# Patient Record
Sex: Female | Born: 1950 | Race: White | Hispanic: No | Smoking: Former smoker
Health system: Southern US, Community
[De-identification: ages and names within clinical notes are randomized; demographics above are authoritative.]

## PROBLEM LIST (undated history)

## (undated) DIAGNOSIS — F329 Major depressive disorder, single episode, unspecified: Secondary | ICD-10-CM

## (undated) DIAGNOSIS — B0221 Postherpetic geniculate ganglionitis: Secondary | ICD-10-CM

## (undated) DIAGNOSIS — I10 Essential (primary) hypertension: Secondary | ICD-10-CM

## (undated) DIAGNOSIS — E119 Type 2 diabetes mellitus without complications: Secondary | ICD-10-CM

## (undated) DIAGNOSIS — G43909 Migraine, unspecified, not intractable, without status migrainosus: Secondary | ICD-10-CM

## (undated) DIAGNOSIS — J449 Chronic obstructive pulmonary disease, unspecified: Secondary | ICD-10-CM

## (undated) HISTORY — DX: Major depressive disorder, single episode, unspecified: F32.9

## (undated) HISTORY — PX: LUMBAR PERITONEAL SHUNT: SHX1984

## (undated) HISTORY — PX: TYMPANOSTOMY TUBE PLACEMENT: SHX32

## (undated) HISTORY — DX: Chronic obstructive pulmonary disease, unspecified: J44.9

---

## 1972-09-11 DIAGNOSIS — F32A Depression, unspecified: Secondary | ICD-10-CM

## 1972-09-11 HISTORY — DX: Depression, unspecified: F32.A

## 2011-05-05 DIAGNOSIS — R918 Other nonspecific abnormal finding of lung field: Secondary | ICD-10-CM | POA: Insufficient documentation

## 2011-05-24 DIAGNOSIS — I10 Essential (primary) hypertension: Secondary | ICD-10-CM | POA: Insufficient documentation

## 2011-05-24 DIAGNOSIS — E119 Type 2 diabetes mellitus without complications: Secondary | ICD-10-CM | POA: Insufficient documentation

## 2011-06-05 DIAGNOSIS — J42 Unspecified chronic bronchitis: Secondary | ICD-10-CM | POA: Insufficient documentation

## 2011-09-27 DIAGNOSIS — G932 Benign intracranial hypertension: Secondary | ICD-10-CM | POA: Insufficient documentation

## 2011-09-27 DIAGNOSIS — E878 Other disorders of electrolyte and fluid balance, not elsewhere classified: Secondary | ICD-10-CM | POA: Insufficient documentation

## 2011-10-12 DIAGNOSIS — J309 Allergic rhinitis, unspecified: Secondary | ICD-10-CM | POA: Insufficient documentation

## 2011-12-12 DIAGNOSIS — B0222 Postherpetic trigeminal neuralgia: Secondary | ICD-10-CM | POA: Insufficient documentation

## 2011-12-22 DIAGNOSIS — R042 Hemoptysis: Secondary | ICD-10-CM | POA: Insufficient documentation

## 2012-03-28 DIAGNOSIS — E669 Obesity, unspecified: Secondary | ICD-10-CM | POA: Insufficient documentation

## 2012-04-08 DIAGNOSIS — H9012 Conductive hearing loss, unilateral, left ear, with unrestricted hearing on the contralateral side: Secondary | ICD-10-CM | POA: Insufficient documentation

## 2012-05-08 DIAGNOSIS — G245 Blepharospasm: Secondary | ICD-10-CM | POA: Insufficient documentation

## 2012-07-31 DIAGNOSIS — G510 Bell's palsy: Secondary | ICD-10-CM | POA: Insufficient documentation

## 2013-04-08 DIAGNOSIS — M6208 Separation of muscle (nontraumatic), other site: Secondary | ICD-10-CM | POA: Insufficient documentation

## 2013-11-17 DIAGNOSIS — L301 Dyshidrosis [pompholyx]: Secondary | ICD-10-CM | POA: Insufficient documentation

## 2013-12-02 DIAGNOSIS — Z800 Family history of malignant neoplasm of digestive organs: Secondary | ICD-10-CM | POA: Insufficient documentation

## 2014-03-13 DIAGNOSIS — M4310 Spondylolisthesis, site unspecified: Secondary | ICD-10-CM | POA: Insufficient documentation

## 2014-03-13 DIAGNOSIS — M8580 Other specified disorders of bone density and structure, unspecified site: Secondary | ICD-10-CM | POA: Insufficient documentation

## 2014-03-24 DIAGNOSIS — H6982 Other specified disorders of Eustachian tube, left ear: Secondary | ICD-10-CM | POA: Insufficient documentation

## 2014-03-24 DIAGNOSIS — H6992 Unspecified Eustachian tube disorder, left ear: Secondary | ICD-10-CM | POA: Insufficient documentation

## 2014-04-14 ENCOUNTER — Ambulatory Visit: Payer: Self-pay | Admitting: Family Medicine

## 2014-05-05 DIAGNOSIS — B0229 Other postherpetic nervous system involvement: Secondary | ICD-10-CM | POA: Insufficient documentation

## 2014-05-05 DIAGNOSIS — R29810 Facial weakness: Secondary | ICD-10-CM | POA: Insufficient documentation

## 2014-05-12 ENCOUNTER — Ambulatory Visit: Payer: Self-pay | Admitting: Family Medicine

## 2014-05-19 DIAGNOSIS — M25511 Pain in right shoulder: Secondary | ICD-10-CM | POA: Insufficient documentation

## 2014-05-19 DIAGNOSIS — M7521 Bicipital tendinitis, right shoulder: Secondary | ICD-10-CM | POA: Insufficient documentation

## 2014-06-11 ENCOUNTER — Ambulatory Visit: Payer: Self-pay | Admitting: Family Medicine

## 2014-07-06 DIAGNOSIS — M1A0710 Idiopathic chronic gout, right ankle and foot, without tophus (tophi): Secondary | ICD-10-CM | POA: Insufficient documentation

## 2014-07-12 ENCOUNTER — Ambulatory Visit: Payer: Self-pay | Admitting: Family Medicine

## 2014-08-11 ENCOUNTER — Ambulatory Visit: Payer: Self-pay | Admitting: Family Medicine

## 2014-10-12 ENCOUNTER — Ambulatory Visit: Payer: Self-pay | Admitting: Family Medicine

## 2014-11-10 ENCOUNTER — Ambulatory Visit
Admit: 2014-11-10 | Disposition: A | Discharge status: Home / Self Care | Payer: Self-pay | Attending: Family Medicine | Admitting: Family Medicine

## 2014-12-02 DIAGNOSIS — E049 Nontoxic goiter, unspecified: Secondary | ICD-10-CM | POA: Insufficient documentation

## 2014-12-02 DIAGNOSIS — Z1231 Encounter for screening mammogram for malignant neoplasm of breast: Secondary | ICD-10-CM | POA: Insufficient documentation

## 2014-12-15 ENCOUNTER — Ambulatory Visit
Admit: 2014-12-15 | Disposition: A | Discharge status: Home / Self Care | Payer: Self-pay | Attending: Family Medicine | Admitting: Family Medicine

## 2015-05-26 DIAGNOSIS — M47816 Spondylosis without myelopathy or radiculopathy, lumbar region: Secondary | ICD-10-CM | POA: Insufficient documentation

## 2015-05-26 DIAGNOSIS — M48061 Spinal stenosis, lumbar region without neurogenic claudication: Secondary | ICD-10-CM | POA: Insufficient documentation

## 2015-05-26 DIAGNOSIS — M9953 Intervertebral disc stenosis of neural canal of lumbar region: Secondary | ICD-10-CM | POA: Insufficient documentation

## 2015-05-26 DIAGNOSIS — M48062 Spinal stenosis, lumbar region with neurogenic claudication: Secondary | ICD-10-CM | POA: Insufficient documentation

## 2015-05-26 DIAGNOSIS — M722 Plantar fascial fibromatosis: Secondary | ICD-10-CM | POA: Insufficient documentation

## 2015-12-27 DIAGNOSIS — R21 Rash and other nonspecific skin eruption: Secondary | ICD-10-CM | POA: Insufficient documentation

## 2016-04-26 DIAGNOSIS — N950 Postmenopausal bleeding: Secondary | ICD-10-CM | POA: Insufficient documentation

## 2016-05-30 DIAGNOSIS — H7290 Unspecified perforation of tympanic membrane, unspecified ear: Secondary | ICD-10-CM | POA: Insufficient documentation

## 2017-05-04 DIAGNOSIS — M1812 Unilateral primary osteoarthritis of first carpometacarpal joint, left hand: Secondary | ICD-10-CM | POA: Insufficient documentation

## 2018-02-26 DIAGNOSIS — Z982 Presence of cerebrospinal fluid drainage device: Secondary | ICD-10-CM | POA: Insufficient documentation

## 2018-04-08 ENCOUNTER — Encounter: Payer: Medicare Other | Attending: Pulmonary Disease

## 2018-04-08 ENCOUNTER — Other Ambulatory Visit: Payer: Self-pay

## 2018-04-08 VITALS — Ht 65.1 in | Wt 210.8 lb

## 2018-04-08 DIAGNOSIS — Z87891 Personal history of nicotine dependence: Secondary | ICD-10-CM | POA: Insufficient documentation

## 2018-04-08 DIAGNOSIS — R0602 Shortness of breath: Secondary | ICD-10-CM | POA: Insufficient documentation

## 2018-04-08 NOTE — Progress Notes (Signed)
Pulmonary Individual Treatment Plan  Patient Details  Name: Susan Callahan MRN: [id number] Date of Birth: 1951-03-09 Referring Provider:     Pulmonary Rehab from 04/08/2018 in [facility] Cardiac and Pulmonary Rehab  Referring Provider  Jamesetta So MD      Initial Encounter Date:    Pulmonary Rehab from 04/08/2018 in [facility]  Date  04/08/18      Visit Diagnosis: Shortness of breath  Patient's Home Medications on Admission: No current outpatient medications on file.  Past Medical History: History reviewed. No pertinent past medical history.  Tobacco Use: Social History   Tobacco Use  Smoking Status Former Smoker  . Packs/day: 3.00  . Years: 27.00  . Pack years: 81.00  . Types: Cigarettes  . Last attempt to quit: 03/15/1991  . Years since quitting: 27.0  Smokeless Tobacco Never Used    Labs: Recent Review Flowsheet Data    There is no flowsheet data to display.       Pulmonary Assessment Scores: Pulmonary Assessment Scores    Row Name 04/08/18 1359         ADL UCSD   ADL Phase  Entry     SOB Score total  37     Rest  1     Walk  1     Stairs  4     Bath  0     Dress  0     Shop  2       CAT Score   CAT Score  19       mMRC Score   mMRC Score  1        Pulmonary Function Assessment: Pulmonary Function Assessment - 04/08/18 1402      Initial Spirometry Results   FVC%  99.5 %    FEV1%  80.5 %    FEV1/FVC Ratio  68    Comments  Test results from [facility] 04/03/18      Post Bronchodilator Spirometry Results   FVC%  99 %    FEV1%  85.5 %    FEV1/FVC Ratio  70    Comments  Test results from [facility] 04/03/18      Breath   Bilateral Breath Sounds  Clear    Shortness of Breath  Yes;Limiting activity       Exercise Target Goals: Date: 04/08/18  Exercise Program Goal: Individual exercise prescription set using results from initial 6 min walk test and THRR while considering  patient's activity barriers  and safety.    Exercise Prescription Goal: Initial exercise prescription builds to 30-45 minutes a day of aerobic activity, 2-3 days per week.  Home exercise guidelines will be given to patient during program as part of exercise prescription that the participant will acknowledge.  Activity Barriers & Risk Stratification: Activity Barriers & Cardiac Risk Stratification - 04/08/18 1432      Activity Barriers & Cardiac Risk Stratification   Activity Barriers  Balance Concerns;Muscular Weakness;Deconditioning;Shortness of Breath;Back Problems       6 Minute Walk: 6 Minute Walk    Row Name 04/08/18 1429         6 Minute Walk   Phase  Initial     Distance  1475 feet     Walk Time  6 minutes     # of Rest Breaks  0     MPH  2.79     METS  3.25     RPE  13  Perceived Dyspnea   3     VO2 Peak  11.39     Symptoms  Yes (comment)     Comments  SOB     Resting HR  68 bpm     Resting BP  144/72     Resting Oxygen Saturation   95 %     Exercise Oxygen Saturation  during 6 min walk  87 %     Max Ex. HR  112 bpm     Max Ex. BP  154/86     2 Minute Post BP  146/80       Interval HR   1 Minute HR  83     2 Minute HR  108     3 Minute HR  106     4 Minute HR  111     5 Minute HR  114     6 Minute HR  112     2 Minute Post HR  81     Interval Heart Rate?  Yes       Interval Oxygen   Interval Oxygen?  Yes     Baseline Oxygen Saturation %  95 %     1 Minute Oxygen Saturation %  91 %     1 Minute Liters of Oxygen  0 L Room Air     2 Minute Oxygen Saturation %  91 %     2 Minute Liters of Oxygen  0 L     3 Minute Oxygen Saturation %  87 %     3 Minute Liters of Oxygen  0 L     4 Minute Oxygen Saturation %  93 %     4 Minute Liters of Oxygen  0 L     5 Minute Oxygen Saturation %  93 %     5 Minute Liters of Oxygen  0 L     6 Minute Oxygen Saturation %  94 %     6 Minute Liters of Oxygen  0 L     2 Minute Post Oxygen Saturation %  96 %     2 Minute Post Liters of Oxygen  0 L        Oxygen Initial Assessment: Oxygen Initial Assessment - 04/08/18 1430      Home Oxygen   Home Oxygen Device  None    Sleep Oxygen Prescription  None    Home Exercise Oxygen Prescription  None    Home at Rest Exercise Oxygen Prescription  None      Initial 6 min Walk   Oxygen Used  None      Program Oxygen Prescription   Program Oxygen Prescription  None      Intervention   Short Term Goals  To learn and understand importance of maintaining oxygen saturations>88%;To learn and demonstrate proper pursed lip breathing techniques or other breathing techniques.;To learn and demonstrate proper use of respiratory medications;To learn and understand importance of monitoring SPO2 with pulse oximeter and demonstrate accurate use of the pulse oximeter. Her inhaler was 300 dollars and cannot afford her inhaler.    Long  Term Goals  Verbalizes importance of monitoring SPO2 with pulse oximeter and return demonstration;Maintenance of O2 saturations>88%;Exhibits proper breathing techniques, such as pursed lip breathing or other method taught during program session;Demonstrates proper use of MDI's;Compliance with respiratory medication       Oxygen Re-Evaluation:   Oxygen Discharge (Final Oxygen Re-Evaluation):   Initial Exercise Prescription: Initial Exercise Prescription -  04/08/18 1400      Date of Initial Exercise RX and Referring Provider   Date  04/08/18    Referring Provider  Jamesetta So MD      Treadmill   MPH  2.5    Grade  1    Minutes  15    METs  3.26      Recumbant Bike   Level  2    RPM  50    Watts  38    Minutes  15    METs  3.2      NuStep   Level  3    SPM  80    Minutes  15    METs  3      Prescription Details   Frequency (times per week)  3    Duration  Progress to 45 minutes of aerobic exercise without signs/symptoms of physical distress      Intensity   THRR 40-80% of Max Heartrate  102-136    Ratings of Perceived Exertion  11-13     Perceived Dyspnea  0-4      Progression   Progression  Continue to progress workloads to maintain intensity without signs/symptoms of physical distress.      Resistance Training   Training Prescription  Yes    Weight  3 lbs    Reps  10-15       Perform Capillary Blood Glucose checks as needed.  Exercise Prescription Changes: Exercise Prescription Changes    Row Name 04/08/18 1400             Response to Exercise   Blood Pressure (Admit)  144/72       Blood Pressure (Exercise)  154/86       Blood Pressure (Exit)  146/80       Heart Rate (Admit)  68 bpm       Heart Rate (Exercise)  114 bpm       Heart Rate (Exit)  81 bpm       Oxygen Saturation (Admit)  95 %       Oxygen Saturation (Exercise)  87 %       Oxygen Saturation (Exit)  96 %       Rating of Perceived Exertion (Exercise)  13       Perceived Dyspnea (Exercise)  3       Symptoms  SOB       Comments  walk test results          Exercise Comments:   Exercise Goals and Review: Exercise Goals    Row Name 04/08/18 1435             Exercise Goals   Increase Physical Activity  Yes       Intervention  Provide advice, education, support and counseling about physical activity/exercise needs.;Develop an individualized exercise prescription for aerobic and resistive training based on initial evaluation findings, risk stratification, comorbidities and participant's personal goals.       Expected Outcomes  Short Term: Attend rehab on a regular basis to increase amount of physical activity.;Long Term: Add in home exercise to make exercise part of routine and to increase amount of physical activity.;Long Term: Exercising regularly at least 3-5 days a week.       Increase Strength and Stamina  Yes       Intervention  Provide advice, education, support and counseling about physical activity/exercise needs.;Develop an individualized exercise prescription for aerobic and resistive training based on initial  evaluation findings,  risk stratification, comorbidities and participant's personal goals.       Expected Outcomes  Short Term: Increase workloads from initial exercise prescription for resistance, speed, and METs.;Short Term: Perform resistance training exercises routinely during rehab and add in resistance training at home;Long Term: Improve cardiorespiratory fitness, muscular endurance and strength as measured by increased METs and functional capacity (6MWT)       Able to understand and use rate of perceived exertion (RPE) scale  Yes       Intervention  Provide education and explanation on how to use RPE scale       Expected Outcomes  Short Term: Able to use RPE daily in rehab to express subjective intensity level;Long Term:  Able to use RPE to guide intensity level when exercising independently       Able to understand and use Dyspnea scale  Yes       Intervention  Provide education and explanation on how to use Dyspnea scale       Expected Outcomes  Short Term: Able to use Dyspnea scale daily in rehab to express subjective sense of shortness of breath during exertion;Long Term: Able to use Dyspnea scale to guide intensity level when exercising independently       Knowledge and understanding of Target Heart Rate Range (THRR)  Yes       Intervention  Provide education and explanation of THRR including how the numbers were predicted and where they are located for reference       Expected Outcomes  Short Term: Able to state/look up THRR;Short Term: Able to use daily as guideline for intensity in rehab;Long Term: Able to use THRR to govern intensity when exercising independently       Able to check pulse independently  Yes       Intervention  Provide education and demonstration on how to check pulse in carotid and radial arteries.;Review the importance of being able to check your own pulse for safety during independent exercise       Expected Outcomes  Short Term: Able to explain why pulse checking is important during  independent exercise;Long Term: Able to check pulse independently and accurately       Understanding of Exercise Prescription  Yes       Intervention  Provide education, explanation, and written materials on patient's individual exercise prescription       Expected Outcomes  Short Term: Able to explain program exercise prescription;Long Term: Able to explain home exercise prescription to exercise independently          Exercise Goals Re-Evaluation :   Discharge Exercise Prescription (Final Exercise Prescription Changes): Exercise Prescription Changes - 04/08/18 1400      Response to Exercise   Blood Pressure (Admit)  144/72    Blood Pressure (Exercise)  154/86    Blood Pressure (Exit)  146/80    Heart Rate (Admit)  68 bpm    Heart Rate (Exercise)  114 bpm    Heart Rate (Exit)  81 bpm    Oxygen Saturation (Admit)  95 %    Oxygen Saturation (Exercise)  87 %    Oxygen Saturation (Exit)  96 %    Rating of Perceived Exertion (Exercise)  13    Perceived Dyspnea (Exercise)  3    Symptoms  SOB    Comments  walk test results       Nutrition:  Target Goals: Understanding of nutrition guidelines, daily intake of sodium <1562m, cholesterol <2042m calories  30% from fat and 7% or less from saturated fats, daily to have 5 or more servings of fruits and vegetables.  Biometrics: Pre Biometrics - 04/08/18 1435      Pre Biometrics   Height  5' 5.1" (1.654 m)    Weight  210 lb 12.8 oz (95.6 kg)    Waist Circumference  43 inches    Hip Circumference  44 inches    Waist to Hip Ratio  0.98 %    BMI (Calculated)  34.95    Single Leg Stand  0.72 seconds        Nutrition Therapy Plan and Nutrition Goals: Nutrition Therapy & Goals - 04/08/18 1424      Personal Nutrition Goals   Nutrition Goal  Lose some weight.    Personal Goal #2  Find a diet that can fit her budget.    Comments  She would like to meet with the dietician. She wants something to fit her budget so she can lose more  weight.      Intervention Plan   Intervention  Prescribe, educate and counsel regarding individualized specific dietary modifications aiming towards targeted core components such as weight, hypertension, lipid management, diabetes, heart failure and other comorbidities.    Expected Outcomes  Short Term Goal: Understand basic principles of dietary content, such as calories, fat, sodium, cholesterol and nutrients.;Long Term Goal: Adherence to prescribed nutrition plan.       Nutrition Assessments: Nutrition Assessments - 04/08/18 1359      MEDFICTS Scores   Pre Score  28       Nutrition Goals Re-Evaluation:   Nutrition Goals Discharge (Final Nutrition Goals Re-Evaluation):   Psychosocial: Target Goals: Acknowledge presence or absence of significant depression and/or stress, maximize coping skills, provide positive support system. Participant is able to verbalize types and ability to use techniques and skills needed for reducing stress and depression.   Initial Review & Psychosocial Screening: Initial Psych Review & Screening - 04/08/18 1422      Initial Review   Current issues with  Current Depression;History of Depression;Current Stress Concerns    Source of Stress Concerns  Financial;Retirement/disability;Unable to perform yard/household activities    Comments  She has not planed to be this disabled when she is retired. She is unable to do yardwork and do the things she used to do.      Family Dynamics   Good Support System?  No    Concerns  No support system    Comments  She states her sister is sort of there but has health issues and things she is dealing with.      Barriers   Psychosocial barriers to participate in program  The patient should benefit from training in stress management and relaxation.      Screening Interventions   Interventions  Encouraged to exercise;Program counselor consult;To provide support and resources with identified psychosocial needs;Provide  feedback about the scores to participant    Expected Outcomes  Short Term goal: Utilizing psychosocial counselor, staff and physician to assist with identification of specific Stressors or current issues interfering with healing process. Setting desired goal for each stressor or current issue identified.;Long Term Goal: Stressors or current issues are controlled or eliminated.;Short Term goal: Identification and review with participant of any Quality of Life or Depression concerns found by scoring the questionnaire.;Long Term goal: The participant improves quality of Life and PHQ9 Scores as seen by post scores and/or verbalization of changes  Quality of Life Scores:  Scores of 19 and below usually indicate a poorer quality of life in these areas.  A difference of  2-3 points is a clinically meaningful difference.  A difference of 2-3 points in the total score of the Quality of Life Index has been associated with significant improvement in overall quality of life, self-image, physical symptoms, and general health in studies assessing change in quality of life.  PHQ-9: Recent Review Flowsheet Data    Depression screen [facility] 2/9 04/08/2018   Decreased Interest 1   Down, Depressed, Hopeless 1   PHQ - 2 Score 2   Altered sleeping 0   Tired, decreased energy 2   Change in appetite 1   Feeling bad or failure about yourself  1   Trouble concentrating 0   Moving slowly or fidgety/restless 0   Suicidal thoughts 0   PHQ-9 Score 6   Difficult doing work/chores Somewhat difficult     Interpretation of Total Score  Total Score Depression Severity:  1-4 = Minimal depression, 5-9 = Mild depression, 10-14 = Moderate depression, 15-19 = Moderately severe depression, 20-27 = Severe depression   Psychosocial Evaluation and Intervention:   Psychosocial Re-Evaluation:   Psychosocial Discharge (Final Psychosocial Re-Evaluation):   Education: Education Goals: Education classes will be provided on a  weekly basis, covering required topics. Participant will state understanding/return demonstration of topics presented.  Learning Barriers/Preferences: Learning Barriers/Preferences - 04/08/18 1428      Learning Barriers/Preferences   Learning Barriers  None    Learning Preferences  None       Education Topics:  Initial Evaluation Education: - Verbal, written and demonstration of respiratory meds, oximetry and breathing techniques. Instruction on use of nebulizers and MDIs and importance of monitoring MDI activations.   Pulmonary Rehab from 04/08/2018 in [facility] Cardiac and Pulmonary Rehab  Date  04/08/18  Educator  [facility]  Instruction Review Code  1- Verbalizes Understanding      General Nutrition Guidelines/Fats and Fiber: -Group instruction provided by verbal, written material, models and posters to present the general guidelines for heart healthy nutrition. Gives an explanation and review of dietary fats and fiber.   Controlling Sodium/Reading Food Labels: -Group verbal and written material supporting the discussion of sodium use in heart healthy nutrition. Review and explanation with models, verbal and written materials for utilization of the food label.   Exercise Physiology & General Exercise Guidelines: - Group verbal and written instruction with models to review the exercise physiology of the cardiovascular system and associated critical values. Provides general exercise guidelines with specific guidelines to those with heart or lung disease.    Aerobic Exercise & Resistance Training: - Gives group verbal and written instruction on the various components of exercise. Focuses on aerobic and resistive training programs and the benefits of this training and how to safely progress through these programs.   Flexibility, Balance, Mind/Body Relaxation: Provides group verbal/written instruction on the benefits of flexibility and balance training, including mind/body exercise modes such  as yoga, pilates and tai chi.  Demonstration and skill practice provided.   Stress and Anxiety: - Provides group verbal and written instruction about the health risks of elevated stress and causes of high stress.  Discuss the correlation between heart/lung disease and anxiety and treatment options. Review healthy ways to manage with stress and anxiety.   Depression: - Provides group verbal and written instruction on the correlation between heart/lung disease and depressed mood, treatment options, and the stigmas associated with seeking  treatment.   Exercise & Equipment Safety: - Individual verbal instruction and demonstration of equipment use and safety with use of the equipment.   Pulmonary Rehab from 04/08/2018 in [facility] Cardiac and Pulmonary Rehab  Date  04/08/18  Educator  [facility]  Instruction Review Code  1- Verbalizes Understanding      Infection Prevention: - Provides verbal and written material to individual with discussion of infection control including proper hand washing and proper equipment cleaning during exercise session.   Pulmonary Rehab from 04/08/2018 in [facility] Cardiac and Pulmonary Rehab  Date  04/08/18  Educator  [facility]  Instruction Review Code  1- Verbalizes Understanding      Falls Prevention: - Provides verbal and written material to individual with discussion of falls prevention and safety.   Pulmonary Rehab from 04/08/2018 in [facility] Cardiac and Pulmonary Rehab  Date  04/08/18  Educator  [facility]  Instruction Review Code  1- Verbalizes Understanding      Diabetes: - Individual verbal and written instruction to review signs/symptoms of diabetes, desired ranges of glucose level fasting, after meals and with exercise. Advice that pre and post exercise glucose checks will be done for 3 sessions at entry of program.   Chronic Lung Diseases: - Group verbal and written instruction to review updates, respiratory medications, advancements in procedures and treatments. Discuss use of  supplemental oxygen including available portable oxygen systems, continuous and intermittent flow rates, concentrators, personal use and safety guidelines. Review proper use of inhaler and spacers. Provide informative websites for self-education.    Energy Conservation: - Provide group verbal and written instruction for methods to conserve energy, plan and organize activities. Instruct on pacing techniques, use of adaptive equipment and posture/positioning to relieve shortness of breath.   Triggers and Exacerbations: - Group verbal and written instruction to review types of environmental triggers and ways to prevent exacerbations. Discuss weather changes, air quality and the benefits of nasal washing. Review warning signs and symptoms to help prevent infections. Discuss techniques for effective airway clearance, coughing, and vibrations.   AED/CPR: - Group verbal and written instruction with the use of models to demonstrate the basic use of the AED with the basic ABC's of resuscitation.   Anatomy and Physiology of the Lungs: - Group verbal and written instruction with the use of models to provide basic lung anatomy and physiology related to function, structure and complications of lung disease.   Anatomy & Physiology of the Heart: - Group verbal and written instruction and models provide basic cardiac anatomy and physiology, with the coronary electrical and arterial systems. Review of Valvular disease and Heart Failure   Cardiac Medications: - Group verbal and written instruction to review commonly prescribed medications for heart disease. Reviews the medication, class of the drug, and side effects.   Know Your Numbers and Risk Factors: -Group verbal and written instruction about important numbers in your health.  Discussion of what are risk factors and how they play a role in the disease process.  Review of Cholesterol, Blood Pressure, Diabetes, and BMI and the role they play in your  overall health.   Sleep Hygiene: -Provides group verbal and written instruction about how sleep can affect your health.  Define sleep hygiene, discuss sleep cycles and impact of sleep habits. Review good sleep hygiene tips.    Other: -Provides group and verbal instruction on various topics (see comments)    Knowledge Questionnaire Score: Knowledge Questionnaire Score - 04/08/18 1359      Knowledge Questionnaire Score  Pre Score  15/18 reviewed with patient        Core Components/Risk Factors/Patient Goals at Admission: Personal Goals and Risk Factors at Admission - 04/08/18 1432      Core Components/Risk Factors/Patient Goals on Admission    Weight Management  Yes;Weight Loss;Obesity    Intervention  Weight Management: Develop a combined nutrition and exercise program designed to reach desired caloric intake, while maintaining appropriate intake of nutrient and fiber, sodium and fats, and appropriate energy expenditure required for the weight goal.;Weight Management: Provide education and appropriate resources to help participant work on and attain dietary goals.;Weight Management/Obesity: Establish reasonable short term and long term weight goals.;Obesity: Provide education and appropriate resources to help participant work on and attain dietary goals.    Admit Weight  210 lb 12.8 oz (95.6 kg)    Goal Weight: Short Term  205 lb (93 kg)    Goal Weight: Long Term  150 lb (68 kg)    Expected Outcomes  Short Term: Continue to assess and modify interventions until short term weight is achieved;Long Term: Adherence to nutrition and physical activity/exercise program aimed toward attainment of established weight goal;Weight Maintenance: Understanding of the daily nutrition guidelines, which includes 25-35% calories from fat, 7% or less cal from saturated fats, less than 216m cholesterol, less than 1.5gm of sodium, & 5 or more servings of fruits and vegetables daily;Weight Loss: Understanding  of general recommendations for a balanced deficit meal plan, which promotes 1-2 lb weight loss per week and includes a negative energy balance of [phone] kcal/d;Understanding recommendations for meals to include 15-35% energy as protein, 25-35% energy from fat, 35-60% energy from carbohydrates, less than 2041mof dietary cholesterol, 20-35 gm of total fiber daily;Understanding of distribution of calorie intake throughout the day with the consumption of 4-5 meals/snacks    Improve shortness of breath with ADL's  Yes    Intervention  Provide education, individualized exercise plan and daily activity instruction to help decrease symptoms of SOB with activities of daily living.    Expected Outcomes  Short Term: Improve cardiorespiratory fitness to achieve a reduction of symptoms when performing ADLs;Long Term: Be able to perform more ADLs without symptoms or delay the onset of symptoms    Diabetes  Yes    Intervention  Provide education about signs/symptoms and action to take for hypo/hyperglycemia.;Provide education about proper nutrition, including hydration, and aerobic/resistive exercise prescription along with prescribed medications to achieve blood glucose in normal ranges: Fasting glucose 65-99 mg/dL    Expected Outcomes  Short Term: Participant verbalizes understanding of the signs/symptoms and immediate care of hyper/hypoglycemia, proper foot care and importance of medication, aerobic/resistive exercise and nutrition plan for blood glucose control.;Long Term: Attainment of HbA1C < 7%.    Lipids  Yes    Intervention  Provide education and support for participant on nutrition & aerobic/resistive exercise along with prescribed medications to achieve LDL <7085mHDL >78m19m  Expected Outcomes  Short Term: Participant states understanding of desired cholesterol values and is compliant with medications prescribed. Participant is following exercise prescription and nutrition guidelines.;Long Term:  Cholesterol controlled with medications as prescribed, with individualized exercise RX and with personalized nutrition plan. Value goals: LDL < 70mg22mL > 40 mg.       Core Components/Risk Factors/Patient Goals Review:    Core Components/Risk Factors/Patient Goals at Discharge (Final Review):    ITP Comments: ITP Comments    Row Name 04/08/18 1339  ITP Comments  Medical Evaluation completed. Chart sent for review and changes to Dr. Emily Filbert Director of [place]. Diagnosis can be found in [facility] encounter 01/31/18          Comments: Initial ITP

## 2018-04-08 NOTE — Patient Instructions (Signed)
Patient Instructions  Patient Details  Name: Susan Callahan MRN: [id number] Date of Birth: 1951-04-10 Referring Provider:  Jamesetta So, MD  Below are your personal goals for exercise, nutrition, and risk factors. Our goal is to help you stay on track towards obtaining and maintaining these goals. We will be discussing your progress on these goals with you throughout the program.  Initial Exercise Prescription: Initial Exercise Prescription - 04/08/18 1400      Date of Initial Exercise RX and Referring Provider   Date  04/08/18    Referring Provider  Jamesetta So MD      Treadmill   MPH  2.5    Grade  1    Minutes  15    METs  3.26      Recumbant Bike   Level  2    RPM  50    Watts  38    Minutes  15    METs  3.2      NuStep   Level  3    SPM  80    Minutes  15    METs  3      Prescription Details   Frequency (times per week)  3    Duration  Progress to 45 minutes of aerobic exercise without signs/symptoms of physical distress      Intensity   THRR 40-80% of Max Heartrate  102-136    Ratings of Perceived Exertion  11-13    Perceived Dyspnea  0-4      Progression   Progression  Continue to progress workloads to maintain intensity without signs/symptoms of physical distress.      Resistance Training   Training Prescription  Yes    Weight  3 lbs    Reps  10-15       Exercise Goals: Frequency: Be able to perform aerobic exercise two to three times per week in program working toward 2-5 days per week of home exercise.  Intensity: Work with a perceived exertion of 11 (fairly light) - 15 (hard) while following your exercise prescription.  We will make changes to your prescription with you as you progress through the program.   Duration: Be able to do 30 to 45 minutes of continuous aerobic exercise in addition to a 5 minute warm-up and a 5 minute cool-down routine.   Nutrition Goals: Your personal nutrition goals will be established when you do your  nutrition analysis with the dietician.  The following are general nutrition guidelines to follow: Cholesterol < 200mg /day Sodium < 1500mg /day Fiber: Women over 50 yrs - 21 grams per day  Personal Goals: Personal Goals and Risk Factors at Admission - 04/08/18 1432      Core Components/Risk Factors/Patient Goals on Admission    Weight Management  Yes;Weight Loss;Obesity    Intervention  Weight Management: Develop a combined nutrition and exercise program designed to reach desired caloric intake, while maintaining appropriate intake of nutrient and fiber, sodium and fats, and appropriate energy expenditure required for the weight goal.;Weight Management: Provide education and appropriate resources to help participant work on and attain dietary goals.;Weight Management/Obesity: Establish reasonable short term and long term weight goals.;Obesity: Provide education and appropriate resources to help participant work on and attain dietary goals.    Admit Weight  210 lb 12.8 oz (95.6 kg)    Goal Weight: Short Term  205 lb (93 kg)    Goal Weight: Long Term  150 lb (68 kg)    Expected Outcomes  Short  Term: Continue to assess and modify interventions until short term weight is achieved;Long Term: Adherence to nutrition and physical activity/exercise program aimed toward attainment of established weight goal;Weight Maintenance: Understanding of the daily nutrition guidelines, which includes 25-35% calories from fat, 7% or less cal from saturated fats, less than 200mg  cholesterol, less than 1.5gm of sodium, & 5 or more servings of fruits and vegetables daily;Weight Loss: Understanding of general recommendations for a balanced deficit meal plan, which promotes 1-2 lb weight loss per week and includes a negative energy balance of [phone] kcal/d;Understanding recommendations for meals to include 15-35% energy as protein, 25-35% energy from fat, 35-60% energy from carbohydrates, less than 200mg  of dietary  cholesterol, 20-35 gm of total fiber daily;Understanding of distribution of calorie intake throughout the day with the consumption of 4-5 meals/snacks    Improve shortness of breath with ADL's  Yes    Intervention  Provide education, individualized exercise plan and daily activity instruction to help decrease symptoms of SOB with activities of daily living.    Expected Outcomes  Short Term: Improve cardiorespiratory fitness to achieve a reduction of symptoms when performing ADLs;Long Term: Be able to perform more ADLs without symptoms or delay the onset of symptoms    Diabetes  Yes    Intervention  Provide education about signs/symptoms and action to take for hypo/hyperglycemia.;Provide education about proper nutrition, including hydration, and aerobic/resistive exercise prescription along with prescribed medications to achieve blood glucose in normal ranges: Fasting glucose 65-99 mg/dL    Expected Outcomes  Short Term: Participant verbalizes understanding of the signs/symptoms and immediate care of hyper/hypoglycemia, proper foot care and importance of medication, aerobic/resistive exercise and nutrition plan for blood glucose control.;Long Term: Attainment of HbA1C < 7%.    Lipids  Yes    Intervention  Provide education and support for participant on nutrition & aerobic/resistive exercise along with prescribed medications to achieve LDL 70mg , HDL >40mg .    Expected Outcomes  Short Term: Participant states understanding of desired cholesterol values and is compliant with medications prescribed. Participant is following exercise prescription and nutrition guidelines.;Long Term: Cholesterol controlled with medications as prescribed, with individualized exercise RX and with personalized nutrition plan. Value goals: LDL < 70mg , HDL > 40 mg.       Tobacco Use Initial Evaluation: Social History   Tobacco Use  Smoking Status Former Smoker  . Packs/day: 3.00  . Years: 27.00  . Pack years: 81.00  .  Types: Cigarettes  . Last attempt to quit: 03/15/1991  . Years since quitting: 27.0  Smokeless Tobacco Never Used    Exercise Goals and Review: Exercise Goals    Row Name 04/08/18 1435             Exercise Goals   Increase Physical Activity  Yes       Intervention  Provide advice, education, support and counseling about physical activity/exercise needs.;Develop an individualized exercise prescription for aerobic and resistive training based on initial evaluation findings, risk stratification, comorbidities and participant's personal goals.       Expected Outcomes  Short Term: Attend rehab on a regular basis to increase amount of physical activity.;Long Term: Add in home exercise to make exercise part of routine and to increase amount of physical activity.;Long Term: Exercising regularly at least 3-5 days a week.       Increase Strength and Stamina  Yes       Intervention  Provide advice, education, support and counseling about physical activity/exercise needs.;Develop an individualized exercise prescription  for aerobic and resistive training based on initial evaluation findings, risk stratification, comorbidities and participant's personal goals.       Expected Outcomes  Short Term: Increase workloads from initial exercise prescription for resistance, speed, and METs.;Short Term: Perform resistance training exercises routinely during rehab and add in resistance training at home;Long Term: Improve cardiorespiratory fitness, muscular endurance and strength as measured by increased METs and functional capacity (6MWT)       Able to understand and use rate of perceived exertion (RPE) scale  Yes       Intervention  Provide education and explanation on how to use RPE scale       Expected Outcomes  Short Term: Able to use RPE daily in rehab to express subjective intensity level;Long Term:  Able to use RPE to guide intensity level when exercising independently       Able to understand and use Dyspnea  scale  Yes       Intervention  Provide education and explanation on how to use Dyspnea scale       Expected Outcomes  Short Term: Able to use Dyspnea scale daily in rehab to express subjective sense of shortness of breath during exertion;Long Term: Able to use Dyspnea scale to guide intensity level when exercising independently       Knowledge and understanding of Target Heart Rate Range (THRR)  Yes       Intervention  Provide education and explanation of THRR including how the numbers were predicted and where they are located for reference       Expected Outcomes  Short Term: Able to state/look up THRR;Short Term: Able to use daily as guideline for intensity in rehab;Long Term: Able to use THRR to govern intensity when exercising independently       Able to check pulse independently  Yes       Intervention  Provide education and demonstration on how to check pulse in carotid and radial arteries.;Review the importance of being able to check your own pulse for safety during independent exercise       Expected Outcomes  Short Term: Able to explain why pulse checking is important during independent exercise;Long Term: Able to check pulse independently and accurately       Understanding of Exercise Prescription  Yes       Intervention  Provide education, explanation, and written materials on patient's individual exercise prescription       Expected Outcomes  Short Term: Able to explain program exercise prescription;Long Term: Able to explain home exercise prescription to exercise independently          Copy of goals given to participant.

## 2018-04-08 NOTE — Progress Notes (Signed)
Daily Session Note  Patient Details  Name: Susan Callahan MRN: [id number] Date of Birth: 12-12-1950 Referring Provider:     Pulmonary Rehab from 04/08/2018 in [facility] Cardiac and Pulmonary Rehab  Referring Provider  Jamesetta So MD      Encounter Date: 04/08/2018  Check In: Session Check In - 04/08/18 1338      Check-In   Supervising physician immediately available to respond to emergencies  [facility] immediately available ER MD    Physician(s)  Dr. Quentin Cornwall and [facility]    Location  [facility]-Cardiac & Pulmonary Rehab    Staff Present  Justin Mend RCP,RRT,BSRT;Jessica Luan [place], [place], RCEP, CCRP, Exercise Physiologist    Medication changes reported      No    Fall or balance concerns reported     No    Warm-up and Cool-down  Not performed (comment) medical evaluation    Resistance Training Performed  No    VAD Patient?  No      Pain Assessment   Currently in Pain?  No/denies        Exercise Prescription Changes - 04/08/18 1400      Response to Exercise   Blood Pressure (Admit)  144/72    Blood Pressure (Exercise)  154/86    Blood Pressure (Exit)  146/80    Heart Rate (Admit)  68 bpm    Heart Rate (Exercise)  114 bpm    Heart Rate (Exit)  81 bpm    Oxygen Saturation (Admit)  95 %    Oxygen Saturation (Exercise)  87 %    Oxygen Saturation (Exit)  96 %    Rating of Perceived Exertion (Exercise)  13    Perceived Dyspnea (Exercise)  3    Symptoms  SOB    Comments  walk test results       Social History   Tobacco Use  Smoking Status Former Smoker  . Packs/day: 3.00  . Years: 27.00  . Pack years: 81.00  . Types: Cigarettes  . Last attempt to quit: 03/15/1991  . Years since quitting: 27.0  Smokeless Tobacco Never Used    Goals Met:  Exercise tolerated well Personal goals reviewed No report of cardiac concerns or symptoms Strength training completed today  Goals Unmet:  Not Applicable  Comments: Service time 1330-1445.   Dr. Emily Filbert is Medical Director  for [place] and [facility].

## 2018-04-15 ENCOUNTER — Encounter: Payer: Medicare Other | Attending: Pulmonary Disease | Admitting: *Deleted

## 2018-04-15 DIAGNOSIS — R0602 Shortness of breath: Secondary | ICD-10-CM | POA: Insufficient documentation

## 2018-04-15 DIAGNOSIS — Z87891 Personal history of nicotine dependence: Secondary | ICD-10-CM | POA: Insufficient documentation

## 2018-04-15 LAB — GLUCOSE, CAPILLARY
Glucose-Capillary: 131 mg/dL — ABNORMAL HIGH (ref 70–99)
Glucose-Capillary: 102 mg/dL — ABNORMAL HIGH (ref 70–99)

## 2018-04-15 NOTE — Progress Notes (Signed)
Daily Session Note  Patient Details  Name: Susan Callahan MRN: [id number] Date of Birth: 1951/01/06 Referring Provider:     Pulmonary Rehab from 04/08/2018 in [facility] Cardiac and Pulmonary Rehab  Referring Provider  Jamesetta So MD      Encounter Date: 04/15/2018  Check In: Session Check In - 04/15/18 1021      Check-In   Supervising physician immediately available to respond to emergencies  See telemetry face sheet for immediately available ER MD    Physician(s)  Dr. Joni Fears and Dr. Corky Downs    Location  [facility]-Cardiac & Pulmonary Rehab    Staff Present  Justin Mend RCP,RRT,BSRT;Amanda Oletta Darter, [place], ACSM CEP, Exercise Physiologist;Lincoln Ginley Amedeo Plenty, BS, ACSM CEP, Exercise Physiologist    Medication changes reported      No    Fall or balance concerns reported     No    Tobacco Cessation  No Change    Warm-up and Cool-down  Performed as group-led instruction    Resistance Training Performed  No    VAD Patient?  No    PAD/SET Patient?  No      Pain Assessment   Currently in Pain?  No/denies    Multiple Pain Sites  No          Social History   Tobacco Use  Smoking Status Former Smoker  . Packs/day: 3.00  . Years: 27.00  . Pack years: 81.00  . Types: Cigarettes  . Last attempt to quit: 03/15/1991  . Years since quitting: 27.1  Smokeless Tobacco Never Used    Goals Met:  Proper associated with RPD/PD & O2 Sat Exercise tolerated well Personal goals reviewed No report of cardiac concerns or symptoms Strength training completed today  Goals Unmet:  Not Applicable  Comments: First full day of exercise!  Patient was oriented to gym and equipment including functions, settings, policies, and procedures.  Patient's individual exercise prescription and treatment plan were reviewed.  All starting workloads were established based on the results of the 6 minute walk test done at initial orientation visit.  The plan for exercise progression was also introduced and progression will be  customized based on patient's performance and goals.    Dr. Emily Filbert is Medical Director for [place] and [facility].

## 2018-04-17 DIAGNOSIS — R0602 Shortness of breath: Secondary | ICD-10-CM

## 2018-04-17 LAB — GLUCOSE, CAPILLARY
GLUCOSE-CAPILLARY: 107 mg/dL — AB (ref 70–99)
Glucose-Capillary: 112 mg/dL — ABNORMAL HIGH (ref 70–99)

## 2018-04-17 NOTE — Progress Notes (Signed)
Daily Session Note  Patient Details  Name: Susan Callahan MRN: [id number] Date of Birth: 12-30-1950 Referring Provider:     Pulmonary Rehab from 04/08/2018 in [facility] Cardiac and Pulmonary Rehab  Referring Provider  Jamesetta So MD      Encounter Date: 04/17/2018  Check In: Session Check In - 04/17/18 1034      Check-In   Supervising physician immediately available to respond to emergencies  [facility] immediately available ER MD    Physician(s)  Lucita Lora and [place]    Location  [facility]-Cardiac & Pulmonary Rehab    Staff Present  Justin Mend Lorre Nick, MA, RCEP, CCRP, Exercise Physiologist;Djuana [place]leton Oletta Darter, [place], ACSM CEP, Exercise Physiologist    Medication changes reported      No    Fall or balance concerns reported     No    Warm-up and Cool-down  Performed as group-led instruction    Resistance Training Performed  Yes    VAD Patient?  No    PAD/SET Patient?  No      Pain Assessment   Currently in Pain?  No/denies    Multiple Pain Sites  No          Social History   Tobacco Use  Smoking Status Former Smoker  . Packs/day: 3.00  . Years: 27.00  . Pack years: 81.00  . Types: Cigarettes  . Last attempt to quit: 03/15/1991  . Years since quitting: 27.1  Smokeless Tobacco Never Used    Goals Met:  Proper associated with RPD/PD & O2 Sat Independence with exercise equipment Exercise tolerated well No report of cardiac concerns or symptoms Strength training completed today  Goals Unmet:  Not Applicable  Comments: Pt able to follow exercise prescription today without complaint.  Will continue to monitor for progression.    Dr. Emily Filbert is Medical Director for [place] and [facility].

## 2018-04-19 ENCOUNTER — Encounter: Payer: Medicare Other | Admitting: *Deleted

## 2018-04-19 DIAGNOSIS — R0602 Shortness of breath: Secondary | ICD-10-CM

## 2018-04-19 LAB — GLUCOSE, CAPILLARY
GLUCOSE-CAPILLARY: 133 mg/dL — AB (ref 70–99)
GLUCOSE-CAPILLARY: 98 mg/dL (ref 70–99)

## 2018-04-19 NOTE — Progress Notes (Signed)
Daily Session Note  Patient Details  Name: Susan Callahan MRN: [id number] Date of Birth: August 24, 1951 Referring Provider:     Pulmonary Rehab from 04/08/2018 in [facility] Cardiac and Pulmonary Rehab  Referring Provider  Jamesetta So MD      Encounter Date: 04/19/2018  Check In: Session Check In - 04/19/18 1009      Check-In   Supervising physician immediately available to respond to emergencies  [facility] immediately available ER MD    Physician(s)  Dr. Alfred Levins and [facility]    Location  [facility]-Cardiac & Pulmonary Rehab    Staff Present  Renita Papa, RN BSN;Joseph [facility], [place], ACSM CEP, Exercise Physiologist    Medication changes reported      No    Fall or balance concerns reported     No    Tobacco Cessation  No Change    Warm-up and Cool-down  Performed as group-led instruction    Resistance Training Performed  Yes    VAD Patient?  No    PAD/SET Patient?  No      Pain Assessment   Currently in Pain?  No/denies          Social History   Tobacco Use  Smoking Status Former Smoker  . Packs/day: 3.00  . Years: 27.00  . Pack years: 81.00  . Types: Cigarettes  . Last attempt to quit: 03/15/1991  . Years since quitting: 27.1  Smokeless Tobacco Never Used    Goals Met:  Proper associated with RPD/PD & O2 Sat Independence with exercise equipment Using PLB without cueing & demonstrates good technique Exercise tolerated well No report of cardiac concerns or symptoms Strength training completed today  Goals Unmet:  Not Applicable  Comments: Pt able to follow exercise prescription today without complaint.  Will continue to monitor for progression.    Dr. Emily Filbert is Medical Director for [place] and [facility].

## 2018-04-22 ENCOUNTER — Encounter: Payer: Medicare Other | Admitting: *Deleted

## 2018-04-22 DIAGNOSIS — R0602 Shortness of breath: Secondary | ICD-10-CM | POA: Diagnosis not present

## 2018-04-22 NOTE — Progress Notes (Signed)
Daily Session Note  Patient Details  Name: Susan Callahan MRN: [id number] Date of Birth: February 13, 1951 Referring Provider:     Pulmonary Rehab from 04/08/2018 in [facility] Cardiac and Pulmonary Rehab  Referring Provider  Jamesetta So MD      Encounter Date: 04/22/2018  Check In: Session Check In - 04/22/18 1017      Check-In   Supervising physician immediately available to respond to emergencies  [facility] immediately available ER MD    Physician(s)  Dr. Quentin Cornwall and Dr. Corky Downs    Staff Present  Joellyn Rued, BS, Dennis Bast, BS, ACSM CEP, Exercise Physiologist;Amanda Oletta Darter, [place], ACSM CEP, Exercise Physiologist    Medication changes reported      No    Fall or balance concerns reported     No    Tobacco Cessation  No Change    Warm-up and Cool-down  Performed as group-led instruction    Resistance Training Performed  Yes    VAD Patient?  No    PAD/SET Patient?  No      Pain Assessment   Currently in Pain?  No/denies    Multiple Pain Sites  No          Social History   Tobacco Use  Smoking Status Former Smoker  . Packs/day: 3.00  . Years: 27.00  . Pack years: 81.00  . Types: Cigarettes  . Last attempt to quit: 03/15/1991  . Years since quitting: 27.1  Smokeless Tobacco Never Used    Goals Met:  Proper associated with RPD/PD & O2 Sat Independence with exercise equipment Exercise tolerated well No report of cardiac concerns or symptoms Strength training completed today  Goals Unmet:  Not Applicable  Comments: Pt able to follow exercise prescription today without complaint.  Will continue to monitor for progression.    Dr. Emily Filbert is Medical Director for [place] and [facility].

## 2018-04-24 ENCOUNTER — Ambulatory Visit
Admission: EM | Admit: 2018-04-24 | Discharge: 2018-04-24 | Disposition: A | Discharge status: Home / Self Care | Payer: Medicare Other | Attending: Family Medicine | Admitting: Family Medicine

## 2018-04-24 ENCOUNTER — Encounter: Payer: Self-pay | Admitting: Emergency Medicine

## 2018-04-24 ENCOUNTER — Other Ambulatory Visit: Payer: Self-pay

## 2018-04-24 DIAGNOSIS — H6121 Impacted cerumen, right ear: Secondary | ICD-10-CM

## 2018-04-24 DIAGNOSIS — H9201 Otalgia, right ear: Secondary | ICD-10-CM

## 2018-04-24 DIAGNOSIS — R0602 Shortness of breath: Secondary | ICD-10-CM

## 2018-04-24 HISTORY — DX: Migraine, unspecified, not intractable, without status migrainosus: G43.909

## 2018-04-24 HISTORY — DX: Essential (primary) hypertension: I10

## 2018-04-24 HISTORY — DX: Postherpetic geniculate ganglionitis: B02.21

## 2018-04-24 HISTORY — DX: Type 2 diabetes mellitus without complications: E11.9

## 2018-04-24 MED ORDER — NEOMYCIN-POLYMYXIN-HC 3.5-10000-1 OT SUSP: 4.0000 [drp] | Freq: Three times a day (TID) | OTIC | 0 refills | Status: AC

## 2018-04-24 NOTE — Progress Notes (Signed)
Daily Session Note  Patient Details  Name: Susan Callahan MRN: [id number] Date of Birth: September 09, 1951 Referring Provider:     Pulmonary Rehab from 04/08/2018 in [facility] Cardiac and Pulmonary Rehab  Referring Provider  Jamesetta So MD      Encounter Date: 04/24/2018  Check In: Session Check In - 04/24/18 1140      Check-In   Supervising physician immediately available to respond to emergencies  [facility] immediately available ER MD    Location  [facility]-Cardiac & Pulmonary Rehab    Staff Present  Carson Myrtle, BS, RRT, Respiratory Therapist;Mary Kellie Shropshire, RN, BSN, [place], MA, RCEP, CCRP, Exercise Physiologist;Amanda Oletta Darter, BA, ACSM CEP, Exercise Physiologist    Medication changes reported      No    Fall or balance concerns reported     No    Warm-up and Cool-down  Performed as group-led instruction    Resistance Training Performed  Yes    VAD Patient?  No    PAD/SET Patient?  No      Pain Assessment   Currently in Pain?  No/denies    Multiple Pain Sites  No          Social History   Tobacco Use  Smoking Status Former Smoker  . Packs/day: 3.00  . Years: 27.00  . Pack years: 81.00  . Types: Cigarettes  . Last attempt to quit: 03/15/1991  . Years since quitting: 27.1  Smokeless Tobacco Never Used    Goals Met:  Proper associated with RPD/PD & O2 Sat Independence with exercise equipment Exercise tolerated well Strength training completed today  Goals Unmet:  Not Applicable  Comments: Reviewed home exercise with pt today.  Pt plans to walk and consider [place] for exercise.  Reviewed THR, pulse, RPE, sign and symptoms, NTG use, and when to call 911 or MD.  Also discussed weather considerations and indoor options.  Pt voiced understanding.    Dr. Emily Filbert is Medical Director for [place] and [facility].

## 2018-04-24 NOTE — Discharge Instructions (Addendum)
Use  medication as prescribed.  Continue to monitor closely.  Please have close follow-up with your ENT, follow-up in 2 days.  Follow up with your primary care physician this week as needed.  Prompt reevaluation for any increased dizziness, hearing loss, ear pain, headache, worsening complaints.

## 2018-04-24 NOTE — ED Triage Notes (Signed)
Patient c/o right ear pain and tinnitus that started 2 days ago. Patient denies fever, denies ear drainage.

## 2018-04-24 NOTE — ED Provider Notes (Signed)
[facility] ____________________________________________  Time seen: Approximately 1:56 PM  I have reviewed the triage vital signs and the nursing notes.   HISTORY  Chief Complaint Otalgia  HPI Susan Callahan is a 67 y.o. female past medical history of Ramsay Hunt syndrome, chronic left tympanic membrane perforation, hypertension, diabetes, chronic left facial weakness presenting for evaluation of right ear pain present for 2 days.  States that the pain is an aching discomfort similar to previous ear infections.  States also in the last 2 days she has had accompanying fluid sensation in her ear and feeling the need to pop her ear, as well as some muffled hearing.  Denies any drainage from ear, trauma or bleeding.  Denies accompanying cough, congestion, sore throat or fevers. No pain radiation.  Patient reports that she has chronic intermittent tinnitus and states this is also continuing without change.  Denies headache, neck pain, any changes in chronic left facial weakness or chronic left hearing deficit.  Patient also reports that she is currently under vestibular physical therapy for chronic disequilibrium and dizziness, denies any changes in her baseline of this.  States only complaint and change today is right ear pain.  No alleviating measures attempted prior to arrival.  Denies aggravating factors.  Reports otherwise feels well. Denies recent sickness. Denies recent antibiotic use.    Konrad Saha, MD: PCP [facility]: ENT   Past Medical History:  Diagnosis Date  . Diabetes mellitus without complication ([place])   . Hypertension   . Migraine   . 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   Past Surgical History:  Procedure Laterality Date  . LUMBAR PERITONEAL SHUNT    . TYMPANOSTOMY TUBE PLACEMENT       No current facility-administered medications for this encounter.   Current Outpatient Medications:  .   acetaZOLAMIDE (DIAMOX) 250 MG tablet, Take by mouth., Disp: , Rfl:  .  albuterol (PROVENTIL HFA;VENTOLIN HFA) 108 (90 Base) MCG/ACT inhaler, Inhale into the lungs., Disp: , Rfl:  .  allopurinol (ZYLOPRIM) 300 MG tablet, Take by mouth., Disp: , Rfl:  .  amitriptyline (ELAVIL) 10 MG tablet, Take by mouth., Disp: , Rfl:  .  aspirin EC 81 MG tablet, Take by mouth., Disp: , Rfl:  .  atorvastatin (LIPITOR) 20 MG tablet, Take by mouth., Disp: , Rfl:  .  b complex vitamins capsule, Take by mouth., Disp: , Rfl:  .  Calcium Carbonate-Vitamin D 600-400 MG-UNIT tablet, Take by mouth., Disp: , Rfl:  .  Coenzyme Q10 100 MG capsule, Take by mouth., Disp: , Rfl:  .  furosemide (LASIX) 40 MG tablet, Take by mouth., Disp: , Rfl:  .  gabapentin (NEURONTIN) 600 MG tablet, Take by mouth., Disp: , Rfl:  .  metFORMIN (GLUCOPHAGE-XR) 500 MG 24 hr tablet, Take by mouth., Disp: , Rfl:  .  Multiple Vitamin (MULTIVITAMIN) capsule, Take by mouth., Disp: , Rfl:  .  naproxen (EC NAPROSYN) 500 MG EC tablet, Take one pill bid for one weeks, then prn inflammation, Disp: , Rfl:  .  Omega-3 Fatty Acids ([employer] FISH OIL) 1200 MG CAPS, Take by mouth., Disp: , Rfl:  .  umeclidinium bromide (INCRUSE ELLIPTA) 62.5 MCG/INH AEPB, Inhale into the lungs., Disp: , Rfl:  .  verapamil (CALAN-SR) 240 MG CR tablet, Take by mouth., Disp: , Rfl:  .  magnesium oxide (MAG-OX) 400 MG tablet, Take by mouth., Disp: , Rfl:  .  neomycin-polymyxin-hydrocortisone (CORTISPORIN) 3.5-10000-1 OTIC suspension, Place 4 drops into the right ear 3 (three) times daily for 7 days., Disp: 10 mL, Rfl: 0 .  spironolactone (ALDACTONE) 100 MG tablet, Take 100 mg by mouth daily., Disp: , Rfl: 2  Allergies Penicillins and Sulfa antibiotics  History reviewed. No pertinent family history.  Social History Social History   Tobacco Use  . Smoking status: Former Smoker    Packs/day: 3.00    Years: 27.00    Pack years: 81.00    Types: Cigarettes    Last attempt to  quit: 03/15/1991    Years since quitting: 27.1  . Smokeless tobacco: Never Used  Substance Use Topics  . Alcohol use: Never    Frequency: Never  . Drug use: Never    Review of Systems Constitutional: No fever Eyes: No visual changes. ENT: No sore throat. AS above.  Cardiovascular: Denies chest pain. Respiratory: Denies shortness of breath. Gastrointestinal: No abdominal pain.  No nausea, no vomiting.  Musculoskeletal: Negative for back pain. Skin: Negative for rash.  ____________________________________________   PHYSICAL EXAM:  VITAL SIGNS: ED Triage Vitals  Enc Vitals Group     BP 04/24/18 1330 106/67     Pulse Rate 04/24/18 1330 68     Resp 04/24/18 1330 18     Temp 04/24/18 1330 98 F (36.7 C)     Temp Source 04/24/18 1330 Oral     SpO2 04/24/18 1330 98 %     Weight 04/24/18 1320 210 lb (95.3 kg)     Height 04/24/18 1320 5\' 5"  (1.651 m)     Head Circumference --      Peak Flow --      Pain Score 04/24/18 1320 6     Pain Loc --      Pain Edu? --      Excl. in [place]? --     Constitutional: Alert and oriented. Well appearing and in no acute distress. ENT      Head: Normocephalic and atraumatic.      Ears: Right: minimal tenderness to auricle movement, total cerumen impaction, post cerumen impaction, canal mildly edematous and irritated with slight excoriation and bleeding, cerumen fully removed, no TM erythema, normal-appearing TM, no perforation noted.                 Left: nontender, normal canal, no erythema, pearly color, TM perforation noted. No mastoid tenderness bilaterally.       Nose: No congestion/rhinnorhea.      Mouth/Throat: Mucous membranes are moist.Oropharynx non-erythematous. Neck: No stridor. Supple without meningismus.  Hematological/Lymphatic/Immunilogical: No cervical lymphadenopathy. Cardiovascular: Normal rate, regular rhythm. Grossly normal heart sounds.  Good peripheral circulation. Respiratory: Normal respiratory effort without tachypnea nor  retractions. Breath sounds are clear and equal bilaterally. No wheezes, rales, rhonchi. Musculoskeletal:  Steady gait in room  Neurologic:  Normal speech and language. Speech is normal. No gait instability. No ataxia noted. No paresthesias. 5/5 strength to bilateral upper and lower extremities. Left facial droop.   Skin:  Skin is warm, dry and intact. No rash noted. Psychiatric: Mood and affect are normal. Speech and behavior are normal. Patient exhibits appropriate insight and judgment   ___________________________________________   LABS (all labs ordered are listed, but only abnormal results are displayed)  Labs Reviewed - No data to display   PROCEDURES Procedures   Right cerumen impaction noted.  Procedure explained and verbal consent obtained.  Cerumen removed with curette and irrigation by RN.  Patient tolerated well.  INITIAL IMPRESSION / ASSESSMENT AND PLAN / ED COURSE  Pertinent labs & imaging results that were available during my care of the patient were reviewed by me and considered in my medical decision making (see chart for details).  Well-appearing patient.  Patient with complaints of right ear pain and decreased hearing.  Cerumen impaction noted to right canal and removed with curette and patient reports immediate improved hearing, cerumen partially still remained and removed with irrigation by RN.  Patient tolerated well and continues to report improved hearing post.  Canal irritation and inflammation noted, however TM intact and no erythema.  Will treat with Cortisporin and supportive care.  Strongly recommend close follow-up and close monitoring of symptoms, follow-up with her ENT in 2 days.  Discussed strict sooner return parameters.  Discussed follow up with Primary care physician this week. Discussed follow up and return parameters including no resolution or any worsening concerns. Patient verbalized understanding and agreed to plan.    ____________________________________________   FINAL CLINICAL IMPRESSION(S) / ED DIAGNOSES  Final diagnoses:  Right ear impacted cerumen  Right ear pain     ED Discharge Orders         Ordered    neomycin-polymyxin-hydrocortisone (CORTISPORIN) 3.5-10000-1 OTIC suspension  3 times daily     04/24/18 1432           Note: This dictation was prepared with Dragon dictation along with smaller phrase technology. Any transcriptional errors that result from this process are unintentional.         Marylene Land, NP 04/24/18 1437

## 2018-04-26 ENCOUNTER — Encounter: Payer: Medicare Other | Admitting: *Deleted

## 2018-04-26 DIAGNOSIS — R0602 Shortness of breath: Secondary | ICD-10-CM

## 2018-04-26 NOTE — Progress Notes (Signed)
Daily Session Note  Patient Details  Name: Susan Callahan MRN: [id number] Date of Birth: [birth year] Referring Provider:     Pulmonary Rehab from 04/08/2018 in [facility] Cardiac and Pulmonary Rehab  Referring Provider  Jamesetta So MD      Encounter Date: 04/26/2018  Check In: Session Check In - 04/26/18 1048      Check-In   Supervising physician immediately available to respond to emergencies  [facility] immediately available ER MD    Physician(s)  Pt reports that she has not been feeling well.     Location  [facility]-Cardiac & Pulmonary Rehab    Staff Present  Renita Papa, RN Vickki Hearing, BA, ACSM CEP, Exercise Physiologist;Laureen Owens [place], [place], RRT, Respiratory Therapist    Medication changes reported      No    Fall or balance concerns reported     No    Warm-up and Cool-down  Performed as group-led instruction    Resistance Training Performed  Yes    VAD Patient?  No    PAD/SET Patient?  No      Pain Assessment   Currently in Pain?  No/denies          Social History   Tobacco Use  Smoking Status Former Smoker  . Packs/day: 3.00  . Years: 27.00  . Pack years: 81.00  . Types: Cigarettes  . Last attempt to quit: 03/15/1991  . Years since quitting: 27.1  Smokeless Tobacco Never Used    Goals Met:  Proper associated with RPD/PD & O2 Sat Independence with exercise equipment Using PLB without cueing & demonstrates good technique Exercise tolerated well No report of cardiac concerns or symptoms Strength training completed today  Goals Unmet:  Not Applicable  Comments: Pt able to follow exercise prescription today without complaint.  Will continue to monitor for progression.    Dr. Emily Filbert is Medical Director for [facility] and [facility].

## 2018-04-29 DIAGNOSIS — R0602 Shortness of breath: Secondary | ICD-10-CM

## 2018-04-29 NOTE — Progress Notes (Signed)
Daily Session Note  Patient Details  Name: Susan Callahan MRN: [id number] Date of Birth: 1951/08/17 Referring Provider:     Pulmonary Rehab from 04/08/2018 in [facility] Pul[facility] Rehab  Referring Provider  Jamesetta So MD      Encounter Date: 04/29/2018  Check In: Session Check In - 04/29/18 1008      Check-In   Supervising physician immediately available to respond to emergencies  [facility] immediately available ER MD    Physician(s)  Dr. Corky Downs and Dr. Kerman Passey    Location  [facility]-Cardiac & Pulmonary Rehab    Staff Present  Earlean Shawl, BS, ACSM CEP, Exercise Physiologist;Joseph [facility], [place], ACSM CEP, Exercise Physiologist    Medication changes reported      No    Fall or balance concerns reported     No    Tobacco Cessation  No Change    Warm-up and Cool-down  Performed as group-led instruction    Resistance Training Performed  Yes    VAD Patient?  No    PAD/SET Patient?  No      Pain Assessment   Currently in Pain?  No/denies    Multiple Pain Sites  No          Social History   Tobacco Use  Smoking Status Former Smoker  . Packs/day: 3.00  . Years: 27.00  . Pack years: 81.00  . Types: Cigarettes  . Last attempt to quit: 03/15/1991  . Years since quitting: 27.1  Smokeless Tobacco Never Used    Goals Met:  Proper associated with RPD/PD & O2 Sat Independence with exercise equipment Exercise tolerated well No report of cardiac concerns or symptoms Strength training completed today  Goals Unmet:  Not Applicable  Comments: Pt able to follow exercise prescription today without complaint.  Will continue to monitor for progression.    Dr. Emily Filbert is Medical Director for [facility] and [facility].

## 2018-05-03 DIAGNOSIS — R0602 Shortness of breath: Secondary | ICD-10-CM

## 2018-05-03 NOTE — Progress Notes (Signed)
Daily Session Note  Patient Details  Name: Susan Callahan MRN: [id number] Date of Birth: [birth year] Referring Provider:     Pulmonary Rehab from 04/08/2018 in [facility] Card[facility] and Pulmonary Rehab  Referring Provider  Jamesetta So MD      Encounter Date: 05/03/2018  Check In:      Social History   Tobacco Use  Smoking Status Former Smoker  . Packs/day: 3.00  . Years: 27.00  . Pack years: 81.00  . Types: Cigarettes  . Last attempt to quit: 03/15/1991  . Years since quitting: 27.1  Smokeless Tobacco Never Used    Goals Met:  Independence with exercise equipment Exercise tolerated well No report of cardiac concerns or symptoms Strength training completed today  Goals Unmet:  Not Applicable  Comments: Pt able to follow exercise prescription today without complaint.  Will continue to monitor for progression.   Dr. Emily Filbert is Medical Director for [facility] and [facility].

## 2018-05-06 ENCOUNTER — Encounter: Payer: Medicare Other | Admitting: *Deleted

## 2018-05-06 DIAGNOSIS — R0602 Shortness of breath: Secondary | ICD-10-CM

## 2018-05-06 NOTE — Progress Notes (Signed)
Daily Session Note  Patient Details  Name: Geneen Dieter MRN: [id number] Date of Birth: 18-Feb-1951 Referring Provider:     Pulmonary Rehab from 04/08/2018 in [facility] Cardiac and Pulmonary Rehab  Referring Provider  Jamesetta So MD      Encounter Date: 05/06/2018  Check In: Session Check In - 05/06/18 1020      Check-In   Supervising physician immediately available to respond to emergencies  [facility] immediately available ER MD    Physician(s)  Dr. Clearnce Hasten and Dr. Archie Balboa    Location  [facility]-Cardiac & Pulmonary Rehab    Staff Present  Earlean Shawl, BS, ACSM CEP, Exercise Physiologist;Amanda Oletta Darter, BA, ACSM CEP, Exercise Physiologist;Joseph Tessie Fass RCP,RRT,BSRT    Medication changes reported      No    Fall or balance concerns reported     No    Tobacco Cessation  No Change    Warm-up and Cool-down  Performed as group-led instruction    Resistance Training Performed  Yes    VAD Patient?  No    PAD/SET Patient?  No      Pain Assessment   Currently in Pain?  No/denies    Multiple Pain Sites  No          Social History   Tobacco Use  Smoking Status Former Smoker  . Packs/day: 3.00  . Years: 27.00  . Pack years: 81.00  . Types: Cigarettes  . Last attempt to quit: 03/15/1991  . Years since quitting: 27.1  Smokeless Tobacco Never Used    Goals Met:  Proper associated with RPD/PD & O2 Sat Independence with exercise equipment Exercise tolerated well No report of cardiac concerns or symptoms Strength training completed today  Goals Unmet:  Not Applicable  Comments: Pt able to follow exercise prescription today without complaint.  Will continue to monitor for progression.    Dr. Emily Filbert is Medical Director for [facility] and [facility].

## 2018-05-06 NOTE — Progress Notes (Signed)
Pulmonary Individual Treatment Plan  Patient Details  Name: Susan Callahan MRN: [id number] Date of Birth: [birth year] Referring Provider:     Pulmonary Rehab from 04/08/2018 in [facility]  Referring Provider  Jamesetta So MD      Initial Encounter Date:    Pulmonary Rehab from 04/08/2018 in [facility]  Date  04/08/18      Visit Diagnosis: Shortness of breath  Patient's Home Medications on Admission:  Current Outpatient Medications:  .  acetaZOLAMIDE (DIAMOX) 250 MG tablet, Take by mouth., Disp: , Rfl:  .  albuterol (PROVENTIL HFA;VENTOLIN HFA) 108 (90 Base) MCG/ACT inhaler, Inhale into the lungs., Disp: , Rfl:  .  allopurinol (ZYLOPRIM) 300 MG tablet, Take by mouth., Disp: , Rfl:  .  amitriptyline (ELAVIL) 10 MG tablet, Take by mouth., Disp: , Rfl:  .  aspirin EC 81 MG tablet, Take by mouth., Disp: , Rfl:  .  atorvastatin (LIPITOR) 20 MG tablet, Take by mouth., Disp: , Rfl:  .  b complex vitamins capsule, Take by mouth., Disp: , Rfl:  .  Calcium Carbonate-Vitamin D 600-400 MG-UNIT tablet, Take by mouth., Disp: , Rfl:  .  Coenzyme Q10 100 MG capsule, Take by mouth., Disp: , Rfl:  .  furosemide (LASIX) 40 MG tablet, Take by mouth., Disp: , Rfl:  .  gabapentin (NEURONTIN) 600 MG tablet, Take by mouth., Disp: , Rfl:  .  magnesium oxide (MAG-OX) 400 MG tablet, Take by mouth., Disp: , Rfl:  .  metFORMIN (GLUCOPHAGE-XR) 500 MG 24 hr tablet, Take by mouth., Disp: , Rfl:  .  Multiple Vitamin (MULTIVITAMIN) capsule, Take by mouth., Disp: , Rfl:  .  naproxen (EC NAPROSYN) 500 MG EC tablet, Take one pill bid for one weeks, then prn inflammation, Disp: , Rfl:  .  Omega-3 Fatty Acids ([employer] FISH OIL) 1200 MG CAPS, Take by mouth., Disp: , Rfl:  .  spironolactone (ALDACTONE) 100 MG tablet, Take 100 mg by mouth daily., Disp: , Rfl: 2 .  umeclidinium bromide (INCRUSE ELLIPTA) 62.5 MCG/INH AEPB, Inhale into the lungs., Disp: , Rfl:  .  verapamil  (CALAN-SR) 240 MG CR tablet, Take by mouth., Disp: , Rfl:   Past Medical History: Past Medical History:  Diagnosis Date  . Diabetes mellitus without complication ([place])   . Hypertension   . Migraine   . 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 Packs/day: 3.00  . Years: 27.00  . Pack years: 81.00  . Types: Cigarettes  . Last attempt to quit: 03/15/1991  . Years since quitting: 27.1  Smokeless Tobacco Never Used    Labs: Recent Review Flowsheet Data    There is no flowsheet data to display.       Pulmonary Assessment Scores: Pulmonary Assessment Scores    Row Name 04/08/18 1359         ADL UCSD   ADL Phase  Entry     SOB Score total  37     Rest  1     Walk  1     Stairs  4     Bath  0     Dress  0     Shop  2       CAT Score   CAT Score  19       mMRC Score   mMRC Score  1  Pulmonary Function Assessment: Pulmonary Function Assessment - 04/08/18 1402      Initial Spirometry Results   FVC%  99.5 %    FEV1%  80.5 %    FEV1/FVC Ratio  68    Comments  Test results from [facility] 04/03/18      Post Bronchodilator Spirometry Results   FVC%  99 %    FEV1%  85.5 %    FEV1/FVC Ratio  70    Comments  Test results from [facility] 04/03/18      Breath   Bilateral Breath Sounds  Clear    Shortness of Breath  Yes;Limiting activity       Exercise Target Goals: Exercise Program Goal: Individual exercise prescription set using results from initial 6 min walk test and THRR while considering  patient's activity barriers and safety.   Exercise Prescription Goal: Initial exercise prescription builds to 30-45 minutes a day of aerobic activity, 2-3 days per week.  Home exercise guidelines will be given to patient during program as part of exercise prescription that the participant will acknowledge.  Activity Barriers & Risk Stratification: Activity Barriers & Cardiac Risk  Stratification - 04/08/18 1432      Activity Barriers & Cardiac Risk Stratification   Activity Barriers  Balance Concerns;Muscular Weakness;Deconditioning;Shortness of Breath;Back Problems       6 Minute Walk: 6 Minute Walk    Row Name 04/08/18 1429         6 Minute Walk   Phase  Initial     Distance  1475 feet     Walk Time  6 minutes     # of Rest Breaks  0     MPH  2.79     METS  3.25     RPE  13     Perceived Dyspnea   3     VO2 Peak  11.39     Symptoms  Yes (comment)     Comments  SOB     Resting HR  68 bpm     Resting BP  144/72     Resting Oxygen Saturation   95 %     Exercise Oxygen Saturation  during 6 min walk  87 %     Max Ex. HR  112 bpm     Max Ex. BP  154/86     2 Minute Post BP  146/80       Interval HR   1 Minute HR  83     2 Minute HR  108     3 Minute HR  106     4 Minute HR  111     5 Minute HR  114     6 Minute HR  112     2 Minute Post HR  81     Interval Heart Rate?  Yes       Interval Oxygen   Interval Oxygen?  Yes     Baseline Oxygen Saturation %  95 %     1 Minute Oxygen Saturation %  91 %     1 Minute Liters of Oxygen  0 L Room Air     2 Minute Oxygen Saturation %  91 %     2 Minute Liters of Oxygen  0 L     3 Minute Oxygen Saturation %  87 %     3 Minute Liters of Oxygen  0 L     4 Minute Oxygen Saturation %  93 %     4 Minute Liters of Oxygen  0 L     5 Minute Oxygen Saturation %  93 %     5 Minute Liters of Oxygen  0 L     6 Minute Oxygen Saturation %  94 %     6 Minute Liters of Oxygen  0 L     2 Minute Post Oxygen Saturation %  96 %     2 Minute Post Liters of Oxygen  0 L       Oxygen Initial Assessment: Oxygen Initial Assessment - 04/08/18 1430      Home Oxygen   Home Oxygen Device  None    Sleep Oxygen Prescription  None    Home Exercise Oxygen Prescription  None    Home at Rest Exercise Oxygen Prescription  None      Initial 6 min Walk   Oxygen Used  None      Program Oxygen Prescription   Program Oxygen  Prescription  None      Intervention   Short Term Goals  To learn and understand importance of maintaining oxygen saturations>88%;To learn and demonstrate proper pursed lip breathing techniques or other breathing techniques.;To learn and demonstrate proper use of respiratory medications;To learn and understand importance of monitoring SPO2 with pulse oximeter and demonstrate accurate use of the pulse oximeter.   Her inhaler was 300 dollars and cannot afford her inhaler.   Long  Term Goals  Verbalizes importance of monitoring SPO2 with pulse oximeter and return demonstration;Maintenance of O2 saturations>88%;Exhibits proper breathing techniques, such as pursed lip breathing or other method taught during program session;Demonstrates proper use of MDI's;Compliance with respiratory medication       Oxygen Re-Evaluation: Oxygen Re-Evaluation    Row Name 04/15/18 1039             Program Oxygen Prescription   Program Oxygen Prescription  None         Home Oxygen   Home Oxygen Device  None       Sleep Oxygen Prescription  None       Home Exercise Oxygen Prescription  None       Home at Rest Exercise Oxygen Prescription  None         Goals/Expected Outcomes   Short Term Goals  To learn and understand importance of maintaining oxygen saturations>88%;To learn and demonstrate proper pursed lip breathing techniques or other breathing techniques.;To learn and demonstrate proper use of respiratory medications;To learn and understand importance of monitoring SPO2 with pulse oximeter and demonstrate accurate use of the pulse oximeter.       Long  Term Goals  Verbalizes importance of monitoring SPO2 with pulse oximeter and return demonstration;Maintenance of O2 saturations>88%;Exhibits proper breathing techniques, such as pursed lip breathing or other method taught during program session;Demonstrates proper use of MDI's;Compliance with respiratory medication       Comments  Reviewed PLB technique with  pt.  Talked about how it work and it's important to maintaining his exercise saturations.         Goals/Expected Outcomes  Short: Become more profiecient at using PLB.   Long: Become independent at using PLB.          Oxygen Discharge (Final Oxygen Re-Evaluation): Oxygen Re-Evaluation - 04/15/18 1039      Program Oxygen Prescription   Program Oxygen Prescription  None      Home Oxygen   Home Oxygen Device  None    Sleep Oxygen  Prescription  None    Home Exercise Oxygen Prescription  None    Home at Rest Exercise Oxygen Prescription  None      Goals/Expected Outcomes   Short Term Goals  To learn and understand importance of maintaining oxygen saturations>88%;To learn and demonstrate proper pursed lip breathing techniques or other breathing techniques.;To learn and demonstrate proper use of respiratory medications;To learn and understand importance of monitoring SPO2 with pulse oximeter and demonstrate accurate use of the pulse oximeter.    Long  Term Goals  Verbalizes importance of monitoring SPO2 with pulse oximeter and return demonstration;Maintenance of O2 saturations>88%;Exhibits proper breathing techniques, such as pursed lip breathing or other method taught during program session;Demonstrates proper use of MDI's;Compliance with respiratory medication    Comments  Reviewed PLB technique with pt.  Talked about how it work and it's important to maintaining his exercise saturations.      Goals/Expected Outcomes  Short: Become more profiecient at using PLB.   Long: Become independent at using PLB.       Initial Exercise Prescription: Initial Exercise Prescription - 04/08/18 1400      Date of Initial Exercise RX and Referring Provider   Date  04/08/18    Referring Provider  Jamesetta So MD      Treadmill   MPH  2.5    Grade  1    Minutes  15    METs  3.26      Recumbant Bike   Level  2    RPM  50    Watts  38    Minutes  15    METs  3.2      NuStep   Level  3    SPM   80    Minutes  15    METs  3      Prescription Details   Frequency (times per week)  3    Duration  Progress to 45 minutes of aerobic exercise without signs/symptoms of physical distress      Intensity   THRR 40-80% of Max Heartrate  102-136    Ratings of Perceived Exertion  11-13    Perceived Dyspnea  0-4      Progression   Progression  Continue to progress workloads to maintain intensity without signs/symptoms of physical distress.      Resistance Training   Training Prescription  Yes    Weight  3 lbs    Reps  10-15       Perform Capillary Blood Glucose checks as needed.  Exercise Prescription Changes: Exercise Prescription Changes    Row Name 04/08/18 1400 04/24/18 1200           Response to Exercise   Blood Pressure (Admit)  144/72  114/68      Blood Pressure (Exercise)  154/86  -      Blood Pressure (Exit)  146/80  120/72      Heart Rate (Admit)  68 bpm  108 bpm      Heart Rate (Exercise)  114 bpm  76 bpm      Heart Rate (Exit)  81 bpm  69 bpm      Oxygen Saturation (Admit)  95 %  92 %      Oxygen Saturation (Exercise)  87 %  95 %      Oxygen Saturation (Exit)  96 %  92 %      Rating of Perceived Exertion (Exercise)  13  13  Perceived Dyspnea (Exercise)  3  2      Symptoms  SOB  -      Comments  walk test results  -      Duration  -  Continue with 45 min of aerobic exercise without signs/symptoms of physical distress.      Intensity  -  THRR unchanged        Progression   Progression  -  Continue to progress workloads to maintain intensity without signs/symptoms of physical distress.      Average METs  -  2.63        Resistance Training   Training Prescription  -  Yes      Weight  -  3 lb      Reps  -  10-15        Interval Training   Interval Training  -  No        Treadmill   MPH  -  2.5      Grade  -  1      Minutes  -  15      METs  -  3.26        NuStep   Level  -  3      SPM  -  80      Minutes  -  15      METs  -  2        Home  Exercise Plan   Plans to continue exercise at  -  [employer]      Frequency  -  Add 1 additional day to program exercise sessions.      Initial Home Exercises Provided  -  04/24/18         Exercise Comments: Exercise Comments    Row Name 04/15/18 1023 04/24/18 1143         Exercise Comments   First full day of exercise!  Patient was oriented to gym and equipment including functions, settings, policies, and procedures.  Patient's individual exercise prescription and treatment plan were reviewed.  All starting workloads were established based on the results of the 6 minute walk test done at initial orientation visit.  The plan for exercise progression was also introduced and progression will be customized based on patient's performance and goals.  Reviewed home exercise with pt today.  Pt plans to walk and consider [place] for exercise.  Reviewed THR, pulse, RPE, sign and symptoms, NTG use, and when to call 911 or MD.  Also discussed weather considerations and indoor options.  Pt voiced understanding.         Exercise Goals and Review: Exercise Goals    Row Name 04/08/18 1435             Exercise Goals   Increase Physical Activity  Yes       Intervention  Provide advice, education, support and counseling about physical activity/exercise needs.;Develop an individualized exercise prescription for aerobic and resistive training based on initial evaluation findings, risk stratification, comorbidities and participant's personal goals.       Expected Outcomes  Short Term: Attend rehab on a regular basis to increase amount of physical activity.;Long Term: Add in home exercise to make exercise part of routine and to increase amount of physical activity.;Long Term: Exercising regularly at least 3-5 days a week.       Increase Strength and Stamina  Yes       Intervention  Provide advice, education,  support and counseling about physical activity/exercise needs.;Develop an individualized  exercise prescription for aerobic and resistive training based on initial evaluation findings, risk stratification, comorbidities and participant's personal goals.       Expected Outcomes  Short Term: Increase workloads from initial exercise prescription for resistance, speed, and METs.;Short Term: Perform resistance training exercises routinely during rehab and add in resistance training at home;Long Term: Improve cardiorespiratory fitness, muscular endurance and strength as measured by increased METs and functional capacity (6MWT)       Able to understand and use rate of perceived exertion (RPE) scale  Yes       Intervention  Provide education and explanation on how to use RPE scale       Expected Outcomes  Short Term: Able to use RPE daily in rehab to express subjective intensity level;Long Term:  Able to use RPE to guide intensity level when exercising independently       Able to understand and use Dyspnea scale  Yes       Intervention  Provide education and explanation on how to use Dyspnea scale       Expected Outcomes  Short Term: Able to use Dyspnea scale daily in rehab to express subjective sense of shortness of breath during exertion;Long Term: Able to use Dyspnea scale to guide intensity level when exercising independently       Knowledge and understanding of Target Heart Rate Range (THRR)  Yes       Intervention  Provide education and explanation of THRR including how the numbers were predicted and where they are located for reference       Expected Outcomes  Short Term: Able to state/look up THRR;Short Term: Able to use daily as guideline for intensity in rehab;Long Term: Able to use THRR to govern intensity when exercising independently       Able to check pulse independently  Yes       Intervention  Provide education and demonstration on how to check pulse in carotid and radial arteries.;Review the importance of being able to check your own pulse for safety during independent exercise        Expected Outcomes  Short Term: Able to explain why pulse checking is important during independent exercise;Long Term: Able to check pulse independently and accurately       Understanding of Exercise Prescription  Yes       Intervention  Provide education, explanation, and written materials on patient's individual exercise prescription       Expected Outcomes  Short Term: Able to explain program exercise prescription;Long Term: Able to explain home exercise prescription to exercise independently          Exercise Goals Re-Evaluation : Exercise Goals Re-Evaluation    Row Name 04/15/18 1023 04/24/18 1143 04/24/18 1248         Exercise Goal Re-Evaluation   Exercise Goals Review  Increase Physical Activity;Increase Strength and Stamina;Able to understand and use rate of perceived exertion (RPE) scale;Knowledge and understanding of Target Heart Rate Range (THRR);Able to understand and use Dyspnea scale;Understanding of Exercise Prescription  Increase Physical Activity;Increase Strength and Stamina;Able to understand and use rate of perceived exertion (RPE) scale;Able to understand and use Dyspnea scale;Knowledge and understanding of Target Heart Rate Range (THRR);Able to check pulse independently  Increase Physical Activity;Increase Strength and Stamina;Able to understand and use rate of perceived exertion (RPE) scale;Able to understand and use Dyspnea scale;Knowledge and understanding of Target Heart Rate Range (THRR)     Comments  Reviewed RPE scale, THR and program prescription with pt today.  Pt voiced understanding and was given a copy of goals to take home.   Reviewed home exercise with pt today.  Pt plans to walk and consider [place] for exercise.  Reviewed THR, pulse, RPE, sign and symptoms, NTG use, and when to call 911 or MD.  Also discussed weather considerations and indoor options.  Pt voiced understanding.  -     Expected Outcomes  Short: Use RPE daily to regulate intensity. Long:  Follow program prescription in THR.  Short - add one day of walking in addition to program sessions and research options like FF Long - maintain exercise on her own  -        Discharge Exercise Prescription (Final Exercise Prescription Changes): Exercise Prescription Changes - 04/24/18 1200      Response to Exercise   Blood Pressure (Admit)  114/68    Blood Pressure (Exit)  120/72    Heart Rate (Admit)  108 bpm    Heart Rate (Exercise)  76 bpm    Heart Rate (Exit)  69 bpm    Oxygen Saturation (Admit)  92 %    Oxygen Saturation (Exercise)  95 %    Oxygen Saturation (Exit)  92 %    Rating of Perceived Exertion (Exercise)  13    Perceived Dyspnea (Exercise)  2    Duration  Continue with 45 min of aerobic exercise without signs/symptoms of physical distress.    Intensity  THRR unchanged      Progression   Progression  Continue to progress workloads to maintain intensity without signs/symptoms of physical distress.    Average METs  2.63      Resistance Training   Training Prescription  Yes    Weight  3 lb    Reps  10-15      Interval Training   Interval Training  No      Treadmill   MPH  2.5    Grade  1    Minutes  15    METs  3.26      NuStep   Level  3    SPM  80    Minutes  15    METs  2      Home Exercise Plan   Plans to continue exercise at  [employer]    Frequency  Add 1 additional day to program exercise sessions.    Initial Home Exercises Provided  04/24/18       Nutrition:  Target Goals: Understanding of nutrition guidelines, daily intake of sodium <1590m, cholesterol <2070m calories 30% from fat and 7% or less from saturated fats, daily to have 5 or more servings of fruits and vegetables.  Biometrics: Pre Biometrics - 04/08/18 1435      Pre Biometrics   Height  5' 5.1" (1.654 m)    Weight  210 lb 12.8 oz (95.6 kg)    Waist Circumference  43 inches    Hip Circumference  44 inches    Waist to Hip Ratio  0.98 %    BMI (Calculated)  34.95    Single  Leg Stand  0.72 seconds        Nutrition Therapy Plan and Nutrition Goals: Nutrition Therapy & Goals - 04/17/18 1414      Nutrition Therapy   Diet  TLC    Drug/Food Interactions  Statins/Certain Fruits   reports to consume grapefruit with cottage cheese occasionally   Protein (specify  units)  10oz    Fiber  25 grams    Whole Grain Foods  2 servings   3 ideal, not regularly consuming whole grain options   Saturated Fats  12 max. grams    Fruits and Vegetables  5 servings/day   8 ideal. eats 2 meals/day   Sodium  2000 grams      Personal Nutrition Goals   Nutrition Goal  When choosing frozen meals, choose options with no more than 642m of sodium per meal    Personal Goal #2  Opt for frozen over canned vegetables when possible    Personal Goal #3  Eat breakfast on a more consistent schedule, particularly on the days when you are planning to exercise    Comments  She is on a tight budget but is seeking to eat healthier. Does not follow a diabetic diet and consumes a diet high in frozen / pre-prepared foods       Intervention Plan   Intervention  Prescribe, educate and counsel regarding individualized specific dietary modifications aiming towards targeted core components such as weight, hypertension, lipid management, diabetes, heart failure and other comorbidities.;Nutrition handout(s) given to patient.   Spice it up salt-free seasoning recipe sheet, Low sodium nutrition therapy   Expected Outcomes  Short Term Goal: Understand basic principles of dietary content, such as calories, fat, sodium, cholesterol and nutrients.;Short Term Goal: A plan has been developed with personal nutrition goals set during dietitian appointment.       Nutrition Assessments: Nutrition Assessments - 04/08/18 1359      MEDFICTS Scores   Pre Score  28       Nutrition Goals Re-Evaluation: Nutrition Goals Re-Evaluation    Row Name 04/17/18 1437             Goals   Nutrition Goal  When choosing  frozen meals, choose options with no more than 6079mof sodium per meal       Comment  She consumes frozen meals regularly and does not always pay attention to sodium content       Expected Outcome  She will choose frozen meals with less than 60027mf sodium, ideally with at least 15g protein and greater than 4g fiber          Personal Goal #2 Re-Evaluation   Personal Goal #2  Opt for frozen over canned vegetables when possible         Personal Goal #3 Re-Evaluation   Personal Goal #3  Eat breakfast on a more consistent schedule, particularly on the days when you are planning to exercise          Nutrition Goals Discharge (Final Nutrition Goals Re-Evaluation): Nutrition Goals Re-Evaluation - 04/17/18 1437      Goals   Nutrition Goal  When choosing frozen meals, choose options with no more than 600m84m sodium per meal    Comment  She consumes frozen meals regularly and does not always pay attention to sodium content    Expected Outcome  She will choose frozen meals with less than 600mg51msodium, ideally with at least 15g protein and greater than 4g fiber       Personal Goal #2 Re-Evaluation   Personal Goal #2  Opt for frozen over canned vegetables when possible      Personal Goal #3 Re-Evaluation   Personal Goal #3  Eat breakfast on a more consistent schedule, particularly on the days when you are planning to exercise  Psychosocial: Target Goals: Acknowledge presence or absence of significant depression and/or stress, maximize coping skills, provide positive support system. Participant is able to verbalize types and ability to use techniques and skills needed for reducing stress and depression.   Initial Review & Psychosocial Screening: Initial Psych Review & Screening - 04/08/18 1422      Initial Review   Current issues with  Current Depression;History of Depression;Current Stress Concerns    Source of Stress Concerns  Financial;Retirement/disability;Unable to perform  yard/household activities    Comments  She has not planed to be this disabled when she is retired. She is unable to do yardwork and do the things she used to do.      Family Dynamics   Good Support System?  No    Concerns  No support system    Comments  She states her sister is sort of there but has health issues and things she is dealing with.      Barriers   Psychosocial barriers to participate in program  The patient should benefit from training in stress management and relaxation.      Screening Interventions   Interventions  Encouraged to exercise;Program counselor consult;To provide support and resources with identified psychosocial needs;Provide feedback about the scores to participant    Expected Outcomes  Short Term goal: Utilizing psychosocial counselor, staff and physician to assist with identification of specific Stressors or current issues interfering with healing process. Setting desired goal for each stressor or current issue identified.;Long Term Goal: Stressors or current issues are controlled or eliminated.;Short Term goal: Identification and review with participant of any Quality of Life or Depression concerns found by scoring the questionnaire.;Long Term goal: The participant improves quality of Life and PHQ9 Scores as seen by post scores and/or verbalization of changes       Quality of Life Scores:  Scores of 19 and below usually indicate a poorer quality of life in these areas.  A difference of  2-3 points is a clinically meaningful difference.  A difference of 2-3 points in the total score of the Quality of Life Index has been associated with significant improvement in overall quality of life, self-image, physical symptoms, and general health in studies assessing change in quality of life.  PHQ-9: Recent Review Flowsheet Data    Depression screen [facility] 2/9 04/08/2018   Decreased Interest 1   Down, Depressed, Hopeless 1   PHQ - 2 Score 2   Altered sleeping 0   Tired,  decreased energy 2   Change in appetite 1   Feeling bad or failure about yourself  1   Trouble concentrating 0   Moving slowly or fidgety/restless 0   Suicidal thoughts 0   PHQ-9 Score 6   Difficult doing work/chores Somewhat difficult     Interpretation of Total Score  Total Score Depression Severity:  1-4 = Minimal depression, 5-9 = Mild depression, 10-14 = Moderate depression, 15-19 = Moderately severe depression, 20-27 = Severe depression   Psychosocial Evaluation and Intervention: Psychosocial Evaluation - 04/15/18 1042      Psychosocial Evaluation & Interventions   Interventions  Encouraged to exercise with the program and follow exercise prescription;Stress management education    Comments  Counselor met with Ms. Hendricks Milo) today for initial psychosocial evaluation.  She is a 67 year old who has COPD.  Nyrah has a limited support system as she lives alone and has a sister in [place].  She reports sleeping well and having an "okay" appetite -  wherein she has begun losing weight recently - which she states is a "good thing."  Etna has a long history of depression and is on medications that help with this.  She reports her mood is stable and she has multiple stressors with her health and finances and a limited support system.  Her goals for this program are to be able to return to more normal activities without SOB; to walk more easily; to increase her energy and stamina and to lose more weight.  Staff will follow with Vaughan Basta.    Expected Outcomes  Short:  Lovelee will meet with the dietician to address her weight loss goals.  She will also exercise more consistently to improve her energy; stamina and mood.   Long:  Melena will develop a routine of positive self-care for her health and mental health.    Continue Psychosocial Services   Follow up required by staff       Psychosocial Re-Evaluation: Psychosocial Re-Evaluation    Isabella Name 04/22/18 1123             Psychosocial  Re-Evaluation   Current issues with  Current Stress Concerns       Comments  Counselor followed up with Vaughan Basta today reporting she is frustrated with the staff in that she cannot remember how to work the machines (with her cognitive issues) and she hates to keep asking them to help.  Kaydon states she is beginning to take notes on how to use the machines in this class for her benefit in the future.  Counselor commended her positive self-care and being proactive re: her needs.         Expected Outcomes  Short:  Shikha will develop independent skills in her self-care by making notes on the machines she uses and how to operate.   Long:  Elveria will continue to exercise consistently to achieve her stated goals of weight loss and increased energy/stamina and improved mood.        Continue Psychosocial Services   Follow up required by staff          Psychosocial Discharge (Final Psychosocial Re-Evaluation): Psychosocial Re-Evaluation - 04/22/18 1123      Psychosocial Re-Evaluation   Current issues with  Current Stress Concerns    Comments  Counselor followed up with Vaughan Basta today reporting she is frustrated with the staff in that she cannot remember how to work the machines (with her cognitive issues) and she hates to keep asking them to help.  Bertie states she is beginning to take notes on how to use the machines in this class for her benefit in the future.  Counselor commended her positive self-care and being proactive re: her needs.      Expected Outcomes  Short:  Raylinn will develop independent skills in her self-care by making notes on the machines she uses and how to operate.   Long:  Leyna will continue to exercise consistently to achieve her stated goals of weight loss and increased energy/stamina and improved mood.     Continue Psychosocial Services   Follow up required by staff       Education: Education Goals: Education classes will be provided on a weekly basis, covering required topics.  Participant will state understanding/return demonstration of topics presented.  Learning Barriers/Preferences: Learning Barriers/Preferences - 04/08/18 1428      Learning Barriers/Preferences   Learning Barriers  None    Learning Preferences  None       Education Topics:  Initial  Evaluation Education: - Verbal, written and demonstration of respiratory meds, oximetry and breathing techniques. Instruction on use of nebulizers and MDIs and importance of monitoring MDI activations.   Pulmonary Rehab from 04/26/2018 in [facility] Cardiac and Pulmonary Rehab  Date  04/08/18  Educator  [facility]  Instruction Review Code  1- Verbalizes Understanding      General Nutrition Guidelines/Fats and Fiber: -Group instruction provided by verbal, written material, models and posters to present the general guidelines for heart healthy nutrition. Gives an explanation and review of dietary fats and fiber.   Controlling Sodium/Reading Food Labels: -Group verbal and written material supporting the discussion of sodium use in heart healthy nutrition. Review and explanation with models, verbal and written materials for utilization of the food label.   Exercise Physiology & General Exercise Guidelines: - Group verbal and written instruction with models to review the exercise physiology of the cardiovascular system and associated critical values. Provides general exercise guidelines with specific guidelines to those with heart or lung disease.    Aerobic Exercise & Resistance Training: - Gives group verbal and written instruction on the various components of exercise. Focuses on aerobic and resistive training programs and the benefits of this training and how to safely progress through these programs.   Flexibility, Balance, Mind/Body Relaxation: Provides group verbal/written instruction on the benefits of flexibility and balance training, including mind/body exercise modes such as yoga, pilates and tai chi.   Demonstration and skill practice provided.   Stress and Anxiety: - Provides group verbal and written instruction about the health risks of elevated stress and causes of high stress.  Discuss the correlation between heart/lung disease and anxiety and treatment options. Review healthy ways to manage with stress and anxiety.   Pulmonary Rehab from 04/17/2018 in [facility] Cardiac and Pulmonary Rehab  Date  04/17/18  Educator  [facility]  Instruction Review Code  1- Verbalizes Understanding      Depression: - Provides group verbal and written instruction on the correlation between heart/lung disease and depressed mood, treatment options, and the stigmas associated with seeking treatment.   Exercise & Equipment Safety: - Individual verbal instruction and demonstration of equipment use and safety with use of the equipment.   Pulmonary Rehab from 04/26/2018 in [facility] Cardiac and Pulmonary Rehab  Date  04/08/18  Educator  [facility]  Instruction Review Code  1- Verbalizes Understanding      Infection Prevention: - Provides verbal and written material to individual with discussion of infection control including proper hand washing and proper equipment cleaning during exercise session.   Pulmonary Rehab from 04/26/2018 in [facility] Cardiac and Pulmonary Rehab  Date  04/08/18  Educator  [facility]  Instruction Review Code  1- Verbalizes Understanding      Falls Prevention: - Provides verbal and written material to individual with discussion of falls prevention and safety.   Pulmonary Rehab from 04/26/2018 in [facility] Cardiac and Pulmonary Rehab  Date  04/08/18  Educator  [facility]  Instruction Review Code  1- Verbalizes Understanding      Diabetes: - Individual verbal and written instruction to review signs/symptoms of diabetes, desired ranges of glucose level fasting, after meals and with exercise. Advice that pre and post exercise glucose checks will be done for 3 sessions at entry of program.   Chronic Lung Diseases: - Group  verbal and written instruction to review updates, respiratory medications, advancements in procedures and treatments. Discuss use of supplemental oxygen including available portable oxygen systems, continuous and intermittent flow rates, concentrators, personal use  and safety guidelines. Review proper use of inhaler and spacers. Provide informative websites for self-education.    Energy Conservation: - Provide group verbal and written instruction for methods to conserve energy, plan and organize activities. Instruct on pacing techniques, use of adaptive equipment and posture/positioning to relieve shortness of breath.   Triggers and Exacerbations: - Group verbal and written instruction to review types of environmental triggers and ways to prevent exacerbations. Discuss weather changes, air quality and the benefits of nasal washing. Review warning signs and symptoms to help prevent infections. Discuss techniques for effective airway clearance, coughing, and vibrations.   AED/CPR: - Group verbal and written instruction with the use of models to demonstrate the basic use of the AED with the basic ABC's of resuscitation.   Pulmonary Rehab from 04/26/2018 in [facility] Cardiac and Pulmonary Rehab  Date  04/26/18  Educator  [facility]  Instruction Review Code  1- Actuary and Physiology of the Lungs: - Group verbal and written instruction with the use of models to provide basic lung anatomy and physiology related to function, structure and complications of lung disease.   Anatomy & Physiology of the Heart: - Group verbal and written instruction and models provide basic cardiac anatomy and physiology, with the coronary electrical and arterial systems. Review of Valvular disease and Heart Failure   Cardiac Medications: - Group verbal and written instruction to review commonly prescribed medications for heart disease. Reviews the medication, class of the drug, and side effects.    Pulmonary Rehab from 04/26/2018 in [facility] Cardiac and Pulmonary Rehab  Date  04/24/18  Educator  [facility]  Instruction Review Code  1- Verbalizes Understanding      Know Your Numbers and Risk Factors: -Group verbal and written instruction about important numbers in your health.  Discussion of what are risk factors and how they play a role in the disease process.  Review of Cholesterol, Blood Pressure, Diabetes, and BMI and the role they play in your overall health.   Sleep Hygiene: -Provides group verbal and written instruction about how sleep can affect your health.  Define sleep hygiene, discuss sleep cycles and impact of sleep habits. Review good sleep hygiene tips.    Other: -Provides group and verbal instruction on various topics (see comments)    Knowledge Questionnaire Score: Knowledge Questionnaire Score - 04/08/18 1359      Knowledge Questionnaire Score   Pre Score  15/18   reviewed with patient       Core Components/Risk Factors/Patient Goals at Admission: Personal Goals and Risk Factors at Admission - 04/08/18 1432      Core Components/Risk Factors/Patient Goals on Admission    Weight Management  Yes;Weight Loss;Obesity    Intervention  Weight Management: Develop a combined nutrition and exercise program designed to reach desired caloric intake, while maintaining appropriate intake of nutrient and fiber, sodium and fats, and appropriate energy expenditure required for the weight goal.;Weight Management: Provide education and appropriate resources to help participant work on and attain dietary goals.;Weight Management/Obesity: Establish reasonable short term and long term weight goals.;Obesity: Provide education and appropriate resources to help participant work on and attain dietary goals.    Admit Weight  210 lb 12.8 oz (95.6 kg)    Goal Weight: Short Term  205 lb (93 kg)    Goal Weight: Long Term  150 lb (68 kg)    Expected Outcomes  Short Term: Continue to assess and modify  interventions until short term weight  is achieved;Long Term: Adherence to nutrition and physical activity/exercise program aimed toward attainment of established weight goal;Weight Maintenance: Understanding of the daily nutrition guidelines, which includes 25-35% calories from fat, 7% or less cal from saturated fats, less than 274m cholesterol, less than 1.5gm of sodium, & 5 or more servings of fruits and vegetables daily;Weight Loss: Understanding of general recommendations for a balanced deficit meal plan, which promotes 1-2 lb weight loss per week and includes a negative energy balance of [phone] kcal/d;Understanding recommendations for meals to include 15-35% energy as protein, 25-35% energy from fat, 35-60% energy from carbohydrates, less than 2010mof dietary cholesterol, 20-35 gm of total fiber daily;Understanding of distribution of calorie intake throughout the day with the consumption of 4-5 meals/snacks    Improve shortness of breath with ADL's  Yes    Intervention  Provide education, individualized exercise plan and daily activity instruction to help decrease symptoms of SOB with activities of daily living.    Expected Outcomes  Short Term: Improve cardiorespiratory fitness to achieve a reduction of symptoms when performing ADLs;Long Term: Be able to perform more ADLs without symptoms or delay the onset of symptoms    Diabetes  Yes    Intervention  Provide education about signs/symptoms and action to take for hypo/hyperglycemia.;Provide education about proper nutrition, including hydration, and aerobic/resistive exercise prescription along with prescribed medications to achieve blood glucose in normal ranges: Fasting glucose 65-99 mg/dL    Expected Outcomes  Short Term: Participant verbalizes understanding of the signs/symptoms and immediate care of hyper/hypoglycemia, proper foot care and importance of medication, aerobic/resistive exercise and nutrition plan for blood glucose control.;Long  Term: Attainment of HbA1C < 7%.    Lipids  Yes    Intervention  Provide education and support for participant on nutrition & aerobic/resistive exercise along with prescribed medications to achieve LDL <7095mHDL >69m64m  Expected Outcomes  Short Term: Participant states understanding of desired cholesterol values and is compliant with medications prescribed. Participant is following exercise prescription and nutrition guidelines.;Long Term: Cholesterol controlled with medications as prescribed, with individualized exercise RX and with personalized nutrition plan. Value goals: LDL < 70mg63mL > 40 mg.       Core Components/Risk Factors/Patient Goals Review:    Core Components/Risk Factors/Patient Goals at Discharge (Final Review):    ITP Comments: ITP Comments    Row Name 04/08/18 1339 05/06/18 0817         ITP Comments  Medical Evaluation completed. Chart sent for review and changes to Dr. Mark Emily Filbertctor of [facility] can be found in [facility] encounter 01/31/18  30 day review completed. ITP sent to Dr. Mark Emily Filbertctor of [facility] with ITP unless changes are made by physician         Comments: 30 day review

## 2018-05-08 DIAGNOSIS — R0602 Shortness of breath: Secondary | ICD-10-CM

## 2018-05-08 NOTE — Progress Notes (Signed)
Daily Session Note  Patient Details  Name: Susan Callahan MRN: [id number] Date of Birth: Mar 04, 1951 Referring Provider:     Pulmonary Rehab from 04/08/2018 in [facility] Cardiac and Pulmonary Rehab  Referring Provider  Jamesetta So MD      Encounter Date: 05/08/2018  Check In: Session Check In - 05/08/18 1027      Check-In   Supervising physician immediately available to respond to emergencies  [facility] immediately available ER MD    Physician(s)  Drs. Domenic Polite    Location  [facility]-Cardiac & Pulmonary Rehab    Staff Present  Alberteen Sam, MA, RCEP, CCRP, Exercise Physiologist;Joseph [employer], [place], ACSM CEP, Exercise Physiologist    Medication changes reported      No    Fall or balance concerns reported     No    Warm-up and Cool-down  Performed as group-led instruction    Resistance Training Performed  Yes    VAD Patient?  No    PAD/SET Patient?  No      Pain Assessment   Currently in Pain?  No/denies          Social History   Tobacco Use  Smoking Status Former Smoker  . Packs/day: 3.00  . Years: 27.00  . Pack years: 81.00  . Types: Cigarettes  . Last attempt to quit: 03/15/1991  . Years since quitting: 27.1  Smokeless Tobacco Never Used    Goals Met:  Proper associated with RPD/PD & O2 Sat Independence with exercise equipment Using PLB without cueing & demonstrates good technique Exercise tolerated well No report of cardiac concerns or symptoms Strength training completed today  Goals Unmet:  Not Applicable  Comments: Pt able to follow exercise prescription today without complaint.  Will continue to monitor for progression.    Dr. Emily Filbert is Medical Director for [place] and [facility].

## 2018-05-10 DIAGNOSIS — R0602 Shortness of breath: Secondary | ICD-10-CM

## 2018-05-10 NOTE — Progress Notes (Signed)
Daily Session Note  Patient Details  Name: Susan Callahan MRN: [id number] Date of Birth: 1951/01/13 Referring Provider:     Pulmonary Rehab from 04/08/2018 in [facility] Cardiac and Pulmonary Rehab  Referring Provider  Jamesetta So MD      Encounter Date: 05/10/2018  Check In: Session Check In - 05/10/18 1015      Check-In   Supervising physician immediately available to respond to emergencies  [facility] immediately available ER MD    Physician(s)  Dr. Alfred Levins and Jimmye Norman    Location  [facility]-Cardiac & Pulmonary Rehab    Staff Present  Justin Mend RCP,RRT,BSRT;Amanda Oletta Darter, [place], ACSM CEP, Exercise Physiologist;Mary Kellie Shropshire, RN, BSN, MA    Medication changes reported      No    Fall or balance concerns reported     No    Warm-up and Cool-down  Performed as group-led instruction    Resistance Training Performed  Yes    VAD Patient?  No      Pain Assessment   Currently in Pain?  No/denies          Social History   Tobacco Use  Smoking Status Former Smoker  . Packs/day: 3.00  . Years: 27.00  . Pack years: 81.00  . Types: Cigarettes  . Last attempt to quit: 03/15/1991  . Years since quitting: 27.1  Smokeless Tobacco Never Used    Goals Met:  Independence with exercise equipment Exercise tolerated well No report of cardiac concerns or symptoms Strength training completed today  Goals Unmet:  Not Applicable  Comments: Pt able to follow exercise prescription today without complaint.  Will continue to monitor for progression.   Dr. Emily Filbert is Medical Director for [place] and [facility].

## 2018-05-15 ENCOUNTER — Encounter: Payer: Medicare Other | Attending: Pulmonary Disease

## 2018-05-15 DIAGNOSIS — Z87891 Personal history of nicotine dependence: Secondary | ICD-10-CM | POA: Insufficient documentation

## 2018-05-15 DIAGNOSIS — R0602 Shortness of breath: Secondary | ICD-10-CM | POA: Insufficient documentation

## 2018-05-15 NOTE — Progress Notes (Signed)
Daily Session Note  Patient Details  Name: Susan Callahan MRN: [id number] Date of Birth: 1951-03-22 Referring Provider:     Pulmonary Rehab from 04/08/2018 in [facility] Cardiac and Pulmonary Rehab  Referring Provider  Jamesetta So MD      Encounter Date: 05/15/2018  Check In: Session Check In - 05/15/18 1134      Check-In   Supervising physician immediately available to respond to emergencies  [facility] immediately available ER MD    Physician(s)  Alfred Levins and Jimmye Norman    Location  [facility]-Cardiac & Pulmonary Rehab    Staff Present  Alberteen Sam, MA, RCEP, CCRP, Exercise Physiologist;Joseph F[place], BA, ACSM CEP, Exercise Physiologist          Social History   Tobacco Use  Smoking Status Former Smoker  . Packs/day: 3.00  . Years: 27.00  . Pack years: 81.00  . Types: Cigarettes  . Last attempt to quit: 03/15/1991  . Years since quitting: 27.1  Smokeless Tobacco Never Used    Goals Met:  Proper associated with RPD/PD & O2 Sat Independence with exercise equipment Exercise tolerated well Strength training completed today  Goals Unmet:  Not Applicable  Comments: Pt able to follow exercise prescription today without complaint.  Will continue to monitor for progression.    Dr. Emily Filbert is Medical Director for [place] and [facility].

## 2018-05-17 ENCOUNTER — Encounter: Payer: Medicare Other | Admitting: *Deleted

## 2018-05-17 DIAGNOSIS — R0602 Shortness of breath: Secondary | ICD-10-CM

## 2018-05-17 NOTE — Progress Notes (Signed)
Daily Session Note  Patient Details  Name: Susan Callahan MRN: [id number] Date of Birth: [birth year] Referring Provider:     Pulmonary Rehab from 04/08/2018 in [facility] Cardiac and Pulmonary Rehab  Referring Provider  Jamesetta So MD      Encounter Date: 05/17/2018  Check In: Session Check In - 05/17/18 1010      Check-In   Supervising physician immediately available to respond to emergencies  [facility] immediately available ER MD    Physician(s)  Dr. Corky Downs and [facility]    Location  [facility]-Cardiac & Pulmonary Rehab    Staff Present  Renita Papa, RN BSN;Jessica Luan Pulling, MA, RCEP, CCRP, Exercise Physiologist;Joseph Tessie Fass RCP,RRT,BSRT    Medication changes reported      No    Fall or balance concerns reported     No    Warm-up and Cool-down  Performed as group-led instruction    Resistance Training Performed  Yes    VAD Patient?  No    PAD/SET Patient?  No      Pain Assessment   Currently in Pain?  No/denies          Social History   Tobacco Use  Smoking Status Former Smoker  . Packs/day: 3.00  . Years: 27.00  . Pack years: 81.00  . Types: Cigarettes  . Last attempt to quit: 03/15/1991  . Years since quitting: 27.1  Smokeless Tobacco Never Used    Goals Met:  Proper associated with RPD/PD & O2 Sat Independence with exercise equipment Using PLB without cueing & demonstrates good technique Exercise tolerated well No report of cardiac concerns or symptoms Strength training completed today  Goals Unmet:  Not Applicable  Comments: Pt able to follow exercise prescription today without complaint.  Will continue to monitor for progression.    Dr. Emily Filbert is Medical Director for [place] and [facility].

## 2018-05-20 ENCOUNTER — Encounter: Payer: Medicare Other | Admitting: *Deleted

## 2018-05-20 DIAGNOSIS — R0602 Shortness of breath: Secondary | ICD-10-CM

## 2018-05-20 NOTE — Progress Notes (Signed)
Daily Session Note  Patient Details  Name: Susan Callahan MRN: [id number] Date of Birth: June 11, 1951 Referring Provider:     Pulmonary Rehab from 04/08/2018 in [facility] Cardiac and Pulmonary Rehab  Referring Provider  Jamesetta So MD      Encounter Date: 05/20/2018  Check In: Session Check In - 05/20/18 1022      Check-In   Supervising physician immediately available to respond to emergencies  [facility] immediately available ER MD    Physician(s)  Dr. Kerman Passey and Dr. Joni Fears    Location  [facility]-Cardiac & Pulmonary Rehab    Staff Present  Earlean Shawl, BS, ACSM CEP, Exercise Physiologist;Amanda Oletta Darter, BA, ACSM CEP, Exercise Physiologist;Joseph Tessie Fass RCP,RRT,BSRT    Medication changes reported      No    Fall or balance concerns reported     No    Tobacco Cessation  No Change    Warm-up and Cool-down  Performed as group-led instruction    Resistance Training Performed  Yes    VAD Patient?  No    PAD/SET Patient?  No      Pain Assessment   Currently in Pain?  No/denies    Multiple Pain Sites  No          Social History   Tobacco Use  Smoking Status Former Smoker  . Packs/day: 3.00  . Years: 27.00  . Pack years: 81.00  . Types: Cigarettes  . Last attempt to quit: 03/15/1991  . Years since quitting: 27.2  Smokeless Tobacco Never Used    Goals Met:  Proper associated with RPD/PD & O2 Sat Independence with exercise equipment Exercise tolerated well No report of cardiac concerns or symptoms Strength training completed today  Goals Unmet:  Not Applicable  Comments: Pt able to follow exercise prescription today without complaint.  Will continue to monitor for progression.    Dr. Emily Filbert is Medical Director for [place] and [facility].

## 2018-05-22 ENCOUNTER — Encounter: Payer: Medicare Other | Admitting: *Deleted

## 2018-05-22 DIAGNOSIS — R0602 Shortness of breath: Secondary | ICD-10-CM

## 2018-05-22 NOTE — Progress Notes (Signed)
Daily Session Note  Patient Details  Name: Ilean Spradlin MRN: [id number] Date of Birth: [birth year] Referring Provider:     Pulmonary Rehab from 04/08/2018 in [facility] Cardiac and Pulmonary Rehab  Referring Provider  Jamesetta So MD      Encounter Date: 05/22/2018  Check In: Session Check In - 05/22/18 1137      Check-In   Supervising physician immediately available to respond to emergencies  [facility] immediately available ER MD    Physician(s)  Drs. Alyse Low    Location  [facility]-Cardiac & Pulmonary Rehab    Staff Present  Alberteen Sam, MA, RCEP, CCRP, Exercise Physiologist;Joseph [place];Heath Lark, RN, BSN, CCRP    Medication changes reported      No    Fall or balance concerns reported     No    Warm-up and Cool-down  Performed as group-led instruction    Resistance Training Performed  Yes    VAD Patient?  No    PAD/SET Patient?  No      Pain Assessment   Currently in Pain?  No/denies          Social History   Tobacco Use  Smoking Status Former Smoker  . Packs/day: 3.00  . Years: 27.00  . Pack years: 81.00  . Types: Cigarettes  . Last attempt to quit: 03/15/1991  . Years since quitting: 27.2  Smokeless Tobacco Never Used    Goals Met:  Proper associated with RPD/PD & O2 Sat Independence with exercise equipment Using PLB without cueing & demonstrates good technique Exercise tolerated well No report of cardiac concerns or symptoms Strength training completed today  Goals Unmet:  Not Applicable  Comments: Pt able to follow exercise prescription today without complaint.  Will continue to monitor for progression.    Dr. Emily Filbert is Medical Director for [place] and [facility].

## 2018-05-24 DIAGNOSIS — R0602 Shortness of breath: Secondary | ICD-10-CM | POA: Diagnosis not present

## 2018-05-24 NOTE — Progress Notes (Signed)
Daily Session Note  Patient Details  Name: Colie Josten MRN: [id number] Date of Birth: 11/28/1950 Referring Provider:     Pulmonary Rehab from 04/08/2018 in [facility] Cardiac and Pulmonary Rehab  Referring Provider  Jamesetta So MD      Encounter Date: 05/24/2018  Check In: Session Check In - 05/24/18 1020      Check-In   Supervising physician immediately available to respond to emergencies  [facility] immediately available ER MD    Physician(s)  Dr. Kerman Passey and [facility]    Location  [facility]-Cardiac & Pulmonary Rehab    Staff Present  Renita Papa, RN BSN;Kirsten Spearing [facility], [place], ACSM CEP, Exercise Physiologist    Medication changes reported      No    Fall or balance concerns reported     No    Tobacco Cessation  No Change    Warm-up and Cool-down  Performed as group-led instruction    Resistance Training Performed  Yes    VAD Patient?  No    PAD/SET Patient?  No      Pain Assessment   Currently in Pain?  No/denies          Social History   Tobacco Use  Smoking Status Former Smoker  . Packs/day: 3.00  . Years: 27.00  . Pack years: 81.00  . Types: Cigarettes  . Last attempt to quit: 03/15/1991  . Years since quitting: 27.2  Smokeless Tobacco Never Used    Goals Met:  Proper associated with RPD/PD & O2 Sat Independence with exercise equipment Using PLB without cueing & demonstrates good technique Exercise tolerated well No report of cardiac concerns or symptoms Strength training completed today  Goals Unmet:  Not Applicable  Comments: Pt able to follow exercise prescription today without complaint.  Will continue to monitor for progression.    Dr. Emily Filbert is Medical Director for [place] and [facility].

## 2018-05-27 ENCOUNTER — Encounter: Payer: Medicare Other | Admitting: *Deleted

## 2018-05-27 DIAGNOSIS — R0602 Shortness of breath: Secondary | ICD-10-CM

## 2018-05-27 NOTE — Progress Notes (Signed)
Daily Session Note  Patient Details  Name: Susan Callahan MRN: [id number] Date of Birth: June 18, 1951 Referring Provider:     Pulmonary Rehab from 04/08/2018 in [facility] Cardiac and Pulmonary Rehab  Referring Provider  Jamesetta So MD      Encounter Date: 05/27/2018  Check In: Session Check In - 05/27/18 1012      Check-In   Supervising physician immediately available to respond to emergencies  [facility] immediately available ER MD    Physician(s)  Dr. Mariea Clonts and Dr. Corky Downs    Location  [facility]-Cardiac & Pulmonary Rehab    Staff Present  Justin Mend Jaci Carrel, BS, ACSM CEP, Exercise Physiologist;Amanda Oletta Darter, [place], ACSM CEP, Exercise Physiologist    Medication changes reported      No    Fall or balance concerns reported     No    Tobacco Cessation  No Change    Warm-up and Cool-down  Performed as group-led instruction    Resistance Training Performed  Yes    VAD Patient?  No    PAD/SET Patient?  No      Pain Assessment   Currently in Pain?  No/denies    Multiple Pain Sites  No          Social History   Tobacco Use  Smoking Status Former Smoker  . Packs/day: 3.00  . Years: 27.00  . Pack years: 81.00  . Types: Cigarettes  . Last attempt to quit: 03/15/1991  . Years since quitting: 27.2  Smokeless Tobacco Never Used    Goals Met:  Proper associated with RPD/PD & O2 Sat Independence with exercise equipment Exercise tolerated well No report of cardiac concerns or symptoms Strength training completed today  Goals Unmet:  Not Applicable  Comments: Pt able to follow exercise prescription today without complaint.  Will continue to monitor for progression.    Dr. Emily Filbert is Medical Director for [place] and [facility].

## 2018-05-29 DIAGNOSIS — R0602 Shortness of breath: Secondary | ICD-10-CM | POA: Diagnosis not present

## 2018-05-29 NOTE — Progress Notes (Signed)
Daily Session Note  Patient Details  Name: Susan Callahan MRN: [id number] Date of Birth: 1951-08-20 Referring Provider:     Pulmonary Rehab from 04/08/2018 in [facility] Cardiac and Pulmonary Rehab  Referring Provider  Jamesetta So MD      Encounter Date: 05/29/2018  Check In: Session Check In - 05/29/18 1025      Check-In   Supervising physician immediately available to respond to emergencies  [facility] immediately available ER MD    Location  [facility]-Cardiac & Pulmonary Rehab    Staff Present  Alberteen Sam, MA, RCEP, CCRP, Exercise Physiologist;Joseph Foy [place], [place], ACSM CEP, Exercise Physiologist    Medication changes reported      No    Fall or balance concerns reported     No    Warm-up and Cool-down  Performed as group-led instruction    Resistance Training Performed  Yes    VAD Patient?  No    PAD/SET Patient?  No      Pain Assessment   Currently in Pain?  No/denies    Multiple Pain Sites  No          Social History   Tobacco Use  Smoking Status Former Smoker  . Packs/day: 3.00  . Years: 27.00  . Pack years: 81.00  . Types: Cigarettes  . Last attempt to quit: 03/15/1991  . Years since quitting: 27.2  Smokeless Tobacco Never Used    Goals Met:  Proper associated with RPD/PD & O2 Sat Independence with exercise equipment Exercise tolerated well Strength training completed today  Goals Unmet:  Not Applicable  Comments: Pt able to follow exercise prescription today without complaint.  Will continue to monitor for progression.    Dr. Emily Filbert is Medical Director for [place] and [facility].

## 2018-05-31 DIAGNOSIS — R0602 Shortness of breath: Secondary | ICD-10-CM | POA: Diagnosis not present

## 2018-05-31 NOTE — Progress Notes (Signed)
Daily Session Note  Patient Details  Name: Susan Callahan MRN: [id number] Date of Birth: 19-Jun-1951 Referring Provider:     Pulmonary Rehab from 04/08/2018 in [facility] Cardiac and Pulmonary Rehab  Referring Provider  Jamesetta So MD      Encounter Date: 05/31/2018  Check In: Session Check In - 05/31/18 1022      Check-In   Supervising physician immediately available to respond to emergencies  [facility] immediately available ER MD    Physician(s)  Dr. Archie Balboa and Quentin Cornwall    Location  [facility]-Cardiac & Pulmonary Rehab    Staff Present  Justin Mend RCP,RRT,BSRT;Amanda Oletta Darter, BA, ACSM CEP, Exercise Physiologist;Meredith Sherryll Burger, RN BSN    Medication changes reported      No    Fall or balance concerns reported     No    Warm-up and Cool-down  Performed as group-led Higher education careers adviser Performed  Yes    VAD Patient?  No      Pain Assessment   Currently in Pain?  No/denies          Social History   Tobacco Use  Smoking Status Former Smoker  . Packs/day: 3.00  . Years: 27.00  . Pack years: 81.00  . Types: Cigarettes  . Last attempt to quit: 03/15/1991  . Years since quitting: 27.2  Smokeless Tobacco Never Used    Goals Met:  Independence with exercise equipment Exercise tolerated well No report of cardiac concerns or symptoms Strength training completed today  Goals Unmet:  Not Applicable  Comments: Pt able to follow exercise prescription today without complaint.  Will continue to monitor for progression.    Dr. Emily Filbert is Medical Director for [place] and [facility].

## 2018-06-03 ENCOUNTER — Encounter: Payer: Medicare Other | Admitting: *Deleted

## 2018-06-03 DIAGNOSIS — R0602 Shortness of breath: Secondary | ICD-10-CM

## 2018-06-03 NOTE — Progress Notes (Signed)
Pulmonary Individual Treatment Plan  Patient Details  Name: Susan Callahan MRN: [id number] Date of Birth: [birth year] Referring Provider:     Pulmonary Rehab from 04/08/2018 in [facility] Cardiac [facility] Pulmonary Rehab  Referring Provider  Jamesetta So MD      Initial Encounter Date:    Pulmonary Rehab from 04/08/2018 in [facility] Cardiac and Pulmonary Rehab  Date  04/08/18      Visit Diagnosis: Shortness of breath  Patient's Home Medications on Admission:  Current Outpatient Medications:  .  acetaZOLAMIDE (DIAMOX) 250 MG tablet, Take by mouth., Disp: , Rfl:  .  albuterol (PROVENTIL HFA;VENTOLIN HFA) 108 (90 Base) MCG/ACT inhaler, Inhale into the lungs., Disp: , Rfl:  .  allopurinol (ZYLOPRIM) 300 MG tablet, Take by mouth., Disp: , Rfl:  .  amitriptyline (ELAVIL) 10 MG tablet, Take by mouth., Disp: , Rfl:  .  aspirin EC 81 MG tablet, Take by mouth., Disp: , Rfl:  .  atorvastatin (LIPITOR) 20 MG tablet, Take by mouth., Disp: , Rfl:  .  b complex vitamins capsule, Take by mouth., Disp: , Rfl:  .  Calcium Carbonate-Vitamin D 600-400 MG-UNIT tablet, Take by mouth., Disp: , Rfl:  .  Coenzyme Q10 100 MG capsule, Take by mouth., Disp: , Rfl:  .  furosemide (LASIX) 40 MG tablet, Take by mouth., Disp: , Rfl:  .  gabapentin (NEURONTIN) 600 MG tablet, Take by mouth., Disp: , Rfl:  .  magnesium oxide (MAG-OX) 400 MG tablet, Take by mouth., Disp: , Rfl:  .  metFORMIN (GLUCOPHAGE-XR) 500 MG 24 hr tablet, Take by mouth., Disp: , Rfl:  .  Multiple Vitamin (MULTIVITAMIN) capsule, Take by mouth., Disp: , Rfl:  .  naproxen (EC NAPROSYN) 500 MG EC tablet, Take one pill bid for one weeks, then prn inflammation, Disp: , Rfl:  .  Omega-3 Fatty Acids ([employer] FISH OIL) 1200 MG CAPS, Take by mouth., Disp: , Rfl:  .  spironolactone (ALDACTONE) 100 MG tablet, Take 100 mg by mouth daily., Disp: , Rfl: 2 .  umeclidinium bromide (INCRUSE ELLIPTA) 62.5 MCG/INH AEPB, Inhale into the lungs., Disp: , Rfl:  .  verapamil  (CALAN-SR) 240 MG CR tablet, Take by mouth., Disp: , Rfl:   Past Medical History: Past Medical History:  Diagnosis Date  . Diabetes mellitus without complication ([place])   . Hypertension   . Migraine   . 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 Packs/day: 3.00  . Years: 27.00  . Pack years: 81.00  . Types: Cigarettes  . Last attempt to quit: 03/15/1991  . Years since quitting: 27.2  Smokeless Tobacco Never Used    Labs: Recent Review Flowsheet Data    There is no flowsheet data to display.       Pulmonary Assessment Scores: Pulmonary Assessment Scores    Row Name 04/08/18 1359         ADL UCSD   ADL Phase  Entry     SOB Score total  37     Rest  1     Walk  1     Stairs  4     Bath  0     Dress  0     Shop  2       CAT Score   CAT Score  19       mMRC Score   mMRC Score  1  Pulmonary Function Assessment: Pulmonary Function Assessment - 04/08/18 1402      Initial Spirometry Results   FVC%  99.5 %    FEV1%  80.5 %    FEV1/FVC Ratio  68    Comments  Test results from [facility] 04/03/18      Post Bronchodilator Spirometry Results   FVC%  99 %    FEV1%  85.5 %    FEV1/FVC Ratio  70    Comments  Test results from [facility] 04/03/18      Breath   Bilateral Breath Sounds  Clear    Shortness of Breath  Yes;Limiting activity       Exercise Target Goals: Exercise Program Goal: Individual exercise prescription set using results from initial 6 min walk test and THRR while considering  patient's activity barriers and safety.   Exercise Prescription Goal: Initial exercise prescription builds to 30-45 minutes a day of aerobic activity, 2-3 days per week.  Home exercise guidelines will be given to patient during program as part of exercise prescription that the participant will acknowledge.  Activity Barriers & Risk Stratification: Activity Barriers & Cardiac Risk  Stratification - 04/08/18 1432      Activity Barriers & Cardiac Risk Stratification   Activity Barriers  Balance Concerns;Muscular Weakness;Deconditioning;Shortness of Breath;Back Problems       6 Minute Walk: 6 Minute Walk    Row Name 04/08/18 1429         6 Minute Walk   Phase  Initial     Distance  1475 feet     Walk Time  6 minutes     # of Rest Breaks  0     MPH  2.79     METS  3.25     RPE  13     Perceived Dyspnea   3     VO2 Peak  11.39     Symptoms  Yes (comment)     Comments  SOB     Resting HR  68 bpm     Resting BP  144/72     Resting Oxygen Saturation   95 %     Exercise Oxygen Saturation  during 6 min walk  87 %     Max Ex. HR  112 bpm     Max Ex. BP  154/86     2 Minute Post BP  146/80       Interval HR   1 Minute HR  83     2 Minute HR  108     3 Minute HR  106     4 Minute HR  111     5 Minute HR  114     6 Minute HR  112     2 Minute Post HR  81     Interval Heart Rate?  Yes       Interval Oxygen   Interval Oxygen?  Yes     Baseline Oxygen Saturation %  95 %     1 Minute Oxygen Saturation %  91 %     1 Minute Liters of Oxygen  0 L Room Air     2 Minute Oxygen Saturation %  91 %     2 Minute Liters of Oxygen  0 L     3 Minute Oxygen Saturation %  87 %     3 Minute Liters of Oxygen  0 L     4 Minute Oxygen Saturation %  93 %     4 Minute Liters of Oxygen  0 L     5 Minute Oxygen Saturation %  93 %     5 Minute Liters of Oxygen  0 L     6 Minute Oxygen Saturation %  94 %     6 Minute Liters of Oxygen  0 L     2 Minute Post Oxygen Saturation %  96 %     2 Minute Post Liters of Oxygen  0 L       Oxygen Initial Assessment: Oxygen Initial Assessment - 04/08/18 1430      Home Oxygen   Home Oxygen Device  None    Sleep Oxygen Prescription  None    Home Exercise Oxygen Prescription  None    Home at Rest Exercise Oxygen Prescription  None      Initial 6 min Walk   Oxygen Used  None      Program Oxygen Prescription   Program Oxygen  Prescription  None      Intervention   Short Term Goals  To learn and understand importance of maintaining oxygen saturations>88%;To learn and demonstrate proper pursed lip breathing techniques or other breathing techniques.;To learn and demonstrate proper use of respiratory medications;To learn and understand importance of monitoring SPO2 with pulse oximeter and demonstrate accurate use of the pulse oximeter.   Her inhaler was 300 dollars and cannot afford her inhaler.   Long  Term Goals  Verbalizes importance of monitoring SPO2 with pulse oximeter and return demonstration;Maintenance of O2 saturations>88%;Exhibits proper breathing techniques, such as pursed lip breathing or other method taught during program session;Demonstrates proper use of MDI's;Compliance with respiratory medication       Oxygen Re-Evaluation: Oxygen Re-Evaluation    Row Name 04/15/18 1039 05/15/18 1459           Program Oxygen Prescription   Program Oxygen Prescription  None  None        Home Oxygen   Home Oxygen Device  None  None      Sleep Oxygen Prescription  None  None      Home Exercise Oxygen Prescription  None  None      Home at Rest Exercise Oxygen Prescription  None  None        Goals/Expected Outcomes   Short Term Goals  To learn and understand importance of maintaining oxygen saturations>88%;To learn and demonstrate proper pursed lip breathing techniques or other breathing techniques.;To learn and demonstrate proper use of respiratory medications;To learn and understand importance of monitoring SPO2 with pulse oximeter and demonstrate accurate use of the pulse oximeter.  To learn and understand importance of maintaining oxygen saturations>88%;To learn and demonstrate proper pursed lip breathing techniques or other breathing techniques.;To learn and demonstrate proper use of respiratory medications;To learn and understand importance of monitoring SPO2 with pulse oximeter and demonstrate accurate use of  the pulse oximeter.      Long  Term Goals  Verbalizes importance of monitoring SPO2 with pulse oximeter and return demonstration;Maintenance of O2 saturations>88%;Exhibits proper breathing techniques, such as pursed lip breathing or other method taught during program session;Demonstrates proper use of MDI's;Compliance with respiratory medication  Verbalizes importance of monitoring SPO2 with pulse oximeter and return demonstration;Maintenance of O2 saturations>88%;Exhibits proper breathing techniques, such as pursed lip breathing or other method taught during program session;Demonstrates proper use of MDI's;Compliance with respiratory medication      Comments  Reviewed PLB technique with pt.  Talked about how it  work and it's important to maintaining his exercise saturations.    Patient states that she does not have a pulse oximeter at home. She also need to work on PLB more when she gets short of breath. She states she forgets to do PLB when she is at home.      Goals/Expected Outcomes  Short: Become more profiecient at using PLB.   Long: Become independent at using PLB.  Short: monitor oxygen at home with exertion when she has a pulse oximeter. Long: maintain oxygen saturations above 88 percent independently.         Oxygen Discharge (Final Oxygen Re-Evaluation): Oxygen Re-Evaluation - 05/15/18 1459      Program Oxygen Prescription   Program Oxygen Prescription  None      Home Oxygen   Home Oxygen Device  None    Sleep Oxygen Prescription  None    Home Exercise Oxygen Prescription  None    Home at Rest Exercise Oxygen Prescription  None      Goals/Expected Outcomes   Short Term Goals  To learn and understand importance of maintaining oxygen saturations>88%;To learn and demonstrate proper pursed lip breathing techniques or other breathing techniques.;To learn and demonstrate proper use of respiratory medications;To learn and understand importance of monitoring SPO2 with pulse oximeter and  demonstrate accurate use of the pulse oximeter.    Long  Term Goals  Verbalizes importance of monitoring SPO2 with pulse oximeter and return demonstration;Maintenance of O2 saturations>88%;Exhibits proper breathing techniques, such as pursed lip breathing or other method taught during program session;Demonstrates proper use of MDI's;Compliance with respiratory medication    Comments  Patient states that she does not have a pulse oximeter at home. She also need to work on PLB more when she gets short of breath. She states she forgets to do PLB when she is at home.    Goals/Expected Outcomes  Short: monitor oxygen at home with exertion when she has a pulse oximeter. Long: maintain oxygen saturations above 88 percent independently.       Initial Exercise Prescription: Initial Exercise Prescription - 04/08/18 1400      Date of Initial Exercise RX and Referring Provider   Date  04/08/18    Referring Provider  Jamesetta So MD      Treadmill   MPH  2.5    Grade  1    Minutes  15    METs  3.26      Recumbant Bike   Level  2    RPM  50    Watts  38    Minutes  15    METs  3.2      NuStep   Level  3    SPM  80    Minutes  15    METs  3      Prescription Details   Frequency (times per week)  3    Duration  Progress to 45 minutes of aerobic exercise without signs/symptoms of physical distress      Intensity   THRR 40-80% of Max Heartrate  102-136    Ratings of Perceived Exertion  11-13    Perceived Dyspnea  0-4      Progression   Progression  Continue to progress workloads to maintain intensity without signs/symptoms of physical distress.      Resistance Training   Training Prescription  Yes    Weight  3 lbs    Reps  10-15       Perform Capillary  Blood Glucose checks as needed.  Exercise Prescription Changes: Exercise Prescription Changes    Row Name 04/08/18 1400 04/24/18 1200 05/08/18 1200 05/23/18 0800       Response to Exercise   Blood Pressure (Admit)  144/72   114/68  128/63  126/72    Blood Pressure (Exercise)  154/86  -  -  -    Blood Pressure (Exit)  146/80  120/72  130/64  124/64    Heart Rate (Admit)  68 bpm  108 bpm  78 bpm  76 bpm    Heart Rate (Exercise)  114 bpm  76 bpm  88 bpm  98 bpm    Heart Rate (Exit)  81 bpm  69 bpm  67 bpm  78 bpm    Oxygen Saturation (Admit)  95 %  92 %  93 %  93 %    Oxygen Saturation (Exercise)  87 %  95 %  94 %  95 %    Oxygen Saturation (Exit)  96 %  92 %  94 %  94 %    Rating of Perceived Exertion (Exercise)  '13  13  13  13    '$ Perceived Dyspnea (Exercise)  '3  2  2  2    '$ Symptoms  SOB  -  -  -    Comments  walk test results  -  -  -    Duration  -  Continue with 45 min of aerobic exercise without signs/symptoms of physical distress.  Continue with 45 min of aerobic exercise without signs/symptoms of physical distress.  Continue with 45 min of aerobic exercise without signs/symptoms of physical distress.    Intensity  -  THRR unchanged  THRR unchanged  THRR unchanged      Progression   Progression  -  Continue to progress workloads to maintain intensity without signs/symptoms of physical distress.  Continue to progress workloads to maintain intensity without signs/symptoms of physical distress.  Continue to progress workloads to maintain intensity without signs/symptoms of physical distress.    Average METs  -  2.63  2.8  2.98      Resistance Training   Training Prescription  -  Yes  Yes  Yes    Weight  -  3 lb  4 lb  4 lb    Reps  -  10-15  10-15  10-15      Interval Training   Interval Training  -  No  No  No      Treadmill   MPH  -  2.5  2.5  2.5    Grade  -  '1  1  1    '$ Minutes  -  '15  15  15    '$ METs  -  3.26  3.26  3.26      Recumbant Bike   Level  -  -  -  2    RPM  -  -  -  60    Watts  -  -  -  23    Minutes  -  -  -  15    METs  -  -  -  2.7      NuStep   Level  -  3  5  -    SPM  -  80  80  -    Minutes  -  15  15  -    METs  -  2  2.2  -  Home Exercise Plan   Plans to  continue exercise at  -  [place]    Frequency  -  Add 1 additional day to program exercise sessions.  Add 1 additional day to program exercise sessions.  Add 1 additional day to program exercise sessions.    Initial Home Exercises Provided  -  04/24/18  04/24/18  04/24/18       Exercise Comments: Exercise Comments    Row Name 04/15/18 1023 04/24/18 1143         Exercise Comments   First full day of exercise!  Patient was oriented to gym and equipment including functions, settings, policies, and procedures.  Patient's individual exercise prescription and treatment plan were reviewed.  All starting workloads were established based on the results of the 6 minute walk test done at initial orientation visit.  The plan for exercise progression was also introduced and progression will be customized based on patient's performance and goals.  Reviewed home exercise with pt today.  Pt plans to walk and consider [place] for exercise.  Reviewed THR, pulse, RPE, sign and symptoms, NTG use, and when to call 911 or MD.  Also discussed weather considerations and indoor options.  Pt voiced understanding.         Exercise Goals and Review: Exercise Goals    Row Name 04/08/18 1435             Exercise Goals   Increase Physical Activity  Yes       Intervention  Provide advice, education, support and counseling about physical activity/exercise needs.;Develop an individualized exercise prescription for aerobic and resistive training based on initial evaluation findings, risk stratification, comorbidities and participant's personal goals.       Expected Outcomes  Short Term: Attend rehab on a regular basis to increase amount of physical activity.;Long Term: Add in home exercise to make exercise part of routine and to increase amount of physical activity.;Long Term: Exercising regularly at least 3-5 days a week.       Increase Strength and Stamina  Yes       Intervention   Provide advice, education, support and counseling about physical activity/exercise needs.;Develop an individualized exercise prescription for aerobic and resistive training based on initial evaluation findings, risk stratification, comorbidities and participant's personal goals.       Expected Outcomes  Short Term: Increase workloads from initial exercise prescription for resistance, speed, and METs.;Short Term: Perform resistance training exercises routinely during rehab and add in resistance training at home;Long Term: Improve cardiorespiratory fitness, muscular endurance and strength as measured by increased METs and functional capacity (6MWT)       Able to understand and use rate of perceived exertion (RPE) scale  Yes       Intervention  Provide education and explanation on how to use RPE scale       Expected Outcomes  Short Term: Able to use RPE daily in rehab to express subjective intensity level;Long Term:  Able to use RPE to guide intensity level when exercising independently       Able to understand and use Dyspnea scale  Yes       Intervention  Provide education and explanation on how to use Dyspnea scale       Expected Outcomes  Short Term: Able to use Dyspnea scale daily in rehab to express subjective sense of shortness of breath during exertion;Long Term: Able to use Dyspnea scale to guide intensity level  when exercising independently       Knowledge and understanding of Target Heart Rate Range (THRR)  Yes       Intervention  Provide education and explanation of THRR including how the numbers were predicted and where they are located for reference       Expected Outcomes  Short Term: Able to state/look up THRR;Short Term: Able to use daily as guideline for intensity in rehab;Long Term: Able to use THRR to govern intensity when exercising independently       Able to check pulse independently  Yes       Intervention  Provide education and demonstration on how to check pulse in carotid and  radial arteries.;Review the importance of being able to check your own pulse for safety during independent exercise       Expected Outcomes  Short Term: Able to explain why pulse checking is important during independent exercise;Long Term: Able to check pulse independently and accurately       Understanding of Exercise Prescription  Yes       Intervention  Provide education, explanation, and written materials on patient's individual exercise prescription       Expected Outcomes  Short Term: Able to explain program exercise prescription;Long Term: Able to explain home exercise prescription to exercise independently          Exercise Goals Re-Evaluation : Exercise Goals Re-Evaluation    Row Name 04/15/18 1023 04/24/18 1143 04/24/18 1248 05/08/18 1220 05/23/18 0812     Exercise Goal Re-Evaluation   Exercise Goals Review  Increase Physical Activity;Increase Strength and Stamina;Able to understand and use rate of perceived exertion (RPE) scale;Knowledge and understanding of Target Heart Rate Range (THRR);Able to understand and use Dyspnea scale;Understanding of Exercise Prescription  Increase Physical Activity;Increase Strength and Stamina;Able to understand and use rate of perceived exertion (RPE) scale;Able to understand and use Dyspnea scale;Knowledge and understanding of Target Heart Rate Range (THRR);Able to check pulse independently  Increase Physical Activity;Increase Strength and Stamina;Able to understand and use rate of perceived exertion (RPE) scale;Able to understand and use Dyspnea scale;Knowledge and understanding of Target Heart Rate Range (THRR)  Increase Physical Activity;Increase Strength and Stamina;Able to understand and use rate of perceived exertion (RPE) scale;Able to understand and use Dyspnea scale  Increase Physical Activity;Increase Strength and Stamina;Able to understand and use rate of perceived exertion (RPE) scale;Able to understand and use Dyspnea scale   Comments  Reviewed  RPE scale, THR and program prescription with pt today.  Pt voiced understanding and was given a copy of goals to take home.   Reviewed home exercise with pt today.  Pt plans to walk and consider [place] for exercise.  Reviewed THR, pulse, RPE, sign and symptoms, NTG use, and when to call 911 or MD.  Also discussed weather considerations and indoor options.  Pt voiced understanding.  -  Susan Callahan has increased level on NS and overall MET level.  She has increased to 4 lb for strength work.  Staff will monitor progress.  Susan Callahan is meeting RPE goals and has increased MET level slightly.  Staff will encourage increasing level on bike.   Expected Outcomes  Short: Use RPE daily to regulate intensity. Long: Follow program prescription in THR.  Short - add one day of walking in addition to program sessions and research options like FF Long - maintain exercise on her own  -  Short - continue to attend consistently Long - improve MET level  Short - attend consistently  Long - improve MET level      Discharge Exercise Prescription (Final Exercise Prescription Changes): Exercise Prescription Changes - 05/23/18 0800      Response to Exercise   Blood Pressure (Admit)  126/72    Blood Pressure (Exit)  124/64    Heart Rate (Admit)  76 bpm    Heart Rate (Exercise)  98 bpm    Heart Rate (Exit)  78 bpm    Oxygen Saturation (Admit)  93 %    Oxygen Saturation (Exercise)  95 %    Oxygen Saturation (Exit)  94 %    Rating of Perceived Exertion (Exercise)  13    Perceived Dyspnea (Exercise)  2    Duration  Continue with 45 min of aerobic exercise without signs/symptoms of physical distress.    Intensity  THRR unchanged      Progression   Progression  Continue to progress workloads to maintain intensity without signs/symptoms of physical distress.    Average METs  2.98      Resistance Training   Training Prescription  Yes    Weight  4 lb    Reps  10-15      Interval Training   Interval Training  No       Treadmill   MPH  2.5    Grade  1    Minutes  15    METs  3.26      Recumbant Bike   Level  2    RPM  60    Watts  23    Minutes  15    METs  2.7      Home Exercise Plan   Plans to continue exercise at  [employer]    Frequency  Add 1 additional day to program exercise sessions.    Initial Home Exercises Provided  04/24/18       Nutrition:  Target Goals: Understanding of nutrition guidelines, daily intake of sodium '1500mg'$ , cholesterol '200mg'$ , calories 30% from fat and 7% or less from saturated fats, daily to have 5 or more servings of fruits and vegetables.  Biometrics: Pre Biometrics - 04/08/18 1435      Pre Biometrics   Height  5' 5.1" (1.654 m)    Weight  210 lb 12.8 oz (95.6 kg)    Waist Circumference  43 inches    Hip Circumference  44 inches    Waist to Hip Ratio  0.98 %    BMI (Calculated)  34.95    Single Leg Stand  0.72 seconds        Nutrition Therapy Plan and Nutrition Goals: Nutrition Therapy & Goals - 04/17/18 1414      Nutrition Therapy   Diet  TLC    Drug/Food Interactions  Statins/Certain Fruits   reports to consume grapefruit with cottage cheese occasionally   Protein (specify units)  10oz    Fiber  25 grams    Whole Grain Foods  2 servings   3 ideal, not regularly consuming whole grain options   Saturated Fats  12 max. grams    Fruits and Vegetables  5 servings/day   8 ideal. eats 2 meals/day   Sodium  2000 grams      Personal Nutrition Goals   Nutrition Goal  When choosing frozen meals, choose options with no more than '600mg'$  of sodium per meal    Personal Goal #2  Opt for frozen over canned vegetables when possible    Personal Goal #3  Eat breakfast on  a more consistent schedule, particularly on the days when you are planning to exercise    Comments  She is on a tight budget but is seeking to eat healthier. Does not follow a diabetic diet and consumes a diet high in frozen / pre-prepared foods       Intervention Plan   Intervention   Prescribe, educate and counsel regarding individualized specific dietary modifications aiming towards targeted core components such as weight, hypertension, lipid management, diabetes, heart failure and other comorbidities.;Nutrition handout(s) given to patient.   Spice it up salt-free seasoning recipe sheet, Low sodium nutrition therapy   Expected Outcomes  Short Term Goal: Understand basic principles of dietary content, such as calories, fat, sodium, cholesterol and nutrients.;Short Term Goal: A plan has been developed with personal nutrition goals set during dietitian appointment.       Nutrition Assessments: Nutrition Assessments - 04/08/18 1359      MEDFICTS Scores   Pre Score  28       Nutrition Goals Re-Evaluation: Nutrition Goals Re-Evaluation    [place] Name 04/17/18 1437 05/15/18 1508           Goals   Current Weight  -  212 lb (96.2 kg)      Nutrition Goal  When choosing frozen meals, choose options with no more than '600mg'$  of sodium per meal  Eat healthier and lose weight      Comment  She consumes frozen meals regularly and does not always pay attention to sodium content  Patient is trying to eat a heart healthy diet. She has lost a few pounds and trying to keep the junk food out of the house      Expected Outcome  She will choose frozen meals with less than '600mg'$  of sodium, ideally with at least 15g protein and greater than 4g fiber   Short: lose more weight. Long: Maintain weight loss by dieting independently        Personal Goal #2 Re-Evaluation   Personal Goal #2  Opt for frozen over canned vegetables when possible  -        Personal Goal #3 Re-Evaluation   Personal Goal #3  Eat breakfast on a more consistent schedule, particularly on the days when you are planning to exercise  -         Nutrition Goals Discharge (Final Nutrition Goals Re-Evaluation): Nutrition Goals Re-Evaluation - 05/15/18 1508      Goals   Current Weight  212 lb (96.2 kg)    Nutrition Goal  Eat  healthier and lose weight    Comment  Patient is trying to eat a heart healthy diet. She has lost a few pounds and trying to keep the junk food out of the house    Expected Outcome  Short: lose more weight. Long: Maintain weight loss by dieting independently       Psychosocial: Target Goals: Acknowledge presence or absence of significant depression and/or stress, maximize coping skills, provide positive support system. Participant is able to verbalize types and ability to use techniques and skills needed for reducing stress and depression.   Initial Review & Psychosocial Screening: Initial Psych Review & Screening - 04/08/18 1422      Initial Review   Current issues with  Current Depression;History of Depression;Current Stress Concerns    Source of Stress Concerns  Financial;Retirement/disability;Unable to perform yard/household activities    Comments  She has not planed to be this disabled when she is retired. She is unable to  do yardwork and do the things she used to do.      Family Dynamics   Good Support System?  No    Concerns  No support system    Comments  She states her sister is sort of there but has health issues and things she is dealing with.      Barriers   Psychosocial barriers to participate in program  The patient should benefit from training in stress management and relaxation.      Screening Interventions   Interventions  Encouraged to exercise;Program counselor consult;To provide support and resources with identified psychosocial needs;Provide feedback about the scores to participant    Expected Outcomes  Short Term goal: Utilizing psychosocial counselor, staff and physician to assist with identification of specific Stressors or current issues interfering with healing process. Setting desired goal for each stressor or current issue identified.;Long Term Goal: Stressors or current issues are controlled or eliminated.;Short Term goal: Identification and review with  participant of any Quality of Life or Depression concerns found by scoring the questionnaire.;Long Term goal: The participant improves quality of Life and PHQ9 Scores as seen by post scores and/or verbalization of changes       Quality of Life Scores:  Scores of 19 and below usually indicate a poorer quality of life in these areas.  A difference of  2-3 points is a clinically meaningful difference.  A difference of 2-3 points in the total score of the Quality of Life Index has been associated with significant improvement in overall quality of life, self-image, physical symptoms, and general health in studies assessing change in quality of life.  PHQ-9: Recent Review Flowsheet Data    Depression screen [facility] 2/9 04/08/2018   Decreased Interest 1   Down, Depressed, Hopeless 1   PHQ - 2 Score 2   Altered sleeping 0   Tired, decreased energy 2   Change in appetite 1   Feeling bad or failure about yourself  1   Trouble concentrating 0   Moving slowly or fidgety/restless 0   Suicidal thoughts 0   PHQ-9 Score 6   Difficult doing work/chores Somewhat difficult     Interpretation of Total Score  Total Score Depression Severity:  1-4 = Minimal depression, 5-9 = Mild depression, 10-14 = Moderate depression, 15-19 = Moderately severe depression, 20-27 = Severe depression   Psychosocial Evaluation and Intervention: Psychosocial Evaluation - 04/15/18 1042      Psychosocial Evaluation & Interventions   Interventions  Encouraged to exercise with the program and follow exercise prescription;Stress management education    Comments  Counselor met with Susan Callahan) today for initial psychosocial evaluation.  She is a 67 year old who has COPD.  Makiah has a limited support system as she lives alone and has a sister in [place].  She reports sleeping well and having an "okay" appetite - wherein she has begun losing weight recently - which she states is a "good thing."  Susan Callahan has a long history of depression  and is on medications that help with this.  She reports her mood is stable and she has multiple stressors with her health and finances and a limited support system.  Her goals for this program are to be able to return to more normal activities without SOB; to walk more easily; to increase her energy and stamina and to lose more weight.  Staff will follow with Susan Callahan.    Expected Outcomes  Short:  Susan Callahan will meet with the dietician  to address her weight loss goals.  She will also exercise more consistently to improve her energy; stamina and mood.   Long:  Susan Callahan will develop a routine of positive self-care for her health and mental health.    Continue Psychosocial Services   Follow up required by staff       Psychosocial Re-Evaluation: Psychosocial Re-Evaluation    [facility] Name 04/22/18 1123 05/15/18 1503           Psychosocial Re-Evaluation   Current issues with  Current Stress Concerns  Current Stress Concerns;Current Depression      Comments  Counselor followed up with Susan Callahan today reporting she is frustrated with the staff in that she cannot remember how to work the machines (with her cognitive issues) and she hates to keep asking them to help.  Susan Callahan states she is beginning to take notes on how to use the machines in this class for her benefit in the future.  Counselor commended her positive self-care and being proactive re: her needs.    Patient states that sometimes she does feel depressed. She had shingles in her ear and it affected her cognitive abilities. She does not have any family here to rely on and she lives alone. It is hard for her to do things becuase she is on a fixed income.      Expected Outcomes  Short:  Susan Callahan will develop independent skills in her self-care by making notes on the machines she uses and how to operate.   Long:  Susan Callahan will continue to exercise consistently to achieve her stated goals of weight loss and increased energy/stamina and improved mood.   Short: Attend [facility]  stress management education to decrease stress. Long: Maintain exercise Post [facility] to keep stress at a minimum.      Interventions  -  Encouraged to attend Pulmonary Rehabilitation for the exercise      Continue Psychosocial Services   Follow up required by staff  Follow up required by counselor         Psychosocial Discharge (Final Psychosocial Re-Evaluation): Psychosocial Re-Evaluation - 05/15/18 1503      Psychosocial Re-Evaluation   Current issues with  Current Stress Concerns;Current Depression    Comments  Patient states that sometimes she does feel depressed. She had shingles in her ear and it affected her cognitive abilities. She does not have any family here to rely on and she lives alone. It is hard for her to do things becuase she is on a fixed income.    Expected Outcomes  Short: Attend [facility] stress management education to decrease stress. Long: Maintain exercise [facility] to keep stress at a minimum.    Interventions  Encouraged to attend Pulmonary Rehabilitation for the exercise    Continue Psychosocial Services   Follow up required by counselor       Education: Education Goals: Education classes will be provided on a weekly basis, covering required topics. Participant will state understanding/return demonstration of topics presented.  Learning Barriers/Preferences: Learning Barriers/Preferences - 04/08/18 1428      Learning Barriers/Preferences   Learning Barriers  None    Learning Preferences  None       Education Topics:  Initial Evaluation Education: - Verbal, written and demonstration of respiratory meds, oximetry and breathing techniques. Instruction on use of nebulizers and MDIs and importance of monitoring MDI activations.   Pulmonary Rehab from 05/29/2018 in [facility] Cardiac and Pulmonary Rehab  Date  04/08/18  Educator  [facility]  Instruction Review  Code  1- Verbalizes Understanding      General Nutrition Guidelines/Fats and Fiber: -Group instruction  provided by verbal, written material, models and posters to present the general guidelines for heart healthy nutrition. Gives an explanation and review of dietary fats and fiber.   Pulmonary Rehab from 05/29/2018 in [facility] Cardiac and Pulmonary Rehab  Date  05/22/18  Educator  [facility]  Instruction Review Code  1- Verbalizes Understanding      Controlling Sodium/Reading Food Labels: -Group verbal and written material supporting the discussion of sodium use in heart healthy nutrition. Review and explanation with models, verbal and written materials for utilization of the food label.   Pulmonary Rehab from 05/29/2018 in [facility] Cardiac and Pulmonary Rehab  Date  05/29/18  Educator  [facility]  Instruction Review Code  1- Verbalizes Understanding      Exercise Physiology & General Exercise Guidelines: - Group verbal and written instruction with models to review the exercise physiology of the cardiovascular system and associated critical values. Provides general exercise guidelines with specific guidelines to those with heart or lung disease.    Aerobic Exercise & Resistance Training: - Gives group verbal and written instruction on the various components of exercise. Focuses on aerobic and resistive training programs and the benefits of this training and how to safely progress through these programs.   Flexibility, Balance, Mind/Body Relaxation: Provides group verbal/written instruction on the benefits of flexibility and balance training, including mind/body exercise modes such as yoga, pilates and tai chi.  Demonstration and skill practice provided.   Stress and Anxiety: - Provides group verbal and written instruction about the health risks of elevated stress and causes of high stress.  Discuss the correlation between heart/lung disease and anxiety and treatment options. Review healthy ways to manage with stress and anxiety.   Pulmonary Rehab from 04/17/2018 in [facility] Cardiac and Pulmonary Rehab  Date  04/17/18   Educator  [facility]  Instruction Review Code  1- Verbalizes Understanding      Depression: - Provides group verbal and written instruction on the correlation between heart/lung disease and depressed mood, treatment options, and the stigmas associated with seeking treatment.   Exercise & Equipment Safety: - Individual verbal instruction and demonstration of equipment use and safety with use of the equipment.   Pulmonary Rehab from 05/29/2018 in [facility] Cardiac and Pulmonary Rehab  Date  04/08/18  Educator  [facility]  Instruction Review Code  1- Verbalizes Understanding      Infection Prevention: - Provides verbal and written material to individual with discussion of infection control including proper hand washing and proper equipment cleaning during exercise session.   Pulmonary Rehab from 05/29/2018 in [facility] Cardiac and Pulmonary Rehab  Date  04/08/18  Educator  [facility]  Instruction Review Code  1- Verbalizes Understanding      Falls Prevention: - Provides verbal and written material to individual with discussion of falls prevention and safety.   Pulmonary Rehab from 05/29/2018 in [facility] Cardiac and Pulmonary Rehab  Date  04/08/18  Educator  [facility]  Instruction Review Code  1- Verbalizes Understanding      Diabetes: - Individual verbal and written instruction to review signs/symptoms of diabetes, desired ranges of glucose level fasting, after meals and with exercise. Advice that pre and post exercise glucose checks will be done for 3 sessions at entry of program.   Chronic Lung Diseases: - Group verbal and written instruction to review updates, respiratory medications, advancements in procedures and treatments. Discuss use of supplemental oxygen including  available portable oxygen systems, continuous and intermittent flow rates, concentrators, personal use and safety guidelines. Review proper use of inhaler and spacers. Provide informative websites for self-education.    Pulmonary Rehab from 05/29/2018 in  [facility] Cardiac and Pulmonary Rehab  Date  05/24/18  Educator  [facility]  Instruction Review Code  1- Verbalizes Understanding      Energy Conservation: - Provide group verbal and written instruction for methods to conserve energy, plan and organize activities. Instruct on pacing techniques, use of adaptive equipment and posture/positioning to relieve shortness of breath.   Triggers and Exacerbations: - Group verbal and written instruction to review types of environmental triggers and ways to prevent exacerbations. Discuss weather changes, air quality and the benefits of nasal washing. Review warning signs and symptoms to help prevent infections. Discuss techniques for effective airway clearance, coughing, and vibrations.   AED/CPR: - Group verbal and written instruction with the use of models to demonstrate the basic use of the AED with the basic ABC's of resuscitation.   Pulmonary Rehab from 05/29/2018 in [facility] Cardiac and Pulmonary Rehab  Date  04/26/18  Educator  [facility]  Instruction Review Code  1- Actuary and Physiology of the Lungs: - Group verbal and written instruction with the use of models to provide basic lung anatomy and physiology related to function, structure and complications of lung disease.   Pulmonary Rehab from 05/29/2018 in [facility] Cardiac and Pulmonary Rehab  Date  05/08/18  Educator  [facility]  Instruction Review Code  1- Verbalizes Understanding      Anatomy & Physiology of the Heart: - Group verbal and written instruction and models provide basic cardiac anatomy and physiology, with the coronary electrical and arterial systems. Review of Valvular disease and Heart Failure   Cardiac Medications: - Group verbal and written instruction to review commonly prescribed medications for heart disease. Reviews the medication, class of the drug, and side effects.   Pulmonary Rehab from 05/29/2018 in [facility] Cardiac and Pulmonary Rehab  Date  04/24/18  Educator   [facility]  Instruction Review Code  1- Verbalizes Understanding      Know Your Numbers and Risk Factors: -Group verbal and written instruction about important numbers in your health.  Discussion of what are risk factors and how they play a role in the disease process.  Review of Cholesterol, Blood Pressure, Diabetes, and BMI and the role they play in your overall health.   Sleep Hygiene: -Provides group verbal and written instruction about how sleep can affect your health.  Define sleep hygiene, discuss sleep cycles and impact of sleep habits. Review good sleep hygiene tips.    Pulmonary Rehab from 05/29/2018 in [facility] Cardiac and Pulmonary Rehab  Date  05/15/18  Educator  [facility]  Instruction Review Code  1- Verbalizes Understanding      Other: -Provides group and verbal instruction on various topics (see comments)    Knowledge Questionnaire Score: Knowledge Questionnaire Score - 04/08/18 1359      Knowledge Questionnaire Score   Pre Score  15/18   reviewed with patient       Core Components/Risk Factors/Patient Goals at Admission: Personal Goals and Risk Factors at Admission - 04/08/18 1432      Core Components/Risk Factors/Patient Goals on Admission    Weight Management  Yes;Weight Loss;Obesity    Intervention  Weight Management: Develop a combined nutrition and exercise program designed to reach desired caloric intake, while maintaining appropriate intake of nutrient and fiber,  sodium and fats, and appropriate energy expenditure required for the weight goal.;Weight Management: Provide education and appropriate resources to help participant work on and attain dietary goals.;Weight Management/Obesity: Establish reasonable short term and long term weight goals.;Obesity: Provide education and appropriate resources to help participant work on and attain dietary goals.    Admit Weight  210 lb 12.8 oz (95.6 kg)    Goal Weight: Short Term  205 lb (93 kg)    Goal Weight: Long Term  150 lb (68 kg)     Expected Outcomes  Short Term: Continue to assess and modify interventions until short term weight is achieved;Long Term: Adherence to nutrition and physical activity/exercise program aimed toward attainment of established weight goal;Weight Maintenance: Understanding of the daily nutrition guidelines, which includes 25-35% calories from fat, 7% or less cal from saturated fats, less than '200mg'$  cholesterol, less than 1.5gm of sodium, & 5 or more servings of fruits and vegetables daily;Weight Loss: Understanding of general recommendations for a balanced deficit meal plan, which promotes 1-2 lb weight loss per week and includes a negative energy balance of [id number] kcal/d;Understanding recommendations for meals to include 15-35% energy as protein, 25-35% energy from fat, 35-60% energy from carbohydrates, less than '200mg'$  of dietary cholesterol, 20-35 gm of total fiber daily;Understanding of distribution of calorie intake throughout the day with the consumption of 4-5 meals/snacks    Improve shortness of breath with ADL's  Yes    Intervention  Provide education, individualized exercise plan and daily activity instruction to help decrease symptoms of SOB with activities of daily living.    Expected Outcomes  Short Term: Improve cardiorespiratory fitness to achieve a reduction of symptoms when performing ADLs;Long Term: Be able to perform more ADLs without symptoms or delay the onset of symptoms    Diabetes  Yes    Intervention  Provide education about signs/symptoms and action to take for hypo/hyperglycemia.;Provide education about proper nutrition, including hydration, and aerobic/resistive exercise prescription along with prescribed medications to achieve blood glucose in normal ranges: Fasting glucose 65-99 mg/dL    Expected Outcomes  Short Term: Participant verbalizes understanding of the signs/symptoms and immediate care of hyper/hypoglycemia, proper foot care and importance of medication, aerobic/resistive  exercise and nutrition plan for blood glucose control.;Long Term: Attainment of HbA1C < 7%.    Lipids  Yes    Intervention  Provide education and support for participant on nutrition & aerobic/resistive exercise along with prescribed medications to achieve LDL '70mg'$ , HDL >'40mg'$ .    Expected Outcomes  Short Term: Participant states understanding of desired cholesterol values and is compliant with medications prescribed. Participant is following exercise prescription and nutrition guidelines.;Long Term: Cholesterol controlled with medications as prescribed, with individualized exercise RX and with personalized nutrition plan. Value goals: LDL < '70mg'$ , HDL > 40 mg.       Core Components/Risk Factors/Patient Goals Review:  Goals and Risk Factor Review    Row Name 05/15/18 1515             Core Components/Risk Factors/Patient Goals Review   Personal Goals Review  Weight Management/Obesity;Improve shortness of breath with ADL's;Diabetes;Lipids       Review  Patient has been doing well in the program. Patient has been monitoring her blood sugar at home. Her shortness of breath is not where she wants it to be yet. She wants to lose weight to help her improve.        Expected Outcomes  Short: Attend [facility] regularly to improve shortness of breath with ADL's.  Long: maintain independence with ADL's           Core Components/Risk Factors/Patient Goals at Discharge (Final Review):  Goals and Risk Factor Review - 05/15/18 1515      Core Components/Risk Factors/Patient Goals Review   Personal Goals Review  Weight Management/Obesity;Improve shortness of breath with ADL's;Diabetes;Lipids    Review  Patient has been doing well in the program. Patient has been monitoring her blood sugar at home. Her shortness of breath is not where she wants it to be yet. She wants to lose weight to help her improve.     Expected Outcomes  Short: Attend [facility] regularly to improve shortness of breath with ADL's. Long:  maintain independence with ADL's        ITP Comments: ITP Comments    Row Name 04/08/18 1339 05/06/18 0817 06/03/18 0823       ITP Comments  Medical Evaluation completed. Chart sent for review and changes to Dr. Emily Filbert Director of [place]. Diagnosis can be found in [facility] encounter 01/31/18  30 day review completed. ITP sent to Dr. Emily Filbert Director of [place]. Continue with ITP unless changes are made by physician  30 day review completed. ITP sent to Dr. Emily Filbert Director of [place]. Continue with ITP unless changes are made by physician        Comments: 30 day review

## 2018-06-03 NOTE — Progress Notes (Signed)
Daily Session Note  Patient Details  Name: Susan Callahan MRN: [id number] Date of Birth: 08-16-1951 Referring Provider:     Pulmonary Rehab from 04/08/2018 in [facility] Cardiac and Pulmonary Rehab  Referring Provider  Jamesetta So MD      Encounter Date: 06/03/2018  Check In: Session Check In - 06/03/18 1034      Check-In   Supervising physician immediately available to respond to emergencies  [facility] immediately available ER MD    Physician(s)  Dr. Jimmye Norman and Dr. Corky Downs    Location  [facility]-Cardiac & Pulmonary Rehab    Staff Present  Nada Maclachlan, BA, ACSM CEP, Exercise Physiologist;Joseph [facility] [place], [place], ACSM CEP, Exercise Physiologist    Medication changes reported      No    Fall or balance concerns reported     No    Tobacco Cessation  No Change    Warm-up and Cool-down  Performed as group-led instruction    Resistance Training Performed  Yes    VAD Patient?  No    PAD/SET Patient?  No      Pain Assessment   Currently in Pain?  No/denies    Multiple Pain Sites  No          Social History   Tobacco Use  Smoking Status Former Smoker  . Packs/day: 3.00  . Years: 27.00  . Pack years: 81.00  . Types: Cigarettes  . Last attempt to quit: 03/15/1991  . Years since quitting: 27.2  Smokeless Tobacco Never Used    Goals Met:  Proper associated with RPD/PD & O2 Sat Independence with exercise equipment Exercise tolerated well No report of cardiac concerns or symptoms Strength training completed today  Goals Unmet:  Not Applicable  Comments: Pt able to follow exercise prescription today without complaint.  Will continue to monitor for progression.    Dr. Emily Filbert is Medical Director for [facility] and [facility]monary [facility].

## 2018-06-05 DIAGNOSIS — R0602 Shortness of breath: Secondary | ICD-10-CM

## 2018-06-05 NOTE — Progress Notes (Signed)
Daily Session Note  Patient Details  Name: Angelisse Riso MRN: [id number] Date of Birth: [birth year] Referring Provider:     Pulmonary Rehab from 04/08/2018 in [facility] Cardiac and Pulmonary Rehab  Referring Provider  Jamesetta So MD      Encounter Date: 06/05/2018  Check In: Session Check In - 06/05/18 1026      Check-In   Supervising physician immediately available to respond to emergencies  [facility] immediately available ER MD    Physician(s)  Jimmye Norman and Darl Householder    Location  [facility]-Cardiac & Pulmonary Rehab    Staff Present  Alberteen Sam, MA, RCEP, CCRP, Exercise Physiologist;Joseph Foy [place], [place], ACSM CEP, Exercise Physiologist    Medication changes reported      No    Fall or balance concerns reported     No    Warm-up and Cool-down  Performed as group-led instruction    Resistance Training Performed  Yes    VAD Patient?  No    PAD/SET Patient?  No      Pain Assessment   Currently in Pain?  No/denies    Multiple Pain Sites  No          Social History   Tobacco Use  Smoking Status Former Smoker  . Packs/day: 3.00  . Years: 27.00  . Pack years: 81.00  . Types: Cigarettes  . Last attempt to quit: 03/15/1991  . Years since quitting: 27.2  Smokeless Tobacco Never Used    Goals Met:  Proper associated with RPD/PD & O2 Sat Independence with exercise equipment Exercise tolerated well Strength training completed today  Goals Unmet:  Not Applicable  Comments: Pt able to follow exercise prescription today without complaint.  Will continue to monitor for progression.    Dr. Emily Filbert is Medical Director for [place] and [facility].

## 2018-06-07 DIAGNOSIS — R0602 Shortness of breath: Secondary | ICD-10-CM | POA: Diagnosis not present

## 2018-06-07 NOTE — Progress Notes (Signed)
Daily Session Note  Patient Details  Name: Susan Callahan MRN: [id number] Date of Birth: [birth year] Referring Provider:     Pulmonary Rehab from 04/08/2018 in [facility] Cardiac and Pulmonary Rehab  Referring Provider  Jamesetta So MD      Encounter Date: 06/07/2018  Check In: Session Check In - 06/07/18 1007      Check-In   Supervising physician immediately available to respond to emergencies  [facility] immediately available ER MD    Physician(s)  Dr. Corky Downs and Clearnce Hasten    Location  [facility]-Cardiac & Pulmonary Rehab    Staff Present  Justin Mend RCP,RRT,BSRT;Amanda Oletta Darter, BA, ACSM CEP, Exercise Physiologist;Meredith Sherryll Burger, RN BSN    Medication changes reported      No    Fall or balance concerns reported     No    Warm-up and Cool-down  Performed as group-led Higher education careers adviser Performed  Yes    VAD Patient?  No    PAD/SET Patient?  No      Pain Assessment   Currently in Pain?  No/denies          Social History   Tobacco Use  Smoking Status Former Smoker  . Packs/day: 3.00  . Years: 27.00  . Pack years: 81.00  . Types: Cigarettes  . Last attempt to quit: 03/15/1991  . Years since quitting: 27.2  Smokeless Tobacco Never Used    Goals Met:  Independence with exercise equipment Exercise tolerated well No report of cardiac concerns or symptoms Strength training completed today  Goals Unmet:  Not Applicable  Comments: Pt able to follow exercise prescription today without complaint.  Will continue to monitor for progression.    Dr. Emily Filbert is Medical Director for [place] and [facility].

## 2018-06-12 ENCOUNTER — Encounter: Payer: Medicare Other | Attending: Pulmonary Disease | Admitting: *Deleted

## 2018-06-12 DIAGNOSIS — Z87891 Personal history of nicotine dependence: Secondary | ICD-10-CM | POA: Insufficient documentation

## 2018-06-12 DIAGNOSIS — R0602 Shortness of breath: Secondary | ICD-10-CM

## 2018-06-12 NOTE — Progress Notes (Signed)
Daily Session Note  Patient Details  Name: Susan Callahan MRN: [id number] Date of Birth: Apr 13, 1951 Referring Provider:     Pulmonary Rehab from 04/08/2018 in [facility] Cardiac and Pulmonary Rehab  Referring Provider  Jamesetta So MD      Encounter Date: 06/12/2018  Check In: Session Check In - 06/12/18 0951      Check-In   Supervising physician immediately available to respond to emergencies  [facility] immediately available ER MD    Physician(s)  Drs. Joni Fears and [place]    Location  [facility]-Cardiac & Pulmonary Rehab    Staff Present  Alberteen Sam, MA, RCEP, CCRP, Exercise Physiologist;Joseph [employer], [place], ACSM CEP, Exercise Physiologist    Medication changes reported      No    Fall or balance concerns reported     No    Warm-up and Cool-down  Performed as group-led instruction    Resistance Training Performed  Yes    VAD Patient?  No    PAD/SET Patient?  No      Pain Assessment   Currently in Pain?  No/denies          Social History   Tobacco Use  Smoking Status Former Smoker  . Packs/day: 3.00  . Years: 27.00  . Pack years: 81.00  . Types: Cigarettes  . Last attempt to quit: 03/15/1991  . Years since quitting: 27.2  Smokeless Tobacco Never Used    Goals Met:  Proper associated with RPD/PD & O2 Sat Independence with exercise equipment Using PLB without cueing & demonstrates good technique Exercise tolerated well No report of cardiac concerns or symptoms Strength training completed today  Goals Unmet:  Not Applicable  Comments: Pt able to follow exercise prescription today without complaint.  Will continue to monitor for progression.    Dr. Emily Filbert is Medical Director for [name].

## 2018-06-17 ENCOUNTER — Encounter: Payer: Medicare Other | Admitting: *Deleted

## 2018-06-17 DIAGNOSIS — R0602 Shortness of breath: Secondary | ICD-10-CM | POA: Diagnosis not present

## 2018-06-17 NOTE — Progress Notes (Signed)
Daily Session Note  Patient Details  Name: Susan Callahan MRN: [id number] Date of Birth: [birth year] Referring Provider:     Pulmonary Rehab from 04/08/2018 in [facility] Cardiac and Pulmonary Rehab  Referring Provider  Jamesetta So MD      Encounter Date: 06/17/2018  Check In: Session Check In - 06/17/18 1016      Check-In   Supervising physician immediately available to respond to emergencies  [facility] immediately available ER MD    Physician(s)  Dr. Corky Downs and Dr. Quentin Cornwall    Location  [facility]-Cardiac & Pulmonary Rehab    Staff Present  Nada Maclachlan, BA, ACSM CEP, Exercise Physiologist;Joseph [facility] [place], [place], ACSM CEP, Exercise Physiologist    Medication changes reported      No    Fall or balance concerns reported     No    Tobacco Cessation  No Change    Warm-up and Cool-down  Performed as group-led instruction    Resistance Training Performed  Yes    VAD Patient?  No    PAD/SET Patient?  No      Pain Assessment   Currently in Pain?  No/denies    Multiple Pain Sites  No          Social History   Tobacco Use  Smoking Status Former Smoker  . Packs/day: 3.00  . Years: 27.00  . Pack years: 81.00  . Types: Cigarettes  . Last attempt to quit: 03/15/1991  . Years since quitting: 27.2  Smokeless Tobacco Never Used    Goals Met:  Proper associated with RPD/PD & O2 Sat Independence with exercise equipment Exercise tolerated well No report of cardiac concerns or symptoms Strength training completed today  Goals Unmet:  Not Applicable  Comments: Pt able to follow exercise prescription today without complaint.  Will continue to monitor for progression.    Dr. Emily Filbert is Medical Director for [place] and [facility].

## 2018-06-21 ENCOUNTER — Encounter: Payer: Medicare Other | Admitting: *Deleted

## 2018-06-21 DIAGNOSIS — R0602 Shortness of breath: Secondary | ICD-10-CM | POA: Diagnosis not present

## 2018-06-21 NOTE — Progress Notes (Signed)
Daily Session Note  Patient Details  Name: Susan Callahan MRN: [id number] Date of Birth: 01-23-1951 Referring Provider:     Pulmonary Rehab from 04/08/2018 in [facility] Cardiac and Pulmonary Rehab  Referring Provider  Jamesetta So MD      Encounter Date: 06/21/2018  Check In: Session Check In - 06/21/18 1024      Check-In   Supervising physician immediately available to respond to emergencies  [facility] immediately available ER MD    Physician(s)  Dr. Artis Flock and Clearnce Hasten    Location  [facility]-Cardiac & Pulmonary Rehab    Staff Present  Renita Papa, RN BSN;Joseph Hood RCP,RRT,BSRT;Amanda Oletta Darter, [place], ACSM CEP, Exercise Physiologist    Medication changes reported      No    Fall or balance concerns reported     No    Warm-up and Cool-down  Performed as group-led instruction    Resistance Training Performed  Yes    VAD Patient?  No    PAD/SET Patient?  No      Pain Assessment   Currently in Pain?  No/denies          Social History   Tobacco Use  Smoking Status Former Smoker  . Packs/day: 3.00  . Years: 27.00  . Pack years: 81.00  . Types: Cigarettes  . Last attempt to quit: 03/15/1991  . Years since quitting: 27.2  Smokeless Tobacco Never Used    Goals Met:  Proper associated with RPD/PD & O2 Sat Independence with exercise equipment Using PLB without cueing & demonstrates good technique Exercise tolerated well No report of cardiac concerns or symptoms Strength training completed today  Goals Unmet:  Not Applicable  Comments: Pt able to follow exercise prescription today without complaint.  Will continue to monitor for progression.    Dr. Emily Filbert is Medical Director for [place] and [facility].

## 2018-06-24 ENCOUNTER — Encounter: Payer: Medicare Other | Admitting: *Deleted

## 2018-06-24 DIAGNOSIS — R0602 Shortness of breath: Secondary | ICD-10-CM

## 2018-06-24 NOTE — Progress Notes (Signed)
Daily Session Note  Patient Details  Name: Ladelle Teodoro MRN: [id number] Date of Birth: 05-Jul-1951 Referring Provider:     Pulmonary Rehab from 04/08/2018 in [facility] Cardiac and Pulmonary Rehab  Referring Provider  Jamesetta So MD      Encounter Date: 06/24/2018  Check In: Session Check In - 06/24/18 1008      Check-In   Supervising physician immediately available to respond to emergencies  [facility] immediately available ER MD    Physician(s)  Dr. Charlynn Court and Dr. Kerman Passey    Location  [facility]-Cardiac & Pulmonary Rehab    Staff Present  Justin Mend Jaci Carrel, BS, ACSM CEP, Exercise Physiologist;Amanda Oletta Darter, [place], ACSM CEP, Exercise Physiologist    Medication changes reported      No    Fall or balance concerns reported     No    Tobacco Cessation  No Change    Warm-up and Cool-down  Performed as group-led instruction    Resistance Training Performed  Yes    VAD Patient?  No    PAD/SET Patient?  No      Pain Assessment   Currently in Pain?  No/denies    Multiple Pain Sites  No          Social History   Tobacco Use  Smoking Status Former Smoker  . Packs/day: 3.00  . Years: 27.00  . Pack years: 81.00  . Types: Cigarettes  . Last attempt to quit: 03/15/1991  . Years since quitting: 27.2  Smokeless Tobacco Never Used    Goals Met:  Proper associated with RPD/PD & O2 Sat Independence with exercise equipment Exercise tolerated well No report of cardiac concerns or symptoms Strength training completed today  Goals Unmet:  Not Applicable  Comments: Pt able to follow exercise prescription today without complaint.  Will continue to monitor for progression.    Dr. Emily Filbert is Medical Director for [place] and [facility].

## 2018-06-26 DIAGNOSIS — R0602 Shortness of breath: Secondary | ICD-10-CM

## 2018-06-26 NOTE — Progress Notes (Signed)
Daily Session Note  Patient Details  Name: Susan Callahan MRN: [id number] Date of Birth: 1951/04/26 Referring Provider:     Pulmonary Rehab from 04/08/2018 in [facility] Cardiac and Pulmonary Rehab  Referring Provider  Jamesetta So MD      Encounter Date: 06/26/2018  Check In: Session Check In - 06/26/18 1026      Check-In   Supervising physician immediately available to respond to emergencies  [facility] immediately available ER MD    Physician(s)  Burlene Arnt and Jimmye Norman    Location  [facility]-Cardiac & Pulmonary Rehab    Staff Present  Alberteen Sam, MA, RCEP, CCRP, Exercise Physiologist;Joseph Foy [place], [place], ACSM CEP, Exercise Physiologist    Medication changes reported      No    Fall or balance concerns reported     No    Warm-up and Cool-down  Performed as group-led instruction    Resistance Training Performed  Yes    VAD Patient?  No    PAD/SET Patient?  No      Pain Assessment   Currently in Pain?  No/denies    Multiple Pain Sites  No          Social History   Tobacco Use  Smoking Status Former Smoker  . Packs/day: 3.00  . Years: 27.00  . Pack years: 81.00  . Types: Cigarettes  . Last attempt to quit: 03/15/1991  . Years since quitting: 27.3  Smokeless Tobacco Never Used    Goals Met:  Proper associated with RPD/PD & O2 Sat Independence with exercise equipment Exercise tolerated well Strength training completed today  Goals Unmet:  Not Applicable  Comments: Pt able to follow exercise prescription today without complaint.  Will continue to monitor for progression.    Dr. Emily Filbert is Medical Director for [place] and [facility].

## 2018-07-01 ENCOUNTER — Encounter: Payer: Medicare Other | Admitting: *Deleted

## 2018-07-01 DIAGNOSIS — R0602 Shortness of breath: Secondary | ICD-10-CM

## 2018-07-01 NOTE — Progress Notes (Signed)
Daily Session Note  Patient Details  Name: Susan Callahan MRN: [id number] Date of Birth: [birth year] Referring Provider:     Pulmonary Rehab from 04/08/2018 in [facility] Cardiac and Pulmonary Rehab  Referring Provider  Jamesetta So MD      Encounter Date: 07/01/2018  Check In: Session Check In - 07/01/18 1016      Check-In   Supervising physician immediately available to respond to emergencies  [facility] immediately available ER MD    Physician(s)  Dr. Joni Fears and Dr. Corky Downs    Location  [facility]-Cardiac & Pulmonary Rehab    Staff Present  Earlean Shawl, BS, ACSM CEP, Exercise Physiologist;Amanda Oletta Darter, BA, ACSM CEP, Exercise Physiologist;Joseph Tessie Fass RCP,RRT,BSRT    Medication changes reported      No    Fall or balance concerns reported     No    Tobacco Cessation  No Change    Warm-up and Cool-down  Performed as group-led instruction    Resistance Training Performed  Yes    VAD Patient?  No    PAD/SET Patient?  No      Pain Assessment   Currently in Pain?  No/denies    Multiple Pain Sites  No          Social History   Tobacco Use  Smoking Status Former Smoker  . Packs/day: 3.00  . Years: 27.00  . Pack years: 81.00  . Types: Cigarettes  . Last attempt to quit: 03/15/1991  . Years since quitting: 27.3  Smokeless Tobacco Never Used    Goals Met:  Proper associated with RPD/PD & O2 Sat Independence with exercise equipment Exercise tolerated well No report of cardiac concerns or symptoms Strength training completed today  Goals Unmet:  Not Applicable  Comments: Pt able to follow exercise prescription today without complaint.  Will continue to monitor for progression.    Dr. Emily Filbert is Medical Director for [place] and [facility].

## 2018-07-01 NOTE — Progress Notes (Signed)
Pulmonary Individual Treatment Plan  Patient Details  Name: Susan Callahan MRN: [id number] Date of Birth: 11/11/1950 Referring Provider:     Pulmonary Rehab from 04/08/2018 in [facility]  Referring Provider  Jamesetta So MD      Initial Encounter Date:    Pulmonary Rehab from 04/08/2018 in [facility] Card[facility] Pulmonary Rehab  Date  04/08/18      Visit Diagnosis: Shortness of breath  Patient's Home Medications on Admission:  Current Outpatient Medications:  .  acetaZOLAMIDE (DIAMOX) 250 MG tablet, Take by mouth., Disp: , Rfl:  .  albuterol (PROVENTIL HFA;VENTOLIN HFA) 108 (90 Base) MCG/ACT inhaler, Inhale into the lungs., Disp: , Rfl:  .  allopurinol (ZYLOPRIM) 300 MG tablet, Take by mouth., Disp: , Rfl:  .  amitriptyline (ELAVIL) 10 MG tablet, Take by mouth., Disp: , Rfl:  .  aspirin EC 81 MG tablet, Take by mouth., Disp: , Rfl:  .  atorvastatin (LIPITOR) 20 MG tablet, Take by mouth., Disp: , Rfl:  .  b complex vitamins capsule, Take by mouth., Disp: , Rfl:  .  Calcium Carbonate-Vitamin D 600-400 MG-UNIT tablet, Take by mouth., Disp: , Rfl:  .  Coenzyme Q10 100 MG capsule, Take by mouth., Disp: , Rfl:  .  furosemide (LASIX) 40 MG tablet, Take by mouth., Disp: , Rfl:  .  gabapentin (NEURONTIN) 600 MG tablet, Take by mouth., Disp: , Rfl:  .  magnesium oxide (MAG-OX) 400 MG tablet, Take by mouth., Disp: , Rfl:  .  metFORMIN (GLUCOPHAGE-XR) 500 MG 24 hr tablet, Take by mouth., Disp: , Rfl:  .  Multiple Vitamin (MULTIVITAMIN) capsule, Take by mouth., Disp: , Rfl:  .  naproxen (EC NAPROSYN) 500 MG EC tablet, Take one pill bid for one weeks, then prn inflammation, Disp: , Rfl:  .  Omega-3 Fatty Acids ([employer] FISH OIL) 1200 MG CAPS, Take by mouth., Disp: , Rfl:  .  spironolactone (ALDACTONE) 100 MG tablet, Take 100 mg by mouth daily., Disp: , Rfl: 2 .  umeclidinium bromide (INCRUSE ELLIPTA) 62.5 MCG/INH AEPB, Inhale into the lungs., Disp: , Rfl:  .  verapamil  (CALAN-SR) 240 MG CR tablet, Take by mouth., Disp: , Rfl:   Past Medical History: Past Medical History:  Diagnosis Date  . Diabetes mellitus without complication ([place])   . Hypertension   . Migraine   . 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 Packs/day: 3.00  . Years: 27.00  . Pack years: 81.00  . Types: Cigarettes  . Last attempt to quit: 03/15/1991  . Years since quitting: 27.3  Smokeless Tobacco Never Used    Labs: Recent Review Flowsheet Data    There is no flowsheet data to display.       Pulmonary Assessment Scores: Pulmonary Assessment Scores    Row Name 04/08/18 1359         ADL UCSD   ADL Phase  Entry     SOB Score total  37     Rest  1     Walk  1     Stairs  4     Bath  0     Dress  0     Shop  2       CAT Score   CAT Score  19       mMRC Score   mMRC Score  1  Pulmonary Function Assessment: Pulmonary Function Assessment - 04/08/18 1402      Initial Spirometry Results   FVC%  99.5 %    FEV1%  80.5 %    FEV1/FVC Ratio  68    Comments  Test results from [facility] 04/03/18      Post Bronchodilator Spirometry Results   FVC%  99 %    FEV1%  85.5 %    FEV1/FVC Ratio  70    Comments  Test results from [facility] 04/03/18      Breath   Bilateral Breath Sounds  Clear    Shortness of Breath  Yes;Limiting activity       Exercise Target Goals: Exercise Program Goal: Individual exercise prescription set using results from initial 6 min walk test and THRR while considering  patient's activity barriers and safety.   Exercise Prescription Goal: Initial exercise prescription builds to 30-45 minutes a day of aerobic activity, 2-3 days per week.  Home exercise guidelines will be given to patient during program as part of exercise prescription that the participant will acknowledge.  Activity Barriers & Risk Stratification: Activity Barriers & Cardiac Risk  Stratification - 04/08/18 1432      Activity Barriers & Cardiac Risk Stratification   Activity Barriers  Balance Concerns;Muscular Weakness;Deconditioning;Shortness of Breath;Back Problems       6 Minute Walk: 6 Minute Walk    Row Name 04/08/18 1429         6 Minute Walk   Phase  Initial     Distance  1475 feet     Walk Time  6 minutes     # of Rest Breaks  0     MPH  2.79     METS  3.25     RPE  13     Perceived Dyspnea   3     VO2 Peak  11.39     Symptoms  Yes (comment)     Comments  SOB     Resting HR  68 bpm     Resting BP  144/72     Resting Oxygen Saturation   95 %     Exercise Oxygen Saturation  during 6 min walk  87 %     Max Ex. HR  112 bpm     Max Ex. BP  154/86     2 Minute Post BP  146/80       Interval HR   1 Minute HR  83     2 Minute HR  108     3 Minute HR  106     4 Minute HR  111     5 Minute HR  114     6 Minute HR  112     2 Minute Post HR  81     Interval Heart Rate?  Yes       Interval Oxygen   Interval Oxygen?  Yes     Baseline Oxygen Saturation %  95 %     1 Minute Oxygen Saturation %  91 %     1 Minute Liters of Oxygen  0 L Room Air     2 Minute Oxygen Saturation %  91 %     2 Minute Liters of Oxygen  0 L     3 Minute Oxygen Saturation %  87 %     3 Minute Liters of Oxygen  0 L     4 Minute Oxygen Saturation %  93 %     4 Minute Liters of Oxygen  0 L     5 Minute Oxygen Saturation %  93 %     5 Minute Liters of Oxygen  0 L     6 Minute Oxygen Saturation %  94 %     6 Minute Liters of Oxygen  0 L     2 Minute Post Oxygen Saturation %  96 %     2 Minute Post Liters of Oxygen  0 L       Oxygen Initial Assessment: Oxygen Initial Assessment - 04/08/18 1430      Home Oxygen   Home Oxygen Device  None    Sleep Oxygen Prescription  None    Home Exercise Oxygen Prescription  None    Home at Rest Exercise Oxygen Prescription  None      Initial 6 min Walk   Oxygen Used  None      Program Oxygen Prescription   Program Oxygen  Prescription  None      Intervention   Short Term Goals  To learn and understand importance of maintaining oxygen saturations>88%;To learn and demonstrate proper pursed lip breathing techniques or other breathing techniques.;To learn and demonstrate proper use of respiratory medications;To learn and understand importance of monitoring SPO2 with pulse oximeter and demonstrate accurate use of the pulse oximeter.   Her inhaler was 300 dollars and cannot afford her inhaler.   Long  Term Goals  Verbalizes importance of monitoring SPO2 with pulse oximeter and return demonstration;Maintenance of O2 saturations>88%;Exhibits proper breathing techniques, such as pursed lip breathing or other method taught during program session;Demonstrates proper use of MDI's;Compliance with respiratory medication       Oxygen Re-Evaluation: Oxygen Re-Evaluation    Row Name 04/15/18 1039 05/15/18 1459 06/05/18 1540         Program Oxygen Prescription   Program Oxygen Prescription  None  None  None       Home Oxygen   Home Oxygen Device  None  None  None     Sleep Oxygen Prescription  None  None  None     Home Exercise Oxygen Prescription  None  None  None     Home at Rest Exercise Oxygen Prescription  None  None  None       Goals/Expected Outcomes   Short Term Goals  To learn and understand importance of maintaining oxygen saturations>88%;To learn and demonstrate proper pursed lip breathing techniques or other breathing techniques.;To learn and demonstrate proper use of respiratory medications;To learn and understand importance of monitoring SPO2 with pulse oximeter and demonstrate accurate use of the pulse oximeter.  To learn and understand importance of maintaining oxygen saturations>88%;To learn and demonstrate proper pursed lip breathing techniques or other breathing techniques.;To learn and demonstrate proper use of respiratory medications;To learn and understand importance of monitoring SPO2 with pulse  oximeter and demonstrate accurate use of the pulse oximeter.  To learn and understand importance of maintaining oxygen saturations>88%;To learn and demonstrate proper pursed lip breathing techniques or other breathing techniques.;To learn and demonstrate proper use of respiratory medications;To learn and understand importance of monitoring SPO2 with pulse oximeter and demonstrate accurate use of the pulse oximeter.     Long  Term Goals  Verbalizes importance of monitoring SPO2 with pulse oximeter and return demonstration;Maintenance of O2 saturations>88%;Exhibits proper breathing techniques, such as pursed lip breathing or other method taught during program session;Demonstrates proper use of MDI's;Compliance with respiratory medication  Verbalizes  importance of monitoring SPO2 with pulse oximeter and return demonstration;Maintenance of O2 saturations>88%;Exhibits proper breathing techniques, such as pursed lip breathing or other method taught during program session;Demonstrates proper use of MDI's;Compliance with respiratory medication  Verbalizes importance of monitoring SPO2 with pulse oximeter and return demonstration;Maintenance of O2 saturations>88%;Exhibits proper breathing techniques, such as pursed lip breathing or other method taught during program session;Demonstrates proper use of MDI's;Compliance with respiratory medication     Comments  Reviewed PLB technique with pt.  Talked about how it work and it's important to maintaining his exercise saturations.    Patient states that she does not have a pulse oximeter at home. She also need to work on PLB more when she gets short of breath. She states she forgets to do PLB when she is at home.  Jamiah has been doing well in the program and her oxygen seems to be in the low to mid 90's. She uses PLB at home when she is carrying something. She does not have a pulse oximeter at home to check her HR and oxygen. Informed her on where to get one and to check when  she is exerting herself at home.     Goals/Expected Outcomes  Short: Become more profiecient at using PLB.   Long: Become independent at using PLB.  Short: monitor oxygen at home with exertion when she has a pulse oximeter. Long: maintain oxygen saturations above 88 percent independently.  Short: monitor oxygen at home with exertion. Long: maintain oxygen saturations above 88 percent independently.        Oxygen Discharge (Final Oxygen Re-Evaluation): Oxygen Re-Evaluation - 06/05/18 1540      Program Oxygen Prescription   Program Oxygen Prescription  None      Home Oxygen   Home Oxygen Device  None    Sleep Oxygen Prescription  None    Home Exercise Oxygen Prescription  None    Home at Rest Exercise Oxygen Prescription  None      Goals/Expected Outcomes   Short Term Goals  To learn and understand importance of maintaining oxygen saturations>88%;To learn and demonstrate proper pursed lip breathing techniques or other breathing techniques.;To learn and demonstrate proper use of respiratory medications;To learn and understand importance of monitoring SPO2 with pulse oximeter and demonstrate accurate use of the pulse oximeter.    Long  Term Goals  Verbalizes importance of monitoring SPO2 with pulse oximeter and return demonstration;Maintenance of O2 saturations>88%;Exhibits proper breathing techniques, such as pursed lip breathing or other method taught during program session;Demonstrates proper use of MDI's;Compliance with respiratory medication    Comments  Leylanie has been doing well in the program and her oxygen seems to be in the low to mid 90's. She uses PLB at home when she is carrying something. She does not have a pulse oximeter at home to check her HR and oxygen. Informed her on where to get one and to check when she is exerting herself at home.    Goals/Expected Outcomes  Short: monitor oxygen at home with exertion. Long: maintain oxygen saturations above 88 percent independently.        Initial Exercise Prescription: Initial Exercise Prescription - 04/08/18 1400      Date of Initial Exercise RX and Referring Provider   Date  04/08/18    Referring Provider  Jamesetta So MD      Treadmill   MPH  2.5    Grade  1    Minutes  15    METs  3.26  Recumbant Bike   Level  2    RPM  50    Watts  38    Minutes  15    METs  3.2      NuStep   Level  3    SPM  80    Minutes  15    METs  3      Prescription Details   Frequency (times per week)  3    Duration  Progress to 45 minutes of aerobic exercise without signs/symptoms of physical distress      Intensity   THRR 40-80% of Max Heartrate  102-136    Ratings of Perceived Exertion  11-13    Perceived Dyspnea  0-4      Progression   Progression  Continue to progress workloads to maintain intensity without signs/symptoms of physical distress.      Resistance Training   Training Prescription  Yes    Weight  3 lbs    Reps  10-15       Perform Capillary Blood Glucose checks as needed.  Exercise Prescription Changes: Exercise Prescription Changes    Row Name 04/08/18 1400 04/24/18 1200 05/08/18 1200 05/23/18 0800 06/05/18 1200     Response to Exercise   Blood Pressure (Admit)  144/72  114/68  128/63  126/72  146/60   Blood Pressure (Exercise)  154/86  -  -  -  -   Blood Pressure (Exit)  146/80  120/72  130/64  124/64  122/70   Heart Rate (Admit)  68 bpm  108 bpm  78 bpm  76 bpm  80 bpm   Heart Rate (Exercise)  114 bpm  76 bpm  88 bpm  98 bpm  103 bpm   Heart Rate (Exit)  81 bpm  69 bpm  67 bpm  78 bpm  76 bpm   Oxygen Saturation (Admit)  95 %  92 %  93 %  93 %  89 %   Oxygen Saturation (Exercise)  87 %  95 %  94 %  95 %  91 %   Oxygen Saturation (Exit)  96 %  92 %  94 %  94 %  90 %   Rating of Perceived Exertion (Exercise)  '13  13  13  13  12   '$ Perceived Dyspnea (Exercise)  '3  2  2  2  1   '$ Symptoms  SOB  -  -  -  -   Comments  walk test results  -  -  -  -   Duration  -  Continue with 45 min of  aerobic exercise without signs/symptoms of physical distress.  Continue with 45 min of aerobic exercise without signs/symptoms of physical distress.  Continue with 45 min of aerobic exercise without signs/symptoms of physical distress.  Continue with 45 min of aerobic exercise without signs/symptoms of physical distress.   Intensity  -  THRR unchanged  THRR unchanged  THRR unchanged  THRR unchanged     Progression   Progression  -  Continue to progress workloads to maintain intensity without signs/symptoms of physical distress.  Continue to progress workloads to maintain intensity without signs/symptoms of physical distress.  Continue to progress workloads to maintain intensity without signs/symptoms of physical distress.  Continue to progress workloads to maintain intensity without signs/symptoms of physical distress.   Average METs  -  2.63  2.8  2.98  3.25     Resistance Training   Training Prescription  -  Yes  Yes  Yes  Yes   Weight  -  3 lb  4 lb  4 lb  4 lb   Reps  -  10-15  10-15  10-15  10-15     Interval Training   Interval Training  -  No  No  No  No     Treadmill   MPH  -  2.5  2.5  2.5  2.5   Grade  -  '1  1  1  1   '$ Minutes  -  '15  15  15  15   '$ METs  -  3.26  3.26  3.26  3.26     Recumbant Bike   Level  -  -  -  2  2   RPM  -  -  -  60  60   Watts  -  -  -  23  29   Minutes  -  -  -  15  15   METs  -  -  -  2.7  3.25     NuStep   Level  -  3  5  -  -   SPM  -  80  80  -  -   Minutes  -  15  15  -  -   METs  -  2  2.2  -  -     Home Exercise Plan   Plans to continue exercise at  -  [place]   Frequency  -  Add 1 additional day to program exercise sessions.  Add 1 additional day to program exercise sessions.  Add 1 additional day to program exercise sessions.  Add 1 additional day to program exercise sessions.   Initial Home Exercises Provided  -  04/24/18  04/24/18  04/24/18  04/24/18   Row Name 06/19/18 1100              Response to Exercise   Blood Pressure (Admit)  148/84       Blood Pressure (Exit)  128/66       Heart Rate (Admit)  80 bpm       Heart Rate (Exercise)  96 bpm       Heart Rate (Exit)  80 bpm       Oxygen Saturation (Admit)  93 %       Oxygen Saturation (Exercise)  92 %       Oxygen Saturation (Exit)  93 %       Rating of Perceived Exertion (Exercise)  12       Perceived Dyspnea (Exercise)  1       Duration  Continue with 45 min of aerobic exercise without signs/symptoms of physical distress.       Intensity  Other (comment) reached RPE goal - HR stays low         Progression   Progression  Continue to progress workloads to maintain intensity without signs/symptoms of physical distress.       Average METs  3.33         Resistance Training   Training Prescription  Yes       Weight  4 lb       Reps  10-15         Interval Training   Interval Training  No         Recumbant Bike   Level  2       RPM  60       Watts  33       Minutes  15       METs  3.25         NuStep   Level  6       SPM  80       Minutes  15       METs  3.5         Home Exercise Plan   Plans to continue exercise at  [employer]       Frequency  Add 1 additional day to program exercise sessions.       Initial Home Exercises Provided  04/24/18          Exercise Comments: Exercise Comments    Row Name 04/15/18 1023 04/24/18 1143         Exercise Comments   First full day of exercise!  Patient was oriented to gym and equipment including functions, settings, policies, and procedures.  Patient's individual exercise prescription and treatment plan were reviewed.  All starting workloads were established based on the results of the 6 minute walk test done at initial orientation visit.  The plan for exercise progression was also introduced and progression will be customized based on patient's performance and goals.  Reviewed home exercise with pt today.  Pt plans to walk and consider [place] for exercise.   Reviewed THR, pulse, RPE, sign and symptoms, NTG use, and when to call 911 or MD.  Also discussed weather considerations and indoor options.  Pt voiced understanding.         Exercise Goals and Review: Exercise Goals    Row Name 04/08/18 1435             Exercise Goals   Increase Physical Activity  Yes       Intervention  Provide advice, education, support and counseling about physical activity/exercise needs.;Develop an individualized exercise prescription for aerobic and resistive training based on initial evaluation findings, risk stratification, comorbidities and participant's personal goals.       Expected Outcomes  Short Term: Attend rehab on a regular basis to increase amount of physical activity.;Long Term: Add in home exercise to make exercise part of routine and to increase amount of physical activity.;Long Term: Exercising regularly at least 3-5 days a week.       Increase Strength and Stamina  Yes       Intervention  Provide advice, education, support and counseling about physical activity/exercise needs.;Develop an individualized exercise prescription for aerobic and resistive training based on initial evaluation findings, risk stratification, comorbidities and participant's personal goals.       Expected Outcomes  Short Term: Increase workloads from initial exercise prescription for resistance, speed, and METs.;Short Term: Perform resistance training exercises routinely during rehab and add in resistance training at home;Long Term: Improve cardiorespiratory fitness, muscular endurance and strength as measured by increased METs and functional capacity (6MWT)       Able to understand and use rate of perceived exertion (RPE) scale  Yes       Intervention  Provide education and explanation on how to use RPE scale       Expected Outcomes  Short Term: Able to use RPE daily in rehab to express subjective intensity level;Long Term:  Able to use RPE to guide intensity level when exercising  independently       Able to understand and use  Dyspnea scale  Yes       Intervention  Provide education and explanation on how to use Dyspnea scale       Expected Outcomes  Short Term: Able to use Dyspnea scale daily in rehab to express subjective sense of shortness of breath during exertion;Long Term: Able to use Dyspnea scale to guide intensity level when exercising independently       Knowledge and understanding of Target Heart Rate Range (THRR)  Yes       Intervention  Provide education and explanation of THRR including how the numbers were predicted and where they are located for reference       Expected Outcomes  Short Term: Able to state/look up THRR;Short Term: Able to use daily as guideline for intensity in rehab;Long Term: Able to use THRR to govern intensity when exercising independently       Able to check pulse independently  Yes       Intervention  Provide education and demonstration on how to check pulse in carotid and radial arteries.;Review the importance of being able to check your own pulse for safety during independent exercise       Expected Outcomes  Short Term: Able to explain why pulse checking is important during independent exercise;Long Term: Able to check pulse independently and accurately       Understanding of Exercise Prescription  Yes       Intervention  Provide education, explanation, and written materials on patient's individual exercise prescription       Expected Outcomes  Short Term: Able to explain program exercise prescription;Long Term: Able to explain home exercise prescription to exercise independently          Exercise Goals Re-Evaluation : Exercise Goals Re-Evaluation    Row Name 04/15/18 1023 04/24/18 1143 04/24/18 1248 05/08/18 1220 05/23/18 0812     Exercise Goal Re-Evaluation   Exercise Goals Review  Increase Physical Activity;Increase Strength and Stamina;Able to understand and use rate of perceived exertion (RPE) scale;Knowledge and understanding  of Target Heart Rate Range (THRR);Able to understand and use Dyspnea scale;Understanding of Exercise Prescription  Increase Physical Activity;Increase Strength and Stamina;Able to understand and use rate of perceived exertion (RPE) scale;Able to understand and use Dyspnea scale;Knowledge and understanding of Target Heart Rate Range (THRR);Able to check pulse independently  Increase Physical Activity;Increase Strength and Stamina;Able to understand and use rate of perceived exertion (RPE) scale;Able to understand and use Dyspnea scale;Knowledge and understanding of Target Heart Rate Range (THRR)  Increase Physical Activity;Increase Strength and Stamina;Able to understand and use rate of perceived exertion (RPE) scale;Able to understand and use Dyspnea scale  Increase Physical Activity;Increase Strength and Stamina;Able to understand and use rate of perceived exertion (RPE) scale;Able to understand and use Dyspnea scale   Comments  Reviewed RPE scale, THR and program prescription with pt today.  Pt voiced understanding and was given a copy of goals to take home.   Reviewed home exercise with pt today.  Pt plans to walk and consider [place] for exercise.  Reviewed THR, pulse, RPE, sign and symptoms, NTG use, and when to call 911 or MD.  Also discussed weather considerations and indoor options.  Pt voiced understanding.  -  Lona has increased level on NS and overall MET level.  She has increased to 4 lb for strength work.  Staff will monitor progress.  Jacquette is meeting RPE goals and has increased MET level slightly.  Staff will encourage increasing level on bike.  Expected Outcomes  Short: Use RPE daily to regulate intensity. Long: Follow program prescription in THR.  Short - add one day of walking in addition to program sessions and research options like FF Long - maintain exercise on her own  -  Short - continue to attend consistently Long - improve MET level  Short - attend consistently Long - improve MET  level   Row Name 06/05/18 1243 06/19/18 1157           Exercise Goal Re-Evaluation   Exercise Goals Review  Increase Physical Activity;Increase Strength and Stamina;Able to understand and use rate of perceived exertion (RPE) scale;Able to understand and use Dyspnea scale  Increase Physical Activity;Increase Strength and Stamina;Able to understand and use rate of perceived exertion (RPE) scale;Able to understand and use Dyspnea scale;Understanding of Exercise Prescription      Comments  Saffron has increased overall MET level.  She plans to continue exercise in [employer].  Emilygrace continues to increase overall MET level.  She tolerates exercise well.      Expected Outcomes  Short - continue to progress workloads as tolerated Long - increase overall MET level  Short - progress levels on bike and TM Long - increase overall METs         Discharge Exercise Prescription (Final Exercise Prescription Changes): Exercise Prescription Changes - 06/19/18 1100      Response to Exercise   Blood Pressure (Admit)  148/84    Blood Pressure (Exit)  128/66    Heart Rate (Admit)  80 bpm    Heart Rate (Exercise)  96 bpm    Heart Rate (Exit)  80 bpm    Oxygen Saturation (Admit)  93 %    Oxygen Saturation (Exercise)  92 %    Oxygen Saturation (Exit)  93 %    Rating of Perceived Exertion (Exercise)  12    Perceived Dyspnea (Exercise)  1    Duration  Continue with 45 min of aerobic exercise without signs/symptoms of physical distress.    Intensity  Other (comment)   reached RPE goal - HR stays low     Progression   Progression  Continue to progress workloads to maintain intensity without signs/symptoms of physical distress.    Average METs  3.33      Resistance Training   Training Prescription  Yes    Weight  4 lb    Reps  10-15      Interval Training   Interval Training  No      Recumbant Bike   Level  2    RPM  60    Watts  33    Minutes  15    METs  3.25      NuStep   Level  6    SPM  80     Minutes  15    METs  3.5      Home Exercise Plan   Plans to continue exercise at  [employer]    Frequency  Add 1 additional day to program exercise sessions.    Initial Home Exercises Provided  04/24/18       Nutrition:  Target Goals: Understanding of nutrition guidelines, daily intake of sodium '1500mg'$ , cholesterol '200mg'$ , calories 30% from fat and 7% or less from saturated fats, daily to have 5 or more servings of fruits and vegetables.  Biometrics: Pre Biometrics - 04/08/18 1435      Pre Biometrics   Height  5' 5.1" (1.654 m)  Weight  210 lb 12.8 oz (95.6 kg)    Waist Circumference  43 inches    Hip Circumference  44 inches    Waist to Hip Ratio  0.98 %    BMI (Calculated)  34.95    Single Leg Stand  0.72 seconds        Nutrition Therapy Plan and Nutrition Goals: Nutrition Therapy & Goals - 04/17/18 1414      Nutrition Therapy   Diet  TLC    Drug/Food Interactions  Statins/Certain Fruits   reports to consume grapefruit with cottage cheese occasionally   Protein (specify units)  10oz    Fiber  25 grams    Whole Grain Foods  2 servings   3 ideal, not regularly consuming whole grain options   Saturated Fats  12 max. grams    Fruits and Vegetables  5 servings/day   8 ideal. eats 2 meals/day   Sodium  2000 grams      Personal Nutrition Goals   Nutrition Goal  When choosing frozen meals, choose options with no more than '600mg'$  of sodium per meal    Personal Goal #2  Opt for frozen over canned vegetables when possible    Personal Goal #3  Eat breakfast on a more consistent schedule, particularly on the days when you are planning to exercise    Comments  She is on a tight budget but is seeking to eat healthier. Does not follow a diabetic diet and consumes a diet high in frozen / pre-prepared foods       Intervention Plan   Intervention  Prescribe, educate and counsel regarding individualized specific dietary modifications aiming towards targeted core components such  as weight, hypertension, lipid management, diabetes, heart failure and other comorbidities.;Nutrition handout(s) given to patient.   Spice it up salt-free seasoning recipe sheet, Low sodium nutrition therapy   Expected Outcomes  Short Term Goal: Understand basic principles of dietary content, such as calories, fat, sodium, cholesterol and nutrients.;Short Term Goal: A plan has been developed with personal nutrition goals set during dietitian appointment.       Nutrition Assessments: Nutrition Assessments - 04/08/18 1359      MEDFICTS Scores   Pre Score  28       Nutrition Goals Re-Evaluation: Nutrition Goals Re-Evaluation    [place] Name 04/17/18 1437 05/15/18 1508 06/12/18 1144         Goals   Current Weight  -  212 lb (96.2 kg)  -     Nutrition Goal  When choosing frozen meals, choose options with no more than '600mg'$  of sodium per meal  Eat healthier and lose weight  When buying frozen meals, look for varieties that have less than '600mg'$  of sodium per meal; opt for frozen over canned vegetables when possible; eat breakfast on a more consistent schedule; eat breakfast on a more consistent schedule     Comment  She consumes frozen meals regularly and does not always pay attention to sodium content  Patient is trying to eat a heart healthy diet. She has lost a few pounds and trying to keep the junk food out of the house  She has not been buying frozen meals as of recently and she is trying to find healthier options overall, but is stuggling to know what to choose without a "list" or "set plan." She is eating breakfast but not on a daily basis, sometimes has more of a "brunch." She would like written ideas for foods to choose  and prepare Gave DASH diet menu options which includes meal and snack ideas     Expected Outcome  She will choose frozen meals with less than '600mg'$  of sodium, ideally with at least 15g protein and greater than 4g fiber   Short: lose more weight. Long: Maintain weight loss by  dieting independently  She will continue to work on eating breakfast on a more regular basis and will continue to reduce her frozen meal intake. She will try frozen vegetables as opposed to canned when able and will use the lists provided to give herself an idea of what foods to choose on a daily basis, ideally opting for a diet that is mostly whole-food based       Personal Goal #2 Re-Evaluation   Personal Goal #2  Opt for frozen over canned vegetables when possible  -  -       Personal Goal #3 Re-Evaluation   Personal Goal #3  Eat breakfast on a more consistent schedule, particularly on the days when you are planning to exercise  -  -        Nutrition Goals Discharge (Final Nutrition Goals Re-Evaluation): Nutrition Goals Re-Evaluation - 06/12/18 1144      Goals   Nutrition Goal  When buying frozen meals, look for varieties that have less than '600mg'$  of sodium per meal; opt for frozen over canned vegetables when possible; eat breakfast on a more consistent schedule; eat breakfast on a more consistent schedule    Comment  She has not been buying frozen meals as of recently and she is trying to find healthier options overall, but is stuggling to know what to choose without a "list" or "set plan." She is eating breakfast but not on a daily basis, sometimes has more of a "brunch." She would like written ideas for foods to choose and prepare   Gave DASH diet menu options which includes meal and snack ideas   Expected Outcome  She will continue to work on eating breakfast on a more regular basis and will continue to reduce her frozen meal intake. She will try frozen vegetables as opposed to canned when able and will use the lists provided to give herself an idea of what foods to choose on a daily basis, ideally opting for a diet that is mostly whole-food based       Psychosocial: Target Goals: Acknowledge presence or absence of significant depression and/or stress, maximize coping skills, provide  positive support system. Participant is able to verbalize types and ability to use techniques and skills needed for reducing stress and depression.   Initial Review & Psychosocial Screening: Initial Psych Review & Screening - 04/08/18 1422      Initial Review   Current issues with  Current Depression;History of Depression;Current Stress Concerns    Source of Stress Concerns  Financial;Retirement/disability;Unable to perform yard/household activities    Comments  She has not planed to be this disabled when she is retired. She is unable to do yardwork and do the things she used to do.      Family Dynamics   Good Support System?  No    Concerns  No support system    Comments  She states her sister is sort of there but has health issues and things she is dealing with.      Barriers   Psychosocial barriers to participate in program  The patient should benefit from training in stress management and relaxation.      Screening  Interventions   Interventions  Encouraged to exercise;Program counselor consult;To provide support and resources with identified psychosocial needs;Provide feedback about the scores to participant    Expected Outcomes  Short Term goal: Utilizing psychosocial counselor, staff and physician to assist with identification of specific Stressors or current issues interfering with healing process. Setting desired goal for each stressor or current issue identified.;Long Term Goal: Stressors or current issues are controlled or eliminated.;Short Term goal: Identification and review with participant of any Quality of Life or Depression concerns found by scoring the questionnaire.;Long Term goal: The participant improves quality of Life and PHQ9 Scores as seen by post scores and/or verbalization of changes       Quality of Life Scores:  Scores of 19 and below usually indicate a poorer quality of life in these areas.  A difference of  2-3 points is a clinically meaningful difference.  A  difference of 2-3 points in the total score of the Quality of Life Index has been associated with significant improvement in overall quality of life, self-image, physical symptoms, and general health in studies assessing change in quality of life.  PHQ-9: Recent Review Flowsheet Data    Depression screen [facility] 2/9 04/08/2018   Decreased Interest 1   Down, Depressed, Hopeless 1   PHQ - 2 Score 2   Altered sleeping 0   Tired, decreased energy 2   Change in appetite 1   Feeling bad or failure about yourself  1   Trouble concentrating 0   Moving slowly or fidgety/restless 0   Suicidal thoughts 0   PHQ-9 Score 6   Difficult doing work/chores Somewhat difficult     Interpretation of Total Score  Total Score Depression Severity:  1-4 = Minimal depression, 5-9 = Mild depression, 10-14 = Moderate depression, 15-19 = Moderately severe depression, 20-27 = Severe depression   Psychosocial Evaluation and Intervention: Psychosocial Evaluation - 04/15/18 1042      Psychosocial Evaluation & Interventions   Interventions  Encouraged to exercise with the program and follow exercise prescription;Stress management education    Comments  Counselor met with Ms. Hendricks Milo) today for initial psychosocial evaluation.  She is a 67 year old who has COPD.  Foster has a limited support system as she lives alone and has a sister in [place].  She reports sleeping well and having an "okay" appetite - wherein she has begun losing weight recently - which she states is a "good thing."  Dollie has a long history of depression and is on medications that help with this.  She reports her mood is stable and she has multiple stressors with her health and finances and a limited support system.  Her goals for this program are to be able to return to more normal activities without SOB; to walk more easily; to increase her energy and stamina and to lose more weight.  Staff will follow with Vaughan Basta.    Expected Outcomes  Short:  Ayanna will  meet with the dietician to address her weight loss goals.  She will also exercise more consistently to improve her energy; stamina and mood.   Long:  Zeriah will develop a routine of positive self-care for her health and mental health.    Continue Psychosocial Services   Follow up required by staff       Psychosocial Re-Evaluation: Psychosocial Re-Evaluation    [place] Name 04/22/18 1123 05/15/18 1503 06/17/18 1117         Psychosocial Re-Evaluation   Current issues with  Current Stress Concerns  Current Stress Concerns;Current Depression  Current Stress Concerns     Comments  Counselor followed up with Vaughan Basta today reporting she is frustrated with the staff in that she cannot remember how to work the machines (with her cognitive issues) and she hates to keep asking them to help.  Mischa states she is beginning to take notes on how to use the machines in this class for her benefit in the future.  Counselor commended her positive self-care and being proactive re: her needs.    Patient states that sometimes she does feel depressed. She had shingles in her ear and it affected her cognitive abilities. She does not have any family here to rely on and she lives alone. It is hard for her to do things becuase she is on a fixed income.  Counselor follow with Ariyon today reporting she is experiencing some progress in her energy level; stamina and flexibility since coming into this program.  She is continuing to experience a great deal of back pain as a result of some of the equipment - per her report - and mentioned the need for an injection.  Emberli states she has no supports here locally to accompany her to get this injection at [facility] to alleviate some of the pain problems in her back.  Counselor mentioned to staff and asked Wilmarie to call 211 - to see about local resources to help her with this need.  She is on fixed income and cannot afford to pay someone to do this for her.  Counselor and staff will follow with Galadriel  on this.  Counselor commended her on her commitment to this program and on her progress made so far.     Expected Outcomes  Short:  Ayodele will develop independent skills in her self-care by making notes on the machines she uses and how to operate.   Long:  Kasidi will continue to exercise consistently to achieve her stated goals of weight loss and increased energy/stamina and improved mood.   Short: Attend [facility] stress management education to decrease stress. Long: Maintain exercise Post [facility] to keep stress at a minimum.  Short:  Anadelia will research transportation options for her medical needs re: her chronic back pain.  Long:  Evian will continue to exercise to maintain progress made of increased stamina and strength.       Interventions  -  Encouraged to attend Pulmonary Rehabilitation for the exercise  -     Continue Psychosocial Services   Follow up required by staff  Follow up required by counselor  -        Psychosocial Discharge (Final Psychosocial Re-Evaluation): Psychosocial Re-Evaluation - 06/17/18 1117      Psychosocial Re-Evaluation   Current issues with  Current Stress Concerns    Comments  Counselor follow with Vaughan Basta today reporting she is experiencing some progress in her energy level; stamina and flexibility since coming into this program.  She is continuing to experience a great deal of back pain as a result of some of the equipment - per her report - and mentioned the need for an injection.  Kynzie states she has no supports here locally to accompany her to get this injection at [facility] to alleviate some of the pain problems in her back.  Counselor mentioned to staff and asked Aloni to call 211 - to see about local resources to help her with this need.  She is on fixed income and cannot afford to pay  someone to do this for her.  Counselor and staff will follow with Maliyah on this.  Counselor commended her on her commitment to this program and on her progress made so far.    Expected  Outcomes  Short:  Micheal will research transportation options for her medical needs re: her chronic back pain.  Long:  Kataleyah will continue to exercise to maintain progress made of increased stamina and strength.         Education: Education Goals: Education classes will be provided on a weekly basis, covering required topics. Participant will state understanding/return demonstration of topics presented.  Learning Barriers/Preferences: Learning Barriers/Preferences - 04/08/18 1428      Learning Barriers/Preferences   Learning Barriers  None    Learning Preferences  None       Education Topics:  Initial Evaluation Education: - Verbal, written and demonstration of respiratory meds, oximetry and breathing techniques. Instruction on use of nebulizers and MDIs and importance of monitoring MDI activations.   Pulmonary Rehab from 06/26/2018 in [facility] Cardiac and Pulmonary Rehab  Date  04/08/18  Educator  [facility]  Instruction Review Code  1- Verbalizes Understanding      General Nutrition Guidelines/Fats and Fiber: -Group instruction provided by verbal, written material, models and posters to present the general guidelines for heart healthy nutrition. Gives an explanation and review of dietary fats and fiber.   Pulmonary Rehab from 06/26/2018 in [facility] Cardiac and Pulmonary Rehab  Date  05/22/18  Educator  [facility]  Instruction Review Code  1- Verbalizes Understanding      Controlling Sodium/Reading Food Labels: -Group verbal and written material supporting the discussion of sodium use in heart healthy nutrition. Review and explanation with models, verbal and written materials for utilization of the food label.   Pulmonary Rehab from 06/26/2018 in [facility] Cardiac and Pulmonary Rehab  Date  05/29/18  Educator  [facility]  Instruction Review Code  1- Verbalizes Understanding      Exercise Physiology & General Exercise Guidelines: - Group verbal and written instruction with models to review the exercise  physiology of the cardiovascular system and associated critical values. Provides general exercise guidelines with specific guidelines to those with heart or lung disease.    Pulmonary Rehab from 06/26/2018 in [facility] Cardiac and Pulmonary Rehab  Date  06/05/18  Educator  [facility]  Instruction Review Code  1- Verbalizes Understanding      Aerobic Exercise & Resistance Training: - Gives group verbal and written instruction on the various components of exercise. Focuses on aerobic and resistive training programs and the benefits of this training and how to safely progress through these programs.   Flexibility, Balance, Mind/Body Relaxation: Provides group verbal/written instruction on the benefits of flexibility and balance training, including mind/body exercise modes such as yoga, pilates and tai chi.  Demonstration and skill practice provided.   Stress and Anxiety: - Provides group verbal and written instruction about the health risks of elevated stress and causes of high stress.  Discuss the correlation between heart/lung disease and anxiety and treatment options. Review healthy ways to manage with stress and anxiety.   Pulmonary Rehab from 06/26/2018 in [facility] Cardiac and Pulmonary Rehab  Date  06/07/18  Educator  [facility]  Instruction Review Code  1- Verbalizes Understanding      Depression: - Provides group verbal and written instruction on the correlation between heart/lung disease and depressed mood, treatment options, and the stigmas associated with seeking treatment.   Pulmonary Rehab from 06/26/2018 in [facility] Cardiac and  Pulmonary Rehab  Date  06/26/18  Educator  [facility]  Instruction Review Code  1- Verbalizes Understanding      Exercise & Equipment Safety: - Individual verbal instruction and demonstration of equipment use and safety with use of the equipment.   Pulmonary Rehab from 06/26/2018 in [facility] Cardiac and Pulmonary Rehab  Date  04/08/18  Educator  [facility]  Instruction Review Code  1-  Verbalizes Understanding      Infection Prevention: - Provides verbal and written material to individual with discussion of infection control including proper hand washing and proper equipment cleaning during exercise session.   Pulmonary Rehab from 06/26/2018 in [facility] Cardiac and Pulmonary Rehab  Date  04/08/18  Educator  [facility]  Instruction Review Code  1- Verbalizes Understanding      Falls Prevention: - Provides verbal and written material to individual with discussion of falls prevention and safety.   Pulmonary Rehab from 06/26/2018 in [facility] Cardiac and Pulmonary Rehab  Date  04/08/18  Educator  [facility]  Instruction Review Code  1- Verbalizes Understanding      Diabetes: - Individual verbal and written instruction to review signs/symptoms of diabetes, desired ranges of glucose level fasting, after meals and with exercise. Advice that pre and post exercise glucose checks will be done for 3 sessions at entry of program.   Chronic Lung Diseases: - Group verbal and written instruction to review updates, respiratory medications, advancements in procedures and treatments. Discuss use of supplemental oxygen including available portable oxygen systems, continuous and intermittent flow rates, concentrators, personal use and safety guidelines. Review proper use of inhaler and spacers. Provide informative websites for self-education.    Pulmonary Rehab from 06/26/2018 in [facility] Cardiac and Pulmonary Rehab  Date  05/24/18  Educator  [facility]  Instruction Review Code  1- Verbalizes Understanding      Energy Conservation: - Provide group verbal and written instruction for methods to conserve energy, plan and organize activities. Instruct on pacing techniques, use of adaptive equipment and posture/positioning to relieve shortness of breath.   Triggers and Exacerbations: - Group verbal and written instruction to review types of environmental triggers and ways to prevent exacerbations. Discuss weather  changes, air quality and the benefits of nasal washing. Review warning signs and symptoms to help prevent infections. Discuss techniques for effective airway clearance, coughing, and vibrations.   Pulmonary Rehab from 06/26/2018 in [facility] Cardiac and Pulmonary Rehab  Date  06/12/18  Educator  [facility]  Instruction Review Code  1- Verbalizes Understanding      AED/CPR: - Group verbal and written instruction with the use of models to demonstrate the basic use of the AED with the basic ABC's of resuscitation.   Pulmonary Rehab from 06/26/2018 in [facility] Cardiac and Pulmonary Rehab  Date  04/26/18  Educator  [facility]  Instruction Review Code  1- Actuary and Physiology of the Lungs: - Group verbal and written instruction with the use of models to provide basic lung anatomy and physiology related to function, structure and complications of lung disease.   Pulmonary Rehab from 06/26/2018 in [facility] Cardiac and Pulmonary Rehab  Date  05/08/18  Educator  [facility]  Instruction Review Code  1- Verbalizes Understanding      Anatomy & Physiology of the Heart: - Group verbal and written instruction and models provide basic cardiac anatomy and physiology, with the coronary electrical and arterial systems. Review of Valvular disease and Heart Failure   Pulmonary Rehab from 06/26/2018 in [facility]  Cardiac and Pulmonary Rehab  Date  06/21/18  Educator  [facility]  Instruction Review Code  1- Verbalizes Understanding      Cardiac Medications: - Group verbal and written instruction to review commonly prescribed medications for heart disease. Reviews the medication, class of the drug, and side effects.   Pulmonary Rehab from 06/26/2018 in [facility] Cardiac and Pulmonary Rehab  Date  04/24/18  Educator  [facility]  Instruction Review Code  1- Verbalizes Understanding      Know Your Numbers and Risk Factors: -Group verbal and written instruction about important numbers in your health.  Discussion of what are risk  factors and how they play a role in the disease process.  Review of Cholesterol, Blood Pressure, Diabetes, and BMI and the role they play in your overall health.   Sleep Hygiene: -Provides group verbal and written instruction about how sleep can affect your health.  Define sleep hygiene, discuss sleep cycles and impact of sleep habits. Review good sleep hygiene tips.    Pulmonary Rehab from 06/26/2018 in [facility] Cardiac and Pulmonary Rehab  Date  05/15/18  Educator  [facility]  Instruction Review Code  1- Verbalizes Understanding      Other: -Provides group and verbal instruction on various topics (see comments)    Knowledge Questionnaire Score: Knowledge Questionnaire Score - 04/08/18 1359      Knowledge Questionnaire Score   Pre Score  15/18   reviewed with patient       Core Components/Risk Factors/Patient Goals at Admission: Personal Goals and Risk Factors at Admission - 04/08/18 1432      Core Components/Risk Factors/Patient Goals on Admission    Weight Management  Yes;Weight Loss;Obesity    Intervention  Weight Management: Develop a combined nutrition and exercise program designed to reach desired caloric intake, while maintaining appropriate intake of nutrient and fiber, sodium and fats, and appropriate energy expenditure required for the weight goal.;Weight Management: Provide education and appropriate resources to help participant work on and attain dietary goals.;Weight Management/Obesity: Establish reasonable short term and long term weight goals.;Obesity: Provide education and appropriate resources to help participant work on and attain dietary goals.    Admit Weight  210 lb 12.8 oz (95.6 kg)    Goal Weight: Short Term  205 lb (93 kg)    Goal Weight: Long Term  150 lb (68 kg)    Expected Outcomes  Short Term: Continue to assess and modify interventions until short term weight is achieved;Long Term: Adherence to nutrition and physical activity/exercise program aimed toward  attainment of established weight goal;Weight Maintenance: Understanding of the daily nutrition guidelines, which includes 25-35% calories from fat, 7% or less cal from saturated fats, less than '200mg'$  cholesterol, less than 1.5gm of sodium, & 5 or more servings of fruits and vegetables daily;Weight Loss: Understanding of general recommendations for a balanced deficit meal plan, which promotes 1-2 lb weight loss per week and includes a negative energy balance of [phone] kcal/d;Understanding recommendations for meals to include 15-35% energy as protein, 25-35% energy from fat, 35-60% energy from carbohydrates, less than '200mg'$  of dietary cholesterol, 20-35 gm of total fiber daily;Understanding of distribution of calorie intake throughout the day with the consumption of 4-5 meals/snacks    Improve shortness of breath with ADL's  Yes    Intervention  Provide education, individualized exercise plan and daily activity instruction to help decrease symptoms of SOB with activities of daily living.    Expected Outcomes  Short Term: Improve cardiorespiratory fitness to achieve a reduction  of symptoms when performing ADLs;Long Term: Be able to perform more ADLs without symptoms or delay the onset of symptoms    Diabetes  Yes    Intervention  Provide education about signs/symptoms and action to take for hypo/hyperglycemia.;Provide education about proper nutrition, including hydration, and aerobic/resistive exercise prescription along with prescribed medications to achieve blood glucose in normal ranges: Fasting glucose 65-99 mg/dL    Expected Outcomes  Short Term: Participant verbalizes understanding of the signs/symptoms and immediate care of hyper/hypoglycemia, proper foot care and importance of medication, aerobic/resistive exercise and nutrition plan for blood glucose control.;Long Term: Attainment of HbA1C < 7%.    Lipids  Yes    Intervention  Provide education and support for participant on nutrition &  aerobic/resistive exercise along with prescribed medications to achieve LDL '70mg'$ , HDL >'40mg'$ .    Expected Outcomes  Short Term: Participant states understanding of desired cholesterol values and is compliant with medications prescribed. Participant is following exercise prescription and nutrition guidelines.;Long Term: Cholesterol controlled with medications as prescribed, with individualized exercise RX and with personalized nutrition plan. Value goals: LDL < '70mg'$ , HDL > 40 mg.       Core Components/Risk Factors/Patient Goals Review:  Goals and Risk Factor Review    Row Name 05/15/18 1515 06/05/18 1548           Core Components/Risk Factors/Patient Goals Review   Personal Goals Review  Weight Management/Obesity;Improve shortness of breath with ADL's;Diabetes;Lipids  Weight Management/Obesity;Improve shortness of breath with ADL's;Diabetes;Lipids      Review  Patient has been doing well in the program. Patient has been monitoring her blood sugar at home. Her shortness of breath is not where she wants it to be yet. She wants to lose weight to help her improve.   Patient states she has been able to do more of the yardwork not that the weather has got cooler. She is doing well in the program and wants to lose more weight. It is hard for her to walk alot due to her back pain. She is going to see a doctor for her pain to try to manage it better.       Expected Outcomes  Short: Attend [facility] regularly to improve shortness of breath with ADL's. Long: maintain independence with ADL's   Short: lose more weight to improve shortness of breath. Long: maintain weight loss to help with shortness of breath.         Core Components/Risk Factors/Patient Goals at Discharge (Final Review):  Goals and Risk Factor Review - 06/05/18 1548      Core Components/Risk Factors/Patient Goals Review   Personal Goals Review  Weight Management/Obesity;Improve shortness of breath with ADL's;Diabetes;Lipids    Review   Patient states she has been able to do more of the yardwork not that the weather has got cooler. She is doing well in the program and wants to lose more weight. It is hard for her to walk alot due to her back pain. She is going to see a doctor for her pain to try to manage it better.     Expected Outcomes  Short: lose more weight to improve shortness of breath. Long: maintain weight loss to help with shortness of breath.       ITP Comments: ITP Comments    Row Name 04/08/18 1339 05/06/18 0817 06/03/18 0823 07/01/18 0828     ITP Comments  Medical Evaluation completed. Chart sent for review and changes to Dr. Emily Filbert Director of [place]. Diagnosis can  be found in [facility] encounter 01/31/18  30 day review completed. ITP sent to Dr. Emily Filbert Director of [place]. Continue with ITP unless changes are made by physician  30 day review completed. ITP sent to Dr. Emily Filbert Director of [place]. Continue with ITP unless changes are made by physician  30 day review completed. ITP sent to Dr. Emily Filbert Director of [place]. Continue with ITP unless changes are made by physician.       Comments: 30 day review

## 2018-07-03 ENCOUNTER — Other Ambulatory Visit: Payer: Self-pay

## 2018-07-03 ENCOUNTER — Ambulatory Visit: Payer: Medicare Other | Attending: Rehabilitation

## 2018-07-03 DIAGNOSIS — M545 Low back pain, unspecified: Secondary | ICD-10-CM

## 2018-07-03 DIAGNOSIS — R0602 Shortness of breath: Secondary | ICD-10-CM

## 2018-07-03 DIAGNOSIS — G8929 Other chronic pain: Secondary | ICD-10-CM | POA: Diagnosis present

## 2018-07-03 DIAGNOSIS — R293 Abnormal posture: Secondary | ICD-10-CM

## 2018-07-03 DIAGNOSIS — M6281 Muscle weakness (generalized): Secondary | ICD-10-CM | POA: Diagnosis present

## 2018-07-03 DIAGNOSIS — R2689 Other abnormalities of gait and mobility: Secondary | ICD-10-CM | POA: Insufficient documentation

## 2018-07-03 NOTE — Progress Notes (Signed)
Daily Session Note  Patient Details  Name: Susan Callahan MRN: [id number] Date of Birth: 03/12/1951 Referring Provider:     Pulmonary Rehab from 04/08/2018 in [facility] Cardiac and Pulmonary Rehab  Referring Provider  Jamesetta So MD      Encounter Date: 07/03/2018  Check In: Session Check In - 07/03/18 1010      Check-In   Supervising physician immediately available to respond to emergencies  [facility] immediately available ER MD    Physician(s)  Joni Fears and Darl Householder    Location  [facility]-Cardiac & Pulmonary Rehab    Staff Present  Alberteen Sam, MA, RCEP, CCRP, Exercise Physiologist;Joseph Foy [place], [place], ACSM CEP, Exercise Physiologist    Medication changes reported      No    Fall or balance concerns reported     No    Warm-up and Cool-down  Performed as group-led instruction    Resistance Training Performed  Yes    VAD Patient?  No    PAD/SET Patient?  No      Pain Assessment   Currently in Pain?  No/denies    Multiple Pain Sites  No          Social History   Tobacco Use  Smoking Status Former Smoker  . Packs/day: 3.00  . Years: 27.00  . Pack years: 81.00  . Types: Cigarettes  . Last attempt to quit: 03/15/1991  . Years since quitting: 27.3  Smokeless Tobacco Never Used    Goals Met:  Proper associated with RPD/PD & O2 Sat Independence with exercise equipment Exercise tolerated well Personal goals reviewed No report of cardiac concerns or symptoms Strength training completed today  Goals Unmet:  Not Applicable  Comments: Pt able to follow exercise prescription today without complaint.  Will continue to monitor for progression.    Dr. Emily Filbert is Medical Director for [place] and [facility].

## 2018-07-03 NOTE — Therapy (Addendum)
[place] MAIN [facility] SERVICES [address] [place], [place], [address] Phone: [phone]   Fax:  [phone]  Physical Therapy Evaluation  Patient Details  Name: Susan Callahan MRN: [id number] Date of Birth: 1950/11/28 Referring Provider (PT): Francesco Sor   Encounter Date: 07/03/2018  PT End of Session - 07/03/18 1644    Visit Number  1    Number of Visits  8    Date for PT Re-Evaluation  07/31/18    Authorization Type  PN 1/10 begins 10/23    PT Start Time  1516    PT Stop Time  1615    PT Time Calculation (min)  59 min    Equipment Utilized During Treatment  Gait belt    Activity Tolerance  Patient tolerated treatment well       Past Medical History:  Diagnosis Date  . Diabetes mellitus without complication ([place])   . Hypertension   . Migraine   . 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 LUMBAR PERITONEAL SHUNT    . TYMPANOSTOMY TUBE PLACEMENT      There were no vitals filed for this visit.       Subjective Assessment - 07/03/18 1641    Subjective  Patient is a pleasant 67 year old female presenting to therapy with low back and leg pain. States the pain got worse near beginning of year. She enjoys going to pulmonary rehab but has difficullty with treadmill walking due to pain and has to take breaks with leaning over for pain reduction    Pertinent History  Pain began in 2007 and has gradually got worse and has progressed to nonstop. Patient states pain last all day long, everyday with no change in symptoms. Medication helps briefly. States she has difficulty with showering and standing. States she has to stand with her knee bent due to pulling sensation in legs. Pain begins immediately when stands and begins to walk. Pain feels better when she bends her knees when standing. Pain became constant and non stop around Dec 2018. Denies bowel or bladder changes, unexplained  weakness, or unexpected weight loss/gain. She currently goes to pulmonary rehab 3x a week, but has increased pain with treadmill walking. Will walk with cane on occasion around house especially at night due to occasional dizziness and vision impairments in medical history. States she also has difficulty with carrying groceries up steps due to low back pain. She would like to be able to walk better and have less pain to be less reliant on medication.     Limitations  Lifting;Standing;Walking;House hold activities    How long can you stand comfortably?  pain begins immediately     How long can you walk comfortably?  pain begins immediately     Patient Stated Goals  walk better, have less pain    Currently in Pain?  Yes    Pain Score  6     Pain Location  Back    Pain Orientation  Right;Left    Pain Descriptors / Indicators  Aching    Pain Type  Chronic pain    Pain Onset  More than a month ago    Pain Frequency  Constant    Aggravating Factors   standing, walking    Pain Relieving Factors  sitting, meds       Vitals O2: 91%  HR: 85bpm  PAIN: Current pain 6/10 Worst pain 8/10  Pain worse with standing and walking- radiates down to calf Pain only in glutes while sitting  POSTURE: Flattened low back  PROM/AROM: Lumbar AROM (degrees measured with inclinometer placed at T12) Flexion 100  extension 18*  R sidebending 18  L sidebending 14*  R rotation 50%  L rotation 50% limited **  **increases in concordant pain  STRENGTH:  Graded on a 0-5 scale Muscle Group Left Right  Hip Flex 4-/5 4-/5  Knee Flex 4-/5 4-/5  Knee Ext  4-/5 4-/5  Ankle DF 4+/5 4+/5  Ankle PF 4+/5 4+/5   SENSATION/palpation: LE sensation grossly intact  Tender to palpation in R glute med and min;  Gluteal atrophy needed in both LE  Passive Accessories/Joint mobility Hypomobile Grade II-III CPA L3-L5; L4 reproduced concordant pain Hypomobile thoracic spine  CPA T7-T12  Anterior tilt in sitting  increased concordant pain Posterior tilt in sitting- no change in pain SPECIAL TESTS: + SLR on L + slump on R  FUNCTIONAL MOBILITY: Can lay in prone, supine, and sideline without increases in pain  GAIT: Antalgic gait pattern with L LE.   OUTCOME MEASURES: TEST Outcome Interpretation  5 times sit<>stand 8 sec >60 yo, >15 sec indicates increased risk for falls  10 meter walk test               1.0  m/s <1.0 m/s indicates increased risk for falls; limited community ambulator   Modified oswestry-62%- Back pain impinges on all aspects of the patient's life. Positive interventions required   This patient presents with 3, personal factors/ comorbidities, and 4  body elements including body structures and functions, activity limitations and or participation restrictions. 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  2   LOB from turning around too fast   Has the patient had a decrease in activity level because of a fear of falling?   No    Is the patient reluctant to leave their home because of a fear of falling?   No      Home Environment   Living Environment  Private residence    Living Arrangements  Alone    Type of [place] to enter    Entrance Stairs-Number of Steps  8    Entrance Stairs-Rails  Left;Right   But not strong secured [place]  One level    [place] - single point      Prior Function   Level of Independence   Independent;Independent with basic ADLs    Vocation  Retired    Science writer    Leisure  likes to go to concerts       Cognition   Overall Cognitive Status  Within [place] for tasks  assessed       Per doctors referral: "no precautions, evaluate and treat, mechanical diagnosis and treatment, lumbar stabilization and core strength with extension bias, biomechanics, neuromobilzation as tolerated, hip and lower limb flexibility, hip mobilization, monitor for weakness and home exercise program."  Painful knees to chest  lower trunk rotations x8minutes- decreased pain to 3/10 at end of session     Objective measurements completed on examination: See above findings.              PT Education - 07/03/18 1644    Education Details  POC, goals    Person(s) Educated  Patient    Methods  Explanation;Verbal cues;Demonstration    Comprehension  Verbalized understanding;Returned demonstration       PT Short Term Goals - 07/03/18 1705      PT SHORT TERM GOAL #1   Title  Patient will be independent with completion of HEP to improve ability to complete functional tasks and functional ADLs.    Baseline  10/23: will give HEP next session    Time  2    Period  Weeks    Status  New    Target Date  07/17/18      PT SHORT TERM GOAL #2   Title  Patient will report pain score less than 4/10 on VAS to demonstrate reduction of pain levels with functional tasks.     Baseline  10/23: 6/10    Time  2    Period  Weeks    Status  New    Target Date  07/17/18        PT Long Term Goals - 07/03/18 1700      PT LONG TERM GOAL #1   Title  Patient will report pain score of <3/10 on VAS for decreased pain levels and ease with functional activities.    Baseline  10/23: 6/10    Time  4    Period  Weeks    Status  New    Target Date  07/31/18      PT LONG TERM GOAL #2   Title  Patient will be able to ambulate on treadmill for 1minutes without increase in  back/leg pain demonstrating improved community ambulation and treadmill walking at pulmonary rehab    Baseline  10/23: 11minutes increases pain    Time  4    Period  Weeks    Status  New    Target Date  07/31/18      PT LONG TERM GOAL #3   Title  Patient will have decreased modified oswestry score <50% for improved ability to complete household tasks with less pain levels.     Baseline  10/23: 62%    Time  4    Period  Weeks    Status  New    Target Date  07/31/18      PT LONG TERM GOAL #4   Title  Patient will score 4+/5 on BLE MMT to demonstrate improved glute strength and ability to carry groceries with increased ease up/down stairs at home.    Baseline  10/23: 4-/5 grossly    Time  4    Period  Weeks    Status  New    Target Date  07/31/18             Plan - 07/03/18 1657    Clinical Impression Statement  Patient presents to physical therapy with constant low back and leg pain potentially caused by lumbar stenosis. Patient  has antalgic gait pattern due to low back and leg pain with significant hypomobility of lumbar spine. Patient has limited curvature of lumbar spine also possibly impacting pelvic alignment gait deviations and posture. Slump and SLR tests reproduced concordant pain indicating possible neural tension in BLE. Patient scored 62% on modified oswestry indicating back pain impacts all aspects of patient's life. Patient will benefit from skilled physical therapy to improve mobility, reduce pain levels, and strength to improve quality of life.     History and Personal Factors relevant to plan of care:  This patient presents with 3, personal factors/ comorbidities, and 4  body elements including body structures and functions, activity limitations and or participation restrictions. Patient's condition is evolving    Clinical Presentation  Evolving    Clinical Presentation due to:  chronicity of symptoms and impact on patient's life    Clinical Decision Making  Moderate     Rehab Potential  Fair    Clinical Impairments Affecting Rehab Potential  (+) motivation (-) chronicity of symptoms    PT Frequency  2x / week    PT Duration  4 weeks    PT Treatment/Interventions  ADLs/Self Care Home Management;Cryotherapy;Moist Heat;Traction;Iontophoresis 4mg /ml Dexamethasone;Ultrasound;Gait training;Stair training;Functional mobility training;Neuromuscular re-education;Balance training;Therapeutic exercise;Therapeutic activities;Patient/family education;Manual techniques;Energy conservation;Aquatic Therapy;Electrical Stimulation;Passive range of motion;Dry needling;Taping    PT Next Visit Plan  give HEP    PT Home Exercise Plan  next session     Recommended Other Services  n/a    Consulted and Agree with Plan of Care  Patient       Patient will benefit from skilled therapeutic intervention in order to improve the following deficits and impairments:  Abnormal gait, Cardiopulmonary status limiting activity, Decreased activity tolerance, Decreased balance, Decreased endurance, Decreased range of motion, Difficulty walking, Decreased strength, Hypomobility, Decreased mobility, Impaired perceived functional ability, Impaired flexibility, Postural dysfunction, Obesity, Improper body mechanics, Pain  Visit Diagnosis: Chronic low back pain, unspecified back pain laterality, unspecified whether sciatica present  Abnormal posture  Muscle weakness (generalized)  Other abnormalities of gait and mobility     Problem List There are no active problems to display for this patient.  Erick Blinks, SPT This entire session was performed under direct supervision and direction of a licensed therapist/therapist assistant . I have personally read, edited and approve of the note as written.  Janna Arch, PT, DPT   07/04/2018, 2:59 PM  [place] MAIN [facility] SERVICES [address] [place], [place], [address] Phone: [phone]   Fax:   [phone]  Name: Susan Callahan MRN: [id number] Date of Birth: 1951-04-20

## 2018-07-05 DIAGNOSIS — R0602 Shortness of breath: Secondary | ICD-10-CM | POA: Diagnosis not present

## 2018-07-05 NOTE — Progress Notes (Signed)
Daily Session Note  Patient Details  Name: Susan Callahan MRN: [id number] Date of Birth: Feb 07, 1951 Referring Provider:     Pulmonary Rehab from 04/08/2018 in [facility] Cardiac and Pulmonary Rehab  Referring Provider  Jamesetta So MD      Encounter Date: 07/05/2018  Check In: Session Check In - 07/05/18 1004      Check-In   Supervising physician immediately available to respond to emergencies  [facility] immediately available ER MD    Physician(s)  Dr. Cherylann Banas and Corky Downs    Location  [facility]-Cardiac & Pulmonary Rehab    Staff Present  Justin Mend RCP,RRT,BSRT;Meredith Sherryll Burger, RN BSN;Leslie Van RN, BSN    Medication changes reported      No    Fall or balance concerns reported     No    Warm-up and Cool-down  Performed as group-led Higher education careers adviser Performed  Yes    VAD Patient?  No    PAD/SET Patient?  No      Pain Assessment   Currently in Pain?  No/denies          Social History   Tobacco Use  Smoking Status Former Smoker  . Packs/day: 3.00  . Years: 27.00  . Pack years: 81.00  . Types: Cigarettes  . Last attempt to quit: 03/15/1991  . Years since quitting: 27.3  Smokeless Tobacco Never Used    Goals Met:  Independence with exercise equipment Exercise tolerated well No report of cardiac concerns or symptoms Strength training completed today  Goals Unmet:  Not Applicable  Comments: Pt able to follow exercise prescription today without complaint.  Will continue to monitor for progression.    Dr. Emily Filbert is Medical Director for [place] and [facility].

## 2018-07-08 ENCOUNTER — Encounter: Payer: Medicare Other | Admitting: *Deleted

## 2018-07-08 DIAGNOSIS — R0602 Shortness of breath: Secondary | ICD-10-CM | POA: Diagnosis not present

## 2018-07-08 NOTE — Progress Notes (Signed)
Daily Session Note  Patient Details  Name: Susan Callahan MRN: [id number] Date of Birth: 1951/01/23 Referring Provider:     Pulmonary Rehab from 04/08/2018 in [facility] Cardiac and Pulmonary Rehab  Referring Provider  Jamesetta So MD      Encounter Date: 07/08/2018  Check In: Session Check In - 07/08/18 1016      Check-In   Supervising physician immediately available to respond to emergencies  [facility] immediately available ER MD    Physician(s)  Dr. Jimmye Norman and Dr. Kerman Passey    Location  [facility]-Cardiac & Pulmonary Rehab    Staff Present  Alberteen Sam, MA, RCEP, CCRP, Exercise Physiologist;Joseph [facility] [place], [place], ACSM CEP, Exercise Physiologist    Medication changes reported      No    Fall or balance concerns reported     No    Tobacco Cessation  No Change    Warm-up and Cool-down  Performed as group-led instruction    Resistance Training Performed  Yes    VAD Patient?  No    PAD/SET Patient?  No      Pain Assessment   Currently in Pain?  No/denies    Multiple Pain Sites  No          Social History   Tobacco Use  Smoking Status Former Smoker  . Packs/day: 3.00  . Years: 27.00  . Pack years: 81.00  . Types: Cigarettes  . Last attempt to quit: 03/15/1991  . Years since quitting: 27.3  Smokeless Tobacco Never Used    Goals Met:  Proper associated with RPD/PD & O2 Sat Independence with exercise equipment Exercise tolerated well No report of cardiac concerns or symptoms Strength training completed today  Goals Unmet:  Not Applicable  Comments: Pt able to follow exercise prescription today without complaint.  Will continue to monitor for progression.    Dr. Emily Filbert is Medical Director for [place] and [facility].

## 2018-07-09 ENCOUNTER — Ambulatory Visit: Payer: Medicare Other

## 2018-07-09 DIAGNOSIS — M545 Low back pain, unspecified: Secondary | ICD-10-CM

## 2018-07-09 DIAGNOSIS — M6281 Muscle weakness (generalized): Secondary | ICD-10-CM

## 2018-07-09 DIAGNOSIS — G8929 Other chronic pain: Secondary | ICD-10-CM

## 2018-07-09 DIAGNOSIS — R2689 Other abnormalities of gait and mobility: Secondary | ICD-10-CM

## 2018-07-09 DIAGNOSIS — R293 Abnormal posture: Secondary | ICD-10-CM

## 2018-07-09 NOTE — Therapy (Addendum)
[place] MAIN [facility] SERVICES [address] [place], [place], [address] Phone: [phone]   Fax:  [phone]  Physical Therapy Treatment  Patient Details  Name: Susan Callahan MRN: [id number] Date of Birth: 06-Mar-1951 Referring Provider (PT): Francesco Sor   Encounter Date: 07/09/2018  PT End of Session - 07/09/18 1424    Visit Number  2    Number of Visits  8    Date for PT Re-Evaluation  07/31/18    Authorization Type  PN 2/10 begins 10/23    PT Start Time  1431    PT Stop Time  1514    PT Time Calculation (min)  43 min    Activity Tolerance  Patient tolerated treatment well       Past Medical History:  Diagnosis Date  . Diabetes mellitus without complication ([place])   . Hypertension   . Migraine   . 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 LUMBAR PERITONEAL SHUNT    . TYMPANOSTOMY TUBE PLACEMENT      There were no vitals filed for this visit.  Subjective Assessment - 07/09/18 1432    Subjective  Patient states her back is in a "better stage" which hasn't occured in months. Patient states on sunday she noticed she was able to walk and just generally feel better. States during pulmonary rehab she was able to stand upright on level surface during treadmill walking and did not have to bend over.     Pertinent History  Pain began in 2007 and has gradually got worse and has progressed to nonstop. Patient states pain last all day long, everyday with no change in symptoms. Medication helps briefly. States she has difficulty with showering and standing. States she has to stand with her knee bent due to pulling sensation in legs. Pain begins immediately when stands and begins to walk. Pain feels better when she bends her knees when standing. Pain became constant and non stop around Dec 2018. Denies bowel or bladder changes, unexplained weakness, or unexpected weight loss/gain. She  currently goes to pulmonary rehab 3x a week, but has increased pain with treadmill walking. Will walk with cane on occasion around house especially at night due to occasional dizziness and vision impairments in medical history. States she also has difficulty with carrying groceries up steps due to low back pain. She would like to be able to walk better and have less pain to be less reliant on medication.     Limitations  Lifting;Standing;Walking;House hold activities    How long can you stand comfortably?  pain begins immediately     How long can you walk comfortably?  pain begins immediately     Patient Stated Goals  walk better, have less pain    Currently in Pain?  Yes    Pain Score  4     Pain Location  Leg    Pain Orientation  Right;Left    Pain Descriptors / Indicators  Aching    Pain Type  Chronic pain    Pain Onset  More than a month ago         Assessed hip flexion, IR, ER without any concordant pain reproduction for each LE.    Manual therapy Grade II-III CPAs UPAS T9-L5 for pain relief and improved mobility  Supine nerve glide to BLE x20 each leg.   Lower trunk rotations x20 for pain relief and mobility  Posterior pelvic tilts x10. Tactile and verbal cue for appropriate technique  TrA activation x10 5 second holds. Verbal and visual cues for proper exercise technique and activation   Adduction ball squeeze in hooklying x10 5 second holds with TrA activation. Verbal cues to decrease abdominal contraction for appropriate technique.   Marches x10 each LE with TrA activation. Verbal cues to complete slowly.  Bridges x10. Verbal cues for small lift to prevent RLE muscle spasm.   Completed hamstring stretch and STM during muscle spasms for relief during bridges and adduction ball squeezes.     Access Code: [email or web address]  URL: [email or web address]  Date: 07/09/2018  Prepared by: Janna Arch   Exercises  Supine Posterior Pelvic Tilt - 10 reps - 2 sets - 5  hold - 1x daily - 7x weekly  Supine Transversus Abdominis Palpation - 10 reps - 2 sets - 5 hold - 1x daily - 7x weekly  Supine Bridge - 10 reps - 2 sets - 3 hold - 1x daily - 7x weekly                PT Education - 07/09/18 1424    Education Details  exercise technique, HEP    Person(s) Educated  Patient    Methods  Explanation;Demonstration;Verbal cues;Handout    Comprehension  Verbalized understanding;Returned demonstration       PT Short Term Goals - 07/03/18 1705      PT SHORT TERM GOAL #1   Title  Patient will be independent with completion of HEP to improve ability to complete functional tasks and functional ADLs.    Baseline  10/23: will give HEP next session    Time  2    Period  Weeks    Status  New    Target Date  07/17/18      PT SHORT TERM GOAL #2   Title  Patient will report pain score less than 4/10 on VAS to demonstrate reduction of pain levels with functional tasks.     Baseline  10/23: 6/10    Time  2    Period  Weeks    Status  New    Target Date  07/17/18        PT Long Term Goals - 07/03/18 1700      PT LONG TERM GOAL #1   Title  Patient will report pain score of <3/10 on VAS for decreased pain levels and ease with functional activities.    Baseline  10/23: 6/10    Time  4    Period  Weeks    Status  New    Target Date  07/31/18      PT LONG TERM GOAL #2   Title  Patient will be able to ambulate on treadmill for 81minutes without increase in back/leg pain demonstrating improved community ambulation and treadmill walking at pulmonary rehab    Baseline  10/23: 64minutes increases pain    Time  4    Period  Weeks    Status  New    Target Date  07/31/18      PT LONG TERM GOAL #3   Title  Patient will have decreased modified oswestry score <50% for improved ability to complete household tasks with less pain levels.     Baseline  10/23: 62%    Time  4    Period  Weeks    Status  New    Target Date  07/31/18      PT LONG TERM GOAL #  4    Title  Patient will score 4+/5 on BLE MMT to demonstrate improved glute strength and ability to carry groceries with increased ease up/down stairs at home.    Baseline  10/23: 4-/5 grossly    Time  4    Period  Weeks    Status  New    Target Date  07/31/18            Plan - 07/09/18 1615    Clinical Impression Statement  Patient arrives to therapy for first follow up appointment with improvement in low back and leg pain. Completed manual therapy at beginning of session for pain relief and improved lumbar and thoracic mobility. Introduced core stabilization and strengthening exercises to improve core activation and improve functional strength with addition of HEP. Patient had decreased pain at end of session (4/10-0/10) with reports of mild soreness and improved gait mechanics. Patient will continue to benefit from skilled physical therapy to reduce pain levels, improve mobility and strength to improve quality of life.       Rehab Potential  Fair    Clinical Impairments Affecting Rehab Potential  (+) motivation (-) chronicity of symptoms    PT Frequency  2x / week    PT Duration  4 weeks    PT Treatment/Interventions  ADLs/Self Care Home Management;Cryotherapy;Moist Heat;Traction;Iontophoresis 4mg /ml Dexamethasone;Ultrasound;Gait training;Stair training;Functional mobility training;Neuromuscular re-education;Balance training;Therapeutic exercise;Therapeutic activities;Patient/family education;Manual techniques;Energy conservation;Aquatic Therapy;Electrical Stimulation;Passive range of motion;Dry needling;Taping    PT Next Visit Plan  nerve glides, strengthening    PT Home Exercise Plan  see sheet    Consulted and Agree with Plan of Care  Patient       Patient will benefit from skilled therapeutic intervention in order to improve the following deficits and impairments:  Abnormal gait, Cardiopulmonary status limiting activity, Decreased activity tolerance, Decreased balance, Decreased  endurance, Decreased range of motion, Difficulty walking, Decreased strength, Hypomobility, Decreased mobility, Impaired perceived functional ability, Impaired flexibility, Postural dysfunction, Obesity, Improper body mechanics, Pain  Visit Diagnosis: Chronic low back pain, unspecified back pain laterality, unspecified whether sciatica present  Abnormal posture  Muscle weakness (generalized)  Other abnormalities of gait and mobility     Problem List There are no active problems to display for this patient.  Erick Blinks, SPT  This entire session was performed under direct supervision and direction of a licensed therapist/therapist assistant . I have personally read, edited and approve of the note as written.  Janna Arch, PT, DPT   07/10/2018, 8:15 AM  [place] MAIN [facility] SERVICES [address] [place], [place], [address] Phone: [phone]   Fax:  [phone]  Name: Susan Callahan MRN: [id number] Date of Birth: June 24, 1951

## 2018-07-09 NOTE — Patient Instructions (Signed)
Access Code: [email or web address]  URL: [email or web address]  Date: 07/09/2018  Prepared by: Janna Arch   Exercises  Supine Posterior Pelvic Tilt - 10 reps - 2 sets - 5 hold - 1x daily - 7x weekly  Supine Transversus Abdominis Palpation - 10 reps - 2 sets - 5 hold - 1x daily - 7x weekly  Supine Bridge - 10 reps - 2 sets - 3 hold - 1x daily - 7x weekly

## 2018-07-10 DIAGNOSIS — R0602 Shortness of breath: Secondary | ICD-10-CM

## 2018-07-10 NOTE — Progress Notes (Signed)
Daily Session Note  Patient Details  Name: Susan Callahan MRN: [id number] Date of Birth: 24-May-1951 Referring Provider:     Pulmonary Rehab from 04/08/2018 in [facility]  Referring Provider  Jamesetta So MD      Encounter Date: 07/10/2018  Check In: Session Check In - 07/10/18 1138      Check-In   Supervising physician immediately available to respond to emergencies  [facility] immediately available ER MD    Physician(s)  Dr. Reita Cliche and Quentin Cornwall    Location  [facility]-Cardiac & Pulmonary Rehab    Staff Present  Alberteen Sam, MA, RCEP, CCRP, Exercise Physiologist;Joseph [facility], [place], ACSM CEP, Exercise Physiologist    Medication changes reported      No    Fall or balance concerns reported     No    Tobacco Cessation  No Change    Warm-up and Cool-down  Performed as group-led instruction    Resistance Training Performed  Yes    VAD Patient?  No    PAD/SET Patient?  No      Pain Assessment   Currently in Pain?  No/denies          Social History   Tobacco Use  Smoking Status Former Smoker  . Packs/day: 3.00  . Years: 27.00  . Pack years: 81.00  . Types: Cigarettes  . Last attempt to quit: 03/15/1991  . Years since quitting: 27.3  Smokeless Tobacco Never Used    Goals Met:  Proper associated with RPD/PD & O2 Sat Independence with exercise equipment Using PLB without cueing & demonstrates good technique Exercise tolerated well No report of cardiac concerns or symptoms Strength training completed today  Goals Unmet:  Not Applicable  Comments: Pt able to follow exercise prescription today without complaint.  Will continue to monitor for progression.    Dr. Emily Filbert is Medical Director for [place] and [facility].

## 2018-07-11 ENCOUNTER — Ambulatory Visit: Payer: Medicare Other

## 2018-07-11 VITALS — BP 154/78 | HR 72

## 2018-07-11 DIAGNOSIS — M545 Low back pain, unspecified: Secondary | ICD-10-CM

## 2018-07-11 DIAGNOSIS — G8929 Other chronic pain: Secondary | ICD-10-CM

## 2018-07-11 DIAGNOSIS — M6281 Muscle weakness (generalized): Secondary | ICD-10-CM

## 2018-07-11 DIAGNOSIS — R293 Abnormal posture: Secondary | ICD-10-CM

## 2018-07-11 NOTE — Therapy (Signed)
[place] MAIN [facility] SERVICES [address] [place], [place], [address] Phone: [phone]   Fax:  [phone]  Physical Therapy Treatment  Patient Details  Name: Susan Callahan MRN: [id number] Date of Birth: 01-Dec-1950 Referring Provider (PT): Francesco Sor   Encounter Date: 07/11/2018  PT End of Session - 07/11/18 0935    Visit Number  3    Number of Visits  8    Date for PT Re-Evaluation  07/31/18    Authorization Type  PN 3/10 begins 10/23    PT Start Time  0932    PT Stop Time  1015    PT Time Calculation (min)  43 min    Activity Tolerance  Patient tolerated treatment well    Behavior During Therapy  [facility] for tasks assessed/performed       Past Medical History:  Diagnosis Date  . Diabetes mellitus without complication ([place])   . Hypertension   . Migraine   . 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 LUMBAR PERITONEAL SHUNT    . TYMPANOSTOMY TUBE PLACEMENT      Vitals:   07/11/18 0933  BP: (!) 154/78  Pulse: 72  SpO2: 95%    Subjective Assessment - 07/11/18 0933    Subjective  Pt reports that her back is "not a happy camper this morning." She is unable to pinpoint any specific cause for increase in back pain. Complaining of 6/10 pain today.     Pertinent History  Pain began in 2007 and has gradually got worse and has progressed to nonstop. Patient states pain last all day long, everyday with no change in symptoms. Medication helps briefly. States she has difficulty with showering and standing. States she has to stand with her knee bent due to pulling sensation in legs. Pain begins immediately when stands and begins to walk. Pain feels better when she bends her knees when standing. Pain became constant and non stop around Dec 2018. Denies bowel or bladder changes, unexplained weakness, or unexpected weight loss/gain. She currently goes to pulmonary rehab 3x a week, but  has increased pain with treadmill walking. Will walk with cane on occasion around house especially at night due to occasional dizziness and vision impairments in medical history. States she also has difficulty with carrying groceries up steps due to low back pain. She would like to be able to walk better and have less pain to be less reliant on medication.     Limitations  Lifting;Standing;Walking;House hold activities    How long can you stand comfortably?  pain begins immediately     How long can you walk comfortably?  pain begins immediately     Patient Stated Goals  walk better, have less pain    Currently in Pain?  Yes    Pain Score  6     Pain Location  Back    Pain Orientation  Right;Left    Pain Descriptors / Indicators  Tightness;Sore    Pain Radiating Towards  Bilateral LEs    Pain Onset  More than a month ago          TREATMENT   Manual Therapy  L Callahan knee to chest stretch x 30s, pt does not feel stretch on RLE; FABER and FADIR stretching 30s hold each bilateral; Hooklying rotation stretch 20s hold x 2 bilateral; Belt assisted inferior hip mobilizations, grade III, 30s/bout x 2 bouts each; Belt assisted  long axis hip distraction with oscillation x 30s bilateral; Hooklying HS stretch with DF/PF for sciatic nerve mobility x 30s bilateral;   Ther-ex Posterior pelvic tilts x10. tactile and verbal cue for appropriate technique TrA activation x10 5 second holds. Verbal and visual cues for proper exercise technique and activation  Adduction ball squeeze in hooklying 3 second holds x 10;  Marches x 10 each LE with TrA activation. Verbal cues to complete slowly.   Pt educated throughout session about proper posture and technique with exercises. Improved exercise technique, movement at target joints, use of target muscles after min to mod verbal, visual, tactile cues.    Pt demonstrates excellent motivation today. Discussed the possibility of a few aquatic sessions and pt  is very interested in this idea. She has a history of Ramsay Hunt syndrome with gentamyacin ablation many years ago but has persistent dizziness. She has been seen at [facility] in the past for this issue. After working on her back pain she expresses an interest in working on some vestibular rehab. Patient will continue to benefit from skilled physical therapy to reduce pain levels, improve mobility and strength to improve quality of life.                         PT Short Term Goals - 07/03/18 1705      PT SHORT TERM GOAL #1   Title  Patient will be independent with completion of HEP to improve ability to complete functional tasks and functional ADLs.    Baseline  10/23: will give HEP next session    Time  2    Period  Weeks    Status  New    Target Date  07/17/18      PT SHORT TERM GOAL #2   Title  Patient will report pain score less than 4/10 on VAS to demonstrate reduction of pain levels with functional tasks.     Baseline  10/23: 6/10    Time  2    Period  Weeks    Status  New    Target Date  07/17/18        PT Long Term Goals - 07/03/18 1700      PT LONG TERM GOAL #1   Title  Patient will report pain score of <3/10 on VAS for decreased pain levels and ease with functional activities.    Baseline  10/23: 6/10    Time  4    Period  Weeks    Status  New    Target Date  07/31/18      PT LONG TERM GOAL #2   Title  Patient will be able to ambulate on treadmill for 41minutes without increase in back/leg pain demonstrating improved community ambulation and treadmill walking at pulmonary rehab    Baseline  10/23: 33minutes increases pain    Time  4    Period  Weeks    Status  New    Target Date  07/31/18      PT LONG TERM GOAL #3   Title  Patient will have decreased modified oswestry score <50% for improved ability to complete household tasks with less pain levels.     Baseline  10/23: 62%    Time  4    Period  Weeks    Status  New    Target Date  07/31/18       PT LONG TERM GOAL #4   Title  Patient will  score 4+/5 on BLE MMT to demonstrate improved glute strength and ability to carry groceries with increased ease up/down stairs at home.    Baseline  10/23: 4-/5 grossly    Time  4    Period  Weeks    Status  New    Target Date  07/31/18            Plan - 07/11/18 0940    Clinical Impression Statement  Pt demonstrates excellent motivation today. Discussed the possibility of a few aquatic sessions and pt is very interested in this idea. She has a history of Ramsay Hunt syndrome with gentamyacin ablation many years ago but has persistent dizziness. She has been seen at [facility] in the past for this issue. After working on her back pain she expresses an interest in working on some vestibular rehab. Patient will continue to benefit from skilled physical therapy to reduce pain levels, improve mobility and strength to improve quality of life.       Rehab Potential  Fair    Clinical Impairments Affecting Rehab Potential  (+) motivation (-) chronicity of symptoms    PT Frequency  2x / week    PT Duration  4 weeks    PT Treatment/Interventions  ADLs/Self Care Home Management;Cryotherapy;Moist Heat;Traction;Iontophoresis 4mg /ml Dexamethasone;Ultrasound;Gait training;Stair training;Functional mobility training;Neuromuscular re-education;Balance training;Therapeutic exercise;Therapeutic activities;Patient/family education;Manual techniques;Energy conservation;Aquatic Therapy;Electrical Stimulation;Passive range of motion;Dry needling;Taping    PT Next Visit Plan  nerve glides, strengthening    PT Home Exercise Plan  see sheet    Consulted and Agree with Plan of Care  Patient       Patient will benefit from skilled therapeutic intervention in order to improve the following deficits and impairments:  Abnormal gait, Cardiopulmonary status limiting activity, Decreased activity tolerance, Decreased balance, Decreased endurance, Decreased range of motion, Difficulty  walking, Decreased strength, Hypomobility, Decreased mobility, Impaired perceived functional ability, Impaired flexibility, Postural dysfunction, Obesity, Improper body mechanics, Pain  Visit Diagnosis: Chronic low back pain, unspecified back pain laterality, unspecified whether sciatica present  Abnormal posture  Muscle weakness (generalized)     Problem List There are no active problems to display for this patient.   Phillips Grout PT, DPT, GCS  Susan Callahan 07/11/2018, 1:21 PM  [place] MAIN [facility] SERVICES [address] [place], [place], [address] Phone: [phone]   Fax:  [phone]  Name: Susan Callahan MRN: [id number] Date of Birth: 08/01/1951

## 2018-07-12 ENCOUNTER — Encounter: Payer: Medicare Other | Attending: Pulmonary Disease

## 2018-07-12 DIAGNOSIS — R0602 Shortness of breath: Secondary | ICD-10-CM | POA: Insufficient documentation

## 2018-07-12 DIAGNOSIS — Z87891 Personal history of nicotine dependence: Secondary | ICD-10-CM | POA: Diagnosis not present

## 2018-07-12 NOTE — Progress Notes (Signed)
Daily Session Note  Patient Details  Name: Susan Callahan MRN: [id number] Date of Birth: Aug 26, 1951 Referring Provider:     Pulmonary Rehab from 04/08/2018 in [facility] and Pul[facility] Rehab  Referring Provider  Jamesetta So MD      Encounter Date: 07/12/2018  Check In: Session Check In - 07/12/18 1004      Check-In   Supervising physician immediately available to respond to emergencies  [facility] immediately available ER MD    Physician(s)  Dr. Cherylann Banas    Location  [facility]-Cardiac & Pulmonary Rehab    Staff Present  Alberteen Sam, MA, RCEP, CCRP, Exercise Physiologist;Chandelle Harkey Alcus Dad, RN BSN    Medication changes reported      No    Fall or balance concerns reported     No    Warm-up and Cool-down  Performed as group-led Higher education careers adviser Performed  Yes    VAD Patient?  No    PAD/SET Patient?  No      Pain Assessment   Currently in Pain?  No/denies          Social History   Tobacco Use  Smoking Status Former Smoker  . Packs/day: 3.00  . Years: 27.00  . Pack years: 81.00  . Types: Cigarettes  . Last attempt to quit: 03/15/1991  . Years since quitting: 27.3  Smokeless Tobacco Never Used    Goals Met:  Independence with exercise equipment Exercise tolerated well No report of cardiac concerns or symptoms Strength training completed today  Goals Unmet:  Not Applicable  Comments: Pt able to follow exercise prescription today without complaint.  Will continue to monitor for progression.    Dr. Emily Filbert is Medical Director for [place] and [facility].

## 2018-07-15 ENCOUNTER — Encounter: Payer: Medicare Other | Admitting: *Deleted

## 2018-07-15 VITALS — Ht 65.1 in | Wt 216.0 lb

## 2018-07-15 DIAGNOSIS — R0602 Shortness of breath: Secondary | ICD-10-CM | POA: Diagnosis not present

## 2018-07-15 NOTE — Progress Notes (Signed)
Daily Session Note  Patient Details  Name: Susan Callahan MRN: [id number] Date of Birth: 12-27-1950 Referring Provider:     Pulmonary Rehab from 04/08/2018 in [facility] Cardiac and Pulmonary Rehab  Referring Provider  Jamesetta So MD      Encounter Date: 07/15/2018  Check In: Session Check In - 07/15/18 1056      Check-In   Supervising physician immediately available to respond to emergencies  [facility] immediately available ER MD    Physician(s)  Dr. Corky Downs and Dr. Joni Fears    Location  [facility]-Cardiac & Pulmonary Rehab    Staff Present  Other;Joseph Tessie Fass Jaci Carrel, BS, ACSM CEP, Exercise Physiologist    Medication changes reported      No    Fall or balance concerns reported     No    Tobacco Cessation  No Change    Warm-up and Cool-down  Performed as group-led instruction    Resistance Training Performed  Yes    VAD Patient?  No    PAD/SET Patient?  No      Pain Assessment   Currently in Pain?  No/denies    Multiple Pain Sites  No          Social History   Tobacco Use  Smoking Status Former Smoker  . Packs/day: 3.00  . Years: 27.00  . Pack years: 81.00  . Types: Cigarettes  . Last attempt to quit: 03/15/1991  . Years since quitting: 27.3  Smokeless Tobacco Never Used    Goals Met:  Proper associated with RPD/PD & O2 Sat Independence with exercise equipment Exercise tolerated well Personal goals reviewed No report of cardiac concerns or symptoms Strength training completed today  Goals Unmet:  Not Applicable  Comments: Pt able to follow exercise prescription today without complaint.  Will continue to monitor for progression. [place] Name 04/08/18 1429 07/15/18 1057       6 Minute Walk   Phase  Initial  Discharge    Distance  1475 feet  1475 feet    Distance % Change  -  0 %    Distance Feet Change  -  0 ft    Walk Time  6 minutes  6 minutes    # of Rest Breaks  0  0    MPH  2.79  2.79    METS  3.25  3.63    RPE  13  13     Perceived Dyspnea   3  3    VO2 Peak  11.39  12.69    Symptoms  Yes (comment)  No    Comments  SOB  -    Resting HR  68 bpm  86 bpm    Resting BP  144/72  138/72    Resting Oxygen Saturation   95 %  96 %    Exercise Oxygen Saturation  during 6 min walk  87 %  87 %    Max Ex. HR  112 bpm  129 bpm    Max Ex. BP  154/86  180/80    2 Minute Post BP  146/80  140/82      Interval HR   1 Minute HR  83  104    2 Minute HR  108  122    3 Minute HR  106  124    4 Minute HR  111  -    5 Minute HR  114  -    6  Minute HR  112  129    2 Minute Post HR  81  101    Interval Heart Rate?  Yes  Yes      Interval Oxygen   Interval Oxygen?  Yes  Yes    Baseline Oxygen Saturation %  95 %  96 %    1 Minute Oxygen Saturation %  91 %  93 %    1 Minute Liters of Oxygen  0 L Room Air  0 L    2 Minute Oxygen Saturation %  91 %  88 %    2 Minute Liters of Oxygen  0 L  0 L    3 Minute Oxygen Saturation %  87 %  88 %    3 Minute Liters of Oxygen  0 L  0 L    4 Minute Oxygen Saturation %  93 %  - no reading (battery died)    4 Minute Liters of Oxygen  0 L  0 L    5 Minute Oxygen Saturation %  93 %  - no reading (battery died)    5 Minute Liters of Oxygen  0 L  0 L    6 Minute Oxygen Saturation %  94 %  87 %    6 Minute Liters of Oxygen  0 L  0 L    2 Minute Post Oxygen Saturation %  96 %  93 %    2 Minute Post Liters of Oxygen  0 L  -         Dr. Emily Filbert is Medical Director for [facility] and [facility].

## 2018-07-16 ENCOUNTER — Ambulatory Visit: Payer: Medicare Other | Attending: Rehabilitation

## 2018-07-16 DIAGNOSIS — G8929 Other chronic pain: Secondary | ICD-10-CM | POA: Diagnosis present

## 2018-07-16 DIAGNOSIS — R293 Abnormal posture: Secondary | ICD-10-CM | POA: Diagnosis present

## 2018-07-16 DIAGNOSIS — M545 Low back pain, unspecified: Secondary | ICD-10-CM

## 2018-07-16 DIAGNOSIS — M6281 Muscle weakness (generalized): Secondary | ICD-10-CM

## 2018-07-16 DIAGNOSIS — R2689 Other abnormalities of gait and mobility: Secondary | ICD-10-CM | POA: Diagnosis present

## 2018-07-16 NOTE — Therapy (Addendum)
[place] MAIN [facility] SERVICES [address] [place], [place], [address] Phone: [phone]   Fax:  [phone]  Physical Therapy Treatment  Patient Details  Name: Susan Callahan MRN: [id number] Date of Birth: 1951/07/07 Referring Provider (PT): Francesco Sor   Encounter Date: 07/16/2018  PT End of Session - 07/16/18 1202    Visit Number  4    Number of Visits  8    Date for PT Re-Evaluation  07/31/18    Authorization Type  PN 4/10 begins 10/23    PT Start Time  1116    PT Stop Time  1200    PT Time Calculation (min)  44 min    Activity Tolerance  Patient tolerated treatment well    Behavior During Therapy  [employer] for tasks assessed/performed       Past Medical History:  Diagnosis Date  . Diabetes mellitus without complication ([place])   . Hypertension   . Migraine   . 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 LUMBAR PERITONEAL SHUNT    . TYMPANOSTOMY TUBE PLACEMENT      There were no vitals filed for this visit.  Subjective Assessment - 07/16/18 1119    Subjective  Patient reports she has increased back pain today. States she had to do a walking test in pulmonary rehab yesterday and she feels like she ran a marathon. Reports HEP compliance.     Pertinent History  Pain began in 2007 and has gradually got worse and has progressed to nonstop. Patient states pain last all day long, everyday with no change in symptoms. Medication helps briefly. States she has difficulty with showering and standing. States she has to stand with her knee bent due to pulling sensation in legs. Pain begins immediately when stands and begins to walk. Pain feels better when she bends her knees when standing. Pain became constant and non stop around Dec 2018. Denies bowel or bladder changes, unexplained weakness, or unexpected weight loss/gain. She currently goes to pulmonary rehab 3x a week, but has increased  pain with treadmill walking. Will walk with cane on occasion around house especially at night due to occasional dizziness and vision impairments in medical history. States she also has difficulty with carrying groceries up steps due to low back pain. She would like to be able to walk better and have less pain to be less reliant on medication.     Limitations  Lifting;Standing;Walking;House hold activities    How long can you stand comfortably?  pain begins immediately     How long can you walk comfortably?  pain begins immediately     Patient Stated Goals  walk better, have less pain    Currently in Pain?  Yes    Pain Score  8     Pain Location  Back    Pain Orientation  Lower    Pain Descriptors / Indicators  Aching    Pain Type  Chronic pain    Pain Onset  More than a month ago    Multiple Pain Sites  Yes    Pain Score  8    Pain Location  Leg    Pain Orientation  Right;Left    Pain Descriptors / Indicators  Tightness;Burning    Pain Type  Chronic pain            Manual Therapy  L single knee to chest stretch x 30s, pt  does not feel stretch on RLE; Hooklying rotation rocking x20 for pain relief and mobility  Hooklying HS stretch with DF/PF for sciatic nerve mobility 2x 30s bilateral; CPA UPA T7-L5 grade II for pain relief and improved mobility. L5 most concordant    Ther-ex Posterior pelvic tilts x10. tactile and verbal cue for appropriate technique TrA activation x10 5 second holds. Verbal and visual cues for proper exercise technique and activation  Adduction ball squeeze in hooklying 3 second holds x 10;  Marches x 10 each LE with TrA activation. Verbal cues to complete slowly. Scapular retractions- x10 3 second holds. Verbal cues to squeeze shoulder blades   Pt educated throughout session about proper posture and technique with exercises. Improved exercise technique, movement at target joints, use of target muscles after min to mod verbal, visual, tactile cues.   Pain  decreased from 8/10 to 0/10 in legs and back while ambulating at end of session.                  PT Education - 07/16/18 1202    Education Details  exercise technique, HEP, nerve glides    Person(s) Educated  Patient    Methods  Explanation;Demonstration;Verbal cues    Comprehension  Verbalized understanding;Returned demonstration       PT Short Term Goals - 07/03/18 1705      PT SHORT TERM GOAL #1   Title  Patient will be independent with completion of HEP to improve ability to complete functional tasks and functional ADLs.    Baseline  10/23: will give HEP next session    Time  2    Period  Weeks    Status  New    Target Date  07/17/18      PT SHORT TERM GOAL #2   Title  Patient will report pain score less than 4/10 on VAS to demonstrate reduction of pain levels with functional tasks.     Baseline  10/23: 6/10    Time  2    Period  Weeks    Status  New    Target Date  07/17/18        PT Long Term Goals - 07/03/18 1700      PT LONG TERM GOAL #1   Title  Patient will report pain score of <3/10 on VAS for decreased pain levels and ease with functional activities.    Baseline  10/23: 6/10    Time  4    Period  Weeks    Status  New    Target Date  07/31/18      PT LONG TERM GOAL #2   Title  Patient will be able to ambulate on treadmill for 58minutes without increase in back/leg pain demonstrating improved community ambulation and treadmill walking at pulmonary rehab    Baseline  10/23: 9minutes increases pain    Time  4    Period  Weeks    Status  New    Target Date  07/31/18      PT LONG TERM GOAL #3   Title  Patient will have decreased modified oswestry score <50% for improved ability to complete household tasks with less pain levels.     Baseline  10/23: 62%    Time  4    Period  Weeks    Status  New    Target Date  07/31/18      PT LONG TERM GOAL #4   Title  Patient will score 4+/5 on BLE  MMT to demonstrate improved glute strength and ability  to carry groceries with increased ease up/down stairs at home.    Baseline  10/23: 4-/5 grossly    Time  4    Period  Weeks    Status  New    Target Date  07/31/18            Plan - 07/16/18 1237    Clinical Impression Statement  Completed manual therapy and nerve glides with improvement in symptoms and centralization following intervention.  Patient had difficulty maintaining TrA contraction due to decreased core strength and activation and required cueing. Patient had a decrease in pain from 8/10-0/10 at end of session while ambulating with improved gait mechanics. Educated patient on nerve glides and added seated nerve glide to HEP. Patient will continue to benefit from skilled physical therapy to reduce pain levels, improve mobility, and strength to improve quality of life.     Rehab Potential  Fair    Clinical Impairments Affecting Rehab Potential  (+) motivation (-) chronicity of symptoms    PT Frequency  2x / week    PT Duration  4 weeks    PT Treatment/Interventions  ADLs/Self Care Home Management;Cryotherapy;Moist Heat;Traction;Iontophoresis 4mg /ml Dexamethasone;Ultrasound;Gait training;Stair training;Functional mobility training;Neuromuscular re-education;Balance training;Therapeutic exercise;Therapeutic activities;Patient/family education;Manual techniques;Energy conservation;Aquatic Therapy;Electrical Stimulation;Passive range of motion;Dry needling;Taping    PT Next Visit Plan  nerve glides, strengthening    PT Home Exercise Plan  see sheet    Consulted and Agree with Plan of Care  Patient       Patient will benefit from skilled therapeutic intervention in order to improve the following deficits and impairments:  Abnormal gait, Cardiopulmonary status limiting activity, Decreased activity tolerance, Decreased balance, Decreased endurance, Decreased range of motion, Difficulty walking, Decreased strength, Hypomobility, Decreased mobility, Impaired perceived functional ability,  Impaired flexibility, Postural dysfunction, Obesity, Improper body mechanics, Pain  Visit Diagnosis: Chronic low back pain, unspecified back pain laterality, unspecified whether sciatica present  Abnormal posture  Muscle weakness (generalized)  Other abnormalities of gait and mobility     Problem List There are no active problems to display for this patient.  Erick Blinks, SPT  This entire session was performed under direct supervision and direction of a licensed therapist/therapist assistant . I have personally read, edited and approve of the note as written.  Janna Arch, PT, DPT   07/16/2018, 12:51 PM  [place] MAIN [facility] SERVICES [address] [place], [place], [address] Phone: [phone]   Fax:  [phone]  Name: Susan Callahan MRN: [id number] Date of Birth: [birth year]

## 2018-07-18 ENCOUNTER — Ambulatory Visit: Payer: Medicare Other

## 2018-07-18 DIAGNOSIS — R2689 Other abnormalities of gait and mobility: Secondary | ICD-10-CM

## 2018-07-18 DIAGNOSIS — R293 Abnormal posture: Secondary | ICD-10-CM

## 2018-07-18 DIAGNOSIS — M6281 Muscle weakness (generalized): Secondary | ICD-10-CM

## 2018-07-18 DIAGNOSIS — G8929 Other chronic pain: Secondary | ICD-10-CM

## 2018-07-18 DIAGNOSIS — M545 Low back pain: Principal | ICD-10-CM

## 2018-07-18 NOTE — Patient Instructions (Signed)
Access Code: [email or web address]  URL: [email or web address]  Date: 07/18/2018  Prepared by: Janna Arch   Exercises  Seated Sciatic Tensioner - 10 reps - 2 sets - 5 hold - 1x daily - 7x weekly

## 2018-07-18 NOTE — Therapy (Addendum)
[place] MAIN [facility] SERVICES [address] [place], [place], [address] Phone: [phone]   Fax:  [phone]  Physical Therapy Treatment  Patient Details  Name: Susan Callahan MRN: [id number] Date of Birth: [birth year] Referring Provider (PT): Francesco Sor   Encounter Date: 07/18/2018  PT End of Session - 07/18/18 1103    Visit Number  5    Number of Visits  8    Date for PT Re-Evaluation  07/31/18    Authorization Type  PN 5/10 begins 10/23    PT Start Time  1029    PT Stop Time  1114    PT Time Calculation (min)  45 min    Activity Tolerance  Patient tolerated treatment well    Behavior During Therapy  [facility] for tasks assessed/performed       Past Medical History:  Diagnosis Date  . Diabetes mellitus without complication ([place])   . Hypertension   . Migraine   . 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 LUMBAR PERITONEAL SHUNT    . TYMPANOSTOMY TUBE PLACEMENT      There were no vitals filed for this visit.  Subjective Assessment - 07/18/18 1031    Subjective  Patient states she felt good when she left here on Tuesday but it wore off throughout the day (decrease in pain for few hours). States she tried to lift 16lb bag yesterday but back really bothered her. States she has been very mindful of movement and to walk and stretching. States she has been doing her HEP.     Pertinent History  Pain began in 2007 and has gradually got worse and has progressed to nonstop. Patient states pain last all day long, everyday with no change in symptoms. Medication helps briefly. States she has difficulty with showering and standing. States she has to stand with her knee bent due to pulling sensation in legs. Pain begins immediately when stands and begins to walk. Pain feels better when she bends her knees when standing. Pain became constant and non stop around Dec 2018. Denies bowel or bladder  changes, unexplained weakness, or unexpected weight loss/gain. She currently goes to pulmonary rehab 3x a week, but has increased pain with treadmill walking. Will walk with cane on occasion around house especially at night due to occasional dizziness and vision impairments in medical history. States she also has difficulty with carrying groceries up steps due to low back pain. She would like to be able to walk better and have less pain to be less reliant on medication.     Limitations  Lifting;Standing;Walking;House hold activities    How long can you stand comfortably?  pain begins immediately     How long can you walk comfortably?  pain begins immediately     Patient Stated Goals  walk better, have less pain    Currently in Pain?  Yes    Pain Score  7     Pain Location  Back    Pain Orientation  Lower    Pain Descriptors / Indicators  Aching;Sore    Pain Type  Chronic pain    Pain Onset  More than a month ago    Multiple Pain Sites  Yes    Pain Score  6    Pain Location  Leg    Pain Orientation  Right;Left    Pain Descriptors / Indicators  Tightness;Burning    Pain Type  Chronic pain            Manual Therapy  L single knee to chest stretch x 30s, pt does not feel stretch on RLE; Hooklying rotation rocking x20 for pain relief and mobility  Hooklying HS stretch with DF/PF for sciatic nerve mobility 2x 30s bilateral; CPA UPA T7-L5 grade II for pain relief and improved mobility. L5 CPA and RUE most concordant     Ther-ex Posterior pelvic tilts 2x10 3 seconds holds. tactile and verbal cue for appropriate technique TrA activation x10 5 second holds in hooklying. Verbal and visual cues for proper exercise technique and activation  Adduction ball squeeze in hooklying 3 second holds x 10;  Marches x 10 each LE with TrA activation. Verbal cues to complete slowly. Seated sciatic nerve glides x10 each LE. Visual and verbal cues for technique   Pt educated throughout session about  proper posture and technique with exercises.   Reassessd pain while sitting and ambulating at end of sessin. Pain decreased from 7/10 to 0/10 in back while ambulating at end of session. 6/10-5/10 decrease in pain in legs with centralization (pain at calfs instead of ankles)      Access Code: [email or web address]  URL: [email or web address]  Date: 07/18/2018  Prepared by: Janna Arch   Exercises  Seated Sciatic Tensioner - 10 reps - 2 sets - 5 hold - 1x daily - 7x weekly                    PT Education - 07/18/18 1025    Education Details  exercise technique, HEP, nerve glides    Person(s) Educated  Patient    Methods  Explanation;Demonstration;Verbal cues;Handout    Comprehension  Verbalized understanding;Returned demonstration       PT Short Term Goals - 07/03/18 1705      PT SHORT TERM GOAL #1   Title  Patient will be independent with completion of HEP to improve ability to complete functional tasks and functional ADLs.    Baseline  10/23: will give HEP next session    Time  2    Period  Weeks    Status  New    Target Date  07/17/18      PT SHORT TERM GOAL #2   Title  Patient will report pain score less than 4/10 on VAS to demonstrate reduction of pain levels with functional tasks.     Baseline  10/23: 6/10    Time  2    Period  Weeks    Status  New    Target Date  07/17/18        PT Long Term Goals - 07/03/18 1700      PT LONG TERM GOAL #1   Title  Patient will report pain score of <3/10 on VAS for decreased pain levels and ease with functional activities.    Baseline  10/23: 6/10    Time  4    Period  Weeks    Status  New    Target Date  07/31/18      PT LONG TERM GOAL #2   Title  Patient will be able to ambulate on treadmill for 22minutes without increase in back/leg pain demonstrating improved community ambulation and treadmill walking at pulmonary rehab    Baseline  10/23: 92minutes increases pain    Time  4    Period  Weeks     Status  New    Target Date  07/31/18  PT LONG TERM GOAL #3   Title  Patient will have decreased modified oswestry score <50% for improved ability to complete household tasks with less pain levels.     Baseline  10/23: 62%    Time  4    Period  Weeks    Status  New    Target Date  07/31/18      PT LONG TERM GOAL #4   Title  Patient will score 4+/5 on BLE MMT to demonstrate improved glute strength and ability to carry groceries with increased ease up/down stairs at home.    Baseline  10/23: 4-/5 grossly    Time  4    Period  Weeks    Status  New    Target Date  07/31/18            Plan - 07/18/18 1214    Clinical Impression Statement  Patient arrives with increased pain levels and antalgic gait pattern. Completed manual therapy with no reproduction of pain following treatment. Continued core activation and strengthening exercises to improve lumbar stabilization during activities.Patient demonstrates improved motor pattern and control of posterior pelvic tilts demonstrating improved core activation. Patient had no pain in back and less reported pain in legs with centralization at end of session. Patient will continue to benefit from skilled physical therapy to improved strength, reduce pain levels with ADLs, and improved quality of life.     Rehab Potential  Fair    Clinical Impairments Affecting Rehab Potential  (+) motivation (-) chronicity of symptoms    PT Frequency  2x / week    PT Duration  4 weeks    PT Treatment/Interventions  ADLs/Self Care Home Management;Cryotherapy;Moist Heat;Traction;Iontophoresis 4mg /ml Dexamethasone;Ultrasound;Gait training;Stair training;Functional mobility training;Neuromuscular re-education;Balance training;Therapeutic exercise;Therapeutic activities;Patient/family education;Manual techniques;Energy conservation;Aquatic Therapy;Electrical Stimulation;Passive range of motion;Dry needling;Taping    PT Next Visit Plan  nerve glides, strengthening     PT Home Exercise Plan  see sheet    Consulted and Agree with Plan of Care  Patient       Patient will benefit from skilled therapeutic intervention in order to improve the following deficits and impairments:  Abnormal gait, Cardiopulmonary status limiting activity, Decreased activity tolerance, Decreased balance, Decreased endurance, Decreased range of motion, Difficulty walking, Decreased strength, Hypomobility, Decreased mobility, Impaired perceived functional ability, Impaired flexibility, Postural dysfunction, Obesity, Improper body mechanics, Pain  Visit Diagnosis: Chronic low back pain, unspecified back pain laterality, unspecified whether sciatica present  Abnormal posture  Muscle weakness (generalized)  Other abnormalities of gait and mobility     Problem List There are no active problems to display for this patient.  Erick Blinks, SPT  This entire session was performed under direct supervision and direction of a licensed therapist/therapist assistant . I have personally read, edited and approve of the note as written.  Janna Arch, PT, DPT   07/18/2018, 12:58 PM  [place] MAIN [facility] SERVICES [address] [place], [place], [address] Phone: [phone]   Fax:  [phone]  Name: Quynn Vilchis MRN: [id number] Date of Birth: 23-Jan-1951

## 2018-07-19 DIAGNOSIS — R0602 Shortness of breath: Secondary | ICD-10-CM

## 2018-07-19 NOTE — Progress Notes (Signed)
Daily Session Note  Patient Details  Name: Susan Callahan MRN: [id number] Date of Birth: [birth year] Referring Provider:     Pulmonary Rehab from 04/08/2018 in [facility] Cardiac and Pulmonary Rehab  Referring Provider  Jamesetta So MD      Encounter Date: 07/19/2018  Check In: Session Check In - 07/19/18 0958      Check-In   Supervising physician immediately available to respond to emergencies  [facility] immediately available ER MD    Physician(s)  Dr. Cinda Quest and Corky Downs    Location  [facility]-Cardiac & Pulmonary Rehab    Staff Present  Renita Papa, RN Vickki Hearing, BA, ACSM CEP, Exercise Physiologist;Tinia Oravec Tessie Fass RCP,RRT,BSRT    Medication changes reported      No    Fall or balance concerns reported     No    Warm-up and Cool-down  Performed as group-led instruction    Resistance Training Performed  Yes    VAD Patient?  No    PAD/SET Patient?  No      Pain Assessment   Currently in Pain?  No/denies          Social History   Tobacco Use  Smoking Status Former Smoker  . Packs/day: 3.00  . Years: 27.00  . Pack years: 81.00  . Types: Cigarettes  . Last attempt to quit: 03/15/1991  . Years since quitting: 27.3  Smokeless Tobacco Never Used    Goals Met:  Independence with exercise equipment Exercise tolerated well No report of cardiac concerns or symptoms Strength training completed today  Goals Unmet:  Not Applicable  Comments:  Arwa graduated today from  rehab with 36 sessions completed.  Details of the patient's exercise prescription and what She needs to do in order to continue the prescription and progress were discussed with patient.  Patient was given a copy of prescription and goals.  Patient verbalized understanding.  Magalie plans to continue to exercise by joining [employer]. Pt able to follow exercise prescription today without complaint.  Will continue to monitor for progression.    Dr. Emily Filbert is Medical Director for [facility] and [facility].

## 2018-07-19 NOTE — Patient Instructions (Signed)
Discharge Patient Instructions  Patient Details  Name: Susan Callahan MRN: [id number] Date of Birth: 1951/01/30 Referring Provider:  Konrad Saha, MD   Number of Visits: 36/36  Reason for Discharge:  Patient reached a stable level of exercise. Patient independent in their exercise. Patient has met program and personal goals.  Smoking History:  Social History   Tobacco Use  Smoking Status Former Smoker  . Packs/day: 3.00  . Years: 27.00  . Pack years: 81.00  . Types: Cigarettes  . Last attempt to quit: 03/15/1991  . Years since quitting: 27.3  Smokeless Tobacco Never Used    Diagnosis:  Shortness of breath  Initial Exercise Prescription: Initial Exercise Prescription - 04/08/18 1400      Date of Initial Exercise RX and Referring Provider   Date  04/08/18    Referring Provider  Jamesetta So MD      Treadmill   MPH  2.5    Grade  1    Minutes  15    METs  3.26      Recumbant Bike   Level  2    RPM  50    Watts  38    Minutes  15    METs  3.2      NuStep   Level  3    SPM  80    Minutes  15    METs  3      Prescription Details   Frequency (times per week)  3    Duration  Progress to 45 minutes of aerobic exercise without signs/symptoms of physical distress      Intensity   THRR 40-80% of Max Heartrate  102-136    Ratings of Perceived Exertion  11-13    Perceived Dyspnea  0-4      Progression   Progression  Continue to progress workloads to maintain intensity without signs/symptoms of physical distress.      Resistance Training   Training Prescription  Yes    Weight  3 lbs    Reps  10-15       Discharge Exercise Prescription (Final Exercise Prescription Changes): Exercise Prescription Changes - 07/17/18 1300      Response to Exercise   Blood Pressure (Admit)  138/72    Blood Pressure (Exit)  120/80    Heart Rate (Admit)  77 bpm    Heart Rate (Exercise)  129 bpm    Heart Rate (Exit)  86 bpm    Oxygen Saturation (Admit)  93 %     Oxygen Saturation (Exercise)  92 %    Oxygen Saturation (Exit)  93 %    Rating of Perceived Exertion (Exercise)  13    Perceived Dyspnea (Exercise)  3    Duration  Continue with 45 min of aerobic exercise without signs/symptoms of physical distress.    Intensity  Other (comment)   reaches RPE      Progression   Progression  Continue to progress workloads to maintain intensity without signs/symptoms of physical distress.    Average METs  3.35      Resistance Training   Training Prescription  Yes    Weight  4 lb    Reps  10-15      Interval Training   Interval Training  No      Recumbant Bike   Level  5    RPM  60    Watts  35    Minutes  15    METs  3.23      NuStep   Level  6    SPM  80    Minutes  15    METs  3.2      Home Exercise Plan   Plans to continue exercise at  [facility]    Frequency  Add 1 additional day to program exercise sessions.    Initial Home Exercises Provided  04/24/18       Functional Capacity: 6 Minute Walk    Row Name 04/08/18 1429 07/15/18 1057       6 Minute Walk   Phase  Initial  Discharge    Distance  1475 feet  1475 feet    Distance % Change  -  0 %    Distance Feet Change  -  0 ft    Walk Time  6 minutes  6 minutes    # of Rest Breaks  0  0    MPH  2.79  2.79    METS  3.25  3.63    RPE  13  13    Perceived Dyspnea   3  3    VO2 Peak  11.39  12.69    Symptoms  Yes (comment)  No    Comments  SOB  -    Resting HR  68 bpm  86 bpm    Resting BP  144/72  138/72    Resting Oxygen Saturation   95 %  96 %    Exercise Oxygen Saturation  during 6 min walk  87 %  87 %    Max Ex. HR  112 bpm  129 bpm    Max Ex. BP  154/86  180/80    2 Minute Post BP  146/80  140/82      Interval HR   1 Minute HR  83  104    2 Minute HR  108  122    3 Minute HR  106  124    4 Minute HR  111  -    5 Minute HR  114  -    6 Minute HR  112  129    2 Minute Post HR  81  101    Interval Heart Rate?  Yes  Yes      Interval Oxygen   Interval  Oxygen?  Yes  Yes    Baseline Oxygen Saturation %  95 %  96 %    1 Minute Oxygen Saturation %  91 %  93 %    1 Minute Liters of Oxygen  0 L Room Air  0 L    2 Minute Oxygen Saturation %  91 %  88 %    2 Minute Liters of Oxygen  0 L  0 L    3 Minute Oxygen Saturation %  87 %  88 %    3 Minute Liters of Oxygen  0 L  0 L    4 Minute Oxygen Saturation %  93 %  - no reading (battery died)    4 Minute Liters of Oxygen  0 L  0 L    5 Minute Oxygen Saturation %  93 %  - no reading (battery died)    5 Minute Liters of Oxygen  0 L  0 L    6 Minute Oxygen Saturation %  94 %  87 %    6 Minute Liters of Oxygen  0 L  0 L    2 Minute  Post Oxygen Saturation %  96 %  93 %    2 Minute Post Liters of Oxygen  0 L  -       Quality of Life:   Personal Goals: Goals established at orientation with interventions provided to work toward goal. Personal Goals and Risk Factors at Admission - 04/08/18 1432      Core Components/Risk Factors/Patient Goals on Admission    Weight Management  Yes;Weight Loss;Obesity    Intervention  Weight Management: Develop a combined nutrition and exercise program designed to reach desired caloric intake, while maintaining appropriate intake of nutrient and fiber, sodium and fats, and appropriate energy expenditure required for the weight goal.;Weight Management: Provide education and appropriate resources to help participant work on and attain dietary goals.;Weight Management/Obesity: Establish reasonable short term and long term weight goals.;Obesity: Provide education and appropriate resources to help participant work on and attain dietary goals.    Admit Weight  210 lb 12.8 oz (95.6 kg)    Goal Weight: Short Term  205 lb (93 kg)    Goal Weight: Long Term  150 lb (68 kg)    Expected Outcomes  Short Term: Continue to assess and modify interventions until short term weight is achieved;Long Term: Adherence to nutrition and physical activity/exercise program aimed toward attainment  of established weight goal;Weight Maintenance: Understanding of the daily nutrition guidelines, which includes 25-35% calories from fat, 7% or less cal from saturated fats, less than 295m cholesterol, less than 1.5gm of sodium, & 5 or more servings of fruits and vegetables daily;Weight Loss: Understanding of general recommendations for a balanced deficit meal plan, which promotes 1-2 lb weight loss per week and includes a negative energy balance of [id number] kcal/d;Understanding recommendations for meals to include 15-35% energy as protein, 25-35% energy from fat, 35-60% energy from carbohydrates, less than 2026mof dietary cholesterol, 20-35 gm of total fiber daily;Understanding of distribution of calorie intake throughout the day with the consumption of 4-5 meals/snacks    Improve shortness of breath with ADL's  Yes    Intervention  Provide education, individualized exercise plan and daily activity instruction to help decrease symptoms of SOB with activities of daily living.    Expected Outcomes  Short Term: Improve cardiorespiratory fitness to achieve a reduction of symptoms when performing ADLs;Long Term: Be able to perform more ADLs without symptoms or delay the onset of symptoms    Diabetes  Yes    Intervention  Provide education about signs/symptoms and action to take for hypo/hyperglycemia.;Provide education about proper nutrition, including hydration, and aerobic/resistive exercise prescription along with prescribed medications to achieve blood glucose in normal ranges: Fasting glucose 65-99 mg/dL    Expected Outcomes  Short Term: Participant verbalizes understanding of the signs/symptoms and immediate care of hyper/hypoglycemia, proper foot care and importance of medication, aerobic/resistive exercise and nutrition plan for blood glucose control.;Long Term: Attainment of HbA1C < 7%.    Lipids  Yes    Intervention  Provide education and support for participant on nutrition & aerobic/resistive  exercise along with prescribed medications to achieve LDL <7067mHDL >36m47m  Expected Outcomes  Short Term: Participant states understanding of desired cholesterol values and is compliant with medications prescribed. Participant is following exercise prescription and nutrition guidelines.;Long Term: Cholesterol controlled with medications as prescribed, with individualized exercise RX and with personalized nutrition plan. Value goals: LDL < 70mg6mL > 40 mg.        Personal Goals Discharge: Goals and Risk Factor Review - 06/05/18  1548      Core Components/Risk Factors/Patient Goals Review   Personal Goals Review  Weight Management/Obesity;Improve shortness of breath with ADL's;Diabetes;Lipids    Review  Patient states she has been able to do more of the yardwork not that the weather has got cooler. She is doing well in the program and wants to lose more weight. It is hard for her to walk alot due to her back pain. She is going to see a doctor for her pain to try to manage it better.     Expected Outcomes  Short: lose more weight to improve shortness of breath. Long: maintain weight loss to help with shortness of breath.       Exercise Goals and Review: Exercise Goals    Row Name 04/08/18 1435             Exercise Goals   Increase Physical Activity  Yes       Intervention  Provide advice, education, support and counseling about physical activity/exercise needs.;Develop an individualized exercise prescription for aerobic and resistive training based on initial evaluation findings, risk stratification, comorbidities and participant's personal goals.       Expected Outcomes  Short Term: Attend rehab on a regular basis to increase amount of physical activity.;Long Term: Add in home exercise to make exercise part of routine and to increase amount of physical activity.;Long Term: Exercising regularly at least 3-5 days a week.       Increase Strength and Stamina  Yes       Intervention   Provide advice, education, support and counseling about physical activity/exercise needs.;Develop an individualized exercise prescription for aerobic and resistive training based on initial evaluation findings, risk stratification, comorbidities and participant's personal goals.       Expected Outcomes  Short Term: Increase workloads from initial exercise prescription for resistance, speed, and METs.;Short Term: Perform resistance training exercises routinely during rehab and add in resistance training at home;Long Term: Improve cardiorespiratory fitness, muscular endurance and strength as measured by increased METs and functional capacity (6MWT)       Able to understand and use rate of perceived exertion (RPE) scale  Yes       Intervention  Provide education and explanation on how to use RPE scale       Expected Outcomes  Short Term: Able to use RPE daily in rehab to express subjective intensity level;Long Term:  Able to use RPE to guide intensity level when exercising independently       Able to understand and use Dyspnea scale  Yes       Intervention  Provide education and explanation on how to use Dyspnea scale       Expected Outcomes  Short Term: Able to use Dyspnea scale daily in rehab to express subjective sense of shortness of breath during exertion;Long Term: Able to use Dyspnea scale to guide intensity level when exercising independently       Knowledge and understanding of Target Heart Rate Range (THRR)  Yes       Intervention  Provide education and explanation of THRR including how the numbers were predicted and where they are located for reference       Expected Outcomes  Short Term: Able to state/look up THRR;Short Term: Able to use daily as guideline for intensity in rehab;Long Term: Able to use THRR to govern intensity when exercising independently       Able to check pulse independently  Yes  Intervention  Provide education and demonstration on how to check pulse in carotid and  radial arteries.;Review the importance of being able to check your own pulse for safety during independent exercise       Expected Outcomes  Short Term: Able to explain why pulse checking is important during independent exercise;Long Term: Able to check pulse independently and accurately       Understanding of Exercise Prescription  Yes       Intervention  Provide education, explanation, and written materials on patient's individual exercise prescription       Expected Outcomes  Short Term: Able to explain program exercise prescription;Long Term: Able to explain home exercise prescription to exercise independently          Exercise Goals Re-Evaluation: Exercise Goals Re-Evaluation    Row Name 04/15/18 1023 04/24/18 1143 04/24/18 1248 05/08/18 1220 05/23/18 0812     Exercise Goal Re-Evaluation   Exercise Goals Review  Increase Physical Activity;Increase Strength and Stamina;Able to understand and use rate of perceived exertion (RPE) scale;Knowledge and understanding of Target Heart Rate Range (THRR);Able to understand and use Dyspnea scale;Understanding of Exercise Prescription  Increase Physical Activity;Increase Strength and Stamina;Able to understand and use rate of perceived exertion (RPE) scale;Able to understand and use Dyspnea scale;Knowledge and understanding of Target Heart Rate Range (THRR);Able to check pulse independently  Increase Physical Activity;Increase Strength and Stamina;Able to understand and use rate of perceived exertion (RPE) scale;Able to understand and use Dyspnea scale;Knowledge and understanding of Target Heart Rate Range (THRR)  Increase Physical Activity;Increase Strength and Stamina;Able to understand and use rate of perceived exertion (RPE) scale;Able to understand and use Dyspnea scale  Increase Physical Activity;Increase Strength and Stamina;Able to understand and use rate of perceived exertion (RPE) scale;Able to understand and use Dyspnea scale   Comments  Reviewed  RPE scale, THR and program prescription with pt today.  Pt voiced understanding and was given a copy of goals to take home.   Reviewed home exercise with pt today.  Pt plans to walk and consider Bernie for exercise.  Reviewed THR, pulse, RPE, sign and symptoms, NTG use, and when to call 911 or MD.  Also discussed weather considerations and indoor options.  Pt voiced understanding.  -  Fujiko has increased level on NS and overall MET level.  She has increased to 4 lb for strength work.  Staff will monitor progress.  Latunya is meeting RPE goals and has increased MET level slightly.  Staff will encourage increasing level on bike.   Expected Outcomes  Short: Use RPE daily to regulate intensity. Long: Follow program prescription in THR.  Short - add one day of walking in addition to program sessions and research options like FF Long - maintain exercise on her own  -  Short - continue to attend consistently Long - improve MET level  Short - attend consistently Long - improve MET level   Row Name 06/05/18 1243 06/19/18 1157 07/03/18 1307 07/03/18 1308       Exercise Goal Re-Evaluation   Exercise Goals Review  Increase Physical Activity;Increase Strength and Stamina;Able to understand and use rate of perceived exertion (RPE) scale;Able to understand and use Dyspnea scale  Increase Physical Activity;Increase Strength and Stamina;Able to understand and use rate of perceived exertion (RPE) scale;Able to understand and use Dyspnea scale;Understanding of Exercise Prescription  Increase Physical Activity;Increase Strength and Stamina;Able to understand and use rate of perceived exertion (RPE) scale;Able to understand and use Dyspnea scale;Knowledge and understanding  of Target Heart Rate Range (THRR);Understanding of Exercise Prescription  Increase Physical Activity;Increase Strength and Stamina;Able to understand and use rate of perceived exertion (RPE) scale;Able to understand and use Dyspnea scale;Knowledge and  understanding of Target Heart Rate Range (THRR);Understanding of Exercise Prescription    Comments  Gesenia has increased overall MET level.  She plans to continue exercise in [employer].  Debany continues to increase overall MET level.  She tolerates exercise well.  Nikolina Simerson has made steady improvement in MET level since beginning LW.  She has 6 sessions left and plans to go to [employer] to continue exercise.    Expected Outcomes  Short - continue to progress workloads as tolerated Long - increase overall MET level  Short - progress levels on bike and TM Long - increase overall METs  -  Short - complete LW program Long - maintain exercise on her own       Nutrition & Weight - Outcomes: Pre Biometrics - 04/08/18 1435      Pre Biometrics   Height  5' 5.1" (1.654 m)    Weight  210 lb 12.8 oz (95.6 kg)    Waist Circumference  43 inches    Hip Circumference  44 inches    Waist to Hip Ratio  0.98 %    BMI (Calculated)  34.95    Single Leg Stand  0.72 seconds      Post Biometrics - 07/15/18 1101       Post  Biometrics   Height  5' 5.1" (1.654 m)    Weight  216 lb (98 kg)    Waist Circumference  45 inches    Hip Circumference  44.5 inches    Waist to Hip Ratio  1.01 %    BMI (Calculated)  35.81       Nutrition: Nutrition Therapy & Goals - 04/17/18 1414      Nutrition Therapy   Diet  TLC    Drug/Food Interactions  Statins/Certain Fruits   reports to consume grapefruit with cottage cheese occasionally   Protein (specify units)  10oz    Fiber  25 grams    Whole Grain Foods  2 servings   3 ideal, not regularly consuming whole grain options   Saturated Fats  12 max. grams    Fruits and Vegetables  5 servings/day   8 ideal. eats 2 meals/day   Sodium  2000 grams      Personal Nutrition Goals   Nutrition Goal  When choosing frozen meals, choose options with no more than 650m of sodium per meal    Personal Goal #2  Opt for frozen over canned vegetables when possible     Personal Goal #3  Eat breakfast on a more consistent schedule, particularly on the days when you are planning to exercise    Comments  She is on a tight budget but is seeking to eat healthier. Does not follow a diabetic diet and consumes a diet high in frozen / pre-prepared foods       Intervention Plan   Intervention  Prescribe, educate and counsel regarding individualized specific dietary modifications aiming towards targeted core components such as weight, hypertension, lipid management, diabetes, heart failure and other comorbidities.;Nutrition handout(s) given to patient.   Spice it up salt-free seasoning recipe sheet, Low sodium nutrition therapy   Expected Outcomes  Short Term Goal: Understand basic principles of dietary content, such as calories, fat, sodium, cholesterol and nutrients.;Short Term Goal: A plan has  been developed with personal nutrition goals set during dietitian appointment.       Nutrition Discharge: Nutrition Assessments - 04/08/18 1359      MEDFICTS Scores   Pre Score  28       Education Questionnaire Score: Knowledge Questionnaire Score - 04/08/18 1359      Knowledge Questionnaire Score   Pre Score  15/18   reviewed with patient      Goals reviewed with patient; copy given to patient.

## 2018-07-19 NOTE — Progress Notes (Signed)
Pulmonary Individual Treatment Plan  Patient Details  Name: Susan Callahan MRN: [id number] Date of Birth: 01-01-1951 Referring Provider:     Pulmonary Rehab from 04/08/2018 in [facility] Cardiac and Pulmonary Rehab  Referring Provider  Jamesetta So MD      Initial Encounter Date:    Pulmonary Rehab from 04/08/2018 in [facility] Cardiac and Pulmonary Rehab  Date  04/08/18      Visit Diagnosis: Shortness of breath  Patient's Home Medications on Admission:  Current Outpatient Medications:  .  acetaZOLAMIDE (DIAMOX) 250 MG tablet, Take by mouth., Disp: , Rfl:  .  albuterol (PROVENTIL HFA;VENTOLIN HFA) 108 (90 Base) MCG/ACT inhaler, Inhale into the lungs., Disp: , Rfl:  .  allopurinol (ZYLOPRIM) 300 MG tablet, Take by mouth., Disp: , Rfl:  .  amitriptyline (ELAVIL) 10 MG tablet, Take by mouth., Disp: , Rfl:  .  aspirin EC 81 MG tablet, Take by mouth., Disp: , Rfl:  .  atorvastatin (LIPITOR) 20 MG tablet, Take by mouth., Disp: , Rfl:  .  b complex vitamins capsule, Take by mouth., Disp: , Rfl:  .  Calcium Carbonate-Vitamin D 600-400 MG-UNIT tablet, Take by mouth., Disp: , Rfl:  .  Coenzyme Q10 100 MG capsule, Take by mouth., Disp: , Rfl:  .  furosemide (LASIX) 40 MG tablet, Take by mouth., Disp: , Rfl:  .  gabapentin (NEURONTIN) 600 MG tablet, Take by mouth., Disp: , Rfl:  .  magnesium oxide (MAG-OX) 400 MG tablet, Take by mouth., Disp: , Rfl:  .  metFORMIN (GLUCOPHAGE-XR) 500 MG 24 hr tablet, Take by mouth., Disp: , Rfl:  .  Multiple Vitamin (MULTIVITAMIN) capsule, Take by mouth., Disp: , Rfl:  .  naproxen (EC NAPROSYN) 500 MG EC tablet, Take one pill bid for one weeks, then prn inflammation, Disp: , Rfl:  .  Omega-3 Fatty Acids ([employer] FISH OIL) 1200 MG CAPS, Take by mouth., Disp: , Rfl:  .  spironolactone (ALDACTONE) 100 MG tablet, Take 100 mg by mouth daily., Disp: , Rfl: 2 .  umeclidinium bromide (INCRUSE ELLIPTA) 62.5 MCG/INH AEPB, Inhale into the lungs., Disp: , Rfl:  .  verapamil  (CALAN-SR) 240 MG CR tablet, Take by mouth., Disp: , Rfl:   Past Medical History: Past Medical History:  Diagnosis Date  . Diabetes mellitus without complication ([place])   . Hypertension   . Migraine   . 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 Packs/day: 3.00  . Years: 27.00  . Pack years: 81.00  . Types: Cigarettes  . Last attempt to quit: 03/15/1991  . Years since quitting: 27.3  Smokeless Tobacco Never Used    Labs: Recent Review Flowsheet Data    There is no flowsheet data to display.       Pulmonary Assessment Scores: Pulmonary Assessment Scores    Row Name 04/08/18 1359 07/19/18 1049       ADL UCSD   ADL Phase  Entry  Exit    SOB Score total  37  18    Rest  1  0    Walk  1  1    Stairs  4  4    Bath  0  0    Dress  0  0    Shop  2  1      CAT Score   CAT Score  19  13      mMRC Score  mMRC Score  1  1       Pulmonary Function Assessment: Pulmonary Function Assessment - 04/08/18 1402      Initial Spirometry Results   FVC%  99.5 %    FEV1%  80.5 %    FEV1/FVC Ratio  68    Comments  Test results from [facility] 04/03/18      Post Bronchodilator Spirometry Results   FVC%  99 %    FEV1%  85.5 %    FEV1/FVC Ratio  70    Comments  Test results from [facility] 04/03/18      Breath   Bilateral Breath Sounds  Clear    Shortness of Breath  Yes;Limiting activity       Exercise Target Goals: Exercise Program Goal: Individual exercise prescription set using results from initial 6 min walk test and THRR while considering  patient's activity barriers and safety.   Exercise Prescription Goal: Initial exercise prescription builds to 30-45 minutes a day of aerobic activity, 2-3 days per week.  Home exercise guidelines will be given to patient during program as part of exercise prescription that the participant will acknowledge.  Activity Barriers & Risk  Stratification: Activity Barriers & Cardiac Risk Stratification - 04/08/18 1432      Activity Barriers & Cardiac Risk Stratification   Activity Barriers  Balance Concerns;Muscular Weakness;Deconditioning;Shortness of Breath;Back Problems       6 Minute Walk: 6 Minute Walk    Row Name 04/08/18 1429 07/15/18 1057       6 Minute Walk   Phase  Initial  Discharge    Distance  1475 feet  1475 feet    Distance % Change  -  0 %    Distance Feet Change  -  0 ft    Walk Time  6 minutes  6 minutes    # of Rest Breaks  0  0    MPH  2.79  2.79    METS  3.25  3.63    RPE  13  13    Perceived Dyspnea   3  3    VO2 Peak  11.39  12.69    Symptoms  Yes (comment)  No    Comments  SOB  -    Resting HR  68 bpm  86 bpm    Resting BP  144/72  138/72    Resting Oxygen Saturation   95 %  96 %    Exercise Oxygen Saturation  during 6 min walk  87 %  87 %    Max Ex. HR  112 bpm  129 bpm    Max Ex. BP  154/86  180/80    2 Minute Post BP  146/80  140/82      Interval HR   1 Minute HR  83  104    2 Minute HR  108  122    3 Minute HR  106  124    4 Minute HR  111  -    5 Minute HR  114  -    6 Minute HR  112  129    2 Minute Post HR  81  101    Interval Heart Rate?  Yes  Yes      Interval Oxygen   Interval Oxygen?  Yes  Yes    Baseline Oxygen Saturation %  95 %  96 %    1 Minute Oxygen Saturation %  91 %  93 %  1 Minute Liters of Oxygen  0 L Room Air  0 L    2 Minute Oxygen Saturation %  91 %  88 %    2 Minute Liters of Oxygen  0 L  0 L    3 Minute Oxygen Saturation %  87 %  88 %    3 Minute Liters of Oxygen  0 L  0 L    4 Minute Oxygen Saturation %  93 %  - no reading (battery died)    4 Minute Liters of Oxygen  0 L  0 L    5 Minute Oxygen Saturation %  93 %  - no reading (battery died)    5 Minute Liters of Oxygen  0 L  0 L    6 Minute Oxygen Saturation %  94 %  87 %    6 Minute Liters of Oxygen  0 L  0 L    2 Minute Post Oxygen Saturation %  96 %  93 %    2 Minute Post Liters of  Oxygen  0 L  -      Oxygen Initial Assessment: Oxygen Initial Assessment - 04/08/18 1430      Home Oxygen   Home Oxygen Device  None    Sleep Oxygen Prescription  None    Home Exercise Oxygen Prescription  None    Home at Rest Exercise Oxygen Prescription  None      Initial 6 min Walk   Oxygen Used  None      Program Oxygen Prescription   Program Oxygen Prescription  None      Intervention   Short Term Goals  To learn and understand importance of maintaining oxygen saturations>88%;To learn and demonstrate proper pursed lip breathing techniques or other breathing techniques.;To learn and demonstrate proper use of respiratory medications;To learn and understand importance of monitoring SPO2 with pulse oximeter and demonstrate accurate use of the pulse oximeter.   Her inhaler was 300 dollars and cannot afford her inhaler.   Long  Term Goals  Verbalizes importance of monitoring SPO2 with pulse oximeter and return demonstration;Maintenance of O2 saturations>88%;Exhibits proper breathing techniques, such as pursed lip breathing or other method taught during program session;Demonstrates proper use of MDI's;Compliance with respiratory medication       Oxygen Re-Evaluation: Oxygen Re-Evaluation    Row Name 04/15/18 1039 05/15/18 1459 06/05/18 1540 07/03/18 1139       Program Oxygen Prescription   Program Oxygen Prescription  None  None  None  None      Home Oxygen   Home Oxygen Device  None  None  None  None    Sleep Oxygen Prescription  None  None  None  None    Home Exercise Oxygen Prescription  None  None  None  None    Home at Rest Exercise Oxygen Prescription  None  None  None  None      Goals/Expected Outcomes   Short Term Goals  To learn and understand importance of maintaining oxygen saturations>88%;To learn and demonstrate proper pursed lip breathing techniques or other breathing techniques.;To learn and demonstrate proper use of respiratory medications;To learn and  understand importance of monitoring SPO2 with pulse oximeter and demonstrate accurate use of the pulse oximeter.  To learn and understand importance of maintaining oxygen saturations>88%;To learn and demonstrate proper pursed lip breathing techniques or other breathing techniques.;To learn and demonstrate proper use of respiratory medications;To learn and understand importance of monitoring SPO2 with pulse  oximeter and demonstrate accurate use of the pulse oximeter.  To learn and understand importance of maintaining oxygen saturations>88%;To learn and demonstrate proper pursed lip breathing techniques or other breathing techniques.;To learn and demonstrate proper use of respiratory medications;To learn and understand importance of monitoring SPO2 with pulse oximeter and demonstrate accurate use of the pulse oximeter.  To learn and understand importance of maintaining oxygen saturations>88%;To learn and demonstrate proper pursed lip breathing techniques or other breathing techniques.;To learn and demonstrate proper use of respiratory medications;To learn and understand importance of monitoring SPO2 with pulse oximeter and demonstrate accurate use of the pulse oximeter.    Long  Term Goals  Verbalizes importance of monitoring SPO2 with pulse oximeter and return demonstration;Maintenance of O2 saturations>88%;Exhibits proper breathing techniques, such as pursed lip breathing or other method taught during program session;Demonstrates proper use of MDI's;Compliance with respiratory medication  Verbalizes importance of monitoring SPO2 with pulse oximeter and return demonstration;Maintenance of O2 saturations>88%;Exhibits proper breathing techniques, such as pursed lip breathing or other method taught during program session;Demonstrates proper use of MDI's;Compliance with respiratory medication  Verbalizes importance of monitoring SPO2 with pulse oximeter and return demonstration;Maintenance of O2  saturations>88%;Exhibits proper breathing techniques, such as pursed lip breathing or other method taught during program session;Demonstrates proper use of MDI's;Compliance with respiratory medication  Verbalizes importance of monitoring SPO2 with pulse oximeter and return demonstration;Maintenance of O2 saturations>88%;Exhibits proper breathing techniques, such as pursed lip breathing or other method taught during program session;Demonstrates proper use of MDI's;Compliance with respiratory medication    Comments  Reviewed PLB technique with pt.  Talked about how it work and it's important to maintaining his exercise saturations.    Patient states that she does not have a pulse oximeter at home. She also need to work on PLB more when she gets short of breath. She states she forgets to do PLB when she is at home.  Susan Callahan has been doing well in the program and her oxygen seems to be in the low to mid 90's. She uses PLB at home when she is carrying something. She does not have a pulse oximeter at home to check her HR and oxygen. Informed her on where to get one and to check when she is exerting herself at home.  Patient was asking about her congestion and feels like she cannot get the phlem out of her chest. Informed patient about doing some breathing techniques such as a huff cough or using a PEP device. Gave patient a PEP device and advised her how to use it.    Goals/Expected Outcomes  Short: Become more profiecient at using PLB.   Long: Become independent at using PLB.  Short: monitor oxygen at home with exertion when she has a pulse oximeter. Long: maintain oxygen saturations above 88 percent independently.  Short: monitor oxygen at home with exertion. Long: maintain oxygen saturations above 88 percent independently.  Short: use PEP device to help with secretions. Long: maintain secretion clearance techniques independently       Oxygen Discharge (Final Oxygen Re-Evaluation): Oxygen Re-Evaluation - 07/03/18  1139      Program Oxygen Prescription   Program Oxygen Prescription  None      Home Oxygen   Home Oxygen Device  None    Sleep Oxygen Prescription  None    Home Exercise Oxygen Prescription  None    Home at Rest Exercise Oxygen Prescription  None      Goals/Expected Outcomes   Short Term Goals  To learn  and understand importance of maintaining oxygen saturations>88%;To learn and demonstrate proper pursed lip breathing techniques or other breathing techniques.;To learn and demonstrate proper use of respiratory medications;To learn and understand importance of monitoring SPO2 with pulse oximeter and demonstrate accurate use of the pulse oximeter.    Long  Term Goals  Verbalizes importance of monitoring SPO2 with pulse oximeter and return demonstration;Maintenance of O2 saturations>88%;Exhibits proper breathing techniques, such as pursed lip breathing or other method taught during program session;Demonstrates proper use of MDI's;Compliance with respiratory medication    Comments  Patient was asking about her congestion and feels like she cannot get the phlem out of her chest. Informed patient about doing some breathing techniques such as a huff cough or using a PEP device. Gave patient a PEP device and advised her how to use it.    Goals/Expected Outcomes  Short: use PEP device to help with secretions. Long: maintain secretion clearance techniques independently       Initial Exercise Prescription: Initial Exercise Prescription - 04/08/18 1400      Date of Initial Exercise RX and Referring Provider   Date  04/08/18    Referring Provider  Jamesetta So MD      Treadmill   MPH  2.5    Grade  1    Minutes  15    METs  3.26      Recumbant Bike   Level  2    RPM  50    Watts  38    Minutes  15    METs  3.2      NuStep   Level  3    SPM  80    Minutes  15    METs  3      Prescription Details   Frequency (times per week)  3    Duration  Progress to 45 minutes of aerobic exercise  without signs/symptoms of physical distress      Intensity   THRR 40-80% of Max Heartrate  102-136    Ratings of Perceived Exertion  11-13    Perceived Dyspnea  0-4      Progression   Progression  Continue to progress workloads to maintain intensity without signs/symptoms of physical distress.      Resistance Training   Training Prescription  Yes    Weight  3 lbs    Reps  10-15       Perform Capillary Blood Glucose checks as needed.  Exercise Prescription Changes:  Exercise Prescription Changes    Row Name 04/08/18 1400 04/24/18 1200 05/08/18 1200 05/23/18 0800 06/05/18 1200     Response to Exercise   Blood Pressure (Admit)  144/72  114/68  128/63  126/72  146/60   Blood Pressure (Exercise)  154/86  -  -  -  -   Blood Pressure (Exit)  146/80  120/72  130/64  124/64  122/70   Heart Rate (Admit)  68 bpm  108 bpm  78 bpm  76 bpm  80 bpm   Heart Rate (Exercise)  114 bpm  76 bpm  88 bpm  98 bpm  103 bpm   Heart Rate (Exit)  81 bpm  69 bpm  67 bpm  78 bpm  76 bpm   Oxygen Saturation (Admit)  95 %  92 %  93 %  93 %  89 %   Oxygen Saturation (Exercise)  87 %  95 %  94 %  95 %  91 %   Oxygen  Saturation (Exit)  96 %  92 %  94 %  94 %  90 %   Rating of Perceived Exertion (Exercise)  13  13  13  13  12    Perceived Dyspnea (Exercise)  3  2  2  2  1    Symptoms  SOB  -  -  -  -   Comments  walk test results  -  -  -  -   Duration  -  Continue with 45 min of aerobic exercise without signs/symptoms of physical distress.  Continue with 45 min of aerobic exercise without signs/symptoms of physical distress.  Continue with 45 min of aerobic exercise without signs/symptoms of physical distress.  Continue with 45 min of aerobic exercise without signs/symptoms of physical distress.   Intensity  -  THRR unchanged  THRR unchanged  THRR unchanged  THRR unchanged     Progression   Progression  -  Continue to progress workloads to maintain intensity without signs/symptoms of physical distress.   Continue to progress workloads to maintain intensity without signs/symptoms of physical distress.  Continue to progress workloads to maintain intensity without signs/symptoms of physical distress.  Continue to progress workloads to maintain intensity without signs/symptoms of physical distress.   Average METs  -  2.63  2.8  2.98  3.25     Resistance Training   Training Prescription  -  Yes  Yes  Yes  Yes   Weight  -  3 lb  4 lb  4 lb  4 lb   Reps  -  10-15  10-15  10-15  10-15     Interval Training   Interval Training  -  No  No  No  No     Treadmill   MPH  -  2.5  2.5  2.5  2.5   Grade  -  1  1  1  1    Minutes  -  15  15  15  15    METs  -  3.26  3.26  3.26  3.26     Recumbant Bike   Level  -  -  -  2  2   RPM  -  -  -  60  60   Watts  -  -  -  23  29   Minutes  -  -  -  15  15   METs  -  -  -  2.7  3.25     NuStep   Level  -  3  5  -  -   SPM  -  80  80  -  -   Minutes  -  15  15  -  -   METs  -  2  2.2  -  -     Home Exercise Plan   Plans to continue exercise at  -  [place]   Frequency  -  Add 1 additional day to program exercise sessions.  Add 1 additional day to program exercise sessions.  Add 1 additional day to program exercise sessions.  Add 1 additional day to program exercise sessions.   Initial Home Exercises Provided  -  04/24/18  04/24/18  04/24/18  04/24/18   Row Name 06/19/18 1100 07/03/18 1300 07/17/18 1300         Response to Exercise   Blood Pressure (Admit)  148/84  128/62  138/72  Blood Pressure (Exit)  128/66  124/64  120/80     Heart Rate (Admit)  80 bpm  78 bpm  77 bpm     Heart Rate (Exercise)  96 bpm  96 bpm  129 bpm     Heart Rate (Exit)  80 bpm  71 bpm  86 bpm     Oxygen Saturation (Admit)  93 %  90 %  93 %     Oxygen Saturation (Exercise)  92 %  90 %  92 %     Oxygen Saturation (Exit)  93 %  88 %  93 %     Rating of Perceived Exertion (Exercise)  12  13  13      Perceived Dyspnea (Exercise)  1  3   3      Duration  Continue with 45 min of aerobic exercise without signs/symptoms of physical distress.  Continue with 45 min of aerobic exercise without signs/symptoms of physical distress.  Continue with 45 min of aerobic exercise without signs/symptoms of physical distress.     Intensity  Other (comment) reached RPE goal - HR stays low  Other (comment) reaches RPE goal  Other (comment) reaches RPE        Progression   Progression  Continue to progress workloads to maintain intensity without signs/symptoms of physical distress.  Continue to progress workloads to maintain intensity without signs/symptoms of physical distress.  Continue to progress workloads to maintain intensity without signs/symptoms of physical distress.     Average METs  3.33  3.25  3.35       Resistance Training   Training Prescription  Yes  Yes  Yes     Weight  4 lb  4 lb  4 lb     Reps  10-15  10-15  10-15       Interval Training   Interval Training  No  No  No       Treadmill   MPH  -  2.5  -     Grade  -  1  -     Minutes  -  15  -     METs  -  3.26  -       Recumbant Bike   Level  2  2  5      RPM  60  60  60     Watts  33  36  35     Minutes  15  15  15      METs  3.25  3.25  3.23       NuStep   Level  6  -  6     SPM  80  -  80     Minutes  15  -  15     METs  3.5  -  3.2       Home Exercise Plan   Plans to continue exercise at  [employer]     Frequency  Add 1 additional day to program exercise sessions.  Add 1 additional day to program exercise sessions.  Add 1 additional day to program exercise sessions.     Initial Home Exercises Provided  04/24/18  04/24/18  04/24/18        Exercise Comments:  Exercise Comments    Row Name 04/15/18 1023 04/24/18 1143 07/19/18 1014       Exercise Comments   First full day of exercise!  Patient was oriented to  gym and equipment including functions, settings, policies, and procedures.  Patient's individual exercise prescription and  treatment plan were reviewed.  All starting workloads were established based on the results of the 6 minute walk test done at initial orientation visit.  The plan for exercise progression was also introduced and progression will be customized based on patient's performance and goals.  Reviewed home exercise with pt today.  Pt plans to walk and consider [place] for exercise.  Reviewed THR, pulse, RPE, sign and symptoms, NTG use, and when to call 911 or MD.  Also discussed weather considerations and indoor options.  Pt voiced understanding.  Susan Callahan graduated today from  rehab with 36 sessions completed.  Details of the patient's exercise prescription and what She needs to do in order to continue the prescription and progress were discussed with patient.  Patient was given a copy of prescription and goals.  Patient verbalized understanding.  Unique plans to continue to exercise by joining [employer].        Exercise Goals and Review:  Exercise Goals    Row Name 04/08/18 1435             Exercise Goals   Increase Physical Activity  Yes       Intervention  Provide advice, education, support and counseling about physical activity/exercise needs.;Develop an individualized exercise prescription for aerobic and resistive training based on initial evaluation findings, risk stratification, comorbidities and participant's personal goals.       Expected Outcomes  Short Term: Attend rehab on a regular basis to increase amount of physical activity.;Long Term: Add in home exercise to make exercise part of routine and to increase amount of physical activity.;Long Term: Exercising regularly at least 3-5 days a week.       Increase Strength and Stamina  Yes       Intervention  Provide advice, education, support and counseling about physical activity/exercise needs.;Develop an individualized exercise prescription for aerobic and resistive training based on initial evaluation findings, risk stratification,  comorbidities and participant's personal goals.       Expected Outcomes  Short Term: Increase workloads from initial exercise prescription for resistance, speed, and METs.;Short Term: Perform resistance training exercises routinely during rehab and add in resistance training at home;Long Term: Improve cardiorespiratory fitness, muscular endurance and strength as measured by increased METs and functional capacity (6MWT)       Able to understand and use rate of perceived exertion (RPE) scale  Yes       Intervention  Provide education and explanation on how to use RPE scale       Expected Outcomes  Short Term: Able to use RPE daily in rehab to express subjective intensity level;Long Term:  Able to use RPE to guide intensity level when exercising independently       Able to understand and use Dyspnea scale  Yes       Intervention  Provide education and explanation on how to use Dyspnea scale       Expected Outcomes  Short Term: Able to use Dyspnea scale daily in rehab to express subjective sense of shortness of breath during exertion;Long Term: Able to use Dyspnea scale to guide intensity level when exercising independently       Knowledge and understanding of Target Heart Rate Range (THRR)  Yes       Intervention  Provide education and explanation of THRR including how the numbers were predicted and where they are located for reference  Expected Outcomes  Short Term: Able to state/look up THRR;Short Term: Able to use daily as guideline for intensity in rehab;Long Term: Able to use THRR to govern intensity when exercising independently       Able to check pulse independently  Yes       Intervention  Provide education and demonstration on how to check pulse in carotid and radial arteries.;Review the importance of being able to check your own pulse for safety during independent exercise       Expected Outcomes  Short Term: Able to explain why pulse checking is important during independent exercise;Long  Term: Able to check pulse independently and accurately       Understanding of Exercise Prescription  Yes       Intervention  Provide education, explanation, and written materials on patient's individual exercise prescription       Expected Outcomes  Short Term: Able to explain program exercise prescription;Long Term: Able to explain home exercise prescription to exercise independently          Exercise Goals Re-Evaluation : Exercise Goals Re-Evaluation    Row Name 04/15/18 1023 04/24/18 1143 04/24/18 1248 05/08/18 1220 05/23/18 0812     Exercise Goal Re-Evaluation   Exercise Goals Review  Increase Physical Activity;Increase Strength and Stamina;Able to understand and use rate of perceived exertion (RPE) scale;Knowledge and understanding of Target Heart Rate Range (THRR);Able to understand and use Dyspnea scale;Understanding of Exercise Prescription  Increase Physical Activity;Increase Strength and Stamina;Able to understand and use rate of perceived exertion (RPE) scale;Able to understand and use Dyspnea scale;Knowledge and understanding of Target Heart Rate Range (THRR);Able to check pulse independently  Increase Physical Activity;Increase Strength and Stamina;Able to understand and use rate of perceived exertion (RPE) scale;Able to understand and use Dyspnea scale;Knowledge and understanding of Target Heart Rate Range (THRR)  Increase Physical Activity;Increase Strength and Stamina;Able to understand and use rate of perceived exertion (RPE) scale;Able to understand and use Dyspnea scale  Increase Physical Activity;Increase Strength and Stamina;Able to understand and use rate of perceived exertion (RPE) scale;Able to understand and use Dyspnea scale   Comments  Reviewed RPE scale, THR and program prescription with pt today.  Pt voiced understanding and was given a copy of goals to take home.   Reviewed home exercise with pt today.  Pt plans to walk and consider [place] for exercise.  Reviewed THR,  pulse, RPE, sign and symptoms, NTG use, and when to call 911 or MD.  Also discussed weather considerations and indoor options.  Pt voiced understanding.  -  Susan Callahan has increased level on NS and overall MET level.  She has increased to 4 lb for strength work.  Staff will monitor progress.  Susan Callahan is meeting RPE goals and has increased MET level slightly.  Staff will encourage increasing level on bike.   Expected Outcomes  Short: Use RPE daily to regulate intensity. Long: Follow program prescription in THR.  Short - add one day of walking in addition to program sessions and research options like FF Long - maintain exercise on her own  -  Short - continue to attend consistently Long - improve MET level  Short - attend consistently Long - improve MET level   [place] Name 06/05/18 1243 06/19/18 1157 07/03/18 1307 07/03/18 1308       Exercise Goal Re-Evaluation   Exercise Goals Review  Increase Physical Activity;Increase Strength and Stamina;Able to understand and use rate of perceived exertion (RPE) scale;Able to understand and use Dyspnea  scale  Increase Physical Activity;Increase Strength and Stamina;Able to understand and use rate of perceived exertion (RPE) scale;Able to understand and use Dyspnea scale;Understanding of Exercise Prescription  Increase Physical Activity;Increase Strength and Stamina;Able to understand and use rate of perceived exertion (RPE) scale;Able to understand and use Dyspnea scale;Knowledge and understanding of Target Heart Rate Range (THRR);Understanding of Exercise Prescription  Increase Physical Activity;Increase Strength and Stamina;Able to understand and use rate of perceived exertion (RPE) scale;Able to understand and use Dyspnea scale;Knowledge and understanding of Target Heart Rate Range (THRR);Understanding of Exercise Prescription    Comments  Susan Callahan has increased overall MET level.  She plans to continue exercise in [employer].  Susan Callahan continues to increase overall MET level.  She  tolerates exercise well.  Susan Callahan has made steady improvement in MET level since beginning LW.  She has 6 sessions left and plans to go to [employer] to continue exercise.    Expected Outcomes  Short - continue to progress workloads as tolerated Long - increase overall MET level  Short - progress levels on bike and TM Long - increase overall METs  -  Short - complete LW program Long - maintain exercise on her own       Discharge Exercise Prescription (Final Exercise Prescription Changes): Exercise Prescription Changes - 07/17/18 1300      Response to Exercise   Blood Pressure (Admit)  138/72    Blood Pressure (Exit)  120/80    Heart Rate (Admit)  77 bpm    Heart Rate (Exercise)  129 bpm    Heart Rate (Exit)  86 bpm    Oxygen Saturation (Admit)  93 %    Oxygen Saturation (Exercise)  92 %    Oxygen Saturation (Exit)  93 %    Rating of Perceived Exertion (Exercise)  13    Perceived Dyspnea (Exercise)  3    Duration  Continue with 45 min of aerobic exercise without signs/symptoms of physical distress.    Intensity  Other (comment)   reaches RPE      Progression   Progression  Continue to progress workloads to maintain intensity without signs/symptoms of physical distress.    Average METs  3.35      Resistance Training   Training Prescription  Yes    Weight  4 lb    Reps  10-15      Interval Training   Interval Training  No      Recumbant Bike   Level  5    RPM  60    Watts  35    Minutes  15    METs  3.23      NuStep   Level  6    SPM  80    Minutes  15    METs  3.2      Home Exercise Plan   Plans to continue exercise at  [employer]    Frequency  Add 1 additional day to program exercise sessions.    Initial Home Exercises Provided  04/24/18       Nutrition:  Target Goals: Understanding of nutrition guidelines, daily intake of sodium <1564m, cholesterol <2038m calories 30% from fat and 7% or less from saturated fats, daily to have 5 or more servings of  fruits and vegetables.  Biometrics: Pre Biometrics - 04/08/18 1435      Pre Biometrics   Height  5' 5.1" (1.654 m)    Weight  210 lb 12.8 oz (95.6  kg)    Waist Circumference  43 inches    Hip Circumference  44 inches    Waist to Hip Ratio  0.98 %    BMI (Calculated)  34.95    Single Leg Stand  0.72 seconds      Post Biometrics - 07/15/18 1101       Post  Biometrics   Height  5' 5.1" (1.654 m)    Weight  216 lb (98 kg)    Waist Circumference  45 inches    Hip Circumference  44.5 inches    Waist to Hip Ratio  1.01 %    BMI (Calculated)  35.81       Nutrition Therapy Plan and Nutrition Goals: Nutrition Therapy & Goals - 04/17/18 1414      Nutrition Therapy   Diet  TLC    Drug/Food Interactions  Statins/Certain Fruits   reports to consume grapefruit with cottage cheese occasionally   Protein (specify units)  10oz    Fiber  25 grams    Whole Grain Foods  2 servings   3 ideal, not regularly consuming whole grain options   Saturated Fats  12 max. grams    Fruits and Vegetables  5 servings/day   8 ideal. eats 2 meals/day   Sodium  2000 grams      Personal Nutrition Goals   Nutrition Goal  When choosing frozen meals, choose options with no more than 632m of sodium per meal    Personal Goal #2  Opt for frozen over canned vegetables when possible    Personal Goal #3  Eat breakfast on a more consistent schedule, particularly on the days when you are planning to exercise    Comments  She is on a tight budget but is seeking to eat healthier. Does not follow a diabetic diet and consumes a diet high in frozen / pre-prepared foods       Intervention Plan   Intervention  Prescribe, educate and counsel regarding individualized specific dietary modifications aiming towards targeted core components such as weight, hypertension, lipid management, diabetes, heart failure and other comorbidities.;Nutrition handout(s) given to patient.   Spice it up salt-free seasoning recipe sheet, Low  sodium nutrition therapy   Expected Outcomes  Short Term Goal: Understand basic principles of dietary content, such as calories, fat, sodium, cholesterol and nutrients.;Short Term Goal: A plan has been developed with personal nutrition goals set during dietitian appointment.       Nutrition Assessments: Nutrition Assessments - 07/19/18 1133      MEDFICTS Scores   Pre Score  28    Post Score  25    Score Difference  -3       Nutrition Goals Re-Evaluation: Nutrition Goals Re-Evaluation    Row Name 04/17/18 1437 05/15/18 1508 06/12/18 1144         Goals   Current Weight  -  212 lb (96.2 kg)  -     Nutrition Goal  When choosing frozen meals, choose options with no more than 6027mof sodium per meal  Eat healthier and lose weight  When buying frozen meals, look for varieties that have less than 60093mf sodium per meal; opt for frozen over canned vegetables when possible; eat breakfast on a more consistent schedule; eat breakfast on a more consistent schedule     Comment  She consumes frozen meals regularly and does not always pay attention to sodium content  Patient is trying to eat a heart healthy diet. She  has lost a few pounds and trying to keep the junk food out of the house  She has not been buying frozen meals as of recently and she is trying to find healthier options overall, but is stuggling to know what to choose without a "list" or "set plan." She is eating breakfast but not on a daily basis, sometimes has more of a "brunch." She would like written ideas for foods to choose and prepare Gave DASH diet menu options which includes meal and snack ideas     Expected Outcome  She will choose frozen meals with less than 612m of sodium, ideally with at least 15g protein and greater than 4g fiber   Short: lose more weight. Long: Maintain weight loss by dieting independently  She will continue to work on eating breakfast on a more regular basis and will continue to reduce her frozen meal  intake. She will try frozen vegetables as opposed to canned when able and will use the lists provided to give herself an idea of what foods to choose on a daily basis, ideally opting for a diet that is mostly whole-food based       Personal Goal #2 Re-Evaluation   Personal Goal #2  Opt for frozen over canned vegetables when possible  -  -       Personal Goal #3 Re-Evaluation   Personal Goal #3  Eat breakfast on a more consistent schedule, particularly on the days when you are planning to exercise  -  -        Nutrition Goals Discharge (Final Nutrition Goals Re-Evaluation): Nutrition Goals Re-Evaluation - 06/12/18 1144      Goals   Nutrition Goal  When buying frozen meals, look for varieties that have less than 601mof sodium per meal; opt for frozen over canned vegetables when possible; eat breakfast on a more consistent schedule; eat breakfast on a more consistent schedule    Comment  She has not been buying frozen meals as of recently and she is trying to find healthier options overall, but is stuggling to know what to choose without a "list" or "set plan." She is eating breakfast but not on a daily basis, sometimes has more of a "brunch." She would like written ideas for foods to choose and prepare   Gave DASH diet menu options which includes meal and snack ideas   Expected Outcome  She will continue to work on eating breakfast on a more regular basis and will continue to reduce her frozen meal intake. She will try frozen vegetables as opposed to canned when able and will use the lists provided to give herself an idea of what foods to choose on a daily basis, ideally opting for a diet that is mostly whole-food based       Psychosocial: Target Goals: Acknowledge presence or absence of significant depression and/or stress, maximize coping skills, provide positive support system. Participant is able to verbalize types and ability to use techniques and skills needed for reducing stress and  depression.   Initial Review & Psychosocial Screening: Initial Psych Review & Screening - 04/08/18 1422      Initial Review   Current issues with  Current Depression;History of Depression;Current Stress Concerns    Source of Stress Concerns  Financial;Retirement/disability;Unable to perform yard/household activities    Comments  She has not planed to be this disabled when she is retired. She is unable to do yardwork and do the things she used to do.  Family Dynamics   Good Support System?  No    Concerns  No support system    Comments  She states her sister is sort of there but has health issues and things she is dealing with.      Barriers   Psychosocial barriers to participate in program  The patient should benefit from training in stress management and relaxation.      Screening Interventions   Interventions  Encouraged to exercise;Program counselor consult;To provide support and resources with identified psychosocial needs;Provide feedback about the scores to participant    Expected Outcomes  Short Term goal: Utilizing psychosocial counselor, staff and physician to assist with identification of specific Stressors or current issues interfering with healing process. Setting desired goal for each stressor or current issue identified.;Long Term Goal: Stressors or current issues are controlled or eliminated.;Short Term goal: Identification and review with participant of any Quality of Life or Depression concerns found by scoring the questionnaire.;Long Term goal: The participant improves quality of Life and PHQ9 Scores as seen by post scores and/or verbalization of changes       Quality of Life Scores:  Scores of 19 and below usually indicate a poorer quality of life in these areas.  A difference of  2-3 points is a clinically meaningful difference.  A difference of 2-3 points in the total score of the Quality of Life Index has been associated with significant improvement in overall  quality of life, self-image, physical symptoms, and general health in studies assessing change in quality of life.  PHQ-9: Recent Review Flowsheet Data    Depression screen [facility] 2/9 07/19/2018 07/12/2018 04/08/2018   Decreased Interest 0 1 1   Down, Depressed, Hopeless 1 1 1    PHQ - 2 Score 1 2 2    Altered sleeping 0 0 0   Tired, decreased energy 1 3 2    Change in appetite 0 3 1   Feeling bad or failure about yourself  0 1 1   Trouble concentrating 1 3 0   Moving slowly or fidgety/restless 0 0 0   Suicidal thoughts 0 0 0   PHQ-9 Score 3 12 6    Difficult doing work/chores Somewhat difficult Somewhat difficult Somewhat difficult     Interpretation of Total Score  Total Score Depression Severity:  1-4 = Minimal depression, 5-9 = Mild depression, 10-14 = Moderate depression, 15-19 = Moderately severe depression, 20-27 = Severe depression   Psychosocial Evaluation and Intervention: Psychosocial Evaluation - 04/15/18 1042      Psychosocial Evaluation & Interventions   Interventions  Encouraged to exercise with the program and follow exercise prescription;Stress management education    Comments  Counselor met with Ms. Hendricks Milo) today for initial psychosocial evaluation.  She is a 67 year old who has COPD.  Kaesha has a limited support system as she lives alone and has a sister in [place].  She reports sleeping well and having an "okay" appetite - wherein she has begun losing weight recently - which she states is a "good thing."  Jamisyn has a long history of depression and is on medications that help with this.  She reports her mood is stable and she has multiple stressors with her health and finances and a limited support system.  Her goals for this program are to be able to return to more normal activities without SOB; to walk more easily; to increase her energy and stamina and to lose more weight.  Staff will follow with Susan Callahan.  Expected Outcomes  Short:  Susan Callahan will meet with the dietician to  address her weight loss goals.  She will also exercise more consistently to improve her energy; stamina and mood.   Long:  Nechama will develop a routine of positive self-care for her health and mental health.    Continue Psychosocial Services   Follow up required by staff       Psychosocial Re-Evaluation: Psychosocial Re-Evaluation    [place] Name 04/22/18 1123 05/15/18 1503 06/17/18 1117 07/12/18 1420       Psychosocial Re-Evaluation   Current issues with  Current Stress Concerns  Current Stress Concerns;Current Depression  Current Stress Concerns  Current Stress Concerns    Comments  Counselor followed up with Susan Callahan today reporting she is frustrated with the staff in that she cannot remember how to work the machines (with her cognitive issues) and she hates to keep asking them to help.  Tamar states she is beginning to take notes on how to use the machines in this class for her benefit in the future.  Counselor commended her positive self-care and being proactive re: her needs.    Patient states that sometimes she does feel depressed. She had shingles in her ear and it affected her cognitive abilities. She does not have any family here to rely on and she lives alone. It is hard for her to do things becuase she is on a fixed income.  Counselor follow with Susan Callahan today reporting she is experiencing some progress in her energy level; stamina and flexibility since coming into this program.  She is continuing to experience a great deal of back pain as a result of some of the equipment - per her report - and mentioned the need for an injection.  Susan Callahan states she has no supports here locally to accompany her to get this injection at [facility] to alleviate some of the pain problems in her back.  Counselor mentioned to staff and asked [place] to call 211 - to see about local resources to help her with this need.  She is on fixed income and cannot afford to pay someone to do this for her.  Counselor and staff will follow  with Susan Callahan on this.  Counselor commended her on her commitment to this program and on her progress made so far.  Reviewed patient health questionnaire (PHQ-9) with patient for follow up. Previously, patients score indicated signs/symptoms of depression.  Reviewed to see if patient is improving symptom wise while in program.  Score declined and patient states that it is because she has low energy and alot of back pain.    Expected Outcomes  Short:  Susan Callahan will develop independent skills in her self-care by making notes on the machines she uses and how to operate.   Long:  Susan Callahan will continue to exercise consistently to achieve her stated goals of weight loss and increased energy/stamina and improved mood.   Short: Attend [facility] stress management education to decrease stress. Long: Maintain exercise Post [facility] to keep stress at a minimum.  Short:  Susan Callahan will research transportation options for her medical needs re: her chronic back pain.  Long:  Susan Callahan will continue to exercise to maintain progress made of increased stamina and strength.    Short: Continue to work toward an improvement in [place] scores by attending [facility] regularly. Long: Continue to improve stress and depression coping skills by talking with staff and attending [facility] regularly and work toward a positive mental state.  Interventions  -  Encouraged to attend Pulmonary Rehabilitation for the exercise  -  Encouraged to attend Pulmonary Rehabilitation for the exercise    Continue Psychosocial Services   Follow up required by staff  Follow up required by counselor  -  Follow up required by staff       Psychosocial Discharge (Final Psychosocial Re-Evaluation): Psychosocial Re-Evaluation - 07/12/18 1420      Psychosocial Re-Evaluation   Current issues with  Current Stress Concerns    Comments  Reviewed patient health questionnaire (PHQ-9) with patient for follow up. Previously, patients score indicated signs/symptoms of depression.   Reviewed to see if patient is improving symptom wise while in program.  Score declined and patient states that it is because she has low energy and alot of back pain.    Expected Outcomes  Short: Continue to work toward an improvement in [place] scores by attending [facility] regularly. Long: Continue to improve stress and depression coping skills by talking with staff and attending [facility] regularly and work toward a positive mental state.     Interventions  Encouraged to attend Pulmonary Rehabilitation for the exercise    Continue Psychosocial Services   Follow up required by staff       Education: Education Goals: Education classes will be provided on a weekly basis, covering required topics. Participant will state understanding/return demonstration of topics presented.  Learning Barriers/Preferences: Learning Barriers/Preferences - 04/08/18 1428      Learning Barriers/Preferences   Learning Barriers  None    Learning Preferences  None       Education Topics:  Initial Evaluation Education: - Verbal, written and demonstration of respiratory meds, oximetry and breathing techniques. Instruction on use of nebulizers and MDIs and importance of monitoring MDI activations.   Pulmonary Rehab from 07/19/2018 in [facility] Cardiac and Pulmonary Rehab  Date  04/08/18  Educator  [facility]  Instruction Review Code  1- Verbalizes Understanding      General Nutrition Guidelines/Fats and Fiber: -Group instruction provided by verbal, written material, models and posters to present the general guidelines for heart healthy nutrition. Gives an explanation and review of dietary fats and fiber.   Pulmonary Rehab from 07/19/2018 in [facility] Cardiac and Pulmonary Rehab  Date  05/22/18  Educator  [facility]  Instruction Review Code  1- Verbalizes Understanding      Controlling Sodium/Reading Food Labels: -Group verbal and written material supporting the discussion of sodium use in heart healthy nutrition. Review and  explanation with models, verbal and written materials for utilization of the food label.   Pulmonary Rehab from 07/19/2018 in [facility] Cardiac and Pulmonary Rehab  Date  05/29/18  Educator  [facility]  Instruction Review Code  1- Verbalizes Understanding      Exercise Physiology & General Exercise Guidelines: - Group verbal and written instruction with models to review the exercise physiology of the cardiovascular system and associated critical values. Provides general exercise guidelines with specific guidelines to those with heart or lung disease.    Pulmonary Rehab from 07/19/2018 in [facility] Cardiac and Pulmonary Rehab  Date  06/05/18  Educator  [facility]  Instruction Review Code  1- Verbalizes Understanding      Aerobic Exercise & Resistance Training: - Gives group verbal and written instruction on the various components of exercise. Focuses on aerobic and resistive training programs and the benefits of this training and how to safely progress through these programs.   Flexibility, Balance, Mind/Body Relaxation: Provides group verbal/written instruction on the benefits of flexibility and  balance training, including mind/body exercise modes such as yoga, pilates and tai chi.  Demonstration and skill practice provided.   Pulmonary Rehab from 07/19/2018 in [facility] Cardiac and Pulmonary Rehab  Date  07/03/18  Educator  [facility]  Instruction Review Code  1- Verbalizes Understanding      Stress and Anxiety: - Provides group verbal and written instruction about the health risks of elevated stress and causes of high stress.  Discuss the correlation between heart/lung disease and anxiety and treatment options. Review healthy ways to manage with stress and anxiety.   Pulmonary Rehab from 07/19/2018 in [facility] Cardiac and Pulmonary Rehab  Date  06/07/18  Educator  [facility]  Instruction Review Code  1- Verbalizes Understanding      Depression: - Provides group verbal and written instruction on the correlation between heart/lung  disease and depressed mood, treatment options, and the stigmas associated with seeking treatment.   Pulmonary Rehab from 07/19/2018 in [facility] Cardiac and Pulmonary Rehab  Date  06/26/18  Educator  [facility]  Instruction Review Code  1- Verbalizes Understanding      Exercise & Equipment Safety: - Individual verbal instruction and demonstration of equipment use and safety with use of the equipment.   Pulmonary Rehab from 07/19/2018 in [facility] Cardiac and Pulmonary Rehab  Date  04/08/18  Educator  [facility]  Instruction Review Code  1- Verbalizes Understanding      Infection Prevention: - Provides verbal and written material to individual with discussion of infection control including proper hand washing and proper equipment cleaning during exercise session.   Pulmonary Rehab from 07/19/2018 in [facility] Cardiac and Pulmonary Rehab  Date  04/08/18  Educator  [facility]  Instruction Review Code  1- Verbalizes Understanding      Falls Prevention: - Provides verbal and written material to individual with discussion of falls prevention and safety.   Pulmonary Rehab from 07/19/2018 in [facility] Cardiac and Pulmonary Rehab  Date  04/08/18  Educator  [facility]  Instruction Review Code  1- Verbalizes Understanding      Diabetes: - Individual verbal and written instruction to review signs/symptoms of diabetes, desired ranges of glucose level fasting, after meals and with exercise. Advice that pre and post exercise glucose checks will be done for 3 sessions at entry of program.   Chronic Lung Diseases: - Group verbal and written instruction to review updates, respiratory medications, advancements in procedures and treatments. Discuss use of supplemental oxygen including available portable oxygen systems, continuous and intermittent flow rates, concentrators, personal use and safety guidelines. Review proper use of inhaler and spacers. Provide informative websites for self-education.    Pulmonary Rehab from 07/19/2018 in [facility] Cardiac and  Pulmonary Rehab  Date  05/24/18  Educator  [facility]  Instruction Review Code  1- Verbalizes Understanding      Energy Conservation: - Provide group verbal and written instruction for methods to conserve energy, plan and organize activities. Instruct on pacing techniques, use of adaptive equipment and posture/positioning to relieve shortness of breath.   Pulmonary Rehab from 07/19/2018 in [facility] Cardiac and Pulmonary Rehab  Date  07/10/18  Educator  [facility]  Instruction Review Code  1- Verbalizes Understanding      Triggers and Exacerbations: - Group verbal and written instruction to review types of environmental triggers and ways to prevent exacerbations. Discuss weather changes, air quality and the benefits of nasal washing. Review warning signs and symptoms to help prevent infections. Discuss techniques for effective airway clearance, coughing, and vibrations.   Pulmonary Rehab from  07/19/2018 in [facility] Cardiac and Pulmonary Rehab  Date  06/12/18  Educator  [facility]  Instruction Review Code  1- Verbalizes Understanding      AED/CPR: - Group verbal and written instruction with the use of models to demonstrate the basic use of the AED with the basic ABC's of resuscitation.   Pulmonary Rehab from 07/19/2018 in [facility] Cardiac and Pulmonary Rehab  Date  07/19/18  Educator  [facility]  Instruction Review Code  1- Actuary and Physiology of the Lungs: - Group verbal and written instruction with the use of models to provide basic lung anatomy and physiology related to function, structure and complications of lung disease.   Pulmonary Rehab from 07/19/2018 in [facility] Cardiac and Pulmonary Rehab  Date  05/08/18  Educator  [facility]  Instruction Review Code  1- Verbalizes Understanding      Anatomy & Physiology of the Heart: - Group verbal and written instruction and models provide basic cardiac anatomy and physiology, with the coronary electrical and arterial systems. Review of Valvular disease  and Heart Failure   Pulmonary Rehab from 07/19/2018 in [facility] Cardiac and Pulmonary Rehab  Date  06/21/18  Educator  [facility]  Instruction Review Code  1- Verbalizes Understanding      Cardiac Medications: - Group verbal and written instruction to review commonly prescribed medications for heart disease. Reviews the medication, class of the drug, and side effects.   Pulmonary Rehab from 07/19/2018 in [facility] Cardiac and Pulmonary Rehab  Date  07/05/18  Educator  [facility]  Instruction Review Code  1- Verbalizes Understanding      Know Your Numbers and Risk Factors: -Group verbal and written instruction about important numbers in your health.  Discussion of what are risk factors and how they play a role in the disease process.  Review of Cholesterol, Blood Pressure, Diabetes, and BMI and the role they play in your overall health.   Sleep Hygiene: -Provides group verbal and written instruction about how sleep can affect your health.  Define sleep hygiene, discuss sleep cycles and impact of sleep habits. Review good sleep hygiene tips.    Pulmonary Rehab from 07/19/2018 in [facility] Cardiac and Pulmonary Rehab  Date  05/15/18  Educator  [facility]  Instruction Review Code  1- Verbalizes Understanding      Other: -Provides group and verbal instruction on various topics (see comments)    Knowledge Questionnaire Score: Knowledge Questionnaire Score - 07/19/18 1050      Knowledge Questionnaire Score   Pre Score  15/18    Post Score  18/18   reviewed with patient       Core Components/Risk Factors/Patient Goals at Admission: Personal Goals and Risk Factors at Admission - 04/08/18 1432      Core Components/Risk Factors/Patient Goals on Admission    Weight Management  Yes;Weight Loss;Obesity    Intervention  Weight Management: Develop a combined nutrition and exercise program designed to reach desired caloric intake, while maintaining appropriate intake of nutrient and fiber, sodium and fats, and appropriate  energy expenditure required for the weight goal.;Weight Management: Provide education and appropriate resources to help participant work on and attain dietary goals.;Weight Management/Obesity: Establish reasonable short term and long term weight goals.;Obesity: Provide education and appropriate resources to help participant work on and attain dietary goals.    Admit Weight  210 lb 12.8 oz (95.6 kg)    Goal Weight: Short Term  205 lb (93 kg)    Goal Weight: Long  Term  150 lb (68 kg)    Expected Outcomes  Short Term: Continue to assess and modify interventions until short term weight is achieved;Long Term: Adherence to nutrition and physical activity/exercise program aimed toward attainment of established weight goal;Weight Maintenance: Understanding of the daily nutrition guidelines, which includes 25-35% calories from fat, 7% or less cal from saturated fats, less than 258m cholesterol, less than 1.5gm of sodium, & 5 or more servings of fruits and vegetables daily;Weight Loss: Understanding of general recommendations for a balanced deficit meal plan, which promotes 1-2 lb weight loss per week and includes a negative energy balance of [phone] kcal/d;Understanding recommendations for meals to include 15-35% energy as protein, 25-35% energy from fat, 35-60% energy from carbohydrates, less than 2073mof dietary cholesterol, 20-35 gm of total fiber daily;Understanding of distribution of calorie intake throughout the day with the consumption of 4-5 meals/snacks    Improve shortness of breath with ADL's  Yes    Intervention  Provide education, individualized exercise plan and daily activity instruction to help decrease symptoms of SOB with activities of daily living.    Expected Outcomes  Short Term: Improve cardiorespiratory fitness to achieve a reduction of symptoms when performing ADLs;Long Term: Be able to perform more ADLs without symptoms or delay the onset of symptoms    Diabetes  Yes    Intervention   Provide education about signs/symptoms and action to take for hypo/hyperglycemia.;Provide education about proper nutrition, including hydration, and aerobic/resistive exercise prescription along with prescribed medications to achieve blood glucose in normal ranges: Fasting glucose 65-99 mg/dL    Expected Outcomes  Short Term: Participant verbalizes understanding of the signs/symptoms and immediate care of hyper/hypoglycemia, proper foot care and importance of medication, aerobic/resistive exercise and nutrition plan for blood glucose control.;Long Term: Attainment of HbA1C < 7%.    Lipids  Yes    Intervention  Provide education and support for participant on nutrition & aerobic/resistive exercise along with prescribed medications to achieve LDL <7043mHDL >75m4m  Expected Outcomes  Short Term: Participant states understanding of desired cholesterol values and is compliant with medications prescribed. Participant is following exercise prescription and nutrition guidelines.;Long Term: Cholesterol controlled with medications as prescribed, with individualized exercise RX and with personalized nutrition plan. Value goals: LDL < 70mg51mL > 40 mg.       Core Components/Risk Factors/Patient Goals Review:  Goals and Risk Factor Review    Row Name 05/15/18 1515 06/05/18 1548           Core Components/Risk Factors/Patient Goals Review   Personal Goals Review  Weight Management/Obesity;Improve shortness of breath with ADL's;Diabetes;Lipids  Weight Management/Obesity;Improve shortness of breath with ADL's;Diabetes;Lipids      Review  Patient has been doing well in the program. Patient has been monitoring her blood sugar at home. Her shortness of breath is not where she wants it to be yet. She wants to lose weight to help her improve.   Patient states she has been able to do more of the yardwork not that the weather has got cooler. She is doing well in the program and wants to lose more weight. It is hard for  her to walk alot due to her back pain. She is going to see a doctor for her pain to try to manage it better.       Expected Outcomes  Short: Attend [facility] regularly to improve shortness of breath with ADL's. Long: maintain independence with ADL's   Short: lose more weight to  improve shortness of breath. Long: maintain weight loss to help with shortness of breath.         Core Components/Risk Factors/Patient Goals at Discharge (Final Review):  Goals and Risk Factor Review - 06/05/18 1548      Core Components/Risk Factors/Patient Goals Review   Personal Goals Review  Weight Management/Obesity;Improve shortness of breath with ADL's;Diabetes;Lipids    Review  Patient states she has been able to do more of the yardwork not that the weather has got cooler. She is doing well in the program and wants to lose more weight. It is hard for her to walk alot due to her back pain. She is going to see a doctor for her pain to try to manage it better.     Expected Outcomes  Short: lose more weight to improve shortness of breath. Long: maintain weight loss to help with shortness of breath.       ITP Comments: ITP Comments    Row Name 04/08/18 1339 05/06/18 0817 06/03/18 0823 07/01/18 0828 07/19/18 1015   ITP Comments  Medical Evaluation completed. Chart sent for review and changes to Dr. Emily Filbert Director of [place]. Diagnosis can be found in [facility] encounter 01/31/18  30 day review completed. ITP sent to Dr. Emily Filbert Director of [place]. Continue with ITP unless changes are made by physician  30 day review completed. ITP sent to Dr. Emily Filbert Director of [place]. Continue with ITP unless changes are made by physician  30 day review completed. ITP sent to Dr. Emily Filbert Director of [place]. Continue with ITP unless changes are made by physician.  Discharge ITP sent and signed by Dr. Sabra Heck.  Discharge Summary routed to PCP and cardiologist.      Comments: Discharge ITP

## 2018-07-19 NOTE — Progress Notes (Signed)
Discharge Progress Report  Patient Details  Name: Susan Callahan MRN: [id number] Date of Birth: 10-04-1950 Referring Provider:     Pulmonary Rehab from 04/08/2018 in [place] [facility]  Referring Provider  Jamesetta So MD       Number of Visits: 36/36  Reason for Discharge:  Patient reached a stable level of exercise. Patient independent in their exercise. Patient has met program and personal goals.  Smoking History:  Social History   Tobacco Use  Smoking Status Former Smoker  . Packs/day: 3.00  . Years: 27.00  . Pack years: 81.00  . Types: Cigarettes  . Last attempt to quit: 03/15/1991  . Years since quitting: 27.3  Smokeless Tobacco Never Used    Diagnosis:  Shortness of breath  ADL UCSD: Pulmonary Assessment Scores    Row Name 04/08/18 1359 07/19/18 1049       ADL UCSD   ADL Phase  Entry  Exit    SOB Score total  37  18    Rest  1  0    Walk  1  1    Stairs  4  4    Bath  0  0    Dress  0  0    Shop  2  1      CAT Score   CAT Score  19  13      mMRC Score   mMRC Score  1  1       Initial Exercise Prescription: Initial Exercise Prescription - 04/08/18 1400      Date of Initial Exercise RX and Referring Provider   Date  04/08/18    Referring Provider  Jamesetta So MD      Treadmill   MPH  2.5    Grade  1    Minutes  15    METs  3.26      Recumbant Bike   Level  2    RPM  50    Watts  38    Minutes  15    METs  3.2      NuStep   Level  3    SPM  80    Minutes  15    METs  3      Prescription Details   Frequency (times per week)  3    Duration  Progress to 45 minutes of aerobic exercise without signs/symptoms of physical distress      Intensity   THRR 40-80% of Max Heartrate  102-136    Ratings of Perceived Exertion  11-13    Perceived Dyspnea  0-4      Progression   Progression  Continue to progress workloads to maintain intensity without signs/symptoms of physical distress.      Resistance Training    Training Prescription  Yes    Weight  3 lbs    Reps  10-15       Discharge Exercise Prescription (Final Exercise Prescription Changes): Exercise Prescription Changes - 07/17/18 1300      Response to Exercise   Blood Pressure (Admit)  138/72    Blood Pressure (Exit)  120/80    Heart Rate (Admit)  77 bpm    Heart Rate (Exercise)  129 bpm    Heart Rate (Exit)  86 bpm    Oxygen Saturation (Admit)  93 %    Oxygen Saturation (Exercise)  92 %    Oxygen Saturation (Exit)  93 %    Rating of  Perceived Exertion (Exercise)  13    Perceived Dyspnea (Exercise)  3    Duration  Continue with 45 min of aerobic exercise without signs/symptoms of physical distress.    Intensity  Other (comment)   reaches RPE      Progression   Progression  Continue to progress workloads to maintain intensity without signs/symptoms of physical distress.    Average METs  3.35      Resistance Training   Training Prescription  Yes    Weight  4 lb    Reps  10-15      Interval Training   Interval Training  No      Recumbant Bike   Level  5    RPM  60    Watts  35    Minutes  15    METs  3.23      NuStep   Level  6    SPM  80    Minutes  15    METs  3.2      Home Exercise Plan   Plans to continue exercise at  [employer]    Frequency  Add 1 additional day to program exercise sessions.    Initial Home Exercises Provided  04/24/18       Functional Capacity: 6 Minute Walk    Row Name 04/08/18 1429 07/15/18 1057       6 Minute Walk   Phase  Initial  Discharge    Distance  1475 feet  1475 feet    Distance % Change  -  0 %    Distance Feet Change  -  0 ft    Walk Time  6 minutes  6 minutes    # of Rest Breaks  0  0    MPH  2.79  2.79    METS  3.25  3.63    RPE  13  13    Perceived Dyspnea   3  3    VO2 Peak  11.39  12.69    Symptoms  Yes (comment)  No    Comments  SOB  -    Resting HR  68 bpm  86 bpm    Resting BP  144/72  138/72    Resting Oxygen Saturation   95 %  96 %    Exercise  Oxygen Saturation  during 6 min walk  87 %  87 %    Max Ex. HR  112 bpm  129 bpm    Max Ex. BP  154/86  180/80    2 Minute Post BP  146/80  140/82      Interval HR   1 Minute HR  83  104    2 Minute HR  108  122    3 Minute HR  106  124    4 Minute HR  111  -    5 Minute HR  114  -    6 Minute HR  112  129    2 Minute Post HR  81  101    Interval Heart Rate?  Yes  Yes      Interval Oxygen   Interval Oxygen?  Yes  Yes    Baseline Oxygen Saturation %  95 %  96 %    1 Minute Oxygen Saturation %  91 %  93 %    1 Minute Liters of Oxygen  0 L Room Air  0 L    2 Minute Oxygen Saturation %  91 %  88 %    2 Minute Liters of Oxygen  0 L  0 L    3 Minute Oxygen Saturation %  87 %  88 %    3 Minute Liters of Oxygen  0 L  0 L    4 Minute Oxygen Saturation %  93 %  - no reading (battery died)    4 Minute Liters of Oxygen  0 L  0 L    5 Minute Oxygen Saturation %  93 %  - no reading (battery died)    5 Minute Liters of Oxygen  0 L  0 L    6 Minute Oxygen Saturation %  94 %  87 %    6 Minute Liters of Oxygen  0 L  0 L    2 Minute Post Oxygen Saturation %  96 %  93 %    2 Minute Post Liters of Oxygen  0 L  -       Psychological, QOL, Others - Outcomes: PHQ 2/9: Depression screen [facility] 2/9 07/19/2018 07/12/2018 04/08/2018  Decreased Interest 0 1 1  Down, Depressed, Hopeless 1 1 1   PHQ - 2 Score 1 2 2   Altered sleeping 0 0 0  Tired, decreased energy 1 3 2   Change in appetite 0 3 1  Feeling bad or failure about yourself  0 1 1  Trouble concentrating 1 3 0  Moving slowly or fidgety/restless 0 0 0  Suicidal thoughts 0 0 0  PHQ-9 Score 3 12 6   Difficult doing work/chores Somewhat difficult Somewhat difficult Somewhat difficult    Quality of Life:   Personal Goals: Goals established at orientation with interventions provided to work toward goal. Personal Goals and Risk Factors at Admission - 04/08/18 1432      Core Components/Risk Factors/Patient Goals on Admission    Weight Management   Yes;Weight Loss;Obesity    Intervention  Weight Management: Develop a combined nutrition and exercise program designed to reach desired caloric intake, while maintaining appropriate intake of nutrient and fiber, sodium and fats, and appropriate energy expenditure required for the weight goal.;Weight Management: Provide education and appropriate resources to help participant work on and attain dietary goals.;Weight Management/Obesity: Establish reasonable short term and long term weight goals.;Obesity: Provide education and appropriate resources to help participant work on and attain dietary goals.    Admit Weight  210 lb 12.8 oz (95.6 kg)    Goal Weight: Short Term  205 lb (93 kg)    Goal Weight: Long Term  150 lb (68 kg)    Expected Outcomes  Short Term: Continue to assess and modify interventions until short term weight is achieved;Long Term: Adherence to nutrition and physical activity/exercise program aimed toward attainment of established weight goal;Weight Maintenance: Understanding of the daily nutrition guidelines, which includes 25-35% calories from fat, 7% or less cal from saturated fats, less than 237m cholesterol, less than 1.5gm of sodium, & 5 or more servings of fruits and vegetables daily;Weight Loss: Understanding of general recommendations for a balanced deficit meal plan, which promotes 1-2 lb weight loss per week and includes a negative energy balance of [phone] kcal/d;Understanding recommendations for meals to include 15-35% energy as protein, 25-35% energy from fat, 35-60% energy from carbohydrates, less than 2027mof dietary cholesterol, 20-35 gm of total fiber daily;Understanding of distribution of calorie intake throughout the day with the consumption of 4-5 meals/snacks    Improve shortness of breath with ADL's  Yes    Intervention  Provide education, individualized exercise plan and daily activity instruction to help decrease symptoms of SOB with activities of daily living.     Expected Outcomes  Short Term: Improve cardiorespiratory fitness to achieve a reduction of symptoms when performing ADLs;Long Term: Be able to perform more ADLs without symptoms or delay the onset of symptoms    Diabetes  Yes    Intervention  Provide education about signs/symptoms and action to take for hypo/hyperglycemia.;Provide education about proper nutrition, including hydration, and aerobic/resistive exercise prescription along with prescribed medications to achieve blood glucose in normal ranges: Fasting glucose 65-99 mg/dL    Expected Outcomes  Short Term: Participant verbalizes understanding of the signs/symptoms and immediate care of hyper/hypoglycemia, proper foot care and importance of medication, aerobic/resistive exercise and nutrition plan for blood glucose control.;Long Term: Attainment of HbA1C < 7%.    Lipids  Yes    Intervention  Provide education and support for participant on nutrition & aerobic/resistive exercise along with prescribed medications to achieve LDL <68m, HDL >453m    Expected Outcomes  Short Term: Participant states understanding of desired cholesterol values and is compliant with medications prescribed. Participant is following exercise prescription and nutrition guidelines.;Long Term: Cholesterol controlled with medications as prescribed, with individualized exercise RX and with personalized nutrition plan. Value goals: LDL < 7094mHDL > 40 mg.        Personal Goals Discharge: Goals and Risk Factor Review    Row Name 05/15/18 1515 06/05/18 1548           Core Components/Risk Factors/Patient Goals Review   Personal Goals Review  Weight Management/Obesity;Improve shortness of breath with ADL's;Diabetes;Lipids  Weight Management/Obesity;Improve shortness of breath with ADL's;Diabetes;Lipids      Review  Patient has been doing well in the program. Patient has been monitoring her blood sugar at home. Her shortness of breath is not where she wants it to be yet. She  wants to lose weight to help her improve.   Patient states she has been able to do more of the yardwork not that the weather has got cooler. She is doing well in the program and wants to lose more weight. It is hard for her to walk alot due to her back pain. She is going to see a doctor for her pain to try to manage it better.       Expected Outcomes  Short: Attend [facility] regularly to improve shortness of breath with ADL's. Long: maintain independence with ADL's   Short: lose more weight to improve shortness of breath. Long: maintain weight loss to help with shortness of breath.         Exercise Goals and Review: Exercise Goals    Row Name 04/08/18 1435             Exercise Goals   Increase Physical Activity  Yes       Intervention  Provide advice, education, support and counseling about physical activity/exercise needs.;Develop an individualized exercise prescription for aerobic and resistive training based on initial evaluation findings, risk stratification, comorbidities and participant's personal goals.       Expected Outcomes  Short Term: Attend rehab on a regular basis to increase amount of physical activity.;Long Term: Add in home exercise to make exercise part of routine and to increase amount of physical activity.;Long Term: Exercising regularly at least 3-5 days a week.       Increase Strength and Stamina  Yes       Intervention  Provide advice, education, support  and counseling about physical activity/exercise needs.;Develop an individualized exercise prescription for aerobic and resistive training based on initial evaluation findings, risk stratification, comorbidities and participant's personal goals.       Expected Outcomes  Short Term: Increase workloads from initial exercise prescription for resistance, speed, and METs.;Short Term: Perform resistance training exercises routinely during rehab and add in resistance training at home;Long Term: Improve cardiorespiratory fitness,  muscular endurance and strength as measured by increased METs and functional capacity (6MWT)       Able to understand and use rate of perceived exertion (RPE) scale  Yes       Intervention  Provide education and explanation on how to use RPE scale       Expected Outcomes  Short Term: Able to use RPE daily in rehab to express subjective intensity level;Long Term:  Able to use RPE to guide intensity level when exercising independently       Able to understand and use Dyspnea scale  Yes       Intervention  Provide education and explanation on how to use Dyspnea scale       Expected Outcomes  Short Term: Able to use Dyspnea scale daily in rehab to express subjective sense of shortness of breath during exertion;Long Term: Able to use Dyspnea scale to guide intensity level when exercising independently       Knowledge and understanding of Target Heart Rate Range (THRR)  Yes       Intervention  Provide education and explanation of THRR including how the numbers were predicted and where they are located for reference       Expected Outcomes  Short Term: Able to state/look up THRR;Short Term: Able to use daily as guideline for intensity in rehab;Long Term: Able to use THRR to govern intensity when exercising independently       Able to check pulse independently  Yes       Intervention  Provide education and demonstration on how to check pulse in carotid and radial arteries.;Review the importance of being able to check your own pulse for safety during independent exercise       Expected Outcomes  Short Term: Able to explain why pulse checking is important during independent exercise;Long Term: Able to check pulse independently and accurately       Understanding of Exercise Prescription  Yes       Intervention  Provide education, explanation, and written materials on patient's individual exercise prescription       Expected Outcomes  Short Term: Able to explain program exercise prescription;Long Term: Able to  explain home exercise prescription to exercise independently          Exercise Goals Re-Evaluation: Exercise Goals Re-Evaluation    Row Name 04/15/18 1023 04/24/18 1143 04/24/18 1248 05/08/18 1220 05/23/18 0812     Exercise Goal Re-Evaluation   Exercise Goals Review  Increase Physical Activity;Increase Strength and Stamina;Able to understand and use rate of perceived exertion (RPE) scale;Knowledge and understanding of Target Heart Rate Range (THRR);Able to understand and use Dyspnea scale;Understanding of Exercise Prescription  Increase Physical Activity;Increase Strength and Stamina;Able to understand and use rate of perceived exertion (RPE) scale;Able to understand and use Dyspnea scale;Knowledge and understanding of Target Heart Rate Range (THRR);Able to check pulse independently  Increase Physical Activity;Increase Strength and Stamina;Able to understand and use rate of perceived exertion (RPE) scale;Able to understand and use Dyspnea scale;Knowledge and understanding of Target Heart Rate Range (THRR)  Increase Physical Activity;Increase Strength and Stamina;Able  to understand and use rate of perceived exertion (RPE) scale;Able to understand and use Dyspnea scale  Increase Physical Activity;Increase Strength and Stamina;Able to understand and use rate of perceived exertion (RPE) scale;Able to understand and use Dyspnea scale   Comments  Reviewed RPE scale, THR and program prescription with pt today.  Pt voiced understanding and was given a copy of goals to take home.   Reviewed home exercise with pt today.  Pt plans to walk and consider [place] for exercise.  Reviewed THR, pulse, RPE, sign and symptoms, NTG use, and when to call 911 or MD.  Also discussed weather considerations and indoor options.  Pt voiced understanding.  -  Leidy has increased level on NS and overall MET level.  She has increased to 4 lb for strength work.  Staff will monitor progress.  Kersten is meeting RPE goals and has  increased MET level slightly.  Staff will encourage increasing level on bike.   Expected Outcomes  Short: Use RPE daily to regulate intensity. Long: Follow program prescription in THR.  Short - add one day of walking in addition to program sessions and research options like FF Long - maintain exercise on her own  -  Short - continue to attend consistently Long - improve MET level  Short - attend consistently Long - improve MET level   Row Name 06/05/18 1243 06/19/18 1157 07/03/18 1307 07/03/18 1308       Exercise Goal Re-Evaluation   Exercise Goals Review  Increase Physical Activity;Increase Strength and Stamina;Able to understand and use rate of perceived exertion (RPE) scale;Able to understand and use Dyspnea scale  Increase Physical Activity;Increase Strength and Stamina;Able to understand and use rate of perceived exertion (RPE) scale;Able to understand and use Dyspnea scale;Understanding of Exercise Prescription  Increase Physical Activity;Increase Strength and Stamina;Able to understand and use rate of perceived exertion (RPE) scale;Able to understand and use Dyspnea scale;Knowledge and understanding of Target Heart Rate Range (THRR);Understanding of Exercise Prescription  Increase Physical Activity;Increase Strength and Stamina;Able to understand and use rate of perceived exertion (RPE) scale;Able to understand and use Dyspnea scale;Knowledge and understanding of Target Heart Rate Range (THRR);Understanding of Exercise Prescription    Comments  Dalesha has increased overall MET level.  She plans to continue exercise in [employer].  Kierrah continues to increase overall MET level.  She tolerates exercise well.  Shawnte Winton has made steady improvement in MET level since beginning LW.  She has 6 sessions left and plans to go to [employer] to continue exercise.    Expected Outcomes  Short - continue to progress workloads as tolerated Long - increase overall MET level  Short - progress levels on bike and  TM Long - increase overall METs  -  Short - complete LW program Long - maintain exercise on her own       Nutrition & Weight - Outcomes: Pre Biometrics - 04/08/18 1435      Pre Biometrics   Height  5' 5.1" (1.654 m)    Weight  210 lb 12.8 oz (95.6 kg)    Waist Circumference  43 inches    Hip Circumference  44 inches    Waist to Hip Ratio  0.98 %    BMI (Calculated)  34.95    Single Leg Stand  0.72 seconds      Post Biometrics - 07/15/18 1101       Post  Biometrics   Height  5' 5.1" (1.654 m)  Weight  216 lb (98 kg)    Waist Circumference  45 inches    Hip Circumference  44.5 inches    Waist to Hip Ratio  1.01 %    BMI (Calculated)  35.81       Nutrition: Nutrition Therapy & Goals - 04/17/18 1414      Nutrition Therapy   Diet  TLC    Drug/Food Interactions  Statins/Certain Fruits   reports to consume grapefruit with cottage cheese occasionally   Protein (specify units)  10oz    Fiber  25 grams    Whole Grain Foods  2 servings   3 ideal, not regularly consuming whole grain options   Saturated Fats  12 max. grams    Fruits and Vegetables  5 servings/day   8 ideal. eats 2 meals/day   Sodium  2000 grams      Personal Nutrition Goals   Nutrition Goal  When choosing frozen meals, choose options with no more than 637m of sodium per meal    Personal Goal #2  Opt for frozen over canned vegetables when possible    Personal Goal #3  Eat breakfast on a more consistent schedule, particularly on the days when you are planning to exercise    Comments  She is on a tight budget but is seeking to eat healthier. Does not follow a diabetic diet and consumes a diet high in frozen / pre-prepared foods       Intervention Plan   Intervention  Prescribe, educate and counsel regarding individualized specific dietary modifications aiming towards targeted core components such as weight, hypertension, lipid management, diabetes, heart failure and other comorbidities.;Nutrition handout(s)  given to patient.   Spice it up salt-free seasoning recipe sheet, Low sodium nutrition therapy   Expected Outcomes  Short Term Goal: Understand basic principles of dietary content, such as calories, fat, sodium, cholesterol and nutrients.;Short Term Goal: A plan has been developed with personal nutrition goals set during dietitian appointment.       Nutrition Discharge: Nutrition Assessments - 07/19/18 1133      MEDFICTS Scores   Pre Score  28    Post Score  25    Score Difference  -3       Education Questionnaire Score: Knowledge Questionnaire Score - 07/19/18 1050      Knowledge Questionnaire Score   Pre Score  15/18    Post Score  18/18   reviewed with patient      Goals reviewed with patient; copy given to patient.

## 2018-07-22 ENCOUNTER — Other Ambulatory Visit: Payer: Self-pay

## 2018-07-22 ENCOUNTER — Ambulatory Visit
Admission: EM | Admit: 2018-07-22 | Discharge: 2018-07-22 | Disposition: A | Discharge status: Home / Self Care | Payer: Medicare Other | Attending: Family Medicine | Admitting: Family Medicine

## 2018-07-22 ENCOUNTER — Encounter: Payer: Self-pay | Admitting: Emergency Medicine

## 2018-07-22 DIAGNOSIS — Y93H2 Activity, gardening and landscaping: Secondary | ICD-10-CM

## 2018-07-22 DIAGNOSIS — S60352A Superficial foreign body of left thumb, initial encounter: Secondary | ICD-10-CM | POA: Diagnosis not present

## 2018-07-22 DIAGNOSIS — T148XXA Other injury of unspecified body region, initial encounter: Secondary | ICD-10-CM

## 2018-07-22 MED ORDER — MUPIROCIN 2 % EX OINT: TOPICAL_OINTMENT | CUTANEOUS | 0 refills | Status: AC

## 2018-07-22 NOTE — ED Triage Notes (Signed)
Patient c/o splinter in her left thumb yesterday from a piece of wood. She has tried to get the splinter out with no success.

## 2018-07-22 NOTE — Discharge Instructions (Signed)
Use medication as prescribed. Keep clean. Continue to monitor as discussed.   Follow up with your primary care physician this week as needed. Return to Urgent care for new or worsening concerns.

## 2018-07-22 NOTE — ED Provider Notes (Signed)
[facility] ____________________________________________  Time seen: Approximately 1:56 PM  I have reviewed the triage vital signs and the nursing notes.   HISTORY  Chief Complaint Foreign Body in Skin (APPT)   HPI Susan Callahan is a 67 y.o. female presents for evaluation of splinter and left thumb since yesterday.  Patient states that she is working in her guarding and cleaning all findings from a tomato stick and in gliding her hand she obtained a splinter in her thumb.  States that she tried to remove it yesterday unsuccessfully.  Has soaked in Epsom salts without resolution.  Tetanus immunization up-to-date and within the last 10 years.  States minimal pain to the area.  No drainage.  Denies other aggravating alleviating factors.  Reports otherwise feels well denies other complaints.   Konrad Saha, MD: PCP    Past Medical History:  Diagnosis Date  . Diabetes mellitus without complication ([place])   . Hypertension   . Migraine   . 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   Past Surgical History:  Procedure Laterality Date  . LUMBAR PERITONEAL SHUNT    . TYMPANOSTOMY TUBE PLACEMENT       No current facility-administered medications for this encounter.   Current Outpatient Medications:  .  acetaZOLAMIDE (DIAMOX) 250 MG tablet, Take by mouth., Disp: , Rfl:  .  albuterol (PROVENTIL HFA;VENTOLIN HFA) 108 (90 Base) MCG/ACT inhaler, Inhale into the lungs., Disp: , Rfl:  .  allopurinol (ZYLOPRIM) 300 MG tablet, Take by mouth., Disp: , Rfl:  .  amitriptyline (ELAVIL) 10 MG tablet, Take by mouth., Disp: , Rfl:  .  aspirin EC 81 MG tablet, Take by mouth., Disp: , Rfl:  .  atorvastatin (LIPITOR) 20 MG tablet, Take by mouth., Disp: , Rfl:  .  b complex vitamins capsule, Take by mouth., Disp: , Rfl:  .  Calcium Carbonate-Vitamin D 600-400 MG-UNIT tablet, Take by mouth., Disp: , Rfl:  .  Coenzyme Q10 100 MG  capsule, Take by mouth., Disp: , Rfl:  .  furosemide (LASIX) 40 MG tablet, Take by mouth., Disp: , Rfl:  .  gabapentin (NEURONTIN) 600 MG tablet, Take by mouth., Disp: , Rfl:  .  magnesium oxide (MAG-OX) 400 MG tablet, Take by mouth., Disp: , Rfl:  .  metFORMIN (GLUCOPHAGE-XR) 500 MG 24 hr tablet, Take by mouth., Disp: , Rfl:  .  Multiple Vitamin (MULTIVITAMIN) capsule, Take by mouth., Disp: , Rfl:  .  naproxen (EC NAPROSYN) 500 MG EC tablet, Take one pill bid for one weeks, then prn inflammation, Disp: , Rfl:  .  Omega-3 Fatty Acids ([employer] FISH OIL) 1200 MG CAPS, Take by mouth., Disp: , Rfl:  .  spironolactone (ALDACTONE) 100 MG tablet, Take 100 mg by mouth daily., Disp: , Rfl: 2 .  umeclidinium bromide (INCRUSE ELLIPTA) 62.5 MCG/INH AEPB, Inhale into the lungs., Disp: , Rfl:  .  verapamil (CALAN-SR) 240 MG CR tablet, Take by mouth., Disp: , Rfl:  .  mupirocin ointment (BACTROBAN) 2 %, Apply two times a day for 7 days., Disp: 22 g, Rfl: 0  Allergies Penicillins and Sulfa antibiotics  History reviewed. No pertinent family history.  Social History Social History   Tobacco Use  . Smoking status: Former Smoker    Packs/day: 3.00    Years: 27.00    Pack years: 81.00    Types: Cigarettes    Last attempt to quit: 03/15/1991  Years since quitting: 27.3  . Smokeless tobacco: Never Used  Substance Use Topics  . Alcohol use: Never    Frequency: Never  . Drug use: Never    Review of Systems Constitutional: No fever Musculoskeletal:Positive thumb pain.  Skin: As above.   ____________________________________________   PHYSICAL EXAM:  VITAL SIGNS: ED Triage Vitals  Enc Vitals Group     BP 07/22/18 1128 (!) 146/78     Pulse Rate 07/22/18 1128 67     Resp 07/22/18 1128 18     Temp 07/22/18 1128 98.3 F (36.8 C)     Temp Source 07/22/18 1128 Oral     SpO2 07/22/18 1128 98 %     Weight 07/22/18 1127 210 lb (95.3 kg)     Height 07/22/18 1127 5\' 5"  (1.651 m)     Head Circumference  --      Peak Flow --      Pain Score 07/22/18 1127 3     Pain Loc --      Pain Edu? --      Excl. in [place]? --     Constitutional: Alert and oriented. Well appearing and in no acute distress. Cardiovascular: Good peripheral circulation. Respiratory: Normal respiratory effort without tachypnea nor retractions.  Musculoskeletal:  Steady gait. Left hand normal distal sensation and capillary refill to all digits. Neurologic:  Normal speech and language. Speech is normal. No gait instability.  Skin:  Skin is warm, dry.  Except: Left distal thumb palmar aspect mid fat pad area of mild swelling with foreign body visualized, minimally tenderness to fat pad only, no bony tenderness, dorsal tenderness, full range of motion present with good strength. Psychiatric: Mood and affect are normal. Speech and behavior are normal. Patient exhibits appropriate insight and judgment   ___________________________________________   LABS (all labs ordered are listed, but only abnormal results are displayed)  Labs Reviewed - No data to display ____________________________________________   PROCEDURES Procedures   Procedure explained and verbal consent obtained. Area cleaned and prepped with betadine.  3 mL's 2% lidocaine used for anesthesia. Standard sterile procedure #11 blade utilized and superficial incision noted, sterile forceps utilized to remove foreign body.  Foreign body approximately 3 mm black brown with wood grain appearance.  Area again cleaned with Betadine and flushed with saline.  No retained foreign body visualized.  Patient tolerated well.  Dressing applied.  INITIAL IMPRESSION / ASSESSMENT AND PLAN / ED COURSE  Pertinent labs & imaging results that were available during my care of the patient were reviewed by me and considered in my medical decision making (see chart for details).  Well-appearing patient.  No acute distress.  Left thumb foreign body noted.  Tetanus immunization is  up-to-date.  Removed as above.  Patient tolerated well.  Will treat with topical Bactroban.  Discussed monitoring for any redness, swelling or drainage, and seek reevaluation.  Discussed keeping clean and supportive care.Discussed indication, risks and benefits of medications with patient.  Discussed follow up with Primary care physician this week as needed. Discussed follow up and return parameters including no resolution or any worsening concerns. Patient verbalized understanding and agreed to plan.   ____________________________________________   FINAL CLINICAL IMPRESSION(S) / ED DIAGNOSES  Final diagnoses:  Splinter in skin     ED Discharge Orders         Ordered    mupirocin ointment (BACTROBAN) 2 %     07/22/18 1319           Note:  This dictation was prepared with Dragon dictation along with smaller phrase technology. Any transcriptional errors that result from this process are unintentional.         Marylene Land, NP 07/22/18 1435

## 2018-07-23 ENCOUNTER — Ambulatory Visit
Admission: RE | Admit: 2018-07-23 | Discharge: 2018-07-23 | Disposition: A | Discharge status: Home / Self Care | Payer: Medicare Other | Source: Ambulatory Visit | Attending: Nurse Practitioner | Admitting: Nurse Practitioner

## 2018-07-23 ENCOUNTER — Ambulatory Visit (HOSPITAL_BASED_OUTPATIENT_CLINIC_OR_DEPARTMENT_OTHER): Payer: Medicare Other | Admitting: Nurse Practitioner

## 2018-07-23 ENCOUNTER — Other Ambulatory Visit: Payer: Self-pay

## 2018-07-23 ENCOUNTER — Encounter: Payer: Self-pay | Admitting: Nurse Practitioner

## 2018-07-23 ENCOUNTER — Ambulatory Visit: Payer: Medicare Other

## 2018-07-23 VITALS — BP 135/69 | HR 59 | Temp 98.0°F | Resp 18 | Ht 65.0 in | Wt 213.0 lb

## 2018-07-23 DIAGNOSIS — M47816 Spondylosis without myelopathy or radiculopathy, lumbar region: Secondary | ICD-10-CM | POA: Insufficient documentation

## 2018-07-23 DIAGNOSIS — Z87891 Personal history of nicotine dependence: Secondary | ICD-10-CM | POA: Insufficient documentation

## 2018-07-23 DIAGNOSIS — M79605 Pain in left leg: Secondary | ICD-10-CM | POA: Insufficient documentation

## 2018-07-23 DIAGNOSIS — I700 Atherosclerosis of aorta: Secondary | ICD-10-CM | POA: Insufficient documentation

## 2018-07-23 DIAGNOSIS — J449 Chronic obstructive pulmonary disease, unspecified: Secondary | ICD-10-CM | POA: Insufficient documentation

## 2018-07-23 DIAGNOSIS — Z789 Other specified health status: Secondary | ICD-10-CM | POA: Insufficient documentation

## 2018-07-23 DIAGNOSIS — M5441 Lumbago with sciatica, right side: Secondary | ICD-10-CM

## 2018-07-23 DIAGNOSIS — Z7984 Long term (current) use of oral hypoglycemic drugs: Secondary | ICD-10-CM

## 2018-07-23 DIAGNOSIS — G8929 Other chronic pain: Secondary | ICD-10-CM

## 2018-07-23 DIAGNOSIS — N950 Postmenopausal bleeding: Secondary | ICD-10-CM | POA: Insufficient documentation

## 2018-07-23 DIAGNOSIS — Z9889 Other specified postprocedural states: Secondary | ICD-10-CM

## 2018-07-23 DIAGNOSIS — M5442 Lumbago with sciatica, left side: Secondary | ICD-10-CM

## 2018-07-23 DIAGNOSIS — M899 Disorder of bone, unspecified: Secondary | ICD-10-CM

## 2018-07-23 DIAGNOSIS — M19042 Primary osteoarthritis, left hand: Secondary | ICD-10-CM | POA: Insufficient documentation

## 2018-07-23 DIAGNOSIS — M8588 Other specified disorders of bone density and structure, other site: Secondary | ICD-10-CM | POA: Diagnosis not present

## 2018-07-23 DIAGNOSIS — M533 Sacrococcygeal disorders, not elsewhere classified: Secondary | ICD-10-CM | POA: Insufficient documentation

## 2018-07-23 DIAGNOSIS — I10 Essential (primary) hypertension: Secondary | ICD-10-CM | POA: Insufficient documentation

## 2018-07-23 DIAGNOSIS — E119 Type 2 diabetes mellitus without complications: Secondary | ICD-10-CM

## 2018-07-23 DIAGNOSIS — M79604 Pain in right leg: Secondary | ICD-10-CM

## 2018-07-23 DIAGNOSIS — Z79899 Other long term (current) drug therapy: Secondary | ICD-10-CM | POA: Insufficient documentation

## 2018-07-23 DIAGNOSIS — G894 Chronic pain syndrome: Secondary | ICD-10-CM | POA: Diagnosis not present

## 2018-07-23 NOTE — Progress Notes (Signed)
Patient's Name: Susan Callahan  MRN: [id number]  Referring Provider: Konrad Saha, MD  DOB: 1951/05/26  PCP: Konrad Saha, MD  DOS: 07/23/2018  Note by: Dionisio David NP  Service setting: Ambulatory outpatient  Specialty: Interventional Pain Management  Location: [facility] (AMB) Pain Management Facility    Patient type: New Patient    Primary Reason(s) for Visit: Initial Patient Evaluation CC: Leg Pain (bilaterally) and Back Pain (lower)  HPI  Susan Callahan is a 67 y.o. year old, female patient, who comes today for an initial evaluation. 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; Neuropathic postherpetic trigeminal neuralgia; Obesity; Osteopenia; Plantar fasciitis of left foot; PMB (postmenopausal bleeding); Post herpetic neuralgia; Primary osteoarthritis of first carpometacarpal joint of left hand; Pulmonary nodules; Rash; Right shoulder pain; Spondylosis of lumbar region without myelopathy or radiculopathy; Status post lumboperitoneal shunt placement; Tympanic membrane perforation; Type 2 diabetes mellitus without complications ([place]); Chronic pain of both lower extremities (Primary Area of Pain) (L>R); Chronic bilateral low back pain with bilateral sciatica (Secondary Area of Pain) (L>R); Chronic pain syndrome; Pharmacologic therapy; Disorder of skeletal system; Problems influencing health status; and Chronic sacroiliac joint pain (Tertiary Area of Pain) (L>R) on their problem list.. Her primarily  concern today is the Leg Pain (bilaterally) and Back Pain (lower)  Pain Assessment: Location: Right, Left Leg Radiating: Radiates from "below butt cheeks" down both legs to calves; pt also c/o independent intermittent low back pain Onset: More than a month ago Duration: Chronic pain Quality: Constant, Burning("stretching, pulling pain") Severity: 5 /10 (subjective, self-reported pain score)  Note: Reported level is compatible with observation.                          Effect on ADL: limits daily activities Timing: Constant Modifying factors: sitting, meds BP: 135/69  HR: (!) 59  Onset and Duration: Gradual and Date of onset: 2008 Cause of pain: Unknown Severity: Getting worse, NAS-11 at its worse: 10/10, NAS-11 at its best: 3/10, NAS-11 now: 5/10 and NAS-11 on the average: 5/10 Timing: Not influenced by the time of the day, During activity or exercise and After activity or exercise Aggravating Factors: Climbing, Lifiting, Prolonged standing, Walking and Walking uphill Alleviating Factors: Medications, Sitting, Sleeping and physical therapy Associated Problems: Constipation, Night-time cramps, Depression, Dizziness, Fatigue and Inability to concentrate Quality of Pain: Aching, Burning, Cramping, Feeling of constriction and Getting longer Previous Examinations or Tests: Bone scan, CT scan, Ct-Myelogram, MRI scan, Myelogram, Spinal tap, X-rays and Orthopedic evaluation Previous Treatments: Chiropractic manipulations, Epidural steroid injections, Narcotic medications, Pool exercises and Steroid treatments by mouth  The patient comes into the clinics today for the first time for a chronic pain management evaluation.  According to the patient her primary area of pain is in her legs.  She feels that the leg pain is about equal.  She admits that is a constant pain.  She denies any numbness tingling or weakness.  The pain goes down the back of her leg to the ankle.  She denies a nerve conduction  study.  Second area of pain  is in her lower back.  She admits that it does go across her back.  She denies any previous surgery.  She did have a LP shunt placed in 2011 secondary to shingles.  She was diagnosed with increased intracranial pressure with increased dizziness.  She admits that this has been effective.  She has had epidural steroid injections started in 2010 in [place] by Dr.Smyth.  She admits that the first 1 was effective however she was never able to get that same relief.  She has recently seen [facility] pain clinic and was referred to physical therapy.  She is currently doing physical therapy.  She admits that it is helping but it takes too long to work.  She has not had any recent images.  Today I took the time to provide the patient with information regarding this pain practice. The patient was informed that the practice is divided into two sections: an interventional pain management section, as well as a completely separate and distinct medication management section. I explained that there are procedure days for interventional therapies, and evaluation days for follow-ups and medication management. Because of the amount of documentation required during both, they are kept separated. This means that there is the possibility that she may be scheduled for a procedure on one day, and medication management the next. I have also informed her that because of staffing and facility limitations, this practice will no longer take patients for medication management only. To illustrate the reasons for this, I gave the patient the example of surgeons, and how inappropriate it would be to refer a patient to his/her care, just to write for the post-surgical antibiotics on a surgery done by a different surgeon.   Because interventional pain management is part of the board-certified specialty for the doctors, the patient was informed that joining this practice means that they are open  to any and all interventional therapies. I made it clear that this does not mean that they will be forced to have any procedures done. What this means is that I believe interventional therapies to be essential part of the diagnosis and proper management of chronic pain conditions. Therefore, patients not interested in these interventional alternatives will be better served under the care of a different practitioner.  The patient was also made aware of my Comprehensive Pain Management Safety Guidelines where by joining this practice, they limit all of their nerve blocks and joint injections to those done by our practice, for as long as we are retained to manage their care. Historic Controlled Substance Pharmacotherapy Review  PMP and historical list of controlled substances: Tramadol 50 mg, ketamine powder, oxycodone 5 mg, acetaminophen with codeine No. 3, diazepam 10 mg, PMP limited secondary to prior [place] residency Highest opioid analgesic regimen found: Oxycodone 5 mg 1 tablet 5 times daily for 1 day (fill date 06/01/2016) oxycodone 25 mg/day Most recent opioid analgesic: Tramadol 50 mg 1 tablet twice daily (fill date 05/21/2018) tramadol 100 mg/day Current opioid analgesics: Tramadol 50 mg 1 tablet twice daily (fill date 05/21/2018) tramadol 100 mg/day Highest recorded MME/day: 37.5 mg/day MME/day: 10 mg/day Medications: The patient did not bring the medication(s) to the appointment, as requested in our "New Patient Package" Pharmacodynamics: Desired effects: Analgesia: The patient reports >50% benefit. Reported improvement in function: The patient reports medication allows her to accomplish basic ADLs. Clinically meaningful improvement in function (CMIF): Sustained CMIF goals met Perceived effectiveness: Described as relatively effective, allowing for increase in activities of daily  living (ADL) Undesirable effects: Side-effects or Adverse reactions: None reported Historical Monitoring: The  patient  reports that she does not use drugs. List of all UDS Test(s): No results found for: MDMA, COCAINSCRNUR, PCPSCRNUR, PCPQUANT, CANNABQUANT, THCU, [place] List of all Serum Drug Screening Test(s):  No results found for: AMPHSCRSER, BARBSCRSER, BENZOSCRSER, COCAINSCRSER, PCPSCRSER, PCPQUANT, THCSCRSER, CANNABQUANT, OPIATESCRSER, OXYSCRSER, PROPOXSCRSER Historical Background Evaluation: [place] PDMP: Six (6) year initial data search conducted.             [place] Department of public safety, offender search: Editor, commissioning Information) Non-contributory Risk Assessment Profile: Aberrant behavior: None observed or detected today Risk factors for fatal opioid overdose: None identified today Fatal overdose hazard ratio (HR): Calculation deferred Non-fatal overdose hazard ratio (HR): Calculation deferred Risk of opioid abuse or dependence: 0.7-3.0% with doses ? 36 MME/day and 6.1-26% with doses ? 120 MME/day. Substance use disorder (SUD) risk level: Pending results of Medical Psychology Evaluation for SUD Opioid risk tool (ORT) (Total Score): 1  ORT Scoring interpretation table:  Score <3 = Low Risk for SUD  Score between 4-7 = Moderate Risk for SUD  Score >8 = High Risk for Opioid Abuse   PHQ-2 Depression Scale:  Total score: 1  PHQ-2 Scoring interpretation table: (Score and probability of major depressive disorder)  Score 0 = No depression  Score 1 = 15.4% Probability  Score 2 = 21.1% Probability  Score 3 = 38.4% Probability  Score 4 = 45.5% Probability  Score 5 = 56.4% Probability  Score 6 = 78.6% Probability   PHQ-9 Depression Scale:  Total score: 2  PHQ-9 Scoring interpretation table:  Score 0-4 = No depression  Score 5-9 = Mild depression  Score 10-14 = Moderate depression  Score 15-19 = Moderately severe depression  Score 20-27 = Severe depression (2.4 times higher risk of SUD and 2.89 times higher risk of overuse)   Pharmacologic Plan: Pending ordered tests and/or consults  Meds  The  patient has a current medication list which includes the following prescription(s): allopurinol, amitriptyline, b complex vitamins, calcium carbonate-vitamin d, furosemide, gabapentin, magnesium oxide, metformin, multivitamin, mupirocin ointment, spironolactone, tramadol, umeclidinium bromide, and verapamil.  Current Outpatient Medications on File Prior to Visit  Medication Sig  . allopurinol (ZYLOPRIM) 300 MG tablet Take by mouth.  Marland Kitchen amitriptyline (ELAVIL) 10 MG tablet Take by mouth.  Marland Kitchen b complex vitamins capsule Take by mouth.  . Calcium Carbonate-Vitamin D 600-400 MG-UNIT tablet Take by mouth.  . furosemide (LASIX) 40 MG tablet Take by mouth.  . gabapentin (NEURONTIN) 600 MG tablet Take by mouth at bedtime.   . magnesium oxide (MAG-OX) 400 MG tablet Take by mouth.  . metFORMIN (GLUCOPHAGE-XR) 500 MG 24 hr tablet Take 500 mg by mouth 2 (two) times daily.   . Multiple Vitamin (MULTIVITAMIN) capsule Take by mouth.  . mupirocin ointment (BACTROBAN) 2 % Apply two times a day for 7 days.  Marland Kitchen spironolactone (ALDACTONE) 100 MG tablet Take 100 mg by mouth daily.  . traMADol (ULTRAM) 50 MG tablet Take by mouth 2 (two) times daily.  Marland Kitchen umeclidinium bromide (INCRUSE ELLIPTA) 62.5 MCG/INH AEPB Inhale into the lungs.  . verapamil (CALAN-SR) 240 MG CR tablet Take by mouth.   No current facility-administered medications on file prior to visit.    Imaging Review  Note: No new results found.        ROS  Cardiovascular History: No reported cardiovascular signs or symptoms such as High blood pressure, coronary artery disease, abnormal heart rate or rhythm,  heart attack, blood thinner therapy or heart weakness and/or failure Pulmonary or Respiratory History: Lung problems, Difficulty blowing air out (Emphysema) and Shortness of breath Neurological History: Curved spine Review of Past Neurological Studies: No results found for this or any previous visit. Psychological-Psychiatric History:  Depressed Gastrointestinal History: Irregular, infrequent bowel movements (Constipation) Genitourinary History: No reported renal or genitourinary signs or symptoms such as difficulty voiding or producing urine, peeing blood, non-functioning kidney, kidney stones, difficulty emptying the bladder, difficulty controlling the flow of urine, or chronic kidney disease Hematological History: No reported hematological signs or symptoms such as prolonged bleeding, low or poor functioning platelets, bruising or bleeding easily, hereditary bleeding problems, low energy levels due to low hemoglobin or being anemic Endocrine History: High blood sugar controlled without the use of insulin (NIDDM) Rheumatologic History: Constant unexplained fatigue (Chronic Fatigue Syndrome) Musculoskeletal History: Negative for myasthenia gravis, muscular dystrophy, multiple sclerosis or malignant hyperthermia Work History: Retired  Allergies  Ms. Wahler is allergic to penicillins; sulfa antibiotics; and bee venom.  Laboratory Chemistry  Inflammation Markers No results found for: CRP, ESRSEDRATE (CRP: Acute Phase) (ESR: Chronic Phase) Renal Function Markers No results found for: BUN, CREATININE, GFRAA, GFRNONAA Hepatic Function Markers No results found for: AST, ALT, ALBUMIN, ALKPHOS, HCVAB Electrolytes No results found for: NA, K, CL, CALCIUM, MG Neuropathy Markers No results found for: [id number] Bone Pathology Markers No results found for: Hendricks Milo, VD125OH2TOT, [id number], [id number], 25OHVITD1, 25OHVITD2, 25OHVITD3, CALCIUM, TESTOFREE, TESTOSTERONE Coagulation Parameters No results found for: INR, LABPROT, APTT, PLT Cardiovascular Markers No results found for: BNP, HGB, HCT Note: Lab results reviewed.  [place]  Drug: Ms. Michelini  reports that she does not use drugs. Alcohol:  reports that she does not drink alcohol. Tobacco:  reports that she quit smoking about 27 years ago. Her smoking use included  cigarettes. She has a 81.00 pack-year smoking history. She has never used smokeless tobacco. Medical:  has a past medical history of COPD (chronic obstructive pulmonary disease) ([place]), Depression (1974), Diabetes mellitus without complication ([place]), Hypertension, Migraine, and Ramsay Hunt auricular syndrome. Family: family history is not on file.  Past Surgical History:  Procedure Laterality Date  . LUMBAR PERITONEAL SHUNT    . TYMPANOSTOMY TUBE PLACEMENT     Active Ambulatory Problems    Diagnosis Date Noted  . Allergic rhinitis, mild 10/12/2011  . Facial nerve paresis 07/31/2012  . Disequilibrium syndrome 08/26/2013  . Biceps tendonitis on right 05/19/2014  . Blepharospasm 05/08/2012  . Chronic bronchitis ([place]) 06/05/2011  . Chronic dysfunction of left eustachian tube 03/24/2014  . 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 Diastasis recti 04/08/2013  . Encounter for screening mammogram for malignant neoplasm of breast 12/02/2014  . Essential hypertension 05/24/2011  . Facial weakness 05/05/2014  . Family history of colon cancer 12/02/2013  . Goiter 12/02/2014  . Hemoptysis 12/22/2011  . Idiopathic chronic gout of right foot 07/06/2014  . 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 Neuropathic postherpetic trigeminal neuralgia 12/12/2011  . Obesity 03/28/2012  . Osteopenia 03/13/2014  . Plantar fasciitis of left foot 05/26/2015  . PMB (postmenopausal bleeding) 05/26/2016  . Post herpetic neuralgia 05/05/2014  . Primary osteoarthritis of first carpometacarpal joint of left hand 05/04/2017  . Pulmonary nodules 05/05/2011  . Rash 12/27/2015  . Right shoulder pain 05/19/2014  . Spondylosis of lumbar region without myelopathy or radiculopathy 05/26/2015  . Status post lumboperitoneal shunt placement 02/26/2018  .  Tympanic membrane perforation 05/30/2016  . Type 2 diabetes mellitus without complications  ([place]) 03/49/1791  . Chronic pain of both lower extremities (Primary Area of Pain) (L>R) 07/23/2018  . Chronic bilateral low back pain with bilateral sciatica (Secondary Area of Pain) (L>R) 07/23/2018  . Chronic pain syndrome 07/23/2018  . Pharmacologic therapy 07/23/2018  . Disorder of skeletal system 07/23/2018  . Problems influencing health status 07/23/2018  . Chronic sacroiliac joint pain [facility] Area of Pain) (L>R) 07/23/2018   Resolved Ambulatory Problems    Diagnosis Date Noted  . No Resolved Ambulatory Problems   Past Medical History:  Diagnosis Date  . COPD (chronic obstructive pulmonary disease) ([place])   . Depression 1974  . Diabetes mellitus without complication ([place])   . Hypertension   . Migraine   . Ramsay Hunt auricular syndrome    Constitutional Exam  General appearance: Well nourished, well developed, and well hydrated. In no apparent acute distress Vitals:   07/23/18 1028  BP: 135/69  Pulse: (!) 59  Resp: 18  Temp: 98 F (36.7 C)  TempSrc: Oral  SpO2: 94%  Weight: 213 lb (96.6 kg)  Height: 5' 5"  (1.651 m)   BMI Assessment: Estimated body mass index is 35.45 kg/m as calculated from the following:   Height as of this encounter: 5' 5"  (1.651 m).   Weight as of this encounter: 213 lb (96.6 kg).  BMI interpretation table: BMI level Category Range association with higher incidence of chronic pain  <18 kg/m2 Underweight   18.5-24.9 kg/m2 Ideal body weight   25-29.9 kg/m2 Overweight Increased incidence by 20%  30-34.9 kg/m2 Obese (Class I) Increased incidence by 68%  35-39.9 kg/m2 Severe obesity (Class II) Increased incidence by 136%  >40 kg/m2 Extreme obesity (Class III) Increased incidence by 254%   BMI Readings from Last 4 Encounters:  07/23/18 35.45 kg/m  07/22/18 34.95 kg/m  07/15/18 35.83 kg/m  04/24/18 34.95 kg/m   Wt Readings from Last 4 Encounters:  07/23/18 213 lb (96.6 kg)  07/22/18 210 lb (95.3 kg)  07/15/18 216 lb (98 kg)   04/24/18 210 lb (95.3 kg)  Psych/Mental status: Alert, oriented x 3 (person, place, & time)       Eyes: PERLA Respiratory: No evidence of acute respiratory distress  Cervical Spine Exam  Inspection: No masses, redness, or swelling Alignment: Symmetrical Functional ROM: Unrestricted ROM      Stability: No instability detected Muscle strength & Tone: Functionally intact Sensory: Unimpaired Palpation: No palpable anomalies              Upper Extremity (UE) Exam    Side: Right upper extremity  Side: Left upper extremity  Inspection: No masses, redness, swelling, or asymmetry. No contractures  Inspection: No masses, redness, swelling, or asymmetry. No contractures  Functional ROM: Unrestricted ROM          Functional ROM: Unrestricted ROM          Muscle strength & Tone: Functionally intact  Muscle strength & Tone: Functionally intact  Sensory: Unimpaired  Sensory: Unimpaired  Palpation: No palpable anomalies              Palpation: No palpable anomalies              Specialized Test(s): Deferred         Specialized Test(s): Deferred          Thoracic Spine Exam  Inspection: No masses, redness, or swelling Alignment: Symmetrical Functional ROM: Unrestricted ROM Stability: No instability detected  Sensory: Unimpaired Muscle strength & Tone: No palpable anomalies  Lumbar Spine Exam  Inspection: Well healed scar from previous spine surgery detected Alignment: Symmetrical Functional ROM: Full ROM      Stability: No instability detected Muscle strength & Tone: Functionally intact Sensory: Unimpaired Palpation: Non-tender       Provocative Tests:  Lumbar Hyperextension and rotation test: Negative      Leg raises negative bilaterally Patrick's Maneuver: Positive for left-sided S-I arthralgia              Gait & Posture Assessment  Ambulation: Unassisted Gait: Relatively normal for age and body habitus Posture: WNL   Lower Extremity Exam    Side: Right lower extremity  Side: Left  lower extremity  Inspection: No masses, redness, swelling, or asymmetry. No contractures  Inspection: No masses, redness, swelling, or asymmetry. No contractures  Functional ROM: Unrestricted ROM          Functional ROM: Unrestricted ROM          Muscle strength & Tone: Functionally intact  Muscle strength & Tone: Functionally intact  Sensory: Unimpaired  Sensory: Unimpaired  Palpation: No palpable anomalies  Palpation: No palpable anomalies   Assessment  Primary Diagnosis & Pertinent Problem List: The primary encounter diagnosis was Chronic pain of both lower extremities (Primary Area of Pain) (L>R). Diagnoses of Chronic bilateral low back pain with bilateral sciatica (Secondary Area of Pain) (L>R), Chronic pain syndrome, Pharmacologic therapy, Disorder of skeletal system, Problems influencing health status, and Chronic sacroiliac joint pain (Tertiary Area of Pain) (L>R) were also pertinent to this visit.  Visit Diagnosis: 1. Chronic pain of both lower extremities (Primary Area of Pain) (L>R)   2. Chronic bilateral low back pain with bilateral sciatica (Secondary Area of Pain) (L>R)   3. Chronic pain syndrome   4. Pharmacologic therapy   5. Disorder of skeletal system   6. Problems influencing health status   7. Chronic sacroiliac joint pain (Tertiary Area of Pain) (L>R)    Plan of Care  Initial treatment plan:  Please be advised that as per protocol, today's visit has been an evaluation only. We have not taken over the patient's controlled substance management.  Problem-specific plan: No problem-specific Assessment & Plan notes found for this encounter.  Ordered Lab-work, Procedure(s), Referral(s), & Consult(s): Orders Placed This Encounter  Procedures  . DG Si Joints  . DG Lumbar Spine Complete W/Bend  . Compliance Drug Analysis, Ur  . Comp. Metabolic Panel (12)  . Magnesium  . Vitamin B12  . Sedimentation rate  . 25-Hydroxyvitamin D Lcms D2+D3  . C-reactive protein    Pharmacotherapy: Medications ordered:  No orders of the defined types were placed in this encounter.  Medications administered during this visit: Marquita Lias. Wamble had no medications administered during this visit.   Pharmacotherapy under consideration:  Opioid Analgesics: The patient was informed that there is no guarantee that she would be a candidate for opioid analgesics. The decision will be made following CDC guidelines. This decision will be based on the results of diagnostic studies, as well as Ms. Baune's risk profile.  Membrane stabilizer: To be determined at a later time Muscle relaxant: To be determined at a later time NSAID: To be determined at a later time Other analgesic(s): To be determined at a later time   Interventional therapies under consideration: Ms. Armistead was informed that there is no guarantee that she would be a candidate for interventional therapies. The decision will be based on the  results of diagnostic studies, as well as Ms. Demorest's risk profile.  Possible procedure(s): Diagnostic midline lumbar epidural steroid injection Diagnostic  bilateral lumbar facet nerve block Possible bilateral lumbar facet radiofrequency ablation   Provider-requested follow-up: Return for 2nd Visit, w/ Dr. Dossie Arbour, medical record release.  Future Appointments  Date Time Provider [place]  07/25/2018 11:15 AM Janna Arch, PT [facility] None  07/30/2018 11:15 AM Janna Arch, PT [facility] None  08/01/2018 11:45 AM Alanson Puls, PT [facility] None  08/06/2018 10:15 AM Janna Arch, PT [facility] None  08/12/2018 10:30 AM Milinda Pointer, MD [facility] None  08/20/2018 11:15 AM Janna Arch, PT [facility] None  08/22/2018 11:15 AM Janna Arch, PT [facility] None    Primary Care Physician: Konrad Saha, MD Location: [facility] Note by:  Date: 07/23/2018; Time: 4:10 PM  Pain Score Disclaimer: We use the NRS-11 scale.  This is a self-reported, subjective measurement of pain severity with only modest accuracy. It is used primarily to identify changes within a particular patient. It must be understood that outpatient pain scales are significantly less accurate that those used for research, where they can be applied under ideal controlled circumstances with minimal exposure to variables. In reality, the score is likely to be a combination of pain intensity and pain affect, where pain affect describes the degree of emotional arousal or changes in action readiness caused by the sensory experience of pain. Factors such as social and work situation, setting, emotional state, anxiety levels, expectation, and prior pain experience may influence pain perception and show large inter-individual differences that may also be affected by time variables.  Patient instructions provided during this appointment: Patient Instructions   You will have xrays completed prior to next visit with pain clinic.____________________________________________________________________________________________  Appointment Policy Summary  It is our goal and responsibility to provide the medical community with assistance in the evaluation and management of patients with chronic pain. Unfortunately our resources are limited. Because we do not have an unlimited amount of time, or available appointments, we are required to closely monitor and manage their use. The following rules exist to maximize their use:  Patient's responsibilities: 1. Punctuality:  At what time should I arrive? You should be physically present in our office 30 minutes before your scheduled appointment. Your scheduled appointment is with your assigned healthcare provider. However, it takes 5-10 minutes to be "checked-in", and another 15 minutes for the nurses to do the admission. If you arrive to our office at the time you were given for your appointment, you will end up being at least  20-25 minutes late to your appointment with the provider. 2. Tardiness:  What happens if I arrive only a few minutes after my scheduled appointment time? You will need to reschedule your appointment. The cutoff is your appointment time. This is why it is so important that you arrive at least 30 minutes before that appointment. If you have an appointment scheduled for 10:00 AM and you arrive at 10:01, you will be required to reschedule your appointment.  3. Plan ahead:  Always assume that you will encounter traffic on your way in. Plan for it. If you are dependent on a driver, make sure they understand these rules and the need to arrive early. 4. Other appointments and responsibilities:  Avoid scheduling any other appointments before or after your pain clinic appointments.  5. Be prepared:  Write down everything that you need to discuss with your healthcare provider and give this information to  the admitting nurse. Write down the medications that you will need refilled. Bring your pills and bottles (even the empty ones), to all of your appointments, except for those where a procedure is scheduled. 6. No children or pets:  Find someone to take care of them. It is not appropriate to bring them in. 7. Scheduling changes:  We request "advanced notification" of any changes or cancellations. 8. Advanced notification:  Defined as a time period of more than 24 hours prior to the originally scheduled appointment. This allows for the appointment to be offered to other patients. 9. Rescheduling:  When a visit is rescheduled, it will require the cancellation of the original appointment. For this reason they both fall within the category of "Cancellations".  10. Cancellations:  They require advanced notification. Any cancellation less than 24 hours before the  appointment will be recorded as a "No Show". 11. No Show:  Defined as an unkept appointment where the patient failed to notify or declare to the  practice their intention or inability to keep the appointment.  Corrective process for repeat offenders:  1. Tardiness: Three (3) episodes of rescheduling due to late arrivals will be recorded as one (1) "No Show". 2. Cancellation or reschedule: Three (3) cancellations or rescheduling will be recorded as one (1) "No Show". 3. "No Shows": Three (3) "No Shows" within a 12 month period will result in discharge from the practice. ____________________________________________________________________________________________   ______________________________________________________________________________________________  Specialty Pain Scale  Introduction:  There are significant differences in how pain is reported. The word pain usually refers to physical pain, but it is also a common synonym of suffering. The medical community uses a scale from 0 (zero) to 10 (ten) to report pain level. Zero (0) is described as "no pain", while ten (10) is described as "the worse pain you can imagine". The problem with this scale is that physical pain is reported along with suffering. Suffering refers to mental pain, or more often yet it refers to any unpleasant feeling, emotion or aversion associated with the perception of harm or threat of harm. It is the psychological component of pain.  Pain Specialists prefer to separate the two components. The pain scale used by this practice is the Verbal Numerical Rating Scale (VNRS-11). This scale is for the physical pain only. DO NOT INCLUDE how your pain psychologically affects you. This scale is for adults 77 years of age and older. It has 11 (eleven) levels. The 1st level is 0/10. This means: "right now, I have no pain". In the context of pain management, it also means: "right now, my physical pain is under control with the current therapy".  General Information:  The scale should reflect your current level of pain. Unless you are specifically asked for the level of your  worst pain, or your average pain. If you are asked for one of these two, then it should be understood that it is over the past 24 hours.  Levels 1 (one) through 5 (five) are described below, and can be treated as an outpatient. Ambulatory pain management facilities such as ours are more than adequate to treat these levels. Levels 6 (six) through 10 (ten) are also described below, however, these must be treated as a hospitalized patient. While levels 6 (six) and 7 (seven) may be evaluated at an urgent care facility, levels 8 (eight) through 10 (ten) constitute medical emergencies and as such, they belong in a hospital's emergency department. When having these levels (as described below), do not  come to our office. Our facility is not equipped to manage these levels. Go directly to an urgent care facility or an emergency department to be evaluated.  Definitions:  Activities of Daily Living (ADL): Activities of daily living (ADL or ADLs) is a term used in healthcare to refer to people's daily self-care activities. Health professionals often use a person's ability or inability to perform ADLs as a measurement of their functional status, particularly in regard to people post injury, with disabilities and the elderly. There are two ADL levels: Basic and Instrumental. Basic Activities of Daily Living (BADL  or BADLs) consist of self-care tasks that include: Bathing and showering; personal hygiene and grooming (including brushing/combing/styling hair); dressing; Toilet hygiene (getting to the toilet, cleaning oneself, and getting back up); eating and self-feeding (not including cooking or chewing and swallowing); functional mobility, often referred to as "transferring", as measured by the ability to walk, get in and out of bed, and get into and out of a chair; the broader definition (moving from one place to another while performing activities) is useful for people with different physical abilities who are still able  to get around independently. Basic ADLs include the things many people do when they get up in the morning and get ready to go out of the house: get out of bed, go to the toilet, bathe, dress, groom, and eat. On the average, loss of function typically follows a particular order. Hygiene is the first to go, followed by loss of toilet use and locomotion. The last to go is the ability to eat. When there is only one remaining area in which the person is independent, there is a 62.9% chance that it is eating and only a 3.5% chance that it is hygiene. Instrumental Activities of Daily Living (IADL or IADLs) are not necessary for fundamental functioning, but they let an individual live independently in a community. IADL consist of tasks that include: cleaning and maintaining the house; home establishment and maintenance; care of others (including selecting and supervising caregivers); care of pets; child rearing; managing money; managing financials (investments, etc.); meal preparation and cleanup; shopping for groceries and necessities; moving within the community; safety procedures and emergency responses; health management and maintenance (taking prescribed medications); and using the telephone or other form of communication.  Instructions:  Most patients tend to report their pain as a combination of two factors, their physical pain and their psychosocial pain. This last one is also known as "suffering" and it is reflection of how physical pain affects you socially and psychologically. From now on, report them separately.  From this point on, when asked to report your pain level, report only your physical pain. Use the following table for reference.  Pain Clinic Pain Levels (0-5/10)  Pain Level Score  Description  No Pain 0   Mild pain 1 Nagging, annoying, but does not interfere with basic activities of daily living (ADL). Patients are able to eat, bathe, get dressed, toileting (being able to get on and off the  toilet and perform personal hygiene functions), transfer (move in and out of bed or a chair without assistance), and maintain continence (able to control bladder and bowel functions). Blood pressure and heart rate are unaffected. A normal heart rate for a healthy adult ranges from 60 to 100 bpm (beats per minute).   Mild to moderate pain 2 Noticeable and distracting. Impossible to hide from other people. More frequent flare-ups. Still possible to adapt and function close to normal. It can  be very annoying and may have occasional stronger flare-ups. With discipline, patients may get used to it and adapt.   Moderate pain 3 Interferes significantly with activities of daily living (ADL). It becomes difficult to feed, bathe, get dressed, get on and off the toilet or to perform personal hygiene functions. Difficult to get in and out of bed or a chair without assistance. Very distracting. With effort, it can be ignored when deeply involved in activities.   Moderately severe pain 4 Impossible to ignore for more than a few minutes. With effort, patients may still be able to manage work or participate in some social activities. Very difficult to concentrate. Signs of autonomic nervous system discharge are evident: dilated pupils (mydriasis); mild sweating (diaphoresis); sleep interference. Heart rate becomes elevated (>115 bpm). Diastolic blood pressure (lower number) rises above 100 mmHg. Patients find relief in laying down and not moving.   Severe pain 5 Intense and extremely unpleasant. Associated with frowning face and frequent crying. Pain overwhelms the senses.  Ability to do any activity or maintain social relationships becomes significantly limited. Conversation becomes difficult. Pacing back and forth is common, as getting into a comfortable position is nearly impossible. Pain wakes you up from deep sleep. Physical signs will be obvious: pupillary dilation; increased sweating; goosebumps; brisk reflexes;  cold, clammy hands and feet; nausea, vomiting or dry heaves; loss of appetite; significant sleep disturbance with inability to fall asleep or to remain asleep. When persistent, significant weight loss is observed due to the complete loss of appetite and sleep deprivation.  Blood pressure and heart rate becomes significantly elevated. Caution: If elevated blood pressure triggers a pounding headache associated with blurred vision, then the patient should immediately seek attention at an urgent or emergency care unit, as these may be signs of an impending stroke.    Emergency Department Pain Levels (6-10/10)  Emergency Room Pain 6 Severely limiting. Requires emergency care and should not be seen or managed at an outpatient pain management facility. Communication becomes difficult and requires great effort. Assistance to reach the emergency department may be required. Facial flushing and profuse sweating along with potentially dangerous increases in heart rate and blood pressure will be evident.   Distressing pain 7 Self-care is very difficult. Assistance is required to transport, or use restroom. Assistance to reach the emergency department will be required. Tasks requiring coordination, such as bathing and getting dressed become very difficult.   Disabling pain 8 Self-care is no longer possible. At this level, pain is disabling. The individual is unable to do even the most "basic" activities such as walking, eating, bathing, dressing, transferring to a bed, or toileting. Fine motor skills are lost. It is difficult to think clearly.   Incapacitating pain 9 Pain becomes incapacitating. Thought processing is no longer possible. Difficult to remember your own name. Control of movement and coordination are lost.   The worst pain imaginable 10 At this level, most patients pass out from pain. When this level is reached, collapse of the autonomic nervous system occurs, leading to a sudden drop in blood pressure  and heart rate. This in turn results in a temporary and dramatic drop in blood flow to the brain, leading to a loss of consciousness. Fainting is one of the body's self defense mechanisms. Passing out puts the brain in a calmed state and causes it to shut down for a while, in order to begin the healing process.    Summary: 1. Refer to this scale when providing [place]  with your pain level. 2. Be accurate and careful when reporting your pain level. This will help with your care. 3. Over-reporting your pain level will lead to loss of credibility. 4. Even a level of 1/10 means that there is pain and will be treated at our facility. 5. High, inaccurate reporting will be documented as "Symptom Exaggeration", leading to loss of credibility and suspicions of possible secondary gains such as obtaining more narcotics, or wanting to appear disabled, for fraudulent reasons. 6. Only pain levels of 5 or below will be seen at our facility. 7. Pain levels of 6 and above will be sent to the Emergency Department and the appointment cancelled. ______________________________________________________________________________________________   BMI Assessment: Estimated body mass index is 35.45 kg/m as calculated from the following:   Height as of this encounter: 5' 5"  (1.651 m).   Weight as of this encounter: 213 lb (96.6 kg).  BMI interpretation table: BMI level Category Range association with higher incidence of chronic pain  <18 kg/m2 Underweight   18.5-24.9 kg/m2 Ideal body weight   25-29.9 kg/m2 Overweight Increased incidence by 20%  30-34.9 kg/m2 Obese (Class I) Increased incidence by 68%  35-39.9 kg/m2 Severe obesity (Class II) Increased incidence by 136%  >40 kg/m2 Extreme obesity (Class III) Increased incidence by 254%   BMI Readings from Last 4 Encounters:  07/23/18 35.45 kg/m  07/22/18 34.95 kg/m  07/15/18 35.83 kg/m  04/24/18 34.95 kg/m   Wt Readings from Last 4 Encounters:  07/23/18 213 lb  (96.6 kg)  07/22/18 210 lb (95.3 kg)  07/15/18 216 lb (98 kg)  04/24/18 210 lb (95.3 kg)

## 2018-07-23 NOTE — Patient Instructions (Addendum)
You will have xrays completed prior to next visit with pain clinic.____________________________________________________________________________________________  Appointment Policy Summary  It is our goal and responsibility to provide the medical community with assistance in the evaluation and management of patients with chronic pain. Unfortunately our resources are limited. Because we do not have an unlimited amount of time, or available appointments, we are required to closely monitor and manage their use. The following rules exist to maximize their use:  Patient's responsibilities: 1. Punctuality:  At what time should I arrive? You should be physically present in our office 30 minutes before your scheduled appointment. Your scheduled appointment is with your assigned healthcare provider. However, it takes 5-10 minutes to be "checked-in", and another 15 minutes for the nurses to do the admission. If you arrive to our office at the time you were given for your appointment, you will end up being at least 20-25 minutes late to your appointment with the provider. 2. Tardiness:  What happens if I arrive only a few minutes after my scheduled appointment time? You will need to reschedule your appointment. The cutoff is your appointment time. This is why it is so important that you arrive at least 30 minutes before that appointment. If you have an appointment scheduled for 10:00 AM and you arrive at 10:01, you will be required to reschedule your appointment.  3. Plan ahead:  Always assume that you will encounter traffic on your way in. Plan for it. If you are dependent on a driver, make sure they understand these rules and the need to arrive early. 4. Other appointments and responsibilities:  Avoid scheduling any other appointments before or after your pain clinic appointments.  5. Be prepared:  Write down everything that you need to discuss with your healthcare provider and give this information to the  admitting nurse. Write down the medications that you will need refilled. Bring your pills and bottles (even the empty ones), to all of your appointments, except for those where a procedure is scheduled. 6. No children or pets:  Find someone to take care of them. It is not appropriate to bring them in. 7. Scheduling changes:  We request "advanced notification" of any changes or cancellations. 8. Advanced notification:  Defined as a time period of more than 24 hours prior to the originally scheduled appointment. This allows for the appointment to be offered to other patients. 9. Rescheduling:  When a visit is rescheduled, it will require the cancellation of the original appointment. For this reason they both fall within the category of "Cancellations".  10. Cancellations:  They require advanced notification. Any cancellation less than 24 hours before the  appointment will be recorded as a "No Show". 11. No Show:  Defined as an unkept appointment where the patient failed to notify or declare to the practice their intention or inability to keep the appointment.  Corrective process for repeat offenders:  1. Tardiness: Three (3) episodes of rescheduling due to late arrivals will be recorded as one (1) "No Show". 2. Cancellation or reschedule: Three (3) cancellations or rescheduling will be recorded as one (1) "No Show". 3. "No Shows": Three (3) "No Shows" within a 12 month period will result in discharge from the practice. ____________________________________________________________________________________________   ______________________________________________________________________________________________  Specialty Pain Scale  Introduction:  There are significant differences in how pain is reported. The word pain usually refers to physical pain, but it is also a common synonym of suffering. The medical community uses a scale from 0 (zero) to 10 (ten)  to report pain level. Zero (0) is  described as "no pain", while ten (10) is described as "the worse pain you can imagine". The problem with this scale is that physical pain is reported along with suffering. Suffering refers to mental pain, or more often yet it refers to any unpleasant feeling, emotion or aversion associated with the perception of harm or threat of harm. It is the psychological component of pain.  Pain Specialists prefer to separate the two components. The pain scale used by this practice is the Verbal Numerical Rating Scale (VNRS-11). This scale is for the physical pain only. DO NOT INCLUDE how your pain psychologically affects you. This scale is for adults 40 years of age and older. It has 11 (eleven) levels. The 1st level is 0/10. This means: "right now, I have no pain". In the context of pain management, it also means: "right now, my physical pain is under control with the current therapy".  General Information:  The scale should reflect your current level of pain. Unless you are specifically asked for the level of your worst pain, or your average pain. If you are asked for one of these two, then it should be understood that it is over the past 24 hours.  Levels 1 (one) through 5 (five) are described below, and can be treated as an outpatient. Ambulatory pain management facilities such as ours are more than adequate to treat these levels. Levels 6 (six) through 10 (ten) are also described below, however, these must be treated as a hospitalized patient. While levels 6 (six) and 7 (seven) may be evaluated at an urgent care facility, levels 8 (eight) through 10 (ten) constitute medical emergencies and as such, they belong in a hospital's emergency department. When having these levels (as described below), do not come to our office. Our facility is not equipped to manage these levels. Go directly to an urgent care facility or an emergency department to be evaluated.  Definitions:  Activities of Daily Living (ADL): Activities  of daily living (ADL or ADLs) is a term used in healthcare to refer to people's daily self-care activities. Health professionals often use a person's ability or inability to perform ADLs as a measurement of their functional status, particularly in regard to people post injury, with disabilities and the elderly. There are two ADL levels: Basic and Instrumental. Basic Activities of Daily Living (BADL  or BADLs) consist of self-care tasks that include: Bathing and showering; personal hygiene and grooming (including brushing/combing/styling hair); dressing; Toilet hygiene (getting to the toilet, cleaning oneself, and getting back up); eating and self-feeding (not including cooking or chewing and swallowing); functional mobility, often referred to as "transferring", as measured by the ability to walk, get in and out of bed, and get into and out of a chair; the broader definition (moving from one place to another while performing activities) is useful for people with different physical abilities who are still able to get around independently. Basic ADLs include the things many people do when they get up in the morning and get ready to go out of the house: get out of bed, go to the toilet, bathe, dress, groom, and eat. On the average, loss of function typically follows a particular order. Hygiene is the first to go, followed by loss of toilet use and locomotion. The last to go is the ability to eat. When there is only one remaining area in which the person is independent, there is a 62.9% chance that it is eating  and only a 3.5% chance that it is hygiene. Instrumental Activities of Daily Living (IADL or IADLs) are not necessary for fundamental functioning, but they let an individual live independently in a community. IADL consist of tasks that include: cleaning and maintaining the house; home establishment and maintenance; care of others (including selecting and supervising caregivers); care of pets; child rearing;  managing money; managing financials (investments, etc.); meal preparation and cleanup; shopping for groceries and necessities; moving within the community; safety procedures and emergency responses; health management and maintenance (taking prescribed medications); and using the telephone or other form of communication.  Instructions:  Most patients tend to report their pain as a combination of two factors, their physical pain and their psychosocial pain. This last one is also known as "suffering" and it is reflection of how physical pain affects you socially and psychologically. From now on, report them separately.  From this point on, when asked to report your pain level, report only your physical pain. Use the following table for reference.  Pain Clinic Pain Levels (0-5/10)  Pain Level Score  Description  No Pain 0   Mild pain 1 Nagging, annoying, but does not interfere with basic activities of daily living (ADL). Patients are able to eat, bathe, get dressed, toileting (being able to get on and off the toilet and perform personal hygiene functions), transfer (move in and out of bed or a chair without assistance), and maintain continence (able to control bladder and bowel functions). Blood pressure and heart rate are unaffected. A normal heart rate for a healthy adult ranges from 60 to 100 bpm (beats per minute).   Mild to moderate pain 2 Noticeable and distracting. Impossible to hide from other people. More frequent flare-ups. Still possible to adapt and function close to normal. It can be very annoying and may have occasional stronger flare-ups. With discipline, patients may get used to it and adapt.   Moderate pain 3 Interferes significantly with activities of daily living (ADL). It becomes difficult to feed, bathe, get dressed, get on and off the toilet or to perform personal hygiene functions. Difficult to get in and out of bed or a chair without assistance. Very distracting. With effort, it can  be ignored when deeply involved in activities.   Moderately severe pain 4 Impossible to ignore for more than a few minutes. With effort, patients may still be able to manage work or participate in some social activities. Very difficult to concentrate. Signs of autonomic nervous system discharge are evident: dilated pupils (mydriasis); mild sweating (diaphoresis); sleep interference. Heart rate becomes elevated (>115 bpm). Diastolic blood pressure (lower number) rises above 100 mmHg. Patients find relief in laying down and not moving.   Severe pain 5 Intense and extremely unpleasant. Associated with frowning face and frequent crying. Pain overwhelms the senses.  Ability to do any activity or maintain social relationships becomes significantly limited. Conversation becomes difficult. Pacing back and forth is common, as getting into a comfortable position is nearly impossible. Pain wakes you up from deep sleep. Physical signs will be obvious: pupillary dilation; increased sweating; goosebumps; brisk reflexes; cold, clammy hands and feet; nausea, vomiting or dry heaves; loss of appetite; significant sleep disturbance with inability to fall asleep or to remain asleep. When persistent, significant weight loss is observed due to the complete loss of appetite and sleep deprivation.  Blood pressure and heart rate becomes significantly elevated. Caution: If elevated blood pressure triggers a pounding headache associated with blurred vision, then the patient should  immediately seek attention at an urgent or emergency care unit, as these may be signs of an impending stroke.    Emergency Department Pain Levels (6-10/10)  Emergency Room Pain 6 Severely limiting. Requires emergency care and should not be seen or managed at an outpatient pain management facility. Communication becomes difficult and requires great effort. Assistance to reach the emergency department may be required. Facial flushing and profuse sweating  along with potentially dangerous increases in heart rate and blood pressure will be evident.   Distressing pain 7 Self-care is very difficult. Assistance is required to transport, or use restroom. Assistance to reach the emergency department will be required. Tasks requiring coordination, such as bathing and getting dressed become very difficult.   Disabling pain 8 Self-care is no longer possible. At this level, pain is disabling. The individual is unable to do even the most "basic" activities such as walking, eating, bathing, dressing, transferring to a bed, or toileting. Fine motor skills are lost. It is difficult to think clearly.   Incapacitating pain 9 Pain becomes incapacitating. Thought processing is no longer possible. Difficult to remember your own name. Control of movement and coordination are lost.   The worst pain imaginable 10 At this level, most patients pass out from pain. When this level is reached, collapse of the autonomic nervous system occurs, leading to a sudden drop in blood pressure and heart rate. This in turn results in a temporary and dramatic drop in blood flow to the brain, leading to a loss of consciousness. Fainting is one of the body's self defense mechanisms. Passing out puts the brain in a calmed state and causes it to shut down for a while, in order to begin the healing process.    Summary: 1. Refer to this scale when providing [place] with your pain level. 2. Be accurate and careful when reporting your pain level. This will help with your care. 3. Over-reporting your pain level will lead to loss of credibility. 4. Even a level of 1/10 means that there is pain and will be treated at our facility. 5. High, inaccurate reporting will be documented as "Symptom Exaggeration", leading to loss of credibility and suspicions of possible secondary gains such as obtaining more narcotics, or wanting to appear disabled, for fraudulent reasons. 6. Only pain levels of 5 or below will  be seen at our facility. 7. Pain levels of 6 and above will be sent to the Emergency Department and the appointment cancelled. ______________________________________________________________________________________________   BMI Assessment: Estimated body mass index is 35.45 kg/m as calculated from the following:   Height as of this encounter: 5\' 5"  (1.651 m).   Weight as of this encounter: 213 lb (96.6 kg).  BMI interpretation table: BMI level Category Range association with higher incidence of chronic pain  <18 kg/m2 Underweight   18.5-24.9 kg/m2 Ideal body weight   25-29.9 kg/m2 Overweight Increased incidence by 20%  30-34.9 kg/m2 Obese (Class I) Increased incidence by 68%  35-39.9 kg/m2 Severe obesity (Class II) Increased incidence by 136%  >40 kg/m2 Extreme obesity (Class III) Increased incidence by 254%   BMI Readings from Last 4 Encounters:  07/23/18 35.45 kg/m  07/22/18 34.95 kg/m  07/15/18 35.83 kg/m  04/24/18 34.95 kg/m   Wt Readings from Last 4 Encounters:  07/23/18 213 lb (96.6 kg)  07/22/18 210 lb (95.3 kg)  07/15/18 216 lb (98 kg)  04/24/18 210 lb (95.3 kg)

## 2018-07-23 NOTE — Progress Notes (Signed)
Safety precautions to be maintained throughout the outpatient stay will include: orient to surroundings, keep bed in low position, maintain call bell within reach at all times, provide assistance with transfer out of bed and ambulation.  

## 2018-07-24 NOTE — Progress Notes (Signed)
Results were reviewed and found to be: mildly abnormal  No acute injury or pathology identified  Review would suggest interventional pain management techniques may be of benefit 

## 2018-07-25 ENCOUNTER — Ambulatory Visit: Payer: Medicare Other

## 2018-07-25 DIAGNOSIS — M545 Low back pain: Principal | ICD-10-CM

## 2018-07-25 DIAGNOSIS — M6281 Muscle weakness (generalized): Secondary | ICD-10-CM

## 2018-07-25 DIAGNOSIS — R2689 Other abnormalities of gait and mobility: Secondary | ICD-10-CM

## 2018-07-25 DIAGNOSIS — G8929 Other chronic pain: Secondary | ICD-10-CM

## 2018-07-25 DIAGNOSIS — R293 Abnormal posture: Secondary | ICD-10-CM

## 2018-07-25 NOTE — Therapy (Signed)
[place] MAIN [facility] SERVICES [address] [place], [place], [address] Phone: [phone]   Fax:  [phone]  Physical Therapy Treatment  Patient Details  Name: Susan Callahan MRN: [id number] Date of Birth: 24-Mar-1951 Referring Provider (PT): Francesco Sor   Encounter Date: 07/25/2018  PT End of Session - 07/25/18 1216    Visit Number  6    Number of Visits  8    Date for PT Re-Evaluation  07/31/18    Authorization Type  PN 6/10 begins 10/23    PT Start Time  1121    PT Stop Time  1201    PT Time Calculation (min)  40 min    Activity Tolerance  Patient tolerated treatment well    Behavior During Therapy  [facility] for tasks assessed/performed       Past Medical History:  Diagnosis Date  . COPD (chronic obstructive pulmonary disease) ([place])   . Depression 1974  . Diabetes mellitus without complication ([place])   . Hypertension   . Migraine   . 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 LUMBAR PERITONEAL SHUNT    . TYMPANOSTOMY TUBE PLACEMENT      There were no vitals filed for this visit.  Subjective Assessment - 07/25/18 1214    Subjective  Patient reports she went to pain clinic and will be going to [facility] for a second opinion due to not being sure of progressing to injections yet at this state. Has been compliant with HEP but feeling "not a happy camper" today.     Pertinent History  Pain began in 2007 and has gradually got worse and has progressed to nonstop. Patient states pain last all day long, everyday with no change in symptoms. Medication helps briefly. States she has difficulty with showering and standing. States she has to stand with her knee bent due to pulling sensation in legs. Pain begins immediately when stands and begins to walk. Pain feels better when she bends her knees when standing. Pain became constant and non stop around Dec 2018. Denies bowel or bladder changes,  unexplained weakness, or unexpected weight loss/gain. She currently goes to pulmonary rehab 3x a week, but has increased pain with treadmill walking. Will walk with cane on occasion around house especially at night due to occasional dizziness and vision impairments in medical history. States she also has difficulty with carrying groceries up steps due to low back pain. She would like to be able to walk better and have less pain to be less reliant on medication.     Limitations  Lifting;Standing;Walking;House hold activities    How long can you stand comfortably?  pain begins immediately     How long can you walk comfortably?  pain begins immediately     Patient Stated Goals  walk better, have less pain    Currently in Pain?  Yes    Pain Score  6     Pain Location  Back    Pain Orientation  Right;Left    Pain Descriptors / Indicators  Aching;Throbbing    Pain Type  Chronic pain    Pain Onset  More than a month ago    Pain Frequency  Constant         Manual Therapy  FABER and FADIR stretching 30s hold each bilateral; Hooklying rotation stretch 20s hold x 2 bilateral; Belt assisted inferior hip mobilizations, grade III, 30s/bout x 2 bouts  each; Belt assisted long axis hip distraction with oscillation x 30s bilateral; Hooklying HS stretch with DF/PF for sciatic nerve mobility x 30s bilateral; Grade I-II UPA and CPA thoracic and lumbar. Hypomobile, painful T 8 CPA and UPA. Roller to paraspinals and piriformis 6 minutes Prone hip flexor stretch 60 seconds   Ther-ex Posterior pelvic tilts x10. tactileand verbalcue for appropriate technique Supine hip flexor stretch 60 seconds each LE.   Pt educated throughout session about proper posture and technique with exercises. Improved exercise technique, movement at target joints, use of target muscles after min to mod verbal, visual, tactile cues.                             PT Education - 07/25/18 1215     Education Details  exercise technique, HEP, hip flexor stretch     Person(s) Educated  Patient    Methods  Explanation;Demonstration;Verbal cues    Comprehension  Verbalized understanding;Returned demonstration;Need further instruction       PT Short Term Goals - 07/03/18 1705      PT SHORT TERM GOAL #1   Title  Patient will be independent with completion of HEP to improve ability to complete functional tasks and functional ADLs.    Baseline  10/23: will give HEP next session    Time  2    Period  Weeks    Status  New    Target Date  07/17/18      PT SHORT TERM GOAL #2   Title  Patient will report pain score less than 4/10 on VAS to demonstrate reduction of pain levels with functional tasks.     Baseline  10/23: 6/10    Time  2    Period  Weeks    Status  New    Target Date  07/17/18        PT Long Term Goals - 07/03/18 1700      PT LONG TERM GOAL #1   Title  Patient will report pain score of <3/10 on VAS for decreased pain levels and ease with functional activities.    Baseline  10/23: 6/10    Time  4    Period  Weeks    Status  New    Target Date  07/31/18      PT LONG TERM GOAL #2   Title  Patient will be able to ambulate on treadmill for 46minutes without increase in back/leg pain demonstrating improved community ambulation and treadmill walking at pulmonary rehab    Baseline  10/23: 79minutes increases pain    Time  4    Period  Weeks    Status  New    Target Date  07/31/18      PT LONG TERM GOAL #3   Title  Patient will have decreased modified oswestry score <50% for improved ability to complete household tasks with less pain levels.     Baseline  10/23: 62%    Time  4    Period  Weeks    Status  New    Target Date  07/31/18      PT LONG TERM GOAL #4   Title  Patient will score 4+/5 on BLE MMT to demonstrate improved glute strength and ability to carry groceries with increased ease up/down stairs at home.    Baseline  10/23: 4-/5 grossly    Time  4     Period  Weeks  Status  New    Target Date  07/31/18            Plan - 07/25/18 1217    Clinical Impression Statement  Patient has increased pain and hypomobility of thoracic spine and upper lumbar region that was reduced with repeated manual. Patient reports pain was reduced from 6/10 to 2/10 by end of session. Limited length of quadriceps and hip flexor musculature improved with prolonged holds. Patient will continue to benefit from skilled physical therapy to improved strength, reduce pain levels with ADLs, and improved quality of life.     Rehab Potential  Fair    Clinical Impairments Affecting Rehab Potential  (+) motivation (-) chronicity of symptoms    PT Frequency  2x / week    PT Duration  4 weeks    PT Treatment/Interventions  ADLs/Self Care Home Management;Cryotherapy;Moist Heat;Traction;Iontophoresis 4mg /ml Dexamethasone;Ultrasound;Gait training;Stair training;Functional mobility training;Neuromuscular re-education;Balance training;Therapeutic exercise;Therapeutic activities;Patient/family education;Manual techniques;Energy conservation;Aquatic Therapy;Electrical Stimulation;Passive range of motion;Dry needling;Taping    PT Next Visit Plan  nerve glides, strengthening    PT Home Exercise Plan  see sheet    Consulted and Agree with Plan of Care  Patient       Patient will benefit from skilled therapeutic intervention in order to improve the following deficits and impairments:  Abnormal gait, Cardiopulmonary status limiting activity, Decreased activity tolerance, Decreased balance, Decreased endurance, Decreased range of motion, Difficulty walking, Decreased strength, Hypomobility, Decreased mobility, Impaired perceived functional ability, Impaired flexibility, Postural dysfunction, Obesity, Improper body mechanics, Pain  Visit Diagnosis: Chronic low back pain, unspecified back pain laterality, unspecified whether sciatica present  Abnormal posture  Muscle weakness  (generalized)  Other abnormalities of gait and mobility     Problem List Patient Active Problem List   Diagnosis Date Noted  . Chronic pain of both lower extremities (Primary Area of Pain) (L>R) 07/23/2018  . Chronic bilateral low back pain with bilateral sciatica (Secondary Area of Pain) (L>R) 07/23/2018  . Chronic pain syndrome 07/23/2018  . Pharmacologic therapy 07/23/2018  . Disorder of skeletal system 07/23/2018  . Problems influencing health status 07/23/2018  . Chronic sacroiliac joint pain [facility] Area of Pain) (L>R) 07/23/2018  . Status post lumboperitoneal shunt placement 02/26/2018  . Primary osteoarthritis of first carpometacarpal joint of left hand 05/04/2017  . Tympanic membrane perforation 05/30/2016  . PMB (postmenopausal bleeding) 05/26/2016  . Rash 12/27/2015  . 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 Plantar fasciitis of left foot 05/26/2015  . Spondylosis of lumbar region without myelopathy or radiculopathy 05/26/2015  . Encounter for screening mammogram for malignant neoplasm of breast 12/02/2014  . Goiter 12/02/2014  . Idiopathic chronic gout of right foot 07/06/2014  . Biceps tendonitis on right 05/19/2014  . Right shoulder pain 05/19/2014  . Facial weakness 05/05/2014  . Post herpetic neuralgia 05/05/2014  . Chronic dysfunction of left eustachian tube 03/24/2014  . Osteopenia 03/13/2014  . Family history of colon cancer 12/02/2013  . Disequilibrium syndrome 08/26/2013  . Diastasis recti 04/08/2013  . Facial nerve paresis 07/31/2012  . Blepharospasm 05/08/2012  . 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 Obesity 03/28/2012  . Hemoptysis 12/22/2011  . Neuropathic postherpetic trigeminal neuralgia 12/12/2011  . Allergic rhinitis, mild 10/12/2011  . Chronic bronchitis ([place]) 06/05/2011  . Essential hypertension 05/24/2011  . Type 2 diabetes mellitus without  complications ([place]) 86/57/8469  . Pulmonary nodules 05/05/2011   Janna Arch, PT, DPT  07/25/2018, 12:18 PM  [place] MAIN [facility] SERVICES [address] [place], [place], [address] Phone: [phone]   Fax:  [phone]  Name: Susan Callahan MRN: [id number] Date of Birth: 07/10/1951

## 2018-07-27 LAB — COMP. METABOLIC PANEL (12)
A/G RATIO: 2.2 (ref 1.2–2.2)
AST: 17 IU/L (ref 0–40)
Albumin: 4.7 g/dL (ref 3.6–4.8)
Alkaline Phosphatase: 80 IU/L (ref 39–117)
BILIRUBIN TOTAL: 0.2 mg/dL (ref 0.0–1.2)
BUN / CREAT RATIO: 20 (ref 12–28)
BUN: 21 mg/dL (ref 8–27)
CHLORIDE: 98 mmol/L (ref 96–106)
Calcium: 9.4 mg/dL (ref 8.7–10.3)
Creatinine, Ser: 1.05 mg/dL — ABNORMAL HIGH (ref 0.57–1.00)
GFR calc non Af Amer: 55 mL/min/{1.73_m2} — ABNORMAL LOW (ref >59)
GFR, EST AFRICAN AMERICAN: 64 mL/min/{1.73_m2} (ref >59)
GLOBULIN, TOTAL: 2.1 g/dL (ref 1.5–4.5)
Glucose: 110 mg/dL — ABNORMAL HIGH (ref 65–99)
POTASSIUM: 4.7 mmol/L (ref 3.5–5.2)
SODIUM: 135 mmol/L (ref 134–144)
TOTAL PROTEIN: 6.8 g/dL (ref 6.0–8.5)

## 2018-07-27 LAB — 25-HYDROXY VITAMIN D LCMS D2+D3
25-Hydroxy, Vitamin D-2: <1 ng/mL
25-Hydroxy, Vitamin D-3: 36 ng/mL
25-Hydroxy, Vitamin D: 36 ng/mL

## 2018-07-27 LAB — C-REACTIVE PROTEIN: CRP: 23 mg/L — AB (ref 0–10)

## 2018-07-27 LAB — MAGNESIUM: MAGNESIUM: 2.6 mg/dL — AB (ref 1.6–2.3)

## 2018-07-27 LAB — SEDIMENTATION RATE: Sed Rate: 43 mm/hr — ABNORMAL HIGH (ref 0–40)

## 2018-07-27 LAB — COMPLIANCE DRUG ANALYSIS, UR

## 2018-07-27 LAB — VITAMIN B12: Vitamin B-12: 603 pg/mL (ref 232–1245)

## 2018-07-30 ENCOUNTER — Ambulatory Visit: Payer: Medicare Other

## 2018-07-30 DIAGNOSIS — R293 Abnormal posture: Secondary | ICD-10-CM

## 2018-07-30 DIAGNOSIS — M6281 Muscle weakness (generalized): Secondary | ICD-10-CM

## 2018-07-30 DIAGNOSIS — R2689 Other abnormalities of gait and mobility: Secondary | ICD-10-CM

## 2018-07-30 DIAGNOSIS — G8929 Other chronic pain: Secondary | ICD-10-CM

## 2018-07-30 DIAGNOSIS — M545 Low back pain, unspecified: Secondary | ICD-10-CM

## 2018-07-30 NOTE — Therapy (Addendum)
[place] MAIN [facility] SERVICES [address] [place], [place], [address] Phone: [phone]   Fax:  [phone]  Physical Therapy Treatment Physical Therapy Progress Note (Recert)   Dates of reporting period  07/03/18   to   07/30/18    Patient Details  Name: Susan Callahan MRN: [id number] Date of Birth: 11-25-1950 Referring Provider (PT): Francesco Sor   Encounter Date: 07/30/2018  PT End of Session - 07/30/18 1217    Visit Number  7    Number of Visits  16    Date for PT Re-Evaluation  07/31/18    Authorization Type  PN 7/10 begins 10/23 (next 1/10 PN 11/19)    PT Start Time  1125    PT Stop Time  1203    PT Time Calculation (min)  38 min    Activity Tolerance  Patient tolerated treatment well    Behavior During Therapy  [facility] for tasks assessed/performed       Past Medical History:  Diagnosis Date  . COPD (chronic obstructive pulmonary disease) ([place])   . Depression 1974  . Diabetes mellitus without complication ([place])   . Hypertension   . Migraine   . 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 LUMBAR PERITONEAL SHUNT    . TYMPANOSTOMY TUBE PLACEMENT      There were no vitals filed for this visit.  Subjective Assessment - 07/30/18 1129    Subjective  Patient states she has appointment with spine doctor at [facility] tomorrow. States her back is her usual pain. States her back hurts to walk and hurts to stand while doing the dishes. States she does think PT is helping and she is able to move better. Reports being able to make bed this morning without having to stop due to pain levels. Reports HEP compliance.     Pertinent History  Pain began in 2007 and has gradually got worse and has progressed to nonstop. Patient states pain last all day long, everyday with no change in symptoms. Medication helps briefly. States she has difficulty with showering and standing. States she has to  stand with her knee bent due to pulling sensation in legs. Pain begins immediately when stands and begins to walk. Pain feels better when she bends her knees when standing. Pain became constant and non stop around Dec 2018. Denies bowel or bladder changes, unexplained weakness, or unexpected weight loss/gain. She currently goes to pulmonary rehab 3x a week, but has increased pain with treadmill walking. Will walk with cane on occasion around house especially at night due to occasional dizziness and vision impairments in medical history. States she also has difficulty with carrying groceries up steps due to low back pain. She would like to be able to walk better and have less pain to be less reliant on medication.     Limitations  Lifting;Standing;Walking;House hold activities    How long can you stand comfortably?  pain begins immediately     How long can you walk comfortably?  pain begins immediately     Patient Stated Goals  walk better, have less pain    Currently in Pain?  Yes    Pain Score  5     Pain Location  Back    Pain Orientation  Right;Left    Pain Descriptors / Indicators  Aching;Throbbing    Pain Type  Chronic pain    Pain Onset  More than  a month ago        Recert/ Progress note  10 minutes on treadmill without pain- 8 minutes per patient report MODI- 42% LE strength- 4/5 grossly; 4-/5 hamstrings Worse pain score-8/10 per patient report   Manual Therapy   Hooklying HS stretch with DF/PF for sciatic nerve mobility 2x 30s bilateral; CPA UPA T7-L5 grade II for pain relief and improved mobility.  R/L inferior mobilization to SIJ with mild pain reproduction     Ther-ex Supine self sciatic nerve glides with towel x20. Instructed on proper form and technique with patient demonstration and added to HEP     Access Code: [email or web address]  URL: [email or web address]  Date: 07/30/2018  Prepared by: Janna Arch   Exercises  Supine Sciatic Nerve Glide - 20 reps - 2  sets - 1x daily - 7x weekly  Patient's condition has the potential to improve in response to therapy. Maximum improvement is yet to be obtained. The anticipated improvement is attainable and reasonable in a generally predictable time.  Patient reports physical therapy is helping and she was able to make bed this morning without having to stop in middle.                          PT Education - 07/30/18 1216    Education Details  exercise technique, HEP, aquatic therapy, goals    Person(s) Educated  Patient    Methods  Explanation;Demonstration;Verbal cues    Comprehension  Verbalized understanding;Returned demonstration;Need further instruction       PT Short Term Goals - 07/30/18 1137      PT SHORT TERM GOAL #1   Title  Patient will be independent with completion of HEP to improve ability to complete functional tasks and functional ADLs.    Baseline  10/23: will give HEP next session 11/19: HEP compliance    Time  2    Period  Weeks    Status  Achieved    Target Date  08/27/18      PT SHORT TERM GOAL #2   Title  Patient will report pain score less than 4/10 on VAS to demonstrate reduction of pain levels with functional tasks.     Baseline  10/23: 6/10 11/19: 5/10    Time  2    Period  Weeks    Status  Partially Met    Target Date  08/27/18        PT Long Term Goals - 07/30/18 1138      PT LONG TERM GOAL #1   Title  Patient will report pain score of <3/10 on VAS for decreased pain levels and ease with functional activities.    Baseline  10/23: 6/10 11/19: 5/10    Time  4    Period  Weeks    Status  Partially Met    Target Date  08/27/18      PT LONG TERM GOAL #2   Title  Patient will be able to ambulate on treadmill for 60mnutes without increase in back/leg pain demonstrating improved community ambulation and treadmill walking at pulmonary rehab    Baseline  10/23: 019mutes increases pain 11/19: [id number]    Time  4    Period  Weeks    Status   Partially Met    Target Date  08/27/18      PT LONG TERM GOAL #3   Title  Patient will have decreased modified oswestry score <50% for improved ability to complete  household tasks with less pain levels.     Baseline  10/23: 62% 11/19: 42%    Time  4    Period  Weeks    Status  Achieved    Target Date  08/27/18      PT LONG TERM GOAL #4   Title  Patient will score 4+/5 on BLE MMT to demonstrate improved glute strength and ability to carry groceries with increased ease up/down stairs at home.    Baseline  10/23: 4-/5 grossly 11/19: 4/5 grossly; hamstrings 4-/5    Time  4    Period  Weeks    Status  Partially Met    Target Date  08/27/18      PT LONG TERM GOAL #5   Title  Patient will have decreased modified oswestry score <35% for improved ability to complete household tasks with less pain levels.     Baseline  11/19: 42%    Time  4    Period  Weeks    Status  New    Target Date  08/27/18            Plan - 07/30/18 1218    Clinical Impression Statement  Patient continues to make progress towards all goals. Patient had improved MODI score (62%-42%) demonstrating improved perceived function with household and functional activities. Patient is also able to ambulate longer length of time on treadmill during pulmonary rehab with less pain however still remains limited and has to flex trunk due to low back and leg symptoms. Patient still has days of increased pain levels but are less frequent since beginning therapy. Patient expressed desire of attempting aquatic therapy for further LE strengthening and improvement in low back pain levels. Patient's condition has the potential to improve in response to therapy. Maximum improvement is yet to be obtained. The anticipated improvement is attainable and reasonable in a generally predictable time.  Patient will continue to benefit from skilled physical therapy to improve strength, reduce pain levels with ADLs, and improve quality of life.      Rehab Potential  Fair    Clinical Impairments Affecting Rehab Potential  (+) motivation (-) chronicity of symptoms    PT Frequency  2x / week    PT Duration  4 weeks    PT Treatment/Interventions  ADLs/Self Care Home Management;Cryotherapy;Moist Heat;Traction;Iontophoresis 40m/ml Dexamethasone;Ultrasound;Gait training;Stair training;Functional mobility training;Neuromuscular re-education;Balance training;Therapeutic exercise;Therapeutic activities;Patient/family education;Manual techniques;Energy conservation;Aquatic Therapy;Electrical Stimulation;Passive range of motion;Dry needling;Taping    PT Next Visit Plan  nerve glides, strengthening    PT Home Exercise Plan  see sheet    Consulted and Agree with Plan of Care  Patient       Patient will benefit from skilled therapeutic intervention in order to improve the following deficits and impairments:  Abnormal gait, Cardiopulmonary status limiting activity, Decreased activity tolerance, Decreased balance, Decreased endurance, Decreased range of motion, Difficulty walking, Decreased strength, Hypomobility, Decreased mobility, Impaired perceived functional ability, Impaired flexibility, Postural dysfunction, Obesity, Improper body mechanics, Pain  Visit Diagnosis: Chronic low back pain, unspecified back pain laterality, unspecified whether sciatica present  Abnormal posture  Muscle weakness (generalized)  Other abnormalities of gait and mobility     Problem List Patient Active Problem List   Diagnosis Date Noted  . Chronic pain of both lower extremities (Primary Area of Pain) (L>R) 07/23/2018  . Chronic bilateral low back pain with bilateral sciatica (Secondary Area of Pain) (L>R) 07/23/2018  . Chronic pain syndrome 07/23/2018  . Pharmacologic therapy 07/23/2018  .  Disorder of skeletal system 07/23/2018  . Problems influencing health status 07/23/2018  . Chronic sacroiliac joint pain [facility] Area of Pain) (L>R) 07/23/2018  . Status  post lumboperitoneal shunt placement 02/26/2018  . Primary osteoarthritis of first carpometacarpal joint of left hand 05/04/2017  . Tympanic membrane perforation 05/30/2016  . PMB (postmenopausal bleeding) 05/26/2016  . Rash 12/27/2015  . 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 Plantar fasciitis of left foot 05/26/2015  . Spondylosis of lumbar region without myelopathy or radiculopathy 05/26/2015  . Encounter for screening mammogram for malignant neoplasm of breast 12/02/2014  . Goiter 12/02/2014  . Idiopathic chronic gout of right foot 07/06/2014  . Biceps tendonitis on right 05/19/2014  . Right shoulder pain 05/19/2014  . Facial weakness 05/05/2014  . Post herpetic neuralgia 05/05/2014  . Chronic dysfunction of left eustachian tube 03/24/2014  . Osteopenia 03/13/2014  . Family history of colon cancer 12/02/2013  . Disequilibrium syndrome 08/26/2013  . Diastasis recti 04/08/2013  . Facial nerve paresis 07/31/2012  . Blepharospasm 05/08/2012  . 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 Obesity 03/28/2012  . Hemoptysis 12/22/2011  . Neuropathic postherpetic trigeminal neuralgia 12/12/2011  . Allergic rhinitis, mild 10/12/2011  . Chronic bronchitis ([place]) 06/05/2011  . Essential hypertension 05/24/2011  . Type 2 diabetes mellitus without complications ([place]) 75/33/9179  . Pulmonary nodules 05/05/2011   Erick Blinks, SPT  This entire session was performed under direct supervision and direction of a licensed therapist/therapist assistant . I have personally read, edited and approve of the note as written.  Janna Arch, PT, DPT   07/30/2018, 12:25 PM  [place] MAIN [facility] SERVICES [address] [place], [place], [address] Phone: [phone]   Fax:  [phone]  Name: Audrey Thull MRN: [id number] Date of Birth: 08-11-1951

## 2018-08-01 ENCOUNTER — Ambulatory Visit: Payer: Medicare Other

## 2018-08-06 ENCOUNTER — Ambulatory Visit: Payer: Medicare Other

## 2018-08-06 DIAGNOSIS — M545 Low back pain, unspecified: Secondary | ICD-10-CM

## 2018-08-06 DIAGNOSIS — M6281 Muscle weakness (generalized): Secondary | ICD-10-CM

## 2018-08-06 DIAGNOSIS — G8929 Other chronic pain: Secondary | ICD-10-CM

## 2018-08-06 DIAGNOSIS — R2689 Other abnormalities of gait and mobility: Secondary | ICD-10-CM

## 2018-08-06 DIAGNOSIS — R293 Abnormal posture: Secondary | ICD-10-CM

## 2018-08-06 NOTE — Patient Instructions (Signed)
Access Code: [email or web address]  URL: [email or web address]  Date: 08/06/2018  Prepared by: Janna Arch   Exercises  Supine Transversus Abdominis Bracing with Leg Extension - 10 reps - 2 sets - 3 hold - 1x daily - 7x weekly

## 2018-08-06 NOTE — Therapy (Addendum)
[place] MAIN [facility] SERVICES [address] [place], [place], [address] Phone: [phone]   Fax:  [phone]  Physical Therapy Treatment  Patient Details  Name: Susan Callahan MRN: [id number] Date of Birth: 1951/01/01 Referring Provider (PT): Francesco Sor   Encounter Date: 08/06/2018  PT End of Session - 08/06/18 1106    Visit Number  8    Number of Visits  16    Date for PT Re-Evaluation  08/27/18    Authorization Type  1/10 PN 11/19    PT Start Time  1016    PT Stop Time  1102    PT Time Calculation (min)  46 min    Activity Tolerance  Patient tolerated treatment well    Behavior During Therapy  [facility] for tasks assessed/performed       Past Medical History:  Diagnosis Date  . COPD (chronic obstructive pulmonary disease) ([place])   . Depression 1974  . Diabetes mellitus without complication ([place])   . Hypertension   . Migraine   . 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 LUMBAR PERITONEAL SHUNT    . TYMPANOSTOMY TUBE PLACEMENT      There were no vitals filed for this visit.  Subjective Assessment - 08/06/18 1018    Subjective  Patient states the back pain is coming and going. States she has a 40lb and 50lb bag in her car but is giving her back a break before she carries them in. States getting bags into car went better than she expected. States she pulls a cart up steps to get groceries into house and this causes pain. States she is looking into getting ramp. Reports her wound is from a bug bite and doctor did not see a problem with beginning pool therapy    Pertinent History  Pain began in 2007 and has gradually got worse and has progressed to nonstop. Patient states pain last all day long, everyday with no change in symptoms. Medication helps briefly. States she has difficulty with showering and standing. States she has to stand with her knee bent due to pulling sensation in  legs. Pain begins immediately when stands and begins to walk. Pain feels better when she bends her knees when standing. Pain became constant and non stop around Dec 2018. Denies bowel or bladder changes, unexplained weakness, or unexpected weight loss/gain. She currently goes to pulmonary rehab 3x a week, but has increased pain with treadmill walking. Will walk with cane on occasion around house especially at night due to occasional dizziness and vision impairments in medical history. States she also has difficulty with carrying groceries up steps due to low back pain. She would like to be able to walk better and have less pain to be less reliant on medication.     Limitations  Lifting;Standing;Walking;House hold activities    How long can you stand comfortably?  pain begins immediately     How long can you walk comfortably?  pain begins immediately     Patient Stated Goals  walk better, have less pain    Currently in Pain?  Yes    Pain Score  3     Pain Location  Back    Pain Orientation  Lower    Pain Descriptors / Indicators  Aching;Throbbing    Pain Type  Chronic pain    Pain Onset  More than a month ago  Manual Therapy  L/R single knee to chest stretch x 30s, ; Hooklying HS stretch with DF/PF for sciatic nerve mobility x20 bilateral; CPA UPA T7-L5 grade II for pain relief and improved mobility. L5 CPA most concordant,  R/L inferior mobilization of SIJ- concordant pain with R inferior mobilization     Ther-ex Abduction with GTB in hooklying 3 second holds x 10; cues to engage core Marches x 10 each LE with TrA activation. Verbal cues to complete slowly. SLR with opposite LE in hooklying x10 each LE. Cues to engage core Bridges x10.  Verbal cues for exercise technique Seated sciatic nerve glides x10 each LE. Visual and verbal cues for technique  Pt educated throughout session about proper posture and technique with exercises.    Reassessd pain while sitting and ambulating at  end of session. Pain decreased from 3/10 to 0/10 in back while ambulating with mild soreness in LLE. 0/10 pain while sitting.      Access Code: [email or web address]  URL: [email or web address]  Date: 08/06/2018  Prepared by: Janna Arch   Exercises  Supine Transversus Abdominis Bracing with Leg Extension - 10 reps - 2 sets - 3 hold - 1x daily - 7x weekly                     PT Education - 08/06/18 1106    Education Details  exercise technique, HEP, core engagment     Person(s) Educated  Patient    Methods  Explanation;Demonstration;Verbal cues;Handout    Comprehension  Verbalized understanding;Returned demonstration;Need further instruction       PT Short Term Goals - 07/30/18 1137      PT SHORT TERM GOAL #1   Title  Patient will be independent with completion of HEP to improve ability to complete functional tasks and functional ADLs.    Baseline  10/23: will give HEP next session 11/19: HEP compliance    Time  2    Period  Weeks    Status  Achieved    Target Date  --      PT SHORT TERM GOAL #2   Title  Patient will report pain score less than 4/10 on VAS to demonstrate reduction of pain levels with functional tasks.     Baseline  10/23: 6/10 11/19: 5/10    Time  2    Period  Weeks    Status  Partially Met    Target Date  08/13/18        PT Long Term Goals - 07/30/18 1138      PT LONG TERM GOAL #1   Title  Patient will report pain score of <3/10 on VAS for decreased pain levels and ease with functional activities.    Baseline  10/23: 6/10 11/19: 5/10    Time  4    Period  Weeks    Status  Partially Met    Target Date  08/27/18      PT LONG TERM GOAL #2   Title  Patient will be able to ambulate on treadmill for 743mnutes without increase in back/leg pain demonstrating improved community ambulation and treadmill walking at pulmonary rehab    Baseline  10/23: 071mutes increases pain 11/19: 43m44mtes    Time  4    Period  Weeks    Status   Partially Met    Target Date  08/27/18      PT LONG TERM GOAL #3   Title  Patient will have decreased modified oswestry score <50%  for improved ability to complete household tasks with less pain levels.     Baseline  10/23: 62% 11/19: 42%    Time  4    Period  Weeks    Status  Achieved    Target Date  08/27/18      PT LONG TERM GOAL #4   Title  Patient will score 4+/5 on BLE MMT to demonstrate improved glute strength and ability to carry groceries with increased ease up/down stairs at home.    Baseline  10/23: 4-/5 grossly 11/19: 4/5 grossly; hamstrings 4-/5    Time  4    Period  Weeks    Status  Partially Met    Target Date  08/27/18      PT LONG TERM GOAL #5   Title  Patient will have decreased modified oswestry score <35% for improved ability to complete household tasks with less pain levels.     Baseline  11/19: 42%    Time  4    Period  Weeks    Status  New    Target Date  08/27/18            Plan - 08/06/18 1112    Clinical Impression Statement  Patient returns to therapy with decrease pain levels and centrailzation when compared to previous session (3/10 pain proximal to knees). Progressed core stabilization and strengthening exercises which were tolerated well. Patient demonstrated improved bridge technique and strength with no production of muscle spasms during completion. Patient educated about engaging core during home activities and lift mechanics should be examined in a future session. Patient will continue to benefit from skilled physical therapy to improve strength, reduce pain levels with ADLs, and improved quality of life.     Rehab Potential  Fair    Clinical Impairments Affecting Rehab Potential  (+) motivation (-) chronicity of symptoms    PT Frequency  2x / week    PT Duration  4 weeks    PT Treatment/Interventions  ADLs/Self Care Home Management;Cryotherapy;Moist Heat;Traction;Iontophoresis 14m/ml Dexamethasone;Ultrasound;Gait training;Stair  training;Functional mobility training;Neuromuscular re-education;Balance training;Therapeutic exercise;Therapeutic activities;Patient/family education;Manual techniques;Energy conservation;Aquatic Therapy;Electrical Stimulation;Passive range of motion;Dry needling;Taping    PT Next Visit Plan  nerve glides, strengthening    PT Home Exercise Plan  see sheet    Consulted and Agree with Plan of Care  Patient       Patient will benefit from skilled therapeutic intervention in order to improve the following deficits and impairments:  Abnormal gait, Cardiopulmonary status limiting activity, Decreased activity tolerance, Decreased balance, Decreased endurance, Decreased range of motion, Difficulty walking, Decreased strength, Hypomobility, Decreased mobility, Impaired perceived functional ability, Impaired flexibility, Postural dysfunction, Obesity, Improper body mechanics, Pain  Visit Diagnosis: Chronic low back pain, unspecified back pain laterality, unspecified whether sciatica present  Abnormal posture  Muscle weakness (generalized)  Other abnormalities of gait and mobility     Problem List Patient Active Problem List   Diagnosis Date Noted  . Chronic pain of both lower extremities (Primary Area of Pain) (L>R) 07/23/2018  . Chronic bilateral low back pain with bilateral sciatica (Secondary Area of Pain) (L>R) 07/23/2018  . Chronic pain syndrome 07/23/2018  . Pharmacologic therapy 07/23/2018  . Disorder of skeletal system 07/23/2018  . Problems influencing health status 07/23/2018  . Chronic [facility]iliac joint pain ([facility]) (L>R) 07/23/2018  . Status post lumboperitoneal shunt placement 02/26/2018  . Primary osteoarthritis of first carpometacarpal joint of left hand 05/04/2017  . Tympanic membrane perforation 05/30/2016  . PMB (  postmenopausal bleeding) 05/26/2016  . Rash 12/27/2015  . 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 Plantar  fasciitis of left foot 05/26/2015  . Spondylosis of lumbar region without myelopathy or radiculopathy 05/26/2015  . Encounter for screening mammogram for malignant neoplasm of breast 12/02/2014  . Goiter 12/02/2014  . Idiopathic chronic gout of right foot 07/06/2014  . Biceps tendonitis on right 05/19/2014  . Right shoulder pain 05/19/2014  . Facial weakness 05/05/2014  . Post herpetic neuralgia 05/05/2014  . Chronic dysfunction of left eustachian tube 03/24/2014  . Osteopenia 03/13/2014  . Family history of colon cancer 12/02/2013  . Disequilibrium syndrome 08/26/2013  . Diastasis recti 04/08/2013  . Facial nerve paresis 07/31/2012  . Blepharospasm 05/08/2012  . 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 Obesity 03/28/2012  . Hemoptysis 12/22/2011  . Neuropathic postherpetic trigeminal neuralgia 12/12/2011  . Allergic rhinitis, mild 10/12/2011  . Chronic bronchitis ([place]) 06/05/2011  . Essential hypertension 05/24/2011  . Type 2 diabetes mellitus without complications ([place]) 39/43/2003  . Pulmonary nodules 05/05/2011   Erick Blinks, SPT  This entire session was performed under direct supervision and direction of a licensed therapist/therapist assistant . I have personally read, edited and approve of the note as written.  Janna Arch, PT, DPT   08/06/2018, 2:36 PM  [place] MAIN [facility] SERVICES [address] [place], [place], [address] Phone: [phone]   Fax:  [phone]  Name: Jiselle Sheu MRN: [id number] Date of Birth: 1951/01/06

## 2018-08-07 NOTE — Progress Notes (Signed)
Patient's Name: Susan Callahan  MRN: [id number]  Referring Provider: Konrad Saha, MD  DOB: 05/30/1951  PCP: Konrad Saha, MD  DOS: 08/12/2018  Note by: Gaspar Cola, MD  Service setting: Ambulatory outpatient  Specialty: Interventional Pain Management  Location: [facility] (AMB) Pain Management Facility    Patient type: Established   Primary Reason(s) for Visit: Encounter for evaluation before starting new chronic pain management plan of care (Level of risk: moderate) CC: Back Pain (low) and Leg Pain (bilateral and posterior to just above ankle)  HPI  Susan Callahan is a 67 y.o. year old, female patient, who comes today for a follow-up evaluation to review the test results and decide on a treatment plan. 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; Neuropathic postherpetic trigeminal neuralgia; Obesity; Osteopenia; Plantar fasciitis of foot (Left); PMB (postmenopausal bleeding); Post herpetic neuralgia; Osteoarthritis of first carpometacarpal joint of hand (Left); Pulmonary nodules; Rash; Shoulder pain (Right); Spondylosis of lumbar region without myelopathy or radiculopathy; Status post lumboperitoneal shunt placement; Tympanic membrane perforation; Type 2 diabetes mellitus without complications ([place]); Chronic lower extremity pain (Primary Area of Pain) (Bilateral) (L>R); Chronic low back pain (Secondary Area of Pain) (Bilateral) (L>R) w/ sciatica (Bilateral); Chronic pain syndrome; Pharmacologic  therapy; Disorder of skeletal system; Problems influencing health status; Chronic sacroiliac joint pain (Tertiary Area of Pain) (Bilateral) (L>R); Elevated C-reactive protein (CRP); Elevated sed rate; Lumbar facet joint syndrome; Back pain with left-sided radiculopathy; DDD (degenerative disc disease), lumbar; and Elevated rheumatoid factor on their problem list. Her primarily concern today is the Back Pain (low) and Leg Pain (bilateral and posterior to just above ankle)  Pain Assessment: Location: Lower Back Radiating: legs Onset: More than a month ago Duration: Chronic pain Quality: Aching, Throbbing, Burning Severity: 6 /10 (subjective, self-reported pain score)  Note: Reported level is inconsistent with clinical observations. Clinically the patient looks like a 3/10 A 3/10 is viewed as "Moderate" and described as significantly interfering with activities of daily living (ADL). It becomes difficult to feed, bathe, get dressed, get on and off the toilet or to perform personal hygiene functions. Difficult to get in and out of bed or a chair without assistance. Very distracting. With effort, it can be ignored when deeply involved in activities. Information on the proper use of the pain scale provided to the patient today. When using our objective Pain Scale, levels between 6 and 10/10 are said to belong in an emergency room, as it progressively worsens from a 6/10, described as severely limiting, requiring emergency care not usually available at an outpatient pain management facility. At a 6/10 level, communication becomes difficult and requires great effort. Assistance to reach the emergency department may be required. Facial flushing and profuse sweating along with potentially dangerous increases in heart rate and blood pressure will be evident. Timing: Intermittent Modifying factors: sitting, rest,medications BP: 138/66  HR: 69  Susan Callahan comes in today for a 67 follow-up visit for a follow-up visit after her initial  evaluation on 07/23/2018. Today we went over the results of her tests. These were explained in "Layman's terms". During today's appointment we went over my diagnostic impression, as  well as the proposed treatment plan.  According to the patient her primary area of pain is in her legs (B).  She feels that the leg pain is about equal.  She admits that is a constant pain.  She denies any numbness tingling or weakness.  The pain goes down the back of her leg to the ankle.  She denies a nerve conduction study.  Second area of pain is in her lower back (B).  She admits that it does go across her back.  She denies any previous surgery.  She did have a LP shunt placed in 2011 secondary to shingles.  She was diagnosed with increased intracranial pressure with increased dizziness.  She admits that this has been effective.  She has had epidural steroid injections started in 2010 in [place] by Dr. Annye Asa.  She admits that the first 1 was effective however she was never able to get that same relief.  She has recently seen [facility] and was referred to physical therapy.  She is currently doing physical therapy.  She admits that it is helping but it takes too long to work.  She has not had any recent images.  In considering the treatment plan options, Susan Callahan was reminded that I no longer take patients for medication management only. I asked her to let me know if she had no intention of taking advantage of the interventional therapies, so that we could make arrangements to provide this space to someone interested. I also made it clear that undergoing interventional therapies for the purpose of getting pain medications is very inappropriate on the part of a patient, and it will not be tolerated in this practice. This type of behavior would suggest true addiction and therefore it requires referral to an addiction specialist.   Further details on both, my assessment(s), as well as the  proposed treatment plan, please see below.  Controlled Substance Pharmacotherapy Assessment REMS (Risk Evaluation and Mitigation Strategy)  Analgesic: Tramadol 50 mg 1 tablet twice daily (fill date 05/21/2018) tramadol 100 mg/day Highest recorded MME/day: 37.5 mg/day MME/day: 10 mg/day Pill Count: None expected due to no prior prescriptions written by our practice. Hart Rochester, RN  08/12/2018 12:00 PM  Sign at close encounter Nursing Pain Medication Assessment:  Safety precautions to be maintained throughout the outpatient stay will include: orient to surroundings, keep bed in low position, maintain call bell within reach at all times, provide assistance with transfer out of bed and ambulation.  Medication Inspection Compliance: Pill count conducted under aseptic conditions, in front of the patient. Neither the pills nor the bottle was removed from the patient's sight at any time. Once count was completed pills were immediately returned to the patient in their original bottle.  Medication: Tramadol (Ultram) Pill/Patch Count: 38 of 60 pills remain Pill/Patch Appearance: Markings consistent with prescribed medication Bottle Appearance: Standard pharmacy container. Clearly labeled. Filled Date: 10 / 19 / 2019 Last Medication intake:  Today   Pharmacokinetics: Liberation and absorption (onset of action): WNL Distribution (time to peak effect): WNL Metabolism and excretion (duration of action): WNL         Pharmacodynamics: Desired effects: Analgesia: Ms. Notte reports >50% benefit. Functional ability: Patient reports that medication allows her to accomplish basic ADLs Clinically meaningful improvement in function (CMIF): Sustained CMIF goals met Perceived effectiveness: Described as relatively effective, allowing for increase in activities of daily living (ADL) Undesirable effects: Side-effects or Adverse reactions: None reported Monitoring: [place] PMP:  Online review of the past  41-monthperiod previously conducted. Not applicable at this point since we have not taken over the patient's medication management yet. List of other Serum/Urine Drug Screening Test(s):  No results found. List of all UDS test(s) done:  Lab Results  Component Value Date   SUMMARY FINAL 07/23/2018   Last UDS on record: Summary  Date Value Ref Range Status  07/23/2018 FINAL  Final    Comment:    ==================================================================== TOXASSURE COMP DRUG ANALYSIS,UR ==================================================================== Test                             Result       Flag       Units Drug Present and Declared for Prescription Verification   Tramadol                       >2463        EXPECTED   ng/mg creat   O-Desmethyltramadol            >2463        EXPECTED   ng/mg creat   N-Desmethyltramadol            >2463        EXPECTED   ng/mg creat    Source of tramadol is a prescription medication.    O-desmethyltramadol and N-desmethyltramadol are expected    metabolites of tramadol.   Gabapentin                     PRESENT      EXPECTED   Amitriptyline                  PRESENT      EXPECTED   Nortriptyline                  PRESENT      EXPECTED    Nortriptyline is an expected metabolite of amitriptyline.   Verapamil                      PRESENT      EXPECTED Drug Present not Declared for Prescription Verification   Acetaminophen                  PRESENT      UNEXPECTED   Ibuprofen                      PRESENT      UNEXPECTED   Lidocaine                      PRESENT      UNEXPECTED   Dextrorphan/Levorphanol        PRESENT      UNEXPECTED    Dextrorphan is an expected metabolite of dextromethorphan, an    over-the-counter or prescription cough suppressant. Levorphanol    is a scheduled prescription medication. Dextrorphan cannot be    distinguished from levorphanol by the method used for analysis.   Guaifenesin                    PRESENT       UNEXPECTED    Guaifenesin may be administered as an over-the-counter or    prescription drug; it may also be present as a breakdown product    of methocarbamol. ==================================================================== Test  Result    Flag   Units      Ref Range   Creatinine              203              mg/dL      >=20 ==================================================================== Declared Medications:  The flagging and interpretation on this report are based on the  following declared medications.  Unexpected results may arise from  inaccuracies in the declared medications.  **Note: The testing scope of this panel includes these medications:  Amitriptyline  Gabapentin  Tramadol  Verapamil  **Note: The testing scope of this panel does not include following  reported medications:  Allopurinol  Calcium carbonate (Calcarb with Vitamin D)  Furosemide  Magnesium  Metformin  Multivitamin  Mupirocin  Spironolactone  Umeclidinium (Incruse Ellipta)  Vitamin B (Super B Complex)  Vitamin D (Calcarb with Vitamin D) ==================================================================== For clinical consultation, please call [phone]. ====================================================================    UDS interpretation: No unexpected findings.          Medication Assessment Form: Patient introduced to form today Treatment compliance: Treatment may start today if patient agrees with proposed plan. Evaluation of compliance is not applicable at this point Risk Assessment Profile: Aberrant behavior: See initial evaluations. None observed or detected today Comorbid factors increasing risk of overdose: See initial evaluation. No additional risks detected today Opioid risk tool (ORT) (Total Score): 1 Personal History of Substance Abuse (SUD-Substance use disorder):  Alcohol: Negative  Illegal Drugs: Negative  Rx Drugs: Negative  ORT Risk Level  calculation: Low Risk Risk of substance use disorder (SUD): Low Opioid Risk Tool - 08/12/18 1035      Family History of Substance Abuse   Alcohol  Negative    Illegal Drugs  Negative    Rx Drugs  Negative      Personal History of Substance Abuse   Alcohol  Negative    Illegal Drugs  Negative    Rx Drugs  Negative      Age   Age between 66-45 years   No      History of Preadolescent Sexual Abuse   History of Preadolescent Sexual Abuse  Negative or Female      Psychological Disease   Psychological Disease  Negative    Depression  Positive      Total Score   Opioid Risk Tool Scoring  1    Opioid Risk Interpretation  Low Risk      ORT Scoring interpretation table:  Score <3 = Low Risk for SUD  Score between 4-7 = Moderate Risk for SUD  Score >8 = High Risk for Opioid Abuse   Risk Mitigation Strategies:  Patient opioid safety counseling: Completed today. Counseling provided to patient as per "Patient Counseling Document". Document signed by patient, attesting to counseling and understanding Patient-Prescriber Agreement (PPA): Obtained today.  Controlled substance notification to other providers: Written and sent today.  Pharmacologic Plan: Today we may be taking over the patient's pharmacological regimen. See below.             Laboratory Chemistry  Inflammation Markers (CRP: Acute Phase) (ESR: Chronic Phase) Lab Results  Component Value Date   CRP 23 (H) 07/23/2018   ESRSEDRATE 43 (H) 07/23/2018                         Rheumatology Markers No results found.  Renal Function Markers Lab Results  Component Value Date   BUN  21 07/23/2018   CREATININE 1.05 (H) 07/23/2018   BCR 20 07/23/2018   GFRAA 64 07/23/2018   GFRNONAA 55 (L) 07/23/2018                             Hepatic Function Markers Lab Results  Component Value Date   AST 17 07/23/2018   ALBUMIN 4.7 07/23/2018   ALKPHOS 80 07/23/2018                        Electrolytes Lab Results  Component  Value Date   NA 135 07/23/2018   K 4.7 07/23/2018   CL 98 07/23/2018   CALCIUM 9.4 07/23/2018   MG 2.6 (H) 07/23/2018                        Neuropathy Markers Lab Results  Component Value Date   VITAMINB12 603 07/23/2018                        CNS Tests No results found.  Bone Pathology Markers Lab Results  Component Value Date   25OHVITD1 36 07/23/2018   25OHVITD2 <1.0 07/23/2018   25OHVITD3 36 07/23/2018                         Coagulation Parameters No results found.  Cardiovascular Markers No results found.  CA Markers No results found.  Note: Lab results reviewed.  Recent Diagnostic Imaging Review  Lumbosacral Imaging: Lumbar DG Bending views:  Results for orders placed during the hospital encounter of 07/23/18  DG Lumbar Spine Complete W/Bend   Narrative CLINICAL DATA:  67 year old female with chronic low back pain and sacroiliac joint pain since 2007. Shunt in place. Initial encounter.  EXAM: LUMBAR SPINE - COMPLETE WITH BENDING VIEWS  COMPARISON:  None.  FINDINGS: Curvature lumbar spine convex left. Bones appear osteopenic. No compression fracture or pars defect noted.  L1-2 mild disc space narrowing.  L2-3 mild right-sided disc space narrowing. Minimal retrolisthesis L2.  L3-4 bilateral facet degenerative changes.  L4-5 facet degenerative changes with 5 mm anterior slip L4 and mild L4-5 disc space narrowing.  L5-S1 facet degenerative changes and mild disc space narrowing.  Between flexion and extension, there is minimal excess motion at the L3-4 and L4-5 level.  Lumbar peritoneal shunt noted.  Vascular calcifications.  IMPRESSION: 1. Curvature lumbar spine convex left. 2. Bones appear osteopenic. 3. No compression fracture or pars defect noted. 4. L1-2 mild disc space narrowing. 5. L2-3 mild right-sided disc space narrowing. Minimal retrolisthesis L2. 6. L3-4 bilateral facet degenerative changes. 7. L4-5 facet degenerative  changes with 5 mm anterior slip L4 and mild L4-5 disc space narrowing. 8. L5-S1 facet degenerative changes and mild disc space narrowing. 9. Between flexion and extension, there is minimal excess motion at the L3-4 and L4-5 level. 10.  Aortic Atherosclerosis (ICD10-I70.0). 11. Lumbar peritoneal shunt in place.   Electronically Signed   By: Genia Del M.D.   On: 07/23/2018 15:16    Sacroiliac Joint Imaging: Sacroiliac Joint DG:  Results for orders placed during the hospital encounter of 07/23/18  DG Si Joints   Narrative CLINICAL DATA:  67 year old female with chronic low back pain and sacroiliac joint pain since 2007. Shunt in place. Initial encounter.  EXAM: BILATERAL SACROILIAC JOINTS - 3+ VIEW  COMPARISON:  Lumbar spine films same  date dictated separately.  FINDINGS: Sacroiliac joints are intact. Bones appear osteopenic. Mild bilateral hip joint degenerative changes. Lumbar peritoneal shunt is in place. Degenerative changes lumbar spine. Moderate stool.  IMPRESSION: 1. Sacroiliac joints appear intact. 2. Mild bilateral hip joint degenerative changes. 3. Bones appear osteopenic.   Electronically Signed   By: Genia Del M.D.   On: 07/23/2018 15:18    Complexity Note: Imaging results reviewed. Results shared with Ms. Hogrefe, using Layman's terms.                         Meds   Current Outpatient Medications:  .  allopurinol (ZYLOPRIM) 300 MG tablet, Take by mouth., Disp: , Rfl:  .  amitriptyline (ELAVIL) 10 MG tablet, Take by mouth., Disp: , Rfl:  .  b complex vitamins capsule, Take by mouth., Disp: , Rfl:  .  Calcium Carbonate-Vitamin D 600-400 MG-UNIT tablet, Take by mouth., Disp: , Rfl:  .  furosemide (LASIX) 40 MG tablet, Take by mouth., Disp: , Rfl:  .  gabapentin (NEURONTIN) 600 MG tablet, Take by mouth at bedtime. , Disp: , Rfl:  .  magnesium oxide (MAG-OX) 400 MG tablet, Take by mouth., Disp: , Rfl:  .  metFORMIN (GLUCOPHAGE-XR) 500 MG 24 hr  tablet, Take 500 mg by mouth 2 (two) times daily. , Disp: , Rfl:  .  Multiple Vitamin (MULTIVITAMIN) capsule, Take by mouth., Disp: , Rfl:  .  mupirocin ointment (BACTROBAN) 2 %, Apply two times a day for 7 days., Disp: 22 g, Rfl: 0 .  spironolactone (ALDACTONE) 100 MG tablet, Take 100 mg by mouth daily., Disp: , Rfl: 2 .  traMADol (ULTRAM) 50 MG tablet, Take 1 tablet (50 mg total) by mouth 4 (four) times daily for 7 days., Disp: 28 tablet, Rfl: 0 .  umeclidinium bromide (INCRUSE ELLIPTA) 62.5 MCG/INH AEPB, Inhale into the lungs., Disp: , Rfl:  .  verapamil (CALAN-SR) 240 MG CR tablet, Take by mouth., Disp: , Rfl:  .  [START ON 08/19/2018] traMADol (ULTRAM) 50 MG tablet, Take 1 tablet (50 mg total) by mouth every 6 (six) hours as needed for severe pain. Month last 30 days., Disp: 120 tablet, Rfl: 0  ROS  Constitutional: Denies any fever or chills Gastrointestinal: No reported hemesis, hematochezia, vomiting, or acute GI distress Musculoskeletal: Denies any acute onset joint swelling, redness, loss of ROM, or weakness Neurological: No reported episodes of acute onset apraxia, aphasia, dysarthria, agnosia, amnesia, paralysis, loss of coordination, or loss of consciousness  Allergies  Ms. Everman is allergic to penicillins; sulfa antibiotics; and bee venom.  [place]  Drug: Ms. Mellette  reports that she does not use drugs. Alcohol:  reports that she does not drink alcohol. Tobacco:  reports that she quit smoking about 27 years ago. Her smoking use included cigarettes. She has a 81.00 pack-year smoking history. She has never used smokeless tobacco. Medical:  has a past medical history of COPD (chronic obstructive pulmonary disease) ([place]), Depression (1974), Diabetes mellitus without complication ([place]), Hypertension, Migraine, and Ramsay Hunt auricular syndrome. Surgical: Ms. Eppard  has a past surgical history that includes Tympanostomy tube placement and Lumbar peritoneal shunt. Family: family history  is not on file.  Constitutional Exam  General appearance: Well nourished, well developed, and well hydrated. In no apparent acute distress Vitals:   08/12/18 1028  BP: 138/66  Pulse: 69  Resp: 20  Temp: 97.9 F (36.6 C)  TempSrc: Oral  SpO2: 95%  Weight:  210 lb (95.3 kg)  Height: _0  (1.651 m)   BMI Assessment: Estimated body mass index is 34.95 kg/m as calculated from the following:   Height as of this encounter: _1  (1.651 m).   Weight as of this encounter: 210 lb (95.3 kg).  BMI interpretation table: BMI level Category Range association with higher incidence of chronic pain  <18 kg/m2 Underweight   18.5-24.9 kg/m2 Ideal body weight   25-29.9 kg/m2 Overweight Increased incidence by 20%  30-34.9 kg/m2 Obese (Class I) Increased incidence by 68%  35-39.9 kg/m2 Severe obesity (Class II) Increased incidence by 136%  >40 kg/m2 Extreme obesity (Class III) Increased incidence by 254%   Patient's current BMI Ideal Body weight  Body mass index is 34.95 kg/m. Ideal body weight: 57 kg (125 lb 10.6 oz) Adjusted ideal body weight: 72.3 kg (159 lb 6.4 oz)   BMI Readings from Last 4 Encounters:  08/12/18 34.95 kg/m  07/23/18 35.45 kg/m  07/22/18 34.95 kg/m  07/15/18 35.83 kg/m   Wt Readings from Last 4 Encounters:  08/12/18 210 lb (95.3 kg)  07/23/18 213 lb (96.6 kg)  07/22/18 210 lb (95.3 kg)  07/15/18 216 lb (98 kg)  Psych/Mental status: Alert, oriented x 3 (person, place, & time)       Eyes: PERLA Respiratory: No evidence of acute respiratory distress  Cervical Spine Area Exam  Skin & Axial Inspection: No masses, redness, edema, swelling, or associated skin lesions Alignment: Symmetrical Functional ROM: Unrestricted ROM      Stability: No instability detected Muscle Tone/Strength: Functionally intact. No obvious neuro-muscular anomalies detected. Sensory (Neurological): Unimpaired Palpation: No palpable anomalies              Upper Extremity (UE) Exam    Side:  Right upper extremity  Side: Left upper extremity  Skin & Extremity Inspection: Skin color, temperature, and hair growth are WNL. No peripheral edema or cyanosis. No masses, redness, swelling, asymmetry, or associated skin lesions. No contractures.  Skin & Extremity Inspection: Skin color, temperature, and hair growth are WNL. No peripheral edema or cyanosis. No masses, redness, swelling, asymmetry, or associated skin lesions. No contractures.  Functional ROM: Unrestricted ROM          Functional ROM: Unrestricted ROM          Muscle Tone/Strength: Functionally intact. No obvious neuro-muscular anomalies detected.  Muscle Tone/Strength: Functionally intact. No obvious neuro-muscular anomalies detected.  Sensory (Neurological): Unimpaired          Sensory (Neurological): Unimpaired          Palpation: No palpable anomalies              Palpation: No palpable anomalies              Provocative Test(s):  Phalen's test: deferred Tinel's test: deferred Apley's scratch test (touch opposite shoulder):  Action 1 (Across chest): deferred Action 2 (Overhead): deferred Action 3 (LB reach): deferred   Provocative Test(s):  Phalen's test: deferred Tinel's test: deferred Apley's scratch test (touch opposite shoulder):  Action 1 (Across chest): deferred Action 2 (Overhead): deferred Action 3 (LB reach): deferred    Thoracic Spine Area Exam  Skin & Axial Inspection: No masses, redness, or swelling Alignment: Symmetrical Functional ROM: Unrestricted ROM Stability: No instability detected Muscle Tone/Strength: Functionally intact. No obvious neuro-muscular anomalies detected. Sensory (Neurological): Unimpaired Muscle strength & Tone: No palpable anomalies  Lumbar Spine Area Exam  Skin & Axial Inspection: No masses, redness, or swelling Alignment: Symmetrical Functional  ROM: Decreased ROM affecting both sides Stability: No instability detected Muscle Tone/Strength: Increased muscle tone over  affected area Sensory (Neurological): Movement-associated discomfort Palpation: Complains of area being tender to palpation       Provocative Tests: Hyperextension/rotation test: (+) bilaterally for facet joint pain. Lumbar quadrant test (Kemp's test): (+) bilaterally for facet joint pain. Lateral bending test: deferred today       Patrick's Maneuver:                   .  Attempting to do the [facility] maneuver on the left side triggered pain through the S1 distribution with cramping on the opposite hamstring. FABER test: deferred today                   S-I anterior distraction/compression test: deferred today         S-I lateral compression test: deferred today         S-I Thigh-thrust test: deferred today         S-I Gaenslen's test: deferred today          Gait & Posture Assessment  Ambulation: Limited Gait: Antalgic gait (limping) Posture: Difficulty standing up straight, due to pain   Lower Extremity Exam    Side: Right lower extremity  Side: Left lower extremity  Stability: No instability observed          Stability: No instability observed          Skin & Extremity Inspection: Skin color, temperature, and hair growth are WNL. No peripheral edema or cyanosis. No masses, redness, swelling, asymmetry, or associated skin lesions. No contractures.  Skin & Extremity Inspection: Skin color, temperature, and hair growth are WNL. No peripheral edema or cyanosis. No masses, redness, swelling, asymmetry, or associated skin lesions. No contractures.  Functional ROM: Unrestricted ROM                  Functional ROM: Unrestricted ROM                  Muscle Tone/Strength: Functionally intact. No obvious neuro-muscular anomalies detected.  Muscle Tone/Strength: S1 myotomal weakness (Foot Plantar Flexion) (Unable to toe walk)  Sensory (Neurological): Unimpaired        Sensory (Neurological): Unimpaired        DTR: Patellar: deferred today Achilles: deferred today Plantar: deferred today   DTR: Patellar: deferred today Achilles: deferred today Plantar: deferred today  Palpation: No palpable anomalies  Palpation: No palpable anomalies   Assessment & Plan  Primary Diagnosis & Pertinent Problem List: The primary encounter diagnosis was Chronic pain syndrome. 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  Visit Diagnosis: 1. Chronic pain syndrome   2. Chronic lower extremity pain (Primary Area of Pain) (Bilateral) (L>R)   3. Chronic low back pain (Secondary Area of Pain) (Bilateral) (L>R) w/ sciatica (Bilateral)   4. 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 Elevated C-reactive protein (CRP)   6. Elevated sed rate   7. Spondylosis of lumbar region without myelopathy or  radiculopathy   8. Lumbar facet joint syndrome   9. Back pain with left-sided radiculopathy   10. DDD (degenerative disc disease), lumbar   11. Other intervertebral disc degeneration, lumbar region   12. Elevated rheumatoid factor    Problems updated and reviewed during this visit: Problem  Elevated Rheumatoid Factor    Plan of Care  Pharmacotherapy (Medications Ordered): Meds ordered this encounter  Medications  . traMADol (ULTRAM) 50 MG tablet    Sig: Take 1 tablet (50 mg total) by mouth 4 (four) times daily for 7 days.    Dispense:  28 tablet    Refill:  0    [place] STOP ACT - Not applicable. Fill one day early if pharmacy is  closed on scheduled refill date. Do not fill until: 08/12/18. Must last 30 days. To last until: 08/19/18.  Marland Kitchen traMADol (ULTRAM) 50 MG tablet    Sig: Take 1 tablet (50 mg total) by mouth every 6 (six) hours as needed for severe pain. Month last 30 days.    Dispense:  120 tablet    Refill:  0    [place] STOP ACT - Not applicable. Fill one day early if pharmacy is closed on scheduled refill date. Do not fill until: 08/19/18 . Must last 30 days. To last until: 09/18/17.    Procedure Orders     Caudal Epidural Injection  Lab Orders     Rheumatoid factor     ANA w/Reflex if Positive  Imaging Orders     MR LUMBAR SPINE WO CONTRAST Referral Orders  No referral(s) requested today    Pharmacological management options:  Opioid Analgesics: We'll take over management today. See above orders Membrane stabilizer: We have discussed the possibility of optimizing this mode of therapy, if tolerated Muscle relaxant: We have discussed the possibility of a trial NSAID: We have discussed the possibility of a trial Other analgesic(s): To be determined at a later time   Interventional management options: Planned, scheduled, and/or pending:    Diagnostic Caudal ESI #1    Considering:   Diagnostic midline lumbar epidural steroid injection  Diagnostic  bilateral lumbar facet nerve block  Possible bilateral lumbar facet radiofrequency ablation    PRN Procedures:   None at this time   Provider-requested follow-up: Return for Procedure (w/ sedation): (L) Caudal ESI #1.  Future Appointments  Date Time Provider [place]  08/20/2018 10:15 AM Cleophus Molt E, PTA [facility] None  08/22/2018 11:15 AM Janna Arch, PT [facility] None  08/27/2018 10:30 AM Larae Grooms, PTA [facility] None  08/29/2018 10:15 AM Janna Arch, PT [facility] None  08/29/2018  3:00 PM [facility]  09/19/2018  9:45 AM Milinda Pointer, MD [facility] None    Primary Care Physician: Konrad Saha,  MD Location: [facility] Note by: Gaspar Cola, MD Date: 08/12/2018; Time: 11:39 AM

## 2018-08-12 ENCOUNTER — Other Ambulatory Visit: Payer: Self-pay

## 2018-08-12 ENCOUNTER — Ambulatory Visit: Payer: Medicare Other | Attending: Pain Medicine | Admitting: Pain Medicine

## 2018-08-12 ENCOUNTER — Encounter: Payer: Self-pay | Admitting: Pain Medicine

## 2018-08-12 VITALS — BP 138/66 | HR 69 | Temp 97.9°F | Resp 20 | Ht 65.0 in | Wt 210.0 lb

## 2018-08-12 DIAGNOSIS — R7982 Elevated C-reactive protein (CRP): Secondary | ICD-10-CM | POA: Diagnosis not present

## 2018-08-12 DIAGNOSIS — Z87891 Personal history of nicotine dependence: Secondary | ICD-10-CM | POA: Insufficient documentation

## 2018-08-12 DIAGNOSIS — E119 Type 2 diabetes mellitus without complications: Secondary | ICD-10-CM | POA: Diagnosis not present

## 2018-08-12 DIAGNOSIS — M533 Sacrococcygeal disorders, not elsewhere classified: Secondary | ICD-10-CM | POA: Insufficient documentation

## 2018-08-12 DIAGNOSIS — G894 Chronic pain syndrome: Secondary | ICD-10-CM | POA: Diagnosis present

## 2018-08-12 DIAGNOSIS — R700 Elevated erythrocyte sedimentation rate: Secondary | ICD-10-CM | POA: Insufficient documentation

## 2018-08-12 DIAGNOSIS — M79604 Pain in right leg: Secondary | ICD-10-CM | POA: Diagnosis not present

## 2018-08-12 DIAGNOSIS — M48061 Spinal stenosis, lumbar region without neurogenic claudication: Secondary | ICD-10-CM | POA: Insufficient documentation

## 2018-08-12 DIAGNOSIS — I10 Essential (primary) hypertension: Secondary | ICD-10-CM | POA: Diagnosis not present

## 2018-08-12 DIAGNOSIS — Z79899 Other long term (current) drug therapy: Secondary | ICD-10-CM | POA: Insufficient documentation

## 2018-08-12 DIAGNOSIS — M47816 Spondylosis without myelopathy or radiculopathy, lumbar region: Secondary | ICD-10-CM | POA: Diagnosis not present

## 2018-08-12 DIAGNOSIS — Z880 Allergy status to penicillin: Secondary | ICD-10-CM | POA: Insufficient documentation

## 2018-08-12 DIAGNOSIS — J449 Chronic obstructive pulmonary disease, unspecified: Secondary | ICD-10-CM | POA: Diagnosis not present

## 2018-08-12 DIAGNOSIS — M79605 Pain in left leg: Secondary | ICD-10-CM | POA: Insufficient documentation

## 2018-08-12 DIAGNOSIS — Z882 Allergy status to sulfonamides status: Secondary | ICD-10-CM | POA: Insufficient documentation

## 2018-08-12 DIAGNOSIS — F329 Major depressive disorder, single episode, unspecified: Secondary | ICD-10-CM | POA: Insufficient documentation

## 2018-08-12 DIAGNOSIS — M5136 Other intervertebral disc degeneration, lumbar region: Secondary | ICD-10-CM | POA: Insufficient documentation

## 2018-08-12 DIAGNOSIS — M8580 Other specified disorders of bone density and structure, unspecified site: Secondary | ICD-10-CM | POA: Diagnosis not present

## 2018-08-12 DIAGNOSIS — Z7984 Long term (current) use of oral hypoglycemic drugs: Secondary | ICD-10-CM | POA: Diagnosis not present

## 2018-08-12 DIAGNOSIS — M5441 Lumbago with sciatica, right side: Secondary | ICD-10-CM

## 2018-08-12 DIAGNOSIS — M5442 Lumbago with sciatica, left side: Secondary | ICD-10-CM | POA: Diagnosis not present

## 2018-08-12 DIAGNOSIS — G8929 Other chronic pain: Secondary | ICD-10-CM

## 2018-08-12 DIAGNOSIS — M549 Dorsalgia, unspecified: Secondary | ICD-10-CM | POA: Diagnosis not present

## 2018-08-12 DIAGNOSIS — M5410 Radiculopathy, site unspecified: Secondary | ICD-10-CM | POA: Insufficient documentation

## 2018-08-12 DIAGNOSIS — R768 Other specified abnormal immunological findings in serum: Secondary | ICD-10-CM

## 2018-08-12 MED ORDER — TRAMADOL HCL 50 MG PO TABS: 50.0000 mg | ORAL_TABLET | Freq: Four times a day (QID) | ORAL | 0 refills | Status: DC

## 2018-08-12 MED ORDER — TRAMADOL HCL 50 MG PO TABS: 50.0000 mg | ORAL_TABLET | Freq: Four times a day (QID) | ORAL | 0 refills | Status: DC | PRN

## 2018-08-12 NOTE — Patient Instructions (Addendum)
____________________________________________________________________________________________  Preparing for Procedure with Sedation  Instructions: . Oral Intake: Do not eat or drink anything for at least 8 hours prior to your procedure. . Transportation: Public transportation is not allowed. Bring an adult driver. The driver must be physically present in our waiting room before any procedure can be started. Marland Kitchen Physical Assistance: Bring an adult physically capable of assisting you, in the event you need help. This adult should keep you company at home for at least 6 hours after the procedure. . Blood Pressure Medicine: Take your blood pressure medicine with a sip of water the morning of the procedure. . Blood thinners: Notify our staff if you are taking any blood thinners. Depending on which one you take, there will be specific instructions on how and when to stop it. . Diabetics on insulin: Notify the staff so that you can be scheduled 1st case in the morning. If your diabetes requires high dose insulin, take only  of your normal insulin dose the morning of the procedure and notify the staff that you have done so. . Preventing infections: Shower with an antibacterial soap the morning of your procedure. . Build-up your immune system: Take 1000 mg of Vitamin C with every meal (3 times a day) the day prior to your procedure. Marland Kitchen Antibiotics: Inform the staff if you have a condition or reason that requires you to take antibiotics before dental procedures. . Pregnancy: If you are pregnant, call and cancel the procedure. . Sickness: If you have a cold, fever, or any active infections, call and cancel the procedure. . Arrival: You must be in the facility at least 30 minutes prior to your scheduled procedure. . Children: Do not bring children with you. . Dress appropriately: Bring dark clothing that you would not mind if they get stained. . Valuables: Do not bring any jewelry or valuables.  Procedure  appointments are reserved for interventional treatments only. Marland Kitchen No Prescription Refills. . No medication changes will be discussed during procedure appointments. . No disability issues will be discussed.  Reasons to call and reschedule or cancel your procedure: (Following these recommendations will minimize the risk of a serious complication.) . Surgeries: Avoid having procedures within 2 weeks of any surgery. (Avoid for 2 weeks before or after any surgery). . Flu Shots: Avoid having procedures within 2 weeks of a flu shots or . (Avoid for 2 weeks before or after immunizations). . Barium: Avoid having a procedure within 7-10 days after having had a radiological study involving the use of radiological contrast. (Myelograms, Barium swallow or enema study). . Heart attacks: Avoid any elective procedures or surgeries for the initial 6 months after a "Myocardial Infarction" (Heart Attack). . Blood thinners: It is imperative that you stop these medications before procedures. Let us know if you if you take any blood thinner.  . Infection: Avoid procedures during or within two weeks of an infection (including chest colds or gastrointestinal problems). Symptoms associated with infections include: Localized redness, fever, chills, night sweats or profuse sweating, burning sensation when voiding, cough, congestion, stuffiness, runny nose, sore throat, diarrhea, nausea, vomiting, cold or Flu symptoms, recent or current infections. It is specially important if the infection is over the area that we intend to treat. Marland Kitchen Heart and lung problems: Symptoms that may suggest an active cardiopulmonary problem include: cough, chest pain, breathing difficulties or shortness of breath, dizziness, ankle swelling, uncontrolled high or unusually low blood pressure, and/or palpitations. If you are experiencing any of these symptoms, cancel  your procedure and contact your primary care physician for an evaluation.  Remember:   Regular Business hours are:  Monday to Thursday 8:00 AM to 4:00 PM  Provider's Schedule: Milinda Pointer, MD:  Procedure days: Tuesday and Thursday 7:30 AM to 4:00 PM  Gillis Santa, MD:  Procedure days: Monday and Wednesday 7:30 AM to 4:00 PM ____________________________________________________________________________________________   ____________________________________________________________________________________________  Medication Rules  Purpose: To inform patients, and their family members, of our rules and regulations.  Applies to: All patients receiving prescriptions (written or electronic).  Pharmacy of record: Pharmacy where electronic prescriptions will be sent. If written prescriptions are taken to a different pharmacy, please inform the nursing staff. The pharmacy listed in the electronic medical record should be the one where you would like electronic prescriptions to be sent.  Electronic prescriptions: In compliance with the [place] (STOP) Act of 2017 (Session Lanny Cramp [phone]), effective September 11, 2018, all controlled substances must be electronically prescribed. Calling prescriptions to the pharmacy will cease to exist.  Prescription refills: Only during scheduled appointments. Applies to all prescriptions.  NOTE: The following applies primarily to controlled substances (Opioid* Pain Medications).   Patient's responsibilities: 1. Pain Pills: Bring all pain pills to every appointment (except for procedure appointments). 2. Pill Bottles: Bring pills in original pharmacy bottle. Always bring the newest bottle. Bring bottle, even if empty. 3. Medication refills: You are responsible for knowing and keeping track of what medications you take and those you need refilled. The day before your appointment: write a list of all prescriptions that need to be refilled. The day of the appointment: give the list to the admitting  nurse. Prescriptions will be written only during appointments. If you forget a medication: it will not be "Called in", "Faxed", or "electronically sent". You will need to get another appointment to get these prescribed. No early refills. Do not call asking to have your prescription filled early. 4. Prescription Accuracy: You are responsible for carefully inspecting your prescriptions before leaving our office. Have the discharge nurse carefully go over each prescription with you, before taking them home. Make sure that your name is accurately spelled, that your address is correct. Check the name and dose of your medication to make sure it is accurate. Check the number of pills, and the written instructions to make sure they are clear and accurate. Make sure that you are given enough medication to last until your next medication refill appointment. 5. Taking Medication: Take medication as prescribed. When it comes to controlled substances, taking less pills or less frequently than prescribed is permitted and encouraged. Never take more pills than instructed. Never take medication more frequently than prescribed.  6. Inform other Doctors: Always inform, all of your healthcare providers, of all the medications you take. 7. Pain Medication from other Providers: You are not allowed to accept any additional pain medication from any other Doctor or Healthcare provider. There are two exceptions to this rule. (see below) In the event that you require additional pain medication, you are responsible for notifying us, as stated below. 8. Medication Agreement: You are responsible for carefully reading and following our Medication Agreement. This must be signed before receiving any prescriptions from our practice. Safely store a copy of your signed Agreement. Violations to the Agreement will result in no further prescriptions. (Additional copies of our Medication Agreement are available upon request.) 9. Laws, Rules, &  Regulations: All patients are expected to follow all Federal and [employer],  Rules, & Regulations. Ignorance of the Laws does not constitute a valid excuse. The use of any illegal substances is prohibited. 10. Adopted CDC guidelines & recommendations: Target dosing levels will be at or below 60 MME/day. Use of benzodiazepines** is not recommended.  Exceptions: There are only two exceptions to the rule of not receiving pain medications from other Healthcare Providers. 1. Exception #1 (Emergencies): In the event of an emergency (i.e.: accident requiring emergency care), you are allowed to receive additional pain medication. However, you are responsible for: As soon as you are able, call our office [phone], at any time of the day or night, and leave a message stating your name, the date and nature of the emergency, and the name and dose of the medication prescribed. In the event that your call is answered by a member of our staff, make sure to document and save the date, time, and the name of the person that took your information.  2. Exception #2 (Planned Surgery): In the event that you are scheduled by another doctor or dentist to have any type of surgery or procedure, you are allowed (for a period no longer than 30 days), to receive additional pain medication, for the acute post-op pain. However, in this case, you are responsible for picking up a copy of our "Post-op Pain Management for Surgeons" handout, and giving it to your surgeon or dentist. This document is available at our office, and does not require an appointment to obtain it. Simply go to our office during business hours (Monday-Thursday from 8:00 AM to 4:00 PM) (Friday 8:00 AM to 12:00 Noon) or if you have a scheduled appointment with [place], prior to your surgery, and ask for it by name. In addition, you will need to provide [place] with your name, name of your surgeon, type of surgery, and date of procedure or surgery.  *Opioid  medications include: morphine, codeine, oxycodone, oxymorphone, hydrocodone, hydromorphone, meperidine, tramadol, tapentadol, buprenorphine, fentanyl, methadone. **Benzodiazepine medications include: diazepam (Valium), alprazolam (Xanax), clonazepam (Klonopine), lorazepam (Ativan), clorazepate (Tranxene), chlordiazepoxide (Librium), estazolam (Prosom), oxazepam (Serax), temazepam (Restoril), triazolam (Halcion) (Last updated: 11/08/2017) ____________________________________________________________________________________________   ____________________________________________________________________________________________  Medication Recommendations and Reminders  Applies to: All patients receiving prescriptions (written and/or electronic).  Medication Rules & Regulations: These rules and regulations exist for your safety and that of others. They are not flexible and neither are we. Dismissing or ignoring them will be considered "non-compliance" with medication therapy, resulting in complete and irreversible termination of such therapy. (See document titled "Medication Rules" for more details.) In all conscience, because of safety reasons, we cannot continue providing a therapy where the patient does not follow instructions.  Pharmacy of record:   Definition: This is the pharmacy where your electronic prescriptions will be sent.   We do not endorse any particular pharmacy.  You are not restricted in your choice of pharmacy.  The pharmacy listed in the electronic medical record should be the one where you want electronic prescriptions to be sent.  If you choose to change pharmacy, simply notify our nursing staff of your choice of new pharmacy.  Recommendations:  Keep all of your pain medications in a safe place, under lock and key, even if you live alone.   After you fill your prescription, take 1 week's worth of pills and put them away in a safe place. You should keep a separate,  properly labeled bottle for this purpose. The remainder should be kept in the original bottle. Use this as your primary  supply, until it runs out. Once it's gone, then you know that you have 1 week's worth of medicine, and it is time to come in for a prescription refill. If you do this correctly, it is unlikely that you will ever run out of medicine.  To make sure that the above recommendation works, it is very important that you make sure your medication refill appointments are scheduled at least 1 week before you run out of medicine. To do this in an effective manner, make sure that you do not leave the office without scheduling your next medication management appointment. Always ask the nursing staff to show you in your prescription , when your medication will be running out. Then arrange for the receptionist to get you a return appointment, at least 7 days before you run out of medicine. Do not wait until you have 1 or 2 pills left, to come in. This is very poor planning and does not take into consideration that we may need to cancel appointments due to bad weather, sickness, or emergencies affecting our staff.  "Partial Fill": If for any reason your pharmacy does not have enough pills/tablets to completely fill or refill your prescription, do not allow for a "partial fill". You will need a separate prescription to fill the remaining amount, which we will not provide. If the reason for the partial fill is your insurance, you will need to talk to the pharmacist about payment alternatives for the remaining tablets, but again, do not accept a partial fill.  Prescription refills and/or changes in medication(s):   Prescription refills, and/or changes in dose or medication, will be conducted only during scheduled medication management appointments. (Applies to both, written and electronic prescriptions.)  No refills on procedure days. No medication will be changed or started on procedure days. No changes,  adjustments, and/or refills will be conducted on a procedure day. Doing so will interfere with the diagnostic portion of the procedure.  No phone refills. No medications will be "called into the pharmacy".  No Fax refills.  No weekend refills.  No Holliday refills.  No after hours refills.  Remember:  Business hours are:  Monday to Thursday 8:00 AM to 4:00 PM Provider's Schedule: Dionisio David, NP - Appointments are:  Medication management: Monday to Thursday 8:00 AM to 4:00 PM Milinda Pointer, MD - Appointments are:  Medication management: Monday and Wednesday 8:00 AM to 4:00 PM Procedure day: Tuesday and Thursday 7:30 AM to 4:00 PM Gillis Santa, MD - Appointments are:  Medication management: Tuesday and Thursday 8:00 AM to 4:00 PM Procedure day: Monday and Wednesday 7:30 AM to 4:00 PM (Last update: 11/08/2017) ____________________________________________________________________________________________   ____________________________________________________________________________________________  CANNABIDIOL (AKA: CBD Oil or Pills)  Applies to: All patients receiving prescriptions of controlled substances (written and/or electronic).  General Information: Cannabidiol (CBD) was discovered in 60. It is one of some 113 identified cannabinoids in cannabis (Marijuana) plants, accounting for up to 40% of the plant's extract. As of 2018, preliminary clinical research on cannabidiol included studies of anxiety, cognition, movement disorders, and pain.  Cannabidiol is consummed in multiple ways, including inhalation of cannabis smoke or vapor, as an aerosol spray into the cheek, and by mouth. It may be supplied as CBD oil containing CBD as the active ingredient (no added tetrahydrocannabinol (THC) or terpenes), a full-plant CBD-dominant hemp extract oil, capsules, dried cannabis, or as a liquid solution. CBD is thought not have the same psychoactivity as THC, and may affect the actions  of THC.  Studies suggest that CBD may interact with different biological targets, including cannabinoid receptors and other neurotransmitter receptors. As of 2018 the mechanism of action for its biological effects has not been determined.  In the [place], cannabidiol has a limited approval by the Food and Drug Administration (FDA) for treatment of only two types of epilepsy disorders. The side effects of long-term use of the drug include somnolence, decreased appetite, diarrhea, fatigue, malaise, weakness, sleeping problems, and others.  CBD remains a Schedule I drug prohibited for any use.  Legality: Some manufacturers ship CBD products nationally, an illegal action which the FDA has not enforced in 2018, with CBD remaining the subject of an FDA investigational new drug evaluation, and is not considered legal as a dietary supplement or food ingredient as of December 2018. Federal illegality has made it difficult historically to conduct research on CBD. CBD is openly sold in head shops and health food stores in some states where such sales have not been explicitly legalized.  Warning: Because it is not FDA approved for general use or treatment of pain, it is not required to undergo the same manufacturing controls as prescription drugs.  This means that the available cannabidiol (CBD) may be contaminated with THC.  If this is the case, it will trigger a positive urine drug screen (UDS) test for cannabinoids (Marijuana).  Because a positive UDS for illicit substances is a violation of our medication agreement, your opioid analgesics (pain medicine) may be permanently discontinued. (Last update: 11/29/2017) ____________________________________________________________________________________________   Pain Management Discharge Instructions  General Discharge Instructions :  If you need to reach your doctor call: Monday-Friday 8:00 am - 4:00 pm at [phone] or toll free [phone].  After  clinic hours [phone] to have operator reach doctor.  Bring all of your medication bottles to all your appointments in the pain clinic.  To cancel or reschedule your appointment with Pain Management please remember to call 24 hours in advance to avoid a fee.  Refer to the educational materials which you have been given on: General Risks, I had my Procedure. Discharge Instructions, Post Sedation.  Post Procedure Instructions:  The drugs you were given will stay in your system until tomorrow, so for the next 24 hours you should not drive, make any legal decisions or drink any alcoholic beverages.  You may eat anything you prefer, but it is better to start with liquids then soups and crackers, and gradually work up to solid foods.  Please notify your doctor immediately if you have any unusual bleeding, trouble breathing or pain that is not related to your normal pain.  Depending on the type of procedure that was done, some parts of your body may feel week and/or numb.  This usually clears up by tonight or the next day.  Walk with the use of an assistive device or accompanied by an adult for the 24 hours.  You may use ice on the affected area for the first 24 hours.  Put ice in a Ziploc bag and cover with a towel and place against area 15 minutes on 15 minutes off.  You may switch to heat after 24 hours.GENERAL RISKS AND COMPLICATIONS  What are the risk, side effects and possible complications? Generally speaking, most procedures are safe.  However, with any procedure there are risks, side effects, and the possibility of complications.  The risks and complications are dependent upon the sites that are lesioned, or the type of nerve block to be performed.  The closer  the procedure is to the spine, the more serious the risks are.  Great care is taken when placing the radio frequency needles, block needles or lesioning probes, but sometimes complications can occur. 1. Infection: Any time there  is an injection through the skin, there is a risk of infection.  This is why sterile conditions are used for these blocks.  There are four possible types of infection. 1. Localized skin infection. 2. Central Nervous System Infection-This can be in the form of Meningitis, which can be deadly. 3. Epidural Infections-This can be in the form of an epidural abscess, which can cause pressure inside of the spine, causing compression of the spinal cord with subsequent paralysis. This would require an emergency surgery to decompress, and there are no guarantees that the patient would recover from the paralysis. 4. Discitis-This is an infection of the intervertebral discs.  It occurs in about 1% of discography procedures.  It is difficult to treat and it may lead to surgery.        2. Pain: the needles have to go through skin and soft tissues, will cause soreness.       3. Damage to internal structures:  The nerves to be lesioned may be near blood vessels or    other nerves which can be potentially damaged.       4. Bleeding: Bleeding is more common if the patient is taking blood thinners such as  aspirin, Coumadin, Ticiid, Plavix, etc., or if he/she have some genetic predisposition  such as hemophilia. Bleeding into the spinal canal can cause compression of the spinal  cord with subsequent paralysis.  This would require an emergency surgery to  decompress and there are no guarantees that the patient would recover from the  paralysis.       5. Pneumothorax:  Puncturing of a lung is a possibility, every time a needle is introduced in  the area of the chest or upper back.  Pneumothorax refers to free air around the  collapsed lung(s), inside of the thoracic cavity (chest cavity).  Another two possible  complications related to a similar event would include: Hemothorax and Chylothorax.   These are variations of the Pneumothorax, where instead of air around the collapsed  lung(s), you may have blood or chyle,  respectively.       6. Spinal headaches: They may occur with any procedures in the area of the spine.       7. Persistent CSF (Cerebro-Spinal Fluid) leakage: This is a rare problem, but may occur  with prolonged intrathecal or epidural catheters either due to the formation of a fistulous  track or a dural tear.       8. Nerve damage: By working so close to the spinal cord, there is always a possibility of  nerve damage, which could be as serious as a permanent spinal cord injury with  paralysis.       9. Death:  Although rare, severe deadly allergic reactions known as "Anaphylactic  reaction" can occur to any of the medications used.      10. Worsening of the symptoms:  We can always make thing worse.  What are the chances of something like this happening? Chances of any of this occuring are extremely low.  By statistics, you have more of a chance of getting killed in a motor vehicle accident: while driving to the hospital than any of the above occurring .  Nevertheless, you should be aware that they are possibilities.  In general, it is similar to taking a shower.  Everybody knows that you can slip, hit your head and get killed.  Does that mean that you should not shower again?  Nevertheless always keep in mind that statistics do not mean anything if you happen to be on the wrong side of them.  Even if a procedure has a 1 (one) in a 1,000,000 (million) chance of going wrong, it you happen to be that one..Also, keep in mind that by statistics, you have more of a chance of having something go wrong when taking medications.  Who should not have this procedure? If you are on a blood thinning medication (e.g. Coumadin, Plavix, see list of "Blood Thinners"), or if you have an active infection going on, you should not have the procedure.  If you are taking any blood thinners, please inform your physician.  How should I prepare for this procedure?  Do not eat or drink anything at least six hours prior to the  procedure.  Bring a driver with you .  It cannot be a taxi.  Come accompanied by an adult that can drive you back, and that is strong enough to help you if your legs get weak or numb from the local anesthetic.  Take all of your medicines the morning of the procedure with just enough water to swallow them.  If you have diabetes, make sure that you are scheduled to have your procedure done first thing in the morning, whenever possible.  If you have diabetes, take only half of your insulin dose and notify our nurse that you have done so as soon as you arrive at the clinic.  If you are diabetic, but only take blood sugar pills (oral hypoglycemic), then do not take them on the morning of your procedure.  You may take them after you have had the procedure.  Do not take aspirin or any aspirin-containing medications, at least eleven (11) days prior to the procedure.  They may prolong bleeding.  Wear loose fitting clothing that may be easy to take off and that you would not mind if it got stained with Betadine or blood.  Do not wear any jewelry or perfume  Remove any nail coloring.  It will interfere with some of our monitoring equipment.  NOTE: Remember that this is not meant to be interpreted as a complete list of all possible complications.  Unforeseen problems may occur.  BLOOD THINNERS The following drugs contain aspirin or other products, which can cause increased bleeding during surgery and should not be taken for 2 weeks prior to and 1 week after surgery.  If you should need take something for relief of minor pain, you may take acetaminophen which is found in Tylenol,m Datril, Anacin-3 and Panadol. It is not blood thinner. The products listed below are.  Do not take any of the products listed below in addition to any listed on your instruction sheet.  A.P.C or A.P.C with Codeine Codeine Phosphate Capsules #3 Ibuprofen Ridaura  ABC compound Congesprin Imuran rimadil  Advil Cope Indocin  Robaxisal  Alka-Seltzer Effervescent Pain Reliever and Antacid Coricidin or Coricidin-D  Indomethacin Rufen  Alka-Seltzer plus Cold Medicine Cosprin Ketoprofen S-A-C Tablets  Anacin Analgesic Tablets or Capsules Coumadin Korlgesic Salflex  Anacin Extra Strength Analgesic tablets or capsules CP-2 Tablets Lanoril Salicylate  Anaprox Cuprimine Capsules Levenox Salocol  Anexsia-D Dalteparin Magan Salsalate  Anodynos Darvon compound Magnesium Salicylate Sine-off  Ansaid Dasin Capsules Magsal Sodium Salicylate  Anturane Depen Capsules  Marnal Soma  APF Arthritis pain formula Dewitt's Pills Measurin Stanback  Argesic Dia-Gesic Meclofenamic Sulfinpyrazone  Arthritis Bayer Timed Release Aspirin Diclofenac Meclomen Sulindac  Arthritis pain formula Anacin Dicumarol Medipren Supac  Analgesic (Safety coated) Arthralgen Diffunasal Mefanamic Suprofen  Arthritis Strength Bufferin Dihydrocodeine Mepro Compound Suprol  Arthropan liquid Dopirydamole Methcarbomol with Aspirin Synalgos  ASA tablets/Enseals Disalcid Micrainin Tagament  Ascriptin Doan's Midol Talwin  Ascriptin A/D Dolene Mobidin Tanderil  Ascriptin Extra Strength Dolobid Moblgesic Ticlid  Ascriptin with Codeine Doloprin or Doloprin with Codeine Momentum Tolectin  Asperbuf Duoprin Mono-gesic Trendar  Aspergum Duradyne Motrin or Motrin IB Triminicin  Aspirin plain, buffered or enteric coated Durasal Myochrisine Trigesic  Aspirin Suppositories Easprin Nalfon Trillsate  Aspirin with Codeine Ecotrin Regular or Extra Strength Naprosyn Uracel  Atromid-S Efficin Naproxen Ursinus  Auranofin Capsules Elmiron Neocylate Vanquish  Axotal Emagrin Norgesic Verin  Azathioprine Empirin or Empirin with Codeine Normiflo Vitamin E  Azolid Emprazil Nuprin Voltaren  Bayer Aspirin plain, buffered or children's or timed BC Tablets or powders Encaprin Orgaran Warfarin Sodium  Buff-a-Comp Enoxaparin Orudis Zorpin  Buff-a-Comp with Codeine Equegesic Os-Cal-Gesic    Buffaprin Excedrin plain, buffered or Extra Strength Oxalid   Bufferin Arthritis Strength Feldene Oxphenbutazone   Bufferin plain or Extra Strength Feldene Capsules Oxycodone with Aspirin   Bufferin with Codeine Fenoprofen Fenoprofen Pabalate or Pabalate-SF   Buffets II Flogesic Panagesic   Buffinol plain or Extra Strength Florinal or Florinal with Codeine Panwarfarin   Buf-Tabs Flurbiprofen Penicillamine   Butalbital Compound Four-way cold tablets Penicillin   Butazolidin Fragmin Pepto-Bismol   Carbenicillin Geminisyn Percodan   Carna Arthritis Reliever Geopen Persantine   Carprofen Gold's salt Persistin   Chloramphenicol Goody's Phenylbutazone   Chloromycetin Haltrain Piroxlcam   Clmetidine heparin Plaquenil   Cllnoril Hyco-pap Ponstel   Clofibrate Hydroxy chloroquine Propoxyphen         Before stopping any of these medications, be sure to consult the physician who ordered them.  Some, such as Coumadin (Warfarin) are ordered to prevent or treat serious conditions such as "deep thrombosis", "pumonary embolisms", and other heart problems.  The amount of time that you may need off of the medication may also vary with the medication and the reason for which you were taking it.  If you are taking any of these medications, please make sure you notify your pain physician before you undergo any procedures.         Epidural Steroid Injection Patient Information  Description: The epidural space surrounds the nerves as they exit the spinal cord.  In some patients, the nerves can be compressed and inflamed by a bulging disc or a tight spinal canal (spinal stenosis).  By injecting steroids into the epidural space, we can bring irritated nerves into direct contact with a potentially helpful medication.  These steroids act directly on the irritated nerves and can reduce swelling and inflammation which often leads to decreased pain.  Epidural steroids may be injected anywhere along the spine and  from the neck to the low back depending upon the location of your pain.   After numbing the skin with local anesthetic (like Novocaine), a small needle is passed into the epidural space slowly.  You may experience a sensation of pressure while this is being done.  The entire block usually last less than 10 minutes.  Conditions which may be treated by epidural steroids:   Low back and leg pain  Neck and arm pain  Spinal stenosis  Post-laminectomy syndrome  Herpes zoster (  shingles) pain  Pain from compression fractures  Preparation for the injection:  1. Do not eat any solid food or dairy products within 8 hours of your appointment.  2. You may drink clear liquids up to 3 hours before appointment.  Clear liquids include water, black coffee, juice or soda.  No milk or cream please. 3. You may take your regular medication, including pain medications, with a sip of water before your appointment  Diabetics should hold regular insulin (if taken separately) and take 1/2 normal NPH dos the morning of the procedure.  Carry some sugar containing items with you to your appointment. 4. A driver must accompany you and be prepared to drive you home after your procedure.  5. Bring all your current medications with your. 6. An IV may be inserted and sedation may be given at the discretion of the physician.   7. A blood pressure cuff, EKG and other monitors will often be applied during the procedure.  Some patients may need to have extra oxygen administered for a short period. 8. You will be asked to provide medical information, including your allergies, prior to the procedure.  We must know immediately if you are taking blood thinners (like Coumadin/Warfarin)  Or if you are allergic to IV iodine contrast (dye). We must know if you could possible be pregnant.  Possible side-effects:  Bleeding from needle site  Infection (rare, may require surgery)  Nerve injury (rare)  Numbness & tingling  (temporary)  Difficulty urinating (rare, temporary)  Spinal headache ( a headache worse with upright posture)  Light -headedness (temporary)  Pain at injection site (several days)  Decreased blood pressure (temporary)  Weakness in arm/leg (temporary)  Pressure sensation in back/neck (temporary)  Call if you experience:  Fever/chills associated with headache or increased back/neck pain.  Headache worsened by an upright position.  New onset weakness or numbness of an extremity below the injection site  Hives or difficulty breathing (go to the emergency room)  Inflammation or drainage at the infection site  Severe back/neck pain  Any new symptoms which are concerning to you  Please note:  Although the local anesthetic injected can often make your back or neck feel good for several hours after the injection, the pain will likely return.  It takes 3-7 days for steroids to work in the epidural space.  You may not notice any pain relief for at least that one week.  If effective, we will often do a series of three injections spaced 3-6 weeks apart to maximally decrease your pain.  After the initial series, we generally will wait several months before considering a repeat injection of the same type.  If you have any questions, please call [phone] [place] E-SCRIBED TO YOUR PHARMACY OF CHOICE.

## 2018-08-12 NOTE — Progress Notes (Deleted)
Safety precautions to be maintained throughout the outpatient stay will include: orient to surroundings, keep bed in low position, maintain call bell within reach at all times, provide assistance with transfer out of bed and ambulation.  

## 2018-08-12 NOTE — Progress Notes (Signed)
Nursing Pain Medication Assessment:  Safety precautions to be maintained throughout the outpatient stay will include: orient to surroundings, keep bed in low position, maintain call bell within reach at all times, provide assistance with transfer out of bed and ambulation.  Medication Inspection Compliance: Pill count conducted under aseptic conditions, in front of the patient. Neither the pills nor the bottle was removed from the patient's sight at any time. Once count was completed pills were immediately returned to the patient in their original bottle.  Medication: Tramadol (Ultram) Pill/Patch Count: 38 of 60 pills remain Pill/Patch Appearance: Markings consistent with prescribed medication Bottle Appearance: Standard pharmacy container. Clearly labeled. Filled Date: 10 / 19 / 2019 Last Medication intake:  Today

## 2018-08-13 ENCOUNTER — Ambulatory Visit: Payer: Medicare Other

## 2018-08-13 DIAGNOSIS — R768 Other specified abnormal immunological findings in serum: Secondary | ICD-10-CM | POA: Insufficient documentation

## 2018-08-13 LAB — RHEUMATOID FACTOR: Rhuematoid fact SerPl-aCnc: 23 IU/mL — ABNORMAL HIGH (ref 0.0–13.9)

## 2018-08-13 LAB — ANA W/REFLEX IF POSITIVE: ANA: NEGATIVE

## 2018-08-15 ENCOUNTER — Ambulatory Visit: Payer: Medicare Other

## 2018-08-20 ENCOUNTER — Ambulatory Visit: Payer: Medicare Other

## 2018-08-22 ENCOUNTER — Ambulatory Visit: Payer: Medicare Other | Attending: Rehabilitation

## 2018-08-22 DIAGNOSIS — R2689 Other abnormalities of gait and mobility: Secondary | ICD-10-CM | POA: Diagnosis present

## 2018-08-22 DIAGNOSIS — M545 Low back pain: Secondary | ICD-10-CM | POA: Diagnosis present

## 2018-08-22 DIAGNOSIS — M6281 Muscle weakness (generalized): Secondary | ICD-10-CM | POA: Diagnosis present

## 2018-08-22 DIAGNOSIS — R293 Abnormal posture: Secondary | ICD-10-CM | POA: Diagnosis present

## 2018-08-22 DIAGNOSIS — G8929 Other chronic pain: Secondary | ICD-10-CM | POA: Insufficient documentation

## 2018-08-22 NOTE — Therapy (Signed)
[place] MAIN [facility] SERVICES [address] [place], [place], [address] Phone: [phone]   Fax:  [phone]  Physical Therapy Treatment / RECERT/ Physical Therapy Progress Note   Dates of reporting period  07/30/18  to  08/27/18  Patient Details  Name: Susan Callahan MRN: [id number] Date of Birth: [birth year] Referring Provider (PT): Francesco Sor   Encounter Date: 08/22/2018  PT End of Session - 08/22/18 1237    Visit Number  9    Number of Visits  17    Date for PT Re-Evaluation  09/19/18    Authorization Type  2/10 PN 11/19 next 1/10 start 08/22/18    PT Start Time  1115    PT Stop Time  1200    PT Time Calculation (min)  45 min    Activity Tolerance  Patient tolerated treatment well    Behavior During Therapy  [facility] for tasks assessed/performed       Past Medical History:  Diagnosis Date  . COPD (chronic obstructive pulmonary disease) ([place])   . Depression 1974  . Diabetes mellitus without complication ([place])   . Hypertension   . Migraine   . 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 LUMBAR PERITONEAL SHUNT    . TYMPANOSTOMY TUBE PLACEMENT      There were no vitals filed for this visit.  Subjective Assessment - 08/22/18 1130    Subjective  Patient has not been seen since November 26th for one visit and November 19th prior to that.  Missed last weeks session from family visiting and multiple doctor appointments from rash. Dermatologist has taken biopsy, results not back yet.  Although patient has been absent she has been compliant with HEP.     Pertinent History  Pain began in 2007 and has gradually got worse and has progressed to nonstop. Patient states pain last all day long, everyday with no change in symptoms. Medication helps briefly. States she has difficulty with showering and standing. States she has to stand with her knee bent due to pulling sensation in legs.  Pain begins immediately when stands and begins to walk. Pain feels better when she bends her knees when standing. Pain became constant and non stop around Dec 2018. Denies bowel or bladder changes, unexplained weakness, or unexpected weight loss/gain. She currently goes to pulmonary rehab 3x a week, but has increased pain with treadmill walking. Will walk with cane on occasion around house especially at night due to occasional dizziness and vision impairments in medical history. States she also has difficulty with carrying groceries up steps due to low back pain. She would like to be able to walk better and have less pain to be less reliant on medication.     Limitations  Lifting;Standing;Walking;House hold activities    How long can you stand comfortably?  pain begins immediately     How long can you walk comfortably?  pain begins immediately     Patient Stated Goals  walk better, have less pain    Currently in Pain?  Yes    Pain Score  2     Pain Location  Back    Pain Orientation  Lower    Pain Descriptors / Indicators  Aching;Burning    Pain Type  Chronic pain    Pain Onset  More than a month ago    Pain Frequency  Intermittent    Aggravating Factors   standing,  walking    Pain Relieving Factors  sitting, rest medications      Patient's condition has the potential to improve in response to therapy. Maximum improvement is yet to be obtained. The anticipated improvement is attainable and reasonable in a generally predictable time.  Patient reports she has been compliant with HEP but has been having increased pain due to [place] and not having therapy due to health problems this past month.     current pain 2/10:   Goals:  VAS: 9/10 pain when went to the dump ambulate on treadmill 10 minutes: last on treadmill 3 weeks ago LE strength: fair: 4/5 MODI: 52%   Proper lifting technique: lifting and tossing 3000Gr ball and and 10lb ball to imitate task at home for reduction of  torsion of spine for pain relief.     Manual Therapy Hooklying HS stretch with DF/PF for sciatic nerve mobilityx20 bilateral; CPA UPA T7-L5grade IIfor pain relief and improved mobility. L5CPA most concordant,  R/L inferior mobilization of SIJ- concordant pain with R inferior mobilization   Ther-ex Abduction with GTB in hooklying3second holds x 10;cues to engage core Marches x 10 each LEwith TrA activation. Verbal cues to complete slowly. SLR with opposite LE in hooklying x10 each LE. Cues to engage core   Pt educated throughout session about proper posture and technique with exercises.                 PT Education - 08/22/18 1236    Education Details  exercise technique, goals, POC, lifting technique    Person(s) Educated  Patient    Methods  Explanation;Demonstration;Tactile cues;Verbal cues    Comprehension  Verbalized understanding;Returned demonstration;Verbal cues required;Tactile cues required;Need further instruction       PT Short Term Goals - 08/22/18 1241      PT SHORT TERM GOAL #1   Title  Patient will be independent with completion of HEP to improve ability to complete functional tasks and functional ADLs.    Baseline  10/23: will give HEP next session 11/19: HEP compliance    Time  2    Period  Weeks    Status  Achieved      PT SHORT TERM GOAL #2   Title  Patient will report pain score less than 4/10 on VAS to demonstrate reduction of pain levels with functional tasks.     Baseline  10/23: 6/10 11/19: 5/10 12/12: 9/10     Time  2    Period  Weeks    Status  Partially Met    Target Date  09/05/18        PT Long Term Goals - 08/22/18 1241      PT LONG TERM GOAL #1   Title  Patient will report pain score of <3/10 on VAS for decreased pain levels and ease with functional activities.    Baseline  10/23: 6/10 11/19: 5/10 12/12 9/10    Time  4    Period  Weeks    Status  Partially Met    Target Date  09/19/18      PT LONG TERM  GOAL #2   Title  Patient will be able to ambulate on treadmill for 96mnutes without increase in back/leg pain demonstrating improved community ambulation and treadmill walking at pulmonary rehab    Baseline  10/23: 059mutes increases pain 11/19: 17m29mtes 12/12: has not had the chance to perform    Time  4    Period  Weeks    Status  Partially Met    Target Date  09/19/18      PT LONG TERM GOAL #3   Title  Patient will have decreased modified oswestry score <50% for improved ability to complete household tasks with less pain levels.     Baseline  10/23: 62% 11/19: 42%    Time  4    Period  Weeks    Status  Achieved      PT LONG TERM GOAL #4   Title  Patient will score 4+/5 on BLE MMT to demonstrate improved glute strength and ability to carry groceries with increased ease up/down stairs at home.    Baseline  10/23: 4-/5 grossly 11/19: 4/5 grossly; hamstrings 4-/5 12/12: 4/5 gross    Time  4    Period  Weeks    Status  Partially Met    Target Date  09/19/18      PT LONG TERM GOAL #5   Title  Patient will have decreased modified oswestry score <35% for improved ability to complete household tasks with less pain levels.     Baseline  11/19: 42% 12/12: 52%    Time  4    Period  Weeks    Status  On-going    Target Date  09/19/18            Plan - 08/22/18 1253    Clinical Impression Statement  Patient's goals assessed due to recent absences.  Missed last weeks session from family visiting and multiple doctor appointments from rash resulting in only 2 visits in ~ 1 month. Dermatologist has taken biopsy, results not back yet.  Although patient has been absent she has been compliant with HEP.  Patient's pain has generally been lower however she had one episode of 9/10 when [place] the dump. Due to patient's absence from physical therapy for health problems she has had regression indicating how physical therapy has resulted in progress prior to absence. Patient's condition has the  potential to improve in response to therapy. Maximum improvement is yet to be obtained. The anticipated improvement is attainable and reasonable in a generally predictable time. Patient will continue to benefit from skilled physical therapy to improve strength, reduce pain levels with ADLs, and improved quality of life.    Rehab Potential  Fair    Clinical Impairments Affecting Rehab Potential  (+) motivation (-) chronicity of symptoms    PT Frequency  2x / week    PT Duration  4 weeks    PT Treatment/Interventions  ADLs/Self Care Home Management;Cryotherapy;Moist Heat;Traction;Iontophoresis 28m/ml Dexamethasone;Ultrasound;Gait training;Stair training;Functional mobility training;Neuromuscular re-education;Balance training;Therapeutic exercise;Therapeutic activities;Patient/family education;Manual techniques;Energy conservation;Aquatic Therapy;Electrical Stimulation;Passive range of motion;Dry needling;Taping    PT Next Visit Plan  nerve glides, strengthening    PT Home Exercise Plan  see sheet    Consulted and Agree with Plan of Care  Patient       Patient will benefit from skilled therapeutic intervention in order to improve the following deficits and impairments:  Abnormal gait, Cardiopulmonary status limiting activity, Decreased activity tolerance, Decreased balance, Decreased endurance, Decreased range of motion, Difficulty walking, Decreased strength, Hypomobility, Decreased mobility, Impaired perceived functional ability, Impaired flexibility, Postural dysfunction, Obesity, Improper body mechanics, Pain  Visit Diagnosis: Chronic low back pain, unspecified back pain laterality, unspecified whether sciatica present  Abnormal posture  Muscle weakness (generalized)  Other abnormalities of gait and mobility     Problem List Patient Active Problem List   Diagnosis Date Noted  .  Elevated rheumatoid factor 08/13/2018  . Elevated C-reactive protein (CRP) 08/12/2018  . Elevated sed rate  08/12/2018  . Lumbar facet joint syndrome 08/12/2018  . Back pain with left-sided radiculopathy 08/12/2018  . DDD (degenerative disc disease), lumbar 08/12/2018  . Chronic lower extremity pain (Primary Area of Pain) (Bilateral) (L>R) 07/23/2018  . Chronic low back pain (Secondary Area of Pain) (Bilateral) (L>R) w/ sciatica (Bilateral) 07/23/2018  . Chronic pain syndrome 07/23/2018  . Pharmacologic therapy 07/23/2018  . Disorder of skeletal system 07/23/2018  . Problems influencing health status 07/23/2018  . Chronic sacroiliac joint pain [facility] Area of Pain) (Bilateral) (L>R) 07/23/2018  . Status post lumboperitoneal shunt placement 02/26/2018  . 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 Tympanic membrane perforation 05/30/2016  . PMB (postmenopausal bleeding) 05/26/2016  . Rash 12/27/2015  . 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 Plantar fasciitis of foot (Left) 05/26/2015  . Spondylosis of lumbar region without myelopathy or radiculopathy 05/26/2015  . Encounter for screening mammogram for malignant neoplasm of breast 12/02/2014  . Goiter 12/02/2014  . Chronic idiopathic gout of foot (Right) 07/06/2014  . Biceps tendonitis (Right) 05/19/2014  . Shoulder pain (Right) 05/19/2014  . Facial weakness 05/05/2014  . Post herpetic neuralgia 05/05/2014  . Chronic dysfunction of left eustachian tube 03/24/2014  . Osteopenia 03/13/2014  . Family history of colon cancer 12/02/2013  . Disequilibrium syndrome 08/26/2013  . Diastasis recti 04/08/2013  . Facial nerve paresis 07/31/2012  . Blepharospasm 05/08/2012  . 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 Obesity 03/28/2012  . Hemoptysis 12/22/2011  . Neuropathic postherpetic trigeminal neuralgia 12/12/2011  . Allergic rhinitis, mild 10/12/2011  . Chronic bronchitis ([place]) 06/05/2011  . Essential hypertension 05/24/2011   . Type 2 diabetes mellitus without complications ([place]) 62/22/9798  . Pulmonary nodules 05/05/2011   Janna Arch, PT, DPT   08/22/2018, 12:54 PM  [place] MAIN [facility] SERVICES [address] [place], [place], [address] Phone: [phone]   Fax:  [phone]  Name: Bettylou Frew MRN: [id number] Date of Birth: [birth year]

## 2018-08-27 ENCOUNTER — Ambulatory Visit: Payer: Medicare Other

## 2018-08-28 DIAGNOSIS — J439 Emphysema, unspecified: Secondary | ICD-10-CM | POA: Insufficient documentation

## 2018-08-29 ENCOUNTER — Ambulatory Visit
Admission: RE | Admit: 2018-08-29 | Discharge: 2018-08-29 | Disposition: A | Discharge status: Home / Self Care | Payer: Medicare Other | Source: Ambulatory Visit | Attending: Pain Medicine | Admitting: Pain Medicine

## 2018-08-29 ENCOUNTER — Ambulatory Visit: Payer: Medicare Other

## 2018-08-29 DIAGNOSIS — G8929 Other chronic pain: Secondary | ICD-10-CM | POA: Insufficient documentation

## 2018-08-29 DIAGNOSIS — M79605 Pain in left leg: Secondary | ICD-10-CM | POA: Diagnosis not present

## 2018-08-29 DIAGNOSIS — M4807 Spinal stenosis, lumbosacral region: Secondary | ICD-10-CM | POA: Insufficient documentation

## 2018-08-29 DIAGNOSIS — M79604 Pain in right leg: Secondary | ICD-10-CM | POA: Insufficient documentation

## 2018-08-29 DIAGNOSIS — M5442 Lumbago with sciatica, left side: Secondary | ICD-10-CM | POA: Insufficient documentation

## 2018-08-29 DIAGNOSIS — M4316 Spondylolisthesis, lumbar region: Secondary | ICD-10-CM | POA: Diagnosis not present

## 2018-08-29 DIAGNOSIS — M5441 Lumbago with sciatica, right side: Secondary | ICD-10-CM | POA: Insufficient documentation

## 2018-08-29 DIAGNOSIS — M5410 Radiculopathy, site unspecified: Secondary | ICD-10-CM | POA: Diagnosis not present

## 2018-08-29 DIAGNOSIS — M8938 Hypertrophy of bone, other site: Secondary | ICD-10-CM | POA: Diagnosis not present

## 2018-08-29 DIAGNOSIS — M47816 Spondylosis without myelopathy or radiculopathy, lumbar region: Secondary | ICD-10-CM | POA: Diagnosis not present

## 2018-08-29 DIAGNOSIS — M5126 Other intervertebral disc displacement, lumbar region: Secondary | ICD-10-CM | POA: Insufficient documentation

## 2018-08-29 DIAGNOSIS — M48061 Spinal stenosis, lumbar region without neurogenic claudication: Secondary | ICD-10-CM | POA: Diagnosis not present

## 2018-08-29 DIAGNOSIS — M5136 Other intervertebral disc degeneration, lumbar region: Secondary | ICD-10-CM | POA: Diagnosis present

## 2018-08-29 DIAGNOSIS — M545 Low back pain: Principal | ICD-10-CM

## 2018-08-29 DIAGNOSIS — M6281 Muscle weakness (generalized): Secondary | ICD-10-CM

## 2018-08-29 DIAGNOSIS — R2689 Other abnormalities of gait and mobility: Secondary | ICD-10-CM

## 2018-08-29 DIAGNOSIS — R293 Abnormal posture: Secondary | ICD-10-CM

## 2018-08-29 NOTE — Therapy (Signed)
[place] MAIN [facility] SERVICES [address] [place], [place], [address] Phone: [phone]   Fax:  [phone]  Physical Therapy Treatment  Patient Details  Name: Susan Callahan MRN: [id number] Date of Birth: 1950/09/19 Referring Provider (PT): Francesco Sor   Encounter Date: 08/29/2018  PT End of Session - 08/29/18 0827    Visit Number  10    Number of Visits  17    Date for PT Re-Evaluation  09/19/18    Authorization Type  1/10 start 08/22/18    PT Start Time  0800    PT Stop Time  0845    PT Time Calculation (min)  45 min    Activity Tolerance  Patient tolerated treatment well    Behavior During Therapy  [facility] for tasks assessed/performed       Past Medical History:  Diagnosis Date  . COPD (chronic obstructive pulmonary disease) ([place])   . Depression 1974  . Diabetes mellitus without complication ([place])   . Hypertension   . Migraine   . 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 LUMBAR PERITONEAL SHUNT    . TYMPANOSTOMY TUBE PLACEMENT      There were no vitals filed for this visit.  Subjective Assessment - 08/29/18 0802    Subjective  Patient is getting an MRI this afternoon. Had to change her appointment time due to having to go to the vet.  Yesterday went to dump and practiced her proper lifting technique. Had a hard time lifting it up and over due to distance from dump.  [employer] shopping afterwards and now is feeling it today.     Pertinent History  Pain began in 2007 and has gradually got worse and has progressed to nonstop. Patient states pain last all day long, everyday with no change in symptoms. Medication helps briefly. States she has difficulty with showering and standing. States she has to stand with her knee bent due to pulling sensation in legs. Pain begins immediately when stands and begins to walk. Pain feels better when she bends her knees when standing.  Pain became constant and non stop around Dec 2018. Denies bowel or bladder changes, unexplained weakness, or unexpected weight loss/gain. She currently goes to pulmonary rehab 3x a week, but has increased pain with treadmill walking. Will walk with cane on occasion around house especially at night due to occasional dizziness and vision impairments in medical history. States she also has difficulty with carrying groceries up steps due to low back pain. She would like to be able to walk better and have less pain to be less reliant on medication.     Limitations  Lifting;Standing;Walking;House hold activities    How long can you stand comfortably?  pain begins immediately     How long can you walk comfortably?  pain begins immediately     Patient Stated Goals  walk better, have less pain    Currently in Pain?  Yes    Pain Score  6     Pain Location  Back    Pain Orientation  Lower    Pain Descriptors / Indicators  Aching;Burning    Pain Type  Chronic pain    Pain Onset  More than a month ago    Pain Frequency  Intermittent        Manual Therapy  L/R single knee to chest stretch x 30s, ; Hooklying HS stretch with DF/PF  for sciatic nerve mobility x20 bilateral; CPA UPA T7-L5 grade II for pain relief and improved mobility. L5 CPA most concordant,  R/L inferior mobilization of SIJ- concordant pain with R inferior mobilization  prone hip flexor stretch 2x 30 seconds each LE Roller to lumbar and thoracic paraspinals x 3 minutes.    Ther-ex TrA activation in seated position 10x 3 second holds, cues for touching abdomen  TrA activation with ambulation 10 ft for carryover to heart track. Abduction with GTB in hooklying 3 second holds x 10; cues to engage core Marches x 10 each LE with TrA activation. Verbal cues to complete slowly. SLR with opposite LE in hooklying x10 each LE. Cues to engage core Bridges x10.  Verbal cues for exercise technique with trA and gluteal activation for reduction of  back  Seated sciatic nerve glides x10 each LE. Visual and verbal cues for technique   15lb kettlebell swings : 10x to practice tossing kitty litter into dump to avoid twist.                      PT Education - 08/29/18 0826    Education Details  exercise technique, manual, TrA activation in sitting and standing, kettlebell swings    Person(s) Educated  Patient    Methods  Explanation;Demonstration    Comprehension  Verbalized understanding;Returned demonstration;Need further instruction       PT Short Term Goals - 08/22/18 1241      PT SHORT TERM GOAL #1   Title  Patient will be independent with completion of HEP to improve ability to complete functional tasks and functional ADLs.    Baseline  10/23: will give HEP next session 11/19: HEP compliance    Time  2    Period  Weeks    Status  Achieved      PT SHORT TERM GOAL #2   Title  Patient will report pain score less than 4/10 on VAS to demonstrate reduction of pain levels with functional tasks.     Baseline  10/23: 6/10 11/19: 5/10 12/12: 9/10     Time  2    Period  Weeks    Status  Partially Met    Target Date  09/05/18        PT Long Term Goals - 08/22/18 1241      PT LONG TERM GOAL #1   Title  Patient will report pain score of <3/10 on VAS for decreased pain levels and ease with functional activities.    Baseline  10/23: 6/10 11/19: 5/10 12/12 9/10    Time  4    Period  Weeks    Status  Partially Met    Target Date  09/19/18      PT LONG TERM GOAL #2   Title  Patient will be able to ambulate on treadmill for 60mnutes without increase in back/leg pain demonstrating improved community ambulation and treadmill walking at pulmonary rehab    Baseline  10/23: 051mutes increases pain 11/19: 62m81mtes 12/12: has not had the chance to perform    Time  4    Period  Weeks    Status  Partially Met    Target Date  09/19/18      PT LONG TERM GOAL #3   Title  Patient will have decreased modified oswestry  score <50% for improved ability to complete household tasks with less pain levels.     Baseline  10/23: 62% 11/19: 42%    Time  4    Period  Weeks    Status  Achieved      PT LONG TERM GOAL #4   Title  Patient will score 4+/5 on BLE MMT to demonstrate improved glute strength and ability to carry groceries with increased ease up/down stairs at home.    Baseline  10/23: 4-/5 grossly 11/19: 4/5 grossly; hamstrings 4-/5 12/12: 4/5 gross    Time  4    Period  Weeks    Status  Partially Met    Target Date  09/19/18      PT LONG TERM GOAL #5   Title  Patient will have decreased modified oswestry score <35% for improved ability to complete household tasks with less pain levels.     Baseline  11/19: 42% 12/12: 52%    Time  4    Period  Weeks    Status  On-going    Target Date  09/19/18            Plan - 08/29/18 0839    Clinical Impression Statement  Patient educated on proper lifting technique for dump transfer once a week to reduce torsion on back for pain control.  Patient educated on TrA activation in sitting and standing positions for carryover with heart track exercises. Patient will continue to benefit from skilled physical therapy to improve strength, reduce pain levels with ADLs, and improved quality of life.     Rehab Potential  Fair    Clinical Impairments Affecting Rehab Potential  (+) motivation (-) chronicity of symptoms    PT Frequency  2x / week    PT Duration  4 weeks    PT Treatment/Interventions  ADLs/Self Care Home Management;Cryotherapy;Moist Heat;Traction;Iontophoresis '4mg'$ /ml Dexamethasone;Ultrasound;Gait training;Stair training;Functional mobility training;Neuromuscular re-education;Balance training;Therapeutic exercise;Therapeutic activities;Patient/family education;Manual techniques;Energy conservation;Aquatic Therapy;Electrical Stimulation;Passive range of motion;Dry needling;Taping    PT Next Visit Plan  nerve glides, strengthening    PT Home Exercise Plan  see  sheet    Consulted and Agree with Plan of Care  Patient       Patient will benefit from skilled therapeutic intervention in order to improve the following deficits and impairments:  Abnormal gait, Cardiopulmonary status limiting activity, Decreased activity tolerance, Decreased balance, Decreased endurance, Decreased range of motion, Difficulty walking, Decreased strength, Hypomobility, Decreased mobility, Impaired perceived functional ability, Impaired flexibility, Postural dysfunction, Obesity, Improper body mechanics, Pain  Visit Diagnosis: Chronic low back pain, unspecified back pain laterality, unspecified whether sciatica present  Abnormal posture  Muscle weakness (generalized)  Other abnormalities of gait and mobility     Problem List Patient Active Problem List   Diagnosis Date Noted  . Elevated rheumatoid factor 08/13/2018  . Elevated C-reactive protein (CRP) 08/12/2018  . Elevated sed rate 08/12/2018  . Lumbar facet joint syndrome 08/12/2018  . Back pain with left-sided radiculopathy 08/12/2018  . DDD (degenerative disc disease), lumbar 08/12/2018  . Chronic lower extremity pain (Primary Area of Pain) (Bilateral) (L>R) 07/23/2018  . Chronic low back pain (Secondary Area of Pain) (Bilateral) (L>R) w/ sciatica (Bilateral) 07/23/2018  . Chronic pain syndrome 07/23/2018  . Pharmacologic therapy 07/23/2018  . Disorder of skeletal system 07/23/2018  . Problems influencing health status 07/23/2018  . Chronic sacroiliac joint pain [facility] Area of Pain) (Bilateral) (L>R) 07/23/2018  . Status post lumboperitoneal shunt placement 02/26/2018  . 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 Tympanic membrane perforation 05/30/2016  . PMB (postmenopausal bleeding) 05/26/2016  . Rash 12/27/2015  . 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 Plantar fasciitis of foot (Left) 05/26/2015  . Spondylosis of lumbar region  without myelopathy or radiculopathy 05/26/2015  . Encounter for screening mammogram for malignant neoplasm of breast 12/02/2014  . Goiter 12/02/2014  . Chronic idiopathic gout of foot (Right) 07/06/2014  . Biceps tendonitis (Right) 05/19/2014  . Shoulder pain (Right) 05/19/2014  . Facial weakness 05/05/2014  . Post herpetic neuralgia 05/05/2014  . Chronic dysfunction of left eustachian tube 03/24/2014  . Osteopenia 03/13/2014  . Family history of colon cancer 12/02/2013  . Disequilibrium syndrome 08/26/2013  . Diastasis recti 04/08/2013  . Facial nerve paresis 07/31/2012  . Blepharospasm 05/08/2012  . 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 Obesity 03/28/2012  . Hemoptysis 12/22/2011  . Neuropathic postherpetic trigeminal neuralgia 12/12/2011  . Allergic rhinitis, mild 10/12/2011  . Chronic bronchitis ([place]) 06/05/2011  . Essential hypertension 05/24/2011  . Type 2 diabetes mellitus without complications ([place]) 31/54/0086  . Pulmonary nodules 05/05/2011   Janna Arch, PT, DPT   08/29/2018, 8:45 AM  [place] MAIN [facility] SERVICES [address] [place], [place], [address] Phone: [phone]   Fax:  [phone]  Name: Susan Callahan MRN: [id number] Date of Birth: June 18, 1951

## 2018-09-06 ENCOUNTER — Ambulatory Visit: Payer: Medicare Other

## 2018-09-06 DIAGNOSIS — M545 Low back pain, unspecified: Secondary | ICD-10-CM

## 2018-09-06 DIAGNOSIS — R293 Abnormal posture: Secondary | ICD-10-CM

## 2018-09-06 DIAGNOSIS — M6281 Muscle weakness (generalized): Secondary | ICD-10-CM

## 2018-09-06 DIAGNOSIS — G8929 Other chronic pain: Secondary | ICD-10-CM

## 2018-09-06 DIAGNOSIS — R2689 Other abnormalities of gait and mobility: Secondary | ICD-10-CM

## 2018-09-06 NOTE — Therapy (Signed)
[place] MAIN [facility] SERVICES [address] [place], [place], [address] Phone: [phone]   Fax:  [phone]  Physical Therapy Treatment  Patient Details  Name: Susan Callahan MRN: [id number] Date of Birth: 07-04-1951 Referring Provider (PT): Francesco Sor   Encounter Date: 09/06/2018  PT End of Session - 09/06/18 1131    Visit Number  11    Number of Visits  17    Date for PT Re-Evaluation  09/19/18    Authorization Type  2/10 start 08/22/18    PT Start Time  1051    PT Stop Time  1129    PT Time Calculation (min)  38 min    Activity Tolerance  Patient tolerated treatment well    Behavior During Therapy  [facility] for tasks assessed/performed       Past Medical History:  Diagnosis Date  . COPD (chronic obstructive pulmonary disease) ([place])   . Depression 1974  . Diabetes mellitus without complication ([place])   . Hypertension   . Migraine   . 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 LUMBAR PERITONEAL SHUNT    . TYMPANOSTOMY TUBE PLACEMENT      There were no vitals filed for this visit. Patient arrived at wrong time for session limiting session duration.  Subjective Assessment - 09/06/18 1056    Subjective  Patient used a broom on her deck yesterday, didn't hurt as bad at first but last night got bad. Worked out yesterday, did UBE and weights. Patient had MRI but has not had an appointment to discuss findings for another two weeks.     Pertinent History  Pain began in 2007 and has gradually got worse and has progressed to nonstop. Patient states pain last all day long, everyday with no change in symptoms. Medication helps briefly. States she has difficulty with showering and standing. States she has to stand with her knee bent due to pulling sensation in legs. Pain begins immediately when stands and begins to walk. Pain feels better when she bends her knees when standing. Pain became  constant and non stop around Dec 2018. Denies bowel or bladder changes, unexplained weakness, or unexpected weight loss/gain. She currently goes to pulmonary rehab 3x a week, but has increased pain with treadmill walking. Will walk with cane on occasion around house especially at night due to occasional dizziness and vision impairments in medical history. States she also has difficulty with carrying groceries up steps due to low back pain. She would like to be able to walk better and have less pain to be less reliant on medication.     Limitations  Lifting;Standing;Walking;House hold activities    How long can you stand comfortably?  pain begins immediately     How long can you walk comfortably?  pain begins immediately     Patient Stated Goals  walk better, have less pain    Currently in Pain?  Yes    Pain Score  6     Pain Location  Back    Pain Orientation  Lower    Pain Descriptors / Indicators  Aching;Burning    Pain Type  Chronic pain    Pain Onset  More than a month ago    Pain Frequency  Intermittent       Manual Therapy  L/R single knee to chest stretch x 30s, ; Hooklying HS stretch with DF/PF for sciatic nerve mobility x20 bilateral;  prone hip flexor stretch 2x 30 seconds each LE Roller to lumbar and thoracic paraspinals x 3 minutes.    Ther-ex  Review New HEP: Access Code: [email or web address]  URL: [email or web address]  Date: 08/29/2018  Prepared by: Janna Arch   Exercises  Supine Active Straight Leg Raise - 10 reps - 1 sets - 3 hold - 1x daily - 7x weekly  Seated Transversus Abdominis Bracing - 10 reps - 2 sets - 5 hold - 1x daily - 7x weekly  Standing Transverse Abdominis Contraction - 10 reps - 2 sets - 5 hold - 1x daily - 7x weekly  Kettlebell Swing - 10 reps - 2 sets - 5 hold - 1x daily - 7x weekly  Hooklying Isometric Clamshell - 10 reps - 2 sets - 5 hold - 1x daily - 7x weekly  Supine March - 10 reps - 2 sets - 5 hold - 1x daily - 7x weekly   Seated:  swiss ball forward rollout : 10x 10 second holds, lateral L and R 10x each direction 6-8 second holds   Seated sciatic nerve glides x10 each LE. Visual and verbal cues for technique                            PT Education - 09/06/18 1057    Education Details  exercise technique, manual, TrA activation, HEP     Person(s) Educated  Patient    Methods  Explanation;Demonstration;Tactile cues;Verbal cues;Handout    Comprehension  Verbalized understanding;Returned demonstration;Tactile cues required;Need further instruction;Verbal cues required       PT Short Term Goals - 08/22/18 1241      PT SHORT TERM GOAL #1   Title  Patient will be independent with completion of HEP to improve ability to complete functional tasks and functional ADLs.    Baseline  10/23: will give HEP next session 11/19: HEP compliance    Time  2    Period  Weeks    Status  Achieved      PT SHORT TERM GOAL #2   Title  Patient will report pain score less than 4/10 on VAS to demonstrate reduction of pain levels with functional tasks.     Baseline  10/23: 6/10 11/19: 5/10 12/12: 9/10     Time  2    Period  Weeks    Status  Partially Met    Target Date  09/05/18        PT Long Term Goals - 08/22/18 1241      PT LONG TERM GOAL #1   Title  Patient will report pain score of <3/10 on VAS for decreased pain levels and ease with functional activities.    Baseline  10/23: 6/10 11/19: 5/10 12/12 9/10    Time  4    Period  Weeks    Status  Partially Met    Target Date  09/19/18      PT LONG TERM GOAL #2   Title  Patient will be able to ambulate on treadmill for 35mnutes without increase in back/leg pain demonstrating improved community ambulation and treadmill walking at pulmonary rehab    Baseline  10/23: 012mutes increases pain 11/19: 66m53mtes 12/12: has not had the chance to perform    Time  4    Period  Weeks    Status  Partially Met    Target Date  09/19/18      PT LONG TERM GOAL #3  Title  Patient will have decreased modified oswestry score <50% for improved ability to complete household tasks with less pain levels.     Baseline  10/23: 62% 11/19: 42%    Time  4    Period  Weeks    Status  Achieved      PT LONG TERM GOAL #4   Title  Patient will score 4+/5 on BLE MMT to demonstrate improved glute strength and ability to carry groceries with increased ease up/down stairs at home.    Baseline  10/23: 4-/5 grossly 11/19: 4/5 grossly; hamstrings 4-/5 12/12: 4/5 gross    Time  4    Period  Weeks    Status  Partially Met    Target Date  09/19/18      PT LONG TERM GOAL #5   Title  Patient will have decreased modified oswestry score <35% for improved ability to complete household tasks with less pain levels.     Baseline  11/19: 42% 12/12: 52%    Time  4    Period  Weeks    Status  On-going    Target Date  09/19/18            Plan - 09/06/18 1132    Clinical Impression Statement   Patient arrived at wrong time for session limiting session duration. Patient has had an MRI since last session however due to patient not having conversed with physician about results yet this therapist did not discuss what was in the notes. Patient's pain improved after manual and therex to 0/10 by end of session and patient demonstrated understanding of new HEP. Patient will continue to benefit from skilled physical therapy to improve strength, reduce pain levels with ADLs, and improved quality of life.     Rehab Potential  Fair    Clinical Impairments Affecting Rehab Potential  (+) motivation (-) chronicity of symptoms    PT Frequency  2x / week    PT Duration  4 weeks    PT Treatment/Interventions  ADLs/Self Care Home Management;Cryotherapy;Moist Heat;Traction;Iontophoresis 42m/ml Dexamethasone;Ultrasound;Gait training;Stair training;Functional mobility training;Neuromuscular re-education;Balance training;Therapeutic exercise;Therapeutic activities;Patient/family education;Manual  techniques;Energy conservation;Aquatic Therapy;Electrical Stimulation;Passive range of motion;Dry needling;Taping    PT Next Visit Plan  nerve glides, strengthening    PT Home Exercise Plan  see sheet    Consulted and Agree with Plan of Care  Patient       Patient will benefit from skilled therapeutic intervention in order to improve the following deficits and impairments:  Abnormal gait, Cardiopulmonary status limiting activity, Decreased activity tolerance, Decreased balance, Decreased endurance, Decreased range of motion, Difficulty walking, Decreased strength, Hypomobility, Decreased mobility, Impaired perceived functional ability, Impaired flexibility, Postural dysfunction, Obesity, Improper body mechanics, Pain  Visit Diagnosis: Chronic low back pain, unspecified back pain laterality, unspecified whether sciatica present  Abnormal posture  Muscle weakness (generalized)  Other abnormalities of gait and mobility     Problem List Patient Active Problem List   Diagnosis Date Noted  . Elevated rheumatoid factor 08/13/2018  . Elevated C-reactive protein (CRP) 08/12/2018  . Elevated sed rate 08/12/2018  . Lumbar facet joint syndrome 08/12/2018  . Back pain with left-sided radiculopathy 08/12/2018  . DDD (degenerative disc disease), lumbar 08/12/2018  . Chronic lower extremity pain (Primary Area of Pain) (Bilateral) (L>R) 07/23/2018  . Chronic low back pain (Secondary Area of Pain) (Bilateral) (L>R) w/ sciatica (Bilateral) 07/23/2018  . Chronic pain syndrome 07/23/2018  . Pharmacologic therapy 07/23/2018  . Disorder of skeletal system 07/23/2018  .  Problems influencing health status 07/23/2018  . Chronic sacroiliac joint pain [facility] Area of Pain) (Bilateral) (L>R) 07/23/2018  . Status post lumboperitoneal shunt placement 02/26/2018  . 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 Tympanic membrane perforation 05/30/2016  . PMB (postmenopausal  bleeding) 05/26/2016  . Rash 12/27/2015  . 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 Plantar fasciitis of foot (Left) 05/26/2015  . Spondylosis of lumbar region without myelopathy or radiculopathy 05/26/2015  . Encounter for screening mammogram for malignant neoplasm of breast 12/02/2014  . Goiter 12/02/2014  . Chronic idiopathic gout of foot (Right) 07/06/2014  . Biceps tendonitis (Right) 05/19/2014  . Shoulder pain (Right) 05/19/2014  . Facial weakness 05/05/2014  . Post herpetic neuralgia 05/05/2014  . Chronic dysfunction of left eustachian tube 03/24/2014  . Osteopenia 03/13/2014  . Family history of colon cancer 12/02/2013  . Disequilibrium syndrome 08/26/2013  . Diastasis recti 04/08/2013  . Facial nerve paresis 07/31/2012  . Blepharospasm 05/08/2012  . 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 Obesity 03/28/2012  . Hemoptysis 12/22/2011  . Neuropathic postherpetic trigeminal neuralgia 12/12/2011  . Allergic rhinitis, mild 10/12/2011  . Chronic bronchitis ([place]) 06/05/2011  . Essential hypertension 05/24/2011  . Type 2 diabetes mellitus without complications ([place]) 12/45/8099  . Pulmonary nodules 05/05/2011   Janna Arch, PT, DPT   09/06/2018, 11:33 AM  [place] MAIN [facility] SERVICES [address] [place], [place], [address] Phone: [phone]   Fax:  [phone]  Name: Susan Callahan MRN: [id number] Date of Birth: 1951-05-20

## 2018-09-10 ENCOUNTER — Ambulatory Visit: Payer: Medicare Other

## 2018-09-10 DIAGNOSIS — M545 Low back pain, unspecified: Secondary | ICD-10-CM

## 2018-09-10 DIAGNOSIS — R2689 Other abnormalities of gait and mobility: Secondary | ICD-10-CM

## 2018-09-10 DIAGNOSIS — M6281 Muscle weakness (generalized): Secondary | ICD-10-CM

## 2018-09-10 DIAGNOSIS — G8929 Other chronic pain: Secondary | ICD-10-CM

## 2018-09-10 DIAGNOSIS — R293 Abnormal posture: Secondary | ICD-10-CM

## 2018-09-10 NOTE — Therapy (Signed)
[place] MAIN [facility] SERVICES [address] [place], [place], [address] Phone: [phone]   Fax:  [phone]  Physical Therapy Treatment  Patient Details  Name: Susan Callahan MRN: [id number] Date of Birth: December 10, 1950 Referring Provider (PT): Francesco Sor   Encounter Date: 09/10/2018  PT End of Session - 09/10/18 1431    Visit Number  12    Number of Visits  17    Date for PT Re-Evaluation  09/19/18    Authorization Type  3/10 start 08/22/18    PT Start Time  1430    PT Stop Time  1514    PT Time Calculation (min)  44 min    Equipment Utilized During Treatment  Gait belt    Activity Tolerance  Patient tolerated treatment well    Behavior During Therapy  [facility] for tasks assessed/performed       Past Medical History:  Diagnosis Date  . COPD (chronic obstructive pulmonary disease) ([place])   . Depression 1974  . Diabetes mellitus without complication ([place])   . Hypertension   . Migraine   . 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 LUMBAR PERITONEAL SHUNT    . TYMPANOSTOMY TUBE PLACEMENT      There were no vitals filed for this visit.  Subjective Assessment - 09/10/18 1434    Subjective  Pt reported that she had her shunt adjusted this morning. Michela Pitcher it was performed at [facility]. No questions about HEP at this time.    Pertinent History  Pain began in 2007 and has gradually got worse and has progressed to nonstop. Patient states pain last all day long, everyday with no change in symptoms. Medication helps briefly. States she has difficulty with showering and standing. States she has to stand with her knee bent due to pulling sensation in legs. Pain begins immediately when stands and begins to walk. Pain feels better when she bends her knees when standing. Pain became constant and non stop around Dec 2018. Denies bowel or bladder changes, unexplained weakness, or unexpected  weight loss/gain. She currently goes to pulmonary rehab 3x a week, but has increased pain with treadmill walking. Will walk with cane on occasion around house especially at night due to occasional dizziness and vision impairments in medical history. States she also has difficulty with carrying groceries up steps due to low back pain. She would like to be able to walk better and have less pain to be less reliant on medication.     Limitations  Lifting;Standing;Walking;House hold activities    How long can you stand comfortably?  pain begins immediately     How long can you walk comfortably?  pain begins immediately     Patient Stated Goals  walk better, have less pain    Currently in Pain?  Yes    Pain Score  3     Pain Location  Back    Pain Orientation  Lower;Posterior    Pain Descriptors / Indicators  Sharp;Aching;Burning    Pain Type  Chronic pain    Pain Radiating Towards  bilateral LEs    Pain Onset  More than a month ago      TREATMENT:  Therapeutic exercises: performed with verbal/visual cues, CGA for seated on swiss ball activities. Goal: improve mobility, address neural tension   L/R single knee to chest stretch  2x 30s, ; Seated swiss ball forward roll out 10 x10sec  holds Seated sciatic nerve glides x10 ea LE, verbal/visual cues Seated on swiss ball pelvic anterior/posterior/lateral movements x15 ea direction (pain with anterior tilt, held) Hooklying HS stretch with DF/PF for sciatic nerve mobility x20 bilateral; prone hip flexor stretch 2x 30 seconds each LE   Manual therapy:  Roller to lumbar and thoracic paraspinals for muscle tension, pain relief x 5 minutes    PT Education - 09/10/18 1430    Education Details  exercise technique/form, TrA activation    Person(s) Educated  Patient    Methods  Explanation;Demonstration;Tactile cues;Verbal cues    Comprehension  Returned demonstration       PT Short Term Goals - 08/22/18 1241      PT SHORT TERM GOAL #1   Title   Patient will be independent with completion of HEP to improve ability to complete functional tasks and functional ADLs.    Baseline  10/23: will give HEP next session 11/19: HEP compliance    Time  2    Period  Weeks    Status  Achieved      PT SHORT TERM GOAL #2   Title  Patient will report pain score less than 4/10 on VAS to demonstrate reduction of pain levels with functional tasks.     Baseline  10/23: 6/10 11/19: 5/10 12/12: 9/10     Time  2    Period  Weeks    Status  Partially Met    Target Date  09/05/18        PT Long Term Goals - 08/22/18 1241      PT LONG TERM GOAL #1   Title  Patient will report pain score of <3/10 on VAS for decreased pain levels and ease with functional activities.    Baseline  10/23: 6/10 11/19: 5/10 12/12 9/10    Time  4    Period  Weeks    Status  Partially Met    Target Date  09/19/18      PT LONG TERM GOAL #2   Title  Patient will be able to ambulate on treadmill for 24mnutes without increase in back/leg pain demonstrating improved community ambulation and treadmill walking at pulmonary rehab    Baseline  10/23: 026mutes increases pain 11/19: 70m9mtes 12/12: has not had the chance to perform    Time  4    Period  Weeks    Status  Partially Met    Target Date  09/19/18      PT LONG TERM GOAL #3   Title  Patient will have decreased modified oswestry score <50% for improved ability to complete household tasks with less pain levels.     Baseline  10/23: 62% 11/19: 42%    Time  4    Period  Weeks    Status  Achieved      PT LONG TERM GOAL #4   Title  Patient will score 4+/5 on BLE MMT to demonstrate improved glute strength and ability to carry groceries with increased ease up/down stairs at home.    Baseline  10/23: 4-/5 grossly 11/19: 4/5 grossly; hamstrings 4-/5 12/12: 4/5 gross    Time  4    Period  Weeks    Status  Partially Met    Target Date  09/19/18      PT LONG TERM GOAL #5   Title  Patient will have decreased modified  oswestry score <35% for improved ability to complete household tasks with less pain levels.  Baseline  11/19: 42% 12/12: 52%    Time  4    Period  Weeks    Status  On-going    Target Date  09/19/18            Plan - 09/10/18 1600    Clinical Impression Statement  Pt with mild pain this session, though reports that her pain relief lasts less than 10 minutes after therapy. Able to progress core stabilization activities without complaint and good form. Pt reported improved symptoms at end of therapy session. Pt has still not had a chance to review MRI results with physician, encouraged to follow up with doctor.     Rehab Potential  Fair    Clinical Impairments Affecting Rehab Potential  (+) motivation (-) chronicity of symptoms    PT Frequency  2x / week    PT Duration  4 weeks    PT Treatment/Interventions  ADLs/Self Care Home Management;Cryotherapy;Moist Heat;Traction;Iontophoresis 3m/ml Dexamethasone;Ultrasound;Gait training;Stair training;Functional mobility training;Neuromuscular re-education;Balance training;Therapeutic exercise;Therapeutic activities;Patient/family education;Manual techniques;Energy conservation;Aquatic Therapy;Electrical Stimulation;Passive range of motion;Dry needling;Taping       Patient will benefit from skilled therapeutic intervention in order to improve the following deficits and impairments:  Abnormal gait, Cardiopulmonary status limiting activity, Decreased activity tolerance, Decreased balance, Decreased endurance, Decreased range of motion, Difficulty walking, Decreased strength, Hypomobility, Decreased mobility, Impaired perceived functional ability, Impaired flexibility, Postural dysfunction, Obesity, Improper body mechanics, Pain  Visit Diagnosis: Chronic low back pain, unspecified back pain laterality, unspecified whether sciatica present  Abnormal posture  Muscle weakness (generalized)  Other abnormalities of gait and mobility     Problem  List Patient Active Problem List   Diagnosis Date Noted  . Elevated rheumatoid factor 08/13/2018  . Elevated C-reactive protein (CRP) 08/12/2018  . Elevated sed rate 08/12/2018  . Lumbar facet joint syndrome 08/12/2018  . Back pain with left-sided radiculopathy 08/12/2018  . DDD (degenerative disc disease), lumbar 08/12/2018  . Chronic lower extremity pain (Primary Area of Pain) (Bilateral) (L>R) 07/23/2018  . Chronic low back pain (Secondary Area of Pain) (Bilateral) (L>R) w/ sciatica (Bilateral) 07/23/2018  . Chronic pain syndrome 07/23/2018  . Pharmacologic therapy 07/23/2018  . Disorder of skeletal system 07/23/2018  . Problems influencing health status 07/23/2018  . Chronic [facility]ac joint [facility]) (Bilateral) (L>R) 07/23/2018  . Status post lumboperitoneal shunt placement 02/26/2018  . 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 Tympanic membrane perforation 05/30/2016  . PMB (postmenopausal bleeding) 05/26/2016  . Rash 12/27/2015  . 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 Plantar fasciitis of foot (Left) 05/26/2015  . Spondylosis of lumbar region without myelopathy or radiculopathy 05/26/2015  . Encounter for screening mammogram for malignant neoplasm of breast 12/02/2014  . Goiter 12/02/2014  . Chronic idiopathic gout of foot (Right) 07/06/2014  . Biceps tendonitis (Right) 05/19/2014  . Shoulder pain (Right) 05/19/2014  . Facial weakness 05/05/2014  . Post herpetic neuralgia 05/05/2014  . Chronic dysfunction of left eustachian tube 03/24/2014  . Osteopenia 03/13/2014  . Family history of colon cancer 12/02/2013  . Disequilibrium syndrome 08/26/2013  . Diastasis recti 04/08/2013  . Facial nerve paresis 07/31/2012  . Blepharospasm 05/08/2012  . 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 Obesity 03/28/2012  . Hemoptysis 12/22/2011  .  Neuropathic postherpetic trigeminal neuralgia 12/12/2011  . Allergic rhinitis, mild 10/12/2011  . [place] 06/05/2011  . Essential hypertension 05/24/2011  .  Type 2 diabetes mellitus without complications ([place]) 73/73/6681  . Pulmonary nodules 05/05/2011    Lieutenant Diego PT, DPT [phone] PM,09/10/18 [phone]  [place] MAIN [facility] SERVICES [address] [place], [place], [address] Phone: [phone]   Fax:  [phone]  Name: Susan Callahan MRN: [id number] Date of Birth: 05-Jan-1951

## 2018-09-13 ENCOUNTER — Ambulatory Visit: Payer: Medicare Other | Attending: Rehabilitation

## 2018-09-13 DIAGNOSIS — G8929 Other chronic pain: Secondary | ICD-10-CM | POA: Diagnosis present

## 2018-09-13 DIAGNOSIS — M545 Low back pain, unspecified: Secondary | ICD-10-CM

## 2018-09-13 DIAGNOSIS — R2689 Other abnormalities of gait and mobility: Secondary | ICD-10-CM

## 2018-09-13 DIAGNOSIS — M6281 Muscle weakness (generalized): Secondary | ICD-10-CM | POA: Diagnosis present

## 2018-09-13 DIAGNOSIS — R293 Abnormal posture: Secondary | ICD-10-CM

## 2018-09-13 NOTE — Therapy (Signed)
[place] MAIN [facility] SERVICES [address] [place], [place], [address] Phone: [phone]   Fax:  [phone]  Physical Therapy Treatment  Patient Details  Name: Susan Callahan MRN: [id number] Date of Birth: 07-11-1951 Referring Provider (PT): Francesco Sor   Encounter Date: 09/13/2018  PT End of Session - 09/13/18 1134    Visit Number  13    Number of Visits  17    Date for PT Re-Evaluation  09/19/18    Authorization Type  4/10 start 08/22/18    PT Start Time  1040    PT Stop Time  1126    PT Time Calculation (min)  46 min    Activity Tolerance  Patient tolerated treatment well    Behavior During Therapy  [facility] for tasks assessed/performed       Past Medical History:  Diagnosis Date  . COPD (chronic obstructive pulmonary disease) ([place])   . Depression 1974  . Diabetes mellitus without complication ([place])   . Hypertension   . Migraine   . 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 LUMBAR PERITONEAL SHUNT    . TYMPANOSTOMY TUBE PLACEMENT      There were no vitals filed for this visit.  Subjective Assessment - 09/13/18 1042    Subjective  Pt stated that she feels her back is bothering her on her L side. Stated she went to the dump and attempted to dispose of items with the technique the PT had taught her for item, 1 with a basketball toss. Also went to the gym and was careful about rotation.    Pertinent History  Pain began in 2007 and has gradually got worse and has progressed to nonstop. Patient states pain last all day long, everyday with no change in symptoms. Medication helps briefly. States she has difficulty with showering and standing. States she has to stand with her knee bent due to pulling sensation in legs. Pain begins immediately when stands and begins to walk. Pain feels better when she bends her knees when standing. Pain became constant and non stop around Dec 2018.  Denies bowel or bladder changes, unexplained weakness, or unexpected weight loss/gain. She currently goes to pulmonary rehab 3x a week, but has increased pain with treadmill walking. Will walk with cane on occasion around house especially at night due to occasional dizziness and vision impairments in medical history. States she also has difficulty with carrying groceries up steps due to low back pain. She would like to be able to walk better and have less pain to be less reliant on medication.     Limitations  Lifting;Standing;Walking;House hold activities    How long can you stand comfortably?  pain begins immediately     How long can you walk comfortably?  pain begins immediately     Patient Stated Goals  walk better, have less pain    Currently in Pain?  Yes    Pain Score  4     Pain Orientation  Left;Mid;Lower    Pain Descriptors / Indicators  Sore;Tightness    Pain Type  Chronic pain    Pain Onset  More than a month ago       TREATMENT:  Therapeutic exercises: performed with verbal/visual cues, CGA for seated on swiss ball activities. Goal: improve mobility, address neural tension   L/R single knee to chest stretch  3x 30s, ; Seated swiss ball forward roll out  10 x10sec holds Seated sciatic nerve glides x10 ea LE, verbal/visual cues Seated on swiss ball pelvic anterior/posterior/lateral/CW/CCW movements x15 ea direction (pain with anterior tilt, held) TrA activation on swiss ball, TrAactivation on swiss ball with RTB rows with PT  Hooklying HS stretch with DF/PF for sciatic nerve mobilityx20bilateral; prone hip flexor stretch 2x 30 seconds each LE  Manual therapy:  STM to lumbar and thoracic paraspinals, focus on L side for muscle tension, pain relief x 5 minutes      PT Education - 09/13/18 1045    Education Details  exercise technique/form, TrA activation    Person(s) Educated  Patient    Methods  Explanation;Demonstration;Tactile cues;Verbal cues    Comprehension   Verbalized understanding;Returned demonstration       PT Short Term Goals - 08/22/18 1241      PT SHORT TERM GOAL #1   Title  Patient will be independent with completion of HEP to improve ability to complete functional tasks and functional ADLs.    Baseline  10/23: will give HEP next session 11/19: HEP compliance    Time  2    Period  Weeks    Status  Achieved      PT SHORT TERM GOAL #2   Title  Patient will report pain score less than 4/10 on VAS to demonstrate reduction of pain levels with functional tasks.     Baseline  10/23: 6/10 11/19: 5/10 12/12: 9/10     Time  2    Period  Weeks    Status  Partially Met    Target Date  09/05/18        PT Long Term Goals - 08/22/18 1241      PT LONG TERM GOAL #1   Title  Patient will report pain score of <3/10 on VAS for decreased pain levels and ease with functional activities.    Baseline  10/23: 6/10 11/19: 5/10 12/12 9/10    Time  4    Period  Weeks    Status  Partially Met    Target Date  09/19/18      PT LONG TERM GOAL #2   Title  Patient will be able to ambulate on treadmill for 72mnutes without increase in back/leg pain demonstrating improved community ambulation and treadmill walking at pulmonary rehab    Baseline  10/23: 01mutes increases pain 11/19: 75m101mtes 12/12: has not had the chance to perform    Time  4    Period  Weeks    Status  Partially Met    Target Date  09/19/18      PT LONG TERM GOAL #3   Title  Patient will have decreased modified oswestry score <50% for improved ability to complete household tasks with less pain levels.     Baseline  10/23: 62% 11/19: 42%    Time  4    Period  Weeks    Status  Achieved      PT LONG TERM GOAL #4   Title  Patient will score 4+/5 on BLE MMT to demonstrate improved glute strength and ability to carry groceries with increased ease up/down stairs at home.    Baseline  10/23: 4-/5 grossly 11/19: 4/5 grossly; hamstrings 4-/5 12/12: 4/5 gross    Time  4    Period  Weeks     Status  Partially Met    Target Date  09/19/18      PT LONG TERM GOAL #5   Title  Patient will  have decreased modified oswestry score <35% for improved ability to complete household tasks with less pain levels.     Baseline  11/19: 42% 12/12: 52%    Time  4    Period  Weeks    Status  On-going    Target Date  09/19/18            Plan - 09/13/18 1133    Clinical Impression Statement  Pt reported decreased pain from 4/10 to 2/10 post session. Responded well to STM to L thoracic paraspinals. Able to progress TrA activation exercises without complaint.     Rehab Potential  Fair    Clinical Impairments Affecting Rehab Potential  (+) motivation (-) chronicity of symptoms    PT Frequency  2x / week    PT Duration  4 weeks    PT Treatment/Interventions  ADLs/Self Care Home Management;Cryotherapy;Moist Heat;Traction;Iontophoresis 13m/ml Dexamethasone;Ultrasound;Gait training;Stair training;Functional mobility training;Neuromuscular re-education;Balance training;Therapeutic exercise;Therapeutic activities;Patient/family education;Manual techniques;Energy conservation;Aquatic Therapy;Electrical Stimulation;Passive range of motion;Dry needling;Taping    PT Next Visit Plan  nerve glides, strengthening    PT Home Exercise Plan  see sheet    Consulted and Agree with Plan of Care  Patient       Patient will benefit from skilled therapeutic intervention in order to improve the following deficits and impairments:  Abnormal gait, Cardiopulmonary status limiting activity, Decreased activity tolerance, Decreased balance, Decreased endurance, Decreased range of motion, Difficulty walking, Decreased strength, Hypomobility, Decreased mobility, Impaired perceived functional ability, Impaired flexibility, Postural dysfunction, Obesity, Improper body mechanics, Pain  Visit Diagnosis: Chronic low back pain, unspecified back pain laterality, unspecified whether sciatica present  Abnormal posture  Muscle  weakness (generalized)  Other abnormalities of gait and mobility     Problem List Patient Active Problem List   Diagnosis Date Noted  . Elevated rheumatoid factor 08/13/2018  . Elevated C-reactive protein (CRP) 08/12/2018  . Elevated sed rate 08/12/2018  . Lumbar facet joint syndrome 08/12/2018  . Back pain with left-sided radiculopathy 08/12/2018  . DDD (degenerative disc disease), lumbar 08/12/2018  . Chronic lower extremity pain (Primary Area of Pain) (Bilateral) (L>R) 07/23/2018  . Chronic low back pain (Secondary Area of Pain) (Bilateral) (L>R) w/ sciatica (Bilateral) 07/23/2018  . Chronic pain syndrome 07/23/2018  . Pharmacologic therapy 07/23/2018  . Disorder of skeletal system 07/23/2018  . Problems influencing health status 07/23/2018  . Chronic [facility]iliac joint [facility] ([facility]) (Bilateral) (L>R) 07/23/2018  . Status post lumboperitoneal shunt placement 02/26/2018  . 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 Tympanic membrane perforation 05/30/2016  . PMB (postmenopausal bleeding) 05/26/2016  . Rash 12/27/2015  . 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 Plantar fasciitis of foot (Left) 05/26/2015  . Spondylosis of lumbar region without myelopathy or radiculopathy 05/26/2015  . Encounter for screening mammogram for malignant neoplasm of breast 12/02/2014  . Goiter 12/02/2014  . Chronic idiopathic gout of foot (Right) 07/06/2014  . Biceps tendonitis (Right) 05/19/2014  . Shoulder pain (Right) 05/19/2014  . Facial weakness 05/05/2014  . Post herpetic neuralgia 05/05/2014  . Chronic dysfunction of left eustachian tube 03/24/2014  . Osteopenia 03/13/2014  . Family history of colon cancer 12/02/2013  . Disequilibrium syndrome 08/26/2013  . Diastasis recti 04/08/2013  . Facial nerve paresis 07/31/2012  . Blepharospasm 05/08/2012  . Conductive hearing loss of left ear with unrestricted  hearing of right ear 04/08/2012  . Obesity 03/28/2012  . Hemoptysis 12/22/2011  . Neuropathic postherpetic  trigeminal neuralgia 12/12/2011  . Allergic rhinitis, mild 10/12/2011  . Chronic bronchitis ([place]) 06/05/2011  . Essential hypertension 05/24/2011  . Type 2 diabetes mellitus without complications ([place]) 30/74/6002  . Pulmonary nodules 05/05/2011   Lieutenant Diego PT, DPT 11:36 AM,09/13/18 [place] MAIN [facility] SERVICES [address] [place], [place], [address] Phone: [phone]   Fax:  [phone]  Name: Susan Callahan MRN: [id number] Date of Birth: 11/06/1950

## 2018-09-19 ENCOUNTER — Ambulatory Visit: Payer: Medicare Other | Admitting: Pain Medicine

## 2018-09-20 ENCOUNTER — Ambulatory Visit: Payer: Medicare Other

## 2018-09-20 DIAGNOSIS — M6281 Muscle weakness (generalized): Secondary | ICD-10-CM

## 2018-09-20 DIAGNOSIS — R293 Abnormal posture: Secondary | ICD-10-CM

## 2018-09-20 DIAGNOSIS — R2689 Other abnormalities of gait and mobility: Secondary | ICD-10-CM

## 2018-09-20 DIAGNOSIS — M545 Low back pain, unspecified: Secondary | ICD-10-CM

## 2018-09-20 DIAGNOSIS — G8929 Other chronic pain: Secondary | ICD-10-CM

## 2018-09-20 NOTE — Therapy (Signed)
[place] MAIN [facility] SERVICES [address] [place], [place], [address] Phone: [phone]   Fax:  [phone]  Physical Therapy Treatment/ RECERT  Patient Details  Name: Susan Callahan MRN: [id number] Date of Birth: Oct 13, 1950 Referring Provider (PT): Francesco Sor   Encounter Date: 09/20/2018  PT End of Session - 09/20/18 1056    Visit Number  14    Number of Visits  22    Date for PT Re-Evaluation  10/18/18    Authorization Type  5/10 start 08/22/18    PT Start Time  1044    PT Stop Time  1130    PT Time Calculation (min)  46 min    Activity Tolerance  Patient tolerated treatment well    Behavior During Therapy  [facility] for tasks assessed/performed       Past Medical History:  Diagnosis Date  . COPD (chronic obstructive pulmonary disease) ([place])   . Depression 1974  . Diabetes mellitus without complication ([place])   . Hypertension   . Migraine   . 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 LUMBAR PERITONEAL SHUNT    . TYMPANOSTOMY TUBE PLACEMENT      There were no vitals filed for this visit.  Subjective Assessment - 09/20/18 1054    Subjective  Patient reports her back has been feeling better the past few days. Was told after her EKG yesterday that she cannot have another epidural at this time. Has been compliant with HEP.     Pertinent History  Pain began in 2007 and has gradually got worse and has progressed to nonstop. Patient states pain last all day long, everyday with no change in symptoms. Medication helps briefly. States she has difficulty with showering and standing. States she has to stand with her knee bent due to pulling sensation in legs. Pain begins immediately when stands and begins to walk. Pain feels better when she bends her knees when standing. Pain became constant and non stop around Dec 2018. Denies bowel or bladder changes, unexplained weakness, or unexpected  weight loss/gain. She currently goes to pulmonary rehab 3x a week, but has increased pain with treadmill walking. Will walk with cane on occasion around house especially at night due to occasional dizziness and vision impairments in medical history. States she also has difficulty with carrying groceries up steps due to low back pain. She would like to be able to walk better and have less pain to be less reliant on medication.     Limitations  Lifting;Standing;Walking;House hold activities    How long can you stand comfortably?  pain begins immediately     How long can you walk comfortably?  pain begins immediately     Patient Stated Goals  walk better, have less pain    Currently in Pain?  Yes    Pain Score  1     Pain Location  Back    Pain Orientation  Lower;Left;Mid    Pain Descriptors / Indicators  Aching    Pain Type  Chronic pain    Pain Onset  More than a month ago    Pain Frequency  Intermittent    Aggravating Factors   standing, walking    Pain Relieving Factors  sitting, rest, medications       RECERT Goals: VAS: worst pain: 7/10, pain occurs daily  Ambulate on treadmill 10 minutes without increased pain: 5 minutes without pain MODI:  48%  MMT: 4/5 gross strength of LE's TREATMENT:  Patient is still waiting on lesions to heal prior to being able to perform aquatic therapy. Reports that land therapy has been helping. Reports her back has started feeling better in the past few days.    Therapeutic exercises: performed with verbal/visual cues, CGA for seated on swiss ball activities. Goal: improve mobility, address neural tension    L/R single knee to chest stretch  2x 30s, ; Seated swiss ball forward roll out 10 x10sec holds Seated sciatic nerve glides x10 ea LE, verbal/visual cues Seated on swiss ball pelvic TrA activation 3 minutes Hooklying HS stretch with DF/PF for sciatic nerve mobility x20 bilateral; prone hip flexor stretch 2x 30 seconds each LE  Seated TrA activation  with marching x10 each LE.   Roller to lumbar and thoracic paraspinals for muscle tension, pain relief x 5 minutes                         PT Education - 09/20/18 1056    Education Details  goals, POC, exercise technique/form    Person(s) Educated  Patient    Methods  Explanation;Demonstration;Tactile cues;Verbal cues    Comprehension  Verbalized understanding;Returned demonstration;Tactile cues required;Need further instruction;Verbal cues required       PT Short Term Goals - 09/20/18 1052      PT SHORT TERM GOAL #1   Title  Patient will be independent with completion of HEP to improve ability to complete functional tasks and functional ADLs.    Baseline  10/23: will give HEP next session 11/19: HEP compliance    Time  2    Period  Weeks    Status  Achieved      PT SHORT TERM GOAL #2   Title  Patient will report pain score less than 4/10 on VAS to demonstrate reduction of pain levels with functional tasks.     Baseline  10/23: 6/10 11/19: 5/10 12/12: 9/10 1/10: 7/10     Time  2    Period  Weeks    Status  Partially Met        PT Long Term Goals - 09/20/18 1052      PT LONG TERM GOAL #1   Title  Patient will report pain score of <3/10 on VAS for decreased pain levels and ease with functional activities.    Baseline  10/23: 6/10 11/19: 5/10 12/12 9/10 1/10: 7/10     Time  4    Period  Weeks    Status  Partially Met    Target Date  10/18/18      PT LONG TERM GOAL #2   Title  Patient will be able to ambulate on treadmill for 22mnutes without increase in back/leg pain demonstrating improved community ambulation and treadmill walking at pulmonary rehab    Baseline  10/23: 076mutes increases pain 11/19: 6m66mtes 12/12: has not had the chance to perform 1/10: was able to do 5 minutes without pain    Time  4    Period  Weeks    Status  Partially Met    Target Date  10/18/18      PT LONG TERM GOAL #3   Title  Patient will have decreased modified  oswestry score <50% for improved ability to complete household tasks with less pain levels.     Baseline  10/23: 62% 11/19: 42%    Time  4    Period  Weeks  Status  Achieved      PT LONG TERM GOAL #4   Title  Patient will score 4+/5 on BLE MMT to demonstrate improved glute strength and ability to carry groceries with increased ease up/down stairs at home.    Baseline  10/23: 4-/5 grossly 11/19: 4/5 grossly; hamstrings 4-/5 12/12: 4/5 gross 1/10: 4/5 gross     Time  4    Period  Weeks    Status  Partially Met    Target Date  10/18/18      PT LONG TERM GOAL #5   Title  Patient will have decreased modified oswestry score <35% for improved ability to complete household tasks with less pain levels.     Baseline  11/19: 42% 12/12: 52% 1/10: 48%     Time  4    Period  Weeks    Status  Partially Met    Target Date  10/18/18            Plan - 09/20/18 1118    Clinical Impression Statement  Patient has demonstrated progression towards goals with decreased worst VAS rating, improved capacity for pain free mobility, and improved quality of life. Patient continues to have pain daily limiting her prolonged mobility, ability to perform iADLs such as laundry, cleaning, etc. Patient continues to improve in core stability for postural correction with decreased need for verbal cueing. Maximum improvement is yet to be obtained. The anticipated improvement is attainable and reasonable in a generally predictable time. Patient will continue to benefit from skilled physical therapy to improve strength, reduce pain levels with ADLs, and improved quality of life.    Rehab Potential  Fair    Clinical Impairments Affecting Rehab Potential  (+) motivation (-) chronicity of symptoms    PT Frequency  2x / week    PT Duration  4 weeks    PT Treatment/Interventions  ADLs/Self Care Home Management;Cryotherapy;Moist Heat;Traction;Iontophoresis 13m/ml Dexamethasone;Ultrasound;Gait training;Stair training;Functional  mobility training;Neuromuscular re-education;Balance training;Therapeutic exercise;Therapeutic activities;Patient/family education;Manual techniques;Energy conservation;Aquatic Therapy;Electrical Stimulation;Passive range of motion;Dry needling;Taping    PT Next Visit Plan  nerve glides, strengthening    PT Home Exercise Plan  see sheet    Consulted and Agree with Plan of Care  Patient       Patient will benefit from skilled therapeutic intervention in order to improve the following deficits and impairments:  Abnormal gait, Cardiopulmonary status limiting activity, Decreased activity tolerance, Decreased balance, Decreased endurance, Decreased range of motion, Difficulty walking, Decreased strength, Hypomobility, Decreased mobility, Impaired perceived functional ability, Impaired flexibility, Postural dysfunction, Obesity, Improper body mechanics, Pain  Visit Diagnosis: Chronic low back pain, unspecified back pain laterality, unspecified whether sciatica present  Abnormal posture  Muscle weakness (generalized)  Other abnormalities of gait and mobility     Problem List Patient Active Problem List   Diagnosis Date Noted  . Elevated rheumatoid factor 08/13/2018  . Elevated C-reactive protein (CRP) 08/12/2018  . Elevated sed rate 08/12/2018  . Lumbar facet joint syndrome 08/12/2018  . Back pain with left-sided radiculopathy 08/12/2018  . DDD (degenerative disc disease), lumbar 08/12/2018  . Chronic lower extremity pain (Primary Area of Pain) (Bilateral) (L>R) 07/23/2018  . Chronic low back pain (Secondary Area of Pain) (Bilateral) (L>R) w/ sciatica (Bilateral) 07/23/2018  . Chronic pain syndrome 07/23/2018  . Pharmacologic therapy 07/23/2018  . Disorder of skeletal system 07/23/2018  . Problems influencing health status 07/23/2018  . [facility] ([facility]) (L>R) 07/23/2018  . Status post lumboperitoneal  shunt placement 02/26/2018  .  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 Tympanic membrane perforation 05/30/2016  . PMB (postmenopausal bleeding) 05/26/2016  . Rash 12/27/2015  . 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 Plantar fasciitis of foot (Left) 05/26/2015  . Spondylosis of lumbar region without myelopathy or radiculopathy 05/26/2015  . Encounter for screening mammogram for malignant neoplasm of breast 12/02/2014  . Goiter 12/02/2014  . Chronic idiopathic gout of foot (Right) 07/06/2014  . Biceps tendonitis (Right) 05/19/2014  . Shoulder pain (Right) 05/19/2014  . Facial weakness 05/05/2014  . Post herpetic neuralgia 05/05/2014  . Chronic dysfunction of left eustachian tube 03/24/2014  . Osteopenia 03/13/2014  . Family history of colon cancer 12/02/2013  . Disequilibrium syndrome 08/26/2013  . Diastasis recti 04/08/2013  . Facial nerve paresis 07/31/2012  . Blepharospasm 05/08/2012  . 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 Obesity 03/28/2012  . Hemoptysis 12/22/2011  . Neuropathic postherpetic trigeminal neuralgia 12/12/2011  . Allergic rhinitis, mild 10/12/2011  . Chronic bronchitis ([place]) 06/05/2011  . Essential hypertension 05/24/2011  . Type 2 diabetes mellitus without complications ([place]) 75/19/8242  . Pulmonary nodules 05/05/2011   Janna Arch, PT, DPT   09/20/2018, 11:34 AM  [place] MAIN [facility] SERVICES [address] [place], [place], [address] Phone: [phone]   Fax:  [phone]  Name: Susan Callahan MRN: [id number] Date of Birth: 15-Apr-1951

## 2018-09-26 ENCOUNTER — Ambulatory Visit: Payer: Medicare Other

## 2018-09-26 DIAGNOSIS — R2689 Other abnormalities of gait and mobility: Secondary | ICD-10-CM

## 2018-09-26 DIAGNOSIS — M545 Low back pain, unspecified: Secondary | ICD-10-CM

## 2018-09-26 DIAGNOSIS — M6281 Muscle weakness (generalized): Secondary | ICD-10-CM

## 2018-09-26 DIAGNOSIS — G8929 Other chronic pain: Secondary | ICD-10-CM

## 2018-09-26 DIAGNOSIS — R293 Abnormal posture: Secondary | ICD-10-CM

## 2018-09-26 NOTE — Therapy (Signed)
[place] MAIN [facility] SERVICES [address] [place], [place], [address] Phone: [phone]   Fax:  [phone]  Physical Therapy Treatment  Patient Details  Name: Susan Callahan MRN: [id number] Date of Birth: 12-06-1950 Referring Provider (PT): Francesco Sor   Encounter Date: 09/26/2018  PT End of Session - 09/26/18 1512    Visit Number  15    Number of Visits  22    Date for PT Re-Evaluation  10/18/18    Authorization Type  6/10 start 08/22/18    PT Start Time  1451    PT Stop Time  1533    PT Time Calculation (min)  42 min    Activity Tolerance  Patient tolerated treatment well    Behavior During Therapy  [facility] for tasks assessed/performed       Past Medical History:  Diagnosis Date  . COPD (chronic obstructive pulmonary disease) ([place])   . Depression 1974  . Diabetes mellitus without complication ([place])   . Hypertension   . Migraine   . 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 LUMBAR PERITONEAL SHUNT    . TYMPANOSTOMY TUBE PLACEMENT      There were no vitals filed for this visit.  Subjective Assessment - 09/26/18 1452    Subjective  Patient had three large bags of soil that she got yesterday at [facility] and had to put them into the cart and from the cart to the car and the car to the wheelbarrow. Is having increased pain today, went to exercise class prior to this session for a little over an hour.     Pertinent History  Pain began in 2007 and has gradually got worse and has progressed to nonstop. Patient states pain last all day long, everyday with no change in symptoms. Medication helps briefly. States she has difficulty with showering and standing. States she has to stand with her knee bent due to pulling sensation in legs. Pain begins immediately when stands and begins to walk. Pain feels better when she bends her knees when standing. Pain became constant and non stop around  Dec 2018. Denies bowel or bladder changes, unexplained weakness, or unexpected weight loss/gain. She currently goes to pulmonary rehab 3x a week, but has increased pain with treadmill walking. Will walk with cane on occasion around house especially at night due to occasional dizziness and vision impairments in medical history. States she also has difficulty with carrying groceries up steps due to low back pain. She would like to be able to walk better and have less pain to be less reliant on medication.     Limitations  Lifting;Standing;Walking;House hold activities    How long can you stand comfortably?  pain begins immediately     How long can you walk comfortably?  pain begins immediately     Patient Stated Goals  walk better, have less pain    Currently in Pain?  Yes    Pain Score  7     Pain Location  Back    Pain Orientation  Left;Lower;Mid    Pain Descriptors / Indicators  Aching;Stabbing    Pain Type  Chronic pain    Pain Radiating Towards  bilateral LE's    Pain Onset  More than a month ago    Pain Frequency  Intermittent        TREATMENT:   Therapeutic exercises: performed with verbal/visual cues, CGA for seated  on swiss ball activities. Goal: improve mobility, address neural tension    L/R single knee to chest stretch  3x 30s, ; Seated swiss ball forward roll out 10 x10sec holds Seated sciatic nerve glides x10 ea LE, verbal/visual cues TrA activation supine 10x 5 second holds Hooklying HS stretch with DF/PF for sciatic nerve mobility x20 bilateral; Posterior pelvic tilt 10x 5 second holds    Manual therapy:  STM to lumbar and thoracic paraspinals, focus on L side for muscle tension, pain relief x 5 minutes Hamstring stretch 2x60 seconds each LE prone hip flexor stretch 2x 30 seconds each LE  TENS cross pattern,on lumbar spine 120 Hz 50 Micos level 4; education on placement of pads, physics of TENS unit.   Pain reduced to 2/10 at end of session.                      PT Education - 09/26/18 1512    Education Details  exercise technique/form, TENS unit,     Person(s) Educated  Patient    Methods  Explanation;Demonstration;Tactile cues;Verbal cues    Comprehension  Verbalized understanding;Returned demonstration;Verbal cues required;Tactile cues required;Need further instruction       PT Short Term Goals - 09/20/18 1052      PT SHORT TERM GOAL #1   Title  Patient will be independent with completion of HEP to improve ability to complete functional tasks and functional ADLs.    Baseline  10/23: will give HEP next session 11/19: HEP compliance    Time  2    Period  Weeks    Status  Achieved      PT SHORT TERM GOAL #2   Title  Patient will report pain score less than 4/10 on VAS to demonstrate reduction of pain levels with functional tasks.     Baseline  10/23: 6/10 11/19: 5/10 12/12: 9/10 1/10: 7/10     Time  2    Period  Weeks    Status  Partially Met        PT Long Term Goals - 09/20/18 1052      PT LONG TERM GOAL #1   Title  Patient will report pain score of <3/10 on VAS for decreased pain levels and ease with functional activities.    Baseline  10/23: 6/10 11/19: 5/10 12/12 9/10 1/10: 7/10     Time  4    Period  Weeks    Status  Partially Met    Target Date  10/18/18      PT LONG TERM GOAL #2   Title  Patient will be able to ambulate on treadmill for 75mnutes without increase in back/leg pain demonstrating improved community ambulation and treadmill walking at pulmonary rehab    Baseline  10/23: 049mutes increases pain 11/19: 43m66mtes 12/12: has not had the chance to perform 1/10: was able to do 5 minutes without pain    Time  4    Period  Weeks    Status  Partially Met    Target Date  10/18/18      PT LONG TERM GOAL #3   Title  Patient will have decreased modified oswestry score <50% for improved ability to complete household tasks with less pain levels.     Baseline  10/23: 62% 11/19: 42%     Time  4    Period  Weeks    Status  Achieved      PT LONG TERM GOAL #4   Title  Patient will score 4+/5 on BLE MMT to demonstrate improved glute strength and ability to carry groceries with increased ease up/down stairs at home.    Baseline  10/23: 4-/5 grossly 11/19: 4/5 grossly; hamstrings 4-/5 12/12: 4/5 gross 1/10: 4/5 gross     Time  4    Period  Weeks    Status  Partially Met    Target Date  10/18/18      PT LONG TERM GOAL #5   Title  Patient will have decreased modified oswestry score <35% for improved ability to complete household tasks with less pain levels.     Baseline  11/19: 42% 12/12: 52% 1/10: 48%     Time  4    Period  Weeks    Status  Partially Met    Target Date  10/18/18            Plan - 09/26/18 1517    Clinical Impression Statement  Patient presents with increased pain to today's session with pain reduced through manual and therEx.  Due to patient arriving from exercise class, lower extremity strengthening was deffered for this session. Patient introduced to TENS unit and educated on how the unit works and where to place it. Patient will continue to benefit from skilled physical therapy to improve strength, reduce pain levels with ADLs, and improved quality of life.     Rehab Potential  Fair    Clinical Impairments Affecting Rehab Potential  (+) motivation (-) chronicity of symptoms    PT Frequency  2x / week    PT Duration  4 weeks    PT Treatment/Interventions  ADLs/Self Care Home Management;Cryotherapy;Moist Heat;Traction;Iontophoresis 72m/ml Dexamethasone;Ultrasound;Gait training;Stair training;Functional mobility training;Neuromuscular re-education;Balance training;Therapeutic exercise;Therapeutic activities;Patient/family education;Manual techniques;Energy conservation;Aquatic Therapy;Electrical Stimulation;Passive range of motion;Dry needling;Taping    PT Next Visit Plan  nerve glides, strengthening    PT Home Exercise Plan  see sheet    Consulted  and Agree with Plan of Care  Patient       Patient will benefit from skilled therapeutic intervention in order to improve the following deficits and impairments:  Abnormal gait, Cardiopulmonary status limiting activity, Decreased activity tolerance, Decreased balance, Decreased endurance, Decreased range of motion, Difficulty walking, Decreased strength, Hypomobility, Decreased mobility, Impaired perceived functional ability, Impaired flexibility, Postural dysfunction, Obesity, Improper body mechanics, Pain  Visit Diagnosis: Chronic low back pain, unspecified back pain laterality, unspecified whether sciatica present  Abnormal posture  Muscle weakness (generalized)  Other abnormalities of gait and mobility     Problem List Patient Active Problem List   Diagnosis Date Noted  . Elevated rheumatoid factor 08/13/2018  . Elevated C-reactive protein (CRP) 08/12/2018  . Elevated sed rate 08/12/2018  . Lumbar facet joint syndrome 08/12/2018  . Back pain with left-sided radiculopathy 08/12/2018  . DDD (degenerative disc disease), lumbar 08/12/2018  . Chronic lower extremity pain (Primary Area of Pain) (Bilateral) (L>R) 07/23/2018  . Chronic low back pain (Secondary Area of Pain) (Bilateral) (L>R) w/ sciatica (Bilateral) 07/23/2018  . Chronic pain syndrome 07/23/2018  . Pharmacologic therapy 07/23/2018  . Disorder of skeletal system 07/23/2018  . Problems influencing health status 07/23/2018  . Chronic sacroiliac joint pain [facility]) (Bilateral) (L>R) 07/23/2018  . Status post lumboperitoneal shunt placement 02/26/2018  . 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 Tympanic membrane perforation 05/30/2016  . PMB (postmenopausal bleeding) 05/26/2016  . Rash 12/27/2015  . 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  Plantar fasciitis of foot (Left) 05/26/2015  . Spondylosis of lumbar region without myelopathy or  radiculopathy 05/26/2015  . Encounter for screening mammogram for malignant neoplasm of breast 12/02/2014  . Goiter 12/02/2014  . Chronic idiopathic gout of foot (Right) 07/06/2014  . Biceps tendonitis (Right) 05/19/2014  . Shoulder pain (Right) 05/19/2014  . Facial weakness 05/05/2014  . Post herpetic neuralgia 05/05/2014  . Chronic dysfunction of left eustachian tube 03/24/2014  . Osteopenia 03/13/2014  . Family history of colon cancer 12/02/2013  . Disequilibrium syndrome 08/26/2013  . Diastasis recti 04/08/2013  . Facial nerve paresis 07/31/2012  . Blepharospasm 05/08/2012  . 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 Obesity 03/28/2012  . Hemoptysis 12/22/2011  . Neuropathic postherpetic trigeminal neuralgia 12/12/2011  . Allergic rhinitis, mild 10/12/2011  . Chronic bronchitis ([place]) 06/05/2011  . Essential hypertension 05/24/2011  . Type 2 diabetes mellitus without complications ([place]) 16/06/9603  . Pulmonary nodules 05/05/2011   Janna Arch, PT, DPT   09/26/2018, 3:36 PM  [place] MAIN [facility] SERVICES [address] [place], [place], [address] Phone: [phone]   Fax:  [phone]  Name: Susan Callahan MRN: [id number] Date of Birth: 1951-04-22

## 2018-10-01 ENCOUNTER — Ambulatory Visit: Payer: Medicare Other

## 2018-10-01 DIAGNOSIS — M545 Low back pain: Secondary | ICD-10-CM | POA: Diagnosis not present

## 2018-10-01 DIAGNOSIS — M6281 Muscle weakness (generalized): Secondary | ICD-10-CM

## 2018-10-01 DIAGNOSIS — G8929 Other chronic pain: Secondary | ICD-10-CM

## 2018-10-01 DIAGNOSIS — R293 Abnormal posture: Secondary | ICD-10-CM

## 2018-10-01 DIAGNOSIS — R2689 Other abnormalities of gait and mobility: Secondary | ICD-10-CM

## 2018-10-01 NOTE — Therapy (Signed)
[place] MAIN [facility] SERVICES [address] [place], [place], [address] Phone: [phone]   Fax:  [phone]  Physical Therapy Treatment  Patient Details  Name: Susan Callahan MRN: [id number] Date of Birth: [birth year] Referring Provider (PT): Francesco Sor   Encounter Date: 10/01/2018  PT End of Session - 10/01/18 0818    Visit Number  16    Number of Visits  22    Date for PT Re-Evaluation  10/18/18    Authorization Type  7/10 start 08/22/18    PT Start Time  0800    PT Stop Time  0844    PT Time Calculation (min)  44 min    Activity Tolerance  Patient tolerated treatment well    Behavior During Therapy  [facility] for tasks assessed/performed       Past Medical History:  Diagnosis Date  . COPD (chronic obstructive pulmonary disease) ([place])   . Depression 1974  . Diabetes mellitus without complication ([place])   . Hypertension   . Migraine   . 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 LUMBAR PERITONEAL SHUNT    . TYMPANOSTOMY TUBE PLACEMENT      There were no vitals filed for this visit.  Subjective Assessment - 10/01/18 0804    Subjective  Patient reports that she is having a backslide on her back that began on Saturday. Feeling more of it in her lower back as well as in the legs. Has been doing exercises. was concious when lifting kitty litter.     Pertinent History  Pain began in 2007 and has gradually got worse and has progressed to nonstop. Patient states pain last all day long, everyday with no change in symptoms. Medication helps briefly. States she has difficulty with showering and standing. States she has to stand with her knee bent due to pulling sensation in legs. Pain begins immediately when stands and begins to walk. Pain feels better when she bends her knees when standing. Pain became constant and non stop around Dec 2018. Denies bowel or bladder changes, unexplained  weakness, or unexpected weight loss/gain. She currently goes to pulmonary rehab 3x a week, but has increased pain with treadmill walking. Will walk with cane on occasion around house especially at night due to occasional dizziness and vision impairments in medical history. States she also has difficulty with carrying groceries up steps due to low back pain. She would like to be able to walk better and have less pain to be less reliant on medication.     Limitations  Lifting;Standing;Walking;House hold activities    How long can you stand comfortably?  pain begins immediately     How long can you walk comfortably?  pain begins immediately     Patient Stated Goals  walk better, have less pain    Currently in Pain?  Yes    Pain Score  6     Pain Location  Back    Pain Orientation  Lower    Pain Descriptors / Indicators  Aching;Stabbing    Pain Type  Chronic pain    Pain Onset  More than a month ago    Pain Frequency  Intermittent       TREATMENT:   Therapeutic exercises: performed with verbal/visual cues, CGA for seated on swiss ball activities. Goal: improve mobility, address neural tension    L/R single knee to chest stretch  3x 30s, ; Seated  swiss ball forward roll out 10 x10sec holds Seated sciatic nerve glides x10 ea LE, verbal/visual cues TrA activation supine 10x 5 second holds TrA activation with march, cues for keeping core stabilized throughout entire leg lift and eccentric drop, reset between legs. 10x each LE Hooklying HS stretch with DF/PF for sciatic nerve mobility x20 bilateral; Posterior pelvic tilt 10x 5 second holds  Prone: knee flexed hip extension: donkey kicks 10x with PT assisting with proper alignment of LE's.: terminated due to R hamstring spasm.   Manual therapy:  STM to lumbar and thoracic paraspinals, focus on L side for muscle tension, pain relief x 5 minutes Hamstring stretch 2x60 seconds each LE prone hip flexor stretch 2x 30 seconds each LE   TENS cross  pattern,on lumbar spine 120 Hz 50 Micos level 5; education on placement of pads, physics of TENS unit.   Pain reduced to 2/10 at end of session.                          PT Education - 10/01/18 0817    Education Details  exercise technique, form TENS  unit    Person(s) Educated  Patient    Methods  Explanation;Demonstration;Tactile cues;Verbal cues    Comprehension  Verbalized understanding;Returned demonstration;Tactile cues required;Need further instruction;Verbal cues required       PT Short Term Goals - 09/20/18 1052      PT SHORT TERM GOAL #1   Title  Patient will be independent with completion of HEP to improve ability to complete functional tasks and functional ADLs.    Baseline  10/23: will give HEP next session 11/19: HEP compliance    Time  2    Period  Weeks    Status  Achieved      PT SHORT TERM GOAL #2   Title  Patient will report pain score less than 4/10 on VAS to demonstrate reduction of pain levels with functional tasks.     Baseline  10/23: 6/10 11/19: 5/10 12/12: 9/10 1/10: 7/10     Time  2    Period  Weeks    Status  Partially Met        PT Long Term Goals - 09/20/18 1052      PT LONG TERM GOAL #1   Title  Patient will report pain score of <3/10 on VAS for decreased pain levels and ease with functional activities.    Baseline  10/23: 6/10 11/19: 5/10 12/12 9/10 1/10: 7/10     Time  4    Period  Weeks    Status  Partially Met    Target Date  10/18/18      PT LONG TERM GOAL #2   Title  Patient will be able to ambulate on treadmill for 17mnutes without increase in back/leg pain demonstrating improved community ambulation and treadmill walking at pulmonary rehab    Baseline  10/23: 060mutes increases pain 11/19: 37m41mtes 12/12: has not had the chance to perform 1/10: was able to do 5 minutes without pain    Time  4    Period  Weeks    Status  Partially Met    Target Date  10/18/18      PT LONG TERM GOAL #3   Title  Patient will  have decreased modified oswestry score <50% for improved ability to complete household tasks with less pain levels.     Baseline  10/23: 62% 11/19: 42%    Time  4    Period  Weeks    Status  Achieved      PT LONG TERM GOAL #4   Title  Patient will score 4+/5 on BLE MMT to demonstrate improved glute strength and ability to carry groceries with increased ease up/down stairs at home.    Baseline  10/23: 4-/5 grossly 11/19: 4/5 grossly; hamstrings 4-/5 12/12: 4/5 gross 1/10: 4/5 gross     Time  4    Period  Weeks    Status  Partially Met    Target Date  10/18/18      PT LONG TERM GOAL #5   Title  Patient will have decreased modified oswestry score <35% for improved ability to complete household tasks with less pain levels.     Baseline  11/19: 42% 12/12: 52% 1/10: 48%     Time  4    Period  Weeks    Status  Partially Met    Target Date  10/18/18            Plan - 10/01/18 1552    Clinical Impression Statement  Patient continues to have progress with pain control, even bad days are better than before per her report. Patient continues to benefit from TENS unit being on while performing interventions during session. Pain reduced to 2/10 by end of session. Patient will continue to benefit from skilled physical therapy to improve strength, reduce pain levels with ADLs, and improved quality of life.     Rehab Potential  Fair    Clinical Impairments Affecting Rehab Potential  (+) motivation (-) chronicity of symptoms    PT Frequency  2x / week    PT Duration  4 weeks    PT Treatment/Interventions  ADLs/Self Care Home Management;Cryotherapy;Moist Heat;Traction;Iontophoresis 39m/ml Dexamethasone;Ultrasound;Gait training;Stair training;Functional mobility training;Neuromuscular re-education;Balance training;Therapeutic exercise;Therapeutic activities;Patient/family education;Manual techniques;Energy conservation;Aquatic Therapy;Electrical Stimulation;Passive range of motion;Dry needling;Taping     PT Next Visit Plan  nerve glides, strengthening    PT Home Exercise Plan  see sheet    Consulted and Agree with Plan of Care  Patient       Patient will benefit from skilled therapeutic intervention in order to improve the following deficits and impairments:  Abnormal gait, Cardiopulmonary status limiting activity, Decreased activity tolerance, Decreased balance, Decreased endurance, Decreased range of motion, Difficulty walking, Decreased strength, Hypomobility, Decreased mobility, Impaired perceived functional ability, Impaired flexibility, Postural dysfunction, Obesity, Improper body mechanics, Pain  Visit Diagnosis: Chronic low back pain, unspecified back pain laterality, unspecified whether sciatica present  Abnormal posture  Muscle weakness (generalized)  Other abnormalities of gait and mobility     Problem List Patient Active Problem List   Diagnosis Date Noted  . Elevated rheumatoid factor 08/13/2018  . Elevated C-reactive protein (CRP) 08/12/2018  . Elevated sed rate 08/12/2018  . Lumbar facet joint syndrome 08/12/2018  . Back pain with left-sided radiculopathy 08/12/2018  . DDD (degenerative disc disease), lumbar 08/12/2018  . Chronic lower extremity pain (Primary Area of Pain) (Bilateral) (L>R) 07/23/2018  . Chronic low back pain (Secondary Area of Pain) (Bilateral) (L>R) w/ sciatica (Bilateral) 07/23/2018  . Chronic pain syndrome 07/23/2018  . Pharmacologic therapy 07/23/2018  . Disorder of skeletal system 07/23/2018  . Problems influencing health status 07/23/2018  . Chronic sacroiliac joint pain ([facility]) (Bilateral) (L>R) 07/23/2018  . Status post lumboperitoneal shunt placement 02/26/2018  . 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 Tympanic membrane perforation 05/30/2016  . PMB (postmenopausal bleeding)  05/26/2016  . Rash 12/27/2015  . 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 Plantar fasciitis of foot (Left) 05/26/2015  . Spondylosis of lumbar region without myelopathy or radiculopathy 05/26/2015  . Encounter for screening mammogram for malignant neoplasm of breast 12/02/2014  . Goiter 12/02/2014  . Chronic idiopathic gout of foot (Right) 07/06/2014  . Biceps tendonitis (Right) 05/19/2014  . Shoulder pain (Right) 05/19/2014  . Facial weakness 05/05/2014  . Post herpetic neuralgia 05/05/2014  . Chronic dysfunction of left eustachian tube 03/24/2014  . Osteopenia 03/13/2014  . Family history of colon cancer 12/02/2013  . Disequilibrium syndrome 08/26/2013  . Diastasis recti 04/08/2013  . Facial nerve paresis 07/31/2012  . Blepharospasm 05/08/2012  . 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 Obesity 03/28/2012  . Hemoptysis 12/22/2011  . Neuropathic postherpetic trigeminal neuralgia 12/12/2011  . Allergic rhinitis, mild 10/12/2011  . Chronic bronchitis ([place]) 06/05/2011  . Essential hypertension 05/24/2011  . Type 2 diabetes mellitus without complications ([place]) 55/82/8332  . Pulmonary nodules 05/05/2011   Janna Arch, PT, DPT   10/01/2018, 8:44 AM  [place] MAIN [facility] SERVICES [address] [place], [place], [address] Phone: [phone]   Fax:  [phone]  Name: Myana Schlup MRN: [id number] Date of Birth: 1951-04-07

## 2018-10-02 ENCOUNTER — Ambulatory Visit: Payer: Medicare Other

## 2018-10-04 ENCOUNTER — Ambulatory Visit: Payer: Medicare Other

## 2018-10-04 DIAGNOSIS — M545 Low back pain, unspecified: Secondary | ICD-10-CM

## 2018-10-04 DIAGNOSIS — M6281 Muscle weakness (generalized): Secondary | ICD-10-CM

## 2018-10-04 DIAGNOSIS — R293 Abnormal posture: Secondary | ICD-10-CM

## 2018-10-04 DIAGNOSIS — G8929 Other chronic pain: Secondary | ICD-10-CM

## 2018-10-04 DIAGNOSIS — R2689 Other abnormalities of gait and mobility: Secondary | ICD-10-CM

## 2018-10-04 NOTE — Therapy (Signed)
[place] MAIN [facility] SERVICES [address] [place], [place], [address] Phone: [phone]   Fax:  [phone]  Physical Therapy Treatment  Patient Details  Name: Susan Callahan MRN: [id number] Date of Birth: 05-29-1951 Referring Provider (PT): Francesco Sor   Encounter Date: 10/04/2018  PT End of Session - 10/04/18 0900    Visit Number  17    Number of Visits  22    Date for PT Re-Evaluation  10/18/18    Authorization Type  8/10 start 08/22/18    PT Start Time  0847    PT Stop Time  0930    PT Time Calculation (min)  43 min    Activity Tolerance  Patient tolerated treatment well    Behavior During Therapy  [facility] for tasks assessed/performed       Past Medical History:  Diagnosis Date  . COPD (chronic obstructive pulmonary disease) ([place])   . Depression 1974  . Diabetes mellitus without complication ([place])   . Hypertension   . Migraine   . 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 LUMBAR PERITONEAL SHUNT    . TYMPANOSTOMY TUBE PLACEMENT      There were no vitals filed for this visit.  Subjective Assessment - 10/04/18 0850    Subjective  [place] dump [place] and is having some pain from trying a new way of throwing the litter in (shooting hoops style). Went to the doctor and is going next Thursday potentially to get an epidural. Per patient report the doctor thinks that surgery can continue to be held off.     Pertinent History  Pain began in 2007 and has gradually got worse and has progressed to nonstop. Patient states pain last all day long, everyday with no change in symptoms. Medication helps briefly. States she has difficulty with showering and standing. States she has to stand with her knee bent due to pulling sensation in legs. Pain begins immediately when stands and begins to walk. Pain feels better when she bends her knees when standing. Pain became constant  and non stop around Dec 2018. Denies bowel or bladder changes, unexplained weakness, or unexpected weight loss/gain. She currently goes to pulmonary rehab 3x a week, but has increased pain with treadmill walking. Will walk with cane on occasion around house especially at night due to occasional dizziness and vision impairments in medical history. States she also has difficulty with carrying groceries up steps due to low back pain. She would like to be able to walk better and have less pain to be less reliant on medication.     Limitations  Lifting;Standing;Walking;House hold activities    How long can you stand comfortably?  pain begins immediately     How long can you walk comfortably?  pain begins immediately     Patient Stated Goals  walk better, have less pain    Currently in Pain?  Yes    Pain Score  5     Pain Location  Back    Pain Orientation  Lower    Pain Descriptors / Indicators  Aching;Sore    Pain Type  Chronic pain    Pain Onset  More than a month ago    Pain Frequency  Intermittent       TREATMENT:   Therapeutic exercises: performed with verbal/visual cues, CGA for seated on swiss ball activities. Goal: improve mobility, address neural tension  L/R single knee to chest stretch  3x 30s, ; Seated swiss ball forward roll out 10 x10sec holds Seated sciatic nerve glides x10 ea LE, verbal/visual cues TrA activation with march, cues for keeping core stabilized throughout entire leg lift and eccentric drop, reset between legs. 10x each LE Hooklying HS stretch with DF/PF for sciatic nerve mobility x20 bilateral; Posterior pelvic tilt 10x 5 second holds  Bridge: attempted: caused spasm of L hamstring; terminated Gluteal squeezes in supine position 10x 5 seconds.  Seated adductor squeeze 10x 3 second holds. Hands on knees o    Manual therapy:  STM to lumbar and thoracic paraspinals, focus on L side for muscle tension, pain relief x 3 minutes Hamstring stretch 2x60 seconds each  LE prone hip flexor stretch 2x 30 seconds each LE Gentle Grade I-II mobilizations to lumbar CPA and UPAs for pain control, relieved pain R L3-4. 10 seconds each level x2   TENS cross pattern on lumbar spine 120 Hz 50 Micos level 5; education on placement of pads, physics of TENS unit.    Pain reduced to 2/10 at end of session.                        PT Education - 10/04/18 0859    Education Details  exercise technique, TENS, manual     Person(s) Educated  Patient    Methods  Explanation;Demonstration;Tactile cues;Verbal cues    Comprehension  Verbalized understanding;Returned demonstration;Verbal cues required;Tactile cues required;Need further instruction       PT Short Term Goals - 09/20/18 1052      PT SHORT TERM GOAL #1   Title  Patient will be independent with completion of HEP to improve ability to complete functional tasks and functional ADLs.    Baseline  10/23: will give HEP next session 11/19: HEP compliance    Time  2    Period  Weeks    Status  Achieved      PT SHORT TERM GOAL #2   Title  Patient will report pain score less than 4/10 on VAS to demonstrate reduction of pain levels with functional tasks.     Baseline  10/23: 6/10 11/19: 5/10 12/12: 9/10 1/10: 7/10     Time  2    Period  Weeks    Status  Partially Met        PT Long Term Goals - 09/20/18 1052      PT LONG TERM GOAL #1   Title  Patient will report pain score of <3/10 on VAS for decreased pain levels and ease with functional activities.    Baseline  10/23: 6/10 11/19: 5/10 12/12 9/10 1/10: 7/10     Time  4    Period  Weeks    Status  Partially Met    Target Date  10/18/18      PT LONG TERM GOAL #2   Title  Patient will be able to ambulate on treadmill for 71mnutes without increase in back/leg pain demonstrating improved community ambulation and treadmill walking at pulmonary rehab    Baseline  10/23: 067mutes increases pain 11/19: 57m22mtes 12/12: has not had the chance to  perform 1/10: was able to do 5 minutes without pain    Time  4    Period  Weeks    Status  Partially Met    Target Date  10/18/18      PT LONG TERM GOAL #3   Title  Patient will  have decreased modified oswestry score <50% for improved ability to complete household tasks with less pain levels.     Baseline  10/23: 62% 11/19: 42%    Time  4    Period  Weeks    Status  Achieved      PT LONG TERM GOAL #4   Title  Patient will score 4+/5 on BLE MMT to demonstrate improved glute strength and ability to carry groceries with increased ease up/down stairs at home.    Baseline  10/23: 4-/5 grossly 11/19: 4/5 grossly; hamstrings 4-/5 12/12: 4/5 gross 1/10: 4/5 gross     Time  4    Period  Weeks    Status  Partially Met    Target Date  10/18/18      PT LONG TERM GOAL #5   Title  Patient will have decreased modified oswestry score <35% for improved ability to complete household tasks with less pain levels.     Baseline  11/19: 42% 12/12: 52% 1/10: 48%     Time  4    Period  Weeks    Status  Partially Met    Target Date  10/18/18            Plan - 10/04/18 1117    Clinical Impression Statement  Patient presents with more soreness and pain from throwing kitty litter [place] the dump. Went to physician and reports physician is pleased with progress and wants to hold off on surgery, will have an epidural next week. Patient is able to control muscle tightness and pain. Patient will continue to benefit from skilled physical therapy to improve strength, reduce pain levels with ADLs, and improved quality of life    Rehab Potential  Fair    Clinical Impairments Affecting Rehab Potential  (+) motivation (-) chronicity of symptoms    PT Frequency  2x / week    PT Duration  4 weeks    PT Treatment/Interventions  ADLs/Self Care Home Management;Cryotherapy;Moist Heat;Traction;Iontophoresis 17m/ml Dexamethasone;Ultrasound;Gait training;Stair training;Functional mobility training;Neuromuscular  re-education;Balance training;Therapeutic exercise;Therapeutic activities;Patient/family education;Manual techniques;Energy conservation;Aquatic Therapy;Electrical Stimulation;Passive range of motion;Dry needling;Taping    PT Next Visit Plan  nerve glides, strengthening    PT Home Exercise Plan  see sheet    Consulted and Agree with Plan of Care  Patient       Patient will benefit from skilled therapeutic intervention in order to improve the following deficits and impairments:  Abnormal gait, Cardiopulmonary status limiting activity, Decreased activity tolerance, Decreased balance, Decreased endurance, Decreased range of motion, Difficulty walking, Decreased strength, Hypomobility, Decreased mobility, Impaired perceived functional ability, Impaired flexibility, Postural dysfunction, Obesity, Improper body mechanics, Pain  Visit Diagnosis: Chronic low back pain, unspecified back pain laterality, unspecified whether sciatica present  Abnormal posture  Muscle weakness (generalized)  Other abnormalities of gait and mobility     Problem List Patient Active Problem List   Diagnosis Date Noted  . Elevated rheumatoid factor 08/13/2018  . Elevated C-reactive protein (CRP) 08/12/2018  . Elevated sed rate 08/12/2018  . Lumbar facet joint syndrome 08/12/2018  . Back pain with left-sided radiculopathy 08/12/2018  . DDD (degenerative disc disease), lumbar 08/12/2018  . Chronic lower extremity pain (Primary Area of Pain) (Bilateral) (L>R) 07/23/2018  . Chronic low back pain (Secondary Area of Pain) (Bilateral) (L>R) w/ sciatica (Bilateral) 07/23/2018  . Chronic pain syndrome 07/23/2018  . Pharmacologic therapy 07/23/2018  . Disorder of skeletal system 07/23/2018  . Problems influencing health status 07/23/2018  . Chronic sacroiliac joint pain (  Tertiary Area of Pain) (Bilateral) (L>R) 07/23/2018  . Status post lumboperitoneal shunt placement 02/26/2018  . Osteoarthritis of first carpometacarpal  joint of hand (Left) 05/04/2017  . Tympanic membrane perforation 05/30/2016  . PMB (postmenopausal bleeding) 05/26/2016  . Rash 12/27/2015  . 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 Plantar fasciitis of foot (Left) 05/26/2015  . Spondylosis of lumbar region without myelopathy or radiculopathy 05/26/2015  . Encounter for screening mammogram for malignant neoplasm of breast 12/02/2014  . Goiter 12/02/2014  . Chronic idiopathic gout of foot (Right) 07/06/2014  . Biceps tendonitis (Right) 05/19/2014  . Shoulder pain (Right) 05/19/2014  . Facial weakness 05/05/2014  . Post herpetic neuralgia 05/05/2014  . Chronic dysfunction of left eustachian tube 03/24/2014  . Osteopenia 03/13/2014  . Family history of colon cancer 12/02/2013  . Disequilibrium syndrome 08/26/2013  . Diastasis recti 04/08/2013  . Facial nerve paresis 07/31/2012  . Blepharospasm 05/08/2012  . 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 Obesity 03/28/2012  . Hemoptysis 12/22/2011  . Neuropathic postherpetic trigeminal neuralgia 12/12/2011  . Allergic rhinitis, mild 10/12/2011  . Chronic bronchitis ([place]) 06/05/2011  . Essential hypertension 05/24/2011  . Type 2 diabetes mellitus without complications ([place]) 20/91/9802  . Pulmonary nodules 05/05/2011   Janna Arch, PT, DPT   10/04/2018, 9:32 AM  [place] MAIN [facility] SERVICES [address] [place], [place], [address] Phone: [phone]   Fax:  [phone]  Name: Susan Callahan MRN: [id number] Date of Birth: 10-28-1950

## 2018-10-08 ENCOUNTER — Ambulatory Visit: Payer: Medicare Other | Admitting: Physical Therapy

## 2018-10-08 ENCOUNTER — Encounter: Payer: Self-pay | Admitting: Physical Therapy

## 2018-10-08 DIAGNOSIS — R293 Abnormal posture: Secondary | ICD-10-CM

## 2018-10-08 DIAGNOSIS — M6281 Muscle weakness (generalized): Secondary | ICD-10-CM

## 2018-10-08 DIAGNOSIS — R2689 Other abnormalities of gait and mobility: Secondary | ICD-10-CM

## 2018-10-08 DIAGNOSIS — M545 Low back pain, unspecified: Secondary | ICD-10-CM

## 2018-10-08 DIAGNOSIS — G8929 Other chronic pain: Secondary | ICD-10-CM

## 2018-10-08 NOTE — Therapy (Signed)
[place] MAIN [facility] SERVICES [address] [place], [place], [address] Phone: [phone]   Fax:  [phone]  Physical Therapy Treatment  Patient Details  Name: Susan Callahan MRN: [id number] Date of Birth: [birth year] Referring Provider (PT): Francesco Sor   Encounter Date: 10/08/2018  PT End of Session - 10/08/18 0813    Visit Number  18    Number of Visits  22    Date for PT Re-Evaluation  10/18/18    Authorization Type  9/10 start 08/22/18    PT Start Time  0802    PT Stop Time  0845    PT Time Calculation (min)  43 min    Activity Tolerance  Patient tolerated treatment well    Behavior During Therapy  [facility] for tasks assessed/performed       Past Medical History:  Diagnosis Date  . COPD (chronic obstructive pulmonary disease) ([place])   . Depression 1974  . Diabetes mellitus without complication ([place])   . Hypertension   . Migraine   . 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 LUMBAR PERITONEAL SHUNT    . TYMPANOSTOMY TUBE PLACEMENT      There were no vitals filed for this visit.  Subjective Assessment - 10/08/18 0811    Subjective  Patient reports increased pain in left low back today; She states, "I fell over the weekend. I was working in the yard and I think that my left leg gave out." Patient reports that she has been sore ever since.     Pertinent History  Pain began in 2007 and has gradually got worse and has progressed to nonstop. Patient states pain last all day long, everyday with no change in symptoms. Medication helps briefly. States she has difficulty with showering and standing. States she has to stand with her knee bent due to pulling sensation in legs. Pain begins immediately when stands and begins to walk. Pain feels better when she bends her knees when standing. Pain became constant and non stop around Dec 2018. Denies bowel or bladder changes, unexplained  weakness, or unexpected weight loss/gain. She currently goes to pulmonary rehab 3x a week, but has increased pain with treadmill walking. Will walk with cane on occasion around house especially at night due to occasional dizziness and vision impairments in medical history. States she also has difficulty with carrying groceries up steps due to low back pain. She would like to be able to walk better and have less pain to be less reliant on medication.     Limitations  Lifting;Standing;Walking;House hold activities    How long can you stand comfortably?  pain begins immediately     How long can you walk comfortably?  pain begins immediately     Patient Stated Goals  walk better, have less pain    Currently in Pain?  Yes    Pain Score  8     Pain Location  Back    Pain Orientation  Left;Lower    Pain Descriptors / Indicators  Aching;Sore    Pain Type  Chronic pain    Pain Radiating Towards  radiates into BLE    Pain Onset  More than a month ago    Pain Frequency  Constant    Aggravating Factors   worse with all movement    Pain Relieving Factors  unable to find a comfortable resting position; has not tried heat/ice  Effect of Pain on Daily Activities  decreased activity tolerance;     Multiple Pain Sites  No             TREATMENT:  Therapeutic exercises: Hooklying with cryotherapy to low back to reduce discomfort, concurrent with all exercise:  L/R single knee to chest stretch2x 30s, required min VCs to increase hold time for better stretch and tissue extensibility;  Lumbar trunk rotation x2 min with cues to avoid painful ROM;   TENS cross pattern on lumbar spine 120 Hz 50 Micos level 6; education on placement of pads, physics of TENS unit.  x10 min at rest in hooklying position;  TENS x10 min with hooklying exercise to increase tolerance with LE movement: TrA activation with march, cues for keeping core stabilized throughout entire leg lift and eccentric drop, reset between  legs. 10x each LE Hooklying HS stretch with DF/PF for sciatic nerve mobilityx20bilateral; Posterior pelvic tilt 5x10 second holds Gluteal squeezes in hooklying position 10x 5 seconds Required min VCs to avoid lifting into bridge for less discomfort;   Patient tolerated well; Reinforced importance of moving throughout day and avoiding prolonged sitting or prolonged standing; Reinforced HEP; Pain reduced to 2/10 at end of session.                     PT Education - 10/08/18 [phone]    Education Details  exercise/TENS/ HEP reinforced;     Person(s) Educated  Patient    Methods  Explanation;Verbal cues    Comprehension  Verbalized understanding;Returned demonstration;Verbal cues required;Need further instruction       PT Short Term Goals - 09/20/18 1052      PT SHORT TERM GOAL #1   Title  Patient will be independent with completion of HEP to improve ability to complete functional tasks and functional ADLs.    Baseline  10/23: will give HEP next session 11/19: HEP compliance    Time  2    Period  Weeks    Status  Achieved      PT SHORT TERM GOAL #2   Title  Patient will report pain score less than 4/10 on VAS to demonstrate reduction of pain levels with functional tasks.     Baseline  10/23: 6/10 11/19: 5/10 12/12: 9/10 1/10: 7/10     Time  2    Period  Weeks    Status  Partially Met        PT Long Term Goals - 09/20/18 1052      PT LONG TERM GOAL #1   Title  Patient will report pain score of <3/10 on VAS for decreased pain levels and ease with functional activities.    Baseline  10/23: 6/10 11/19: 5/10 12/12 9/10 1/10: 7/10     Time  4    Period  Weeks    Status  Partially Met    Target Date  10/18/18      PT LONG TERM GOAL #2   Title  Patient will be able to ambulate on treadmill for 41mnutes without increase in back/leg pain demonstrating improved community ambulation and treadmill walking at pulmonary rehab    Baseline  10/23: 0446mutes increases  pain 11/19: 46m71mtes 12/12: has not had the chance to perform 1/10: was able to do 5 minutes without pain    Time  4    Period  Weeks    Status  Partially Met    Target Date  10/18/18  PT LONG TERM GOAL #3   Title  Patient will have decreased modified oswestry score <50% for improved ability to complete household tasks with less pain levels.     Baseline  10/23: 62% 11/19: 42%    Time  4    Period  Weeks    Status  Achieved      PT LONG TERM GOAL #4   Title  Patient will score 4+/5 on BLE MMT to demonstrate improved glute strength and ability to carry groceries with increased ease up/down stairs at home.    Baseline  10/23: 4-/5 grossly 11/19: 4/5 grossly; hamstrings 4-/5 12/12: 4/5 gross 1/10: 4/5 gross     Time  4    Period  Weeks    Status  Partially Met    Target Date  10/18/18      PT LONG TERM GOAL #5   Title  Patient will have decreased modified oswestry score <35% for improved ability to complete household tasks with less pain levels.     Baseline  11/19: 42% 12/12: 52% 1/10: 48%     Time  4    Period  Weeks    Status  Partially Met    Target Date  10/18/18            Plan - 10/08/18 1962    Clinical Impression Statement  Patient tolerated session fair. She is having increased pain today from a recent fall. Patient tolerated TENs well with less pain especially with exercise. She is able to exhibit better core stabilization with verbal cues. Patient reported less pain in low back pain at end of session. She would benefit from additional skilled PT Intervention to improve strength, balance and gait safety;     Rehab Potential  Fair    Clinical Impairments Affecting Rehab Potential  (+) motivation (-) chronicity of symptoms    PT Frequency  2x / week    PT Duration  4 weeks    PT Treatment/Interventions  ADLs/Self Care Home Management;Cryotherapy;Moist Heat;Traction;Iontophoresis 52m/ml Dexamethasone;Ultrasound;Gait training;Stair training;Functional mobility  training;Neuromuscular re-education;Balance training;Therapeutic exercise;Therapeutic activities;Patient/family education;Manual techniques;Energy conservation;Aquatic Therapy;Electrical Stimulation;Passive range of motion;Dry needling;Taping    PT Next Visit Plan  nerve glides, strengthening    PT Home Exercise Plan  see sheet    Consulted and Agree with Plan of Care  Patient       Patient will benefit from skilled therapeutic intervention in order to improve the following deficits and impairments:  Abnormal gait, Cardiopulmonary status limiting activity, Decreased activity tolerance, Decreased balance, Decreased endurance, Decreased range of motion, Difficulty walking, Decreased strength, Hypomobility, Decreased mobility, Impaired perceived functional ability, Impaired flexibility, Postural dysfunction, Obesity, Improper body mechanics, Pain  Visit Diagnosis: Chronic low back pain, unspecified back pain laterality, unspecified whether sciatica present  Abnormal posture  Muscle weakness (generalized)  Other abnormalities of gait and mobility     Problem List Patient Active Problem List   Diagnosis Date Noted  . Elevated rheumatoid factor 08/13/2018  . Elevated C-reactive protein (CRP) 08/12/2018  . Elevated sed rate 08/12/2018  . Lumbar facet joint syndrome 08/12/2018  . Back pain with left-sided radiculopathy 08/12/2018  . DDD (degenerative disc disease), lumbar 08/12/2018  . Chronic lower extremity pain (Primary Area of Pain) (Bilateral) (L>R) 07/23/2018  . Chronic low back pain (Secondary Area of Pain) (Bilateral) (L>R) w/ sciatica (Bilateral) 07/23/2018  . Chronic pain syndrome 07/23/2018  . Pharmacologic therapy 07/23/2018  . Disorder of skeletal system 07/23/2018  . Problems influencing health status 07/23/2018  .  Chronic sacroiliac joint pain [facility] Area of Pain) (Bilateral) (L>R) 07/23/2018  . Status post lumboperitoneal shunt placement 02/26/2018  . Osteoarthritis  of first carpometacarpal joint of hand (Left) 05/04/2017  . Tympanic membrane perforation 05/30/2016  . PMB (postmenopausal bleeding) 05/26/2016  . Rash 12/27/2015  . 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 Plantar fasciitis of foot (Left) 05/26/2015  . Spondylosis of lumbar region without myelopathy or radiculopathy 05/26/2015  . Encounter for screening mammogram for malignant neoplasm of breast 12/02/2014  . Goiter 12/02/2014  . Chronic idiopathic gout of foot (Right) 07/06/2014  . Biceps tendonitis (Right) 05/19/2014  . Shoulder pain (Right) 05/19/2014  . Facial weakness 05/05/2014  . Post herpetic neuralgia 05/05/2014  . Chronic dysfunction of left eustachian tube 03/24/2014  . Osteopenia 03/13/2014  . Family history of colon cancer 12/02/2013  . Disequilibrium syndrome 08/26/2013  . Diastasis recti 04/08/2013  . Facial nerve paresis 07/31/2012  . Blepharospasm 05/08/2012  . 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 Obesity 03/28/2012  . Hemoptysis 12/22/2011  . Neuropathic postherpetic trigeminal neuralgia 12/12/2011  . Allergic rhinitis, mild 10/12/2011  . Chronic bronchitis ([place]) 06/05/2011  . Essential hypertension 05/24/2011  . Type 2 diabetes mellitus without complications ([place]) 75/91/6384  . Pulmonary nodules 05/05/2011    Chaun Uemura PT, DPT 10/08/2018, 10:36 AM  [place] MAIN [facility] SERVICES [address] [place], [place], [address] Phone: [phone]   Fax:  [phone]  Name: Susan Callahan MRN: [id number] Date of Birth: 06-Aug-1951

## 2018-10-09 ENCOUNTER — Ambulatory Visit: Payer: Medicare Other

## 2018-10-09 DIAGNOSIS — G8929 Other chronic pain: Secondary | ICD-10-CM

## 2018-10-09 DIAGNOSIS — M545 Low back pain, unspecified: Secondary | ICD-10-CM

## 2018-10-09 DIAGNOSIS — M6281 Muscle weakness (generalized): Secondary | ICD-10-CM

## 2018-10-09 DIAGNOSIS — R293 Abnormal posture: Secondary | ICD-10-CM

## 2018-10-09 DIAGNOSIS — R2689 Other abnormalities of gait and mobility: Secondary | ICD-10-CM

## 2018-10-09 NOTE — Therapy (Signed)
[place] MAIN [facility] SERVICES [address] [place], [place], [address] Phone: [phone]   Fax:  [phone]  Physical Therapy Treatment/Physical Therapy Progress Note   Dates of reporting period  08/22/18   to   10/09/18  Patient Details  Name: Susan Callahan MRN: [id number] Date of Birth: 1951-06-02 Referring Provider (PT): Francesco Sor   Encounter Date: 10/09/2018  PT End of Session - 10/09/18 1747    Visit Number  19    Number of Visits  22    Date for PT Re-Evaluation  10/18/18    Authorization Type  10/10 start 08/22/18 (next 1/10 starting 1/29)     PT Start Time  1345    PT Stop Time  1429    PT Time Calculation (min)  44 min    Activity Tolerance  Patient tolerated treatment well;Patient limited by pain    Behavior During Therapy  [facility] for tasks assessed/performed       Past Medical History:  Diagnosis Date  . COPD (chronic obstructive pulmonary disease) ([place])   . Depression 1974  . Diabetes mellitus without complication ([place])   . Hypertension   . Migraine   . 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 LUMBAR PERITONEAL SHUNT    . TYMPANOSTOMY TUBE PLACEMENT      There were no vitals filed for this visit.  Subjective Assessment - 10/09/18 1744    Subjective  Patient reports she continues to have pain and soreness throughout the left side of her body after her fall over the weekend.     Pertinent History  Pain began in 2007 and has gradually got worse and has progressed to nonstop. Patient states pain last all day long, everyday with no change in symptoms. Medication helps briefly. States she has difficulty with showering and standing. States she has to stand with her knee bent due to pulling sensation in legs. Pain begins immediately when stands and begins to walk. Pain feels better when she bends her knees when standing. Pain became constant and non stop around Dec  2018. Denies bowel or bladder changes, unexplained weakness, or unexpected weight loss/gain. She currently goes to pulmonary rehab 3x a week, but has increased pain with treadmill walking. Will walk with cane on occasion around house especially at night due to occasional dizziness and vision impairments in medical history. States she also has difficulty with carrying groceries up steps due to low back pain. She would like to be able to walk better and have less pain to be less reliant on medication.     Limitations  Lifting;Standing;Walking;House hold activities    How long can you stand comfortably?  pain begins immediately     How long can you walk comfortably?  pain begins immediately     Patient Stated Goals  walk better, have less pain    Currently in Pain?  Yes    Pain Score  7     Pain Location  Back    Pain Orientation  Left;Lower    Pain Descriptors / Indicators  Aching;Sore    Pain Type  Chronic pain    Pain Onset  More than a month ago    Pain Frequency  Constant    Aggravating Factors   worse with movement    Pain Relieving Factors  only able to lay in bed to find relief    Effect of Pain on Daily  Activities  decreased activity tolerance        Goals re-assessed 1/10 with progress.  TREATMENT:   Therapeutic exercises: performed with verbal/visual cues, CGA for seated on swiss ball activities. Goal: improve mobility, address neural tension    L/R single knee to chest stretch  3x 30s, ; Seated swiss ball forward roll out 10 x10sec holds, diagonal stretch 5x each side Seated sciatic nerve glides x10 ea LE, verbal/visual cues TrA activation seated 10x 5 second holds for postural control Hooklying HS stretch with DF/PF for sciatic nerve mobility x20 bilateral; Posterior pelvic tilt 10x 5 second holds  Seated hamstring stretch 2x 30 seconds each LE.   LE rotation for low back relaxation  seated df/pf heel toe rocking for coordination, strength, and general wellness  20x  Manual therapy:  STM to lumbar and thoracic paraspinals, focus on L side for muscle tension, pain relief x 7 minutes to increase muscle tissue length and reduce pain.  Hamstring stretch 2x60 seconds each LE    TENS cross pattern on lumbar spine 120 Hz 50 Micos level 5; education on placement of pads, physics of TENS unit.          Patient's condition has the potential to improve in response to therapy. Maximum improvement is yet to be obtained. The anticipated improvement is attainable and reasonable in a generally predictable time.  Patient reports she was progressing very well prior to her fall, feels like her fall set her back a little.                     PT Education - 10/09/18 1746    Education Details  exercise/TENS/ POC/ gentle stretches    Person(s) Educated  Patient    Methods  Explanation;Demonstration;Tactile cues;Verbal cues    Comprehension  Verbalized understanding;Returned demonstration;Verbal cues required;Tactile cues required       PT Short Term Goals - 09/20/18 1052      PT SHORT TERM GOAL #1   Title  Patient will be independent with completion of HEP to improve ability to complete functional tasks and functional ADLs.    Baseline  10/23: will give HEP next session 11/19: HEP compliance    Time  2    Period  Weeks    Status  Achieved      PT SHORT TERM GOAL #2   Title  Patient will report pain score less than 4/10 on VAS to demonstrate reduction of pain levels with functional tasks.     Baseline  10/23: 6/10 11/19: 5/10 12/12: 9/10 1/10: 7/10     Time  2    Period  Weeks    Status  Partially Met        PT Long Term Goals - 09/20/18 1052      PT LONG TERM GOAL #1   Title  Patient will report pain score of <3/10 on VAS for decreased pain levels and ease with functional activities.    Baseline  10/23: 6/10 11/19: 5/10 12/12 9/10 1/10: 7/10     Time  4    Period  Weeks    Status  Partially Met    Target Date  10/18/18       PT LONG TERM GOAL #2   Title  Patient will be able to ambulate on treadmill for 57mnutes without increase in back/leg pain demonstrating improved community ambulation and treadmill walking at pulmonary rehab    Baseline  10/23: 097mutes increases pain 11/19: 60m64mtes 12/12:  has not had the chance to perform 1/10: was able to do 5 minutes without pain    Time  4    Period  Weeks    Status  Partially Met    Target Date  10/18/18      PT LONG TERM GOAL #3   Title  Patient will have decreased modified oswestry score <50% for improved ability to complete household tasks with less pain levels.     Baseline  10/23: 62% 11/19: 42%    Time  4    Period  Weeks    Status  Achieved      PT LONG TERM GOAL #4   Title  Patient will score 4+/5 on BLE MMT to demonstrate improved glute strength and ability to carry groceries with increased ease up/down stairs at home.    Baseline  10/23: 4-/5 grossly 11/19: 4/5 grossly; hamstrings 4-/5 12/12: 4/5 gross 1/10: 4/5 gross     Time  4    Period  Weeks    Status  Partially Met    Target Date  10/18/18      PT LONG TERM GOAL #5   Title  Patient will have decreased modified oswestry score <35% for improved ability to complete household tasks with less pain levels.     Baseline  11/19: 42% 12/12: 52% 1/10: 48%     Time  4    Period  Weeks    Status  Partially Met    Target Date  10/18/18            Plan - 10/09/18 1748    Clinical Impression Statement  Patient's goals were re-assessed on 1/10 and progress can be referred to in the note for that day. Today's session focused on pain control due to fall over the weekend with patient reporting reduction of pain to 3/10 by end of session. Patient's condition has the potential to improve in response to therapy. Maximum improvement is yet to be obtained. The anticipated improvement is attainable and reasonable in a generally predictable time.Patient will continue to benefit from skilled physical therapy to  improve strength, reduce pain levels with ADLs, and improved quality of life    Rehab Potential  Fair    Clinical Impairments Affecting Rehab Potential  (+) motivation (-) chronicity of symptoms    PT Frequency  2x / week    PT Duration  4 weeks    PT Treatment/Interventions  ADLs/Self Care Home Management;Cryotherapy;Moist Heat;Traction;Iontophoresis 53m/ml Dexamethasone;Ultrasound;Gait training;Stair training;Functional mobility training;Neuromuscular re-education;Balance training;Therapeutic exercise;Therapeutic activities;Patient/family education;Manual techniques;Energy conservation;Aquatic Therapy;Electrical Stimulation;Passive range of motion;Dry needling;Taping    PT Next Visit Plan  nerve glides, strengthening    PT Home Exercise Plan  see sheet    Consulted and Agree with Plan of Care  Patient       Patient will benefit from skilled therapeutic intervention in order to improve the following deficits and impairments:  Abnormal gait, Cardiopulmonary status limiting activity, Decreased activity tolerance, Decreased balance, Decreased endurance, Decreased range of motion, Difficulty walking, Decreased strength, Hypomobility, Decreased mobility, Impaired perceived functional ability, Impaired flexibility, Postural dysfunction, Obesity, Improper body mechanics, Pain  Visit Diagnosis: Chronic low back pain, unspecified back pain laterality, unspecified whether sciatica present  Abnormal posture  Muscle weakness (generalized)  Other abnormalities of gait and mobility     Problem List Patient Active Problem List   Diagnosis Date Noted  . Elevated rheumatoid factor 08/13/2018  . Elevated C-reactive protein (CRP) 08/12/2018  . Elevated sed rate 08/12/2018  .  Lumbar facet joint syndrome 08/12/2018  . Back pain with left-sided radiculopathy 08/12/2018  . DDD (degenerative disc disease), lumbar 08/12/2018  . Chronic lower extremity pain (Primary Area of Pain) (Bilateral) (L>R)  07/23/2018  . Chronic low back pain (Secondary Area of Pain) (Bilateral) (L>R) w/ sciatica (Bilateral) 07/23/2018  . Chronic pain syndrome 07/23/2018  . Pharmacologic therapy 07/23/2018  . Disorder of skeletal system 07/23/2018  . Problems influencing health status 07/23/2018  . Chronic sacroiliac joint pain [facility] Area of Pain) (Bilateral) (L>R) 07/23/2018  . Status post lumboperitoneal shunt placement 02/26/2018  . 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 Tympanic membrane perforation 05/30/2016  . PMB (postmenopausal bleeding) 05/26/2016  . Rash 12/27/2015  . 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 Plantar fasciitis of foot (Left) 05/26/2015  . Spondylosis of lumbar region without myelopathy or radiculopathy 05/26/2015  . Encounter for screening mammogram for malignant neoplasm of breast 12/02/2014  . Goiter 12/02/2014  . Chronic idiopathic gout of foot (Right) 07/06/2014  . Biceps tendonitis (Right) 05/19/2014  . Shoulder pain (Right) 05/19/2014  . Facial weakness 05/05/2014  . Post herpetic neuralgia 05/05/2014  . Chronic dysfunction of left eustachian tube 03/24/2014  . Osteopenia 03/13/2014  . Family history of colon cancer 12/02/2013  . Disequilibrium syndrome 08/26/2013  . Diastasis recti 04/08/2013  . Facial nerve paresis 07/31/2012  . Blepharospasm 05/08/2012  . 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 Obesity 03/28/2012  . Hemoptysis 12/22/2011  . Neuropathic postherpetic trigeminal neuralgia 12/12/2011  . Allergic rhinitis, mild 10/12/2011  . Chronic bronchitis ([place]) 06/05/2011  . Essential hypertension 05/24/2011  . Type 2 diabetes mellitus without complications ([place]) 31/51/7616  . Pulmonary nodules 05/05/2011   Janna Arch, PT, DPT   10/09/2018, 5:49 PM  [place] MAIN [facility] SERVICES [address]  [place], [place], [address] Phone: [phone]   Fax:  [phone]  Name: Susan Callahan MRN: [id number] Date of Birth: [birth year]

## 2018-10-10 ENCOUNTER — Encounter: Payer: Self-pay | Admitting: Pain Medicine

## 2018-10-10 ENCOUNTER — Ambulatory Visit (HOSPITAL_BASED_OUTPATIENT_CLINIC_OR_DEPARTMENT_OTHER): Payer: Medicare Other | Admitting: Pain Medicine

## 2018-10-10 ENCOUNTER — Ambulatory Visit
Admission: RE | Admit: 2018-10-10 | Discharge: 2018-10-10 | Disposition: A | Discharge status: Home / Self Care | Payer: Medicare Other | Source: Ambulatory Visit | Attending: Pain Medicine | Admitting: Pain Medicine

## 2018-10-10 ENCOUNTER — Ambulatory Visit: Payer: Medicare Other

## 2018-10-10 ENCOUNTER — Other Ambulatory Visit: Payer: Self-pay

## 2018-10-10 VITALS — BP 138/80 | HR 68 | Temp 99.3°F | Resp 24 | Ht 65.0 in | Wt 205.0 lb

## 2018-10-10 DIAGNOSIS — M79605 Pain in left leg: Secondary | ICD-10-CM

## 2018-10-10 DIAGNOSIS — M4310 Spondylolisthesis, site unspecified: Secondary | ICD-10-CM | POA: Diagnosis present

## 2018-10-10 DIAGNOSIS — M5137 Other intervertebral disc degeneration, lumbosacral region: Secondary | ICD-10-CM

## 2018-10-10 DIAGNOSIS — M48061 Spinal stenosis, lumbar region without neurogenic claudication: Secondary | ICD-10-CM | POA: Insufficient documentation

## 2018-10-10 DIAGNOSIS — M532X6 Spinal instabilities, lumbar region: Secondary | ICD-10-CM | POA: Insufficient documentation

## 2018-10-10 DIAGNOSIS — G894 Chronic pain syndrome: Secondary | ICD-10-CM | POA: Insufficient documentation

## 2018-10-10 DIAGNOSIS — M79604 Pain in right leg: Secondary | ICD-10-CM | POA: Diagnosis present

## 2018-10-10 DIAGNOSIS — M48062 Spinal stenosis, lumbar region with neurogenic claudication: Secondary | ICD-10-CM | POA: Insufficient documentation

## 2018-10-10 DIAGNOSIS — G8929 Other chronic pain: Secondary | ICD-10-CM | POA: Diagnosis present

## 2018-10-10 DIAGNOSIS — R937 Abnormal findings on diagnostic imaging of other parts of musculoskeletal system: Secondary | ICD-10-CM | POA: Insufficient documentation

## 2018-10-10 DIAGNOSIS — M51379 Other intervertebral disc degeneration, lumbosacral region without mention of lumbar back pain or lower extremity pain: Secondary | ICD-10-CM | POA: Insufficient documentation

## 2018-10-10 DIAGNOSIS — M5416 Radiculopathy, lumbar region: Secondary | ICD-10-CM | POA: Insufficient documentation

## 2018-10-10 DIAGNOSIS — M47816 Spondylosis without myelopathy or radiculopathy, lumbar region: Secondary | ICD-10-CM | POA: Insufficient documentation

## 2018-10-10 MED ORDER — TRIAMCINOLONE ACETONIDE 40 MG/ML IJ SUSP
40.0000 mg | Freq: Once | INTRAMUSCULAR | Status: AC
  Administered 2018-10-10: 40 mg
  Filled 2018-10-10: qty 1

## 2018-10-10 MED ORDER — LIDOCAINE HCL 2 % IJ SOLN
20.0000 mL | Freq: Once | INTRAMUSCULAR | Status: AC
  Administered 2018-10-10: 400 mg
  Filled 2018-10-10: qty 40

## 2018-10-10 MED ORDER — ROPIVACAINE HCL 2 MG/ML IJ SOLN
1.0000 mL | Freq: Once | INTRAMUSCULAR | Status: AC
  Administered 2018-10-10: 1 mL via EPIDURAL
  Filled 2018-10-10: qty 10

## 2018-10-10 MED ORDER — TRAMADOL HCL 50 MG PO TABS: 50.0000 mg | ORAL_TABLET | Freq: Four times a day (QID) | ORAL | 0 refills | Status: DC | PRN

## 2018-10-10 MED ORDER — IOPAMIDOL (ISOVUE-M 200) INJECTION 41%
10.0000 mL | Freq: Once | INTRAMUSCULAR | Status: AC
  Administered 2018-10-10: 10 mL via EPIDURAL
  Filled 2018-10-10: qty 10

## 2018-10-10 MED ORDER — SODIUM CHLORIDE 0.9% FLUSH
1.0000 mL | Freq: Once | INTRAVENOUS | Status: AC
  Administered 2018-10-10: 1 mL

## 2018-10-10 MED ORDER — SODIUM CHLORIDE 0.9% FLUSH
2.0000 mL | Freq: Once | INTRAVENOUS | Status: AC
  Administered 2018-10-10: 2 mL

## 2018-10-10 MED ORDER — DEXAMETHASONE SODIUM PHOSPHATE 10 MG/ML IJ SOLN
10.0000 mg | Freq: Once | INTRAMUSCULAR | Status: AC
  Administered 2018-10-10: 10 mg
  Filled 2018-10-10: qty 1

## 2018-10-10 MED ORDER — LACTATED RINGERS IV SOLN
1000.0000 mL | Freq: Once | INTRAVENOUS | Status: AC
  Administered 2018-10-10: 1000 mL via INTRAVENOUS

## 2018-10-10 MED ORDER — MIDAZOLAM HCL 5 MG/5ML IJ SOLN
1.0000 mg | INTRAMUSCULAR | Status: DC | PRN
  Administered 2018-10-10: 2 mg via INTRAVENOUS
  Filled 2018-10-10: qty 5

## 2018-10-10 MED ORDER — ROPIVACAINE HCL 2 MG/ML IJ SOLN
2.0000 mL | Freq: Once | INTRAMUSCULAR | Status: AC
  Administered 2018-10-10: 2 mL via EPIDURAL
  Filled 2018-10-10: qty 10

## 2018-10-10 MED ORDER — FENTANYL CITRATE (PF) 100 MCG/2ML IJ SOLN
25.0000 ug | INTRAMUSCULAR | Status: DC | PRN
  Administered 2018-10-10: 50 ug via INTRAVENOUS
  Filled 2018-10-10: qty 2

## 2018-10-10 NOTE — Patient Instructions (Signed)

## 2018-10-10 NOTE — Progress Notes (Signed)
Patient's Name: Susan Callahan  MRN: [id number]  Referring Provider: Konrad Saha, MD  DOB: 17-Aug-1951  PCP: Konrad Saha, MD  DOS: 10/10/2018  Note by: Gaspar Cola, MD  Service setting: Ambulatory outpatient  Specialty: Interventional Pain Management  Patient type: Established  Location: [facility] (AMB) Pain Management Facility  Visit type: Interventional Procedure   Primary Reason for Visit: Interventional Pain Management Treatment. CC: Back Pain (low)  Procedure #1:  Anesthesia, Analgesia, Anxiolysis:  Type: Therapeutic Trans-Foraminal Epidural Steroid Injection   #1  Region: Lumbar Level: L4 Paravertebral Laterality: Bilateral Paravertebral   Type: Moderate (Conscious) Sedation combined with Local Anesthesia Indication(s): Analgesia and Anxiety Route: Intravenous (IV) IV Access: Secured Sedation: Meaningful verbal contact was maintained at all times during the procedure  Local Anesthetic: Lidocaine 1-2%  Position: Prone  Procedure #2:    Type: Therapeutic Inter-Laminar Epidural Steroid Injection   #1  Region: Lumbar Level: L3-4 Level. Laterality: Left-Sided Paramedial     Indications: 1. DDD (degenerative disc disease), lumbosacral   2. 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 Chronic lower extremity pain (Primary Area of Pain) (Bilateral) (L>R)   4. 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 Lumbar Grade 1 Anterolisthesis of L3/4 (9mm) & L4/5 (4-66mm)(w/ Dynamic instability)   7. 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 Abnormal MRI, lumbar spine (08/29/2018)    Pain Score: Pre-procedure: 3 /10 Post-procedure: 0-No pain/10  Pre-op Assessment:  Susan Callahan is a 68 y.o. (year old), female patient, seen today for interventional treatment. She  has a past surgical history that includes Tympanostomy tube placement and Lumbar peritoneal shunt. Ms.  Callahan has a current medication list which includes the following prescription(s): allopurinol, amitriptyline, b complex vitamins, calcium carbonate-vitamin d, furosemide, gabapentin, magnesium oxide, metformin, multivitamin, mupirocin ointment, spironolactone, umeclidinium bromide, verapamil, and tramadol, and the following Facility-Administered Medications: fentanyl, lactated ringers, and midazolam. Her primarily concern today is the Back Pain (low)  Initial Vital Signs:  Pulse/HCG Rate: 68ECG Heart Rate: 73 Temp: 97.9 F (36.6 C) Resp: 18 BP: 131/88 SpO2: 98 %  BMI: Estimated body mass index is 34.11 kg/m as calculated from the following:   Height as of this encounter: 5\' 5"  (1.651 m).   Weight as of this encounter: 205 lb (93 kg).  Risk Assessment: Allergies: Reviewed. She is allergic to penicillins; sulfa antibiotics; and bee venom.  Allergy Precautions: None required Coagulopathies: Reviewed. None identified.  Blood-thinner therapy: None at this time Active Infection(s): Reviewed. None identified. Susan Callahan is afebrile  Site Confirmation: Susan Callahan was asked to confirm the procedure and laterality before marking the site Procedure checklist: Completed Consent: Before the procedure and under the influence of no sedative(s), amnesic(s), or anxiolytics, the patient was informed of the treatment options, risks and possible complications. To fulfill our ethical and legal obligations, as recommended by the American Medical Association's Code of Ethics, I have informed the patient of my clinical impression; the nature and purpose of the treatment or procedure; the risks, benefits, and possible complications of the intervention; the alternatives, including doing nothing; the risk(s) and benefit(s) of the alternative treatment(s) or procedure(s); and the risk(s) and benefit(s) of doing nothing. The patient was provided information about the general risks and possible complications associated  with the procedure. These may include, but are not limited to: failure to achieve desired goals, infection, bleeding, organ or  nerve damage, allergic reactions, paralysis, and death. In addition, the patient was informed of those risks and complications associated to Spine-related procedures, such as failure to decrease pain; infection (i.e.: Meningitis, epidural or intraspinal abscess); bleeding (i.e.: epidural hematoma, subarachnoid hemorrhage, or any other type of intraspinal or peri-dural bleeding); organ or nerve damage (i.e.: Any type of peripheral nerve, nerve root, or spinal cord injury) with subsequent damage to sensory, motor, and/or autonomic systems, resulting in permanent pain, numbness, and/or weakness of one or several areas of the body; allergic reactions; (i.e.: anaphylactic reaction); and/or death. Furthermore, the patient was informed of those risks and complications associated with the medications. These include, but are not limited to: allergic reactions (i.e.: anaphylactic or anaphylactoid reaction(s)); adrenal axis suppression; blood sugar elevation that in diabetics may result in ketoacidosis or comma; water retention that in patients with history of congestive heart failure may result in shortness of breath, pulmonary edema, and decompensation with resultant heart failure; weight gain; swelling or edema; medication-induced neural toxicity; particulate matter embolism and blood vessel occlusion with resultant organ, and/or nervous system infarction; and/or aseptic necrosis of one or more joints. Finally, the patient was informed that Medicine is not an exact science; therefore, there is also the possibility of unforeseen or unpredictable risks and/or possible complications that may result in a catastrophic outcome. The patient indicated having understood very clearly. We have given the patient no guarantees and we have made no promises. Enough time was given to the patient to ask  questions, all of which were answered to the patient's satisfaction. Susan Callahan has indicated that she wanted to continue with the procedure. Attestation: I, the ordering provider, attest that I have discussed with the patient the benefits, risks, side-effects, alternatives, likelihood of achieving goals, and potential problems during recovery for the procedure that I have provided informed consent. Date  Time: 10/10/2018  9:37 AM  Pre-Procedure Preparation:  Monitoring: As per clinic protocol. Respiration, ETCO2, SpO2, BP, heart rate and rhythm monitor placed and checked for adequate function Safety Precautions: Patient was assessed for positional comfort and pressure points before starting the procedure. Time-out: I initiated and conducted the "Time-out" before starting the procedure, as per protocol. The patient was asked to participate by confirming the accuracy of the "Time Out" information. Verification of the correct person, site, and procedure were performed and confirmed by me, the nursing staff, and the patient. "Time-out" conducted as per Joint Commission's Universal Protocol (UP.01.01.01). Time: 1040  Description of Procedure #1:  Target Area: The inferior and lateral portion of the pedicle, just lateral to a line created by the 6:00 position of the pedicle and the superior articular process of the vertebral body below. On the lateral view, this target lies just posterior to the anterior aspect of the lamina and posterior to the midpoint created between the anterior and the posterior aspect of the neural foramina. Approach: Posterior paravertebral approach. Area Prepped: Entire Posterior Lumbosacral Region Prepping solution: ChloraPrep (2% chlorhexidine gluconate and 70% isopropyl alcohol) Safety Precautions: Aspiration looking for blood return was conducted prior to all injections. At no point did we inject any substances, as a needle was being advanced. No attempts were made at seeking  any paresthesias. Safe injection practices and needle disposal techniques used. Medications properly checked for expiration dates. SDV (single dose vial) medications used.  Description of the Procedure: Protocol guidelines were followed. The patient was placed in position over the fluoroscopy table. The target area was identified and the area prepped in the  usual manner. Skin & deeper tissues infiltrated with local anesthetic. Appropriate amount of time allowed to pass for local anesthetics to take effect. The procedure needles were then advanced to the target area. Proper needle placement secured. Negative aspiration confirmed. Solution injected in intermittent fashion, asking for systemic symptoms every 0.2cc of injectate. The needles were then removed and the area cleansed, making sure to leave some of the prepping solution back to take advantage of its long term bactericidal properties.  Start Time: 1040 hrs.  Materials:  Needle(s) Type: Spinal Needle Gauge: 22G Length: 3.5-in Medication(s): Please see orders for medications and dosing details.  Description of Procedure #2:  Target Area: The  interlaminar space, initially targeting the lower border of the superior vertebral body lamina. Approach: Posterior paramedial approach. Area Prepped: Same as above Prepping solution: Same as above Safety Precautions: Same as above  Description of the Procedure: Protocol guidelines were followed. The patient was placed in position over the fluoroscopy table. The target area was identified and the area prepped in the usual manner. Skin & deeper tissues infiltrated with local anesthetic. Appropriate amount of time allowed to pass for local anesthetics to take effect. The procedure needle was introduced through the skin, ipsilateral to the reported pain, and advanced to the target area. Bone was contacted and the needle walked caudad, until the lamina was cleared. The ligamentum flavum was engaged and  loss-of-resistance technique used as the epidural needle was advanced. The epidural space was identified using "loss-of-resistance technique" with 2-3 ml of PF-NaCl (0.9% NSS), in a 5cc LOR glass syringe. Proper needle placement secured. Negative aspiration confirmed. Solution injected in intermittent fashion, asking for systemic symptoms every 0.5cc of injectate. The needles were then removed and the area cleansed, making sure to leave some of the prepping solution back to take advantage of its long term bactericidal properties.  Vitals:   10/10/18 1059 10/10/18 1106 10/10/18 1118 10/10/18 1127  BP: 134/85 125/71 122/86 138/80  Pulse:      Resp: 16 17 13  (!) 24  Temp:  99.2 F (37.3 C)  99.3 F (37.4 C)  TempSrc:  Temporal  Temporal  SpO2: 94% 95% 97% 98%  Weight:      Height:        End Time: 1059 hrs.  Materials:  Needle(s) Type: Epidural needle Gauge: 17G Length: 3.5-in Medication(s): Please see orders for medications and dosing details.  Imaging Guidance (Spinal):          Type of Imaging Technique: Fluoroscopy Guidance (Spinal) Indication(s): Assistance in needle guidance and placement for procedures requiring needle placement in or near specific anatomical locations not easily accessible without such assistance. Exposure Time: Please see nurses notes. Contrast: Before injecting any contrast, we confirmed that the patient did not have an allergy to iodine, shellfish, or radiological contrast. Once satisfactory needle placement was completed at the desired level, radiological contrast was injected. Contrast injected under live fluoroscopy. No contrast complications. See chart for type and volume of contrast used. Fluoroscopic Guidance: I was personally present during the use of fluoroscopy. "Tunnel Vision Technique" used to obtain the best possible view of the target area. Parallax error corrected before commencing the procedure. "Direction-depth-direction" technique used to  introduce the needle under continuous pulsed fluoroscopy. Once target was reached, antero-posterior, oblique, and lateral fluoroscopic projection used confirm needle placement in all planes. Images permanently stored in EMR. Interpretation: I personally interpreted the imaging intraoperatively. Adequate needle placement confirmed in multiple planes. Appropriate spread of contrast into desired  area was observed. No evidence of afferent or efferent intravascular uptake. No intrathecal or subarachnoid spread observed. Permanent images saved into the patient's record.  Antibiotic Prophylaxis:   Anti-infectives (From admission, onward)   None     Indication(s): None identified  Post-operative Assessment:  Post-procedure Vital Signs:  Pulse/HCG Rate: 6869 Temp: 99.3 F (37.4 C) Resp: (!) 24 BP: 138/80 SpO2: 98 %  EBL: None  Complications: No immediate post-treatment complications observed by team, or reported by patient.  Note: The patient tolerated the entire procedure well. A repeat set of vitals were taken after the procedure and the patient was kept under observation following institutional policy, for this type of procedure. Post-procedural neurological assessment was performed, showing return to baseline, prior to discharge. The patient was provided with post-procedure discharge instructions, including a section on how to identify potential problems. Should any problems arise concerning this procedure, the patient was given instructions to immediately contact us, at any time, without hesitation. In any case, we plan to contact the patient by telephone for a follow-up status report regarding this interventional procedure.  Comments:  No additional relevant information.  Plan of Care    Imaging Orders     DG C-Arm 1-60 Min-No Report  Procedure Orders     Lumbar Transforaminal Epidural     Lumbar Epidural Injection  Medications ordered for procedure: Meds ordered this encounter   Medications  . iopamidol (ISOVUE-M) 41 % intrathecal injection 10 mL    Must be Myelogram-compatible. If not available, you may substitute with a water-soluble, non-ionic, hypoallergenic, myelogram-compatible radiological contrast medium.  Marland Kitchen lidocaine (XYLOCAINE) 2 % (with pres) injection 400 mg  . midazolam (VERSED) 5 MG/5ML injection 1-2 mg    Make sure Flumazenil is available in the pyxis when using this medication. If oversedation occurs, administer 0.2 mg IV over 15 sec. If after 45 sec no response, administer 0.2 mg again over 1 min; may repeat at 1 min intervals; not to exceed 4 doses (1 mg)  . fentaNYL (SUBLIMAZE) injection 25-50 mcg    Make sure Narcan is available in the pyxis when using this medication. In the event of respiratory depression (RR< 8/min): Titrate NARCAN (naloxone) in increments of 0.1 to 0.2 mg IV at 2-3 minute intervals, until desired degree of reversal.  . lactated ringers infusion 1,000 mL  . sodium chloride flush (NS) 0.9 % injection 1 mL  . ropivacaine (PF) 2 mg/mL (0.2%) (NAROPIN) injection 1 mL  . dexamethasone (DECADRON) injection 10 mg  . sodium chloride flush (NS) 0.9 % injection 1 mL  . ropivacaine (PF) 2 mg/mL (0.2%) (NAROPIN) injection 1 mL  . dexamethasone (DECADRON) injection 10 mg  . sodium chloride flush (NS) 0.9 % injection 2 mL  . ropivacaine (PF) 2 mg/mL (0.2%) (NAROPIN) injection 2 mL  . triamcinolone acetonide (KENALOG-40) injection 40 mg  . traMADol (ULTRAM) 50 MG tablet    Sig: Take 1 tablet (50 mg total) by mouth every 6 (six) hours as needed for up to 30 days for severe pain. Month last 30 days.    Dispense:  120 tablet    Refill:  0    [place] STOP ACT - Not applicable. Fill one day early if pharmacy is closed on scheduled refill date. Do not fill until: 10/10/18. Must last 30 days. To last until: 11/09/18.   Medications administered: We administered iopamidol, lidocaine, midazolam, fentaNYL, lactated ringers, sodium chloride flush,  ropivacaine (PF) 2 mg/mL (0.2%), dexamethasone, sodium chloride flush, ropivacaine (PF) 2  mg/mL (0.2%), dexamethasone, sodium chloride flush, ropivacaine (PF) 2 mg/mL (0.2%), and triamcinolone acetonide.  See the medical record for exact dosing, route, and time of administration.  Disposition: Discharge home  Discharge Date & Time: 10/10/2018; 1130 hrs.   Physician-requested Follow-up: Return for post-procedure eval (2 wks), w/ Dr. Dossie Arbour.  Future Appointments  Date Time Provider [place]  10/14/2018  2:30 PM Janna Arch, PT [facility] None  10/16/2018  2:30 PM Janna Arch, PT [facility] None  10/22/2018  4:00 PM Janna Arch, PT [facility] None  10/28/2018  2:00 PM Milinda Pointer, MD [facility] None  10/29/2018  8:00 AM Janna Arch, PT [facility] None  10/31/2018  8:00 AM Janna Arch, PT [facility] None  11/04/2018 11:00 AM Vevelyn Francois, NP [facility] None  11/05/2018  8:00 AM Janna Arch, PT [facility] None  11/07/2018  8:00 AM Janna Arch, PT [facility] None  11/12/2018  3:15 PM Janna Arch, PT [facility] None  11/14/2018  3:15 PM Janna Arch, PT [facility] None   Primary Care Physician: Konrad Saha, MD Location: [facility] Note by: Gaspar Cola, MD Date: 10/10/2018; Time: 11:35 AM  Disclaimer:  Medicine is not an exact science. The only guarantee in medicine is that nothing is guaranteed. It is important to note that the decision to proceed with this intervention was based on the information collected from the patient. The Data and conclusions were drawn from the patient's questionnaire, the interview, and the physical examination. Because the information was provided in large part by the patient, it cannot be guaranteed that it has not been purposely or unconsciously manipulated. Every effort has been made to obtain as much relevant data as possible for this evaluation. It is important to note that the conclusions that lead to this  procedure are derived in large part from the available data. Always take into account that the treatment will also be dependent on availability of resources and existing treatment guidelines, considered by other Pain Management Practitioners as being common knowledge and practice, at the time of the intervention. For Medico-Legal purposes, it is also important to point out that variation in procedural techniques and pharmacological choices are the acceptable norm. The indications, contraindications, technique, and results of the above procedure should only be interpreted and judged by a Board-Certified Interventional Pain Specialist with extensive familiarity and expertise in the same exact procedure and technique.

## 2018-10-10 NOTE — Progress Notes (Signed)
Safety precautions to be maintained throughout the outpatient stay will include: orient to surroundings, keep bed in low position, maintain call bell within reach at all times, provide assistance with transfer out of bed and ambulation.  

## 2018-10-11 ENCOUNTER — Telehealth: Payer: Self-pay

## 2018-10-11 NOTE — Telephone Encounter (Signed)
Post procedure phone call.  Patient states she is doing well.  

## 2018-10-14 ENCOUNTER — Ambulatory Visit: Payer: Medicare Other | Attending: Rehabilitation

## 2018-10-14 DIAGNOSIS — M6281 Muscle weakness (generalized): Secondary | ICD-10-CM

## 2018-10-14 DIAGNOSIS — R2689 Other abnormalities of gait and mobility: Secondary | ICD-10-CM | POA: Diagnosis present

## 2018-10-14 DIAGNOSIS — M545 Low back pain: Secondary | ICD-10-CM | POA: Diagnosis not present

## 2018-10-14 DIAGNOSIS — R293 Abnormal posture: Secondary | ICD-10-CM | POA: Insufficient documentation

## 2018-10-14 DIAGNOSIS — G8929 Other chronic pain: Secondary | ICD-10-CM | POA: Insufficient documentation

## 2018-10-14 NOTE — Therapy (Signed)
[place] MAIN [facility] SERVICES [address] [place], [place], [address] Phone: [phone]   Fax:  [phone]  Physical Therapy Treatment  Patient Details  Name: Susan Callahan MRN: [id number] Date of Birth: Jan 24, 1951 Referring Provider (PT): Francesco Sor   Encounter Date: 10/14/2018  PT End of Session - 10/14/18 1453    Visit Number  20    Number of Visits  22    Date for PT Re-Evaluation  10/18/18    Authorization Type  1/10 starting 1/29    PT Start Time  1430    PT Stop Time  1514    PT Time Calculation (min)  44 min    Activity Tolerance  Patient tolerated treatment well;Patient limited by pain    Behavior During Therapy  [facility] for tasks assessed/performed       Past Medical History:  Diagnosis Date  . COPD (chronic obstructive pulmonary disease) ([place])   . Depression 1974  . Diabetes mellitus without complication ([place])   . Hypertension   . Migraine   . 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 LUMBAR PERITONEAL SHUNT    . TYMPANOSTOMY TUBE PLACEMENT      There were no vitals filed for this visit.  Subjective Assessment - 10/14/18 1449    Subjective  Patient had her injection on Thursday last week and states it was feeling really good yesterday. She pruned her roses yesterday and played with her cat but woke up today with increased pain today.     Pertinent History  Pain began in 2007 and has gradually got worse and has progressed to nonstop. Patient states pain last all day long, everyday with no change in symptoms. Medication helps briefly. States she has difficulty with showering and standing. States she has to stand with her knee bent due to pulling sensation in legs. Pain begins immediately when stands and begins to walk. Pain feels better when she bends her knees when standing. Pain became constant and non stop around Dec 2018. Denies bowel or bladder changes,  unexplained weakness, or unexpected weight loss/gain. She currently goes to pulmonary rehab 3x a week, but has increased pain with treadmill walking. Will walk with cane on occasion around house especially at night due to occasional dizziness and vision impairments in medical history. States she also has difficulty with carrying groceries up steps due to low back pain. She would like to be able to walk better and have less pain to be less reliant on medication.     Limitations  Lifting;Standing;Walking;House hold activities    How long can you stand comfortably?  pain begins immediately     How long can you walk comfortably?  pain begins immediately     Patient Stated Goals  walk better, have less pain    Currently in Pain?  Yes    Pain Score  4     Pain Location  Leg    Pain Orientation  Lower    Pain Descriptors / Indicators  Aching;Sore    Pain Type  Chronic pain    Pain Onset  More than a month ago    Pain Frequency  Intermittent        TREATMENT:   Therapeutic exercises: performed with verbal/visual cues, CGA and task orientation. Goal: improve mobility, address neural tension   Seated swiss ball forward roll out 10 x10sec holds, diagonal stretch 5x each side  Hooklying  HS stretch with DF/PF for sciatic nerve mobility x20 bilateral;  Supine alternating marches x10 each side with TrA activation and verbal cueing for sequencing.   Supine straight leg raises x10 each side with TrA activation with tactile cueing for quad activation to keep leg straight and verbal/tactile cueing for muscle contraction/sequencing. Patient reports R side feels weaker  Seated paloff press against red TB x10 both sides  Standing paloff press against RTB x 10 each side. Verbal cueing for placement of LE's and core stability to reduce low back tension  Standing lat pull downs/shoulder extension against red TB x10; PT holding band in upward range for maximal tension for abdominal activation.    Manual  therapy:  STM and roller to lumbar and thoracic paraspinals as well as L pirfiromis , focus on L side for muscle tension, pain relief x 5 minutes to increase muscle tissue length and reduce pain.  Hamstring stretch 2x60 seconds each LE; leg on PT shoulder for end range stretch.    LE rotation for low back relaxation x 2 minutes                        PT Education - 10/14/18 1451    Education Details  exercise, core stability, muscle tissue lengthening    Person(s) Educated  Patient    Methods  Explanation;Demonstration;Tactile cues;Verbal cues    Comprehension  Verbalized understanding;Returned demonstration;Verbal cues required;Tactile cues required;Need further instruction       PT Short Term Goals - 09/20/18 1052      PT SHORT TERM GOAL #1   Title  Patient will be independent with completion of HEP to improve ability to complete functional tasks and functional ADLs.    Baseline  10/23: will give HEP next session 11/19: HEP compliance    Time  2    Period  Weeks    Status  Achieved      PT SHORT TERM GOAL #2   Title  Patient will report pain score less than 4/10 on VAS to demonstrate reduction of pain levels with functional tasks.     Baseline  10/23: 6/10 11/19: 5/10 12/12: 9/10 1/10: 7/10     Time  2    Period  Weeks    Status  Partially Met        PT Long Term Goals - 09/20/18 1052      PT LONG TERM GOAL #1   Title  Patient will report pain score of <3/10 on VAS for decreased pain levels and ease with functional activities.    Baseline  10/23: 6/10 11/19: 5/10 12/12 9/10 1/10: 7/10     Time  4    Period  Weeks    Status  Partially Met    Target Date  10/18/18      PT LONG TERM GOAL #2   Title  Patient will be able to ambulate on treadmill for 65mnutes without increase in back/leg pain demonstrating improved community ambulation and treadmill walking at pulmonary rehab    Baseline  10/23: 01mutes increases pain 11/19: 37m5mtes 12/12: has not  had the chance to perform 1/10: was able to do 5 minutes without pain    Time  4    Period  Weeks    Status  Partially Met    Target Date  10/18/18      PT LONG TERM GOAL #3   Title  Patient will have decreased modified oswestry score <50% for improved  ability to complete household tasks with less pain levels.     Baseline  10/23: 62% 11/19: 42%    Time  4    Period  Weeks    Status  Achieved      PT LONG TERM GOAL #4   Title  Patient will score 4+/5 on BLE MMT to demonstrate improved glute strength and ability to carry groceries with increased ease up/down stairs at home.    Baseline  10/23: 4-/5 grossly 11/19: 4/5 grossly; hamstrings 4-/5 12/12: 4/5 gross 1/10: 4/5 gross     Time  4    Period  Weeks    Status  Partially Met    Target Date  10/18/18      PT LONG TERM GOAL #5   Title  Patient will have decreased modified oswestry score <35% for improved ability to complete household tasks with less pain levels.     Baseline  11/19: 42% 12/12: 52% 1/10: 48%     Time  4    Period  Weeks    Status  Partially Met    Target Date  10/18/18            Plan - 10/14/18 1455    Clinical Impression Statement  Patient demonstrates improved muscle tissue lengthening status post injection last week. Continued progress with core stabilization performed with patient demonstrating good concentration and motivation. Patient will continue to benefit from skilled physical therapy to improve strength, reduce pain levels with ADLs, and improved quality of life    Rehab Potential  Fair    Clinical Impairments Affecting Rehab Potential  (+) motivation (-) chronicity of symptoms    PT Frequency  2x / week    PT Duration  4 weeks    PT Treatment/Interventions  ADLs/Self Care Home Management;Cryotherapy;Moist Heat;Traction;Iontophoresis 50m/ml Dexamethasone;Ultrasound;Gait training;Stair training;Functional mobility training;Neuromuscular re-education;Balance training;Therapeutic exercise;Therapeutic  activities;Patient/family education;Manual techniques;Energy conservation;Aquatic Therapy;Electrical Stimulation;Passive range of motion;Dry needling;Taping    PT Next Visit Plan  nerve glides, strengthening    PT Home Exercise Plan  see sheet    Consulted and Agree with Plan of Care  Patient       Patient will benefit from skilled therapeutic intervention in order to improve the following deficits and impairments:  Abnormal gait, Cardiopulmonary status limiting activity, Decreased activity tolerance, Decreased balance, Decreased endurance, Decreased range of motion, Difficulty walking, Decreased strength, Hypomobility, Decreased mobility, Impaired perceived functional ability, Impaired flexibility, Postural dysfunction, Obesity, Improper body mechanics, Pain  Visit Diagnosis: Chronic low back pain, unspecified back pain laterality, unspecified whether sciatica present  Abnormal posture  Muscle weakness (generalized)  Other abnormalities of gait and mobility     Problem List Patient Active Problem List   Diagnosis Date Noted  . DDD (degenerative disc disease), lumbosacral 10/10/2018  . Abnormal MRI, lumbar spine (08/29/2018) 10/10/2018  . Lumbar Grade 1 Anterolisthesis of L3/4 (262m & L4/5 (4-1039mw/ Dynamic instability) 10/10/2018  . 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 Lumbar foraminal stenosis (L3-4, L4-5, L5-S1) (L>R) 10/10/2018  . Lumbar nerve root impingement (Bilateral: L4 & L5) (L>R: L5) 10/10/2018  . Lumbar Spinal instability of L4-5 10/10/2018  . Lumbar facet hypertrophy 10/10/2018  . COPD (chronic obstructive pulmonary disease) with emphysema ([place]/18/2019  . Elevated rheumatoid factor 08/13/2018  . Elevated C-reactive protein (CRP) 08/12/2018  . Elevated sed rate 08/12/2018  . Lumbar facet syndrome (Bilateral) 08/12/2018  . Back pain with left-sided radiculopathy 08/12/2018  . DDD (degenerative disc disease), lumbar  08/12/2018  . Chronic lower extremity pain (Primary Area of Pain) (Bilateral) (L>R) 07/23/2018  . Chronic low back pain (Secondary Area of Pain) (Bilateral) (L>R) w/ sciatica (Bilateral) 07/23/2018  . Chronic pain syndrome 07/23/2018  . Pharmacologic therapy 07/23/2018  . Disorder of skeletal system 07/23/2018  . Problems influencing health status 07/23/2018  . Chronic sacroiliac joint pain [facility] Area of Pain) (Bilateral) (L>R) 07/23/2018  . Status post lumboperitoneal shunt placement 02/26/2018  . 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 Tympanic membrane perforation 05/30/2016  . PMB (postmenopausal bleeding) 04/26/2016  . Rash 12/27/2015  . 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 Plantar fasciitis of foot (Left) 05/26/2015  . Spondylosis of lumbar region without myelopathy or radiculopathy 05/26/2015  . Encounter for screening mammogram for malignant neoplasm of breast 12/02/2014  . Goiter 12/02/2014  . Chronic idiopathic gout of foot (Right) 07/06/2014  . Biceps tendonitis (Right) 05/19/2014  . Shoulder pain (Right) 05/19/2014  . Facial weakness 05/05/2014  . Post herpetic neuralgia 05/05/2014  . Chronic dysfunction of left eustachian tube 03/24/2014  . Osteopenia 03/13/2014  . Family history of colon cancer 12/02/2013  . Dyshidrotic eczema 11/17/2013  . Diastasis recti 04/08/2013  . Facial nerve paresis 07/31/2012  . Blepharospasm 05/08/2012  . 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 Obesity 03/28/2012  . Hemoptysis 12/22/2011  . Neuropathic postherpetic trigeminal neuralgia 12/12/2011  . Allergic rhinitis, mild 10/12/2011  . Disequilibrium syndrome 09/27/2011  . Chronic bronchitis ([place]) 06/05/2011  . Essential hypertension 05/24/2011  . Type 2 diabetes mellitus without complications ([place]) 92/33/0076  . Pulmonary nodules 05/05/2011   Janna Arch,  PT, DPT   10/14/2018, 3:17 PM  [place] MAIN [facility] SERVICES [address] [place], [place], [address] Phone: [phone]   Fax:  [phone]  Name: Adreanne Yono MRN: [id number] Date of Birth: May 06, 1951

## 2018-10-16 ENCOUNTER — Ambulatory Visit: Payer: Medicare Other

## 2018-10-16 DIAGNOSIS — G8929 Other chronic pain: Secondary | ICD-10-CM

## 2018-10-16 DIAGNOSIS — M6281 Muscle weakness (generalized): Secondary | ICD-10-CM

## 2018-10-16 DIAGNOSIS — R2689 Other abnormalities of gait and mobility: Secondary | ICD-10-CM

## 2018-10-16 DIAGNOSIS — R293 Abnormal posture: Secondary | ICD-10-CM

## 2018-10-16 DIAGNOSIS — M545 Low back pain: Principal | ICD-10-CM

## 2018-10-16 NOTE — Therapy (Signed)
[place] MAIN [facility] SERVICES [address] [place], [place], [address] Phone: [phone]   Fax:  [phone]  Physical Therapy Treatment  Patient Details  Name: Susan Callahan MRN: [id number] Date of Birth: August 16, 1951 Referring Provider (PT): Francesco Sor   Encounter Date: 10/16/2018  PT End of Session - 10/16/18 1457    Visit Number  21    Number of Visits  22    Date for PT Re-Evaluation  10/18/18    Authorization Type  2/10 starting 1/29    PT Start Time  1431    PT Stop Time  1515    PT Time Calculation (min)  44 min    Activity Tolerance  Patient tolerated treatment well;Patient limited by pain    Behavior During Therapy  [facility] for tasks assessed/performed       Past Medical History:  Diagnosis Date  . COPD (chronic obstructive pulmonary disease) ([place])   . Depression 1974  . Diabetes mellitus without complication ([place])   . Hypertension   . Migraine   . 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 LUMBAR PERITONEAL SHUNT    . TYMPANOSTOMY TUBE PLACEMENT      There were no vitals filed for this visit.  Subjective Assessment - 10/16/18 1438    Subjective  Patient waited 2 hours for the dermatologist who never came to see her so she had to leave. Is upset and fatigued with increased back pain.     Pertinent History  Pain began in 2007 and has gradually got worse and has progressed to nonstop. Patient states pain last all day long, everyday with no change in symptoms. Medication helps briefly. States she has difficulty with showering and standing. States she has to stand with her knee bent due to pulling sensation in legs. Pain begins immediately when stands and begins to walk. Pain feels better when she bends her knees when standing. Pain became constant and non stop around Dec 2018. Denies bowel or bladder changes, unexplained weakness, or unexpected weight loss/gain. She  currently goes to pulmonary rehab 3x a week, but has increased pain with treadmill walking. Will walk with cane on occasion around house especially at night due to occasional dizziness and vision impairments in medical history. States she also has difficulty with carrying groceries up steps due to low back pain. She would like to be able to walk better and have less pain to be less reliant on medication.     Limitations  Lifting;Standing;Walking;House hold activities    How long can you stand comfortably?  pain begins immediately     How long can you walk comfortably?  pain begins immediately     Patient Stated Goals  walk better, have less pain    Currently in Pain?  Yes    Pain Score  5     Pain Location  Back    Pain Orientation  Lower    Pain Descriptors / Indicators  Aching    Pain Type  Chronic pain    Pain Onset  More than a month ago    Pain Frequency  Intermittent         TREATMENT:  Due to patient's increased pain and frustration today's focus is on pain reduction and core stabilization.       Manual therapy: STM and roller tolumbar and thoracic paraspinals as well as L pirfiromis , focus on L sidefor muscle  tension, pain reliefx27mnutesto increase muscle tissue length and reduce pain. Distraction technique to paraspinal musculature of thoracic and upper lumbar spine for pain relief 6x 15 second holds Hamstring stretch 2x60 seconds each LE; leg on PT shoulder for end range stretch.   LE rotation for low back relaxation x 2 minutes    Therapeutic exercises:performed with verbal/visual cues, CGA and task orientation. Goal: improve mobility, address neural tension   Seated swiss ball forward roll out 10 x10sec holds, diagonal stretch 5x each side   Seated paloff press against red TB x20 both sides   seated lat pull downs/shoulder extension against red TB x20; PT holding band in upward range for maximal tension for abdominal activation.           PT  Education - 10/16/18 1456    Education Details  exercise, core stability, muscle tissue lengthening    Person(s) Educated  Patient    Methods  Explanation;Demonstration;Tactile cues;Verbal cues    Comprehension  Verbalized understanding;Returned demonstration;Verbal cues required;Tactile cues required;Need further instruction       PT Short Term Goals - 09/20/18 1052      PT SHORT TERM GOAL #1   Title  Patient will be independent with completion of HEP to improve ability to complete functional tasks and functional ADLs.    Baseline  10/23: will give HEP next session 11/19: HEP compliance    Time  2    Period  Weeks    Status  Achieved      PT SHORT TERM GOAL #2   Title  Patient will report pain score less than 4/10 on VAS to demonstrate reduction of pain levels with functional tasks.     Baseline  10/23: 6/10 11/19: 5/10 12/12: 9/10 1/10: 7/10     Time  2    Period  Weeks    Status  Partially Met        PT Long Term Goals - 09/20/18 1052      PT LONG TERM GOAL #1   Title  Patient will report pain score of <3/10 on VAS for decreased pain levels and ease with functional activities.    Baseline  10/23: 6/10 11/19: 5/10 12/12 9/10 1/10: 7/10     Time  4    Period  Weeks    Status  Partially Met    Target Date  10/18/18      PT LONG TERM GOAL #2   Title  Patient will be able to ambulate on treadmill for 128mutes without increase in back/leg pain demonstrating improved community ambulation and treadmill walking at pulmonary rehab    Baseline  10/23: 47m947mtes increases pain 11/19: 8mi9mes 12/12: has not had the chance to perform 1/10: was able to do 5 minutes without pain    Time  4    Period  Weeks    Status  Partially Met    Target Date  10/18/18      PT LONG TERM GOAL #3   Title  Patient will have decreased modified oswestry score <50% for improved ability to complete household tasks with less pain levels.     Baseline  10/23: 62% 11/19: 42%    Time  4    Period  Weeks     Status  Achieved      PT LONG TERM GOAL #4   Title  Patient will score 4+/5 on BLE MMT to demonstrate improved glute strength and ability to carry groceries with increased ease up/down stairs at  home.    Baseline  10/23: 4-/5 grossly 11/19: 4/5 grossly; hamstrings 4-/5 12/12: 4/5 gross 1/10: 4/5 gross     Time  4    Period  Weeks    Status  Partially Met    Target Date  10/18/18      PT LONG TERM GOAL #5   Title  Patient will have decreased modified oswestry score <35% for improved ability to complete household tasks with less pain levels.     Baseline  11/19: 42% 12/12: 52% 1/10: 48%     Time  4    Period  Weeks    Status  Partially Met    Target Date  10/18/18            Plan - 10/16/18 1504    Clinical Impression Statement  Patient has higher pain levels today from high stress and frustration waiting from dermatology. Patient has reduced pain by end of session with improved muscle tissue lengthening and core stability. She is able to exhibit better core stabilization with verbal cues. Patient will continue to benefit from skilled physical therapy to improve strength, reduce pain levels with ADLs, and improved quality of life    Rehab Potential  Fair    Clinical Impairments Affecting Rehab Potential  (+) motivation (-) chronicity of symptoms    PT Frequency  2x / week    PT Duration  4 weeks    PT Treatment/Interventions  ADLs/Self Care Home Management;Cryotherapy;Moist Heat;Traction;Iontophoresis 26m/ml Dexamethasone;Ultrasound;Gait training;Stair training;Functional mobility training;Neuromuscular re-education;Balance training;Therapeutic exercise;Therapeutic activities;Patient/family education;Manual techniques;Energy conservation;Aquatic Therapy;Electrical Stimulation;Passive range of motion;Dry needling;Taping    PT Next Visit Plan  recert, goals     PT Home Exercise Plan  see sheet    Consulted and Agree with Plan of Care  Patient       Patient will benefit from  skilled therapeutic intervention in order to improve the following deficits and impairments:  Abnormal gait, Cardiopulmonary status limiting activity, Decreased activity tolerance, Decreased balance, Decreased endurance, Decreased range of motion, Difficulty walking, Decreased strength, Hypomobility, Decreased mobility, Impaired perceived functional ability, Impaired flexibility, Postural dysfunction, Obesity, Improper body mechanics, Pain  Visit Diagnosis: Chronic low back pain, unspecified back pain laterality, unspecified whether sciatica present  Abnormal posture  Muscle weakness (generalized)  Other abnormalities of gait and mobility     Problem List Patient Active Problem List   Diagnosis Date Noted  . DDD (degenerative disc disease), lumbosacral 10/10/2018  . Abnormal MRI, lumbar spine (08/29/2018) 10/10/2018  . Lumbar Grade 1 Anterolisthesis of L3/4 (225m & L4/5 (4-1048mw/ Dynamic instability) 10/10/2018  . 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 Lumbar foraminal stenosis (L3-4, L4-5, L5-S1) (L>R) 10/10/2018  . Lumbar nerve root impingement (Bilateral: L4 & L5) (L>R: L5) 10/10/2018  . Lumbar Spinal instability of L4-5 10/10/2018  . Lumbar facet hypertrophy 10/10/2018  . COPD (chronic obstructive pulmonary disease) with emphysema ([place]/18/2019  . Elevated rheumatoid factor 08/13/2018  . Elevated C-reactive protein (CRP) 08/12/2018  . Elevated sed rate 08/12/2018  . Lumbar facet syndrome (Bilateral) 08/12/2018  . Back pain with left-sided radiculopathy 08/12/2018  . DDD (degenerative disc disease), lumbar 08/12/2018  . Chronic lower extremity pain (Primary Area of Pain) (Bilateral) (L>R) 07/23/2018  . Chronic low back pain (Secondary Area of Pain) (Bilateral) (L>R) w/ sciatica (Bilateral) 07/23/2018  . Chronic pain syndrome 07/23/2018  . Pharmacologic therapy 07/23/2018  . Disorder of skeletal system 07/23/2018  . Problems  influencing health status 07/23/2018  .  Chronic sacroiliac joint pain [facility] Area of Pain) (Bilateral) (L>R) 07/23/2018  . Status post lumboperitoneal shunt placement 02/26/2018  . 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 Tympanic membrane perforation 05/30/2016  . PMB (postmenopausal bleeding) 04/26/2016  . Rash 12/27/2015  . 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 Plantar fasciitis of foot (Left) 05/26/2015  . Spondylosis of lumbar region without myelopathy or radiculopathy 05/26/2015  . Encounter for screening mammogram for malignant neoplasm of breast 12/02/2014  . Goiter 12/02/2014  . Chronic idiopathic gout of foot (Right) 07/06/2014  . Biceps tendonitis (Right) 05/19/2014  . Shoulder pain (Right) 05/19/2014  . Facial weakness 05/05/2014  . Post herpetic neuralgia 05/05/2014  . Chronic dysfunction of left eustachian tube 03/24/2014  . Osteopenia 03/13/2014  . Family history of colon cancer 12/02/2013  . Dyshidrotic eczema 11/17/2013  . Diastasis recti 04/08/2013  . Facial nerve paresis 07/31/2012  . Blepharospasm 05/08/2012  . 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 Obesity 03/28/2012  . Hemoptysis 12/22/2011  . Neuropathic postherpetic trigeminal neuralgia 12/12/2011  . Allergic rhinitis, mild 10/12/2011  . Disequilibrium syndrome 09/27/2011  . Chronic bronchitis ([place]) 06/05/2011  . Essential hypertension 05/24/2011  . Type 2 diabetes mellitus without complications ([place]) 69/40/9828  . Pulmonary nodules 05/05/2011   Janna Arch, PT, DPT   10/16/2018, 3:15 PM  [place] MAIN [facility] SERVICES [address] [place], [place], [address] Phone: [phone]   Fax:  [phone]  Name: Susan Callahan MRN: [id number] Date of Birth: 03-19-1951

## 2018-10-22 ENCOUNTER — Ambulatory Visit: Payer: Medicare Other

## 2018-10-22 DIAGNOSIS — M545 Low back pain: Secondary | ICD-10-CM | POA: Diagnosis not present

## 2018-10-22 DIAGNOSIS — R293 Abnormal posture: Secondary | ICD-10-CM

## 2018-10-22 DIAGNOSIS — G8929 Other chronic pain: Secondary | ICD-10-CM

## 2018-10-22 DIAGNOSIS — M6281 Muscle weakness (generalized): Secondary | ICD-10-CM

## 2018-10-22 NOTE — Therapy (Signed)
[place] MAIN [facility] SERVICES [address] [place], [place], [address] Phone: [phone]   Fax:  [phone]  Physical Therapy Treatment/ RECERT  Patient Details  Name: Susan Callahan MRN: [id number] Date of Birth: Dec 04, 1950 Referring Provider (PT): Francesco Sor   Encounter Date: 10/22/2018  PT End of Session - 10/22/18 1615    Visit Number  22    Number of Visits  30    Date for PT Re-Evaluation  11/19/18    Authorization Type  3/10 starting 1/29    PT Start Time  1600    PT Stop Time  1645    PT Time Calculation (min)  45 min    Activity Tolerance  Patient tolerated treatment well;Patient limited by pain    Behavior During Therapy  [facility] for tasks assessed/performed       Past Medical History:  Diagnosis Date  . COPD (chronic obstructive pulmonary disease) ([place])   . Depression 1974  . Diabetes mellitus without complication ([place])   . Hypertension   . Migraine   . 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 LUMBAR PERITONEAL SHUNT    . TYMPANOSTOMY TUBE PLACEMENT      There were no vitals filed for this visit.  Subjective Assessment - 10/22/18 1614    Subjective  Patient went to the dump, bent knees and did the biomechanics we practiced with no pain. Did rototill in back yard and planted and was conscious of bending knees and squat to plant peas.  A little residual pain now and there today. This morning left leg didn't want to let her point her toes, cramping.     Pertinent History  Pain began in 2007 and has gradually got worse and has progressed to nonstop. Patient states pain last all day long, everyday with no change in symptoms. Medication helps briefly. States she has difficulty with showering and standing. States she has to stand with her knee bent due to pulling sensation in legs. Pain begins immediately when stands and begins to walk. Pain feels better when she bends  her knees when standing. Pain became constant and non stop around Dec 2018. Denies bowel or bladder changes, unexplained weakness, or unexpected weight loss/gain. She currently goes to pulmonary rehab 3x a week, but has increased pain with treadmill walking. Will walk with cane on occasion around house especially at night due to occasional dizziness and vision impairments in medical history. States she also has difficulty with carrying groceries up steps due to low back pain. She would like to be able to walk better and have less pain to be less reliant on medication.     Limitations  Lifting;Standing;Walking;House hold activities    How long can you stand comfortably?  pain begins immediately     How long can you walk comfortably?  pain begins immediately     Patient Stated Goals  walk better, have less pain    Currently in Pain?  Yes    Pain Score  1     Pain Location  Back    Pain Orientation  Lower    Pain Descriptors / Indicators  Aching    Pain Type  Chronic pain    Pain Onset  More than a month ago    Pain Frequency  Intermittent      GOALS VAS: 3/10 LE strength: 4+/5 gross with hamstring and abductors 4-/5 MODI: 30%  walk  5 minutes on treadmill prior to having to stop and arch over.   making and stripping the bed is still challenging for patient at this time.     Treatment:  Hooklying piliates pulses/pregnancy 30 seconds x2 trials   Standing: RTB woodchop cross body pattern. Verbal cueing for keeping abdominal right. -not feeling in core so transitioned to sitting position which activated core. 10x each UE   Supine nerve glides on PT shoulder df/pf 20x each LE,  Supine calf stretch each LE 60 seconds. x3 trials.    Seated hamstring curl RTB 2x10 each LE,upright posture with slow controlled eccentric and concentric motions with no episodes of hamstring cramping.    LE rotation for low back pain reduction 2 minutes  Core activation in seated position 2x 30 seconds                     PT Education - 10/22/18 1614    Education Details  goals, POC, exercise technique     Person(s) Educated  Patient    Methods  Explanation;Demonstration;Tactile cues;Verbal cues    Comprehension  Verbalized understanding;Returned demonstration;Verbal cues required;Tactile cues required       PT Short Term Goals - 10/22/18 1616      PT SHORT TERM GOAL #1   Title  Patient will be independent with completion of HEP to improve ability to complete functional tasks and functional ADLs.    Baseline  10/23: will give HEP next session 11/19: HEP compliance    Time  2    Period  Weeks    Status  Achieved      PT SHORT TERM GOAL #2   Title  Patient will report pain score less than 4/10 on VAS to demonstrate reduction of pain levels with functional tasks.     Baseline  10/23: 6/10 11/19: 5/10 12/12: 9/10 1/10: 7/10  2/11: 3/10     Time  2    Period  Weeks    Status  Achieved        PT Long Term Goals - 10/22/18 1616      PT LONG TERM GOAL #1   Title  Patient will report pain score of <3/10 on VAS for decreased pain levels and ease with functional activities.    Baseline  10/23: 6/10 11/19: 5/10 12/12 9/10 1/10: 7/10 2/11: 3/10     Time  4    Period  Weeks    Status  Partially Met    Target Date  11/19/18      PT LONG TERM GOAL #2   Title  Patient will be able to ambulate on treadmill for 61mnutes without increase in back/leg pain demonstrating improved community ambulation and treadmill walking at pulmonary rehab    Baseline  10/23: 022mutes increases pain 11/19: 78m23mtes 12/12: has not had the chance to perform 1/10: was able to do 5 minutes without pain 2/11: able to do 5 minutes then had to bend over    Time  4    Period  Weeks    Status  Partially Met    Target Date  11/19/18      PT LONG TERM GOAL #3   Title  Patient will have decreased modified oswestry score <50% for improved ability to complete household tasks with less pain levels.      Baseline  10/23: 62% 11/19: 42%    Time  4    Period  Weeks    Status  Achieved  PT LONG TERM GOAL #4   Title  Patient will score 4+/5 on BLE MMT to demonstrate improved glute strength and ability to carry groceries with increased ease up/down stairs at home.    Baseline  10/23: 4-/5 grossly 11/19: 4/5 grossly; hamstrings 4-/5 12/12: 4/5 gross 1/10: 4/5 gross 2/11: gross 4+/5 with hamstrings and abuductors 4-/5    Time  4    Period  Weeks    Status  Partially Met    Target Date  11/19/18      PT LONG TERM GOAL #5   Title  Patient will have decreased modified oswestry score <35% for improved ability to complete household tasks with less pain levels.     Baseline  11/19: 42% 12/12: 52% 1/10: 48%  2/11: 30%     Time  4    Period  Weeks    Status  Achieved      Additional Long Term Goals   Additional Long Term Goals  Yes      PT LONG TERM GOAL #6   Title  Patient will be able to make and/or strip the bed without pain and without needing rest breaks to perform iADLs independently.     Baseline  2/11: pain limits ability to perform, ~4 breaks required    Time  4    Period  Weeks    Status  New    Target Date  11/19/18      PT LONG TERM GOAL #7   Title  Patient will have decreased modified oswestry score <20% for improved ability to complete household tasks with less pain levels.     Baseline  2/11: 30%     Time  4    Period  Weeks    Status  New    Target Date  11/19/18            Plan - 10/22/18 1709    Clinical Impression Statement  Patient demonstrates significant progress towards goals at this time with MODI decreasing to 30% with LE strength improving with exception of hamstring and abductors which occasionally spasm with interventions resulting in weaker strength. Pain is improving allowing more mobility with ADLs and iADLs as well as with exercise class. Patient continues to be limited with some aspects of iADLs such as making her bed and prolonged ambulation.   Patient's condition has the potential to improve in response to therapy. Maximum improvement is yet to be obtained. The anticipated improvement is attainable and reasonable in a generally predictable time.Patient will continue to benefit from skilled physical therapy to improve strength, reduce pain levels with ADLs, and improved quality of life    Rehab Potential  Fair    Clinical Impairments Affecting Rehab Potential  (+) motivation (-) chronicity of symptoms    PT Frequency  2x / week    PT Duration  4 weeks    PT Treatment/Interventions  ADLs/Self Care Home Management;Cryotherapy;Moist Heat;Traction;Iontophoresis 64m/ml Dexamethasone;Ultrasound;Gait training;Stair training;Functional mobility training;Neuromuscular re-education;Balance training;Therapeutic exercise;Therapeutic activities;Patient/family education;Manual techniques;Energy conservation;Aquatic Therapy;Electrical Stimulation;Passive range of motion;Dry needling;Taping    PT Next Visit Plan  new core HEP     PT Home Exercise Plan  see sheet    Consulted and Agree with Plan of Care  Patient       Patient will benefit from skilled therapeutic intervention in order to improve the following deficits and impairments:  Abnormal gait, Cardiopulmonary status limiting activity, Decreased activity tolerance, Decreased balance, Decreased endurance, Decreased range of motion, Difficulty walking, Decreased  strength, Hypomobility, Decreased mobility, Impaired perceived functional ability, Impaired flexibility, Postural dysfunction, Obesity, Improper body mechanics, Pain  Visit Diagnosis: Chronic low back pain, unspecified back pain laterality, unspecified whether sciatica present  Abnormal posture  Muscle weakness (generalized)     Problem List Patient Active Problem List   Diagnosis Date Noted  . DDD (degenerative disc disease), lumbosacral 10/10/2018  . Abnormal MRI, lumbar spine (08/29/2018) 10/10/2018  . Lumbar Grade 1  Anterolisthesis of L3/4 (51m) & L4/5 (4-126m(w/ Dynamic instability) 10/10/2018  . 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 Lumbar foraminal stenosis (L3-4, L4-5, L5-S1) (L>R) 10/10/2018  . Lumbar nerve root impingement (Bilateral: L4 & L5) (L>R: L5) 10/10/2018  . Lumbar Spinal instability of L4-5 10/10/2018  . Lumbar facet hypertrophy 10/10/2018  . COPD (chronic obstructive pulmonary disease) with emphysema ([facility]12/18/2019  . Elevated rheumatoid factor 08/13/2018  . Elevated C-reactive protein (CRP) 08/12/2018  . Elevated sed rate 08/12/2018  . Lumbar facet syndrome (Bilateral) 08/12/2018  . Back pain with left-sided radiculopathy 08/12/2018  . DDD (degenerative disc disease), lumbar 08/12/2018  . Chronic lower extremity pain (Primary Area of Pain) (Bilateral) (L>R) 07/23/2018  . Chronic low back pain (Secondary Area of Pain) (Bilateral) (L>R) w/ sciatica (Bilateral) 07/23/2018  . Chronic pain syndrome 07/23/2018  . Pharmacologic therapy 07/23/2018  . Disorder of skeletal system 07/23/2018  . Problems influencing health status 07/23/2018  . Chronic sacroiliac joint pain ([facility]) (Bilateral) (L>R) 07/23/2018  . Status post lumboperitoneal shunt placement 02/26/2018  . 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 Tympanic membrane perforation 05/30/2016  . PMB (postmenopausal bleeding) 04/26/2016  . Rash 12/27/2015  . 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 Plantar fasciitis of foot (Left) 05/26/2015  . Spondylosis of lumbar region without myelopathy or radiculopathy 05/26/2015  . Encounter for screening mammogram for malignant neoplasm of breast 12/02/2014  . Goiter 12/02/2014  . Chronic idiopathic gout of foot (Right) 07/06/2014  . Biceps tendonitis (Right) 05/19/2014  . Shoulder pain (Right) 05/19/2014  . Facial weakness 05/05/2014  . Post herpetic  neuralgia 05/05/2014  . Chronic dysfunction of left eustachian tube 03/24/2014  . Osteopenia 03/13/2014  . Family history of colon cancer 12/02/2013  . Dyshidrotic eczema 11/17/2013  . Diastasis recti 04/08/2013  . Facial nerve paresis 07/31/2012  . Blepharospasm 05/08/2012  . 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 Obesity 03/28/2012  . Hemoptysis 12/22/2011  . Neuropathic postherpetic trigeminal neuralgia 12/12/2011  . Allergic rhinitis, mild 10/12/2011  . Disequilibrium syndrome 09/27/2011  . Chronic bronchitis (HC[facility] [place]09/24/2012  . Essential hypertension 05/24/2011  . Type 2 diabetes mellitus without complications ([id number]. Pulmonary nodules 05/05/2011   MaJanna ArchPT, DPT   10/22/2018, 5:10 PM  CoHartrandtAIN RELaser Surgery CtrERVICES [address]North [place]NC[place]hone: [phone] Fax:  [phone]: LiKamea DacostaRN: [id number] of Birth: 6/August 18, 1951

## 2018-10-24 NOTE — Progress Notes (Signed)
The patient indicated that she could not wait any longer because she had another appointment and she left.

## 2018-10-28 ENCOUNTER — Ambulatory Visit: Payer: Medicare Other | Attending: Pain Medicine | Admitting: Pain Medicine

## 2018-10-28 DIAGNOSIS — M5442 Lumbago with sciatica, left side: Secondary | ICD-10-CM

## 2018-10-28 DIAGNOSIS — M79605 Pain in left leg: Secondary | ICD-10-CM

## 2018-10-28 DIAGNOSIS — M5441 Lumbago with sciatica, right side: Secondary | ICD-10-CM

## 2018-10-28 DIAGNOSIS — M79604 Pain in right leg: Secondary | ICD-10-CM

## 2018-10-28 DIAGNOSIS — M533 Sacrococcygeal disorders, not elsewhere classified: Secondary | ICD-10-CM

## 2018-10-28 DIAGNOSIS — G8929 Other chronic pain: Secondary | ICD-10-CM

## 2018-10-29 ENCOUNTER — Ambulatory Visit: Payer: Medicare Other

## 2018-10-29 DIAGNOSIS — M545 Low back pain: Secondary | ICD-10-CM | POA: Diagnosis not present

## 2018-10-29 DIAGNOSIS — G8929 Other chronic pain: Secondary | ICD-10-CM

## 2018-10-29 DIAGNOSIS — R293 Abnormal posture: Secondary | ICD-10-CM

## 2018-10-29 DIAGNOSIS — M6281 Muscle weakness (generalized): Secondary | ICD-10-CM

## 2018-10-29 DIAGNOSIS — R2689 Other abnormalities of gait and mobility: Secondary | ICD-10-CM

## 2018-10-29 NOTE — Patient Instructions (Signed)
Access Code: [email or web address]  URL: [email or web address]  Date: 10/23/2018  Prepared by: Janna Arch   Exercises  Supine Transversus Abdominis Bracing - Hands on Stomach - 10 reps - 2 sets - 5 hold - 1x daily - 7x weekly  Supine 90/90 Alternating Toe Touch - 10 reps - 2 sets - 5 hold - 1x daily - 7x weekly  Supine 90/90 Abdominal Bracing - 10 reps - 2 sets - 5 hold - 1x daily - 7x weekly  Supine Active Straight Leg Raise - 10 reps - 2 sets - 5 hold - 1x daily - 7x weekly  Clamshell with Resistance - 10 reps - 2 sets - 5 hold - 1x daily - 7x weekly  Standing Shoulder Scaption - 10 reps - 2 sets - 5 hold - 1x daily - 7x weekly  Scaption with Dumbbells - 10 reps - 2 sets - 5 hold - 1x daily - 7x weekly  Standing Shoulder Flexion with Resistance - 10 reps - 2 sets - 5 hold - 1x daily - 7x weekly  Shoulder extension with resistance - Neutral - 10 reps - 2 sets - 5 hold - 1x daily - 7x weekly  Squatting Anti-Rotation Press - 10 reps - 2 sets - 5 hold - 1x daily - 7x weekly  Seated Diagonal Lift with Medicine Ball - 10 reps - 2 sets - 5 hold - 1x daily - 7x weekly  Bicep Curls on Swiss Ball - 10 reps - 2 sets - 5 hold - 1x daily - 7x weekly  Shoulder Press on [employer] - 10 reps - 2 sets - 5 hold - 1x daily - 7x weekly  Farmer's Carry with Kettlebells - 10 reps - 2 sets - 5 hold - 1x daily - 7x weekly

## 2018-10-29 NOTE — Therapy (Signed)
[place] MAIN [facility] SERVICES [address] [place], [place], [address] Phone: [phone]   Fax:  [phone]  Physical Therapy Treatment  Patient Details  Name: Susan Callahan MRN: [id number] Date of Birth: 1951/03/02 Referring Provider (PT): Francesco Sor   Encounter Date: 10/29/2018  PT End of Session - 10/29/18 0819    Visit Number  23    Number of Visits  30    Date for PT Re-Evaluation  11/19/18    Authorization Type  4/10 starting 1/29    PT Start Time  0810    PT Stop Time  0845    PT Time Calculation (min)  35 min    Activity Tolerance  Patient tolerated treatment well    Behavior During Therapy  [facility] for tasks assessed/performed       Past Medical History:  Diagnosis Date  . COPD (chronic obstructive pulmonary disease) ([place])   . Depression 1974  . Diabetes mellitus without complication ([place])   . Hypertension   . Migraine   . 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 LUMBAR PERITONEAL SHUNT    . TYMPANOSTOMY TUBE PLACEMENT      There were no vitals filed for this visit.  Subjective Assessment - 10/29/18 0815    Subjective  Patient arrived late to session limiting duration of session. Has multiple doctor appointments today after her session today.     Pertinent History  Pain began in 2007 and has gradually got worse and has progressed to nonstop. Patient states pain last all day long, everyday with no change in symptoms. Medication helps briefly. States she has difficulty with showering and standing. States she has to stand with her knee bent due to pulling sensation in legs. Pain begins immediately when stands and begins to walk. Pain feels better when she bends her knees when standing. Pain became constant and non stop around Dec 2018. Denies bowel or bladder changes, unexplained weakness, or unexpected weight loss/gain. She currently goes to pulmonary rehab 3x a  week, but has increased pain with treadmill walking. Will walk with cane on occasion around house especially at night due to occasional dizziness and vision impairments in medical history. States she also has difficulty with carrying groceries up steps due to low back pain. She would like to be able to walk better and have less pain to be less reliant on medication.     Limitations  Lifting;Standing;Walking;House hold activities    How long can you stand comfortably?  pain begins immediately     How long can you walk comfortably?  pain begins immediately     Patient Stated Goals  walk better, have less pain    Currently in Pain?  Yes    Pain Score  2     Pain Location  Back    Pain Orientation  Lower    Pain Descriptors / Indicators  Aching    Pain Type  Chronic pain    Pain Onset  More than a month ago    Pain Frequency  Intermittent       HEP GIVEN: verbal and tactile cueing for abdominal activation and decreased compensatory compensation.    Access Code: [email or web address]  URL: [email or web address]  Date: 10/23/2018  Prepared by: Janna Arch   Exercises  Supine Transversus Abdominis Bracing - Hands on Stomach - 10 reps - 2 sets - 5 hold - 1x daily - 7x weekly  Supine 90/90 Alternating Toe Touch - 10 reps - 2 sets - 5 hold - 1x daily - 7x weekly . One leg at a time due to compensatory back recruitment.  Supine 90/90 Abdominal Bracing - 10 reps - 2 sets - 5 hold - 1x daily - 7x weekly  Supine Active Straight Leg Raise - 10 reps - 2 sets - 5 hold - 1x daily - 7x weekly  Clamshell with Resistance - 10 reps - 2 sets - 5 hold - 1x daily - 7x weekly  Standing Shoulder Scaption - 10 reps - 2 sets - 5 hold - 1x daily - 7x weekly (start in sitting first to ensure back stability) Scaption with Dumbbells - 10 reps - 2 sets - 5 hold - 1x daily - 7x weekly  Standing Shoulder Flexion with Resistance - 10 reps - 2 sets - 5 hold - 1x daily - 7x weekly  Shoulder extension with resistance -  Neutral - 10 reps - 2 sets - 5 hold - 1x daily - 7x weekly  Squatting Anti-Rotation Press - 10 reps - 2 sets - 5 hold - 1x daily - 7x weekly  Seated Diagonal Lift with Medicine Ball - 10 reps - 2 sets - 5 hold - 1x daily - 7x weekly  Bicep Curls on Swiss Ball - 10 reps - 2 sets - 5 hold - 1x daily - 7x weekly  Shoulder Press on [employer] - 10 reps - 2 sets - 5 hold - 1x daily - 7x weekly  Farmer's Carry with Kettlebells - 10 reps - 2 sets - 5 hold - 1x daily - 7x weekly   Verbal cues for breathing out" during hard" and in "during easy" portion of intervention. All above exercises performed for one set.   Patient requires tactile cueing for correct body position and alignment for improved muscle recruitment patterning.                      PT Education - 10/29/18 0818    Education Details  HEP for home abdominal activation    Person(s) Educated  Patient    Methods  Explanation;Demonstration;Tactile cues;Verbal cues;Handout    Comprehension  Verbalized understanding;Returned demonstration;Verbal cues required;Tactile cues required;Need further instruction       PT Short Term Goals - 10/22/18 1616      PT SHORT TERM GOAL #1   Title  Patient will be independent with completion of HEP to improve ability to complete functional tasks and functional ADLs.    Baseline  10/23: will give HEP next session 11/19: HEP compliance    Time  2    Period  Weeks    Status  Achieved      PT SHORT TERM GOAL #2   Title  Patient will report pain score less than 4/10 on VAS to demonstrate reduction of pain levels with functional tasks.     Baseline  10/23: 6/10 11/19: 5/10 12/12: 9/10 1/10: 7/10  2/11: 3/10     Time  2    Period  Weeks    Status  Achieved        PT Long Term Goals - 10/22/18 1616      PT LONG TERM GOAL #1   Title  Patient will report pain score of <3/10 on VAS for decreased pain levels and ease with functional activities.    Baseline  10/23: 6/10 11/19: 5/10  12/12 9/10 1/10: 7/10 2/11: 3/10  Time  4    Period  Weeks    Status  Partially Met    Target Date  11/19/18      PT LONG TERM GOAL #2   Title  Patient will be able to ambulate on treadmill for 125mnutes without increase in back/leg pain demonstrating improved community ambulation and treadmill walking at pulmonary rehab    Baseline  10/23: 023mutes increases pain 11/19: 25m15mtes 12/12: has not had the chance to perform 1/10: was able to do 5 minutes without pain 2/11: able to do 5 minutes then had to bend over    Time  4    Period  Weeks    Status  Partially Met    Target Date  11/19/18      PT LONG TERM GOAL #3   Title  Patient will have decreased modified oswestry score <50% for improved ability to complete household tasks with less pain levels.     Baseline  10/23: 62% 11/19: 42%    Time  4    Period  Weeks    Status  Achieved      PT LONG TERM GOAL #4   Title  Patient will score 4+/5 on BLE MMT to demonstrate improved glute strength and ability to carry groceries with increased ease up/down stairs at home.    Baseline  10/23: 4-/5 grossly 11/19: 4/5 grossly; hamstrings 4-/5 12/12: 4/5 gross 1/10: 4/5 gross 2/11: gross 4+/5 with hamstrings and abuductors 4-/5    Time  4    Period  Weeks    Status  Partially Met    Target Date  11/19/18      PT LONG TERM GOAL #5   Title  Patient will have decreased modified oswestry score <35% for improved ability to complete household tasks with less pain levels.     Baseline  11/19: 42% 12/12: 52% 1/10: 48%  2/11: 30%     Time  4    Period  Weeks    Status  Achieved      Additional Long Term Goals   Additional Long Term Goals  Yes      PT LONG TERM GOAL #6   Title  Patient will be able to make and/or strip the bed without pain and without needing rest breaks to perform iADLs independently.     Baseline  2/11: pain limits ability to perform, ~4 breaks required    Time  4    Period  Weeks    Status  New    Target Date  11/19/18       PT LONG TERM GOAL #7   Title  Patient will have decreased modified oswestry score <20% for improved ability to complete household tasks with less pain levels.     Baseline  2/11: 30%     Time  4    Period  Weeks    Status  New    Target Date  11/19/18            Plan - 10/29/18 0827408 Clinical Impression Statement  Patient's session limited by late arrival. HEP interventions performed with patient demonstrating understanding. Interventions performed with progressive difficulty. She is able to exhibit better core stabilization with verbal cues. Patient will continue to benefit from skilled physical therapy to improve strength, reduce pain levels with ADLs, and improved quality of life    Rehab Potential  Fair    Clinical Impairments Affecting Rehab Potential  (+) motivation (-) chronicity of  symptoms    PT Frequency  2x / week    PT Duration  4 weeks    PT Treatment/Interventions  ADLs/Self Care Home Management;Cryotherapy;Moist Heat;Traction;Iontophoresis 17m/ml Dexamethasone;Ultrasound;Gait training;Stair training;Functional mobility training;Neuromuscular re-education;Balance training;Therapeutic exercise;Therapeutic activities;Patient/family education;Manual techniques;Energy conservation;Aquatic Therapy;Electrical Stimulation;Passive range of motion;Dry needling;Taping    PT Next Visit Plan  new core HEP     PT Home Exercise Plan  see sheet    Consulted and Agree with Plan of Care  Patient       Patient will benefit from skilled therapeutic intervention in order to improve the following deficits and impairments:  Abnormal gait, Cardiopulmonary status limiting activity, Decreased activity tolerance, Decreased balance, Decreased endurance, Decreased range of motion, Difficulty walking, Decreased strength, Hypomobility, Decreased mobility, Impaired perceived functional ability, Impaired flexibility, Postural dysfunction, Obesity, Improper body mechanics, Pain  Visit  Diagnosis: Chronic low back pain, unspecified back pain laterality, unspecified whether sciatica present  Abnormal posture  Muscle weakness (generalized)  Other abnormalities of gait and mobility     Problem List Patient Active Problem List   Diagnosis Date Noted  . DDD (degenerative disc disease), lumbosacral 10/10/2018  . Abnormal MRI, lumbar spine (08/29/2018) 10/10/2018  . Lumbar Grade 1 Anterolisthesis of L3/4 (236m & L4/5 (4-1022mw/ Dynamic instability) 10/10/2018  . 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 Lumbar foraminal stenosis (L3-4, L4-5, L5-S1) (L>R) 10/10/2018  . Lumbar nerve root impingement (Bilateral: L4 & L5) (L>R: L5) 10/10/2018  . Lumbar Spinal instability of L4-5 10/10/2018  . Lumbar facet hypertrophy 10/10/2018  . COPD (chronic obstructive pulmonary disease) with emphysema ([place]2/18/2019  . Elevated rheumatoid factor 08/13/2018  . Elevated C-reactive protein (CRP) 08/12/2018  . Elevated sed rate 08/12/2018  . Lumbar facet syndrome (Bilateral) 08/12/2018  . Back pain with left-sided radiculopathy 08/12/2018  . DDD (degenerative disc disease), lumbar 08/12/2018  . Chronic lower extremity pain (Primary Area of Pain) (Bilateral) (L>R) 07/23/2018  . Chronic low back pain (Secondary Area of Pain) (Bilateral) (L>R) w/ sciatica (Bilateral) 07/23/2018  . Chronic pain syndrome 07/23/2018  . Pharmacologic therapy 07/23/2018  . Disorder of skeletal system 07/23/2018  . Problems influencing health status 07/23/2018  . Chronic sacroiliac joint pain ([facility]) (Bilateral) (L>R) 07/23/2018  . Status post lumboperitoneal shunt placement 02/26/2018  . 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 Tympanic membrane perforation 05/30/2016  . PMB (postmenopausal bleeding) 04/26/2016  . Rash 12/27/2015  . 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  Plantar fasciitis of foot (Left) 05/26/2015  . Spondylosis of lumbar region without myelopathy or radiculopathy 05/26/2015  . Encounter for screening mammogram for malignant neoplasm of breast 12/02/2014  . Goiter 12/02/2014  . Chronic idiopathic gout of foot (Right) 07/06/2014  . Biceps tendonitis (Right) 05/19/2014  . Shoulder pain (Right) 05/19/2014  . Facial weakness 05/05/2014  . Post herpetic neuralgia 05/05/2014  . Chronic dysfunction of left eustachian tube 03/24/2014  . Osteopenia 03/13/2014  . Family history of colon cancer 12/02/2013  . Dyshidrotic eczema 11/17/2013  . Diastasis recti 04/08/2013  . Facial nerve paresis 07/31/2012  . Blepharospasm 05/08/2012  . 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 Obesity 03/28/2012  . Hemoptysis 12/22/2011  . Neuropathic postherpetic trigeminal neuralgia 12/12/2011  . Allergic rhinitis, mild 10/12/2011  . Disequilibrium syndrome 09/27/2011  . Chronic bronchitis ([place]9/24/2012  . Essential hypertension 05/24/2011  . Type 2 diabetes  mellitus without complications ([place]) 88/50/2774  . Pulmonary nodules 05/05/2011   Janna Arch, PT, DPT   10/29/2018, 8:45 AM  [place] MAIN [facility] SERVICES [address] [place], [place], [address] Phone: [phone]   Fax:  [phone]  Name: Marenda Accardi MRN: [id number] Date of Birth: 05/31/1951

## 2018-10-31 ENCOUNTER — Ambulatory Visit: Payer: Medicare Other

## 2018-10-31 DIAGNOSIS — M6281 Muscle weakness (generalized): Secondary | ICD-10-CM

## 2018-10-31 DIAGNOSIS — M545 Low back pain, unspecified: Secondary | ICD-10-CM

## 2018-10-31 DIAGNOSIS — R293 Abnormal posture: Secondary | ICD-10-CM

## 2018-10-31 DIAGNOSIS — R2689 Other abnormalities of gait and mobility: Secondary | ICD-10-CM

## 2018-10-31 DIAGNOSIS — G8929 Other chronic pain: Secondary | ICD-10-CM

## 2018-10-31 NOTE — Therapy (Addendum)
[place] MAIN [facility] SERVICES [address] [place], [place], [address] Phone: [phone]   Fax:  [phone]  Physical Therapy Treatment  Patient Details  Name: Susan Callahan MRN: [id number] Date of Birth: 05/28/1951 Referring Provider (PT): Francesco Sor   Encounter Date: 10/31/2018  PT End of Session - 10/31/18 1502    Visit Number  24    Number of Visits  30    Date for PT Re-Evaluation  11/19/18    Authorization Type  5/10 starting 1/29    PT Start Time  1350    PT Stop Time  1429    PT Time Calculation (min)  39 min    Equipment Utilized During Treatment  Gait belt    Activity Tolerance  Patient tolerated treatment well    Behavior During Therapy  [facility] for tasks assessed/performed       Past Medical History:  Diagnosis Date  . COPD (chronic obstructive pulmonary disease) ([place])   . Depression 1974  . Diabetes mellitus without complication ([place])   . Hypertension   . Migraine   . 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 LUMBAR PERITONEAL SHUNT    . TYMPANOSTOMY TUBE PLACEMENT      There were no vitals filed for this visit.  Subjective Assessment - 10/31/18 1459    Subjective  Patient indicates that she is doing well today, but notes that she was quite sore after last session. In addition to the performance/addition of all of her new HEP, she went to workout after. Although she was very sore yesterday, she is feeling much better today and is only experiencing a little bit of back pain/discomfort.    Pertinent History  Pain began in 2007 and has gradually got worse and has progressed to nonstop. Patient states pain last all day long, everyday with no change in symptoms. Medication helps briefly. States she has difficulty with showering and standing. States she has to stand with her knee bent due to pulling sensation in legs. Pain begins immediately when stands and begins to  walk. Pain feels better when she bends her knees when standing. Pain became constant and non stop around Dec 2018. Denies bowel or bladder changes, unexplained weakness, or unexpected weight loss/gain. She currently goes to pulmonary rehab 3x a week, but has increased pain with treadmill walking. Will walk with cane on occasion around house especially at night due to occasional dizziness and vision impairments in medical history. States she also has difficulty with carrying groceries up steps due to low back pain. She would like to be able to walk better and have less pain to be less reliant on medication.     Limitations  Lifting;Standing;Walking;House hold activities    How long can you stand comfortably?  pain begins immediately     How long can you walk comfortably?  pain begins immediately     Patient Stated Goals  walk better, have less pain    Currently in Pain?  Yes    Pain Score  2     Pain Location  Back    Pain Orientation  Lower    Pain Descriptors / Indicators  Aching;Sore    Pain Type  Chronic pain    Pain Onset  More than a month ago    Pain Frequency  Intermittent      Manual Therapy: -Supine hamstring stretch 1x60 seconds each leg -Supine hamstring  stretch with dorsiflexion/plantarflexion for nerve glide 1x60 seconds each leg -Lumbar rotations x10 each side for low back stretching and stiffness -Rollertolumbar and thoracic paraspinals x76mnutesto increase muscle tissue length and reduce pain.  TherEx: -Supine SLR with TrA activation x10 each LE  -Standing hip abduction 1x10 each side in parallel bars with BUE support for balance; patient benefits from verbal and tactile cues for upright posture -Standing hip extension 1x10 each side in parallel bars with BUE support for balance; patient benefits from verbal and tactile cues for upright posture  -Sit to stands from standard chair height without use of BUE 2x10; patient demonstrates good control with descent and uses  legs effectively; however, patient initially demonstrates slight weight shift towards R; following verbal cueing for slight forward shift of RLE to promote additional weight bearing on LLE, patient demonstrates equal weight distribution through BLE during sit to stand  -Forward lunges using bosu ball as target for back LE x10 each side; patient requires intermittent verbal cues to keep knee of forward leg behind toes and to push through legs; performance in parallel bars with CGA and use of BUE support for balance; patient indicates she feels it is a good workout   **By end of session, patient indicates that her low back feels better/less stiff and that her pain is 0/10, and she feels that her BLE got a good workout                      PT Education - 10/31/18 1501    Education Details  HEP; exercise progression; BLE strengthening; exercise technique; importance of rest; sit to stand technique    Person(s) Educated  Patient    Methods  Explanation;Demonstration;Tactile cues;Verbal cues    Comprehension  Verbalized understanding;Returned demonstration;Verbal cues required;Tactile cues required;Need further instruction       PT Short Term Goals - 10/22/18 1616      PT SHORT TERM GOAL #1   Title  Patient will be independent with completion of HEP to improve ability to complete functional tasks and functional ADLs.    Baseline  10/23: will give HEP next session 11/19: HEP compliance    Time  2    Period  Weeks    Status  Achieved      PT SHORT TERM GOAL #2   Title  Patient will report pain score less than 4/10 on VAS to demonstrate reduction of pain levels with functional tasks.     Baseline  10/23: 6/10 11/19: 5/10 12/12: 9/10 1/10: 7/10  2/11: 3/10     Time  2    Period  Weeks    Status  Achieved        PT Long Term Goals - 10/22/18 1616      PT LONG TERM GOAL #1   Title  Patient will report pain score of <3/10 on VAS for decreased pain levels and ease with  functional activities.    Baseline  10/23: 6/10 11/19: 5/10 12/12 9/10 1/10: 7/10 2/11: 3/10     Time  4    Period  Weeks    Status  Partially Met    Target Date  11/19/18      PT LONG TERM GOAL #2   Title  Patient will be able to ambulate on treadmill for 171mutes without increase in back/leg pain demonstrating improved community ambulation and treadmill walking at pulmonary rehab    Baseline  10/23: 58m33mtes increases pain 11/19: 8mi9mes 12/12: has not  had the chance to perform 1/10: was able to do 5 minutes without pain 2/11: able to do 5 minutes then had to bend over    Time  4    Period  Weeks    Status  Partially Met    Target Date  11/19/18      PT LONG TERM GOAL #3   Title  Patient will have decreased modified oswestry score <50% for improved ability to complete household tasks with less pain levels.     Baseline  10/23: 62% 11/19: 42%    Time  4    Period  Weeks    Status  Achieved      PT LONG TERM GOAL #4   Title  Patient will score 4+/5 on BLE MMT to demonstrate improved glute strength and ability to carry groceries with increased ease up/down stairs at home.    Baseline  10/23: 4-/5 grossly 11/19: 4/5 grossly; hamstrings 4-/5 12/12: 4/5 gross 1/10: 4/5 gross 2/11: gross 4+/5 with hamstrings and abuductors 4-/5    Time  4    Period  Weeks    Status  Partially Met    Target Date  11/19/18      PT LONG TERM GOAL #5   Title  Patient will have decreased modified oswestry score <35% for improved ability to complete household tasks with less pain levels.     Baseline  11/19: 42% 12/12: 52% 1/10: 48%  2/11: 30%     Time  4    Period  Weeks    Status  Achieved      Additional Long Term Goals   Additional Long Term Goals  Yes      PT LONG TERM GOAL #6   Title  Patient will be able to make and/or strip the bed without pain and without needing rest breaks to perform iADLs independently.     Baseline  2/11: pain limits ability to perform, ~4 breaks required    Time  4     Period  Weeks    Status  New    Target Date  11/19/18      PT LONG TERM GOAL #7   Title  Patient will have decreased modified oswestry score <20% for improved ability to complete household tasks with less pain levels.     Baseline  2/11: 30%     Time  4    Period  Weeks    Status  New    Target Date  11/19/18            Plan - 10/31/18 1608    Clinical Impression Statement  The patient continues to progress with strengthening exercises. Elected for BLE strengthening as opposed to core strengthening today due to patient's episode of increased back pain yesterday due to volume of exercise on Tuesday. Patient performed well with BLE strengthening exercises and displayed proper form and control with performance of standing exercises, sit to stands, and forward lunges. Patient will continue to benefit from skilled physical therapy to improve strength, reduce pain levels with ADLs, and to improve overall function and quality of life.    Rehab Potential  Fair    Clinical Impairments Affecting Rehab Potential  (+) motivation (-) chronicity of symptoms    PT Frequency  2x / week    PT Duration  4 weeks    PT Treatment/Interventions  ADLs/Self Care Home Management;Cryotherapy;Moist Heat;Traction;Iontophoresis 19m/ml Dexamethasone;Ultrasound;Gait training;Stair training;Functional mobility training;Neuromuscular re-education;Balance training;Therapeutic exercise;Therapeutic activities;Patient/family education;Manual techniques;Energy conservation;Aquatic Therapy;EDealer  Stimulation;Passive range of motion;Dry needling;Taping    PT Next Visit Plan  new core HEP     PT Home Exercise Plan  see sheet    Consulted and Agree with Plan of Care  Patient       Patient will benefit from skilled therapeutic intervention in order to improve the following deficits and impairments:  Abnormal gait, Cardiopulmonary status limiting activity, Decreased activity tolerance, Decreased balance, Decreased  endurance, Decreased range of motion, Difficulty walking, Decreased strength, Hypomobility, Decreased mobility, Impaired perceived functional ability, Impaired flexibility, Postural dysfunction, Obesity, Improper body mechanics, Pain  Visit Diagnosis: Chronic low back pain, unspecified back pain laterality, unspecified whether sciatica present  Abnormal posture  Muscle weakness (generalized)  Other abnormalities of gait and mobility     Problem List Patient Active Problem List   Diagnosis Date Noted  . DDD (degenerative disc disease), lumbosacral 10/10/2018  . Abnormal MRI, lumbar spine (08/29/2018) 10/10/2018  . Lumbar Grade 1 Anterolisthesis of L3/4 (18m) & L4/5 (4-165m(w/ Dynamic instability) 10/10/2018  . 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 Lumbar foraminal stenosis (L3-4, L4-5, L5-S1) (L>R) 10/10/2018  . Lumbar nerve root impingement (Bilateral: L4 & L5) (L>R: L5) 10/10/2018  . Lumbar Spinal instability of L4-5 10/10/2018  . Lumbar facet hypertrophy 10/10/2018  . COPD (chronic obstructive pulmonary disease) with emphysema ([place] Pass12/18/2019  . Elevated rheumatoid factor 08/13/2018  . Elevated C-reactive protein (CRP) 08/12/2018  . Elevated sed rate 08/12/2018  . Lumbar facet syndrome (Bilateral) 08/12/2018  . Back pain with left-sided radiculopathy 08/12/2018  . DDD (degenerative disc disease), lumbar 08/12/2018  . Chronic lower extremity pain (Primary Area of Pain) (Bilateral) (L>R) 07/23/2018  . Chronic low back pain (Secondary Area of Pain) (Bilateral) (L>R) w/ sciatica (Bilateral) 07/23/2018  . Chronic pain syndrome 07/23/2018  . Pharmacologic therapy 07/23/2018  . Disorder of skeletal system 07/23/2018  . Problems influencing health status 07/23/2018  . Chronic sacroiliac joint pain ([facility]) (Bilateral) (L>R) 07/23/2018  . Status post lumboperitoneal shunt placement 02/26/2018  . 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 Tympanic membrane perforation 05/30/2016  . PMB (postmenopausal bleeding) 04/26/2016  . Rash 12/27/2015  . 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 Plantar fasciitis of foot (Left) 05/26/2015  . Spondylosis of lumbar region without myelopathy or radiculopathy 05/26/2015  . Encounter for screening mammogram for malignant neoplasm of breast 12/02/2014  . Goiter 12/02/2014  . Chronic idiopathic gout of foot (Right) 07/06/2014  . Biceps tendonitis (Right) 05/19/2014  . Shoulder pain (Right) 05/19/2014  . Facial weakness 05/05/2014  . Post herpetic neuralgia 05/05/2014  . Chronic dysfunction of left eustachian tube 03/24/2014  . Osteopenia 03/13/2014  . Family history of colon cancer 12/02/2013  . Dyshidrotic eczema 11/17/2013  . Diastasis recti 04/08/2013  . Facial nerve paresis 07/31/2012  . Blepharospasm 05/08/2012  . 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 Obesity 03/28/2012  . Hemoptysis 12/22/2011  . Neuropathic postherpetic trigeminal neuralgia 12/12/2011  . Allergic rhinitis, mild 10/12/2011  . Disequilibrium syndrome 09/27/2011  . Chronic bronchitis (HCT[place]09/24/2012  . Essential hypertension 05/24/2011  . Type 2 diabetes mellitus without complications ([id number]. Pulmonary nodules 05/05/2011   ZaOrlean PattenSPT  This entire session was performed under direct supervision and direction of a licensed therapist/therapist assistant . I have personally read, edited and approve of the note as  written.  Janna Arch, PT, DPT    10/31/2018, 4:15 PM  [place] MAIN [facility] SERVICES [address] [place], [place], [address] Phone: [phone]   Fax:  [phone]  Name: Katira Dumais MRN: [id number] Date of Birth: June 16, 1951

## 2018-11-04 ENCOUNTER — Encounter: Payer: Self-pay | Admitting: Nurse Practitioner

## 2018-11-04 ENCOUNTER — Other Ambulatory Visit: Payer: Self-pay

## 2018-11-04 ENCOUNTER — Ambulatory Visit: Payer: Medicare Other | Attending: Nurse Practitioner | Admitting: Nurse Practitioner

## 2018-11-04 VITALS — BP 141/78 | HR 73 | Temp 98.0°F | Ht 65.0 in | Wt 206.0 lb

## 2018-11-04 DIAGNOSIS — G894 Chronic pain syndrome: Secondary | ICD-10-CM | POA: Diagnosis present

## 2018-11-04 DIAGNOSIS — M5137 Other intervertebral disc degeneration, lumbosacral region: Secondary | ICD-10-CM | POA: Diagnosis present

## 2018-11-04 DIAGNOSIS — M47816 Spondylosis without myelopathy or radiculopathy, lumbar region: Secondary | ICD-10-CM | POA: Insufficient documentation

## 2018-11-04 DIAGNOSIS — H7202 Central perforation of tympanic membrane, left ear: Secondary | ICD-10-CM | POA: Insufficient documentation

## 2018-11-04 DIAGNOSIS — M5416 Radiculopathy, lumbar region: Secondary | ICD-10-CM | POA: Diagnosis present

## 2018-11-04 MED ORDER — TRAMADOL HCL 50 MG PO TABS: 50.0000 mg | ORAL_TABLET | Freq: Four times a day (QID) | ORAL | 0 refills | Status: DC | PRN

## 2018-11-04 NOTE — Progress Notes (Signed)
Patient's Name: Susan Callahan  MRN: [id number]  Referring Provider: Konrad Saha, MD  DOB: August 29, 1951  PCP: Konrad Saha, MD  DOS: 11/04/2018  Note by: Dionisio David, NP  Service setting: Ambulatory outpatient  Specialty: Interventional Pain Management  Location: [facility] (AMB) Pain Management Facility    Patient type: Established   HPI  Reason for Visit: Susan Callahan is a 68 y.o. year old, female patient, who comes today with a chief complaint of Back Pain Last Appointment: Her last appointment at our practice was on 10/28/2018. I last saw her on 07/23/2018.  Pain Assessment: Today, Susan Callahan describes the severity of the Chronic pain as a 3 /10. She indicates the location/referral of the pain to be Back Lower/pain radiaties down the back of both both legs to calf but not as bad. Onset was: More than a month ago. The quality of pain is described as Aching, Sore. Temporal description, or timing of pain is: Intermittent. Possible modifying factors: medications, laying down , procedures. [name]  height is 5' 5"  (1.651 m) and weight is 206 lb (93.4 kg). Her temperature is 98 F (36.7 C). Her blood pressure is 141/78 (abnormal) and her pulse is 73. Her oxygen saturation is 96%.  She feels like her leg pain has resolved. She did see Dr Lacinda Axon as recommended and she was encouraged to call back if her pain got worse. She will call him in the future if needed.  She is very encouraged by the pain relief with the epidurals.  Controlled Substance Pharmacotherapy Assessment REMS (Risk Evaluation and Mitigation Strategy)  Analgesic: Tramadol 50 mg 1 tablet 4 times daily  MME/day:53m/day  BChauncey Fischer RN  11/04/2018 10:51 AM  Sign when Signing Visit Nursing Pain Medication Assessment:  Safety precautions to be maintained throughout the outpatient stay will include: orient to surroundings, keep bed in low position, maintain call bell within reach at all times, provide assistance with  transfer out of bed and ambulation.  Medication Inspection Compliance: Pill count conducted under aseptic conditions, in front of the patient. Neither the pills nor the bottle was removed from the patient's sight at any time. Once count was completed pills were immediately returned to the patient in their original bottle.  Medication: Tramadol (Ultram) Pill/Patch Count: 58 of 120 pills remain Pill/Patch Appearance: Markings consistent with prescribed medication Bottle Appearance: Standard pharmacy container. Clearly labeled. Filled Date: 2 / 5 / 2020 Last Medication intake:  Today   Pharmacokinetics: Liberation and absorption (onset of action): WNL Distribution (time to peak effect): WNL Metabolism and excretion (duration of action): WNL         Pharmacodynamics: Desired effects: Analgesia: Ms. CDurkinreports >50% benefit. Functional ability: Patient reports that medication allows her to accomplish basic ADLs Clinically meaningful improvement in function (CMIF): Sustained CMIF goals met Perceived effectiveness: Described as relatively effective, allowing for increase in activities of daily living (ADL) Undesirable effects: Side-effects or Adverse reactions: None reported Monitoring:  PMP: Online review of the past 1103-montheriod conducted. Compliant with practice rules and regulations Last UDS on record: Summary  Date Value Ref Range Status  07/23/2018 FINAL  Final    Comment:    ==================================================================== [facility] COMP DRUG ANALYSIS,UR ==================================================================== Test                             Result       Flag       Units  Drug Present and Declared for Prescription Verification   Tramadol                       >2463        EXPECTED   ng/mg creat   O-Desmethyltramadol            >2463        EXPECTED   ng/mg creat   N-Desmethyltramadol            >2463        EXPECTED   ng/mg creat    Source  of tramadol is a prescription medication.    O-desmethyltramadol and N-desmethyltramadol are expected    metabolites of tramadol.   Gabapentin                     PRESENT      EXPECTED   Amitriptyline                  PRESENT      EXPECTED   Nortriptyline                  PRESENT      EXPECTED    Nortriptyline is an expected metabolite of amitriptyline.   Verapamil                      PRESENT      EXPECTED Drug Present not Declared for Prescription Verification   Acetaminophen                  PRESENT      UNEXPECTED   Ibuprofen                      PRESENT      UNEXPECTED   Lidocaine                      PRESENT      UNEXPECTED   Dextrorphan/Levorphanol        PRESENT      UNEXPECTED    Dextrorphan is an expected metabolite of dextromethorphan, an    over-the-counter or prescription cough suppressant. Levorphanol    is a scheduled prescription medication. Dextrorphan cannot be    distinguished from levorphanol by the method used for analysis.   Guaifenesin                    PRESENT      UNEXPECTED    Guaifenesin may be administered as an over-the-counter or    prescription drug; it may also be present as a breakdown product    of methocarbamol. ==================================================================== Test                      Result    Flag   Units      Ref Range   Creatinine              203              mg/dL      >=20 ==================================================================== Declared Medications:  The flagging and interpretation on this report are based on the  following declared medications.  Unexpected results may arise from  inaccuracies in the declared medications.  **Note: The testing scope of this panel includes these medications:  Amitriptyline  Gabapentin  Tramadol  Verapamil  **Note: The testing scope of this panel does not include  following  reported medications:  Allopurinol  Calcium carbonate (Calcarb with Vitamin D)  Furosemide   Magnesium  Metformin  Multivitamin  Mupirocin  Spironolactone  Umeclidinium (Incruse Ellipta)  Vitamin B (Super B Complex)  Vitamin D (Calcarb with Vitamin D) ==================================================================== For clinical consultation, please call [phone]. ====================================================================    UDS interpretation: Compliant          Medication Assessment Form: Reviewed. Patient indicates being compliant with therapy Treatment compliance: Compliant Risk Assessment Profile: Aberrant behavior: See initial evaluations. None observed or detected today Comorbid factors increasing risk of overdose: See initial evaluation. No additional risks detected today Opioid risk tool (ORT):  Opioid Risk  11/04/2018  Alcohol 0  Illegal Drugs 0  Rx Drugs 0  Alcohol 0  Illegal Drugs 0  Rx Drugs 0  Age between 16-45 years  0  History of Preadolescent Sexual Abuse 0  Psychological Disease 0  Depression 0  Opioid Risk Tool Scoring 0  Opioid Risk Interpretation Low Risk    ORT Scoring interpretation table:  Score <3 = Low Risk for SUD  Score between 4-7 = Moderate Risk for SUD  Score >8 = High Risk for Opioid Abuse   Risk of substance use disorder (SUD): Low  Risk Mitigation Strategies:  Patient Counseling: Covered Patient-Prescriber Agreement (PPA): Present and active  Notification to other healthcare providers: Done  Pharmacologic Plan: No change in therapy, at this time.            Evaluation of last interventional procedure  10/10/2018 procedure: Bilateral transforaminal lumbar epidural steroid injection L4 and left-sided epidural steroid injection L3-4 Pre-procedure pain score:  3/10 Post-procedure pain score: 0/10         Influential Factors: Intra-procedural challenges: None observed.         Reported side-effects: None.        Post-procedural adverse reactions or complications: None reported         Sedation: Please see  nurses note for DOS. When no sedatives are used, the analgesic levels obtained are directly associated to the effectiveness of the local anesthetics. However, when sedation is provided, the level of analgesia obtained during the initial 1 hour following the intervention, is believed to be the result of a combination of factors. These factors may include, but are not limited to: 1. The effectiveness of the local anesthetics used. 2. The effects of the analgesic(s) and/or anxiolytic(s) used. 3. The degree of discomfort experienced by the patient at the time of the procedure. 4. The patients ability and reliability in recalling and recording the events. 5. The presence and influence of possible secondary gains and/or psychosocial factors. Reported result: Relief experienced during the 1st hour after the procedure:   (Ultra-Short Term Relief)            Interpretative annotation: Clinically appropriate result. Analgesia during this period is likely to be Local Anesthetic and/or IV Sedative (Analgesic/Anxiolytic) related.          Effects of local anesthetic: The analgesic effects attained during this period are directly associated to the localized infiltration of local anesthetics and therefore cary significant diagnostic value as to the etiological location, or anatomical origin, of the pain. Expected duration of relief is directly dependent on the pharmacodynamics of the local anesthetic used. Long-acting (4-6 hours) anesthetics used.  Reported result: Relief during the next 4 to 6 hour after the procedure:   (Short-Term Relief)            Interpretative annotation: Clinically appropriate  result. Analgesia during this period is likely to be Local Anesthetic-related.          Long-term benefit: Defined as the period of time past the expected duration of local anesthetics (1 hour for short-acting and 4-6 hours for long-acting). With the possible exception of prolonged sympathetic blockade from the local  anesthetics, benefits during this period are typically attributed to, or associated with, other factors such as analgesic sensory neuropraxia, antiinflammatory effects, or beneficial biochemical changes provided by agents other than the local anesthetics.  Reported result: Extended relief following procedure:   (Long-Term Relief)            Interpretative annotation: Clinically appropriate result. Good relief.    Inflammation plays a part in the etiology to the pain.        .  ROS  Constitutional: Denies any fever or chills Gastrointestinal: No reported hemesis, hematochezia, vomiting, or acute GI distress Musculoskeletal: Denies any acute onset joint swelling, redness, loss of ROM, or weakness Neurological: No reported episodes of acute onset apraxia, aphasia, dysarthria, agnosia, amnesia, paralysis, loss of coordination, or loss of consciousness  Medication Review  Calcium Carbonate-Vitamin D, allopurinol, amitriptyline, b complex vitamins, cephALEXin, furosemide, gabapentin, magnesium oxide, metFORMIN, multivitamin, mupirocin ointment, traMADol, umeclidinium bromide, and verapamil  History Review  Allergy: Ms. Fenderson is allergic to penicillins; sulfa antibiotics; and bee venom. Drug: Ms. Denis  reports no history of drug use. Alcohol:  reports no history of alcohol use. Tobacco:  reports that she quit smoking about 27 years ago. Her smoking use included cigarettes. She has a 81.00 pack-year smoking history. She has never used smokeless tobacco. Social: Ms. Akard  reports that she quit smoking about 27 years ago. Her smoking use included cigarettes. She has a 81.00 pack-year smoking history. She has never used smokeless tobacco. She reports that she does not drink alcohol or use drugs. Medical:  has a past medical history of COPD (chronic obstructive pulmonary disease) ([place]), Depression (1974), Diabetes mellitus without complication ([place]), Hypertension, Migraine, and Ramsay Hunt auricular  syndrome. Surgical: Ms. Silverthorn  has a past surgical history that includes Tympanostomy tube placement and Lumbar peritoneal shunt. Family: family history is not on file. Problem List: Ms. 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  Lab Review  Kidney Function Lab Results  Component Value Date   BUN 21 07/23/2018   CREATININE 1.05 (H) 07/23/2018   BCR 20 07/23/2018   GFRAA 64 07/23/2018   GFRNONAA 55 (L) 07/23/2018  Liver Function Lab Results  Component Value Date   AST 17 07/23/2018   ALBUMIN 4.7 07/23/2018  Note: Above Lab results reviewed.  Imaging Review  DG C-Arm 1-60 Min-No Report Fluoroscopy was utilized by the requesting physician.  No radiographic  interpretation.  Note: Reviewed        Physical Exam  General appearance: Well nourished, well developed, and well hydrated. In no apparent acute distress Mental status: Alert, oriented x 3 (person, place, & time)       Respiratory: No evidence of acute respiratory distress Eyes: PERLA Vitals: BP (!) 141/78   Pulse 73   Temp 98 F (36.7 C)   Ht 5' 5"  (1.651 m)   Wt 206 lb (93.4 kg)   SpO2 96%   BMI 34.28 kg/m  BMI: Estimated body mass index is 34.28 kg/m as calculated from  the following:   Height as of this encounter: 5' 5"  (1.651 m).   Weight as of this encounter: 206 lb (93.4 kg). Ideal: Ideal body weight: 57 kg (125 lb 10.6 oz) Adjusted ideal body weight: 71.6 kg (157 lb 12.8 oz) Lumbar Spine Area Exam  Skin & Axial Inspection: No masses, redness, or swelling Alignment: Symmetrical Functional ROM: Unrestricted ROM       Stability: No instability detected Muscle Tone/Strength: Functionally intact. No obvious neuro-muscular anomalies detected. Sensory (Neurological): Unimpaired Palpation: No palpable anomalies        Provocative Tests: Hyperextension/rotation test: deferred today       Lumbar quadrant test (Kemp's test): deferred today       Lateral bending test: deferred today       Patrick's Maneuver: deferred today                    Lower Extremity Exam    Side: Right lower extremity  Side: Left lower extremity  Stability: No instability observed          Stability: No instability observed          Skin & Extremity Inspection: Skin color, temperature, and hair growth are WNL. No peripheral edema or cyanosis. No masses, redness, swelling, asymmetry, or associated skin lesions. No contractures.  Skin & Extremity Inspection: Skin color, temperature, and hair growth are WNL. No peripheral edema or cyanosis. No masses, redness, swelling, asymmetry, or associated skin lesions. No contractures.  Functional ROM: Unrestricted ROM                  Functional ROM: Unrestricted ROM                  Muscle Tone/Strength: Functionally intact. No obvious neuro-muscular anomalies detected.  Muscle Tone/Strength: Functionally intact. No obvious neuro-muscular anomalies detected.  Sensory (Neurological): Unimpaired        Sensory (Neurological): Unimpaired            Palpation: No palpable anomalies  Palpation: No palpable anomalies   Assessment   Status Diagnosis  Controlled Controlled Controlled 1. Spondylosis of lumbar region without myelopathy or radiculopathy   2. Lumbar nerve root impingement (Bilateral: L4 & L5) (L>R: L5)   3. DDD (degenerative disc disease), lumbosacral   4. Chronic pain syndrome      Updated Problems: Problem  Lumbar foraminal stenosis (L3-4, L4-5, L5-S1) (L>R)   Levels: L3-4: 2 mm anterolisthesis w/ Foraminal zone narrowing is present without definite compression of the L3 nerve roots. L4-5: 4 mm anterolisthesis w/ mild foraminal narrowing not clearly compressive. L5-S1: LEFT-sided foraminal narrowing is severe, affecting the LEFT L5 nerve root. Asymmetric facet arthropathy is  contributory.  Levels: L3-4: 2 mm anterolisthesis w/ Foraminal zone narrowing is present without definite compression of the L3 nerve roots. L4-5: 4 mm anterolisthesis w/ mild foraminal narrowing not clearly compressive. L5-S1: LEFT-sided foraminal narrowing is severe, affecting the LEFT L5 nerve root. Asymmetric facet arthropathy is contributory.   Spinal Stenosis of Lumbar Region With Neurogenic Claudication   Levels: L3-4: Moderate to severe stenosis. L4-5: severe, near critical stenosis.   Lumbar Grade 1 Anterolisthesis of L3/4 (163m) & L4/5 (4-166m(w/ Dynamic instability)   FINDINGS: Grade 1 Anterolisthesis L3/4 (34m45mand L4/5 (4-63m37mith dynamic instability.  Levels: L3-4: 2 mm anterolisthesis is facet mediated. L4-5: 4 mm anterolisthesis is facet mediated.  Correlating with patient's lumbar spine radiographs, there is significant dynamic instability at L4-5. As measured on standing flexion, 10 mm  of anterolisthesis develops at L4-5.  Levels: L1-2: RIGHT greater than LEFT facet arthropathy. L2-3: Posterior element hypertrophy. L3-4: 2 mm anterolisthesis is facet mediated. Posterior element hypertrophy is noted. L4-5: 4 mm anterolisthesis is facet mediated. There is posterior element hypertrophy affecting facets L5-S1: Osseous spurring posterior element hypertrophy.  FINDINGS: Grade 1 Anterolisthesis L3/4 (14m) and L4/5 (4-134m with dynamic instability.  Levels: L3-4: 2 mm anterolisthesis is facet mediated. L4-5: 4 mm anterolisthesis is facet mediated.  Correlating with patient's lumbar spine radiographs, there is significant dynamic instability at L4-5. As measured on standing flexion, 10 mm of anterolisthesis develops at L4-5.     Plan of Care  Pharmacotherapy (Medications Ordered): Meds ordered this encounter  Medications  . traMADol (ULTRAM) 50 MG tablet    Sig: Take 1 tablet (50 mg total) by mouth every 6 (six) hours as needed for up to 30 days for severe  pain. Month last 30 days.    Dispense:  120 tablet    Refill:  0    Do not place this medication, or any other prescription from our practice, on "Automatic Refill". Patient may have prescription filled one day early if pharmacy is closed on scheduled refill date.    Order Specific Question:   Supervising Provider    Answer:   NAMilinda Pointer9[[id number]] Administered today: LiSonia SideCaMelkaad no medications administered during this visit.  Orders:  No orders of the defined types were placed in this encounter.  Interventional options: Planned follow-up:   Not at this time the patient will call if additional procedures are needed. Plan: Return in about 4 weeks (around 12/02/2018) for MedMgmt.   Considering:   Diagnostic midline lumbar epidural steroid injection  Diagnostic bilateral lumbar facet nerve block  Possible bilateral lumbar facet radiofrequency ablation    PRN Procedures:   None at this time    Note by: CrDionisio DavidNP Date: 11/04/2018; Time: 11:54 AM

## 2018-11-04 NOTE — Progress Notes (Signed)
Nursing Pain Medication Assessment:  Safety precautions to be maintained throughout the outpatient stay will include: orient to surroundings, keep bed in low position, maintain call bell within reach at all times, provide assistance with transfer out of bed and ambulation.  Medication Inspection Compliance: Pill count conducted under aseptic conditions, in front of the patient. Neither the pills nor the bottle was removed from the patient's sight at any time. Once count was completed pills were immediately returned to the patient in their original bottle.  Medication: Tramadol (Ultram) Pill/Patch Count: 58 of 120 pills remain Pill/Patch Appearance: Markings consistent with prescribed medication Bottle Appearance: Standard pharmacy container. Clearly labeled. Filled Date: 2 / 5 / 2020 Last Medication intake:  Today

## 2018-11-04 NOTE — Patient Instructions (Signed)
____________________________________________________________________________________________  Medication Rules  Purpose: To inform patients, and their family members, of our rules and regulations.  Applies to: All patients receiving prescriptions (written or electronic).  Pharmacy of record: Pharmacy where electronic prescriptions will be sent. If written prescriptions are taken to a different pharmacy, please inform the nursing staff. The pharmacy listed in the electronic medical record should be the one where you would like electronic prescriptions to be sent.  Electronic prescriptions: In compliance with the [place] Strengthen Opioid Misuse Prevention (STOP) Act of 2017 (Session Law 2017-74/H243), effective September 11, 2018, all controlled substances must be electronically prescribed. Calling prescriptions to the pharmacy will cease to exist.  Prescription refills: Only during scheduled appointments. Applies to all prescriptions.  NOTE: The following applies primarily to controlled substances (Opioid* Pain Medications).   Patient's responsibilities: 1. Pain Pills: Bring all pain pills to every appointment (except for procedure appointments). 2. Pill Bottles: Bring pills in original pharmacy bottle. Always bring the newest bottle. Bring bottle, even if empty. 3. Medication refills: You are responsible for knowing and keeping track of what medications you take and those you need refilled. The day before your appointment: write a list of all prescriptions that need to be refilled. The day of the appointment: give the list to the admitting nurse. Prescriptions will be written only during appointments. No prescriptions will be written on procedure days. If you forget a medication: it will not be "Called in", "Faxed", or "electronically sent". You will need to get another appointment to get these prescribed. No early refills. Do not call asking to have your prescription filled  early. 4. Prescription Accuracy: You are responsible for carefully inspecting your prescriptions before leaving our office. Have the discharge nurse carefully go over each prescription with you, before taking them home. Make sure that your name is accurately spelled, that your address is correct. Check the name and dose of your medication to make sure it is accurate. Check the number of pills, and the written instructions to make sure they are clear and accurate. Make sure that you are given enough medication to last until your next medication refill appointment. 5. Taking Medication: Take medication as prescribed. When it comes to controlled substances, taking less pills or less frequently than prescribed is permitted and encouraged. Never take more pills than instructed. Never take medication more frequently than prescribed.  6. Inform other Doctors: Always inform, all of your healthcare providers, of all the medications you take. 7. Pain Medication from other Providers: You are not allowed to accept any additional pain medication from any other Doctor or Healthcare provider. There are two exceptions to this rule. (see below) In the event that you require additional pain medication, you are responsible for notifying us, as stated below. 8. Medication Agreement: You are responsible for carefully reading and following our Medication Agreement. This must be signed before receiving any prescriptions from our practice. Safely store a copy of your signed Agreement. Violations to the Agreement will result in no further prescriptions. (Additional copies of our Medication Agreement are available upon request.) 9. Laws, Rules, & Regulations: All patients are expected to follow all Federal and State Laws, Statutes, Rules, & Regulations. Ignorance of the Laws does not constitute a valid excuse. The use of any illegal substances is prohibited. 10. Adopted CDC guidelines & recommendations: Target dosing levels will be  at or below 60 MME/day. Use of benzodiazepines** is not recommended.  Exceptions: There are only two exceptions to the rule of not   receiving pain medications from other Healthcare Providers. 1. Exception #1 (Emergencies): In the event of an emergency (i.e.: accident requiring emergency care), you are allowed to receive additional pain medication. However, you are responsible for: As soon as you are able, call our office [phone], at any time of the day or night, and leave a message stating your name, the date and nature of the emergency, and the name and dose of the medication prescribed. In the event that your call is answered by a member of our staff, make sure to document and save the date, time, and the name of the person that took your information.  2. Exception #2 (Planned Surgery): In the event that you are scheduled by another doctor or dentist to have any type of surgery or procedure, you are allowed (for a period no longer than 30 days), to receive additional pain medication, for the acute post-op pain. However, in this case, you are responsible for picking up a copy of our "Post-op Pain Management for Surgeons" handout, and giving it to your surgeon or dentist. This document is available at our office, and does not require an appointment to obtain it. Simply go to our office during business hours (Monday-Thursday from 8:00 AM to 4:00 PM) (Friday 8:00 AM to 12:00 Noon) or if you have a scheduled appointment with us, prior to your surgery, and ask for it by name. In addition, you will need to provide us with your name, name of your surgeon, type of surgery, and date of procedure or surgery.  *Opioid medications include: morphine, codeine, oxycodone, oxymorphone, hydrocodone, hydromorphone, meperidine, tramadol, tapentadol, buprenorphine, fentanyl, methadone. **Benzodiazepine medications include: diazepam (Valium), alprazolam (Xanax), clonazepam (Klonopine), lorazepam (Ativan), clorazepate  (Tranxene), chlordiazepoxide (Librium), estazolam (Prosom), oxazepam (Serax), temazepam (Restoril), triazolam (Halcion) (Last updated: 11/08/2017) ____________________________________________________________________________________________    

## 2018-11-05 ENCOUNTER — Ambulatory Visit: Payer: Medicare Other

## 2018-11-06 ENCOUNTER — Encounter: Payer: Medicare Other | Admitting: Nurse Practitioner

## 2018-11-07 ENCOUNTER — Ambulatory Visit: Payer: Medicare Other

## 2018-11-07 DIAGNOSIS — M545 Low back pain, unspecified: Secondary | ICD-10-CM

## 2018-11-07 DIAGNOSIS — G8929 Other chronic pain: Secondary | ICD-10-CM

## 2018-11-07 DIAGNOSIS — R2689 Other abnormalities of gait and mobility: Secondary | ICD-10-CM

## 2018-11-07 DIAGNOSIS — R293 Abnormal posture: Secondary | ICD-10-CM

## 2018-11-07 DIAGNOSIS — M6281 Muscle weakness (generalized): Secondary | ICD-10-CM

## 2018-11-07 NOTE — Therapy (Signed)
Susan Callahan MAIN [facility] SERVICES [address] [place], [place], [address] Phone: [phone]   Fax:  [phone]  Physical Therapy Treatment  Patient Details  Name: Susan Callahan MRN: [id number] Date of Birth: 05-28-1951 Referring Provider (PT): Francesco Sor   Encounter Date: 11/07/2018  PT End of Session - 11/07/18 1526    Visit Number  25    Number of Visits  30    Date for PT Re-Evaluation  11/19/18    Authorization Type  6/10 starting 1/29    PT Start Time  1507    PT Stop Time  1550    PT Time Calculation (min)  43 min    Equipment Utilized During Treatment  Gait belt    Activity Tolerance  Patient tolerated treatment well    Behavior During Therapy  [facility] for tasks assessed/performed       Past Medical History:  Diagnosis Date  . COPD (chronic obstructive pulmonary disease) ([place])   . Depression 1974  . Diabetes mellitus without complication ([place])   . Hypertension   . Migraine   . 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 LUMBAR PERITONEAL SHUNT    . TYMPANOSTOMY TUBE PLACEMENT      There were no vitals filed for this visit.  Subjective Assessment - 11/07/18 1509    Subjective  Patient unloaded garden supplies today and did the Nustep for 30 minutes prior to session. Is feeling more sore today now after that.     Pertinent History  Pain began in 2007 and has gradually got worse and has progressed to nonstop. Patient states pain last all day long, everyday with no change in symptoms. Medication helps briefly. States she has difficulty with showering and standing. States she has to stand with her knee bent due to pulling sensation in legs. Pain begins immediately when stands and begins to walk. Pain feels better when she bends her knees when standing. Pain became constant and non stop around Dec 2018. Denies bowel or bladder changes, unexplained weakness, or unexpected weight  loss/gain. She currently goes to pulmonary rehab 3x a week, but has increased pain with treadmill walking. Will walk with cane on occasion around house especially at night due to occasional dizziness and vision impairments in medical history. States she also has difficulty with carrying groceries up steps due to low back pain. She would like to be able to walk better and have less pain to be less reliant on medication.     Limitations  Lifting;Standing;Walking;House hold activities    How long can you stand comfortably?  pain begins immediately     How long can you walk comfortably?  pain begins immediately     Patient Stated Goals  walk better, have less pain    Currently in Pain?  Yes    Pain Score  3     Pain Location  Back    Pain Orientation  Lower    Pain Descriptors / Indicators  Aching;Sore    Pain Type  Chronic pain    Pain Onset  More than a month ago    Pain Frequency  Intermittent         Manual Therapy: -Supine hamstring stretch 1x60 seconds each leg -Supine hamstring stretch with dorsiflexion/plantarflexion for nerve glide 1x60 seconds each leg -Lumbar rotations x10 each side for low back stretching and stiffness -Roller to lumbar and thoracic paraspinals x3 minutes  to increase muscle tissue length and reduce pain.    TherEx: -Supine SLR with TrA activation x15 each LE  -supine dead bug 10x each LE / UE for cross body coordination and core stability   -paloff press GTB 15x each LE., verbal cueing for body mechanics in seated position     -farmer carry with 7lb dumbbells in each hand, verbal cueing for upright posture and core activation , 3 trials of 86 ft with exercises implemented between each set.     -Sit to stands from standard chair height without use of BUE 2x10; patient demonstrates good control with descent and uses legs effectively; however, patient initially demonstrates slight weight shift towards R; following verbal cueing for slight forward shift of RLE  to promote additional weight bearing on LLE, patient demonstrates equal weight distribution through BLE during sit to stand    TENS: 120 Hz 50 [place], Const level 6 on during interventions.                    PT Education - 11/07/18 1525    Education Details  exercise technique, core stability, posture     Person(s) Educated  Patient    Methods  Explanation;Demonstration;Tactile cues;Verbal cues    Comprehension  Verbalized understanding;Returned demonstration;Verbal cues required;Tactile cues required;Need further instruction       PT Short Term Goals - 10/22/18 1616      PT SHORT TERM GOAL #1   Title  Patient will be independent with completion of HEP to improve ability to complete functional tasks and functional ADLs.    Baseline  10/23: will give HEP next session 11/19: HEP compliance    Time  2    Period  Weeks    Status  Achieved      PT SHORT TERM GOAL #2   Title  Patient will report pain score less than 4/10 on VAS to demonstrate reduction of pain levels with functional tasks.     Baseline  10/23: 6/10 11/19: 5/10 12/12: 9/10 1/10: 7/10  2/11: 3/10     Time  2    Period  Weeks    Status  Achieved        PT Long Term Goals - 10/22/18 1616      PT LONG TERM GOAL #1   Title  Patient will report pain score of <3/10 on VAS for decreased pain levels and ease with functional activities.    Baseline  10/23: 6/10 11/19: 5/10 12/12 9/10 1/10: 7/10 2/11: 3/10     Time  4    Period  Weeks    Status  Partially Met    Target Date  11/19/18      PT LONG TERM GOAL #2   Title  Patient will be able to ambulate on treadmill for 26mnutes without increase in back/leg pain demonstrating improved community ambulation and treadmill walking at pulmonary rehab    Baseline  10/23: 0162mutes increases pain 11/19: 62m22mtes 12/12: has not had the chance to perform 1/10: was able to do 5 minutes without pain 2/11: able to do 5 minutes then had to bend over    Time  4    Period   Weeks    Status  Partially Met    Target Date  11/19/18      PT LONG TERM GOAL #3   Title  Patient will have decreased modified oswestry score <50% for improved ability to complete household tasks with less pain levels.  Baseline  10/23: 62% 11/19: 42%    Time  4    Period  Weeks    Status  Achieved      PT LONG TERM GOAL #4   Title  Patient will score 4+/5 on BLE MMT to demonstrate improved glute strength and ability to carry groceries with increased ease up/down stairs at home.    Baseline  10/23: 4-/5 grossly 11/19: 4/5 grossly; hamstrings 4-/5 12/12: 4/5 gross 1/10: 4/5 gross 2/11: gross 4+/5 with hamstrings and abuductors 4-/5    Time  4    Period  Weeks    Status  Partially Met    Target Date  11/19/18      PT LONG TERM GOAL #5   Title  Patient will have decreased modified oswestry score <35% for improved ability to complete household tasks with less pain levels.     Baseline  11/19: 42% 12/12: 52% 1/10: 48%  2/11: 30%     Time  4    Period  Weeks    Status  Achieved      Additional Long Term Goals   Additional Long Term Goals  Yes      PT LONG TERM GOAL #6   Title  Patient will be able to make and/or strip the bed without pain and without needing rest breaks to perform iADLs independently.     Baseline  2/11: pain limits ability to perform, ~4 breaks required    Time  4    Period  Weeks    Status  New    Target Date  11/19/18      PT LONG TERM GOAL #7   Title  Patient will have decreased modified oswestry score <20% for improved ability to complete household tasks with less pain levels.     Baseline  2/11: 30%     Time  4    Period  Weeks    Status  New    Target Date  11/19/18            Plan - 11/07/18 1531    Clinical Impression Statement  Patient presents with increased fatigue from moving yard equipment and Nustep for 30 minutes. Focus on core and education on stability performed. Improved cross body coordination noted with supine interventions.  Patient having more fatigue noted on upper lumbar region that was improved with use of TENS while performing interventions. Patient will continue to benefit from skilled physical therapy to improve strength, reduce pain levels with ADLs, and to improve overall function and quality of life    Rehab Potential  Fair    Clinical Impairments Affecting Rehab Potential  (+) motivation (-) chronicity of symptoms    PT Frequency  2x / week    PT Duration  4 weeks    PT Treatment/Interventions  ADLs/Self Care Home Management;Cryotherapy;Moist Heat;Traction;Iontophoresis 16m/ml Dexamethasone;Ultrasound;Gait training;Stair training;Functional mobility training;Neuromuscular re-education;Balance training;Therapeutic exercise;Therapeutic activities;Patient/family education;Manual techniques;Energy conservation;Aquatic Therapy;Electrical Stimulation;Passive range of motion;Dry needling;Taping    PT Next Visit Plan  cross body coordination, core/postural stability     PT Home Exercise Plan  see sheet    Consulted and Agree with Plan of Care  Patient       Patient will benefit from skilled therapeutic intervention in order to improve the following deficits and impairments:  Abnormal gait, Cardiopulmonary status limiting activity, Decreased activity tolerance, Decreased balance, Decreased endurance, Decreased range of motion, Difficulty walking, Decreased strength, Hypomobility, Decreased mobility, Impaired perceived functional ability, Impaired flexibility, Postural dysfunction, Obesity,  Improper body mechanics, Pain  Visit Diagnosis: Chronic low back pain, unspecified back pain laterality, unspecified whether sciatica present  Abnormal posture  Muscle weakness (generalized)  Other abnormalities of gait and mobility     Problem List Patient Active Problem List   Diagnosis Date Noted  . DDD (degenerative disc disease), lumbosacral 10/10/2018  . Abnormal MRI, lumbar spine (08/29/2018) 10/10/2018  .  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 Lumbar nerve root impingement (Bilateral: L4 & L5) (L>R: L5) 10/10/2018  . Lumbar Spinal instability of L4-5 10/10/2018  . Lumbar facet hypertrophy 10/10/2018  . COPD (chronic obstructive pulmonary disease) with emphysema ([place]) 08/28/2018  . Elevated rheumatoid factor 08/13/2018  . Elevated C-reactive protein (CRP) 08/12/2018  . Elevated sed rate 08/12/2018  . Lumbar facet syndrome (Bilateral) 08/12/2018  . Back pain with left-sided radiculopathy 08/12/2018  . DDD (degenerative disc disease), lumbar 08/12/2018  . Chronic lower extremity pain (Primary Area of Pain) (Bilateral) (L>R) 07/23/2018  . Chronic low back pain (Secondary Area of Pain) (Bilateral) (L>R) w/ sciatica (Bilateral) 07/23/2018  . Chronic pain syndrome 07/23/2018  . Pharmacologic therapy 07/23/2018  . Disorder of skeletal system 07/23/2018  . Problems influencing health status 07/23/2018  . Chronic sacroiliac joint pain [facility] Area of Pain) (Bilateral) (L>R) 07/23/2018  . Status post lumboperitoneal shunt placement 02/26/2018  . 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 Tympanic membrane perforation 05/30/2016  . PMB (postmenopausal bleeding) 04/26/2016  . Rash 12/27/2015  . 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 Plantar fasciitis of foot (Left) 05/26/2015  . Spondylosis of lumbar region without myelopathy or radiculopathy 05/26/2015  . Lumbar foraminal stenosis (L3-4, L4-5, L5-S1) (L>R) 05/26/2015  . Spinal stenosis of lumbar region with neurogenic claudication 05/26/2015  . Encounter for screening mammogram for malignant neoplasm of breast 12/02/2014  . Goiter 12/02/2014  . Chronic idiopathic gout of foot (Right) 07/06/2014  . Biceps tendonitis (Right) 05/19/2014  . Shoulder pain (Right) 05/19/2014  . Facial weakness 05/05/2014  . Post herpetic neuralgia  05/05/2014  . Chronic dysfunction of left eustachian tube 03/24/2014  . Osteopenia 03/13/2014  . Lumbar Grade 1 Anterolisthesis of L3/4 (73m) & L4/5 (4-130m(w/ Dynamic instability) 03/13/2014  . Family history of colon cancer 12/02/2013  . Dyshidrotic eczema 11/17/2013  . Diastasis recti 04/08/2013  . Facial nerve paresis 07/31/2012  . Blepharospasm 05/08/2012  . 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 Obesity 03/28/2012  . Hemoptysis 12/22/2011  . Neuropathic postherpetic trigeminal neuralgia 12/12/2011  . Allergic rhinitis, mild 10/12/2011  . Disequilibrium syndrome 09/27/2011  . Chronic bronchitis ([facility]09/24/2012  . Essential hypertension 05/24/2011  . Type 2 diabetes mellitus without complications ([id number]. Pulmonary nodules 05/05/2011   MaJanna ArchPT, DPT    11/07/2018, 3:57 PM  [place] REA[facility] HospitalERVICES [address]dNorth[place]hone: [phone] Fax:  [phone]: Susan HodzicRN: [id number] of Birth: 02/1951/12/21

## 2018-11-12 ENCOUNTER — Ambulatory Visit: Payer: Medicare Other | Attending: Rehabilitation | Admitting: Physical Therapy

## 2018-11-12 ENCOUNTER — Encounter: Payer: Self-pay | Admitting: Physical Therapy

## 2018-11-12 DIAGNOSIS — R293 Abnormal posture: Secondary | ICD-10-CM

## 2018-11-12 DIAGNOSIS — R2689 Other abnormalities of gait and mobility: Secondary | ICD-10-CM

## 2018-11-12 DIAGNOSIS — M6281 Muscle weakness (generalized): Secondary | ICD-10-CM | POA: Diagnosis present

## 2018-11-12 DIAGNOSIS — M545 Low back pain, unspecified: Secondary | ICD-10-CM

## 2018-11-12 DIAGNOSIS — G8929 Other chronic pain: Secondary | ICD-10-CM | POA: Diagnosis present

## 2018-11-12 NOTE — Patient Instructions (Signed)
Tips for Gardening   For raised beds, remember the following tools:  1. 50 % engagement of abdominals with exhalation  2. Stick your bum back (tailbone straight back) and hinge from the hips keeping your spine flat.   3. Gently bend the knees to a depth where you can reach into the raised bed.   4. When you finish the task, exhale and engage the abdominals.  5. Then press feet into the ground and squeeze glutes to come up to standing with back flat.   For raking/hoeing, keep the arms close to your center for less demand on your back.

## 2018-11-14 ENCOUNTER — Ambulatory Visit: Payer: Medicare Other

## 2018-11-14 DIAGNOSIS — M6281 Muscle weakness (generalized): Secondary | ICD-10-CM

## 2018-11-14 DIAGNOSIS — M545 Low back pain, unspecified: Secondary | ICD-10-CM

## 2018-11-14 DIAGNOSIS — R293 Abnormal posture: Secondary | ICD-10-CM

## 2018-11-14 DIAGNOSIS — R2689 Other abnormalities of gait and mobility: Secondary | ICD-10-CM

## 2018-11-14 DIAGNOSIS — G8929 Other chronic pain: Secondary | ICD-10-CM

## 2018-11-14 NOTE — Therapy (Addendum)
[place] MAIN [facility] SERVICES [address] [place], [place], [address] Phone: [phone]   Fax:  [phone]  Physical Therapy Treatment  Patient Details  Name: Susan Callahan MRN: [id number] Date of Birth: 11-25-1950 Referring Provider (PT): Francesco Sor   Encounter Date: 11/14/2018  PT End of Session - 11/14/18 1612    Visit Number  27    Number of Visits  30    Date for PT Re-Evaluation  11/19/18    Authorization Type  8/10 starting 1/29    PT Start Time  1431    PT Stop Time  1513    PT Time Calculation (min)  42 min    Activity Tolerance  Patient tolerated treatment well    Behavior During Therapy  [facility] for tasks assessed/performed       Past Medical History:  Diagnosis Date  . COPD (chronic obstructive pulmonary disease) ([place])   . Depression 1974  . Diabetes mellitus without complication ([place])   . Hypertension   . Migraine   . 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 LUMBAR PERITONEAL SHUNT    . TYMPANOSTOMY TUBE PLACEMENT      There were no vitals filed for this visit.  Subjective Assessment - 11/14/18 1607    Subjective  Patient reports that she is doing OK today. She notes that she is having increased back and LLE pain today because she went to a concert last night and did a lot of walking and prolonged standing and sitting. She has been consistently performing her HEP. She has not been to the gym much this week, but plans on gardening over the weekend and expresses her excitement in trying to incorporate techniques that she has learned in PT.    Pertinent History  Pain began in 2007 and has gradually got worse and has progressed to nonstop. Patient states pain last all day long, everyday with no change in symptoms. Medication helps briefly. States she has difficulty with showering and standing. States she has to stand with her knee bent due to pulling sensation in  legs. Pain begins immediately when stands and begins to walk. Pain feels better when she bends her knees when standing. Pain became constant and non stop around Dec 2018. Denies bowel or bladder changes, unexplained weakness, or unexpected weight loss/gain. She currently goes to pulmonary rehab 3x a week, but has increased pain with treadmill walking. Will walk with cane on occasion around house especially at night due to occasional dizziness and vision impairments in medical history. States she also has difficulty with carrying groceries up steps due to low back pain. She would like to be able to walk better and have less pain to be less reliant on medication.     Limitations  Lifting;Standing;Walking;House hold activities    How long can you stand comfortably?  pain begins immediately     How long can you walk comfortably?  pain begins immediately     Patient Stated Goals  walk better, have less pain    Currently in Pain?  Yes    Pain Score  5     Pain Location  Leg    Pain Orientation  Left    Pain Descriptors / Indicators  Aching;Sore    Pain Type  Chronic pain    Pain Onset  More than a month ago    Pain Frequency  Intermittent  Manual Therapy: -Supine hamstring stretch 1x60 seconds each leg -Supine hamstring stretch with dorsiflexion/plantarflexion for nerve glide 1x60 seconds each leg -Lumbar rotations x10 each side for low back stretching and stiffness -Roller to piriformis x90 seconds to increase muscle tissue length and reduce pain -Roller to lumbar and thoracic paraspinals x2 minutes to increase muscle tissue length and reduce pain.     TherEx:verbal and tactile cueing for body mechanics and sequencing of muscle activation for optimal progression.   -Supine pelvic tilts x10 for low back stretch  -Supine SLR with TrA activation x10 each LE  -Supine alternating marches with TrA activation x15 each LE  -Seated shoulder extensions/lat pull downs RTB x10  -Seated paloff  press RTB 15x each LE; patient benefits from verbal cueing for breathing   -Seated physioball stretch forward x10 with 5 second holds  -Piriformis stretch in supine x60 seconds each LE                    PT Education - 11/14/18 1611    Education Details  exercise technique, rest, core stability, posture    Person(s) Educated  Patient    Methods  Explanation;Demonstration;Verbal cues    Comprehension  Verbalized understanding;Returned demonstration;Verbal cues required;Need further instruction       PT Short Term Goals - 10/22/18 1616      PT SHORT TERM GOAL #1   Title  Patient will be independent with completion of HEP to improve ability to complete functional tasks and functional ADLs.    Baseline  10/23: will give HEP next session 11/19: HEP compliance    Time  2    Period  Weeks    Status  Achieved      PT SHORT TERM GOAL #2   Title  Patient will report pain score less than 4/10 on VAS to demonstrate reduction of pain levels with functional tasks.     Baseline  10/23: 6/10 11/19: 5/10 12/12: 9/10 1/10: 7/10  2/11: 3/10     Time  2    Period  Weeks    Status  Achieved        PT Long Term Goals - 10/22/18 1616      PT LONG TERM GOAL #1   Title  Patient will report pain score of <3/10 on VAS for decreased pain levels and ease with functional activities.    Baseline  10/23: 6/10 11/19: 5/10 12/12 9/10 1/10: 7/10 2/11: 3/10     Time  4    Period  Weeks    Status  Partially Met    Target Date  11/19/18      PT LONG TERM GOAL #2   Title  Patient will be able to ambulate on treadmill for 30mnutes without increase in back/leg pain demonstrating improved community ambulation and treadmill walking at pulmonary rehab    Baseline  10/23: 056mutes increases pain 11/19: 10m37mtes 12/12: has not had the chance to perform 1/10: was able to do 5 minutes without pain 2/11: able to do 5 minutes then had to bend over    Time  4    Period  Weeks    Status  Partially Met     Target Date  11/19/18      PT LONG TERM GOAL #3   Title  Patient will have decreased modified oswestry score <50% for improved ability to complete household tasks with less pain levels.     Baseline  10/23: 62% 11/19: 42%  Time  4    Period  Weeks    Status  Achieved      PT LONG TERM GOAL #4   Title  Patient will score 4+/5 on BLE MMT to demonstrate improved glute strength and ability to carry groceries with increased ease up/down stairs at home.    Baseline  10/23: 4-/5 grossly 11/19: 4/5 grossly; hamstrings 4-/5 12/12: 4/5 gross 1/10: 4/5 gross 2/11: gross 4+/5 with hamstrings and abuductors 4-/5    Time  4    Period  Weeks    Status  Partially Met    Target Date  11/19/18      PT LONG TERM GOAL #5   Title  Patient will have decreased modified oswestry score <35% for improved ability to complete household tasks with less pain levels.     Baseline  11/19: 42% 12/12: 52% 1/10: 48%  2/11: 30%     Time  4    Period  Weeks    Status  Achieved      Additional Long Term Goals   Additional Long Term Goals  Yes      PT LONG TERM GOAL #6   Title  Patient will be able to make and/or strip the bed without pain and without needing rest breaks to perform iADLs independently.     Baseline  2/11: pain limits ability to perform, ~4 breaks required    Time  4    Period  Weeks    Status  New    Target Date  11/19/18      PT LONG TERM GOAL #7   Title  Patient will have decreased modified oswestry score <20% for improved ability to complete household tasks with less pain levels.     Baseline  2/11: 30%     Time  4    Period  Weeks    Status  New    Target Date  11/19/18            Plan - 11/14/18 1622    Clinical Impression Statement  Patient has increased pain levels today from a long night of walking and prolonged sitting and standing at a concert yesterday. The patient acknowledges reduced pain by the end of session with and demonstrates improved muscle tissue lengthening  and core stability following the performance of manual therapy and exercise. She is able exhibit appropriate core stabilization with rare verbal cues, which is an improvement from previous sessions. Patient will continue to benefit from skilled physical therapy to improve strength, to reduce pain levels with ADLs, and to improve quality of life.    Rehab Potential  Fair    Clinical Impairments Affecting Rehab Potential  (+) motivation (-) chronicity of symptoms    PT Frequency  2x / week    PT Duration  4 weeks    PT Treatment/Interventions  ADLs/Self Care Home Management;Cryotherapy;Moist Heat;Traction;Iontophoresis 18m/ml Dexamethasone;Ultrasound;Gait training;Stair training;Functional mobility training;Neuromuscular re-education;Balance training;Therapeutic exercise;Therapeutic activities;Patient/family education;Manual techniques;Energy conservation;Aquatic Therapy;Electrical Stimulation;Passive range of motion;Dry needling;Taping    PT Next Visit Plan  cross body coordination, progress core/postural stability, progress BLE strengthening    PT Home Exercise Plan  see instructions section from previous notes    Consulted and Agree with Plan of Care  Patient       Patient will benefit from skilled therapeutic intervention in order to improve the following deficits and impairments:  Abnormal gait, Cardiopulmonary status limiting activity, Decreased activity tolerance, Decreased balance, Decreased endurance, Decreased range of motion, Difficulty walking,  Decreased strength, Hypomobility, Decreased mobility, Impaired perceived functional ability, Impaired flexibility, Postural dysfunction, Obesity, Improper body mechanics, Pain  Visit Diagnosis: Chronic low back pain, unspecified back pain laterality, unspecified whether sciatica present  Abnormal posture  Muscle weakness (generalized)  Other abnormalities of gait and mobility     Problem List Patient Active Problem List   Diagnosis Date  Noted  . DDD (degenerative disc disease), lumbosacral 10/10/2018  . Abnormal MRI, lumbar spine (08/29/2018) 10/10/2018  . 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 Lumbar nerve root impingement (Bilateral: L4 & L5) (L>R: L5) 10/10/2018  . Lumbar Spinal instability of L4-5 10/10/2018  . Lumbar facet hypertrophy 10/10/2018  . COPD (chronic obstructive pulmonary disease) with emphysema ([place]) 08/28/2018  . Elevated rheumatoid factor 08/13/2018  . Elevated C-reactive protein (CRP) 08/12/2018  . Elevated sed rate 08/12/2018  . Lumbar facet syndrome (Bilateral) 08/12/2018  . Back pain with left-sided radiculopathy 08/12/2018  . DDD (degenerative disc disease), lumbar 08/12/2018  . Chronic lower extremity pain (Primary Area of Pain) (Bilateral) (L>R) 07/23/2018  . Chronic low back pain (Secondary Area of Pain) (Bilateral) (L>R) w/ sciatica (Bilateral) 07/23/2018  . Chronic pain syndrome 07/23/2018  . Pharmacologic therapy 07/23/2018  . Disorder of skeletal system 07/23/2018  . Problems influencing health status 07/23/2018  . Chronic sacroiliac joint pain [facility] Area of Pain) (Bilateral) (L>R) 07/23/2018  . Status post lumboperitoneal shunt placement 02/26/2018  . 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 Tympanic membrane perforation 05/30/2016  . PMB (postmenopausal bleeding) 04/26/2016  . Rash 12/27/2015  . 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 Plantar fasciitis of foot (Left) 05/26/2015  . Spondylosis of lumbar region without myelopathy or radiculopathy 05/26/2015  . Lumbar foraminal stenosis (L3-4, L4-5, L5-S1) (L>R) 05/26/2015  . Spinal stenosis of lumbar region with neurogenic claudication 05/26/2015  . Encounter for screening mammogram for malignant neoplasm of breast 12/02/2014  . Goiter 12/02/2014  . Chronic idiopathic gout of foot (Right) 07/06/2014  . Biceps  tendonitis (Right) 05/19/2014  . Shoulder pain (Right) 05/19/2014  . Facial weakness 05/05/2014  . Post herpetic neuralgia 05/05/2014  . Chronic dysfunction of left eustachian tube 03/24/2014  . Osteopenia 03/13/2014  . Lumbar Grade 1 Anterolisthesis of L3/4 (47m) & L4/5 (4-140m(w/ Dynamic instability) 03/13/2014  . Family history of colon cancer 12/02/2013  . Dyshidrotic eczema 11/17/2013  . Diastasis recti 04/08/2013  . Facial nerve paresis 07/31/2012  . Blepharospasm 05/08/2012  . 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 Obesity 03/28/2012  . Hemoptysis 12/22/2011  . Neuropathic postherpetic trigeminal neuralgia 12/12/2011  . Allergic rhinitis, mild 10/12/2011  . Disequilibrium syndrome 09/27/2011  . Chronic bronchitis ([facility]09/24/2012  . Essential hypertension 05/24/2011  . Type 2 diabetes mellitus without complications ([id number]. Pulmonary nodules 05/05/2011   ZaOrlean PattenSPT  This entire session was performed under direct supervision and direction of a licensed therapist/therapist assistant . I have personally read, edited and approve of the note as written.  MaJanna ArchPT, DPT   11/14/2018, 4:38 PM  [place] [facility] [id number]: [phone] Fax:  [phone]: Susan HaseRN: [id number] of Birth: 6/Aug 26, 1951

## 2018-11-14 NOTE — Therapy (Signed)
[place] [facility] SERVICES [address] [place], [place], [address] Phone: [phone]   Fax:  [phone]  Physical Therapy Treatment  Patient Details  Name: Susan Callahan MRN: [id number] Date of Birth: [birth year] Referring Provider (PT): Francesco Sor   Encounter Date: 11/12/2018    PT End of Session - 11/12/18 1530    Visit Number  26   Number of Visits  30    Date for PT Re-Evaluation  11/19/18    Authorization Type  7/10 starting 1/29    PT Start Time  1520    PT Stop Time  1600   PT Time Calculation (min)  40 min    Equipment Utilized During Treatment  Gait belt    Activity Tolerance  Patient tolerated treatment well    Behavior During Therapy  [facility] for tasks assessed/performed      Past Medical History:  Diagnosis Date  . COPD (chronic obstructive pulmonary disease) ([place])   . Depression 1974  . Diabetes mellitus without complication ([place])   . Hypertension   . Migraine   . 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 LUMBAR PERITONEAL SHUNT    . TYMPANOSTOMY TUBE PLACEMENT      There were no vitals filed for this visit.   Subjective Patient notes that her L leg cramped up this morning and it hadn't done that in awhile. Patient states that the positioning makes it cramp; she forced a passive stretch which eventually made it release.   Pain Currently? 3/10 Location? L thoracolumbar jxn Quality? Aching, sore Radiating? Back LLE  TREATMENT  Pre-treatment assessment: increased muscle tone and tenderness to L paraspinals at thoracolumbar region.   Manual Therapy: Supine hamstring stretch 1x60 seconds each leg Supine popliteal angle stretch 1x60 seconds each leg Supine hamstring stretch with dorsiflexion/plantarflexion for nerve glide 1x60 seconds each leg L Sidelying, L UPA T7-T12 mobilizations with movement (open-book) with diaphragmatic  breathing  Neuromuscular Re-education: Supine supported hooklying diaphragmatic breathing for pain modulation and in preparation for abdominal activation Supine TrA activation with exhalation x10, VCs for 50% contraction to reduce compensations from glutes and over-stretching paraspinals Sidelying TrA activation with exhalation x15, VCs for 50% contraction with exhalation  Patient and PT collaborated on a plan to incorporate glute strengthening and core activation during pleasurable activities (gardening) as well as obligatory tasks. Patient provided with written guidance on principles of hip hinge with core activation, as well as concepts of shortening lever arms to prevent strain on back and creating balanced movement with raking/hoeing (varying sides, short strokes, exhalation for improved abdominal engagement).   Post-treatment assessment: More symmetrical tone with no tenderness reported at thoracolumbar region.  Patient educated throughout session on appropriate technique and form using multi-modal cueing, HEP, and activity modification. Patient articulated understanding and returned demonstration.  Patient Response to interventions: Patient reported feeling confident with the plan to practice her body awareness, core activation, and foundational movement patterns/postures (hip hinge, squat) during pleasurable activities such as gardening.  ASSESSMENT Patient presents to clinic with excellent motivation to participate in therapy. Patient demonstrates deficits in pain, strength, posture, ROM, and mobility as evidenced by slumped sitting posture, decreased endurance in postural muscles (TrA specifically), and presence of compensatory patterns during transitional postures and tasks. Patient able to achieve increased thoracic spine R rotational mobility and concepts of core support with hip hinge for gardening during today's session and responded positively to manual  interventions. Patient will  benefit from continued skilled therapeutic intervention to address remaining deficits in pain, posture, strength, and mobility in order to participate fully in ADLs with decreased pain, increase function, and improve overall QOL.    PT Short Term Goals - 10/22/18 1616      PT SHORT TERM GOAL #1   Title  Patient will be independent with completion of HEP to improve ability to complete functional tasks and functional ADLs.    Baseline  10/23: will give HEP next session 11/19: HEP compliance    Time  2    Period  Weeks    Status  Achieved      PT SHORT TERM GOAL #2   Title  Patient will report pain score less than 4/10 on VAS to demonstrate reduction of pain levels with functional tasks.     Baseline  10/23: 6/10 11/19: 5/10 12/12: 9/10 1/10: 7/10  2/11: 3/10     Time  2    Period  Weeks    Status  Achieved        PT Long Term Goals - 10/22/18 1616      PT LONG TERM GOAL #1   Title  Patient will report pain score of <3/10 on VAS for decreased pain levels and ease with functional activities.    Baseline  10/23: 6/10 11/19: 5/10 12/12 9/10 1/10: 7/10 2/11: 3/10     Time  4    Period  Weeks    Status  Partially Met    Target Date  11/19/18      PT LONG TERM GOAL #2   Title  Patient will be able to ambulate on treadmill for 49mnutes without increase in back/leg pain demonstrating improved community ambulation and treadmill walking at pulmonary rehab    Baseline  10/23: 087mutes increases pain 11/19: 25m41mtes 12/12: has not had the chance to perform 1/10: was able to do 5 minutes without pain 2/11: able to do 5 minutes then had to bend over    Time  4    Period  Weeks    Status  Partially Met    Target Date  11/19/18      PT LONG TERM GOAL #3   Title  Patient will have decreased modified oswestry score <50% for improved ability to complete household tasks with less pain levels.     Baseline  10/23: 62% 11/19: 42%    Time  4    Period  Weeks    Status  Achieved      PT LONG  TERM GOAL #4   Title  Patient will score 4+/5 on BLE MMT to demonstrate improved glute strength and ability to carry groceries with increased ease up/down stairs at home.    Baseline  10/23: 4-/5 grossly 11/19: 4/5 grossly; hamstrings 4-/5 12/12: 4/5 gross 1/10: 4/5 gross 2/11: gross 4+/5 with hamstrings and abuductors 4-/5    Time  4    Period  Weeks    Status  Partially Met    Target Date  11/19/18      PT LONG TERM GOAL #5   Title  Patient will have decreased modified oswestry score <35% for improved ability to complete household tasks with less pain levels.     Baseline  11/19: 42% 12/12: 52% 1/10: 48%  2/11: 30%     Time  4    Period  Weeks    Status  Achieved      Additional Long Term Goals  Additional Long Term Goals  Yes      PT LONG TERM GOAL #6   Title  Patient will be able to make and/or strip the bed without pain and without needing rest breaks to perform iADLs independently.     Baseline  2/11: pain limits ability to perform, ~4 breaks required    Time  4    Period  Weeks    Status  New    Target Date  11/19/18      PT LONG TERM GOAL #7   Title  Patient will have decreased modified oswestry score <20% for improved ability to complete household tasks with less pain levels.     Baseline  2/11: 30%     Time  4    Period  Weeks    Status  New    Target Date  11/19/18         Plan - 11/14/18 1117    Clinical Impression Statement  Patient presents to clinic with excellent motivation to participate in therapy. Patient demonstrates deficits in pain, strength, posture, ROM, and mobility as evidenced by slumped sitting posture, decreased endurance in postural muscles (TrA specifically), and presence of compensatory patterns during transitional postures and tasks. Patient able to achieve increased thoracic spine R rotational mobility and concepts of core support with hip hinge for gardening during today's session and responded positively to manual interventions. Patient will  benefit from continued skilled therapeutic intervention to address remaining deficits in pain, posture, strength, and mobility in order to participate fully in ADLs with decreased pain, increase function, and improve overall QOL.    Rehab Potential  Fair    Clinical Impairments Affecting Rehab Potential  (+) motivation (-) chronicity of symptoms    PT Frequency  2x / week    PT Duration  4 weeks    PT Treatment/Interventions  ADLs/Self Care Home Management;Cryotherapy;Moist Heat;Traction;Iontophoresis 63m/ml Dexamethasone;Ultrasound;Gait training;Stair training;Functional mobility training;Neuromuscular re-education;Balance training;Therapeutic exercise;Therapeutic activities;Patient/family education;Manual techniques;Energy conservation;Aquatic Therapy;Electrical Stimulation;Passive range of motion;Dry needling;Taping    PT Next Visit Plan  cross body coordination, core/postural stability     PT Home Exercise Plan  see sheet    Consulted and Agree with Plan of Care  Patient         Patient will benefit from skilled therapeutic intervention in order to improve the following deficits and impairments:  Abnormal gait, Cardiopulmonary status limiting activity, Decreased activity tolerance, Decreased balance, Decreased endurance, Decreased range of motion, Difficulty walking, Decreased strength, Hypomobility, Decreased mobility, Impaired perceived functional ability, Impaired flexibility, Postural dysfunction, Obesity, Improper body mechanics, Pain  Visit Diagnosis: Chronic low back pain, unspecified back pain laterality, unspecified whether sciatica present  Abnormal posture  Muscle weakness (generalized)  Other abnormalities of gait and mobility     Problem List Patient Active Problem List   Diagnosis Date Noted  . DDD (degenerative disc disease), lumbosacral 10/10/2018  . Abnormal MRI, lumbar spine (08/29/2018) 10/10/2018  . 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 Lumbar nerve root impingement (Bilateral: L4 & L5) (L>R: L5) 10/10/2018  . Lumbar Spinal instability of L4-5 10/10/2018  . Lumbar facet hypertrophy 10/10/2018  . COPD (chronic obstructive pulmonary disease) with emphysema ([place] 08/28/2018  . Elevated rheumatoid factor 08/13/2018  . Elevated C-reactive protein (CRP) 08/12/2018  . Elevated sed rate 08/12/2018  . Lumbar facet syndrome (Bilateral) 08/12/2018  . Back pain with left-sided radiculopathy 08/12/2018  . DDD (degenerative disc disease), lumbar 08/12/2018  . Chronic lower extremity  pain (Primary Area of Pain) (Bilateral) (L>R) 07/23/2018  . Chronic low back pain (Secondary Area of Pain) (Bilateral) (L>R) w/ sciatica (Bilateral) 07/23/2018  . Chronic pain syndrome 07/23/2018  . Pharmacologic therapy 07/23/2018  . Disorder of skeletal system 07/23/2018  . Problems influencing health status 07/23/2018  . Chronic sacroiliac joint pain [facility] Area of Pain) (Bilateral) (L>R) 07/23/2018  . Status post lumboperitoneal shunt placement 02/26/2018  . 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 Tympanic membrane perforation 05/30/2016  . PMB (postmenopausal bleeding) 04/26/2016  . Rash 12/27/2015  . 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 Plantar fasciitis of foot (Left) 05/26/2015  . Spondylosis of lumbar region without myelopathy or radiculopathy 05/26/2015  . Lumbar foraminal stenosis (L3-4, L4-5, L5-S1) (L>R) 05/26/2015  . Spinal stenosis of lumbar region with neurogenic claudication 05/26/2015  . Encounter for screening mammogram for malignant neoplasm of breast 12/02/2014  . Goiter 12/02/2014  . Chronic idiopathic gout of foot (Right) 07/06/2014  . Biceps tendonitis (Right) 05/19/2014  . Shoulder pain (Right) 05/19/2014  . Facial weakness 05/05/2014  . Post herpetic neuralgia 05/05/2014  . Chronic dysfunction of left eustachian tube  03/24/2014  . Osteopenia 03/13/2014  . Lumbar Grade 1 Anterolisthesis of L3/4 (84m) & L4/5 (4-132m(w/ Dynamic instability) 03/13/2014  . Family history of colon cancer 12/02/2013  . Dyshidrotic eczema 11/17/2013  . Diastasis recti 04/08/2013  . Facial nerve paresis 07/31/2012  . Blepharospasm 05/08/2012  . 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 Obesity 03/28/2012  . Hemoptysis 12/22/2011  . Neuropathic postherpetic trigeminal neuralgia 12/12/2011  . Allergic rhinitis, mild 10/12/2011  . Disequilibrium syndrome 09/27/2011  . Chronic bronchitis ([place]/24/2012  . Essential hypertension 05/24/2011  . Type 2 diabetes mellitus without complications ([place]CM[place]0953/61/4431. Pulmonary nodules 05/05/2011   KaMyles GipT, DPT #[id number]/01/2019, 9:06 AM  [place] REThe [facility] FranklinERVICES [address]dStauntonNC[place]hone: [phone] Fax:  [phone]: Susan KoglerRN: [id number] of Birth: 6/December 28, 1950

## 2018-11-18 ENCOUNTER — Ambulatory Visit: Payer: Medicare Other

## 2018-11-18 DIAGNOSIS — G8929 Other chronic pain: Secondary | ICD-10-CM

## 2018-11-18 DIAGNOSIS — M545 Low back pain: Secondary | ICD-10-CM | POA: Diagnosis not present

## 2018-11-18 DIAGNOSIS — M6281 Muscle weakness (generalized): Secondary | ICD-10-CM

## 2018-11-18 DIAGNOSIS — R2689 Other abnormalities of gait and mobility: Secondary | ICD-10-CM

## 2018-11-18 DIAGNOSIS — R293 Abnormal posture: Secondary | ICD-10-CM

## 2018-11-18 NOTE — Therapy (Signed)
[place] MAIN [facility] SERVICES [address] [place], [place], [address] Phone: [phone]   Fax:  [phone]  Physical Therapy Treatment/ RECERT  Patient Details  Name: Susan Callahan MRN: [id number] Date of Birth: 02-11-1951 Referring Provider (PT): Francesco Sor   Encounter Date: 11/18/2018  PT End of Session - 11/18/18 1137    Visit Number  28    Number of Visits  36    Date for PT Re-Evaluation  12/16/18    Authorization Type  9/10 starting 1/29    PT Start Time  1126    PT Stop Time  1200    PT Time Calculation (min)  34 min    Activity Tolerance  Patient tolerated treatment well    Behavior During Therapy  [facility] for tasks assessed/performed       Past Medical History:  Diagnosis Date  . COPD (chronic obstructive pulmonary disease) ([place])   . Depression 1974  . Diabetes mellitus without complication ([place])   . Hypertension   . Migraine   . 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 LUMBAR PERITONEAL SHUNT    . TYMPANOSTOMY TUBE PLACEMENT      There were no vitals filed for this visit.  Subjective Assessment - 11/18/18 1135    Subjective  Patient reports she had a very busy week. Did lawn work/gardening yesterday and was aware of her body positioning. Put together a box spring since she was seen last week. Is feeling more sore today but pain is 3/10     Pertinent History  Pain began in 2007 and has gradually got worse and has progressed to nonstop. Patient states pain last all day long, everyday with no change in symptoms. Medication helps briefly. States she has difficulty with showering and standing. States she has to stand with her knee bent due to pulling sensation in legs. Pain begins immediately when stands and begins to walk. Pain feels better when she bends her knees when standing. Pain became constant and non stop around Dec 2018. Denies bowel or bladder changes,  unexplained weakness, or unexpected weight loss/gain. She currently goes to pulmonary rehab 3x a week, but has increased pain with treadmill walking. Will walk with cane on occasion around house especially at night due to occasional dizziness and vision impairments in medical history. States she also has difficulty with carrying groceries up steps due to low back pain. She would like to be able to walk better and have less pain to be less reliant on medication.     Limitations  Lifting;Standing;Walking;House hold activities    How long can you stand comfortably?  pain begins immediately     How long can you walk comfortably?  pain begins immediately     Patient Stated Goals  walk better, have less pain    Currently in Pain?  Yes    Pain Score  3     Pain Location  Back    Pain Orientation  Lower;Mid    Pain Descriptors / Indicators  Aching;Sore    Pain Type  Chronic pain    Pain Onset  More than a month ago    Pain Frequency  Intermittent         Patient did lawn work/gardening yesterday and was aware of body positioning. Is feeling more sore today but pain is 3/10.    RECERT Goal review:   VAS: 6/10 from doing too  much the week, carrying kitty litter, going to concert, lawn work. Is slightly higher in back.  Has the bad pain every other day now not everyday anymore.  Walk on treadmill : 2.0 mph 0 % incline. 5 minutes and 30 seconds prior to symptom aggravation:  Make bed: able to perform with only 3 rest breaks.  MODI: 30%  TREAT:    Manual Therapy: -Supine hamstring stretch 1x60 seconds each leg -Supine hamstring stretch with dorsiflexion/plantarflexion for nerve glide 1x60 seconds each leg -Lumbar rotations x10 each side for low back stretching and stiffness -Rollertopiriformis x90 seconds to increase muscle tissue length and reduce pain -Roller to lumbar and thoracic paraspinals x45mnutesto increase muscle tissue length and reduce pain.       TherEx:verbal and  tactile cueing for body mechanics and sequencing of muscle activation for optimal progression.   -Supine pelvic tilts x10 for low back stretch        Patient's condition has the potential to improve in response to therapy. Maximum improvement is yet to be obtained. The anticipated improvement is attainable and reasonable in a generally predictable time.  Patient reports she is able to perform more household and yard tasks with decreasing need for breaks. Is becoming more aware of posture during exercise but still feels limited.          PT Education - 11/18/18 1137    Education Details  goals, POC,     Person(s) Educated  Patient    Methods  Explanation    Comprehension  Verbalized understanding       PT Short Term Goals - 11/18/18 1226      PT SHORT TERM GOAL #1   Title  Patient will be independent with completion of HEP to improve ability to complete functional tasks and functional ADLs.    Baseline  10/23: will give HEP next session 11/19: HEP compliance    Time  2    Period  Weeks    Status  Achieved      PT SHORT TERM GOAL #2   Title  Patient will report pain score less than 4/10 on VAS to demonstrate reduction of pain levels with functional tasks.     Baseline  10/23: 6/10 11/19: 5/10 12/12: 9/10 1/10: 7/10  2/11: 3/10     Time  2    Period  Weeks    Status  Achieved        PT Long Term Goals - 11/18/18 1131      PT LONG TERM GOAL #1   Title  Patient will report pain score of <3/10 on VAS for decreased pain levels and ease with functional activities.    Baseline  10/23: 6/10 11/19: 5/10 12/12 9/10 1/10: 7/10 2/11: 3/10 3/9: 6/10     Time  4    Period  Weeks    Status  Partially Met    Target Date  12/16/18      PT LONG TERM GOAL #2   Title  Patient will be able to ambulate on treadmill for 169mutes without increase in back/leg pain demonstrating improved community ambulation and treadmill walking at pulmonary rehab    Baseline  10/23: 20m67mtes increases  pain 11/19: 8mi64mes 12/12: has not had the chance to perform 1/10: was able to do 5 minutes without pain 2/11: able to do 5 minutes then had to bend over 3/9: 5 minutes 30 seconds at 2.0 mphs    Time  4    Period  Weeks  Status  Partially Met    Target Date  12/16/18      PT LONG TERM GOAL #3   Title  Patient will have decreased modified oswestry score <50% for improved ability to complete household tasks with less pain levels.     Baseline  10/23: 62% 11/19: 42%     Time  4    Period  Weeks    Status  Achieved      PT LONG TERM GOAL #4   Title  Patient will score 4+/5 on BLE MMT to demonstrate improved glute strength and ability to carry groceries with increased ease up/down stairs at home.    Baseline  10/23: 4-/5 grossly 11/19: 4/5 grossly; hamstrings 4-/5 12/12: 4/5 gross 1/10: 4/5 gross 2/11: gross 4+/5 with hamstrings and abuductors 4-/5 3/9: gross 4+/5 hamstrings and abductors 4-/5    Time  4    Period  Weeks    Status  Partially Met    Target Date  12/16/18      PT LONG TERM GOAL #5   Title  Patient will have decreased modified oswestry score <35% for improved ability to complete household tasks with less pain levels.     Baseline  11/19: 42% 12/12: 52% 1/10: 48%  2/11: 30%     Time  4    Period  Weeks    Status  Achieved      PT LONG TERM GOAL #6   Title  Patient will be able to make and/or strip the bed without pain and without needing rest breaks to perform iADLs independently.     Baseline  2/11: pain limits ability to perform, ~4 breaks required 3/9: takes her time, 3 breaks    Time  4    Period  Weeks    Status  Partially Met    Target Date  12/16/18      PT LONG TERM GOAL #7   Title  Patient will have decreased modified oswestry score <20% for improved ability to complete household tasks with less pain levels.     Baseline  2/11: 30%  3/9: 30%    Time  4    Period  Weeks    Status  On-going    Target Date  12/16/18            Plan - 11/18/18 1232     Clinical Impression Statement  Patent presents with progress towards goals at this time. Is able to ambulate for longer duration on treadmill at 2.57mh prior to symptom onset (5 minutes and 30 seconds). Patient demonstrates deficits in pain, strength, posture, ROM, and mobility as evidenced by slumped sitting posture, decreased endurance in postural muscles (TrA specifically), and presence of compensatory patterns during transitional postures and tasks. Patient's condition has the potential to improve in response to therapy. Maximum improvement is yet to be obtained. The anticipated improvement is attainable and reasonable in a generally predictable time. Patient will benefit from continued skilled therapeutic intervention to address remaining deficits in pain, posture, strength, and mobility in order to participate fully in ADLs with decreased pain, increase function, and improve overall QOL.    Rehab Potential  Fair    Clinical Impairments Affecting Rehab Potential  (+) motivation (-) chronicity of symptoms    PT Frequency  2x / week    PT Duration  4 weeks    PT Treatment/Interventions  ADLs/Self Care Home Management;Cryotherapy;Moist Heat;Traction;Iontophoresis 474mml Dexamethasone;Ultrasound;Gait training;Stair training;Functional mobility training;Neuromuscular re-education;Balance training;Therapeutic exercise;Therapeutic activities;Patient/family education;Manual techniques;Energy  conservation;Aquatic Therapy;Electrical Stimulation;Passive range of motion;Dry needling;Taping    PT Next Visit Plan  cross body coordination, progress core/postural stability, progress BLE strengthening    PT Home Exercise Plan  see instructions section from previous notes    Consulted and Agree with Plan of Care  Patient       Patient will benefit from skilled therapeutic intervention in order to improve the following deficits and impairments:  Abnormal gait, Cardiopulmonary status limiting activity, Decreased  activity tolerance, Decreased balance, Decreased endurance, Decreased range of motion, Difficulty walking, Decreased strength, Hypomobility, Decreased mobility, Impaired perceived functional ability, Impaired flexibility, Postural dysfunction, Obesity, Improper body mechanics, Pain  Visit Diagnosis: Chronic low back pain, unspecified back pain laterality, unspecified whether sciatica present  Abnormal posture  Muscle weakness (generalized)  Other abnormalities of gait and mobility     Problem List Patient Active Problem List   Diagnosis Date Noted  . DDD (degenerative disc disease), lumbosacral 10/10/2018  . Abnormal MRI, lumbar spine (08/29/2018) 10/10/2018  . 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 Lumbar nerve root impingement (Bilateral: L4 & L5) (L>R: L5) 10/10/2018  . Lumbar Spinal instability of L4-5 10/10/2018  . Lumbar facet hypertrophy 10/10/2018  . COPD (chronic obstructive pulmonary disease) with emphysema ([place]) 08/28/2018  . Elevated rheumatoid factor 08/13/2018  . Elevated C-reactive protein (CRP) 08/12/2018  . Elevated sed rate 08/12/2018  . Lumbar facet syndrome (Bilateral) 08/12/2018  . Back pain with left-sided radiculopathy 08/12/2018  . DDD (degenerative disc disease), lumbar 08/12/2018  . Chronic lower extremity pain (Primary Area of Pain) (Bilateral) (L>R) 07/23/2018  . Chronic low back pain (Secondary Area of Pain) (Bilateral) (L>R) w/ sciatica (Bilateral) 07/23/2018  . Chronic pain syndrome 07/23/2018  . Pharmacologic therapy 07/23/2018  . Disorder of skeletal system 07/23/2018  . Problems influencing health status 07/23/2018  . Chronic sacroiliac joint pain [facility] Area of Pain) (Bilateral) (L>R) 07/23/2018  . Status post lumboperitoneal shunt placement 02/26/2018  . 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 Tympanic membrane perforation 05/30/2016  . PMB (postmenopausal  bleeding) 04/26/2016  . Rash 12/27/2015  . 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 Plantar fasciitis of foot (Left) 05/26/2015  . Spondylosis of lumbar region without myelopathy or radiculopathy 05/26/2015  . Lumbar foraminal stenosis (L3-4, L4-5, L5-S1) (L>R) 05/26/2015  . Spinal stenosis of lumbar region with neurogenic claudication 05/26/2015  . Encounter for screening mammogram for malignant neoplasm of breast 12/02/2014  . Goiter 12/02/2014  . Chronic idiopathic gout of foot (Right) 07/06/2014  . Biceps tendonitis (Right) 05/19/2014  . Shoulder pain (Right) 05/19/2014  . Facial weakness 05/05/2014  . Post herpetic neuralgia 05/05/2014  . Chronic dysfunction of left eustachian tube 03/24/2014  . Osteopenia 03/13/2014  . Lumbar Grade 1 Anterolisthesis of L3/4 (14m) & L4/5 (4-140m(w/ Dynamic instability) 03/13/2014  . Family history of colon cancer 12/02/2013  . Dyshidrotic eczema 11/17/2013  . Diastasis recti 04/08/2013  . Facial nerve paresis 07/31/2012  . Blepharospasm 05/08/2012  . 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 Obesity 03/28/2012  . Hemoptysis 12/22/2011  . Neuropathic postherpetic trigeminal neuralgia 12/12/2011  . Allergic rhinitis, mild 10/12/2011  . Disequilibrium syndrome 09/27/2011  . Chronic bronchitis ([facility] [place]09/24/2012  . Essential hypertension 05/24/2011  . Type 2 diabetes mellitus without complications ([id number]. Pulmonary nodules 05/05/2011   MaJanna ArchPT, DPT   11/18/2018, 12:33 PM  [place] MAIN [facility] SERVICES [address] [place], [place], [address] Phone: [phone]   Fax:  [phone]  Name: Susan Callahan MRN: [id number] Date of Birth: [birth year]

## 2018-11-21 ENCOUNTER — Other Ambulatory Visit: Payer: Self-pay

## 2018-11-21 ENCOUNTER — Ambulatory Visit: Payer: Medicare Other

## 2018-11-21 DIAGNOSIS — R2689 Other abnormalities of gait and mobility: Secondary | ICD-10-CM

## 2018-11-21 DIAGNOSIS — R293 Abnormal posture: Secondary | ICD-10-CM

## 2018-11-21 DIAGNOSIS — M545 Low back pain: Principal | ICD-10-CM

## 2018-11-21 DIAGNOSIS — G8929 Other chronic pain: Secondary | ICD-10-CM

## 2018-11-21 DIAGNOSIS — M6281 Muscle weakness (generalized): Secondary | ICD-10-CM

## 2018-11-21 NOTE — Therapy (Addendum)
[place] [facility] SERVICES [address] [place], [place], [address] Phone: [phone]   Fax:  [phone]  Physical Therapy Treatment/Progress Note   Dates of reporting period  10/09/2018   to   11/21/2018   Patient Details  Name: Susan Callahan MRN: [id number] Date of Birth: 12-Sep-1950 Referring Provider (PT): Francesco Sor   Encounter Date: 11/21/2018  PT End of Session - 11/21/18 1201    Visit Number  29    Number of Visits  36    Date for PT Re-Evaluation  12/16/18    Authorization Type  10/10 starting 1/29 (new 1/10 starting 11/21/2018)    PT Start Time  1019    PT Stop Time  1059    PT Time Calculation (min)  40 min    Equipment Utilized During Treatment  Gait belt    Activity Tolerance  Patient tolerated treatment well    Behavior During Therapy  WFL for tasks assessed/performed       Past Medical History:  Diagnosis Date  . COPD (chronic obstructive pulmonary disease) ([place])   . Depression 1974  . Diabetes mellitus without complication ([place])   . Hypertension   . Migraine   . 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 LUMBAR PERITONEAL SHUNT    . TYMPANOSTOMY TUBE PLACEMENT      There were no vitals filed for this visit.  Subjective Assessment - 11/21/18 1036    Subjective  Patient reports that she is doing well today. She went to the gym on Monday and was able to use the treadmill for a total of about 15 minutes by doing 3 cycles of 4 minutes with no to minimal pain, then 1 minute with leaning forward to help decrease pain back. She plans on working in the garden today. Reports that she has been performing HEP consistently and that it has been going well. Her appointment with the dermatologist is in a little less than 2 weeks.    Pertinent History  Pain began in 2007 and has gradually got worse and has progressed to nonstop. Patient states pain last all day long,  everyday with no change in symptoms. Medication helps briefly. States she has difficulty with showering and standing. States she has to stand with her knee bent due to pulling sensation in legs. Pain begins immediately when stands and begins to walk. Pain feels better when she bends her knees when standing. Pain became constant and non stop around Dec 2018. Denies bowel or bladder changes, unexplained weakness, or unexpected weight loss/gain. She currently goes to pulmonary rehab 3x a week, but has increased pain with treadmill walking. Will walk with cane on occasion around house especially at night due to occasional dizziness and vision impairments in medical history. States she also has difficulty with carrying groceries up steps due to low back pain. She would like to be able to walk better and have less pain to be less reliant on medication.     Limitations  Lifting;Standing;Walking;House hold activities    How long can you stand comfortably?  pain begins immediately     How long can you walk comfortably?  pain begins immediately     Patient Stated Goals  walk better, have less pain    Currently in Pain?  No/denies    Pain Onset  More than a month ago        **Patient's goals performed  and re-assessed during previous session; please see note from 11/18/2018 for patient goal assessment**   Manual Therapy: -Supine hamstring stretch 1x60 seconds each leg -Supine hamstring stretch with dorsiflexion/plantarflexion for nerve glide 1x60 seconds each leg -Lumbar rotations x10 each side with 5 second holds for low back stretching     TherEx: -Supine SLR with TrA activation x15 each LE  -Supine alternating marches with TrA activation x15 each LE    -Paloff press GTB 15x each LE in standing; patient reports very minimal increase in LLE pain during performance on R side which subsides with less tension on band   -Forward lunges using bosu ball as target for back LE x10 each side; patient requires  intermittent verbal cues to keep knee of forward leg behind toes; performance in parallel bars with CGA and use of BUE support for balance  -Standing hip abduction 1x12 each side in parallel bars with BUE support for balance; patient benefits from verbal and tactile cues for upright posture  -Standing hip extension 1x10 each side in parallel bars with BUE support for balance; patient benefits from verbal and tactile cues for upright posture; patient indicates minor increase in low back pain discomfort upon completion, but states that it is a good "workout" pain  -Standing forward shoulder press with weighted bar 2# 1x10; patient benefits from rare verbal cues to breathe, tighten up core, and to maintain upright posture  -Standing shoulder flexion with weighted bar 2# 1x10; patient benefits from rare verbal cues to breathe, tighten up core, and to maintain upright posture  -Standing alternating weighted side bend with 5# dumbbells x10 each side; patient demonstrates proper form with exercise    Patient's condition has the potential to improve in response to therapy. Maximum improvement is yet to be obtained. The anticipated improvement is attainable and reasonable in a generally predictable time.  Patient reports she feels that she is starting to get much stronger and that she is having less severe back/leg pain less often. She can tell that it is making a huge difference in her life and that she is able to do more things that she enjoys because of her progress                       PT Education - 11/21/18 1036    Education Details  exercise technique, posture, core and BLE strength    Person(s) Educated  Patient    Methods  Explanation;Demonstration;Tactile cues;Verbal cues    Comprehension  Verbalized understanding;Returned demonstration;Verbal cues required;Tactile cues required;Need further instruction       PT Short Term Goals - 11/18/18 1226      PT SHORT TERM GOAL  #1   Title  Patient will be independent with completion of HEP to improve ability to complete functional tasks and functional ADLs.    Baseline  10/23: will give HEP next session 11/19: HEP compliance    Time  2    Period  Weeks    Status  Achieved      PT SHORT TERM GOAL #2   Title  Patient will report pain score less than 4/10 on VAS to demonstrate reduction of pain levels with functional tasks.     Baseline  10/23: 6/10 11/19: 5/10 12/12: 9/10 1/10: 7/10  2/11: 3/10     Time  2    Period  Weeks    Status  Achieved        PT Long Term Goals - 11/18/18  [place] #1   Title  Patient will report pain score of <3/10 on VAS for decreased pain levels and ease with functional activities.    Baseline  10/23: 6/10 11/19: 5/10 12/12 9/10 1/10: 7/10 2/11: 3/10 3/9: 6/10     Time  4    Period  Weeks    Status  Partially Met    Target Date  12/16/18      PT LONG TERM GOAL #2   Title  Patient will be able to ambulate on treadmill for 1mnutes without increase in back/leg pain demonstrating improved community ambulation and treadmill walking at pulmonary rehab    Baseline  10/23: 066mutes increases pain 11/19: 20m29mtes 12/12: has not had the chance to perform 1/10: was able to do 5 minutes without pain 2/11: able to do 5 minutes then had to bend over 3/9: 5 minutes 30 seconds at 2.0 mphs    Time  4    Period  Weeks    Status  Partially Met    Target Date  12/16/18      PT LONG TERM GOAL #3   Title  Patient will have decreased modified oswestry score <50% for improved ability to complete household tasks with less pain levels.     Baseline  10/23: 62% 11/19: 42%     Time  4    Period  Weeks    Status  Achieved      PT LONG TERM GOAL #4   Title  Patient will score 4+/5 on BLE MMT to demonstrate improved glute strength and ability to carry groceries with increased ease up/down stairs at home.    Baseline  10/23: 4-/5 grossly 11/19: 4/5 grossly; hamstrings 4-/5 12/12: 4/5  gross 1/10: 4/5 gross 2/11: gross 4+/5 with hamstrings and abuductors 4-/5 3/9: gross 4+/5 hamstrings and abductors 4-/5    Time  4    Period  Weeks    Status  Partially Met    Target Date  12/16/18      PT LONG TERM GOAL #5   Title  Patient will have decreased modified oswestry score <35% for improved ability to complete household tasks with less pain levels.     Baseline  11/19: 42% 12/12: 52% 1/10: 48%  2/11: 30%     Time  4    Period  Weeks    Status  Achieved      PT LONG TERM GOAL #6   Title  Patient will be able to make and/or strip the bed without pain and without needing rest breaks to perform iADLs independently.     Baseline  2/11: pain limits ability to perform, ~4 breaks required 3/9: takes her time, 3 breaks    Time  4    Period  Weeks    Status  Partially Met    Target Date  12/16/18      PT LONG TERM GOAL #7   Title  Patient will have decreased modified oswestry score <20% for improved ability to complete household tasks with less pain levels.     Baseline  2/11: 30%  3/9: 30%    Time  4    Period  Weeks    Status  On-going    Target Date  12/16/18            Plan - 11/21/18 1213    Clinical Impression Statement  The patient continues to progress with core and BLE strengthening  exercises. Patient was able to perform standing paloff presses today with proper form, in addition to a number of different exercises with weights (both weighted bar and dumbbells) without increased pain/discomfort. Patient also required fewer verbal cues and demonstrated improved form with forward lunges today. Patient reports that she feels that she is starting to get much stronger and that she is having less severe back/leg pain less often. She can tell that it is making a huge difference in her life and that she is able to do more things that she enjoys because of her progress. Patient's goals were performed and re-assessed last session (11/18/2018), please see that note for further  evaluation and assessment of her progress. Patient's condition has the potential to continue to improve in response to therapy. Maximum improvement is yet to be obtained. The anticipated improvement is attainable and reasonable in a generally predictable time. The patient will continue to benefit from skilled physical therapy to improve strength, to reduce pain levels with ADLs, and to improve quality of life.    Rehab Potential  Fair    Clinical Impairments Affecting Rehab Potential  (+) motivation (-) chronicity of symptoms    PT Frequency  2x / week    PT Duration  4 weeks    PT Treatment/Interventions  ADLs/Self Care Home Management;Cryotherapy;Moist Heat;Traction;Iontophoresis 47m/ml Dexamethasone;Ultrasound;Gait training;Stair training;Functional mobility training;Neuromuscular re-education;Balance training;Therapeutic exercise;Therapeutic activities;Patient/family education;Manual techniques;Energy conservation;Aquatic Therapy;Electrical Stimulation;Passive range of motion;Dry needling;Taping    PT Next Visit Plan  cross body coordination, progress core/postural stability, progress BLE strengthening    PT Home Exercise Plan  see instructions section from previous notes    Consulted and Agree with Plan of Care  Patient       Patient will benefit from skilled therapeutic intervention in order to improve the following deficits and impairments:  Abnormal gait, Cardiopulmonary status limiting activity, Decreased activity tolerance, Decreased balance, Decreased endurance, Decreased range of motion, Difficulty walking, Decreased strength, Hypomobility, Decreased mobility, Impaired perceived functional ability, Impaired flexibility, Postural dysfunction, Obesity, Improper body mechanics, Pain  Visit Diagnosis: Chronic low back pain, unspecified back pain laterality, unspecified whether sciatica present  Abnormal posture  Muscle weakness (generalized)  Other abnormalities of gait and  mobility     Problem List Patient Active Problem List   Diagnosis Date Noted  . DDD (degenerative disc disease), lumbosacral 10/10/2018  . Abnormal MRI, lumbar spine (08/29/2018) 10/10/2018  . 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 Lumbar nerve root impingement (Bilateral: L4 & L5) (L>R: L5) 10/10/2018  . Lumbar Spinal instability of L4-5 10/10/2018  . Lumbar facet hypertrophy 10/10/2018  . COPD (chronic obstructive pulmonary disease) with emphysema ([place] 08/28/2018  . Elevated rheumatoid factor 08/13/2018  . Elevated C-reactive protein (CRP) 08/12/2018  . Elevated sed rate 08/12/2018  . Lumbar facet syndrome (Bilateral) 08/12/2018  . Back pain with left-sided radiculopathy 08/12/2018  . DDD (degenerative disc disease), lumbar 08/12/2018  . Chronic lower extremity pain (Primary Area of Pain) (Bilateral) (L>R) 07/23/2018  . Chronic low back pain (Secondary Area of Pain) (Bilateral) (L>R) w/ sciatica (Bilateral) 07/23/2018  . Chronic pain syndrome 07/23/2018  . Pharmacologic therapy 07/23/2018  . Disorder of skeletal system 07/23/2018  . Problems influencing health status 07/23/2018  . Chronic sacroiliac joint pain ([facility]) (Bilateral) (L>R) 07/23/2018  . Status post lumboperitoneal shunt placement 02/26/2018  . 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 Tympanic membrane perforation 05/30/2016  . PMB (postmenopausal bleeding) 04/26/2016  .  Rash 12/27/2015  . 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 Plantar fasciitis of foot (Left) 05/26/2015  . Spondylosis of lumbar region without myelopathy or radiculopathy 05/26/2015  . Lumbar foraminal stenosis (L3-4, L4-5, L5-S1) (L>R) 05/26/2015  . Spinal stenosis of lumbar region with neurogenic claudication 05/26/2015  . Encounter for screening mammogram for malignant neoplasm of breast 12/02/2014  . Goiter  12/02/2014  . Chronic idiopathic gout of foot (Right) 07/06/2014  . Biceps tendonitis (Right) 05/19/2014  . Shoulder pain (Right) 05/19/2014  . Facial weakness 05/05/2014  . Post herpetic neuralgia 05/05/2014  . Chronic dysfunction of left eustachian tube 03/24/2014  . Osteopenia 03/13/2014  . Lumbar Grade 1 Anterolisthesis of L3/4 (26m) & L4/5 (4-18m(w/ Dynamic instability) 03/13/2014  . Family history of colon cancer 12/02/2013  . Dyshidrotic eczema 11/17/2013  . Diastasis recti 04/08/2013  . Facial nerve paresis 07/31/2012  . Blepharospasm 05/08/2012  . 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 Obesity 03/28/2012  . Hemoptysis 12/22/2011  . Neuropathic postherpetic trigeminal neuralgia 12/12/2011  . Allergic rhinitis, mild 10/12/2011  . Disequilibrium syndrome 09/27/2011  . Chronic bronchitis ([facility]09/24/2012  . Essential hypertension 05/24/2011  . Type 2 diabetes mellitus without complications ([id number]/[id number]. Pulmonary nodules 05/05/2011   ZaOrlean PattenSPT  This entire session was performed under direct supervision and direction of a licensed therapist/therapist assistant . I have personally read, edited and approve of the note as written.  MaJanna ArchPT, DPT   11/21/2018, 12:55 PM  [place] REMid [facility] [address]d[place]aulieu[place]hone: [phone] Fax:  [phone]: LiKiaira PointerRN: [id number] of Birth: 02/1951/05/22

## 2018-11-26 ENCOUNTER — Ambulatory Visit: Payer: Medicare Other

## 2018-11-28 ENCOUNTER — Ambulatory Visit: Payer: Medicare Other

## 2018-12-02 ENCOUNTER — Ambulatory Visit: Payer: Medicare Other | Attending: Nurse Practitioner | Admitting: Nurse Practitioner

## 2018-12-02 ENCOUNTER — Encounter: Payer: Self-pay | Admitting: Nurse Practitioner

## 2018-12-02 ENCOUNTER — Other Ambulatory Visit: Payer: Self-pay | Admitting: Nurse Practitioner

## 2018-12-02 ENCOUNTER — Other Ambulatory Visit: Payer: Self-pay

## 2018-12-02 VITALS — BP 134/73 | HR 64 | Temp 97.7°F | Resp 18 | Ht 65.0 in | Wt 205.0 lb

## 2018-12-02 DIAGNOSIS — G894 Chronic pain syndrome: Secondary | ICD-10-CM

## 2018-12-02 DIAGNOSIS — M47816 Spondylosis without myelopathy or radiculopathy, lumbar region: Secondary | ICD-10-CM | POA: Insufficient documentation

## 2018-12-02 DIAGNOSIS — M533 Sacrococcygeal disorders, not elsewhere classified: Secondary | ICD-10-CM | POA: Diagnosis not present

## 2018-12-02 DIAGNOSIS — G8929 Other chronic pain: Secondary | ICD-10-CM | POA: Insufficient documentation

## 2018-12-02 DIAGNOSIS — Z79891 Long term (current) use of opiate analgesic: Secondary | ICD-10-CM | POA: Insufficient documentation

## 2018-12-02 DIAGNOSIS — M5416 Radiculopathy, lumbar region: Secondary | ICD-10-CM | POA: Insufficient documentation

## 2018-12-02 MED ORDER — TRAMADOL HCL 50 MG PO TABS: 50.0000 mg | ORAL_TABLET | Freq: Four times a day (QID) | ORAL | 2 refills | Status: DC | PRN

## 2018-12-02 MED ORDER — TRAMADOL HCL 50 MG PO TABS: 50.0000 mg | ORAL_TABLET | Freq: Four times a day (QID) | ORAL | 0 refills | Status: DC | PRN

## 2018-12-02 NOTE — Progress Notes (Signed)
Patient's Name: Maja Mccaffery  MRN: [id number]  Referring Provider: Konrad Saha, MD  DOB: 04-22-1951  PCP: Konrad Saha, MD  DOS: 12/02/2018  Note by: Dionisio David, NP  Service setting: Ambulatory outpatient  Specialty: Interventional Pain Management  Location: [facility] (AMB) Pain Management Facility    Patient type: Established   HPI  Reason for Visit: Ms. Kura Bethards is a 68 y.o. year old, female patient, who comes today with a chief complaint of Leg Pain (bilateral) and Back Pain (low) Last Appointment: Her last appointment at our practice was on 11/04/2018. I last saw her on 11/04/2018.  Pain Assessment: Today, Ms. Forrester describes the severity of the Chronic pain as a 3 /10. She indicates the location/referral of the pain to be Leg Right, Left/denies. Onset was: More than a month ago. The quality of pain is described as Sore, Aching. Temporal description, or timing of pain is: Intermittent. Possible modifying factors: procedure. Ms. Cove  height is 5' 5"  (1.651 m) and weight is 205 lb (93 kg). Her temperature is 97.7 F (36.5 C). Her blood pressure is 134/73 and her pulse is 64. Her respiration is 18 and oxygen saturation is 93%.  She continues to get good relief from her lumbar epidural steroid injection.  Controlled Substance Pharmacotherapy Assessment REMS (Risk Evaluation and Mitigation Strategy)  Analgesic:Tramadol 50 mg 1 tablet 4 times daily  MME/day:51m/day  TDewayne Shorter RN  12/02/2018  9:34 AM  Signed Nursing Pain Medication Assessment:  Safety precautions to be maintained throughout the outpatient stay will include: orient to surroundings, keep bed in low position, maintain call bell within reach at all times, provide assistance with transfer out of bed and ambulation.  Medication Inspection Compliance: Pill count conducted under aseptic conditions, in front of the patient. Neither the pills nor the bottle was removed from the patient's sight at any time. Once  count was completed pills were immediately returned to the patient in their original bottle.  Medication: Tramadol (Ultram) Pill/Patch Count: 77 of 120 pills remain Pill/Patch Appearance: Markings consistent with prescribed medication Bottle Appearance: Standard pharmacy container. Clearly labeled. Filled Date: 03 / 09/ 2020 Last Medication intake:  Today   Pharmacokinetics: Liberation and absorption (onset of action): WNL Distribution (time to peak effect): WNL Metabolism and excretion (duration of action): WNL         Pharmacodynamics: Desired effects: Analgesia: Ms. CDevivoreports >50% benefit. Functional ability: Patient reports that medication allows her to accomplish basic ADLs Clinically meaningful improvement in function (CMIF): Sustained CMIF goals met Perceived effectiveness: Described as relatively effective, allowing for increase in activities of daily living (ADL) Undesirable effects: Side-effects or Adverse reactions: None reported Monitoring: [place] PMP: Online review of the past 179-montheriod conducted. Compliant with practice rules and regulations Last UDS on record: Summary  Date Value Ref Range Status  07/23/2018 FINAL  Final    Comment:    ==================================================================== TOX[facility] COMP DRUG ANALYSIS,UR ==================================================================== Test                             Result       Flag       Units Drug Present and Declared for Prescription Verification   Tramadol                       >2463        EXPECTED   ng/mg creat   O-Desmethyltramadol            >  2463        EXPECTED   ng/mg creat   N-Desmethyltramadol            >2463        EXPECTED   ng/mg creat    Source of tramadol is a prescription medication.    O-desmethyltramadol and N-desmethyltramadol are expected    metabolites of tramadol.   Gabapentin                     PRESENT      EXPECTED   Amitriptyline                  PRESENT       EXPECTED   Nortriptyline                  PRESENT      EXPECTED    Nortriptyline is an expected metabolite of amitriptyline.   Verapamil                      PRESENT      EXPECTED Drug Present not Declared for Prescription Verification   Acetaminophen                  PRESENT      UNEXPECTED   Ibuprofen                      PRESENT      UNEXPECTED   Lidocaine                      PRESENT      UNEXPECTED   Dextrorphan/Levorphanol        PRESENT      UNEXPECTED    Dextrorphan is an expected metabolite of dextromethorphan, an    over-the-counter or prescription cough suppressant. Levorphanol    is a scheduled prescription medication. Dextrorphan cannot be    distinguished from levorphanol by the method used for analysis.   Guaifenesin                    PRESENT      UNEXPECTED    Guaifenesin may be administered as an over-the-counter or    prescription drug; it may also be present as a breakdown product    of methocarbamol. ==================================================================== Test                      Result    Flag   Units      Ref Range   Creatinine              203              mg/dL      >=20 ==================================================================== Declared Medications:  The flagging and interpretation on this report are based on the  following declared medications.  Unexpected results may arise from  inaccuracies in the declared medications.  **Note: The testing scope of this panel includes these medications:  Amitriptyline  Gabapentin  Tramadol  Verapamil  **Note: The testing scope of this panel does not include following  reported medications:  Allopurinol  Calcium carbonate (Calcarb with Vitamin D)  Furosemide  Magnesium  Metformin  Multivitamin  Mupirocin  Spironolactone  Umeclidinium (Incruse Ellipta)  Vitamin B (Super B Complex)  Vitamin D (Calcarb with Vitamin  D) ==================================================================== For clinical consultation, please call [phone]. ====================================================================    UDS interpretation: Unexpected findings:  Medication Assessment Form: Reviewed. Patient indicates being compliant with therapy Treatment compliance: Compliant Risk Assessment Profile: Aberrant behavior: See initial evaluations. None observed or detected today Comorbid factors increasing risk of overdose: See initial evaluation. No additional risks detected today Opioid risk tool (ORT):  Opioid Risk  12/02/2018  Alcohol 0  Illegal Drugs 0  Rx Drugs 0  Alcohol 0  Illegal Drugs 0  Rx Drugs 0  Age between 16-45 years  0  History of Preadolescent Sexual Abuse 0  Psychological Disease 0  Depression 0  Opioid Risk Tool Scoring 0  Opioid Risk Interpretation Low Risk    ORT Scoring interpretation table:  Score <3 = Low Risk for SUD  Score between 4-7 = Moderate Risk for SUD  Score >8 = High Risk for Opioid Abuse   Risk of substance use disorder (SUD): Low  Risk Mitigation Strategies:  Patient Counseling: Covered Patient-Prescriber Agreement (PPA): Present and active  Notification to other healthcare providers: Done  Pharmacologic Plan: No change in therapy, at this time.             ROS  Constitutional: Denies any fever or chills Gastrointestinal: No reported hemesis, hematochezia, vomiting, or acute GI distress Musculoskeletal: Denies any acute onset joint swelling, redness, loss of ROM, or weakness Neurological: No reported episodes of acute onset apraxia, aphasia, dysarthria, agnosia, amnesia, paralysis, loss of coordination, or loss of consciousness  Medication Review  Calcium Carbonate-Vitamin D, acetaZOLAMIDE, allopurinol, amitriptyline, b complex vitamins, furosemide, gabapentin, magnesium oxide, metFORMIN, multivitamin, mupirocin ointment, traMADol, umeclidinium  bromide, and verapamil  History Review  Allergy: Ms. Schuff is allergic to penicillins; sulfa antibiotics; and bee venom. Drug: Ms. Kugel  reports no history of drug use. Alcohol:  reports no history of alcohol use. Tobacco:  reports that she quit smoking about 27 years ago. Her smoking use included cigarettes. She has a 81.00 pack-year smoking history. She has never used smokeless tobacco. Social: Ms. Esqueda  reports that she quit smoking about 27 years ago. Her smoking use included cigarettes. She has a 81.00 pack-year smoking history. She has never used smokeless tobacco. She reports that she does not drink alcohol or use drugs. Medical:  has a past medical history of COPD (chronic obstructive pulmonary disease) ([place]), Depression (1974), Diabetes mellitus without complication ([place]), Hypertension, Migraine, and Ramsay Hunt auricular syndrome. Surgical: Ms. Cannell  has a past surgical history that includes Tympanostomy tube placement and Lumbar peritoneal shunt. Family: family history is not on file. Problem List: Ms. 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  Lab Review  Kidney Function Lab Results  Component Value Date   BUN 21 07/23/2018   CREATININE 1.05 (H) 07/23/2018   BCR 20 07/23/2018   GFRAA 64 07/23/2018   GFRNONAA 55 (L) 07/23/2018  Liver Function Lab Results  Component Value Date   AST 17 07/23/2018   ALBUMIN 4.7 07/23/2018  Note: Above Lab results reviewed.  Imaging Review  DG C-Arm 1-60 Min-No Report Fluoroscopy was utilized by the requesting physician.  No radiographic  interpretation.  Note: Reviewed        Physical Exam  General appearance: Well nourished, well developed, and well hydrated. In no apparent acute distress Mental status: Alert, oriented x 3 (person, place, &  time)       Respiratory: No evidence of acute respiratory distress Eyes: PERLA  Vitals: BP 134/73   Pulse 64   Temp 97.7 F (36.5 C)   Resp 18   Ht 5' 5"  (1.651 m)   Wt 205 lb (93 kg)   SpO2 93%   BMI 34.11 kg/m  BMI: Estimated body mass index is 34.11 kg/m as calculated from the following:   Height as of this encounter: 5' 5"  (1.651 m).   Weight as of this encounter: 205 lb (93 kg). Ideal: Ideal body weight: 57 kg (125 lb 10.6 oz) Adjusted ideal body weight: 71.4 kg (157 lb 6.4 oz) Lumbar Spine Area Exam  Skin & Axial Inspection: No masses, redness, or swelling Alignment: Symmetrical Functional ROM: Unrestricted ROM       Stability: No instability detected Muscle Tone/Strength: Functionally intact. No obvious neuro-muscular anomalies detected. Sensory (Neurological): Unimpaired Palpation: Tender       Provocative Tests: Hyperextension/rotation test: deferred today       Lumbar quadrant test (Kemp's test): deferred today       Lateral bending test: deferred today        Gait & Posture Assessment  Ambulation: Unassisted Gait: Relatively normal for age and body habitus Posture: WNL  Lower Extremity Exam    Side: Right lower extremity  Side: Left lower extremity  Stability: No instability observed          Stability: No instability observed          Skin & Extremity Inspection: Skin color, temperature, and hair growth are WNL. No peripheral edema or cyanosis. No masses, redness, swelling, asymmetry, or associated skin lesions. No contractures.  Skin & Extremity Inspection: Skin color, temperature, and hair growth are WNL. No peripheral edema or cyanosis. No masses, redness, swelling, asymmetry, or associated skin lesions. No contractures.  Functional ROM: Unrestricted ROM                  Functional ROM: Unrestricted ROM                  Muscle Tone/Strength: Functionally intact. No obvious neuro-muscular anomalies detected.  Muscle Tone/Strength: Functionally intact. No  obvious neuro-muscular anomalies detected.  Sensory (Neurological): Unimpaired        Sensory (Neurological): Unimpaired            Palpation: No palpable anomalies  Palpation: No palpable anomalies   Assessment   Status Diagnosis  Controlled Controlled Controlled 1. Spondylosis of lumbar region without myelopathy or radiculopathy   2. Chronic pain syndrome   3. 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 Long term current use of opiate analgesic      Updated Problems: Problem  Long Term Current Use of Opiate Analgesic  Tympanic Membrane Central Perforation, Left    Plan of Care  Pharmacotherapy (Medications Ordered): Meds ordered this encounter  Medications  . DISCONTD: traMADol (ULTRAM) 50 MG tablet    Sig: Take 1 tablet (50 mg total) by mouth every 6 (six) hours as needed for up to 30 days for severe pain. Month last 30 days.    Dispense:  120 tablet    Refill:  0    Do not place this medication, or any other prescription from our practice, on "Automatic Refill". Patient may have prescription filled one day early if pharmacy is closed on scheduled refill date.    Order Specific Question:   Supervising Provider    Answer:   Dossie Arbour,  FRANCISCO [[id number]]  . traMADol (ULTRAM) 50 MG tablet    Sig: Take 1 tablet (50 mg total) by mouth every 6 (six) hours as needed for severe pain.    Dispense:  120 tablet    Refill:  2    Please void previous 30 day prescription only.    Order Specific Question:   Supervising Provider    Answer:   Milinda Pointer [[id number]]   Administered today: Sonia Side. Couse had no medications administered during this visit.  Orders:  Orders Placed This Encounter  Procedures  . ToxASSURE Select 13 (MW), Urine    Volume: 30 ml(s). Minimum 3 ml of urine is needed. Document temperature of fresh sample. Indications: Long term (current) use of opiate  analgesic (Q11.941)   Follow-up plan:   Return in about 3 months (around 03/04/2019) for MedMgmt.  Not at this time.    Interventional options: Interventional options:  Considering: Diagnostic midline lumbar epidural steroid injection Diagnostic bilateral lumbar facet nerve block Possible bilateral lumbar facet radiofrequency ablation   PRN Procedures: None at this time    Note by: Dionisio David, NP Date: 12/02/2018; Time: 10:02 AM

## 2018-12-02 NOTE — Progress Notes (Signed)
Nursing Pain Medication Assessment:  Safety precautions to be maintained throughout the outpatient stay will include: orient to surroundings, keep bed in low position, maintain call bell within reach at all times, provide assistance with transfer out of bed and ambulation.  Medication Inspection Compliance: Pill count conducted under aseptic conditions, in front of the patient. Neither the pills nor the bottle was removed from the patient's sight at any time. Once count was completed pills were immediately returned to the patient in their original bottle.  Medication: Tramadol (Ultram) Pill/Patch Count: 77 of 120 pills remain Pill/Patch Appearance: Markings consistent with prescribed medication Bottle Appearance: Standard pharmacy container. Clearly labeled. Filled Date: 03 / 09/ 2020 Last Medication intake:  Today

## 2018-12-02 NOTE — Patient Instructions (Signed)
____________________________________________________________________________________________  Medication Rules  Purpose: To inform patients, and their family members, of our rules and regulations.  Applies to: All patients receiving prescriptions (written or electronic).  Pharmacy of record: Pharmacy where electronic prescriptions will be sent. If written prescriptions are taken to a different pharmacy, please inform the nursing staff. The pharmacy listed in the electronic medical record should be the one where you would like electronic prescriptions to be sent.  Electronic prescriptions: In compliance with the [place] Strengthen Opioid Misuse Prevention (STOP) Act of 2017 (Session Law 2017-74/H243), effective September 11, 2018, all controlled substances must be electronically prescribed. Calling prescriptions to the pharmacy will cease to exist.  Prescription refills: Only during scheduled appointments. Applies to all prescriptions.  NOTE: The following applies primarily to controlled substances (Opioid* Pain Medications).   Patient's responsibilities: 1. Pain Pills: Bring all pain pills to every appointment (except for procedure appointments). 2. Pill Bottles: Bring pills in original pharmacy bottle. Always bring the newest bottle. Bring bottle, even if empty. 3. Medication refills: You are responsible for knowing and keeping track of what medications you take and those you need refilled. The day before your appointment: write a list of all prescriptions that need to be refilled. The day of the appointment: give the list to the admitting nurse. Prescriptions will be written only during appointments. No prescriptions will be written on procedure days. If you forget a medication: it will not be "Called in", "Faxed", or "electronically sent". You will need to get another appointment to get these prescribed. No early refills. Do not call asking to have your prescription filled  early. 4. Prescription Accuracy: You are responsible for carefully inspecting your prescriptions before leaving our office. Have the discharge nurse carefully go over each prescription with you, before taking them home. Make sure that your name is accurately spelled, that your address is correct. Check the name and dose of your medication to make sure it is accurate. Check the number of pills, and the written instructions to make sure they are clear and accurate. Make sure that you are given enough medication to last until your next medication refill appointment. 5. Taking Medication: Take medication as prescribed. When it comes to controlled substances, taking less pills or less frequently than prescribed is permitted and encouraged. Never take more pills than instructed. Never take medication more frequently than prescribed.  6. Inform other Doctors: Always inform, all of your healthcare providers, of all the medications you take. 7. Pain Medication from other Providers: You are not allowed to accept any additional pain medication from any other Doctor or Healthcare provider. There are two exceptions to this rule. (see below) In the event that you require additional pain medication, you are responsible for notifying us, as stated below. 8. Medication Agreement: You are responsible for carefully reading and following our Medication Agreement. This must be signed before receiving any prescriptions from our practice. Safely store a copy of your signed Agreement. Violations to the Agreement will result in no further prescriptions. (Additional copies of our Medication Agreement are available upon request.) 9. Laws, Rules, & Regulations: All patients are expected to follow all Federal and State Laws, Statutes, Rules, & Regulations. Ignorance of the Laws does not constitute a valid excuse. The use of any illegal substances is prohibited. 10. Adopted CDC guidelines & recommendations: Target dosing levels will be  at or below 60 MME/day. Use of benzodiazepines** is not recommended.  Exceptions: There are only two exceptions to the rule of not   receiving pain medications from other Healthcare Providers. 1. Exception #1 (Emergencies): In the event of an emergency (i.e.: accident requiring emergency care), you are allowed to receive additional pain medication. However, you are responsible for: As soon as you are able, call our office [phone], at any time of the day or night, and leave a message stating your name, the date and nature of the emergency, and the name and dose of the medication prescribed. In the event that your call is answered by a member of our staff, make sure to document and save the date, time, and the name of the person that took your information.  2. Exception #2 (Planned Surgery): In the event that you are scheduled by another doctor or dentist to have any type of surgery or procedure, you are allowed (for a period no longer than 30 days), to receive additional pain medication, for the acute post-op pain. However, in this case, you are responsible for picking up a copy of our "Post-op Pain Management for Surgeons" handout, and giving it to your surgeon or dentist. This document is available at our office, and does not require an appointment to obtain it. Simply go to our office during business hours (Monday-Thursday from 8:00 AM to 4:00 PM) (Friday 8:00 AM to 12:00 Noon) or if you have a scheduled appointment with us, prior to your surgery, and ask for it by name. In addition, you will need to provide us with your name, name of your surgeon, type of surgery, and date of procedure or surgery.  *Opioid medications include: morphine, codeine, oxycodone, oxymorphone, hydrocodone, hydromorphone, meperidine, tramadol, tapentadol, buprenorphine, fentanyl, methadone. **Benzodiazepine medications include: diazepam (Valium), alprazolam (Xanax), clonazepam (Klonopine), lorazepam (Ativan), clorazepate  (Tranxene), chlordiazepoxide (Librium), estazolam (Prosom), oxazepam (Serax), temazepam (Restoril), triazolam (Halcion) (Last updated: 11/08/2017) ____________________________________________________________________________________________    

## 2018-12-03 NOTE — Therapy (Signed)
[place] MAIN [facility] SERVICES [address] [place], [place], [address] Phone: [phone]   Fax:  [phone]  Patient Details  Name: Susan Callahan MRN: [id number] Date of Birth: Dec 30, 1950 Referring Provider:  No ref. provider found  Encounter Date: 12/03/2018  Patient called by PT to ensure patient is doing well, does not have questions, and review HEP. Called due to current outpatient closure for COVID- 19. Patient did not pick up PT call. Voicemail was full, unable to leave voicemail. Will attempt again.  Janna Arch, PT, DPT   12/03/2018, 8:57 AM  [place] MAIN [facility] SERVICES [address] [place], [place], [address] Phone: [phone]   Fax:  [phone]

## 2018-12-05 LAB — TOXASSURE SELECT 13 (MW), URINE

## 2019-01-15 DIAGNOSIS — C50211 Malignant neoplasm of upper-inner quadrant of right female breast: Secondary | ICD-10-CM | POA: Insufficient documentation

## 2019-01-15 DIAGNOSIS — Z170 Estrogen receptor positive status [ER+]: Secondary | ICD-10-CM | POA: Insufficient documentation

## 2019-01-31 DIAGNOSIS — Z1379 Encounter for other screening for genetic and chromosomal anomalies: Secondary | ICD-10-CM | POA: Insufficient documentation

## 2019-02-04 ENCOUNTER — Other Ambulatory Visit: Payer: Self-pay

## 2019-02-04 ENCOUNTER — Ambulatory Visit: Payer: Medicare Other | Attending: Rehabilitation

## 2019-02-04 DIAGNOSIS — M6281 Muscle weakness (generalized): Secondary | ICD-10-CM | POA: Insufficient documentation

## 2019-02-04 DIAGNOSIS — G8929 Other chronic pain: Secondary | ICD-10-CM | POA: Insufficient documentation

## 2019-02-04 DIAGNOSIS — M545 Low back pain, unspecified: Secondary | ICD-10-CM

## 2019-02-04 DIAGNOSIS — R2689 Other abnormalities of gait and mobility: Secondary | ICD-10-CM

## 2019-02-04 DIAGNOSIS — R293 Abnormal posture: Secondary | ICD-10-CM | POA: Diagnosis present

## 2019-02-04 NOTE — Therapy (Signed)
[place] [facility] SERVICES [address] [place], [place], [address] Phone: [phone]   Fax:  [phone]  Physical Therapy Treatment/ [facility]ERT Patient Details  Name: Susan Callahan MRN: [id number] Date of Birth: [birth year] Referring Provider (PT): Francesco Sor   Encounter Date: 02/04/2019  PT End of Session - 02/05/19 1721    Visit Number  30    Number of Visits  38    Date for PT Re-Evaluation  03/05/19    Authorization Type  1/10 starting 11/21/2018    PT Start Time  1314    PT Stop Time  1400    PT Time Calculation (min)  46 min    Equipment Utilized During Treatment  Gait belt    Activity Tolerance  Patient tolerated treatment well    Behavior During Therapy  [facility] for tasks assessed/performed       Past Medical History:  Diagnosis Date  . COPD (chronic obstructive pulmonary disease) ([place])   . Depression 1974  . Diabetes mellitus without complication ([place])   . Hypertension   . Migraine   . 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 LUMBAR PERITONEAL SHUNT    . TYMPANOSTOMY TUBE PLACEMENT      There were no vitals filed for this visit.  Subjective Assessment - 02/05/19 0756    Subjective  Patient returning to outpatient PT after facility closure due to [place]. Since patient was seen last she has been diagnosed with breast cancer and had a biopsy and node removal, waiting for further results on June 2nd.     Pertinent History  Pain began in 2007 and has gradually got worse and has progressed to nonstop. Patient states pain last all day long, everyday with no change in symptoms. Medication helps briefly. States she has difficulty with showering and standing. States she has to stand with her knee bent due to pulling sensation in legs. Pain begins immediately when stands and begins to walk. Pain feels better when she bends her knees when standing. Pain became constant and non  stop around Dec 2018. Denies bowel or bladder changes, unexplained weakness, or unexpected weight loss/gain. She currently goes to pulmonary rehab 3x a week, but has increased pain with treadmill walking. Will walk with cane on occasion around house especially at night due to occasional dizziness and vision impairments in medical history. States she also has difficulty with carrying groceries up steps due to low back pain. She would like to be able to walk better and have less pain to be less reliant on medication.     Limitations  Lifting;Standing;Walking;House hold activities    How long can you stand comfortably?  pain begins immediately     How long can you walk comfortably?  pain begins immediately     Patient Stated Goals  walk better, have less pain    Currently in Pain?  Yes    Pain Score  9     Pain Location  Chest    Pain Orientation  Right    Pain Descriptors / Indicators  Sharp;Tender;Throbbing    Pain Type  Acute pain    Pain Radiating Towards  painful since nodule removal     Pain Onset  More than a month ago    Pain Frequency  Intermittent     Subjective:  Patient just found out she has breast cancer. Had a biopsy in April and node removal  last Thursday. Has worst pain in carrying objects up stairs.    GOALS:  VAS: worst pain 8/10;  Least amount of pain: 0/10, average: 4/10: L side worse Ambulate on treadmill 10 Mins: patient had to walk through [facility] for ~10 minutes.  BLE strength: grossly 3+/5 with pain with LLE movement into low back Strip bed: still challenging and painful  MODI: 63%   Observation:  rotated with l indicating L pain, noticeable swelling of legs and hands from new medication.    halpert, karen is PCP    Treatment: LE rotation: 2 minutes with overpressure for pain reduction Hamstring stretch 60 seconds each LE with increasing range with progressive time Heel toe nerve glide SLR: 20x each LE Df overpressure stretch : 60 seconds each  LE Posterior pelvic tilts x15  Patient is returning to PT after facility closure for COVID. Portion of patient's session was spent comforting patient due to recent diagnosis of breast cancer as well as providing patient with resources for new diagnosis.Patient demonstrates deficits in pain, strength, posture, ROM, and mobility as evidenced by slumped sitting posture, decreased endurance in postural muscles (TrA specifically), and presence of compensatory patterns during transitional postures and tasks. Patient's condition has the potential to improve in response to therapy. Maximum improvement is yet to be obtained. The anticipated improvement is attainable and reasonable in a generally predictable time. Patient will benefit from continued skilled therapeutic intervention to address remaining deficits in pain, posture, strength, and mobility in order to participate fully in ADLs with decreased pain, increase function, and improve overall QOL.                 PT Education - 02/05/19 1721    Education Details  goals, recert, POC,     Person(s) Educated  Patient    Methods  Explanation    Comprehension  Verbalized understanding       PT Short Term Goals - 02/05/19 1726      PT SHORT TERM GOAL #1   Title  Patient will be independent with completion of HEP to improve ability to complete functional tasks and functional ADLs.    Baseline  10/23: will give HEP next session 11/19: HEP compliance    Time  2    Period  Weeks    Status  Achieved      PT SHORT TERM GOAL #2   Title  Patient will report pain score less than 4/10 on VAS to demonstrate reduction of pain levels with functional tasks.     Baseline  10/23: 6/10 11/19: 5/10 12/12: 9/10 1/10: 7/10  2/11: 3/10     Time  2    Period  Weeks    Status  Achieved        PT Long Term Goals - 02/05/19 1726      PT LONG TERM GOAL #1   Title  Patient will report pain score of <3/10 on VAS for decreased pain levels and ease with  functional activities.    Baseline  10/23: 6/10 11/19: 5/10 12/12 9/10 1/10: 7/10 2/11: 3/10 3/9: 6/10  5/26: 8/10     Time  4    Period  Weeks    Status  On-going    Target Date  03/04/19      PT LONG TERM GOAL #2   Title  Patient will be able to ambulate on treadmill for 51mnutes without increase in back/leg pain demonstrating improved community ambulation and treadmill walking at pulmonary rehab  Baseline  10/23: 101mnutes increases pain 11/19: 85mutes 12/12: has not had the chance to perform 1/10: was able to do 5 minutes without pain 2/11: able to do 5 minutes then had to bend over 3/9: 5 minutes 30 seconds at 2.0 mphs 5/26: had to walk 10 minutes at cancer center with extreme pain but was able to perform     Time  4    Period  Weeks    Status  Partially Met    Target Date  03/04/19      PT LONG TERM GOAL #3   Title  Patient will have decreased modified oswestry score <50% for improved ability to complete household tasks with less pain levels.     Baseline  10/23: 62% 11/19: 42%      Time  4    Period  Weeks    Status  Achieved      PT LONG TERM GOAL #4   Title  Patient will score 4+/5 on BLE MMT to demonstrate improved glute strength and ability to carry groceries with increased ease up/down stairs at home.    Baseline  10/23: 4-/5 grossly 11/19: 4/5 grossly; hamstrings 4-/5 12/12: 4/5 gross 1/10: 4/5 gross 2/11: gross 4+/5 with hamstrings and abuductors 4-/5 3/9: gross 4+/5 hamstrings and abductors 4-/5 5/27: grossly 3+/5 with pain with LLE movements    Time  4    Period  Weeks    Status  On-going    Target Date  03/04/19      PT LONG TERM GOAL #5   Title  Patient will have decreased modified oswestry score <35% for improved ability to complete household tasks with less pain levels.     Baseline  11/19: 42% 12/12: 52% 1/10: 48%  2/11: 30%     Time  4    Period  Weeks    Status  Achieved      PT LONG TERM GOAL #6   Title  Patient will be able to make and/or strip the  bed without pain and without needing rest breaks to perform iADLs independently.     Baseline  2/11: pain limits ability to perform, ~4 breaks required 3/9: takes her time, 3 breaks 5/27: very painful still    Time  4    Period  Weeks    Status  Partially Met    Target Date  03/04/19      PT LONG TERM GOAL #7   Title  Patient will have decreased modified oswestry score <20% for improved ability to complete household tasks with less pain levels.     Baseline  2/11: 30%  3/9: 30% 5/27: 63%    Time  4    Period  Weeks    Status  On-going    Target Date  03/04/19            Plan - 02/05/19 1726    Clinical Impression Statement  Patient is returning to PT after facility closure for COVID. Portion of patient's session was spent comforting patient due to recent diagnosis of breast cancer as well as providing patient with resources for new diagnosis.Patient demonstrates deficits in pain, strength, posture, ROM, and mobility as evidenced by slumped sitting posture, decreased endurance in postural muscles (TrA specifically), and presence of compensatory patterns during transitional postures and tasks. Patient's condition has the potential to improve in response to therapy. Maximum improvement is yet to be obtained. The anticipated improvement is attainable and reasonable in a generally predictable time.  Patient will benefit from continued skilled therapeutic intervention to address remaining deficits in pain, posture, strength, and mobility in order to participate fully in ADLs with decreased pain, increase function, and improve overall QOL.    Rehab Potential  Fair    Clinical Impairments Affecting Rehab Potential  (+) motivation (-) chronicity of symptoms    PT Frequency  2x / week    PT Duration  4 weeks    PT Treatment/Interventions  ADLs/Self Care Home Management;Cryotherapy;Moist Heat;Traction;Iontophoresis 9m/ml Dexamethasone;Ultrasound;Gait training;Stair training;Functional mobility  training;Neuromuscular re-education;Balance training;Therapeutic exercise;Therapeutic activities;Patient/family education;Manual techniques;Energy conservation;Aquatic Therapy;Electrical Stimulation;Passive range of motion;Dry needling;Taping    PT Next Visit Plan  cross body coordination, progress core/postural stability, progress BLE strengthening    PT Home Exercise Plan  see instructions section from previous notes    Consulted and Agree with Plan of Care  Patient       Patient will benefit from skilled therapeutic intervention in order to improve the following deficits and impairments:  Abnormal gait, Cardiopulmonary status limiting activity, Decreased activity tolerance, Decreased balance, Decreased endurance, Decreased range of motion, Difficulty walking, Decreased strength, Hypomobility, Decreased mobility, Impaired perceived functional ability, Impaired flexibility, Postural dysfunction, Obesity, Improper body mechanics, Pain  Visit Diagnosis: Chronic low back pain, unspecified back pain laterality, unspecified whether sciatica present  Abnormal posture  Muscle weakness (generalized)  Other abnormalities of gait and mobility     Problem List Patient Active Problem List   Diagnosis Date Noted  . Long term current use of opiate analgesic 12/02/2018  . Tympanic membrane central perforation, left 11/04/2018  . DDD (degenerative disc disease), lumbosacral 10/10/2018  . Abnormal MRI, lumbar spine (08/29/2018) 10/10/2018  . 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 Lumbar nerve root impingement (Bilateral: L4 & L5) (L>R: L5) 10/10/2018  . Lumbar Spinal instability of L4-5 10/10/2018  . Lumbar facet hypertrophy 10/10/2018  . COPD (chronic obstructive pulmonary disease) with emphysema ([place] 08/28/2018  . Elevated rheumatoid factor 08/13/2018  . Elevated C-reactive protein (CRP) 08/12/2018  . Elevated sed rate 08/12/2018  . Lumbar facet  syndrome (Bilateral) 08/12/2018  . Back pain with left-sided radiculopathy 08/12/2018  . DDD (degenerative disc disease), lumbar 08/12/2018  . Chronic lower extremity pain (Primary Area of Pain) (Bilateral) (L>R) 07/23/2018  . Chronic low back pain (Secondary Area of Pain) (Bilateral) (L>R) w/ sciatica (Bilateral) 07/23/2018  . Chronic pain syndrome 07/23/2018  . Pharmacologic therapy 07/23/2018  . Disorder of skeletal system 07/23/2018  . Problems influencing health status 07/23/2018  . [facility]ac [facility] (Bilateral) (L>R) 07/23/2018  . Status post lumboperitoneal shunt placement 02/26/2018  . 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 Tympanic membrane perforation 05/30/2016  . PMB (postmenopausal bleeding) 04/26/2016  . Rash 12/27/2015  . 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 Plantar fasciitis of foot (Left) 05/26/2015  . Spondylosis of lumbar region without myelopathy or radiculopathy 05/26/2015  . Lumbar foraminal stenosis (L3-4, L4-5, L5-S1) (L>R) 05/26/2015  . Spinal stenosis of lumbar region with neurogenic claudication 05/26/2015  . Encounter for screening mammogram for malignant neoplasm of breast 12/02/2014  . Goiter 12/02/2014  . Chronic idiopathic gout of foot (Right) 07/06/2014  . Biceps tendonitis (Right) 05/19/2014  . Shoulder pain (Right) 05/19/2014  . Facial weakness 05/05/2014  . Post herpetic neuralgia 05/05/2014  . Chronic dysfunction of left eustachian tube 03/24/2014  . Osteopenia 03/13/2014  . Lumbar Grade 1 Anterolisthesis of  L3/4 (5m) & L4/5 (4-140m(w/ Dynamic instability) 03/13/2014  . Family history of colon cancer 12/02/2013  . Dyshidrotic eczema 11/17/2013  . Diastasis recti 04/08/2013  . Facial nerve paresis 07/31/2012  . Blepharospasm 05/08/2012  . 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 Obesity  03/28/2012  . Hemoptysis 12/22/2011  . Neuropathic postherpetic trigeminal neuralgia 12/12/2011  . Allergic rhinitis, mild 10/12/2011  . Disequilibrium syndrome 09/27/2011  . Chronic bronchitis ([facility]09/24/2012  . Essential hypertension 05/24/2011  . Type 2 diabetes mellitus without complications ([id number]. Pulmonary nodules 05/05/2011   MaJanna ArchPT, DPT   02/05/2019, 5:30 PM  CoSouth [place]AIN REKindred Hospital Baldwin ParkERVICES [address]dNarcissaNCAl[place]hone: [phone] Fax:  [phone]: Susan CarltonRN: [id number] of Birth: 6/Jun 20, 1951

## 2019-02-10 ENCOUNTER — Other Ambulatory Visit: Payer: Self-pay

## 2019-02-10 ENCOUNTER — Ambulatory Visit: Payer: Medicare Other | Attending: Rehabilitation

## 2019-02-10 DIAGNOSIS — M545 Low back pain, unspecified: Secondary | ICD-10-CM

## 2019-02-10 DIAGNOSIS — R2689 Other abnormalities of gait and mobility: Secondary | ICD-10-CM | POA: Insufficient documentation

## 2019-02-10 DIAGNOSIS — G8929 Other chronic pain: Secondary | ICD-10-CM | POA: Insufficient documentation

## 2019-02-10 DIAGNOSIS — M6281 Muscle weakness (generalized): Secondary | ICD-10-CM | POA: Insufficient documentation

## 2019-02-10 DIAGNOSIS — R293 Abnormal posture: Secondary | ICD-10-CM | POA: Insufficient documentation

## 2019-02-10 DIAGNOSIS — I972 Postmastectomy lymphedema syndrome: Secondary | ICD-10-CM | POA: Diagnosis present

## 2019-02-10 NOTE — Therapy (Signed)
[place] MAIN [facility] SERVICES [address] [place], [place], [address] Phone: [phone]   Fax:  [phone]  Physical Therapy Treatment  Patient Details  Name: Susan Callahan MRN: [id number] Date of Birth: 1951/02/26 Referring Provider (PT): Francesco Sor   Encounter Date: 02/10/2019  PT End of Session - 02/10/19 1434    Visit Number  31    Number of Visits  38    Date for PT Re-Evaluation  03/05/19    Authorization Type  2/10 starting 11/21/2018    PT Start Time  1415    PT Stop Time  1501    PT Time Calculation (min)  46 min    Activity Tolerance  Patient tolerated treatment well    Behavior During Therapy  [facility] for tasks assessed/performed       Past Medical History:  Diagnosis Date  . COPD (chronic obstructive pulmonary disease) ([place])   . Depression 1974  . Diabetes mellitus without complication ([place])   . Hypertension   . Migraine   . 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 LUMBAR PERITONEAL SHUNT    . TYMPANOSTOMY TUBE PLACEMENT      There were no vitals filed for this visit.  Subjective Assessment - 02/10/19 1430    Subjective  Patient reports her nodule pain in the R armpit that pain has been worsening. Will be getting a referral for lymphedema therapy. Reports she can't even make the bed now.     Pertinent History  Pain began in 2007 and has gradually got worse and has progressed to nonstop. Patient states pain last all day long, everyday with no change in symptoms. Medication helps briefly. States she has difficulty with showering and standing. States she has to stand with her knee bent due to pulling sensation in legs. Pain begins immediately when stands and begins to walk. Pain feels better when she bends her knees when standing. Pain became constant and non stop around Dec 2018. Denies bowel or bladder changes, unexplained weakness, or unexpected weight loss/gain.  She currently goes to pulmonary rehab 3x a week, but has increased pain with treadmill walking. Will walk with cane on occasion around house especially at night due to occasional dizziness and vision impairments in medical history. States she also has difficulty with carrying groceries up steps due to low back pain. She would like to be able to walk better and have less pain to be less reliant on medication.     Limitations  Lifting;Standing;Walking;House hold activities    How long can you stand comfortably?  pain begins immediately     How long can you walk comfortably?  pain begins immediately     Patient Stated Goals  walk better, have less pain    Currently in Pain?  Yes    Pain Score  9     Pain Location  Chest    Pain Orientation  Right    Pain Descriptors / Indicators  Tender;Throbbing    Pain Type  Acute pain    Pain Onset  More than a month ago    Pain Frequency  Intermittent    Pain Score  2    Pain Location  Back    Pain Orientation  Lower    Pain Descriptors / Indicators  Aching    Pain Type  Chronic pain    Pain Onset  More than a month ago  Pain Frequency  Constant          Manual Therapy  L/R single knee to chest stretch x 30s each LE; Hooklying HS stretch with DF/PF for sciatic nerve mobility x20 bilateral; Hamstring stretch in supine with increasing range 60 seconds each LE Roller to lumbar and thoracic paraspinals x 3 minutes.    Ther-ex: cueing for sequencing, positioning and muscle activation TrA activation in supine position 10x 3 second holds, cues for touching abdomen  Abduction with GTB in hooklying 3 second holds x 10; cues to engage core Adduction ball squeezes 10x 5 second holds  Marches in supine x 10 each LE with TrA activation. Verbal cues to complete slowly. Glute squeeze 5 second x 10 Posterior pelvic tilts x10. tactile and verbal cue for appropriate technique  Pt educated throughout session about proper posture and technique with exercises.  Improved exercise technique, movement at target joints, use of target muscles after min to mod verbal, visual, tactile cues                      PT Education - 02/10/19 1432    Education Details  exercise technique, manual, relaxation for pain relief    Person(s) Educated  Patient    Methods  Explanation;Demonstration;Tactile cues;Verbal cues    Comprehension  Verbalized understanding;Returned demonstration;Verbal cues required;Tactile cues required;Need further instruction       PT Short Term Goals - 02/05/19 1726      PT SHORT TERM GOAL #1   Title  Patient will be independent with completion of HEP to improve ability to complete functional tasks and functional ADLs.    Baseline  10/23: will give HEP next session 11/19: HEP compliance    Time  2    Period  Weeks    Status  Achieved      PT SHORT TERM GOAL #2   Title  Patient will report pain score less than 4/10 on VAS to demonstrate reduction of pain levels with functional tasks.     Baseline  10/23: 6/10 11/19: 5/10 12/12: 9/10 1/10: 7/10  2/11: 3/10     Time  2    Period  Weeks    Status  Achieved        PT Long Term Goals - 02/05/19 1726      PT LONG TERM GOAL #1   Title  Patient will report pain score of <3/10 on VAS for decreased pain levels and ease with functional activities.    Baseline  10/23: 6/10 11/19: 5/10 12/12 9/10 1/10: 7/10 2/11: 3/10 3/9: 6/10  5/26: 8/10     Time  4    Period  Weeks    Status  On-going    Target Date  03/04/19      PT LONG TERM GOAL #2   Title  Patient will be able to ambulate on treadmill for 76mnutes without increase in back/leg pain demonstrating improved community ambulation and treadmill walking at pulmonary rehab    Baseline  10/23: 035mutes increases pain 11/19: 23m33mtes 12/12: has not had the chance to perform 1/10: was able to do 5 minutes without pain 2/11: able to do 5 minutes then had to bend over 3/9: 5 minutes 30 seconds at 2.0 mphs 5/26: had to walk 10  minutes at cancer center with extreme pain but was able to perform     Time  4    Period  Weeks    Status  Partially Met  Target Date  03/04/19      PT LONG TERM GOAL #3   Title  Patient will have decreased modified oswestry score <50% for improved ability to complete household tasks with less pain levels.     Baseline  10/23: 62% 11/19: 42%      Time  4    Period  Weeks    Status  Achieved      PT LONG TERM GOAL #4   Title  Patient will score 4+/5 on BLE MMT to demonstrate improved glute strength and ability to carry groceries with increased ease up/down stairs at home.    Baseline  10/23: 4-/5 grossly 11/19: 4/5 grossly; hamstrings 4-/5 12/12: 4/5 gross 1/10: 4/5 gross 2/11: gross 4+/5 with hamstrings and abuductors 4-/5 3/9: gross 4+/5 hamstrings and abductors 4-/5 5/27: grossly 3+/5 with pain with LLE movements    Time  4    Period  Weeks    Status  On-going    Target Date  03/04/19      PT LONG TERM GOAL #5   Title  Patient will have decreased modified oswestry score <35% for improved ability to complete household tasks with less pain levels.     Baseline  11/19: 42% 12/12: 52% 1/10: 48%  2/11: 30%     Time  4    Period  Weeks    Status  Achieved      PT LONG TERM GOAL #6   Title  Patient will be able to make and/or strip the bed without pain and without needing rest breaks to perform iADLs independently.     Baseline  2/11: pain limits ability to perform, ~4 breaks required 3/9: takes her time, 3 breaks 5/27: very painful still    Time  4    Period  Weeks    Status  Partially Met    Target Date  03/04/19      PT LONG TERM GOAL #7   Title  Patient will have decreased modified oswestry score <20% for improved ability to complete household tasks with less pain levels.     Baseline  2/11: 30%  3/9: 30% 5/27: 63%    Time  4    Period  Weeks    Status  On-going    Target Date  03/04/19            Plan - 02/10/19 1439    Clinical Impression Statement  Patient is  having severe pain in R arm from surgery limiting positioning for session. Patient requires cueing for breathing and pain control for improving muscle tissue length. The patient acknowledges reduced pain by the end of session with and demonstrates improved muscle tissue lengthening and core stability following the performance of manual therapy and exercise. Patient will continue to benefit from skilled physical therapy to improve strength, to reduce pain levels with ADLs, and to improve quality of life.    Rehab Potential  Fair    Clinical Impairments Affecting Rehab Potential  (+) motivation (-) chronicity of symptoms    PT Frequency  2x / week    PT Duration  4 weeks    PT Treatment/Interventions  ADLs/Self Care Home Management;Cryotherapy;Moist Heat;Traction;Iontophoresis 74m/ml Dexamethasone;Ultrasound;Gait training;Stair training;Functional mobility training;Neuromuscular re-education;Balance training;Therapeutic exercise;Therapeutic activities;Patient/family education;Manual techniques;Energy conservation;Aquatic Therapy;Electrical Stimulation;Passive range of motion;Dry needling;Taping    PT Next Visit Plan  cross body coordination, progress core/postural stability, progress BLE strengthening    PT Home Exercise Plan  see instructions section from previous notes  Consulted and Agree with Plan of Care  Patient       Patient will benefit from skilled therapeutic intervention in order to improve the following deficits and impairments:  Abnormal gait, Cardiopulmonary status limiting activity, Decreased activity tolerance, Decreased balance, Decreased endurance, Decreased range of motion, Difficulty walking, Decreased strength, Hypomobility, Decreased mobility, Impaired perceived functional ability, Impaired flexibility, Postural dysfunction, Obesity, Improper body mechanics, Pain  Visit Diagnosis: Chronic low back pain, unspecified back pain laterality, unspecified whether sciatica  present  Abnormal posture  Muscle weakness (generalized)     Problem List Patient Active Problem List   Diagnosis Date Noted  . Long term current use of opiate analgesic 12/02/2018  . Tympanic membrane central perforation, left 11/04/2018  . DDD (degenerative disc disease), lumbosacral 10/10/2018  . Abnormal MRI, lumbar spine (08/29/2018) 10/10/2018  . 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 Lumbar nerve root impingement (Bilateral: L4 & L5) (L>R: L5) 10/10/2018  . Lumbar Spinal instability of L4-5 10/10/2018  . Lumbar facet hypertrophy 10/10/2018  . COPD (chronic obstructive pulmonary disease) with emphysema ([place]) 08/28/2018  . Elevated rheumatoid factor 08/13/2018  . Elevated C-reactive protein (CRP) 08/12/2018  . Elevated sed rate 08/12/2018  . Lumbar facet syndrome (Bilateral) 08/12/2018  . Back pain with left-sided radiculopathy 08/12/2018  . DDD (degenerative disc disease), lumbar 08/12/2018  . Chronic lower extremity pain (Primary Area of Pain) (Bilateral) (L>R) 07/23/2018  . Chronic low back pain (Secondary Area of Pain) (Bilateral) (L>R) w/ sciatica (Bilateral) 07/23/2018  . Chronic pain syndrome 07/23/2018  . Pharmacologic therapy 07/23/2018  . Disorder of skeletal system 07/23/2018  . Problems influencing health status 07/23/2018  . Chronic sacroiliac joint pain [facility] Area of Pain) (Bilateral) (L>R) 07/23/2018  . Status post lumboperitoneal shunt placement 02/26/2018  . 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 Tympanic membrane perforation 05/30/2016  . PMB (postmenopausal bleeding) 04/26/2016  . Rash 12/27/2015  . 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 Plantar fasciitis of foot (Left) 05/26/2015  . Spondylosis of lumbar region without myelopathy or radiculopathy 05/26/2015  . Lumbar foraminal stenosis (L3-4, L4-5, L5-S1) (L>R) 05/26/2015  .  Spinal stenosis of lumbar region with neurogenic claudication 05/26/2015  . Encounter for screening mammogram for malignant neoplasm of breast 12/02/2014  . Goiter 12/02/2014  . Chronic idiopathic gout of foot (Right) 07/06/2014  . Biceps tendonitis (Right) 05/19/2014  . Shoulder pain (Right) 05/19/2014  . Facial weakness 05/05/2014  . Post herpetic neuralgia 05/05/2014  . Chronic dysfunction of left eustachian tube 03/24/2014  . Osteopenia 03/13/2014  . Lumbar Grade 1 Anterolisthesis of L3/4 (13m) & L4/5 (4-166m(w/ Dynamic instability) 03/13/2014  . Family history of colon cancer 12/02/2013  . Dyshidrotic eczema 11/17/2013  . Diastasis recti 04/08/2013  . Facial nerve paresis 07/31/2012  . Blepharospasm 05/08/2012  . 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 Obesity 03/28/2012  . Hemoptysis 12/22/2011  . Neuropathic postherpetic trigeminal neuralgia 12/12/2011  . Allergic rhinitis, mild 10/12/2011  . Disequilibrium syndrome 09/27/2011  . Chronic bronchitis ([place]09/24/2012  . Essential hypertension 05/24/2011  . Type 2 diabetes mellitus without complications ([id number]. Pulmonary nodules 05/05/2011    MaJanna Arch/09/2018, 3:04 PM  [facility]ERV[facility] [address]hone: [phone] Fax:  [phone]: Susan SpangleRN: [id number] of  Birth: [birth year]

## 2019-02-12 ENCOUNTER — Ambulatory Visit: Payer: Medicare Other

## 2019-02-12 ENCOUNTER — Other Ambulatory Visit: Payer: Self-pay

## 2019-02-12 DIAGNOSIS — G8929 Other chronic pain: Secondary | ICD-10-CM

## 2019-02-12 DIAGNOSIS — R2689 Other abnormalities of gait and mobility: Secondary | ICD-10-CM

## 2019-02-12 DIAGNOSIS — M545 Low back pain: Secondary | ICD-10-CM | POA: Diagnosis not present

## 2019-02-12 DIAGNOSIS — R293 Abnormal posture: Secondary | ICD-10-CM

## 2019-02-12 DIAGNOSIS — Z170 Estrogen receptor positive status [ER+]: Secondary | ICD-10-CM | POA: Insufficient documentation

## 2019-02-12 DIAGNOSIS — M6281 Muscle weakness (generalized): Secondary | ICD-10-CM

## 2019-02-12 DIAGNOSIS — C50111 Malignant neoplasm of central portion of right female breast: Secondary | ICD-10-CM | POA: Insufficient documentation

## 2019-02-12 NOTE — Therapy (Signed)
[place] MAIN [facility] SERVICES [address] [place], [place], [address] Phone: [phone]   Fax:  [phone]  Physical Therapy Treatment  Patient Details  Name: Susan Callahan MRN: [id number] Date of Birth: 07-Jul-1951 Referring Provider (PT): Francesco Sor   Encounter Date: 02/12/2019  PT End of Session - 02/12/19 9450    Visit Number  32    Number of Visits  38    Date for PT Re-Evaluation  03/05/19    Authorization Type  3/10 starting 11/21/2018    PT Start Time  1415    PT Stop Time  1459    PT Time Calculation (min)  44 min    Activity Tolerance  Patient tolerated treatment well    Behavior During Therapy  [facility] for tasks assessed/performed       Past Medical History:  Diagnosis Date  . COPD (chronic obstructive pulmonary disease) ([place])   . Depression 1974  . Diabetes mellitus without complication ([place])   . Hypertension   . Migraine   . 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 LUMBAR PERITONEAL SHUNT    . TYMPANOSTOMY TUBE PLACEMENT      There were no vitals filed for this visit.  Subjective Assessment - 02/12/19 1434    Subjective  Patient went to [place] yesterday for further cancer sessions. Reports she had a hard time stepping down stairs with left foot, not being able to bend foot    Pertinent History  Pain began in 2007 and has gradually got worse and has progressed to nonstop. Patient states pain last all day long, everyday with no change in symptoms. Medication helps briefly. States she has difficulty with showering and standing. States she has to stand with her knee bent due to pulling sensation in legs. Pain begins immediately when stands and begins to walk. Pain feels better when she bends her knees when standing. Pain became constant and non stop around Dec 2018. Denies bowel or bladder changes, unexplained weakness, or unexpected weight loss/gain. She currently  goes to pulmonary rehab 3x a week, but has increased pain with treadmill walking. Will walk with cane on occasion around house especially at night due to occasional dizziness and vision impairments in medical history. States she also has difficulty with carrying groceries up steps due to low back pain. She would like to be able to walk better and have less pain to be less reliant on medication.     Limitations  Lifting;Standing;Walking;House hold activities    How long can you stand comfortably?  pain begins immediately     How long can you walk comfortably?  pain begins immediately     Patient Stated Goals  walk better, have less pain    Currently in Pain?  No/denies    Pain Score  7     Pain Location  Chest    Pain Orientation  Right    Pain Descriptors / Indicators  Aching;Tender;Throbbing    Pain Type  Acute pain    Pain Onset  More than a month ago    Pain Frequency  Intermittent    Pain Score  3    Pain Location  Back    Pain Orientation  Left;Lower    Pain Descriptors / Indicators  Aching    Pain Type  Acute pain;Chronic pain    Pain Onset  More than a month ago    Pain Frequency  Constant       Manual Therapy  L/R single knee to chest stretch x 30s each LE; Hooklying HS stretch with DF/PF for sciatic nerve mobility x20 bilateral; Hamstring stretch in supine with increasing range 60 seconds each LE Piriformis L stretch 60 second supine Roller to lumbar and thoracic paraspinals x 3 minutes.    Ther-ex: cueing for sequencing, positioning and muscle activation TrA activation in supine position 10x 3 second holds, cues for touching abdomen  Abduction with GTB in hooklying 3 second holds x 20 cues to engage core Adduction ball squeezes 10x 5 second holds  Marches in supine x 10 each LE with TrA activation. Verbal cues to complete slowly. Glute squeeze 5 second x 10 Posterior pelvic tilts x10. tactile and verbal cue for appropriate technique  seated ABC ankle strengthening each  foot  Pt educated throughout session about proper posture and technique with exercises. Improved exercise technique, movement at target joints, use of target muscles after min to mod verbal, visual, tactile cues                        PT Education - 02/12/19 1436    Education Details  exercise technique, manual, relaxation for pain relief    Person(s) Educated  Patient    Methods  Explanation;Demonstration;Tactile cues;Verbal cues    Comprehension  Verbalized understanding;Returned demonstration;Verbal cues required;Tactile cues required       PT Short Term Goals - 02/05/19 1726      PT SHORT TERM GOAL #1   Title  Patient will be independent with completion of HEP to improve ability to complete functional tasks and functional ADLs.    Baseline  10/23: will give HEP next session 11/19: HEP compliance    Time  2    Period  Weeks    Status  Achieved      PT SHORT TERM GOAL #2   Title  Patient will report pain score less than 4/10 on VAS to demonstrate reduction of pain levels with functional tasks.     Baseline  10/23: 6/10 11/19: 5/10 12/12: 9/10 1/10: 7/10  2/11: 3/10     Time  2    Period  Weeks    Status  Achieved        PT Long Term Goals - 02/05/19 1726      PT LONG TERM GOAL #1   Title  Patient will report pain score of <3/10 on VAS for decreased pain levels and ease with functional activities.    Baseline  10/23: 6/10 11/19: 5/10 12/12 9/10 1/10: 7/10 2/11: 3/10 3/9: 6/10  5/26: 8/10     Time  4    Period  Weeks    Status  On-going    Target Date  03/04/19      PT LONG TERM GOAL #2   Title  Patient will be able to ambulate on treadmill for 75mnutes without increase in back/leg pain demonstrating improved community ambulation and treadmill walking at pulmonary rehab    Baseline  10/23: 063mutes increases pain 11/19: 28m53mtes 12/12: has not had the chance to perform 1/10: was able to do 5 minutes without pain 2/11: able to do 5 minutes then had to  bend over 3/9: 5 minutes 30 seconds at 2.0 mphs 5/26: had to walk 10 minutes at cancer center with extreme pain but was able to perform     Time  4    Period  Weeks  Status  Partially Met    Target Date  03/04/19      PT LONG TERM GOAL #3   Title  Patient will have decreased modified oswestry score <50% for improved ability to complete household tasks with less pain levels.     Baseline  10/23: 62% 11/19: 42%      Time  4    Period  Weeks    Status  Achieved      PT LONG TERM GOAL #4   Title  Patient will score 4+/5 on BLE MMT to demonstrate improved glute strength and ability to carry groceries with increased ease up/down stairs at home.    Baseline  10/23: 4-/5 grossly 11/19: 4/5 grossly; hamstrings 4-/5 12/12: 4/5 gross 1/10: 4/5 gross 2/11: gross 4+/5 with hamstrings and abuductors 4-/5 3/9: gross 4+/5 hamstrings and abductors 4-/5 5/27: grossly 3+/5 with pain with LLE movements    Time  4    Period  Weeks    Status  On-going    Target Date  03/04/19      PT LONG TERM GOAL #5   Title  Patient will have decreased modified oswestry score <35% for improved ability to complete household tasks with less pain levels.     Baseline  11/19: 42% 12/12: 52% 1/10: 48%  2/11: 30%     Time  4    Period  Weeks    Status  Achieved      PT LONG TERM GOAL #6   Title  Patient will be able to make and/or strip the bed without pain and without needing rest breaks to perform iADLs independently.     Baseline  2/11: pain limits ability to perform, ~4 breaks required 3/9: takes her time, 3 breaks 5/27: very painful still    Time  4    Period  Weeks    Status  Partially Met    Target Date  03/04/19      PT LONG TERM GOAL #7   Title  Patient will have decreased modified oswestry score <20% for improved ability to complete household tasks with less pain levels.     Baseline  2/11: 30%  3/9: 30% 5/27: 63%    Time  4    Period  Weeks    Status  On-going    Target Date  03/04/19             Plan - 02/12/19 1444    Clinical Impression Statement  Patient presents with good motivation. L foot assessed for mobility, able to perform full df against gravity indicating stair issue yesterday could potentially be from a muscle coordination, control, or eccentric stability issue. Patient LLE fatigued quicker than RLE with interventions. Patient will continue to benefit from skilled physical therapy to improve strength, to reduce pain levels with ADLs, and to improve quality of life.    Rehab Potential  Fair    Clinical Impairments Affecting Rehab Potential  (+) motivation (-) chronicity of symptoms    PT Frequency  2x / week    PT Duration  4 weeks    PT Treatment/Interventions  ADLs/Self Care Home Management;Cryotherapy;Moist Heat;Traction;Iontophoresis 8m/ml Dexamethasone;Ultrasound;Gait training;Stair training;Functional mobility training;Neuromuscular re-education;Balance training;Therapeutic exercise;Therapeutic activities;Patient/family education;Manual techniques;Energy conservation;Aquatic Therapy;Electrical Stimulation;Passive range of motion;Dry needling;Taping    PT Next Visit Plan  cross body coordination, progress core/postural stability, progress BLE strengthening    PT Home Exercise Plan  see instructions section from previous notes    Consulted and Agree with Plan of  Care  Patient       Patient will benefit from skilled therapeutic intervention in order to improve the following deficits and impairments:  Abnormal gait, Cardiopulmonary status limiting activity, Decreased activity tolerance, Decreased balance, Decreased endurance, Decreased range of motion, Difficulty walking, Decreased strength, Hypomobility, Decreased mobility, Impaired perceived functional ability, Impaired flexibility, Postural dysfunction, Obesity, Improper body mechanics, Pain  Visit Diagnosis: Chronic low back pain, unspecified back pain laterality, unspecified whether sciatica  present  Abnormal posture  Muscle weakness (generalized)  Other abnormalities of gait and mobility     Problem List Patient Active Problem List   Diagnosis Date Noted  . Long term current use of opiate analgesic 12/02/2018  . Tympanic membrane central perforation, left 11/04/2018  . DDD (degenerative disc disease), lumbosacral 10/10/2018  . Abnormal MRI, lumbar spine (08/29/2018) 10/10/2018  . 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 Lumbar nerve root impingement (Bilateral: L4 & L5) (L>R: L5) 10/10/2018  . Lumbar Spinal instability of L4-5 10/10/2018  . Lumbar facet hypertrophy 10/10/2018  . COPD (chronic obstructive pulmonary disease) with emphysema ([place]) 08/28/2018  . Elevated rheumatoid factor 08/13/2018  . Elevated C-reactive protein (CRP) 08/12/2018  . Elevated sed rate 08/12/2018  . Lumbar facet syndrome (Bilateral) 08/12/2018  . Back pain with left-sided radiculopathy 08/12/2018  . DDD (degenerative disc disease), lumbar 08/12/2018  . Chronic lower extremity pain (Primary Area of Pain) (Bilateral) (L>R) 07/23/2018  . Chronic low back pain (Secondary Area of Pain) (Bilateral) (L>R) w/ sciatica (Bilateral) 07/23/2018  . Chronic pain syndrome 07/23/2018  . Pharmacologic therapy 07/23/2018  . Disorder of skeletal system 07/23/2018  . Problems influencing health status 07/23/2018  . Chronic sacroiliac joint pain [facility] Area of Pain) (Bilateral) (L>R) 07/23/2018  . Status post lumboperitoneal shunt placement 02/26/2018  . 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 Tympanic membrane perforation 05/30/2016  . PMB (postmenopausal bleeding) 04/26/2016  . Rash 12/27/2015  . 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 Plantar fasciitis of foot (Left) 05/26/2015  . Spondylosis of lumbar region without myelopathy or radiculopathy 05/26/2015  . Lumbar foraminal stenosis  (L3-4, L4-5, L5-S1) (L>R) 05/26/2015  . Spinal stenosis of lumbar region with neurogenic claudication 05/26/2015  . Encounter for screening mammogram for malignant neoplasm of breast 12/02/2014  . Goiter 12/02/2014  . Chronic idiopathic gout of foot (Right) 07/06/2014  . Biceps tendonitis (Right) 05/19/2014  . Shoulder pain (Right) 05/19/2014  . Facial weakness 05/05/2014  . Post herpetic neuralgia 05/05/2014  . Chronic dysfunction of left eustachian tube 03/24/2014  . Osteopenia 03/13/2014  . Lumbar Grade 1 Anterolisthesis of L3/4 (68m) & L4/5 (4-122m(w/ Dynamic instability) 03/13/2014  . Family history of colon cancer 12/02/2013  . Dyshidrotic eczema 11/17/2013  . Diastasis recti 04/08/2013  . Facial nerve paresis 07/31/2012  . Blepharospasm 05/08/2012  . 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 Obesity 03/28/2012  . Hemoptysis 12/22/2011  . Neuropathic postherpetic trigeminal neuralgia 12/12/2011  . Allergic rhinitis, mild 10/12/2011  . Disequilibrium syndrome 09/27/2011  . Chronic bronchitis ([place]09/24/2012  . Essential hypertension 05/24/2011  . Type 2 diabetes mellitus without complications ([id number]. Pulmonary nodules 05/05/2011    MaJanna ArchPT, DPT   02/12/2019, 3:06 PM  [place] RECoven[facility] CenterERVICES [address]dLamontNC[place]hone: [phone] Fax:  [phone]: LiKhristi Schiller  Callahan MRN: [id number] Date of Birth: 1950/10/23

## 2019-02-17 ENCOUNTER — Ambulatory Visit: Payer: Medicare Other

## 2019-02-17 ENCOUNTER — Other Ambulatory Visit: Payer: Self-pay

## 2019-02-17 DIAGNOSIS — R293 Abnormal posture: Secondary | ICD-10-CM

## 2019-02-17 DIAGNOSIS — M6281 Muscle weakness (generalized): Secondary | ICD-10-CM

## 2019-02-17 DIAGNOSIS — M545 Low back pain, unspecified: Secondary | ICD-10-CM

## 2019-02-17 DIAGNOSIS — G8929 Other chronic pain: Secondary | ICD-10-CM

## 2019-02-17 NOTE — Therapy (Signed)
[facility] SERVICES [address] [place], [place], [address] Phone: [phone]   Fax:  [phone]  Physical Therapy Treatment  Patient Details  Name: Susan Callahan MRN: [id number] Date of Birth: June 15, 1951 Referring Provider (PT): Francesco Sor   Encounter Date: 02/17/2019  PT End of Session - 02/17/19 1540    Visit Number  33    Number of Visits  38    Date for PT Re-Evaluation  03/05/19    Authorization Type  4/10 starting 11/21/2018    PT Start Time  1515    PT Stop Time  1601    PT Time Calculation (min)  46 min    Activity Tolerance  Patient tolerated treatment well    Behavior During Therapy  [facility] for tasks assessed/performed       Past Medical History:  Diagnosis Date  . COPD (chronic obstructive pulmonary disease) ([place])   . Depression 1974  . Diabetes mellitus without complication ([place])   . Hypertension   . Migraine   . 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 LUMBAR PERITONEAL SHUNT    . TYMPANOSTOMY TUBE PLACEMENT      There were no vitals filed for this visit.  Subjective Assessment - 02/17/19 1538    Subjective  Patient reports compliance with HEP, has been having back and chest pain from attempting to garden. Has been challenged with stairs and making the bed.     Pertinent History  Pain began in 2007 and has gradually got worse and has progressed to nonstop. Patient states pain last all day long, everyday with no change in symptoms. Medication helps briefly. States she has difficulty with showering and standing. States she has to stand with her knee bent due to pulling sensation in legs. Pain begins immediately when stands and begins to walk. Pain feels better when she bends her knees when standing. Pain became constant and non stop around Dec 2018. Denies bowel or bladder changes, unexplained weakness, or unexpected weight loss/gain. She currently  goes to pulmonary rehab 3x a week, but has increased pain with treadmill walking. Will walk with cane on occasion around house especially at night due to occasional dizziness and vision impairments in medical history. States she also has difficulty with carrying groceries up steps due to low back pain. She would like to be able to walk better and have less pain to be less reliant on medication.     Limitations  Lifting;Standing;Walking;House hold activities    How long can you stand comfortably?  pain begins immediately     How long can you walk comfortably?  pain begins immediately     Patient Stated Goals  walk better, have less pain    Currently in Pain?  Yes    Pain Score  8     Pain Location  Chest    Pain Orientation  Right    Pain Descriptors / Indicators  Aching;Tender    Pain Type  Acute pain    Pain Radiating Towards  painful since nodule removal     Pain Onset  1 to 4 weeks ago    Pain Frequency  Intermittent    Pain Score  4    Pain Location  Back    Pain Orientation  Left;Lower    Pain Descriptors / Indicators  Aching    Pain Type  Acute pain;Chronic pain    Pain Onset  More than a month ago    Pain Frequency  Constant          Manual Therapy  L/R single knee to chest stretch x 30s each LE; Hooklying HS stretch with DF/PF for sciatic nerve mobility x20 bilateral; Hamstring stretch in supine with increasing range 60 seconds each LE Piriformis L stretch 60 second supine Roller to lumbar and thoracic paraspinals x 3 minutes.   SAD with belt LLE 3x 20 seconds inferior direction LAD with belt LLE 3x20 seconds inferior direction  Ther-ex: cueing for sequencing, positioning and muscle activation TrA activation in supine position 10x 3 second holds, cues for touching abdomen  Abduction with RTB in hooklying 3 second holds x 20 cues to engage core Adduction ball squeezes 20x 5 second holds  Marches in supine RTB x 10 each LE with TrA activation. Verbal cues to complete  slowly. Glute squeeze 5 second x 10 Posterior pelvic tilts x10. tactile and verbal cue for appropriate technique 5 seconds with hold, improved alignment  seated ABC ankle strengthening each foot   Pt educated throughout session about proper posture and technique with exercises. Improved exercise technique, movement at target joints, use of target muscles after min to mod verbal, visual, tactile cues                        PT Education - 02/17/19 1540    Education Details  exercise technique, manual, relaxation techniques    Person(s) Educated  Patient    Methods  Explanation;Demonstration;Tactile cues;Verbal cues    Comprehension  Verbalized understanding;Returned demonstration;Verbal cues required;Tactile cues required;Need further instruction       PT Short Term Goals - 02/05/19 1726      PT SHORT TERM GOAL #1   Title  Patient will be independent with completion of HEP to improve ability to complete functional tasks and functional ADLs.    Baseline  10/23: will give HEP next session 11/19: HEP compliance    Time  2    Period  Weeks    Status  Achieved      PT SHORT TERM GOAL #2   Title  Patient will report pain score less than 4/10 on VAS to demonstrate reduction of pain levels with functional tasks.     Baseline  10/23: 6/10 11/19: 5/10 12/12: 9/10 1/10: 7/10  2/11: 3/10     Time  2    Period  Weeks    Status  Achieved        PT Long Term Goals - 02/05/19 1726      PT LONG TERM GOAL #1   Title  Patient will report pain score of <3/10 on VAS for decreased pain levels and ease with functional activities.    Baseline  10/23: 6/10 11/19: 5/10 12/12 9/10 1/10: 7/10 2/11: 3/10 3/9: 6/10  5/26: 8/10     Time  4    Period  Weeks    Status  On-going    Target Date  03/04/19      PT LONG TERM GOAL #2   Title  Patient will be able to ambulate on treadmill for 774mnutes without increase in back/leg pain demonstrating improved community ambulation and treadmill  walking at pulmonary rehab    Baseline  10/23: 034mutes increases pain 11/19: 74m71mtes 12/12: has not had the chance to perform 1/10: was able to do 5 minutes without pain 2/11: able to do 5 minutes then had to bend over 3/9:  5 minutes 30 seconds at 2.0 mphs 5/26: had to walk 10 minutes at cancer center with extreme pain but was able to perform     Time  4    Period  Weeks    Status  Partially Met    Target Date  03/04/19      PT LONG TERM GOAL #3   Title  Patient will have decreased modified oswestry score <50% for improved ability to complete household tasks with less pain levels.     Baseline  10/23: 62% 11/19: 42%      Time  4    Period  Weeks    Status  Achieved      PT LONG TERM GOAL #4   Title  Patient will score 4+/5 on BLE MMT to demonstrate improved glute strength and ability to carry groceries with increased ease up/down stairs at home.    Baseline  10/23: 4-/5 grossly 11/19: 4/5 grossly; hamstrings 4-/5 12/12: 4/5 gross 1/10: 4/5 gross 2/11: gross 4+/5 with hamstrings and abuductors 4-/5 3/9: gross 4+/5 hamstrings and abductors 4-/5 5/27: grossly 3+/5 with pain with LLE movements    Time  4    Period  Weeks    Status  On-going    Target Date  03/04/19      PT LONG TERM GOAL #5   Title  Patient will have decreased modified oswestry score <35% for improved ability to complete household tasks with less pain levels.     Baseline  11/19: 42% 12/12: 52% 1/10: 48%  2/11: 30%     Time  4    Period  Weeks    Status  Achieved      PT LONG TERM GOAL #6   Title  Patient will be able to make and/or strip the bed without pain and without needing rest breaks to perform iADLs independently.     Baseline  2/11: pain limits ability to perform, ~4 breaks required 3/9: takes her time, 3 breaks 5/27: very painful still    Time  4    Period  Weeks    Status  Partially Met    Target Date  03/04/19      PT LONG TERM GOAL #7   Title  Patient will have decreased modified oswestry score <20%  for improved ability to complete household tasks with less pain levels.     Baseline  2/11: 30%  3/9: 30% 5/27: 63%    Time  4    Period  Weeks    Status  On-going    Target Date  03/04/19            Plan - 02/17/19 1544    Clinical Impression Statement  Patient presents with increased fatigue/pain from attempted gardening and housework since last session.Continues to have good motivation. Distraction with belt improved pelvic alignment and muscle coordination/technique. Patient will continue to benefit from skilled physical therapy to improve strength, to reduce pain levels with ADLs, and to improve quality of life.    Rehab Potential  Fair    Clinical Impairments Affecting Rehab Potential  (+) motivation (-) chronicity of symptoms    PT Frequency  2x / week    PT Duration  4 weeks    PT Treatment/Interventions  ADLs/Self Care Home Management;Cryotherapy;Moist Heat;Traction;Iontophoresis 24m/ml Dexamethasone;Ultrasound;Gait training;Stair training;Functional mobility training;Neuromuscular re-education;Balance training;Therapeutic exercise;Therapeutic activities;Patient/family education;Manual techniques;Energy conservation;Aquatic Therapy;Electrical Stimulation;Passive range of motion;Dry needling;Taping    PT Next Visit Plan  cross body coordination, progress core/postural stability, progress  BLE strengthening    PT Home Exercise Plan  see instructions section from previous notes    Consulted and Agree with Plan of Care  Patient       Patient will benefit from skilled therapeutic intervention in order to improve the following deficits and impairments:  Abnormal gait, Cardiopulmonary status limiting activity, Decreased activity tolerance, Decreased balance, Decreased endurance, Decreased range of motion, Difficulty walking, Decreased strength, Hypomobility, Decreased mobility, Impaired perceived functional ability, Impaired flexibility, Postural dysfunction, Obesity, Improper body  mechanics, Pain  Visit Diagnosis: Chronic low back pain, unspecified back pain laterality, unspecified whether sciatica present  Abnormal posture  Muscle weakness (generalized)     Problem List Patient Active Problem List   Diagnosis Date Noted  . Long term current use of opiate analgesic 12/02/2018  . Tympanic membrane central perforation, left 11/04/2018  . DDD (degenerative disc disease), lumbosacral 10/10/2018  . Abnormal MRI, lumbar spine (08/29/2018) 10/10/2018  . 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 Lumbar nerve root impingement (Bilateral: L4 & L5) (L>R: L5) 10/10/2018  . Lumbar Spinal instability of L4-5 10/10/2018  . Lumbar facet hypertrophy 10/10/2018  . COPD (chronic obstructive pulmonary disease) with emphysema ([place]) 08/28/2018  . Elevated rheumatoid factor 08/13/2018  . Elevated C-reactive protein (CRP) 08/12/2018  . Elevated sed rate 08/12/2018  . Lumbar facet syndrome (Bilateral) 08/12/2018  . Back pain with left-sided radiculopathy 08/12/2018  . DDD (degenerative disc disease), lumbar 08/12/2018  . Chronic lower extremity pain (Primary Area of Pain) (Bilateral) (L>R) 07/23/2018  . Chronic low back pain (Secondary Area of Pain) (Bilateral) (L>R) w/ sciatica (Bilateral) 07/23/2018  . Chronic pain syndrome 07/23/2018  . Pharmacologic therapy 07/23/2018  . Disorder of skeletal system 07/23/2018  . Problems influencing health status 07/23/2018  . Chronic sacroiliac joint pain [facility] Area of Pain) (Bilateral) (L>R) 07/23/2018  . Status post lumboperitoneal shunt placement 02/26/2018  . 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 Tympanic membrane perforation 05/30/2016  . PMB (postmenopausal bleeding) 04/26/2016  . Rash 12/27/2015  . 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 Plantar fasciitis of foot (Left) 05/26/2015  . Spondylosis of lumbar  region without myelopathy or radiculopathy 05/26/2015  . Lumbar foraminal stenosis (L3-4, L4-5, L5-S1) (L>R) 05/26/2015  . Spinal stenosis of lumbar region with neurogenic claudication 05/26/2015  . Encounter for screening mammogram for malignant neoplasm of breast 12/02/2014  . Goiter 12/02/2014  . Chronic idiopathic gout of foot (Right) 07/06/2014  . Biceps tendonitis (Right) 05/19/2014  . Shoulder pain (Right) 05/19/2014  . Facial weakness 05/05/2014  . Post herpetic neuralgia 05/05/2014  . Chronic dysfunction of left eustachian tube 03/24/2014  . Osteopenia 03/13/2014  . Lumbar Grade 1 Anterolisthesis of L3/4 (41m) & L4/5 (4-186m(w/ Dynamic instability) 03/13/2014  . Family history of colon cancer 12/02/2013  . Dyshidrotic eczema 11/17/2013  . Diastasis recti 04/08/2013  . Facial nerve paresis 07/31/2012  . Blepharospasm 05/08/2012  . 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 Obesity 03/28/2012  . Hemoptysis 12/22/2011  . Neuropathic postherpetic trigeminal neuralgia 12/12/2011  . Allergic rhinitis, mild 10/12/2011  . Disequilibrium syndrome 09/27/2011  . Chronic [place]itis ([place] 109/24/2012  . Essential hypertension 05/24/2011  . Type 2 diabetes mellitus without complications ([id number]. Pulmonary nodules 05/05/2011   MaJanna ArchPT, DPT   02/17/2019, 4:03 PM  [facility] [phone]  [place], [place], [address] Phone: [phone]   Fax:  [phone]  Name: Susan Callahan MRN: [id number] Date of Birth: 25-Feb-1951

## 2019-02-19 ENCOUNTER — Ambulatory Visit: Payer: Medicare Other

## 2019-02-19 ENCOUNTER — Ambulatory Visit: Payer: Medicare Other | Admitting: Occupational Therapy

## 2019-02-19 ENCOUNTER — Encounter: Payer: Self-pay | Admitting: Occupational Therapy

## 2019-02-19 ENCOUNTER — Other Ambulatory Visit: Payer: Self-pay

## 2019-02-19 DIAGNOSIS — G8929 Other chronic pain: Secondary | ICD-10-CM

## 2019-02-19 DIAGNOSIS — R2689 Other abnormalities of gait and mobility: Secondary | ICD-10-CM

## 2019-02-19 DIAGNOSIS — Z9889 Other specified postprocedural states: Secondary | ICD-10-CM | POA: Insufficient documentation

## 2019-02-19 DIAGNOSIS — M6281 Muscle weakness (generalized): Secondary | ICD-10-CM

## 2019-02-19 DIAGNOSIS — M545 Low back pain: Secondary | ICD-10-CM | POA: Diagnosis not present

## 2019-02-19 DIAGNOSIS — I972 Postmastectomy lymphedema syndrome: Secondary | ICD-10-CM

## 2019-02-19 DIAGNOSIS — R293 Abnormal posture: Secondary | ICD-10-CM

## 2019-02-19 NOTE — Therapy (Signed)
[place] MAIN [facility] SERVICES [address] [place], [place], [address] Phone: [phone]   Fax:  [phone]  Physical Therapy Treatment  Patient Details  Name: Susan Callahan MRN: [id number] Date of Birth: [birth year] Referring Provider (PT): Francesco Sor   Encounter Date: 02/19/2019  PT End of Session - 02/19/19 1433    Visit Number  34    Number of Visits  38    Date for PT Re-Evaluation  03/05/19    Authorization Type  5/10 starting 11/21/2018    PT Start Time  1410    PT Stop Time  1457    PT Time Calculation (min)  47 min    Equipment Utilized During Treatment  Gait belt    Activity Tolerance  Patient tolerated treatment well    Behavior During Therapy  [facility] for tasks assessed/performed       Past Medical History:  Diagnosis Date  . COPD (chronic obstructive pulmonary disease) ([place])   . Depression 1974  . Diabetes mellitus without complication ([place])   . Hypertension   . Migraine   . 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 LUMBAR PERITONEAL SHUNT    . TYMPANOSTOMY TUBE PLACEMENT      There were no vitals filed for this visit.  Subjective Assessment - 02/19/19 1432    Subjective  Patient reports having increased low back pain on L>R. Has started lorazepam for anxiety.      Pertinent History  Pain began in 2007 and has gradually got worse and has progressed to nonstop. Patient states pain last all day long, everyday with no change in symptoms. Medication helps briefly. States she has difficulty with showering and standing. States she has to stand with her knee bent due to pulling sensation in legs. Pain begins immediately when stands and begins to walk. Pain feels better when she bends her knees when standing. Pain became constant and non stop around Dec 2018. Denies bowel or bladder changes, unexplained weakness, or unexpected weight loss/gain. She currently goes to  pulmonary rehab 3x a week, but has increased pain with treadmill walking. Will walk with cane on occasion around house especially at night due to occasional dizziness and vision impairments in medical history. States she also has difficulty with carrying groceries up steps due to low back pain. She would like to be able to walk better and have less pain to be less reliant on medication.     Limitations  Lifting;Standing;Walking;House hold activities    How long can you stand comfortably?  pain begins immediately     How long can you walk comfortably?  pain begins immediately     Patient Stated Goals  walk better, have less pain    Currently in Pain?  Yes    Pain Score  6     Pain Location  Chest    Pain Orientation  Right    Pain Descriptors / Indicators  Aching    Pain Type  Acute pain    Pain Onset  1 to 4 weeks ago    Pain Frequency  Intermittent    Pain Score  6    Pain Location  Back    Pain Orientation  Left;Lower    Pain Descriptors / Indicators  Aching    Pain Type  Acute pain;Chronic pain    Pain Onset  More than a month ago    Pain Frequency  Intermittent           Manual Therapy  L/R single knee to chest stretch x 30s each LE; Hooklying HS stretch with DF/PF for sciatic nerve mobility x20 bilateral; Hamstring stretch in supine with increasing range 60 seconds each LE Piriformis L stretch 60 second supine Roller to lumbar and thoracic paraspinals x 3 minutes.   SAD with belt LLE 3x 20 seconds inferior direction LAD with belt LLE 3x20 seconds inferior direction   Ther-ex: cueing for sequencing, positioning and muscle activation TrA activation in supine position 10x 3 second holds, cues for touching abdomen  TrA activation with lateral knee abduction in hooklying for optimal muscle recruitment 10x.  Abduction with RTB in hooklying 3 second holds x 20 cues to engage core Adduction ball squeezes 20x 5 second holds   Glute squeeze 5 second x 10 Posterior pelvic tilts  x15. tactile and verbal cue for appropriate technique 5 seconds with hold, improved alignment   seated ABC ankle strengthening each foot  Ambulate length of gym with focus on heel strike of LLE, 4x with increasing gait mechanics with repetition    Pt educated throughout session about proper posture and technique with exercises. Improved exercise technique, movement at target joints, use of target muscles after min to mod verbal, visual, tactile cues                        PT Education - 02/19/19 1433    Education Details  exercise technique, manual, relaxation techniques    Person(s) Educated  Patient    Methods  Explanation;Tactile cues;Verbal cues;Demonstration    Comprehension  Verbalized understanding;Returned demonstration;Verbal cues required;Tactile cues required;Need further instruction       PT Short Term Goals - 02/05/19 1726      PT SHORT TERM GOAL #1   Title  Patient will be independent with completion of HEP to improve ability to complete functional tasks and functional ADLs.    Baseline  10/23: will give HEP next session 11/19: HEP compliance    Time  2    Period  Weeks    Status  Achieved      PT SHORT TERM GOAL #2   Title  Patient will report pain score less than 4/10 on VAS to demonstrate reduction of pain levels with functional tasks.     Baseline  10/23: 6/10 11/19: 5/10 12/12: 9/10 1/10: 7/10  2/11: 3/10     Time  2    Period  Weeks    Status  Achieved        PT Long Term Goals - 02/05/19 1726      PT LONG TERM GOAL #1   Title  Patient will report pain score of <3/10 on VAS for decreased pain levels and ease with functional activities.    Baseline  10/23: 6/10 11/19: 5/10 12/12 9/10 1/10: 7/10 2/11: 3/10 3/9: 6/10  5/26: 8/10     Time  4    Period  Weeks    Status  On-going    Target Date  03/04/19      PT LONG TERM GOAL #2   Title  Patient will be able to ambulate on treadmill for 49mnutes without increase in back/leg pain  demonstrating improved community ambulation and treadmill walking at pulmonary rehab    Baseline  10/23: 06mutes increases pain 11/19: 60m51mtes 12/12: has not had the chance to perform 1/10: was able to do 5 minutes without pain 2/11:  able to do 5 minutes then had to bend over 3/9: 5 minutes 30 seconds at 2.0 mphs 5/26: had to walk 10 minutes at cancer center with extreme pain but was able to perform     Time  4    Period  Weeks    Status  Partially Met    Target Date  03/04/19      PT LONG TERM GOAL #3   Title  Patient will have decreased modified oswestry score <50% for improved ability to complete household tasks with less pain levels.     Baseline  10/23: 62% 11/19: 42%      Time  4    Period  Weeks    Status  Achieved      PT LONG TERM GOAL #4   Title  Patient will score 4+/5 on BLE MMT to demonstrate improved glute strength and ability to carry groceries with increased ease up/down stairs at home.    Baseline  10/23: 4-/5 grossly 11/19: 4/5 grossly; hamstrings 4-/5 12/12: 4/5 gross 1/10: 4/5 gross 2/11: gross 4+/5 with hamstrings and abuductors 4-/5 3/9: gross 4+/5 hamstrings and abductors 4-/5 5/27: grossly 3+/5 with pain with LLE movements    Time  4    Period  Weeks    Status  On-going    Target Date  03/04/19      PT LONG TERM GOAL #5   Title  Patient will have decreased modified oswestry score <35% for improved ability to complete household tasks with less pain levels.     Baseline  11/19: 42% 12/12: 52% 1/10: 48%  2/11: 30%     Time  4    Period  Weeks    Status  Achieved      PT LONG TERM GOAL #6   Title  Patient will be able to make and/or strip the bed without pain and without needing rest breaks to perform iADLs independently.     Baseline  2/11: pain limits ability to perform, ~4 breaks required 3/9: takes her time, 3 breaks 5/27: very painful still    Time  4    Period  Weeks    Status  Partially Met    Target Date  03/04/19      PT LONG TERM GOAL #7   Title   Patient will have decreased modified oswestry score <20% for improved ability to complete household tasks with less pain levels.     Baseline  2/11: 30%  3/9: 30% 5/27: 63%    Time  4    Period  Weeks    Status  On-going    Target Date  03/04/19            Plan - 02/19/19 1439    Clinical Impression Statement  Patient initially presented with increased pain due to life stress/activities. Pain reduced by end of session with additional improvements of gait mechanics with decreased antalgia and improved weight acceptance onto LLE. Patient will continue to benefit from skilled physical therapy to improve strength, to reduce pain levels with ADLs, and to improve quality of life.    Rehab Potential  Fair    Clinical Impairments Affecting Rehab Potential  (+) motivation (-) chronicity of symptoms    PT Frequency  2x / week    PT Duration  4 weeks    PT Treatment/Interventions  ADLs/Self Care Home Management;Cryotherapy;Moist Heat;Traction;Iontophoresis 60m/ml Dexamethasone;Ultrasound;Gait training;Stair training;Functional mobility training;Neuromuscular re-education;Balance training;Therapeutic exercise;Therapeutic activities;Patient/family education;Manual techniques;Energy conservation;Aquatic Therapy;Electrical Stimulation;Passive range of motion;Dry  needling;Taping    PT Next Visit Plan  cross body coordination, progress core/postural stability, progress BLE strengthening    PT Home Exercise Plan  see instructions section from previous notes    Consulted and Agree with Plan of Care  Patient       Patient will benefit from skilled therapeutic intervention in order to improve the following deficits and impairments:  Abnormal gait, Cardiopulmonary status limiting activity, Decreased activity tolerance, Decreased balance, Decreased endurance, Decreased range of motion, Difficulty walking, Decreased strength, Hypomobility, Decreased mobility, Impaired perceived functional ability, Impaired  flexibility, Postural dysfunction, Obesity, Improper body mechanics, Pain  Visit Diagnosis: Chronic low back pain, unspecified back pain laterality, unspecified whether sciatica present  Abnormal posture  Muscle weakness (generalized)  Other abnormalities of gait and mobility     Problem List Patient Active Problem List   Diagnosis Date Noted  . Long term current use of opiate analgesic 12/02/2018  . Tympanic membrane central perforation, left 11/04/2018  . DDD (degenerative disc disease), lumbosacral 10/10/2018  . Abnormal MRI, lumbar spine (08/29/2018) 10/10/2018  . 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 Lumbar nerve root impingement (Bilateral: L4 & L5) (L>R: L5) 10/10/2018  . Lumbar Spinal instability of L4-5 10/10/2018  . Lumbar facet hypertrophy 10/10/2018  . COPD (chronic obstructive pulmonary disease) with emphysema ([place]) 08/28/2018  . Elevated rheumatoid factor 08/13/2018  . Elevated C-reactive protein (CRP) 08/12/2018  . Elevated sed rate 08/12/2018  . Lumbar facet syndrome (Bilateral) 08/12/2018  . Back pain with left-sided radiculopathy 08/12/2018  . DDD (degenerative disc disease), lumbar 08/12/2018  . Chronic lower extremity pain (Primary Area of Pain) (Bilateral) (L>R) 07/23/2018  . Chronic low back pain (Secondary Area of Pain) (Bilateral) (L>R) w/ sciatica (Bilateral) 07/23/2018  . Chronic pain syndrome 07/23/2018  . Pharmacologic therapy 07/23/2018  . Disorder of skeletal system 07/23/2018  . Problems influencing health status 07/23/2018  . Chronic sacroiliac joint pain [facility] Area of Pain) (Bilateral) (L>R) 07/23/2018  . Status post lumboperitoneal shunt placement 02/26/2018  . 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 Tympanic membrane perforation 05/30/2016  . PMB (postmenopausal bleeding) 04/26/2016  . Rash 12/27/2015  . Intervertebral disc stenosis of neural canal of  lumbar region 05/26/2015  . Plantar fasciitis of foot (Left) 05/26/2015  . Spondylosis of lumbar region without myelopathy or radiculopathy 05/26/2015  . Lumbar foraminal stenosis (L3-4, L4-5, L5-S1) (L>R) 05/26/2015  . Spinal stenosis of lumbar region with neurogenic claudication 05/26/2015  . Encounter for screening mammogram for malignant neoplasm of breast 12/02/2014  . Goiter 12/02/2014  . Chronic idiopathic gout of foot (Right) 07/06/2014  . Biceps tendonitis (Right) 05/19/2014  . Shoulder pain (Right) 05/19/2014  . Facial weakness 05/05/2014  . Post herpetic neuralgia 05/05/2014  . Chronic dysfunction of left eustachian tube 03/24/2014  . Osteopenia 03/13/2014  . Lumbar Grade 1 Anterolisthesis of L3/4 (53m) & L4/5 (4-133m(w/ Dynamic instability) 03/13/2014  . Family history of colon cancer 12/02/2013  . Dyshidrotic eczema 11/17/2013  . Diastasis recti 04/08/2013  . Facial nerve paresis 07/31/2012  . Blepharospasm 05/08/2012  . 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 Obesity 03/28/2012  . Hemoptysis 12/22/2011  . Neuropathic postherpetic trigeminal neuralgia 12/12/2011  . Allergic rhinitis, mild 10/12/2011  . Disequilibrium syndrome 09/27/2011  . Chronic bronchitis (H[place]09/24/2012  . Essential hypertension 05/24/2011  . Type 2 diabetes mellitus without complications ([id number]. Pulmonary nodules  05/05/2011   Janna Arch, PT, DPT   02/19/2019, 3:06 PM  [place] MAIN [facility] SERVICES [address] [place], [place], [address] Phone: [phone]   Fax:  [phone]  Name: Aarin Sparkman MRN: [id number] Date of Birth: Feb 21, 1951

## 2019-02-20 NOTE — Therapy (Signed)
[place] MAIN [facility] SERVICES [address] [place], [place], [address] Phone: [phone]   Fax:  [phone]  Occupational Therapy Evaluation  Patient Details  Name: Susan Callahan MRN: [id number] Date of Birth: 1951/07/08 Referring Provider (OT): Humberto Leep, MD   Encounter Date: 02/19/2019  OT End of Session - 02/19/19 1629    Visit Number  1    Number of Visits  36    Date for OT Re-Evaluation  05/20/19    OT Start Time  0320    OT Stop Time  0420    OT Time Calculation (min)  60 min    Activity Tolerance  Patient tolerated treatment well;No increased pain    Behavior During Therapy  WFL for tasks assessed/performed       Past Medical History:  Diagnosis Date  . COPD (chronic obstructive pulmonary disease) ([place])   . Depression 1974  . Diabetes mellitus without complication ([place])   . Hypertension   . Migraine   . 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 LUMBAR PERITONEAL SHUNT    . TYMPANOSTOMY TUBE PLACEMENT      There were no vitals filed for this visit.  Subjective Assessment - 02/20/19 1240    Pain Onset  1 to 4 weeks ago    Pain Onset  More than a month ago        [facility] OT Assessment - 02/20/19 0001      Assessment   Medical Diagnosis  RUE/RUQ, Stage 0 post-mastectomy lymphedema, Mild R axillary web syndrome     Referring Provider (OT)  Humberto Leep, MD    Onset Date/Surgical Date  01/09/19    Hand Dominance  Right      Precautions   Precautions  Fall;Other (comment)   LE skin precautions (DM), LE infection precautions      LYMPHEDEMA/ONCOLOGY QUESTIONNAIRE - 02/20/19 1711      Surgeries   Mastectomy Date  01/09/19    Sentinel Lymph Node Biopsy Date  01/30/19    Number Lymph Nodes Removed  --   0+/2     Date Lymphedema/Swelling Started   Date  01/30/19      Treatment   Past Chemotherapy Treatment  No    Active Radiation Treatment  No    Past  Radiation Treatment  No    Current Hormone Treatment  Yes    Drug Name  letrozole      What other symptoms do you have   Are you Having Heaviness or Tightness  Yes    Are you having Pain  Yes    Are you having pitting edema  No    Is it Hard or Difficult finding clothes that fit  No    Do you have infections  No    Is there Decreased scar mobility  No    Stemmer Sign  No    Other Symptoms  --   pulling, tightness, R axxillary pain     Lymphedema Stage   Stage  STAGE 1 SPONTANEOUSLY REVERSIBLE      Lymphedema Assessments   Lymphedema Assessments  Upper extremities   comparative limb volumetrics, physical exam     Right Upper Extremity Lymphedema   Other  RUE limb volume = 2792.03 ml    Other  limb volume differential = 3.69%, R>L      Left Upper Extremity Lymphedema   Other  LUE limb volume =  2689.06 ml             OT Treatments/Exercises (OP) - 02/20/19 0001      Transfers   Transfers  Sit to Stand    Sit to Stand  6: Modified independent (Device/Increase time);From chair/3-in-1;With armrests      ADLs   Grooming  deficit w/ hair and nail care    UB Dressing  deficits due to painful UE AROM    LB Dressing  deficit 2/2 painful UE AROM    Toileting  difficult 2/2 painful reaching    Bathing  deficits 2/2 painful RUE AROM    Cooking  deficit 2/2 painful RUE AROM    Home Maintenance  deficits 2/2 painful RUE AROM    Driving  painful    Work  impaired when reaching, lifting, carrying    Leisure  impaired when reaching, lifting, carrying      Manual Therapy   Manual Therapy  Edema management;Soft tissue mobilization    Soft tissue mobilization  palpable cording-mild                 OT Long Term Goals - 02/20/19 1650      OT LONG TERM GOAL #1   Title  Pt independent with lymphedema precautions and prevention strategies to limit LE progression.    Baseline  Max A    Time  4    Period  Days    Status  New    Target Date  --   4th OT Rx visit      OT LONG TERM GOAL #2   Title  Pt will be independent with lymphedema self-care home program and AWS ther ex to improve functional performance of basic and instrumental ADLs by DC.    Baseline  Max A    Time  12    Period  Weeks    Status  New    Target Date  05/20/19   4th OT Rx visit     OT LONG TERM GOAL #3   Title  Pt wil report 0/10 pain with RUE functional use ( reaching, lifting, carrying, dressing, dressing, etc) to achieve independent , pain free ADLs performance.    Baseline  Max A    Time  12    Period  Weeks    Status  New    Target Date  05/20/19            Plan - 02/19/19 1647    Clinical Impression Statement  Susan Callahan is a 68 y o female presenting with stage 1, RUE/RUQ, lymphedema s/p R partial mastectomy and SNLB, 0+/2 LN. Pt also presents with painful R axillary Web Syndrome with palpable cording in R axilla and volar arm extending beyond  antecubital fossa. BUE comparative limb volumetrics reveal typical limb volumes WNL with 3. 69% greater volume measured in the dominant, right, treatment arm when compared with the LUE. There is a small localized swelling associated with a suspected seroma adjacent to the surgical scar in the R axilla. Pain and swelling noted above limit R AROM and functional arm use when performing basic and instrumental ADLs, productive activities, leisure pursuits and social participation. Susan Callahan will benefit from skilled Occupational Therapy to reduce pain, increase AROM, improve functional performance in all occupational domains, and limit progression of post-mastectomy lymphedema. Without OT chronic , progressive lymphedema and shoulder complications may persist resulting in further functional decline.    OT Occupational Profile and History  Comprehensive  Assessment- Review of records and extensive additional review of physical, cognitive, psychosocial history related to current functional performance    Occupational performance  deficits (Please refer to evaluation for details):  ADL's;IADL's;Social Participation;Leisure;Rest and Sleep;Work    Marketing executive / Function / Physical Skills  ADL;Decreased knowledge of precautions;ROM;UE functional use;Decreased knowledge of use of DME;Edema;Skin integrity;Pain;IADL    Rehab Potential  Good    Clinical Decision Making  Several treatment options, min-mod task modification necessary    Comorbidities Affecting Occupational Performance:  Presence of comorbidities impacting occupational performance    Comorbidities impacting occupational performance description:  see SUBJUECTIVE    Modification or Assistance to Complete Evaluation   Min-Moderate modification of tasks or assist with assess necessary to complete eval    OT Frequency  2x / week    OT Duration  12 weeks    OT Treatment/Interventions  Self-care/ADL training;Therapeutic exercise;Manual lymph drainage;Patient/family education;Therapeutic activities;DME and/or AE instruction;Manual Therapy;Other (comment)   consider fitting with prophylactic cmopression arm sleeve   Plan  BUE AROM ther ex to reduce axillary cording- 2 sets of 10 2-3 x daily    Consulted and Agree with Plan of Care  Patient       Patient will benefit from skilled therapeutic intervention in order to improve the following deficits and impairments:  Body Structure / Function / Physical Skills  Visit Diagnosis: Postmastectomy lymphedema syndrome - Plan: Ot plan of care cert/re-cert    Problem List Patient Active Problem List   Diagnosis Date Noted  . Long term current use of opiate analgesic 12/02/2018  . Tympanic membrane central perforation, left 11/04/2018  . DDD (degenerative disc disease), lumbosacral 10/10/2018  . Abnormal MRI, lumbar spine (08/29/2018) 10/10/2018  . 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 Lumbar nerve root impingement (Bilateral: L4 & L5) (L>R: L5) 10/10/2018  . Lumbar Spinal  instability of L4-5 10/10/2018  . Lumbar facet hypertrophy 10/10/2018  . COPD (chronic obstructive pulmonary disease) with emphysema ([place]) 08/28/2018  . Elevated rheumatoid factor 08/13/2018  . Elevated C-reactive protein (CRP) 08/12/2018  . Elevated sed rate 08/12/2018  . Lumbar facet syndrome (Bilateral) 08/12/2018  . Back pain with left-sided radiculopathy 08/12/2018  . DDD (degenerative disc disease), lumbar 08/12/2018  . Chronic lower extremity pain (Primary Area of Pain) (Bilateral) (L>R) 07/23/2018  . Chronic low back pain (Secondary Area of Pain) (Bilateral) (L>R) w/ sciatica (Bilateral) 07/23/2018  . Chronic pain syndrome 07/23/2018  . Pharmacologic therapy 07/23/2018  . Disorder of skeletal system 07/23/2018  . Problems influencing health status 07/23/2018  . Chronic sacroiliac joint pain [facility] Area of Pain) (Bilateral) (L>R) 07/23/2018  . Status post lumboperitoneal shunt placement 02/26/2018  . 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 Tympanic membrane perforation 05/30/2016  . PMB (postmenopausal bleeding) 04/26/2016  . Rash 12/27/2015  . 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 Plantar fasciitis of foot (Left) 05/26/2015  . Spondylosis of lumbar region without myelopathy or radiculopathy 05/26/2015  . Lumbar foraminal stenosis (L3-4, L4-5, L5-S1) (L>R) 05/26/2015  . Spinal stenosis of lumbar region with neurogenic claudication 05/26/2015  . Encounter for screening mammogram for malignant neoplasm of breast 12/02/2014  . Goiter 12/02/2014  . Chronic idiopathic gout of foot (Right) 07/06/2014  . Biceps tendonitis (Right) 05/19/2014  . Shoulder pain (Right) 05/19/2014  . Facial weakness 05/05/2014  . Post herpetic neuralgia 05/05/2014  . Chronic dysfunction of left eustachian tube 03/24/2014  .  Osteopenia 03/13/2014  . Lumbar Grade 1 Anterolisthesis of L3/4 (16mm) & L4/5 (4-67mm)(w/ Dynamic  instability) 03/13/2014  . Family history of colon cancer 12/02/2013  . Dyshidrotic eczema 11/17/2013  . Diastasis recti 04/08/2013  . Facial nerve paresis 07/31/2012  . Blepharospasm 05/08/2012  . 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 Obesity 03/28/2012  . Hemoptysis 12/22/2011  . Neuropathic postherpetic trigeminal neuralgia 12/12/2011  . Allergic rhinitis, mild 10/12/2011  . Disequilibrium syndrome 09/27/2011  . Chronic bronchitis ([place]) 06/05/2011  . Essential hypertension 05/24/2011  . Type 2 diabetes mellitus without complications ([place]) [id number]/5038  . Pulmonary nodules 05/05/2011   Susan Spearman, MS, OTR/L, [facility] 02/20/19 5:23 PM    [place] MAIN [facility] SERVICES [address] [place], [place], [address] Phone: [phone]   Fax:  [phone]  Name: Susan Callahan MRN: [id number] Date of Birth: 12-Feb-1951

## 2019-02-24 ENCOUNTER — Other Ambulatory Visit: Payer: Self-pay

## 2019-02-24 ENCOUNTER — Ambulatory Visit: Payer: Medicare Other

## 2019-02-24 DIAGNOSIS — M545 Low back pain: Secondary | ICD-10-CM | POA: Diagnosis not present

## 2019-02-24 DIAGNOSIS — G8929 Other chronic pain: Secondary | ICD-10-CM

## 2019-02-24 DIAGNOSIS — M6281 Muscle weakness (generalized): Secondary | ICD-10-CM

## 2019-02-24 DIAGNOSIS — R293 Abnormal posture: Secondary | ICD-10-CM

## 2019-02-24 NOTE — Therapy (Signed)
[place] MAIN [facility] SERVICES [address] [place], [place], [address] Phone: [phone]   Fax:  [phone]  Physical Therapy Treatment  Patient Details  Name: Susan Callahan MRN: [id number] Date of Birth: 1951-02-02 Referring Provider (PT): Francesco Sor   Encounter Date: 02/24/2019  PT End of Session - 02/24/19 1538    Visit Number  35    Number of Visits  38    Date for PT Re-Evaluation  03/05/19    Authorization Type  6/10 starting 11/21/2018    PT Start Time  1515    PT Stop Time  1559    PT Time Calculation (min)  44 min    Equipment Utilized During Treatment  Gait belt    Activity Tolerance  Patient tolerated treatment well    Behavior During Therapy  [facility] for tasks assessed/performed       Past Medical History:  Diagnosis Date  . COPD (chronic obstructive pulmonary disease) ([place])   . Depression 1974  . Diabetes mellitus without complication ([place])   . Hypertension   . Migraine   . 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 LUMBAR PERITONEAL SHUNT    . TYMPANOSTOMY TUBE PLACEMENT      There were no vitals filed for this visit.  Subjective Assessment - 02/24/19 1519    Subjective  Patient reports the back of her legs have been cramping and tight more with increased soreness. Patient reports she worked in the yard and is "paying for it" now.    Pertinent History  Pain began in 2007 and has gradually got worse and has progressed to nonstop. Patient states pain last all day long, everyday with no change in symptoms. Medication helps briefly. States she has difficulty with showering and standing. States she has to stand with her knee bent due to pulling sensation in legs. Pain begins immediately when stands and begins to walk. Pain feels better when she bends her knees when standing. Pain became constant and non stop around Dec 2018. Denies bowel or bladder changes,  unexplained weakness, or unexpected weight loss/gain. She currently goes to pulmonary rehab 3x a week, but has increased pain with treadmill walking. Will walk with cane on occasion around house especially at night due to occasional dizziness and vision impairments in medical history. States she also has difficulty with carrying groceries up steps due to low back pain. She would like to be able to walk better and have less pain to be less reliant on medication.     Limitations  Lifting;Standing;Walking;House hold activities    How long can you stand comfortably?  pain begins immediately     How long can you walk comfortably?  pain begins immediately     Patient Stated Goals  walk better, have less pain    Currently in Pain?  Yes    Pain Score  6     Pain Location  Chest    Pain Orientation  Right    Pain Descriptors / Indicators  Aching    Pain Type  Acute pain    Pain Onset  1 to 4 weeks ago    Pain Score  6    Pain Location  Back    Pain Orientation  Left;Lower    Pain Descriptors / Indicators  Aching    Pain Type  Acute pain;Chronic pain    Pain Onset  More than a month ago  Pain Frequency  Intermittent           Manual Therapy  L/R single knee to chest stretch x 30s each LE;  Hamstring stretch in supine with increasing range 60 seconds each LE Piriformis L stretch 60 second supine Roller to lumbar and thoracic paraspinals x 3 minutes.   SAD with belt LLE 5x 20 seconds inferior direction LAD with belt LLE 5x20 seconds inferior direction Calf rollout with "the stick" 2 minutes each calf Hamstring rollout with " the stick" 2 minutes each calf    Ther-ex: cueing for sequencing, positioning and muscle activation TrA activation in supine position 10x 3 second holds, cues for touching abdomen   Abduction with RTB in hooklying 3 second holds x 20 cues to engage core Adduction ball squeezes 20x 5 second holds   Hooklying HS stretch with DF/PF for sciatic nerve mobility x20  bilateral; Glute squeeze 5 second x 10 Posterior pelvic tilts x15. tactile and verbal cue for appropriate technique 5 seconds with hold, improved alignment     Ambulate length of gym with focus on heel strike of LLE, 4x with increasing gait mechanics with repetition    Pt educated throughout session about proper posture and technique with exercises. Improved exercise technique, movement at target joints, use of target muscles after min to mod verbal, visual, tactile cues                       PT Education - 02/24/19 1538    Education Details  exercise technique, manual, heel strike with ambulation    Person(s) Educated  Patient    Methods  Explanation;Demonstration;Tactile cues;Verbal cues    Comprehension  Verbalized understanding;Returned demonstration;Verbal cues required;Tactile cues required       PT Short Term Goals - 02/05/19 1726      PT SHORT TERM GOAL #1   Title  Patient will be independent with completion of HEP to improve ability to complete functional tasks and functional ADLs.    Baseline  10/23: will give HEP next session 11/19: HEP compliance    Time  2    Period  Weeks    Status  Achieved      PT SHORT TERM GOAL #2   Title  Patient will report pain score less than 4/10 on VAS to demonstrate reduction of pain levels with functional tasks.     Baseline  10/23: 6/10 11/19: 5/10 12/12: 9/10 1/10: 7/10  2/11: 3/10     Time  2    Period  Weeks    Status  Achieved        PT Long Term Goals - 02/05/19 1726      PT LONG TERM GOAL #1   Title  Patient will report pain score of <3/10 on VAS for decreased pain levels and ease with functional activities.    Baseline  10/23: 6/10 11/19: 5/10 12/12 9/10 1/10: 7/10 2/11: 3/10 3/9: 6/10  5/26: 8/10     Time  4    Period  Weeks    Status  On-going    Target Date  03/04/19      PT LONG TERM GOAL #2   Title  Patient will be able to ambulate on treadmill for 33mnutes without increase in back/leg pain  demonstrating improved community ambulation and treadmill walking at pulmonary rehab    Baseline  10/23: 063mutes increases pain 11/19: 60m64mtes 12/12: has not had the chance to perform 1/10: was able to do  5 minutes without pain 2/11: able to do 5 minutes then had to bend over 3/9: 5 minutes 30 seconds at 2.0 mphs 5/26: had to walk 10 minutes at cancer center with extreme pain but was able to perform     Time  4    Period  Weeks    Status  Partially Met    Target Date  03/04/19      PT LONG TERM GOAL #3   Title  Patient will have decreased modified oswestry score <50% for improved ability to complete household tasks with less pain levels.     Baseline  10/23: 62% 11/19: 42%      Time  4    Period  Weeks    Status  Achieved      PT LONG TERM GOAL #4   Title  Patient will score 4+/5 on BLE MMT to demonstrate improved glute strength and ability to carry groceries with increased ease up/down stairs at home.    Baseline  10/23: 4-/5 grossly 11/19: 4/5 grossly; hamstrings 4-/5 12/12: 4/5 gross 1/10: 4/5 gross 2/11: gross 4+/5 with hamstrings and abuductors 4-/5 3/9: gross 4+/5 hamstrings and abductors 4-/5 5/27: grossly 3+/5 with pain with LLE movements    Time  4    Period  Weeks    Status  On-going    Target Date  03/04/19      PT LONG TERM GOAL #5   Title  Patient will have decreased modified oswestry score <35% for improved ability to complete household tasks with less pain levels.     Baseline  11/19: 42% 12/12: 52% 1/10: 48%  2/11: 30%     Time  4    Period  Weeks    Status  Achieved      PT LONG TERM GOAL #6   Title  Patient will be able to make and/or strip the bed without pain and without needing rest breaks to perform iADLs independently.     Baseline  2/11: pain limits ability to perform, ~4 breaks required 3/9: takes her time, 3 breaks 5/27: very painful still    Time  4    Period  Weeks    Status  Partially Met    Target Date  03/04/19      PT LONG TERM GOAL #7   Title   Patient will have decreased modified oswestry score <20% for improved ability to complete household tasks with less pain levels.     Baseline  2/11: 30%  3/9: 30% 5/27: 63%    Time  4    Period  Weeks    Status  On-going    Target Date  03/04/19            Plan - 02/24/19 1545    Clinical Impression Statement  Patient presented with increased pain and soreness due to recent gardening/yard work and stress from diagnosis. Patient has been compliant with HEP and has noticed improved gait mechanics with repetition/verbal cueing.The patient acknowledges reduced pain by the end of session with and demonstrates improved muscle tissue lengthening and core stability following the performance of manual therapy and exercise.  Patient will continue to benefit from skilled physical therapy to improve strength, to reduce pain levels with ADLs, and to improve quality of life.    Rehab Potential  Fair    Clinical Impairments Affecting Rehab Potential  (+) motivation (-) chronicity of symptoms    PT Frequency  2x / week    PT  Duration  4 weeks    PT Treatment/Interventions  ADLs/Self Care Home Management;Cryotherapy;Moist Heat;Traction;Iontophoresis 37m/ml Dexamethasone;Ultrasound;Gait training;Stair training;Functional mobility training;Neuromuscular re-education;Balance training;Therapeutic exercise;Therapeutic activities;Patient/family education;Manual techniques;Energy conservation;Aquatic Therapy;Electrical Stimulation;Passive range of motion;Dry needling;Taping    PT Next Visit Plan  cross body coordination, progress core/postural stability, progress BLE strengthening    PT Home Exercise Plan  see instructions section from previous notes    Consulted and Agree with Plan of Care  Patient       Patient will benefit from skilled therapeutic intervention in order to improve the following deficits and impairments:  Abnormal gait, Cardiopulmonary status limiting activity, Decreased activity tolerance,  Decreased balance, Decreased endurance, Decreased range of motion, Difficulty walking, Decreased strength, Hypomobility, Decreased mobility, Impaired perceived functional ability, Impaired flexibility, Postural dysfunction, Obesity, Improper body mechanics, Pain  Visit Diagnosis: Chronic low back pain, unspecified back pain laterality, unspecified whether sciatica present - Plan:  Abnormal posture - Plan:   Muscle weakness (generalized) - Plan:     Problem List Patient Active Problem List   Diagnosis Date Noted  . Long term current use of opiate analgesic 12/02/2018  . Tympanic membrane central perforation, left 11/04/2018  . DDD (degenerative disc disease), lumbosacral 10/10/2018  . Abnormal MRI, lumbar spine (08/29/2018) 10/10/2018  . 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 Lumbar nerve root impingement (Bilateral: L4 & L5) (L>R: L5) 10/10/2018  . Lumbar Spinal instability of L4-5 10/10/2018  . Lumbar facet hypertrophy 10/10/2018  . COPD (chronic obstructive pulmonary disease) with emphysema ([facility] 08/28/2018  . Elevated rheumatoid factor 08/13/2018  . Elevated C-reactive protein (CRP) 08/12/2018  . Elevated sed rate 08/12/2018  . Lumbar facet syndrome (Bilateral) 08/12/2018  . Back pain with left-sided radiculopathy 08/12/2018  . DDD (degenerative disc disease), lumbar 08/12/2018  . Chronic lower extremity pain (Primary Area of Pain) (Bilateral) (L>R) 07/23/2018  . Chronic low back pain (Secondary Area of Pain) (Bilateral) (L>R) w/ sciatica (Bilateral) 07/23/2018  . Chronic pain syndrome 07/23/2018  . Pharmacologic therapy 07/23/2018  . Disorder of skeletal system 07/23/2018  . Problems influencing health status 07/23/2018  . [facility] (B[facility]) (L>R) 07/23/2018  . Status post lumboperitoneal shunt placement 02/26/2018  . Osteoarthritis of first carpometacarpal joint of hand (Left)  05/04/2017  . Tympanic membrane perforation 05/30/2016  . PMB (postmenopausal bleeding) 04/26/2016  . Rash 12/27/2015  . 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 Plantar fasciitis of foot (Left) 05/26/2015  . Spondylosis of lumbar region without myelopathy or radiculopathy 05/26/2015  . Lumbar foraminal stenosis (L3-4, L4-5, L5-S1) (L>R) 05/26/2015  . Spinal stenosis of lumbar region with neurogenic claudication 05/26/2015  . Encounter for screening mammogram for malignant neoplasm of breast 12/02/2014  . Goiter 12/02/2014  . Chronic idiopathic gout of foot (Right) 07/06/2014  . Biceps tendonitis (Right) 05/19/2014  . Shoulder pain (Right) 05/19/2014  . Facial weakness 05/05/2014  . Post herpetic neuralgia 05/05/2014  . Chronic dysfunction of left eustachian tube 03/24/2014  . Osteopenia 03/13/2014  . Lumbar Grade 1 Anterolisthesis of L3/4 (298m & L4/5 (4-1072mw/ Dynamic instability) 03/13/2014  . Family history of colon cancer 12/02/2013  . Dyshidrotic eczema 11/17/2013  . Diastasis recti 04/08/2013  . Facial nerve paresis 07/31/2012  . Blepharospasm 05/08/2012  . 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 Obesity 03/28/2012  . Hemoptysis 12/22/2011  . Neuropathic postherpetic trigeminal neuralgia 12/12/2011  . Allergic  rhinitis, mild 10/12/2011  . Disequilibrium syndrome 09/27/2011  . Chronic bronchitis ([place]) 06/05/2011  . Essential hypertension 05/24/2011  . Type 2 diabetes mellitus without complications ([place]) 91/63/8466  . Pulmonary nodules 05/05/2011    Janna Arch, PT, DPT   02/24/2019, 4:02 PM  [place] MAIN [facility] SERVICES [address] [place], [place], [address] Phone: [phone]   Fax:  [phone]  Name: Susan Callahan MRN: [id number] Date of Birth: March 02, 1951

## 2019-02-26 ENCOUNTER — Ambulatory Visit: Payer: Medicare Other | Admitting: Occupational Therapy

## 2019-02-26 ENCOUNTER — Ambulatory Visit: Payer: Medicare Other

## 2019-02-26 ENCOUNTER — Other Ambulatory Visit: Payer: Self-pay

## 2019-02-26 ENCOUNTER — Encounter: Payer: Self-pay | Admitting: Occupational Therapy

## 2019-02-26 DIAGNOSIS — M545 Low back pain, unspecified: Secondary | ICD-10-CM

## 2019-02-26 DIAGNOSIS — R293 Abnormal posture: Secondary | ICD-10-CM

## 2019-02-26 DIAGNOSIS — M6281 Muscle weakness (generalized): Secondary | ICD-10-CM

## 2019-02-26 DIAGNOSIS — G8929 Other chronic pain: Secondary | ICD-10-CM

## 2019-02-26 DIAGNOSIS — I972 Postmastectomy lymphedema syndrome: Secondary | ICD-10-CM

## 2019-02-26 NOTE — Therapy (Signed)
[place] MAIN [facility] SERVICES [address] [place], [place], [address] Phone: [phone]   Fax:  [phone]  Patient Details  Name: Susan Callahan MRN: [id number] Date of Birth: 21-Jul-1951 Referring Provider:  Rulon Sera, MD  Encounter Date: 02/26/2019   Andrey Spearman, MS, OTR/L, [facility] 02/26/19 4:46 PM  [place] MAIN [facility] SERVICES [address] [place], [place], [address] Phone: [phone]   Fax:  [phone]

## 2019-02-26 NOTE — Therapy (Signed)
[place] MAIN [facility] SERVICES [address] [place], [place], [address] Phone: [phone]   Fax:  [phone]  Physical Therapy Treatment  Patient Details  Name: Susan Callahan MRN: [id number] Date of Birth: 04/10/1951 Referring Provider (PT): Francesco Sor   Encounter Date: 02/26/2019  PT End of Session - 02/26/19 1508    Visit Number  36    Number of Visits  38    Date for PT Re-Evaluation  03/05/19    Authorization Type  6/10 starting 11/21/2018    PT Start Time  1523    PT Stop Time  1602    PT Time Calculation (min)  39 min    Equipment Utilized During Treatment  Gait belt    Activity Tolerance  Patient tolerated treatment well    Behavior During Therapy  [facility] for tasks assessed/performed       Past Medical History:  Diagnosis Date  . COPD (chronic obstructive pulmonary disease) ([place])   . Depression 1974  . Diabetes mellitus without complication ([place])   . Hypertension   . Migraine   . 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 LUMBAR PERITONEAL SHUNT    . TYMPANOSTOMY TUBE PLACEMENT      There were no vitals filed for this visit.  Subjective Assessment - 02/26/19 1525    Subjective  Patient had a lymphedema treatment prior to this session. Attempted to bend over and loosen shoelaces and had severe pain in her back this morning. Feels her back more today.    Pertinent History  Pain began in 2007 and has gradually got worse and has progressed to nonstop. Patient states pain last all day long, everyday with no change in symptoms. Medication helps briefly. States she has difficulty with showering and standing. States she has to stand with her knee bent due to pulling sensation in legs. Pain begins immediately when stands and begins to walk. Pain feels better when she bends her knees when standing. Pain became constant and non stop around Dec 2018. Denies bowel or bladder  changes, unexplained weakness, or unexpected weight loss/gain. She currently goes to pulmonary rehab 3x a week, but has increased pain with treadmill walking. Will walk with cane on occasion around house especially at night due to occasional dizziness and vision impairments in medical history. States she also has difficulty with carrying groceries up steps due to low back pain. She would like to be able to walk better and have less pain to be less reliant on medication.     Limitations  Lifting;Standing;Walking;House hold activities    How long can you stand comfortably?  pain begins immediately     How long can you walk comfortably?  pain begins immediately     Patient Stated Goals  walk better, have less pain    Currently in Pain?  Yes    Pain Score  5     Pain Location  Chest    Pain Orientation  Right    Pain Descriptors / Indicators  Aching    Pain Type  Acute pain    Pain Onset  1 to 4 weeks ago    Pain Frequency  Intermittent    Pain Score  6    Pain Location  Back    Pain Orientation  Left;Lower    Pain Descriptors / Indicators  Aching    Pain Type  Chronic pain;Acute pain  Pain Onset  More than a month ago    Pain Frequency  Intermittent       Manual Therapy  L/R single knee to chest stretch x 30s each LE; Hamstring stretch in supine with increasing range 60 seconds each LE Roller to lumbar and thoracic paraspinals x 3 minutes.   SAD with belt LLE 5x 20 seconds inferior direction LAD with belt LLE 5x20 seconds inferior direction Calf rollout with "the stick" 2 minutes each calf Hamstring rollout with " the stick" 2 minutes each calf    Ther-ex: cueing for sequencing, positioning and muscle activation Swiss ball forward trunk lean for spinal opening 5x forward, 5x to each angle with 10 second holds.  Seated on swiss ball with TrA activations 2 minutes Seated on swiss ball with UE raises 60 seconds for pertubations with TrA contraction Seated on swiss ball with heel lifts  10x each LE for increased pertubation to TrA contraction   Hooklying HS stretch with DF/PF for sciatic nerve mobility x20 bilateral; Posterior pelvic tilts x15. tactile and verbal cue for appropriate technique 5 seconds with hold, improved alignment     Ambulate length of gym with focus on heel strike of LLE, 4x with increasing gait mechanics with repetition, additional TrA contraction with ambulation performed with improved gait mechanics and decreased discomfort with ambulation  Stair negotiation x3 trials. First trial patient demonstrated her normal pattern with ascending: reciprocating BUE support narrow BOS and descending step to leading with LLE BUE support and scissoring pattern. PT demonstrated widening BOS for improved stability and decreased tension on back with patient performing ascending/descending twice with second time being able to descend with reciprocating pattern .    Pt educated throughout session about proper posture and technique with exercises. Improved exercise technique, movement at target joints, use of target muscles after min to mod verbal, visual, tactile cues  Patient presents with increased mobility due to being able move R arm due to lymphedema treatment prior to this session allowing increased movement with interventions. Patient reports improved pain relief by end of session with increased pain free mobility. Stair negotiation and ambulatory mechanics analyzed with patient educated on corrective techniques for stability and decreasing strain on back. Patient will continue to benefit from skilled physical therapy to improve strength, to reduce pain levels with ADLs, and to improve quality of life.                      PT Education - 02/26/19 1508    Education Details  exercise technique, manual, heel strike with mobility    Person(s) Educated  Patient    Methods  Explanation;Demonstration;Tactile cues;Verbal cues    Comprehension  Verbalized  understanding;Returned demonstration;Verbal cues required;Tactile cues required       PT Short Term Goals - 02/05/19 1726      PT SHORT TERM GOAL #1   Title  Patient will be independent with completion of HEP to improve ability to complete functional tasks and functional ADLs.    Baseline  10/23: will give HEP next session 11/19: HEP compliance    Time  2    Period  Weeks    Status  Achieved      PT SHORT TERM GOAL #2   Title  Patient will report pain score less than 4/10 on VAS to demonstrate reduction of pain levels with functional tasks.     Baseline  10/23: 6/10 11/19: 5/10 12/12: 9/10 1/10: 7/10  2/11: 3/10  Time  2    Period  Weeks    Status  Achieved        PT Long Term Goals - 02/05/19 1726      PT LONG TERM GOAL #1   Title  Patient will report pain score of <3/10 on VAS for decreased pain levels and ease with functional activities.    Baseline  10/23: 6/10 11/19: 5/10 12/12 9/10 1/10: 7/10 2/11: 3/10 3/9: 6/10  5/26: 8/10     Time  4    Period  Weeks    Status  On-going    Target Date  03/04/19      PT LONG TERM GOAL #2   Title  Patient will be able to ambulate on treadmill for 7mnutes without increase in back/leg pain demonstrating improved community ambulation and treadmill walking at pulmonary rehab    Baseline  10/23: 05mutes increases pain 11/19: 23m29mtes 12/12: has not had the chance to perform 1/10: was able to do 5 minutes without pain 2/11: able to do 5 minutes then had to bend over 3/9: 5 minutes 30 seconds at 2.0 mphs 5/26: had to walk 10 minutes at cancer center with extreme pain but was able to perform     Time  4    Period  Weeks    Status  Partially Met    Target Date  03/04/19      PT LONG TERM GOAL #3   Title  Patient will have decreased modified oswestry score <50% for improved ability to complete household tasks with less pain levels.     Baseline  10/23: 62% 11/19: 42%      Time  4    Period  Weeks    Status  Achieved      PT LONG  TERM GOAL #4   Title  Patient will score 4+/5 on BLE MMT to demonstrate improved glute strength and ability to carry groceries with increased ease up/down stairs at home.    Baseline  10/23: 4-/5 grossly 11/19: 4/5 grossly; hamstrings 4-/5 12/12: 4/5 gross 1/10: 4/5 gross 2/11: gross 4+/5 with hamstrings and abuductors 4-/5 3/9: gross 4+/5 hamstrings and abductors 4-/5 5/27: grossly 3+/5 with pain with LLE movements    Time  4    Period  Weeks    Status  On-going    Target Date  03/04/19      PT LONG TERM GOAL #5   Title  Patient will have decreased modified oswestry score <35% for improved ability to complete household tasks with less pain levels.     Baseline  11/19: 42% 12/12: 52% 1/10: 48%  2/11: 30%     Time  4    Period  Weeks    Status  Achieved      PT LONG TERM GOAL #6   Title  Patient will be able to make and/or strip the bed without pain and without needing rest breaks to perform iADLs independently.     Baseline  2/11: pain limits ability to perform, ~4 breaks required 3/9: takes her time, 3 breaks 5/27: very painful still    Time  4    Period  Weeks    Status  Partially Met    Target Date  03/04/19      PT LONG TERM GOAL #7   Title  Patient will have decreased modified oswestry score <20% for improved ability to complete household tasks with less pain levels.     Baseline  2/11: 30%  3/9: 30% 5/27: 63%    Time  4    Period  Weeks    Status  On-going    Target Date  03/04/19            Plan - 02/26/19 1613    Clinical Impression Statement  Patient presents with increased mobility due to being able move R arm due to lymphedema treatment prior to this session allowing increased movement with interventions. Patient reports improved pain relief by end of session with increased pain free mobility. Stair negotiation and ambulatory mechanics analyzed with patient educated on corrective techniques for stability and decreasing strain on back. Patient will continue to benefit  from skilled physical therapy to improve strength, to reduce pain levels with ADLs, and to improve quality of life.    Rehab Potential  Fair    Clinical Impairments Affecting Rehab Potential  (+) motivation (-) chronicity of symptoms    PT Frequency  2x / week    PT Duration  4 weeks    PT Treatment/Interventions  ADLs/Self Care Home Management;Cryotherapy;Moist Heat;Traction;Iontophoresis 70m/ml Dexamethasone;Ultrasound;Gait training;Stair training;Functional mobility training;Neuromuscular re-education;Balance training;Therapeutic exercise;Therapeutic activities;Patient/family education;Manual techniques;Energy conservation;Aquatic Therapy;Electrical Stimulation;Passive range of motion;Dry needling;Taping    PT Next Visit Plan  cross body coordination, progress core/postural stability, progress BLE strengthening    PT Home Exercise Plan  see instructions section from previous notes    Consulted and Agree with Plan of Care  Patient       Patient will benefit from skilled therapeutic intervention in order to improve the following deficits and impairments:  Abnormal gait, Cardiopulmonary status limiting activity, Decreased activity tolerance, Decreased balance, Decreased endurance, Decreased range of motion, Difficulty walking, Decreased strength, Hypomobility, Decreased mobility, Impaired perceived functional ability, Impaired flexibility, Postural dysfunction, Obesity, Improper body mechanics, Pain  Visit Diagnosis: 1. Chronic low back pain, unspecified back pain laterality, unspecified whether sciatica present   2. Abnormal posture   3. Muscle weakness (generalized)        Problem List Patient Active Problem List   Diagnosis Date Noted  . Long term current use of opiate analgesic 12/02/2018  . Tympanic membrane central perforation, left 11/04/2018  . DDD (degenerative disc disease), lumbosacral 10/10/2018  . Abnormal MRI, lumbar spine (08/29/2018) 10/10/2018  . 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 Lumbar nerve root impingement (Bilateral: L4 & L5) (L>R: L5) 10/10/2018  . Lumbar Spinal instability of L4-5 10/10/2018  . Lumbar facet hypertrophy 10/10/2018  . COPD (chronic obstructive pulmonary disease) with emphysema ([place] 08/28/2018  . Elevated rheumatoid factor 08/13/2018  . Elevated C-reactive protein (CRP) 08/12/2018  . Elevated sed rate 08/12/2018  . Lumbar facet syndrome (Bilateral) 08/12/2018  . Back pain with left-sided radiculopathy 08/12/2018  . DDD (degenerative disc disease), lumbar 08/12/2018  . Chronic lower extremity pain (Primary Area of Pain) (Bilateral) (L>R) 07/23/2018  . Chronic low back pain (Secondary Area of Pain) (Bilateral) (L>R) w/ sciatica (Bilateral) 07/23/2018  . Chronic pain syndrome 07/23/2018  . Pharmacologic therapy 07/23/2018  . Disorder of skeletal system 07/23/2018  . Problems influencing health status 07/23/2018  . Chronic sacroiliac joint pain ([facility] (Bilateral) (L>R) 07/23/2018  . Status post lumboperitoneal shunt placement 02/26/2018  . 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 Tympanic membrane perforation 05/30/2016  . PMB (postmenopausal bleeding) 04/26/2016  . Rash 12/27/2015  . 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 Plantar fasciitis of foot (Left) 05/26/2015  .  Spondylosis of lumbar region without myelopathy or radiculopathy 05/26/2015  . Lumbar foraminal stenosis (L3-4, L4-5, L5-S1) (L>R) 05/26/2015  . Spinal stenosis of lumbar region with neurogenic claudication 05/26/2015  . Encounter for screening mammogram for malignant neoplasm of breast 12/02/2014  . Goiter 12/02/2014  . Chronic idiopathic gout of foot (Right) 07/06/2014  . Biceps tendonitis (Right) 05/19/2014  . Shoulder pain (Right) 05/19/2014  . Facial weakness 05/05/2014  . Post herpetic neuralgia 05/05/2014  . Chronic  dysfunction of left eustachian tube 03/24/2014  . Osteopenia 03/13/2014  . Lumbar Grade 1 Anterolisthesis of L3/4 (16m) & L4/5 (4-139m(w/ Dynamic instability) 03/13/2014  . Family history of colon cancer 12/02/2013  . Dyshidrotic eczema 11/17/2013  . Diastasis recti 04/08/2013  . Facial nerve paresis 07/31/2012  . Blepharospasm 05/08/2012  . 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 Obesity 03/28/2012  . Hemoptysis 12/22/2011  . Neuropathic postherpetic trigeminal neuralgia 12/12/2011  . Allergic rhinitis, mild 10/12/2011  . Disequilibrium syndrome 09/27/2011  . Chronic bronchitis ([place]09/24/2012  . Essential hypertension 05/24/2011  . Type 2 diabetes mellitus without complications (HCEcho0985/49/6565. Pulmonary nodules 05/05/2011   MaJanna ArchPT, DPT   02/26/2019, 4:14 PM  [facility]irardAIN [facility] LPERVICES [address]hone: [phone] Fax:  [phone]: LiEllsie VioletteRN: [id number] of Birth: 02/16/05/1952

## 2019-02-27 ENCOUNTER — Encounter: Payer: Self-pay | Admitting: Pain Medicine

## 2019-02-27 DIAGNOSIS — M810 Age-related osteoporosis without current pathological fracture: Secondary | ICD-10-CM | POA: Insufficient documentation

## 2019-03-03 ENCOUNTER — Other Ambulatory Visit: Payer: Self-pay | Admitting: Pain Medicine

## 2019-03-03 ENCOUNTER — Other Ambulatory Visit: Payer: Self-pay

## 2019-03-03 ENCOUNTER — Ambulatory Visit: Payer: Medicare Other | Attending: Nurse Practitioner | Admitting: Pain Medicine

## 2019-03-03 DIAGNOSIS — M5442 Lumbago with sciatica, left side: Secondary | ICD-10-CM | POA: Diagnosis not present

## 2019-03-03 DIAGNOSIS — M5136 Other intervertebral disc degeneration, lumbar region: Secondary | ICD-10-CM

## 2019-03-03 DIAGNOSIS — M4310 Spondylolisthesis, site unspecified: Secondary | ICD-10-CM

## 2019-03-03 DIAGNOSIS — M79604 Pain in right leg: Secondary | ICD-10-CM | POA: Diagnosis not present

## 2019-03-03 DIAGNOSIS — M532X6 Spinal instabilities, lumbar region: Secondary | ICD-10-CM

## 2019-03-03 DIAGNOSIS — M48062 Spinal stenosis, lumbar region with neurogenic claudication: Secondary | ICD-10-CM

## 2019-03-03 DIAGNOSIS — M5441 Lumbago with sciatica, right side: Secondary | ICD-10-CM

## 2019-03-03 DIAGNOSIS — G894 Chronic pain syndrome: Secondary | ICD-10-CM

## 2019-03-03 DIAGNOSIS — M79605 Pain in left leg: Secondary | ICD-10-CM

## 2019-03-03 DIAGNOSIS — M5416 Radiculopathy, lumbar region: Secondary | ICD-10-CM

## 2019-03-03 DIAGNOSIS — M533 Sacrococcygeal disorders, not elsewhere classified: Secondary | ICD-10-CM

## 2019-03-03 DIAGNOSIS — G8929 Other chronic pain: Secondary | ICD-10-CM

## 2019-03-03 DIAGNOSIS — M48061 Spinal stenosis, lumbar region without neurogenic claudication: Secondary | ICD-10-CM

## 2019-03-03 MED ORDER — TRAMADOL HCL 50 MG PO TABS: 50.0000 mg | ORAL_TABLET | Freq: Four times a day (QID) | ORAL | 2 refills | Status: DC | PRN

## 2019-03-03 NOTE — Progress Notes (Signed)
Pain Management Virtual Encounter Note - Virtual Visit via Telephone Telehealth (real-time audio visits between healthcare provider and patient).   Patient's Phone No. & Preferred Pharmacy:  [phone] (home); [phone] (mobile); (Preferred) [phone] [email or web address]  [place], [place] [address] Phone: [phone] Fax: [phone]    Pre-screening note:  Our staff contacted Ms. Winterhalter and offered her an "in person", "face-to-face" appointment versus a telephone encounter. She indicated preferring the telephone encounter, at this time.   Reason for Virtual Visit: COVID-19*  Social distancing based on CDC and AMA recommendations.   I contacted Timoteo Ace on 03/03/2019 via telephone.      I clearly identified myself as Gaspar Cola, MD. I verified that I was speaking with the correct person using two identifiers (Name: Malai Lady, and date of birth: 1951-06-26).  Advanced Informed Consent I sought verbal advanced consent from Timoteo Ace for virtual visit interactions. I informed Ms. Ortez of possible security and privacy concerns, risks, and limitations associated with providing "not-in-person" medical evaluation and management services. I also informed Ms. Mcdaniel of the availability of "in-person" appointments. Finally, I informed her that there would be a charge for the virtual visit and that she could be  personally, fully or partially, financially responsible for it. Ms. Dermody expressed understanding and agreed to proceed.   Historic Elements   Ms. Abbee Cremeens is a 68 y.o. year old, female patient evaluated today after her last encounter by our practice on 12/02/2018. Ms. Rullo  has a past medical history of COPD (chronic obstructive pulmonary disease) ([place]), Depression (1974), Diabetes mellitus without complication ([place]), Hypertension, Migraine,  and Ramsay Hunt auricular syndrome. She also  has a past surgical history that includes Tympanostomy tube placement and Lumbar peritoneal shunt. Ms. Gathright has a current medication list which includes the following prescription(s): allopurinol, amitriptyline, b complex vitamins, calcium carbonate-vitamin d, furosemide, gabapentin, letrozole, magnesium oxide, metformin, multivitamin, mupirocin ointment, tramadol, umeclidinium bromide, and verapamil. She  reports that she quit smoking about 27 years ago. Her smoking use included cigarettes. She has a 81.00 pack-year smoking history. She has never used smokeless tobacco. She reports that she does not drink alcohol or use drugs. Ms. Szymanowski is allergic to penicillins; sulfa antibiotics; and bee venom.   HPI  Today, she is being contacted for medication management.  Pharmacotherapy Assessment  Analgesic: Tramadol 50 mg 1 tablet4 times daily MME/day:20mg /day  Monitoring: Pharmacotherapy: No side-effects or adverse reactions reported. [place] PMP: PDMP reviewed during this encounter.       Compliance: No problems identified. Effectiveness: Clinically acceptable. Plan: Refer to "POC".  Pertinent Labs   SAFETY SCREENING Profile No results found for: SARSCOV2NAA, COVIDSOURCE, STAPHAUREUS, MRSAPCR, HCVAB, HIV, PREGTESTUR Renal Function Lab Results  Component Value Date   BUN 21 07/23/2018   CREATININE 1.05 (H) 07/23/2018   BCR 20 07/23/2018   GFRAA 64 07/23/2018   GFRNONAA 55 (L) 07/23/2018   Hepatic Function Lab Results  Component Value Date   AST 17 07/23/2018   ALBUMIN 4.7 07/23/2018   UDS Summary  Date Value Ref Range Status  12/02/2018 FINAL  Final    Comment:    ==================================================================== TOXASSURE SELECT 13 (MW) ==================================================================== Test                             Result       Flag  Units Drug Present   Tramadol                        >8621                   ng/mg creat   O-Desmethyltramadol            7705                    ng/mg creat   N-Desmethyltramadol            1902                    ng/mg creat    Source of tramadol is a prescription medication.    O-desmethyltramadol and N-desmethyltramadol are expected    metabolites of tramadol. ==================================================================== Test                      Result    Flag   Units      Ref Range   Creatinine              58               mg/dL      >=20 ==================================================================== Declared Medications:  Medication list was not provided. ==================================================================== For clinical consultation, please call [phone]. ====================================================================    Note: Above Lab results reviewed.  Recent imaging  DG C-Arm 1-60 Min-No Report Fluoroscopy was utilized by the requesting physician.  No radiographic  interpretation.   Assessment  The primary encounter diagnosis was Chronic pain syndrome. Diagnoses of Chronic lower extremity pain (Primary Area of Pain) (Bilateral) (L>R), Chronic low back pain (Secondary Area of Pain) (Bilateral) (L>R) w/ sciatica (Bilateral), Chronic sacroiliac joint pain (Tertiary Area of Pain) (Bilateral) (L>R), DDD (degenerative disc disease), lumbar, 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, L4-5), Lumbar foraminal stenosis (L3-4, L4-5, L5-S1) (L>R), Lumbar Grade 1 Anterolisthesis of L3/4 (15mm) & L4/5 (4-58mm)(w/ Dynamic instability), Lumbar nerve root impingement (Bilateral: L4 & L5) (L>R: L5), and Lumbar Spinal instability of L4-5 were also pertinent to this visit.  Plan of Care  I am having Sonia Side. Tilley maintain her allopurinol, amitriptyline, b complex vitamins, Calcium Carbonate-Vitamin D, furosemide, gabapentin, magnesium oxide, metFORMIN, multivitamin, umeclidinium  bromide, verapamil, mupirocin ointment, letrozole, and traMADol.  Pharmacotherapy (Medications Ordered): Meds ordered this encounter  Medications  . traMADol (ULTRAM) 50 MG tablet    Sig: Take 1 tablet (50 mg total) by mouth every 6 (six) hours as needed for severe pain.    Dispense:  120 tablet    Refill:  2    Chronic Pain: STOP Act - Not applicable. Fill 1 day early if closed on scheduled refill date. Do not fill until: 03/18/2019. To last until: 06/16/2019. Instruct to avoid benzodiazepines within 8 hours of opioid.   Orders:  Orders Placed This Encounter  Procedures  . Lumbar Epidural Injection    Standing Status:   Standing    Number of Occurrences:   9    Standing Expiration Date:   09/01/2020    Scheduling Instructions:     Purpose: Therapeutic     Indication: Lower extremity pain/Sciatica left (M54.32).     Side: Left-sided     Level: L3-4     Sedation: Patient's choice.     TIMEFRAME: PRN procedure. (Ms. Moors will call when needed.)    Order Specific Question:   Where will  this procedure be performed?    Answer:   [facility]  . Lumbar Transforaminal Epidural    Standing Status:   Standing    Number of Occurrences:   1    Standing Expiration Date:   03/02/2020    Scheduling Instructions:     Purpose: Therapeutic     Indication: Lower extremity pain. Radiculopathy/Radiculitis lumbar (M54.16).     Side: Bilateral     Level: L4     Sedation: Patient's choice.     TIMEFRAME: PRN procedure. (Ms. Pickerill will call when needed.)    Order Specific Question:   Where will this procedure be performed?    Answer:   [facility]   Follow-up plan:   Return in about 15 weeks (around 06/16/2019) for (VV), E/M (MM), in addition, PRN Procedure: (B) L4 TFESI #2 + (L) L3-4 LESI #2.    Recent Visits No visits were found meeting these conditions.  Showing recent visits within past 90 days and meeting all other requirements   Today's Visits Date Type Provider Dept   03/03/19 Office Visit Milinda Pointer, MD [facility]  Showing today's visits and meeting all other requirements   Future Appointments No visits were found meeting these conditions.  Showing future appointments within next 90 days and meeting all other requirements   I discussed the assessment and treatment plan with the patient. The patient was provided an opportunity to ask questions and all were answered. The patient agreed with the plan and demonstrated an understanding of the instructions.  Patient advised to call back or seek an in-person evaluation if the symptoms or condition worsens.  Total duration of non-face-to-face encounter: 12 minutes.  Note by: Gaspar Cola, MD Date: 03/03/2019; Time: 10:39 AM  Note: This dictation was prepared with Dragon dictation. Any transcriptional errors that may result from this process are unintentional.  Disclaimer:  * Given the special circumstances of the COVID-19 pandemic, the federal government has announced that the Office for Civil Rights (OCR) will exercise its enforcement discretion and will not impose penalties on physicians using telehealth in the event of noncompliance with regulatory requirements under the [place] (HIPAA) in connection with the good faith provision of telehealth during the IHWTU-88 national public health emergency. ([place])

## 2019-03-03 NOTE — Patient Instructions (Signed)
____________________________________________________________________________________________  Medication Rules  Purpose: To inform patients, and their family members, of our rules and regulations.  Applies to: All patients receiving prescriptions (written or electronic).  Pharmacy of record: Pharmacy where electronic prescriptions will be sent. If written prescriptions are taken to a different pharmacy, please inform the nursing staff. The pharmacy listed in the electronic medical record should be the one where you would like electronic prescriptions to be sent.  Electronic prescriptions: In compliance with the [place] Strengthen Opioid Misuse Prevention (STOP) Act of 2017 (Session Law 2017-74/H243), effective September 11, 2018, all controlled substances must be electronically prescribed. Calling prescriptions to the pharmacy will cease to exist.  Prescription refills: Only during scheduled appointments. Applies to all prescriptions.  NOTE: The following applies primarily to controlled substances (Opioid* Pain Medications).   Patient's responsibilities: 1. Pain Pills: Bring all pain pills to every appointment (except for procedure appointments). 2. Pill Bottles: Bring pills in original pharmacy bottle. Always bring the newest bottle. Bring bottle, even if empty. 3. Medication refills: You are responsible for knowing and keeping track of what medications you take and those you need refilled. The day before your appointment: write a list of all prescriptions that need to be refilled. The day of the appointment: give the list to the admitting nurse. Prescriptions will be written only during appointments. No prescriptions will be written on procedure days. If you forget a medication: it will not be "Called in", "Faxed", or "electronically sent". You will need to get another appointment to get these prescribed. No early refills. Do not call asking to have your prescription filled  early. 4. Prescription Accuracy: You are responsible for carefully inspecting your prescriptions before leaving our office. Have the discharge nurse carefully go over each prescription with you, before taking them home. Make sure that your name is accurately spelled, that your address is correct. Check the name and dose of your medication to make sure it is accurate. Check the number of pills, and the written instructions to make sure they are clear and accurate. Make sure that you are given enough medication to last until your next medication refill appointment. 5. Taking Medication: Take medication as prescribed. When it comes to controlled substances, taking less pills or less frequently than prescribed is permitted and encouraged. Never take more pills than instructed. Never take medication more frequently than prescribed.  6. Inform other Doctors: Always inform, all of your healthcare providers, of all the medications you take. 7. Pain Medication from other Providers: You are not allowed to accept any additional pain medication from any other Doctor or Healthcare provider. There are two exceptions to this rule. (see below) In the event that you require additional pain medication, you are responsible for notifying us, as stated below. 8. Medication Agreement: You are responsible for carefully reading and following our Medication Agreement. This must be signed before receiving any prescriptions from our practice. Safely store a copy of your signed Agreement. Violations to the Agreement will result in no further prescriptions. (Additional copies of our Medication Agreement are available upon request.) 9. Laws, Rules, & Regulations: All patients are expected to follow all Federal and State Laws, Statutes, Rules, & Regulations. Ignorance of the Laws does not constitute a valid excuse. The use of any illegal substances is prohibited. 10. Adopted CDC guidelines & recommendations: Target dosing levels will be  at or below 60 MME/day. Use of benzodiazepines** is not recommended.  Exceptions: There are only two exceptions to the rule of not   receiving pain medications from other Healthcare Providers. 1. Exception #1 (Emergencies): In the event of an emergency (i.e.: accident requiring emergency care), you are allowed to receive additional pain medication. However, you are responsible for: As soon as you are able, call our office [phone], at any time of the day or night, and leave a message stating your name, the date and nature of the emergency, and the name and dose of the medication prescribed. In the event that your call is answered by a member of our staff, make sure to document and save the date, time, and the name of the person that took your information.  2. Exception #2 (Planned Surgery): In the event that you are scheduled by another doctor or dentist to have any type of surgery or procedure, you are allowed (for a period no longer than 30 days), to receive additional pain medication, for the acute post-op pain. However, in this case, you are responsible for picking up a copy of our "Post-op Pain Management for Surgeons" handout, and giving it to your surgeon or dentist. This document is available at our office, and does not require an appointment to obtain it. Simply go to our office during business hours (Monday-Thursday from 8:00 AM to 4:00 PM) (Friday 8:00 AM to 12:00 Noon) or if you have a scheduled appointment with us, prior to your surgery, and ask for it by name. In addition, you will need to provide us with your name, name of your surgeon, type of surgery, and date of procedure or surgery.  *Opioid medications include: morphine, codeine, oxycodone, oxymorphone, hydrocodone, hydromorphone, meperidine, tramadol, tapentadol, buprenorphine, fentanyl, methadone. **Benzodiazepine medications include: diazepam (Valium), alprazolam (Xanax), clonazepam (Klonopine), lorazepam (Ativan), clorazepate  (Tranxene), chlordiazepoxide (Librium), estazolam (Prosom), oxazepam (Serax), temazepam (Restoril), triazolam (Halcion) (Last updated: 11/08/2017) ____________________________________________________________________________________________   ____________________________________________________________________________________________  Medication Recommendations and Reminders  Applies to: All patients receiving prescriptions (written and/or electronic).  Medication Rules & Regulations: These rules and regulations exist for your safety and that of others. They are not flexible and neither are we. Dismissing or ignoring them will be considered "non-compliance" with medication therapy, resulting in complete and irreversible termination of such therapy. (See document titled "Medication Rules" for more details.) In all conscience, because of safety reasons, we cannot continue providing a therapy where the patient does not follow instructions.  Pharmacy of record:   Definition: This is the pharmacy where your electronic prescriptions will be sent.   We do not endorse any particular pharmacy.  You are not restricted in your choice of pharmacy.  The pharmacy listed in the electronic medical record should be the one where you want electronic prescriptions to be sent.  If you choose to change pharmacy, simply notify our nursing staff of your choice of new pharmacy.  Recommendations:  Keep all of your pain medications in a safe place, under lock and key, even if you live alone.   After you fill your prescription, take 1 week's worth of pills and put them away in a safe place. You should keep a separate, properly labeled bottle for this purpose. The remainder should be kept in the original bottle. Use this as your primary supply, until it runs out. Once it's gone, then you know that you have 1 week's worth of medicine, and it is time to come in for a prescription refill. If you do this correctly, it  is unlikely that you will ever run out of medicine.  To make sure that the above recommendation works,   it is very important that you make sure your medication refill appointments are scheduled at least 1 week before you run out of medicine. To do this in an effective manner, make sure that you do not leave the office without scheduling your next medication management appointment. Always ask the nursing staff to show you in your prescription , when your medication will be running out. Then arrange for the receptionist to get you a return appointment, at least 7 days before you run out of medicine. Do not wait until you have 1 or 2 pills left, to come in. This is very poor planning and does not take into consideration that we may need to cancel appointments due to bad weather, sickness, or emergencies affecting our staff.  "Partial Fill": If for any reason your pharmacy does not have enough pills/tablets to completely fill or refill your prescription, do not allow for a "partial fill". You will need a separate prescription to fill the remaining amount, which we will not provide. If the reason for the partial fill is your insurance, you will need to talk to the pharmacist about payment alternatives for the remaining tablets, but again, do not accept a partial fill.  Prescription refills and/or changes in medication(s):   Prescription refills, and/or changes in dose or medication, will be conducted only during scheduled medication management appointments. (Applies to both, written and electronic prescriptions.)  No refills on procedure days. No medication will be changed or started on procedure days. No changes, adjustments, and/or refills will be conducted on a procedure day. Doing so will interfere with the diagnostic portion of the procedure.  No phone refills. No medications will be "called into the pharmacy".  No Fax refills.  No weekend refills.  No Holliday refills.  No after hours  refills.  Remember:  Business hours are:  Monday to Thursday 8:00 AM to 4:00 PM Provider's Schedule: Crystal King, NP - Appointments are:  Medication management: Monday to Thursday 8:00 AM to 4:00 PM Korena Nass, MD - Appointments are:  Medication management: Monday and Wednesday 8:00 AM to 4:00 PM Procedure day: Tuesday and Thursday 7:30 AM to 4:00 PM Bilal Lateef, MD - Appointments are:  Medication management: Tuesday and Thursday 8:00 AM to 4:00 PM Procedure day: Monday and Wednesday 7:30 AM to 4:00 PM (Last update: 11/08/2017) ____________________________________________________________________________________________   ____________________________________________________________________________________________  CANNABIDIOL (AKA: CBD Oil or Pills)  Applies to: All patients receiving prescriptions of controlled substances (written and/or electronic).  General Information: Cannabidiol (CBD) was discovered in 1940. It is one of some 113 identified cannabinoids in cannabis (Marijuana) plants, accounting for up to 40% of the plant's extract. As of 2018, preliminary clinical research on cannabidiol included studies of anxiety, cognition, movement disorders, and pain.  Cannabidiol is consummed in multiple ways, including inhalation of cannabis smoke or vapor, as an aerosol spray into the cheek, and by mouth. It may be supplied as CBD oil containing CBD as the active ingredient (no added tetrahydrocannabinol (THC) or terpenes), a full-plant CBD-dominant hemp extract oil, capsules, dried cannabis, or as a liquid solution. CBD is thought not have the same psychoactivity as THC, and may affect the actions of THC. Studies suggest that CBD may interact with different biological targets, including cannabinoid receptors and other neurotransmitter receptors. As of 2018 the mechanism of action for its biological effects has not been determined.  In the [place], cannabidiol has a limited  approval by the Food and Drug Administration (FDA) for treatment of only two types   of epilepsy disorders. The side effects of long-term use of the drug include somnolence, decreased appetite, diarrhea, fatigue, malaise, weakness, sleeping problems, and others.  CBD remains a Schedule I drug prohibited for any use.  Legality: Some manufacturers ship CBD products nationally, an illegal action which the FDA has not enforced in 2018, with CBD remaining the subject of an FDA investigational new drug evaluation, and is not considered legal as a dietary supplement or food ingredient as of December 2018. Federal illegality has made it difficult historically to conduct research on CBD. CBD is openly sold in head shops and health food stores in some states where such sales have not been explicitly legalized.  Warning: Because it is not FDA approved for general use or treatment of pain, it is not required to undergo the same manufacturing controls as prescription drugs.  This means that the available cannabidiol (CBD) may be contaminated with THC.  If this is the case, it will trigger a positive urine drug screen (UDS) test for cannabinoids (Marijuana).  Because a positive UDS for illicit substances is a violation of our medication agreement, your opioid analgesics (pain medicine) may be permanently discontinued. (Last update: 11/29/2017) ____________________________________________________________________________________________    

## 2019-03-04 ENCOUNTER — Encounter: Payer: Medicare Other | Admitting: Nurse Practitioner

## 2019-03-05 ENCOUNTER — Ambulatory Visit: Payer: Medicare Other | Admitting: Occupational Therapy

## 2019-03-05 ENCOUNTER — Other Ambulatory Visit: Payer: Self-pay

## 2019-03-05 ENCOUNTER — Ambulatory Visit: Payer: Medicare Other

## 2019-03-05 DIAGNOSIS — R2689 Other abnormalities of gait and mobility: Secondary | ICD-10-CM

## 2019-03-05 DIAGNOSIS — R293 Abnormal posture: Secondary | ICD-10-CM

## 2019-03-05 DIAGNOSIS — G8929 Other chronic pain: Secondary | ICD-10-CM

## 2019-03-05 DIAGNOSIS — M545 Low back pain, unspecified: Secondary | ICD-10-CM

## 2019-03-05 DIAGNOSIS — M6281 Muscle weakness (generalized): Secondary | ICD-10-CM

## 2019-03-05 DIAGNOSIS — I972 Postmastectomy lymphedema syndrome: Secondary | ICD-10-CM

## 2019-03-05 NOTE — Therapy (Signed)
[place] MAIN [facility] SERVICES [address] [place], [place], [address] Phone: [phone]   Fax:  [phone]  Occupational Therapy Treatment  Patient Details  Name: Susan Callahan MRN: [id number] Date of Birth: 26-Oct-1950 Referring Provider (OT): Humberto Leep, MD   Encounter Date: 03/05/2019  OT End of Session - 03/05/19 1733    Visit Number  3    Number of Visits  36    Date for OT Re-Evaluation  05/20/19    OT Start Time  0220    OT Stop Time  0320    OT Time Calculation (min)  60 min    Activity Tolerance  Patient tolerated treatment well;No increased pain    Behavior During Therapy  WFL for tasks assessed/performed       Past Medical History:  Diagnosis Date  . COPD (chronic obstructive pulmonary disease) ([place])   . Depression 1974  . Diabetes mellitus without complication ([place])   . Hypertension   . Migraine   . 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 LUMBAR PERITONEAL SHUNT    . TYMPANOSTOMY TUBE PLACEMENT      There were no vitals filed for this visit.  Subjective Assessment - 03/05/19 1734    Subjective   Pt presents for OT visit 3/ 36 to address RUE/ RUQ post-mastectomy swelling and axillary web syndrome. Pt reports pain in arm and R axilla kept her awake and finally caused her to be tearful  much of the night lat night. Axilla is red and warm . Pt reports it has been 48 hours since she started amoxicillan perscription.    Pain Onset  1 to 4 weeks ago    Pain Onset  More than a month ago                   OT Treatments/Exercises (OP) - 03/05/19 0001      ADLs   ADL Education Given  Yes      Manual Therapy   Manual Therapy  Edema management;Soft tissue mobilization;Manual Traction    Manual therapy comments  myofacial release at R      volar arm and forearm in effort to release axillary cording    Soft tissue mobilization  palpable cording-mild              OT Education - 03/05/19 1738    Education Details  Pt edu re seroma formation and infection risk. Cont edu re axillary anatomy and cording release    Person(s) Educated  Patient    Methods  Demonstration;Explanation;Tactile cues;Verbal cues;Handout    Comprehension  Verbalized understanding;Returned demonstration          OT Long Term Goals - 02/20/19 1650      OT LONG TERM GOAL #1   Title  Pt independent with lymphedema precautions and prevention strategies to limit LE progression.    Baseline  Max A    Time  4    Period  Days    Status  New    Target Date  --   4th OT Rx visit     OT LONG TERM GOAL #2   Title  Pt will be independent with lymphedema self-care home program and AWS ther ex to improve functional performance of basic and instrumental ADLs by DC.    Baseline  Max A    Time  12    Period  Weeks  Status  New    Target Date  05/20/19   4th OT Rx visit     OT LONG TERM GOAL #3   Title  Pt wil report 0/10 pain with RUE functional use ( reaching, lifting, carrying, dressing, dressing, etc) to achieve independent , pain free ADLs performance.    Baseline  Max A    Time  12    Period  Weeks    Status  New    Target Date  05/20/19            Plan - 03/05/19 1739    Clinical Impression Statement  Seroma at R axillary surgical site appears to be infected with signs and symptoms including redness, pain, swelling , and increased skin temperature. Pt strongly encouraged to call surgeon and report worsening symptoms since it has been 48 hours since she started perscribed antibiotic. Consequently avoided axilla   today and performed cord release techniques at R volar forearm at antecubital fossa and forarm with shoudler abducted to 90 degrees, elbow and wrost ful;ly extended and supported on a pillow in supine. Five cords released with improved pain free shoulder AROM from ~ 90 degrees flexion and abduction to 120 degrees for each plane. Continue to  closely monitor skin condition and infection status.    OT Occupational Profile and History  Comprehensive Assessment- Review of records and extensive additional review of physical, cognitive, psychosocial history related to current functional performance    Occupational performance deficits (Please refer to evaluation for details):  ADL's;IADL's;Social Participation;Leisure;Rest and Sleep;Work    Marketing executive / Function / Physical Skills  ADL;Decreased knowledge of precautions;ROM;UE functional use;Decreased knowledge of use of DME;Edema;Skin integrity;Pain;IADL    Rehab Potential  Good    Clinical Decision Making  Several treatment options, min-mod task modification necessary    Comorbidities Affecting Occupational Performance:  Presence of comorbidities impacting occupational performance    Comorbidities impacting occupational performance description:  see SUBJUECTIVE    Modification or Assistance to Complete Evaluation   Min-Moderate modification of tasks or assist with assess necessary to complete eval    OT Frequency  2x / week    OT Duration  12 weeks    OT Treatment/Interventions  Self-care/ADL training;Therapeutic exercise;Manual lymph drainage;Patient/family education;Therapeutic activities;DME and/or AE instruction;Manual Therapy;Other (comment)   consider fitting with prophylactic cmopression arm sleeve   Plan  BUE AROM ther ex to reduce axillary cording- 2 sets of 10 2-3 x daily    Consulted and Agree with Plan of Care  Patient       Patient will benefit from skilled therapeutic intervention in order to improve the following deficits and impairments:   Body Structure / Function / Physical Skills: ADL, Decreased knowledge of precautions, ROM, UE functional use, Decreased knowledge of use of DME, Edema, Skin integrity, Pain, IADL       Visit Diagnosis: 1. Postmastectomy lymphedema syndrome       Problem List Patient Active Problem List   Diagnosis Date Noted  . Malignant  neoplasm of central portion of right breast in female, estrogen receptor positive ([place]) 02/12/2019  . Genetic testing 01/31/2019  . Malignant neoplasm of upper-inner quadrant of right breast in female, estrogen receptor positive ([place]) 01/15/2019  . Long term current use of opiate analgesic 12/02/2018  . Tympanic membrane central perforation, left 11/04/2018  . DDD (degenerative disc disease), lumbosacral 10/10/2018  . Abnormal MRI, lumbar spine (08/29/2018) 10/10/2018  . 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  Lumbar nerve root impingement (Bilateral: L4 & L5) (L>R: L5) 10/10/2018  . Lumbar Spinal instability of L4-5 10/10/2018  . Lumbar facet hypertrophy 10/10/2018  . COPD (chronic obstructive pulmonary disease) with emphysema ([place]) 08/28/2018  . Elevated rheumatoid factor 08/13/2018  . Elevated C-reactive protein (CRP) 08/12/2018  . Elevated sed rate 08/12/2018  . Lumbar facet syndrome (Bilateral) 08/12/2018  . Back pain with left-sided radiculopathy 08/12/2018  . DDD (degenerative disc disease), lumbar 08/12/2018  . Chronic lower extremity pain (Primary Area of Pain) (Bilateral) (L>R) 07/23/2018  . Chronic low back pain (Secondary Area of Pain) (Bilateral) (L>R) w/ sciatica (Bilateral) 07/23/2018  . Chronic pain syndrome 07/23/2018  . Pharmacologic therapy 07/23/2018  . Disorder of skeletal system 07/23/2018  . Problems influencing health status 07/23/2018  . Chronic sacroiliac joint pain [facility] Area of Pain) (Bilateral) (L>R) 07/23/2018  . Status post lumboperitoneal shunt placement 02/26/2018  . 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 Tympanic membrane perforation 05/30/2016  . PMB (postmenopausal bleeding) 04/26/2016  . Rash 12/27/2015  . 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 Plantar fasciitis of foot (Left) 05/26/2015  . Spondylosis of lumbar region without  myelopathy or radiculopathy 05/26/2015  . Lumbar foraminal stenosis (L3-4, L4-5, L5-S1) (L>R) 05/26/2015  . Spinal stenosis of lumbar region with neurogenic claudication 05/26/2015  . Encounter for screening mammogram for malignant neoplasm of breast 12/02/2014  . Goiter 12/02/2014  . Chronic idiopathic gout of foot (Right) 07/06/2014  . Biceps tendonitis (Right) 05/19/2014  . Shoulder pain (Right) 05/19/2014  . Facial weakness 05/05/2014  . Post herpetic neuralgia 05/05/2014  . Chronic dysfunction of left eustachian tube 03/24/2014  . Osteopenia 03/13/2014  . Lumbar Grade 1 Anterolisthesis of L3/4 (34mm) & L4/5 (4-57mm)(w/ Dynamic instability) 03/13/2014  . Family history of colon cancer 12/02/2013  . Dyshidrotic eczema 11/17/2013  . Diastasis recti 04/08/2013  . Facial nerve paresis 07/31/2012  . Blepharospasm 05/08/2012  . 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 Obesity 03/28/2012  . Hemoptysis 12/22/2011  . Neuropathic postherpetic trigeminal neuralgia 12/12/2011  . Allergic rhinitis, mild 10/12/2011  . Disequilibrium syndrome 09/27/2011  . Chronic bronchitis ([place]) 06/05/2011  . Essential hypertension 05/24/2011  . Type 2 diabetes mellitus without complications ([place]) 96/28/3662  . Pulmonary nodules 05/05/2011     Andrey Spearman, MS, OTR/L, [facility] 03/05/19 5:45 PM   [place] MAIN [facility] SERVICES [address] [place], [place], [address] Phone: [phone]   Fax:  [phone]  Name: Susan Callahan MRN: [id number] Date of Birth: [birth year]

## 2019-03-05 NOTE — Therapy (Signed)
[place] MAIN [facility] SERVICES [address] [place], [place], [address] Phone: [phone]   Fax:  [phone]  Patient Details  Name: Chea Malan MRN: [id number] Date of Birth: [birth year] Referring Provider:  Germain Osgood, MD  Encounter Date: 03/05/2019  Patient arrived to physical therapy visit s/p lymphedema treatment. Patient informed by OT that she may have developed cellulitis and needs to call physician immediately for care. Patient in pain, extremely fatigued, and emotional. Consulted/educated patient in need to follow OT recommendation of calling physician and being seen due to sudden change/pain. Patient agreeable and calling physician. Will see patient for next scheduled session.   Janna Arch, PT, DPT   03/05/2019, 3:32 PM  [place] MAIN [facility] SERVICES [address] [place], [place], [address] Phone: [phone]   Fax:  [phone]

## 2019-03-06 ENCOUNTER — Ambulatory Visit: Payer: Medicare Other | Admitting: Occupational Therapy

## 2019-03-10 ENCOUNTER — Other Ambulatory Visit
Admission: RE | Admit: 2019-03-10 | Discharge: 2019-03-10 | Disposition: A | Discharge status: Home / Self Care | Payer: Medicare Other | Source: Ambulatory Visit | Attending: Pain Medicine | Admitting: Pain Medicine

## 2019-03-10 ENCOUNTER — Other Ambulatory Visit: Payer: Self-pay

## 2019-03-10 ENCOUNTER — Ambulatory Visit: Payer: Medicare Other

## 2019-03-10 DIAGNOSIS — Z01812 Encounter for preprocedural laboratory examination: Secondary | ICD-10-CM | POA: Diagnosis present

## 2019-03-10 DIAGNOSIS — M6281 Muscle weakness (generalized): Secondary | ICD-10-CM

## 2019-03-10 DIAGNOSIS — G8929 Other chronic pain: Secondary | ICD-10-CM

## 2019-03-10 DIAGNOSIS — M545 Low back pain, unspecified: Secondary | ICD-10-CM

## 2019-03-10 DIAGNOSIS — Z1159 Encounter for screening for other viral diseases: Secondary | ICD-10-CM | POA: Insufficient documentation

## 2019-03-10 DIAGNOSIS — R2689 Other abnormalities of gait and mobility: Secondary | ICD-10-CM

## 2019-03-10 DIAGNOSIS — R293 Abnormal posture: Secondary | ICD-10-CM

## 2019-03-10 DIAGNOSIS — I972 Postmastectomy lymphedema syndrome: Secondary | ICD-10-CM

## 2019-03-10 NOTE — Therapy (Signed)
[place] MAIN [facility] SERVICES [address] [place], [place], [address] Phone: [phone]   Fax:  [phone]  Physical Therapy Treatment  Patient Details  Name: Susan Callahan MRN: [id number] Date of Birth: 11-20-1950 Referring Provider (PT): Francesco Sor   Encounter Date: 03/10/2019  PT End of Session - 03/10/19 1531    Visit Number  37    Number of Visits  38    Date for PT Re-Evaluation  03/05/19    Authorization Type  7/10 starting 11/21/2018    PT Start Time  1510    PT Stop Time  1550    PT Time Calculation (min)  40 min    Activity Tolerance  Patient tolerated treatment well    Behavior During Therapy  [facility] for tasks assessed/performed       Past Medical History:  Diagnosis Date  . COPD (chronic obstructive pulmonary disease) ([place])   . Depression 1974  . Diabetes mellitus without complication ([place])   . Hypertension   . Migraine   . 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 LUMBAR PERITONEAL SHUNT    . TYMPANOSTOMY TUBE PLACEMENT      There were no vitals filed for this visit.  Subjective Assessment - 03/10/19 1517    Subjective  Pt reports she is now s/p drainage of abscess, <50cc purlulent drainage from Right axilla abscess, She is on a new ABX, which her lab results support will be efficacious for cultures.    Pertinent History  Pain began in 2007 and has gradually got worse and has progressed to nonstop. Patient states pain last all day long, everyday with no change in symptoms. Medication helps briefly. States she has difficulty with showering and standing. States she has to stand with her knee bent due to pulling sensation in legs. Pain begins immediately when stands and begins to walk. Pain feels better when she bends her knees when standing. Pain became constant and non stop around Dec 2018. Denies bowel or bladder changes, unexplained weakness, or unexpected  weight loss/gain. She currently goes to pulmonary rehab 3x a week, but has increased pain with treadmill walking. Will walk with cane on occasion around house especially at night due to occasional dizziness and vision impairments in medical history. States she also has difficulty with carrying groceries up steps due to low back pain. She would like to be able to walk better and have less pain to be less reliant on medication.     Currently in Pain?  Yes    Pain Score  7     Pain Orientation  Right   right axillary abscess   Pain Descriptors / Indicators  Burning       TREATMENT THIS SESSION: -single knee to chest stretch 3x30s bilat (contralateral hip in neutral, no noted psoas tightness) -Reverse curl-up (A/ROM DKTC) hands free, multimodal cues for transverse abdominus activation prior to lift; 1x10 (reported 'sciatica' symptoms worse; noted diastasis buldge) -Repeated lumbar flexion 1x30sec in hook-lying (DKTC) author facilitated -Hook lying Transversus abdominus activation 10x5secH, multimodal cues to DC breath holding, and focus on draw in without ABD bulge -TrABD activation with single hi[/knee flexion from hooklying. 2x10 bilat (able to perform without sciatic pain) -Glute max bridge (maximal knee flexion) 2x10, well tolerated    PT Short Term Goals - 02/05/19 1726      PT SHORT TERM GOAL #1   Title  Patient will  be independent with completion of HEP to improve ability to complete functional tasks and functional ADLs.    Baseline  10/23: will give HEP next session 11/19: HEP compliance    Time  2    Period  Weeks    Status  Achieved      PT SHORT TERM GOAL #2   Title  Patient will report pain score less than 4/10 on VAS to demonstrate reduction of pain levels with functional tasks.     Baseline  10/23: 6/10 11/19: 5/10 12/12: 9/10 1/10: 7/10  2/11: 3/10     Time  2    Period  Weeks    Status  Achieved        PT Long Term Goals - 02/05/19 1726      PT LONG TERM GOAL #1    Title  Patient will report pain score of <3/10 on VAS for decreased pain levels and ease with functional activities.    Baseline  10/23: 6/10 11/19: 5/10 12/12 9/10 1/10: 7/10 2/11: 3/10 3/9: 6/10  5/26: 8/10     Time  4    Period  Weeks    Status  On-going    Target Date  03/04/19      PT LONG TERM GOAL #2   Title  Patient will be able to ambulate on treadmill for 535mnutes without increase in back/leg pain demonstrating improved community ambulation and treadmill walking at pulmonary rehab    Baseline  10/23: 044mutes increases pain 11/19: 35m65mtes 12/12: has not had the chance to perform 1/10: was able to do 5 minutes without pain 2/11: able to do 5 minutes then had to bend over 3/9: 5 minutes 30 seconds at 2.0 mphs 5/26: had to walk 10 minutes at cancer center with extreme pain but was able to perform     Time  4    Period  Weeks    Status  Partially Met    Target Date  03/04/19      PT LONG TERM GOAL #3   Title  Patient will have decreased modified oswestry score <50% for improved ability to complete household tasks with less pain levels.     Baseline  10/23: 62% 11/19: 42%      Time  4    Period  Weeks    Status  Achieved      PT LONG TERM GOAL #4   Title  Patient will score 4+/5 on BLE MMT to demonstrate improved glute strength and ability to carry groceries with increased ease up/down stairs at home.    Baseline  10/23: 4-/5 grossly 11/19: 4/5 grossly; hamstrings 4-/5 12/12: 4/5 gross 1/10: 4/5 gross 2/11: gross 4+/5 with hamstrings and abuductors 4-/5 3/9: gross 4+/5 hamstrings and abductors 4-/5 5/27: grossly 3+/5 with pain with LLE movements    Time  4    Period  Weeks    Status  On-going    Target Date  03/04/19      PT LONG TERM GOAL #5   Title  Patient will have decreased modified oswestry score <35% for improved ability to complete household tasks with less pain levels.     Baseline  11/19: 42% 12/12: 52% 1/10: 48%  2/11: 30%     Time  4    Period  Weeks    Status   Achieved      PT LONG TERM GOAL #6   Title  Patient will be able to make and/or strip the bed  without pain and without needing rest breaks to perform iADLs independently.     Baseline  2/11: pain limits ability to perform, ~4 breaks required 3/9: takes her time, 3 breaks 5/27: very painful still    Time  4    Period  Weeks    Status  Partially Met    Target Date  03/04/19      PT LONG TERM GOAL #7   Title  Patient will have decreased modified oswestry score <20% for improved ability to complete household tasks with less pain levels.     Baseline  2/11: 30%  3/9: 30% 5/27: 63%    Time  4    Period  Weeks    Status  On-going    Target Date  03/04/19            Plan - 03/10/19 1556    Clinical Impression Statement  In general, patient demonstrating overall good tolerate to therapy session this date, reasonable accommodations are made during session to allow postural accommodations to prevent worsening of pain or other symptoms. Pt demonstrates copious motivation, focused on best performance this date.  Careful attention remains on slow progression of strengthening activities as abdominal wall imbalances remain severe. After extensive education and cues, isolated activation of deep abdominals is achieved, pt noting subjective differences that are favorable to her comfort and symptoms. Pt given intermittent multimodal cues to teach best possible form with exercises. Pt reports he ability to correct posture at home during standing IADL and housework is improved with near immediate differences in her symptoms. Pt continues to make steady progress toward most goals. No home exercise updates made at this time.    Rehab Potential  Fair    Clinical Impairments Affecting Rehab Potential  (+) motivation (-) chronicity of symptoms    PT Frequency  2x / week    PT Duration  4 weeks    PT Treatment/Interventions  ADLs/Self Care Home Management;Cryotherapy;Moist Heat;Traction;Iontophoresis 62m/ml  Dexamethasone;Ultrasound;Gait training;Stair training;Functional mobility training;Neuromuscular re-education;Balance training;Therapeutic exercise;Therapeutic activities;Patient/family education;Manual techniques;Energy conservation;Aquatic Therapy;Electrical Stimulation;Passive range of motion;Dry needling;Taping    PT Next Visit Plan  cross body coordination, progress core/postural stability, progress BLE strengthening    PT Home Exercise Plan  No updates this day    Consulted and Agree with Plan of Care  Patient       Patient will benefit from skilled therapeutic intervention in order to improve the following deficits and impairments:  Abnormal gait, Cardiopulmonary status limiting activity, Decreased activity tolerance, Decreased balance, Decreased endurance, Decreased range of motion, Difficulty walking, Decreased strength, Hypomobility, Decreased mobility, Impaired perceived functional ability, Impaired flexibility, Postural dysfunction, Obesity, Improper body mechanics, Pain  Visit Diagnosis: 1. Postmastectomy lymphedema syndrome   2. Chronic low back pain, unspecified back pain laterality, unspecified whether sciatica present   3. Abnormal posture   4. Muscle weakness (generalized)   5. Other abnormalities of gait and mobility        Problem List Patient Active Problem List   Diagnosis Date Noted  . Malignant neoplasm of central portion of right breast in female, estrogen receptor positive ([place] 02/12/2019  . Genetic testing 01/31/2019  . Malignant neoplasm of upper-inner quadrant of right breast in female, estrogen receptor positive ([place] 01/15/2019  . Long term current use of opiate analgesic 12/02/2018  . Tympanic membrane central perforation, left 11/04/2018  . DDD (degenerative disc disease), lumbosacral 10/10/2018  . Abnormal MRI, lumbar spine (08/29/2018) 10/10/2018  . 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 Lumbar nerve root  impingement (Bilateral: L4 & L5) (L>R: L5) 10/10/2018  . Lumbar Spinal instability of L4-5 10/10/2018  . Lumbar facet hypertrophy 10/10/2018  . COPD (chronic obstructive pulmonary disease) with emphysema ([place]) 08/28/2018  . Elevated rheumatoid factor 08/13/2018  . Elevated C-reactive protein (CRP) 08/12/2018  . Elevated sed rate 08/12/2018  . Lumbar facet syndrome (Bilateral) 08/12/2018  . Back pain with left-sided radiculopathy 08/12/2018  . DDD (degenerative disc disease), lumbar 08/12/2018  . Chronic lower extremity pain (Primary Area of Pain) (Bilateral) (L>R) 07/23/2018  . Chronic low back pain (Secondary Area of Pain) (Bilateral) (L>R) w/ sciatica (Bilateral) 07/23/2018  . Chronic pain syndrome 07/23/2018  . Pharmacologic therapy 07/23/2018  . Disorder of skeletal system 07/23/2018  . Problems influencing health status 07/23/2018  . Chronic sacroiliac joint pain [facility] Area of Pain) (Bilateral) (L>R) 07/23/2018  . Status post lumboperitoneal shunt placement 02/26/2018  . 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 Tympanic membrane perforation 05/30/2016  . PMB (postmenopausal bleeding) 04/26/2016  . Rash 12/27/2015  . 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 Plantar fasciitis of foot (Left) 05/26/2015  . Spondylosis of lumbar region without myelopathy or radiculopathy 05/26/2015  . Lumbar foraminal stenosis (L3-4, L4-5, L5-S1) (L>R) 05/26/2015  . Spinal stenosis of lumbar region with neurogenic claudication 05/26/2015  . Encounter for screening mammogram for malignant neoplasm of breast 12/02/2014  . Goiter 12/02/2014  . Chronic idiopathic gout of foot (Right) 07/06/2014  . Biceps tendonitis (Right) 05/19/2014  . Shoulder pain (Right) 05/19/2014  . Facial weakness 05/05/2014  . Post herpetic neuralgia 05/05/2014  . Chronic dysfunction of left eustachian tube 03/24/2014  . Osteopenia 03/13/2014  . Lumbar  Grade 1 Anterolisthesis of L3/4 (47m) & L4/5 (4-158m(w/ Dynamic instability) 03/13/2014  . Family history of colon cancer 12/02/2013  . Dyshidrotic eczema 11/17/2013  . Diastasis recti 04/08/2013  . Facial nerve paresis 07/31/2012  . Blepharospasm 05/08/2012  . 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 Obesity 03/28/2012  . Hemoptysis 12/22/2011  . Neuropathic postherpetic trigeminal neuralgia 12/12/2011  . Allergic rhinitis, mild 10/12/2011  . Disequilibrium syndrome 09/27/2011  . Chronic bronchitis ([place]09/24/2012  . Essential hypertension 05/24/2011  . Type 2 diabetes mellitus without complications ([facility] [place]0941/74/0814. Pulmonary nodules 05/05/2011   3:59 PM, 03/10/19 AlEtta GrandchildPT, DPT Physical Therapist - CoWilliamstown Medical CenterOutpatient Physical [phone]    BuEtta Grandchild/29/2020, 3:57 PM  CoWest Valley CityAIN RECrane Memorial HospitalERVICES [id number]: [phone] Fax:  [phone]: Susan SachsRN: [id number] of Birth: [birth year]

## 2019-03-11 DIAGNOSIS — N6489 Other specified disorders of breast: Secondary | ICD-10-CM | POA: Insufficient documentation

## 2019-03-11 LAB — NOVEL CORONAVIRUS, NAA (HOSP ORDER, SEND-OUT TO REF LAB; TAT 18-24 HRS): SARS-CoV-2, NAA: NOT DETECTED

## 2019-03-12 ENCOUNTER — Ambulatory Visit: Payer: Medicare Other

## 2019-03-12 ENCOUNTER — Ambulatory Visit: Payer: Medicare Other | Admitting: Occupational Therapy

## 2019-03-13 ENCOUNTER — Ambulatory Visit: Payer: Medicare Other | Attending: Pain Medicine | Admitting: Pain Medicine

## 2019-03-13 ENCOUNTER — Other Ambulatory Visit: Payer: Self-pay

## 2019-03-13 ENCOUNTER — Ambulatory Visit: Payer: Medicare Other | Admitting: Occupational Therapy

## 2019-03-13 ENCOUNTER — Ambulatory Visit: Admission: RE | Admit: 2019-03-13 | Payer: Medicare Other | Source: Ambulatory Visit

## 2019-03-13 DIAGNOSIS — T8149XA Infection following a procedure, other surgical site, initial encounter: Secondary | ICD-10-CM

## 2019-03-13 DIAGNOSIS — N6489 Other specified disorders of breast: Secondary | ICD-10-CM

## 2019-03-13 MED ORDER — MIDAZOLAM HCL 5 MG/5ML IJ SOLN: 1.0000 mg | INTRAMUSCULAR | Status: DC | PRN

## 2019-03-13 MED ORDER — FENTANYL CITRATE (PF) 100 MCG/2ML IJ SOLN: 25.0000 ug | INTRAMUSCULAR | Status: DC | PRN

## 2019-03-13 MED ORDER — ROPIVACAINE HCL 2 MG/ML IJ SOLN: 2.0000 mL | Freq: Once | INTRAMUSCULAR | Status: DC

## 2019-03-13 MED ORDER — SODIUM CHLORIDE 0.9% FLUSH: 2.0000 mL | Freq: Once | INTRAVENOUS | Status: DC

## 2019-03-13 MED ORDER — LIDOCAINE HCL 2 % IJ SOLN: 20.0000 mL | Freq: Once | INTRAMUSCULAR | Status: DC

## 2019-03-13 MED ORDER — DEXAMETHASONE SODIUM PHOSPHATE 10 MG/ML IJ SOLN: 20.0000 mg | Freq: Once | INTRAMUSCULAR | Status: DC

## 2019-03-13 MED ORDER — TRIAMCINOLONE ACETONIDE 40 MG/ML IJ SUSP: 40.0000 mg | Freq: Once | INTRAMUSCULAR | Status: DC

## 2019-03-13 MED ORDER — IOHEXOL 180 MG/ML  SOLN: 10.0000 mL | Freq: Once | INTRAMUSCULAR | Status: DC

## 2019-03-13 MED ORDER — LACTATED RINGERS IV SOLN: 1000.0000 mL | Freq: Once | INTRAVENOUS | Status: DC

## 2019-03-13 NOTE — Progress Notes (Signed)
Procedure cancelled due to infected wound on breast seroma biopsy site. We'll reschedule our elective procedure.

## 2019-03-13 NOTE — Progress Notes (Signed)
Patient reports sentinel Node Bx performed 1 month ago.  Abscess, draining and antibiotic therapy currently.  Reported to Dr Dossie Arbour and appt rescheduled when patient is completely healed.

## 2019-03-17 ENCOUNTER — Ambulatory Visit: Payer: Medicare Other | Attending: Rehabilitation

## 2019-03-17 ENCOUNTER — Other Ambulatory Visit: Payer: Self-pay

## 2019-03-17 ENCOUNTER — Ambulatory Visit: Payer: Medicare Other | Admitting: Occupational Therapy

## 2019-03-17 DIAGNOSIS — G8929 Other chronic pain: Secondary | ICD-10-CM | POA: Diagnosis present

## 2019-03-17 DIAGNOSIS — I972 Postmastectomy lymphedema syndrome: Secondary | ICD-10-CM | POA: Insufficient documentation

## 2019-03-17 DIAGNOSIS — R293 Abnormal posture: Secondary | ICD-10-CM | POA: Diagnosis present

## 2019-03-17 DIAGNOSIS — M545 Low back pain, unspecified: Secondary | ICD-10-CM

## 2019-03-17 DIAGNOSIS — R2689 Other abnormalities of gait and mobility: Secondary | ICD-10-CM | POA: Insufficient documentation

## 2019-03-17 DIAGNOSIS — M6281 Muscle weakness (generalized): Secondary | ICD-10-CM | POA: Diagnosis present

## 2019-03-17 NOTE — Therapy (Signed)
[facility] SERVICES [address] [place], [place], [address] Phone: [phone]   Fax:  [phone]  Physical Therapy Treatment/ RECERT  Patient Details  Name: Susan Callahan MRN: [id number] Date of Birth: [birth year] Referring Provider (PT): Francesco Sor   Encounter Date: 03/17/2019  PT End of Session - 03/17/19 1444    Visit Number  38    Number of Visits  46    Date for PT Re-Evaluation  04/14/19    Authorization Type  8/10 starting 11/21/2018    PT Start Time  1431    PT Stop Time  1514    PT Time Calculation (min)  43 min    Activity Tolerance  Patient tolerated treatment well;Patient limited by pain    Behavior During Therapy  [facility] for tasks assessed/performed       Past Medical History:  Diagnosis Date  . COPD (chronic obstructive pulmonary disease) ([place])   . Depression 1974  . Diabetes mellitus without complication ([place])   . Hypertension   . Migraine   . 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 LUMBAR PERITONEAL SHUNT    . TYMPANOSTOMY TUBE PLACEMENT      There were no vitals filed for this visit.  Subjective Assessment - 03/17/19 1440    Subjective  Patient returning after week hiatus due to new onset of cellulitis.Patient was not able to have epidural injection due to being on doxy. Has noted tightness of L leg, Per her report she Has been stretching and moving and just has not been happy. Today was her last day of taking doxycylone for her cellulitis s/p infection. Patient reports it is still swollen/pink with limited healing of incision site    Pertinent History  Pain began in 2007 and has gradually got worse and has progressed to nonstop. Patient states pain last all day long, everyday with no change in symptoms. Medication helps briefly. States she has difficulty with showering and standing. States she has to stand with her knee bent due to pulling  sensation in legs. Pain begins immediately when stands and begins to walk. Pain feels better when she bends her knees when standing. Pain became constant and non stop around Dec 2018. Denies bowel or bladder changes, unexplained weakness, or unexpected weight loss/gain. She currently goes to pulmonary rehab 3x a week, but has increased pain with treadmill walking. Will walk with cane on occasion around house especially at night due to occasional dizziness and vision impairments in medical history. States she also has difficulty with carrying groceries up steps due to low back pain. She would like to be able to walk better and have less pain to be less reliant on medication.     Currently in Pain?  Yes    Pain Score  4     Pain Location  Back    Pain Orientation  Lower;Left    Pain Descriptors / Indicators  Discomfort    Pain Type  Chronic pain    Pain Score  5    Pain Location  Axilla    Pain Orientation  Right    Pain Descriptors / Indicators  Aching;Burning    Pain Type  Acute pain;Neuropathic pain    Pain Onset  More than a month ago    Pain Frequency  Intermittent       Patient was not able to have epidural injection due to being on  doxy. Has noted tightness of L leg, Per her report she Has been stretching and moving and just has not been happy. Today was her last day of taking doxycylone for her cellulitis s/p infection. Patient reports it is still swollen/pink with limited healing of incision site. P   Goals:   VAS: 7/10  Ambulate on treadmill 10 minutes: not on treadmill but has been able to walk 10 minutes straight with pain with an increase of 2 points.  MODI: 38%  BLE strength : grossly 4-/5 with pain with LLE movements Make/strip bed: has been having less difficulty, still needs to stop at least once.    Manual Therapy L/R single knee to chest stretch x 30seach LE; Hamstring stretch in supine with increasing range 60 seconds each LE Supine piriformis stretch LLE 60 seconds   SAD with belt LLE5x 20 seconds inferior direction SAD with piriformis position (figure 4) with belt LLE5x20 seconds inferior direction   Ther-ex: cueing for sequencing, positioning and muscle activation hooklying adduction squeezes 20x 3 second holds with posterior pelvic tilt  hooklying abduction RTB 15x3 second holds with posterior pelvic tilt  Hooklying HS stretch with DF/PF for sciatic nerve mobilityx20bilateral; Posterior pelvic tilts x15.tactileand verbalcue for appropriate technique5 seconds with hold, improved alignment   Ambulate length of gym with focus on heel strike of LLE, 4x with increasing gait mechanics with repetition, additional TrA contraction with ambulation performed with improved gait mechanics and decreased discomfort with ambulation     Pt educated throughout session about proper posture and technique with exercises. Improved exercise technique, movement at target joints, use of target muscles after min to mod verbal, visual, tactile cues  Patient presents with progress towards goals with improved MODI score and ability to perform with mobility and iADLs. Patient continues to have pain with LLE>RLE. Despite patient's medical changes she continues to progress from a physical therapy standpoint. Pt demonstrates copious motivation, focused on best performance this date. Careful attention remains on slow progression of strengthening activities as abdominal wall imbalances remain present. Patient will continue to benefit from skilled physical therapy to improve strength, to reduce pain levels with ADLs, and to improve quality of life.               PT Education - 03/17/19 1443    Education Details  exercise technique, manual, goals, POC    Person(s) Educated  Patient    Methods  Explanation;Demonstration;Tactile cues;Verbal cues;Handout    Comprehension  Verbalized understanding;Returned demonstration;Verbal cues required;Tactile cues required        PT Short Term Goals - 03/17/19 1447      PT SHORT TERM GOAL #1   Title  Patient will be independent with completion of HEP to improve ability to complete functional tasks and functional ADLs.    Baseline  10/23: will give HEP next session 11/19: HEP compliance    Time  2    Period  Weeks    Status  Achieved      PT SHORT TERM GOAL #2   Title  Patient will report pain score less than 4/10 on VAS to demonstrate reduction of pain levels with functional tasks.     Baseline  10/23: 6/10 11/19: 5/10 12/12: 9/10 1/10: 7/10  2/11: 3/10     Time  2    Period  Weeks    Status  Achieved        PT Long Term Goals - 03/17/19 1447      PT LONG TERM  GOAL #1   Title  Patient will report pain score of <3/10 on VAS for decreased pain levels and ease with functional activities.    Baseline  10/23: 6/10 11/19: 5/10 12/12 9/10 1/10: 7/10 2/11: 3/10 3/9: 6/10  5/26: 8/10 03/17/19: 7/10    Time  4    Period  Weeks    Status  Partially Met    Target Date  04/14/19      PT LONG TERM GOAL #2   Title  Patient will be able to ambulate on treadmill for 80mnutes without increase in back/leg pain demonstrating improved community ambulation and treadmill walking at pulmonary rehab    Baseline  10/23: 067mutes increases pain 11/19: 69m37mtes 12/12: has not had the chance to perform 1/10: was able to do 5 minutes without pain 2/11: able to do 5 minutes then had to bend over 3/9: 5 minutes 30 seconds at 2.0 mphs 5/26: had to walk 10 minutes at cancer center with extreme pain but was able to perform 7/6: walks 10 minutes with increase of pain by 2 points    Time  4    Period  Weeks    Status  Partially Met    Target Date  04/14/19      PT LONG TERM GOAL #3   Title  Patient will have decreased modified oswestry score <50% for improved ability to complete household tasks with less pain levels.     Baseline  10/23: 62% 11/19: 42%      Time  4    Period  Weeks    Status  Achieved      PT LONG TERM GOAL #4    Title  Patient will score 4+/5 on BLE MMT to demonstrate improved glute strength and ability to carry groceries with increased ease up/down stairs at home.    Baseline  10/23: 4-/5 grossly 11/19: 4/5 grossly; hamstrings 4-/5 12/12: 4/5 gross 1/10: 4/5 gross 2/11: gross 4+/5 with hamstrings and abuductors 4-/5 3/9: gross 4+/5 hamstrings and abductors 4-/5 5/27: grossly 3+/5 with pain with LLE movements 7/6: grossly 4-/5 with pain with LLE movement    Time  4    Period  Weeks    Status  On-going    Target Date  04/14/19      PT LONG TERM GOAL #5   Title  Patient will have decreased modified oswestry score <35% for improved ability to complete household tasks with less pain levels.     Baseline  11/19: 42% 12/12: 52% 1/10: 48%  2/11: 30%     Time  4    Period  Weeks    Status  Achieved      PT LONG TERM GOAL #6   Title  Patient will be able to make and/or strip the bed without pain and without needing rest breaks to perform iADLs independently.     Baseline  2/11: pain limits ability to perform, ~4 breaks required 3/9: takes her time, 3 breaks 5/27: very painful still 7/6: requires only one rest break    Time  4    Period  Weeks    Status  Partially Met    Target Date  04/14/19      PT LONG TERM GOAL #7   Title  Patient will have decreased modified oswestry score <20% for improved ability to complete household tasks with less pain levels.     Baseline  2/11: 30%  3/9: 30% 5/27: 63% 7/6: 38%    Time  4    Period  Weeks    Status  On-going    Target Date  04/14/19            Plan - 03/17/19 1749    Clinical Impression Statement  Patient presents with progress towards goals with improved MODI score and ability to perform with mobility and iADLs. Patient continues to have pain with LLE>RLE. Despite patient's medical changes she continues to progress from a physical therapy standpoint. Pt demonstrates copious motivation, focused on best performance this date. Careful attention  remains on slow progression of strengthening activities as abdominal wall imbalances remain present. Patient will continue to benefit from skilled physical therapy to improve strength, to reduce pain levels with ADLs, and to improve quality of life.    Rehab Potential  Fair    Clinical Impairments Affecting Rehab Potential  (+) motivation (-) chronicity of symptoms    PT Frequency  2x / week    PT Duration  4 weeks    PT Treatment/Interventions  ADLs/Self Care Home Management;Cryotherapy;Moist Heat;Traction;Iontophoresis 105m/ml Dexamethasone;Ultrasound;Gait training;Stair training;Functional mobility training;Neuromuscular re-education;Balance training;Therapeutic exercise;Therapeutic activities;Patient/family education;Manual techniques;Energy conservation;Aquatic Therapy;Electrical Stimulation;Passive range of motion;Dry needling;Taping    PT Next Visit Plan  cross body coordination, progress core/postural stability, progress BLE strengthening    PT Home Exercise Plan  No updates this day    Consulted and Agree with Plan of Care  Patient       Patient will benefit from skilled therapeutic intervention in order to improve the following deficits and impairments:  Abnormal gait, Cardiopulmonary status limiting activity, Decreased activity tolerance, Decreased balance, Decreased endurance, Decreased range of motion, Difficulty walking, Decreased strength, Hypomobility, Decreased mobility, Impaired perceived functional ability, Impaired flexibility, Postural dysfunction, Obesity, Improper body mechanics, Pain  Visit Diagnosis: 1. Chronic low back pain, unspecified back pain laterality, unspecified whether sciatica present   2. Abnormal posture   3. Muscle weakness (generalized)   4. Other abnormalities of gait and mobility        Problem List Patient Active Problem List   Diagnosis Date Noted  . Postoperative wound infection 03/13/2019  . Seroma of breast 03/11/2019  . Age-related  osteoporosis without current pathological fracture 02/27/2019  . Malignant neoplasm of central portion of right breast in female, estrogen receptor positive ([place] 02/12/2019  . Genetic testing 01/31/2019  . Malignant neoplasm of upper-inner quadrant of right breast in female, estrogen receptor positive ([place] 01/15/2019  . Long term current use of opiate analgesic 12/02/2018  . Tympanic membrane central perforation, left 11/04/2018  . DDD (degenerative disc disease), lumbosacral 10/10/2018  . Abnormal MRI, lumbar spine (08/29/2018) 10/10/2018  . 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 Lumbar nerve root impingement (Bilateral: L4 & L5) (L>R: L5) 10/10/2018  . Lumbar Spinal instability of L4-5 10/10/2018  . Lumbar facet hypertrophy 10/10/2018  . COPD (chronic obstructive pulmonary disease) with emphysema ([place] 08/28/2018  . Elevated rheumatoid factor 08/13/2018  . Elevated C-reactive protein (CRP) 08/12/2018  . Elevated sed rate 08/12/2018  . Lumbar facet syndrome (Bilateral) 08/12/2018  . Back pain with left-sided radiculopathy 08/12/2018  . DDD (degenerative disc disease), lumbar 08/12/2018  . Chronic lower extremity pain (Primary Area of Pain) (Bilateral) (L>R) 07/23/2018  . Chronic low back pain (Secondary Area of Pain) (Bilateral) (L>R) w/ sciatica (Bilateral) 07/23/2018  . Chronic pain syndrome 07/23/2018  . Pharmacologic therapy 07/23/2018  . Disorder of skeletal system 07/23/2018  . Problems influencing health status 07/23/2018  . Chronic sacroiliac joint  pain [facility] Area of Pain) (Bilateral) (L>R) 07/23/2018  . Status post lumboperitoneal shunt placement 02/26/2018  . 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 Tympanic membrane perforation 05/30/2016  . PMB (postmenopausal bleeding) 04/26/2016  . Rash 12/27/2015  . 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 Plantar  fasciitis of foot (Left) 05/26/2015  . Spondylosis of lumbar region without myelopathy or radiculopathy 05/26/2015  . Lumbar foraminal stenosis (L3-4, L4-5, L5-S1) (L>R) 05/26/2015  . Spinal stenosis of lumbar region with neurogenic claudication 05/26/2015  . Encounter for screening mammogram for malignant neoplasm of breast 12/02/2014  . Goiter 12/02/2014  . Chronic idiopathic gout of foot (Right) 07/06/2014  . Biceps tendonitis (Right) 05/19/2014  . Shoulder pain (Right) 05/19/2014  . Facial weakness 05/05/2014  . Post herpetic neuralgia 05/05/2014  . Chronic dysfunction of left eustachian tube 03/24/2014  . Osteopenia 03/13/2014  . Lumbar Grade 1 Anterolisthesis of L3/4 (54m) & L4/5 (4-159m(w/ Dynamic instability) 03/13/2014  . Family history of colon cancer 12/02/2013  . Dyshidrotic eczema 11/17/2013  . Diastasis recti 04/08/2013  . Facial nerve paresis 07/31/2012  . Blepharospasm 05/08/2012  . 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 Obesity 03/28/2012  . Hemoptysis 12/22/2011  . Neuropathic postherpetic trigeminal neuralgia 12/12/2011  . Allergic rhinitis, mild 10/12/2011  . Disequilibrium syndrome 09/27/2011  . Chronic bronchitis ([place]CL[place]09/24/2012  . Essential hypertension 05/24/2011  . Type 2 diabetes mellitus without complications ([id number]/4315. Pulmonary nodules 05/05/2011   MaJanna ArchPT, DPT   03/17/2019, 5:51 PM  [place] REPacific [facility] LLCERVICES [address] [id number]: [phone] Fax:  [phone]: LiAllegra CernigliaRN: [id number] of Birth: 02/1951/08/01

## 2019-03-19 ENCOUNTER — Ambulatory Visit: Payer: Medicare Other | Admitting: Occupational Therapy

## 2019-03-19 ENCOUNTER — Other Ambulatory Visit: Payer: Self-pay

## 2019-03-19 ENCOUNTER — Ambulatory Visit: Payer: Medicare Other

## 2019-03-19 DIAGNOSIS — R293 Abnormal posture: Secondary | ICD-10-CM

## 2019-03-19 DIAGNOSIS — M6281 Muscle weakness (generalized): Secondary | ICD-10-CM

## 2019-03-19 DIAGNOSIS — I972 Postmastectomy lymphedema syndrome: Secondary | ICD-10-CM

## 2019-03-19 DIAGNOSIS — M545 Low back pain: Secondary | ICD-10-CM | POA: Diagnosis not present

## 2019-03-19 DIAGNOSIS — G8929 Other chronic pain: Secondary | ICD-10-CM

## 2019-03-19 NOTE — Therapy (Signed)
[place] MAIN [facility] SERVICES [address] [place], [place], [address] Phone: [phone]   Fax:  [phone]  Occupational Therapy Treatment  Patient Details  Name: Susan Callahan MRN: [id number] Date of Birth: 1951/03/28 Referring Provider (OT): Humberto Leep, MD   Encounter Date: 03/19/2019  OT End of Session - 03/19/19 1741    Visit Number  4    Number of Visits  36    Date for OT Re-Evaluation  05/20/19    OT Start Time  0315    OT Stop Time  0420    OT Time Calculation (min)  65 min    Activity Tolerance  Patient tolerated treatment well;No increased pain    Behavior During Therapy  WFL for tasks assessed/performed       Past Medical History:  Diagnosis Date  . COPD (chronic obstructive pulmonary disease) ([place])   . Depression 1974  . Diabetes mellitus without complication ([place])   . Hypertension   . Migraine   . 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 LUMBAR PERITONEAL SHUNT    . TYMPANOSTOMY TUBE PLACEMENT      There were no vitals filed for this visit.  Subjective Assessment - 03/19/19 1734    Subjective   Pt presents for OT visit 3/ 36 to address RUE/ RUQ post-mastectomy swelling and axillary web syndrome. Pt reports she feels less painful in axilla and has been able to move her arm easier for the past couple of days.    Currently in Pain?  Yes    Pain Score  --   not rated numerically   Pain Location  Axilla    Pain Orientation  Right    Pain Descriptors / Indicators  Burning;Sore;Numbness;Tightness;Guarding    Pain Onset  1 to 4 weeks ago    Pain Onset  More than a month ago                   OT Treatments/Exercises (OP) - 03/19/19 0001      ADLs   ADL Education Given  Yes      Manual Therapy   Manual Therapy  Edema management;Manual Lymphatic Drainage (MLD);Other (comment)    Manual Lymphatic Drainage (MLD)  MLD to RUE/ RUQ  utilizing  ipsilateral axillo-inguinal watershed, and anterior and posterior axillary anastamoses. Pt tolerated well in supine and sidelying. No myofacial release today.             OT Education - 03/19/19 1740    Education Details  Continued skilled Pt/caregiver education  And LE ADL training throughout visit for lymphedema self care/ home program, including compression wrapping, compression garment and device wear/care, lymphatic pumping ther ex, simple self-MLD, and skin care. Discussed progress towards goals.    Person(s) Educated  Patient    Methods  Demonstration;Explanation;Tactile cues;Verbal cues;Handout    Comprehension  Verbalized understanding;Returned demonstration          OT Long Term Goals - 02/20/19 1650      OT LONG TERM GOAL #1   Title  Pt independent with lymphedema precautions and prevention strategies to limit LE progression.    Baseline  Max A    Time  4    Period  Days    Status  New    Target Date  --   4th OT Rx visit     OT LONG TERM GOAL #2   Title  Pt  will be independent with lymphedema self-care home program and AWS ther ex to improve functional performance of basic and instrumental ADLs by DC.    Baseline  Max A    Time  12    Period  Weeks    Status  New    Target Date  05/20/19   4th OT Rx visit     OT LONG TERM GOAL #3   Title  Pt wil report 0/10 pain with RUE functional use ( reaching, lifting, carrying, dressing, dressing, etc) to achieve independent , pain free ADLs performance.    Baseline  Max A    Time  12    Period  Weeks    Status  New    Target Date  05/20/19            Plan - 03/19/19 1742    Clinical Impression Statement  Redness and swelling in axilla has reduced markedly reduced since last visit. Pt presenting with mildly increased R breast swelling today. Pain has substantially reduced and Pt had no difficulty tolerating  MLD. Cont as per POC.    OT Occupational Profile and History  Comprehensive Assessment- Review of  records and extensive additional review of physical, cognitive, psychosocial history related to current functional performance    Occupational performance deficits (Please refer to evaluation for details):  ADL's;IADL's;Social Participation;Leisure;Rest and Sleep;Work    Marketing executive / Function / Physical Skills  ADL;Decreased knowledge of precautions;ROM;UE functional use;Decreased knowledge of use of DME;Edema;Skin integrity;Pain;IADL    Rehab Potential  Good    Clinical Decision Making  Several treatment options, min-mod task modification necessary    Comorbidities Affecting Occupational Performance:  Presence of comorbidities impacting occupational performance    Comorbidities impacting occupational performance description:  see SUBJUECTIVE    Modification or Assistance to Complete Evaluation   Min-Moderate modification of tasks or assist with assess necessary to complete eval    OT Frequency  2x / week    OT Duration  12 weeks    OT Treatment/Interventions  Self-care/ADL training;Therapeutic exercise;Manual lymph drainage;Patient/family education;Therapeutic activities;DME and/or AE instruction;Manual Therapy;Other (comment)   consider fitting with prophylactic cmopression arm sleeve   Plan  BUE AROM ther ex to reduce axillary cording- 2 sets of 10 2-3 x daily    Consulted and Agree with Plan of Care  Patient       Patient will benefit from skilled therapeutic intervention in order to improve the following deficits and impairments:   Body Structure / Function / Physical Skills: ADL, Decreased knowledge of precautions, ROM, UE functional use, Decreased knowledge of use of DME, Edema, Skin integrity, Pain, IADL       Visit Diagnosis: 1. Postmastectomy lymphedema syndrome       Problem List Patient Active Problem List   Diagnosis Date Noted  . Postoperative wound infection 03/13/2019  . Seroma of breast 03/11/2019  . Age-related osteoporosis without current pathological fracture  02/27/2019  . Malignant neoplasm of central portion of right breast in female, estrogen receptor positive ([place]) 02/12/2019  . Genetic testing 01/31/2019  . Malignant neoplasm of upper-inner quadrant of right breast in female, estrogen receptor positive ([place]) 01/15/2019  . Long term current use of opiate analgesic 12/02/2018  . Tympanic membrane central perforation, left 11/04/2018  . DDD (degenerative disc disease), lumbosacral 10/10/2018  . Abnormal MRI, lumbar spine (08/29/2018) 10/10/2018  . 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 Lumbar nerve root impingement (Bilateral: L4 & L5) (L>R:  L5) 10/10/2018  . Lumbar Spinal instability of L4-5 10/10/2018  . Lumbar facet hypertrophy 10/10/2018  . COPD (chronic obstructive pulmonary disease) with emphysema ([place]) 08/28/2018  . Elevated rheumatoid factor 08/13/2018  . Elevated C-reactive protein (CRP) 08/12/2018  . Elevated sed rate 08/12/2018  . Lumbar facet syndrome (Bilateral) 08/12/2018  . Back pain with left-sided radiculopathy 08/12/2018  . DDD (degenerative disc disease), lumbar 08/12/2018  . Chronic lower extremity pain (Primary Area of Pain) (Bilateral) (L>R) 07/23/2018  . Chronic low back pain (Secondary Area of Pain) (Bilateral) (L>R) w/ sciatica (Bilateral) 07/23/2018  . Chronic pain syndrome 07/23/2018  . Pharmacologic therapy 07/23/2018  . Disorder of skeletal system 07/23/2018  . Problems influencing health status 07/23/2018  . Chronic sacroiliac joint pain [facility] Area of Pain) (Bilateral) (L>R) 07/23/2018  . Status post lumboperitoneal shunt placement 02/26/2018  . 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 Tympanic membrane perforation 05/30/2016  . PMB (postmenopausal bleeding) 04/26/2016  . Rash 12/27/2015  . 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 Plantar fasciitis of foot (Left) 05/26/2015  . Spondylosis  of lumbar region without myelopathy or radiculopathy 05/26/2015  . Lumbar foraminal stenosis (L3-4, L4-5, L5-S1) (L>R) 05/26/2015  . Spinal stenosis of lumbar region with neurogenic claudication 05/26/2015  . Encounter for screening mammogram for malignant neoplasm of breast 12/02/2014  . Goiter 12/02/2014  . Chronic idiopathic gout of foot (Right) 07/06/2014  . Biceps tendonitis (Right) 05/19/2014  . Shoulder pain (Right) 05/19/2014  . Facial weakness 05/05/2014  . Post herpetic neuralgia 05/05/2014  . Chronic dysfunction of left eustachian tube 03/24/2014  . Osteopenia 03/13/2014  . Lumbar Grade 1 Anterolisthesis of L3/4 (56mm) & L4/5 (4-24mm)(w/ Dynamic instability) 03/13/2014  . Family history of colon cancer 12/02/2013  . Dyshidrotic eczema 11/17/2013  . Diastasis recti 04/08/2013  . Facial nerve paresis 07/31/2012  . Blepharospasm 05/08/2012  . 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 Obesity 03/28/2012  . Hemoptysis 12/22/2011  . Neuropathic postherpetic trigeminal neuralgia 12/12/2011  . Allergic rhinitis, mild 10/12/2011  . Disequilibrium syndrome 09/27/2011  . Chronic bronchitis ([place]) 06/05/2011  . Essential hypertension 05/24/2011  . Type 2 diabetes mellitus without complications ([place]) 09/03/8249  . Pulmonary nodules 05/05/2011    Andrey Spearman, MS, OTR/L, [facility] 03/19/19 5:49 PM  [place] MAIN [facility] SERVICES [address] [place], [place], [address] Phone: [phone]   Fax:  [phone]  Name: Susan Callahan MRN: [id number] Date of Birth: August 13, 1951

## 2019-03-19 NOTE — Therapy (Signed)
[place] MAIN [facility] SERVICES [address] [place], [place], [address] Phone: [phone]   Fax:  [phone]  Physical Therapy Treatment  Patient Details  Name: Susan Callahan MRN: [id number] Date of Birth: 11/03/1950 Referring Provider (PT): Francesco Sor   Encounter Date: 03/19/2019  PT End of Session - 03/19/19 1507    Visit Number  39    Number of Visits  46    Date for PT Re-Evaluation  04/14/19    Authorization Type  9/10 starting 11/21/2018    PT Start Time  1435    PT Stop Time  1515    PT Time Calculation (min)  40 min    Activity Tolerance  Patient tolerated treatment well;Patient limited by pain    Behavior During Therapy  [facility] for tasks assessed/performed       Past Medical History:  Diagnosis Date  . COPD (chronic obstructive pulmonary disease) ([place])   . Depression 1974  . Diabetes mellitus without complication ([place])   . Hypertension   . Migraine   . 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 LUMBAR PERITONEAL SHUNT    . TYMPANOSTOMY TUBE PLACEMENT      There were no vitals filed for this visit.  Subjective Assessment - 03/19/19 1440    Subjective  Patient went to physician and is having imporved symptoms from cellulitis. Patient continues to e compliant with HEP with no fals or LOB since last session.    Pertinent History  Pain began in 2007 and has gradually got worse and has progressed to nonstop. Patient states pain last all day long, everyday with no change in symptoms. Medication helps briefly. States she has difficulty with showering and standing. States she has to stand with her knee bent due to pulling sensation in legs. Pain begins immediately when stands and begins to walk. Pain feels better when she bends her knees when standing. Pain became constant and non stop around Dec 2018. Denies bowel or bladder changes, unexplained weakness, or unexpected weight  loss/gain. She currently goes to pulmonary rehab 3x a week, but has increased pain with treadmill walking. Will walk with cane on occasion around house especially at night due to occasional dizziness and vision impairments in medical history. States she also has difficulty with carrying groceries up steps due to low back pain. She would like to be able to walk better and have less pain to be less reliant on medication.     Currently in Pain?  Yes    Pain Score  2     Pain Location  Back    Pain Orientation  Lower    Pain Descriptors / Indicators  Discomfort    Pain Type  Chronic pain    Pain Onset  More than a month ago          Manual Therapy   Hamstring stretch in supine with increasing range 60 seconds each LE Roller to lumbar and thoracic paraspinals x 3 minutes.   SAD in figure 4 stretch with belt LLE 5x 20 seconds inferior direction   Ther-ex: cueing for sequencing, positioning and muscle activation Swiss ball forward trunk lean for spinal opening 10x forward, 5x to each angle with 10 second holds.  Seated on swiss ball with TrA activations 2 minutes Seated on swiss ball with UE raises 60 seconds for pertubations with TrA contraction Seated on swiss ball with heel lifts 10x  each LE for increased pertubation to TrA contraction  seated on swiss ball with foot raise/march 1 inch from floor 10x each LE, with TrA contraction  Hooklying HS stretch with DF/PF for sciatic nerve mobility x20 bilateral; Posterior pelvic tilts x15. tactile and verbal cue for appropriate technique 5 seconds with hold, improved alignment  seated piriformis stretch both ways 60 seconds holds x2     Ambulate length of gym with focus on heel strike of LLE, 4x with increasing gait mechanics with repetition, additional TrA contraction with ambulation performed with improved gait mechanics and decreased discomfort with ambulation     Pt educated throughout session about proper posture and technique with  exercises. Improved exercise technique, movement at target joints, use of target muscles after min to mod verbal, visual, tactile cues  Patient presents with increased mobility due to being able move R arm due to lymphedema treatment prior to this session allowing increased movement with interventions. Patient reports improved pain relief by end of session with increased pain free mobility. Stair negotiation and ambulatory mechanics analyzed with patient educated on corrective techniques for stability and decreasing strain on back. Patient will continue to benefit from skilled physical therapy to improve strength, to reduce pain levels with ADLs, and to improve quality of life.                    PT Education - 03/19/19 1507    Education Details  exercise technique, manual, abdominal activation    Person(s) Educated  Patient    Methods  Explanation;Demonstration;Tactile cues;Verbal cues    Comprehension  Verbalized understanding;Returned demonstration;Verbal cues required;Tactile cues required       PT Short Term Goals - 03/17/19 1447      PT SHORT TERM GOAL #1   Title  Patient will be independent with completion of HEP to improve ability to complete functional tasks and functional ADLs.    Baseline  10/23: will give HEP next session 11/19: HEP compliance    Time  2    Period  Weeks    Status  Achieved      PT SHORT TERM GOAL #2   Title  Patient will report pain score less than 4/10 on VAS to demonstrate reduction of pain levels with functional tasks.     Baseline  10/23: 6/10 11/19: 5/10 12/12: 9/10 1/10: 7/10  2/11: 3/10     Time  2    Period  Weeks    Status  Achieved        PT Long Term Goals - 03/17/19 1447      PT LONG TERM GOAL #1   Title  Patient will report pain score of <3/10 on VAS for decreased pain levels and ease with functional activities.    Baseline  10/23: 6/10 11/19: 5/10 12/12 9/10 1/10: 7/10 2/11: 3/10 3/9: 6/10  5/26: 8/10 03/17/19: 7/10     Time  4    Period  Weeks    Status  Partially Met    Target Date  04/14/19      PT LONG TERM GOAL #2   Title  Patient will be able to ambulate on treadmill for 39mnutes without increase in back/leg pain demonstrating improved community ambulation and treadmill walking at pulmonary rehab    Baseline  10/23: 0359mutes increases pain 11/19: 59m40mtes 12/12: has not had the chance to perform 1/10: was able to do 5 minutes without pain 2/11: able to do 5 minutes then had to bend  over 3/9: 5 minutes 30 seconds at 2.0 mphs 5/26: had to walk 10 minutes at cancer center with extreme pain but was able to perform 7/6: walks 10 minutes with increase of pain by 2 points    Time  4    Period  Weeks    Status  Partially Met    Target Date  04/14/19      PT LONG TERM GOAL #3   Title  Patient will have decreased modified oswestry score <50% for improved ability to complete household tasks with less pain levels.     Baseline  10/23: 62% 11/19: 42%      Time  4    Period  Weeks    Status  Achieved      PT LONG TERM GOAL #4   Title  Patient will score 4+/5 on BLE MMT to demonstrate improved glute strength and ability to carry groceries with increased ease up/down stairs at home.    Baseline  10/23: 4-/5 grossly 11/19: 4/5 grossly; hamstrings 4-/5 12/12: 4/5 gross 1/10: 4/5 gross 2/11: gross 4+/5 with hamstrings and abuductors 4-/5 3/9: gross 4+/5 hamstrings and abductors 4-/5 5/27: grossly 3+/5 with pain with LLE movements 7/6: grossly 4-/5 with pain with LLE movement    Time  4    Period  Weeks    Status  On-going    Target Date  04/14/19      PT LONG TERM GOAL #5   Title  Patient will have decreased modified oswestry score <35% for improved ability to complete household tasks with less pain levels.     Baseline  11/19: 42% 12/12: 52% 1/10: 48%  2/11: 30%     Time  4    Period  Weeks    Status  Achieved      PT LONG TERM GOAL #6   Title  Patient will be able to make and/or strip the bed without pain  and without needing rest breaks to perform iADLs independently.     Baseline  2/11: pain limits ability to perform, ~4 breaks required 3/9: takes her time, 3 breaks 5/27: very painful still 7/6: requires only one rest break    Time  4    Period  Weeks    Status  Partially Met    Target Date  04/14/19      PT LONG TERM GOAL #7   Title  Patient will have decreased modified oswestry score <20% for improved ability to complete household tasks with less pain levels.     Baseline  2/11: 30%  3/9: 30% 5/27: 63% 7/6: 38%    Time  4    Period  Weeks    Status  On-going    Target Date  04/14/19            Plan - 03/19/19 1510    Clinical Impression Statement  Patient presents with decreased pain to today's session allowing for progression of core interventions. Patient is challenged with decreasing points of contact on the floor with TrA activation. Patient experiences more relief with distraction in figure four position due to sciatic nerve involvement. Patient will continue to benefit from skilled physical therapy to improve strength, to reduce pain levels with ADLs, and to improve quality of life.    Rehab Potential  Fair    Clinical Impairments Affecting Rehab Potential  (+) motivation (-) chronicity of symptoms    PT Frequency  2x / week    PT Duration  4 weeks  PT Treatment/Interventions  ADLs/Self Care Home Management;Cryotherapy;Moist Heat;Traction;Iontophoresis 32m/ml Dexamethasone;Ultrasound;Gait training;Stair training;Functional mobility training;Neuromuscular re-education;Balance training;Therapeutic exercise;Therapeutic activities;Patient/family education;Manual techniques;Energy conservation;Aquatic Therapy;Electrical Stimulation;Passive range of motion;Dry needling;Taping    PT Next Visit Plan  cross body coordination, progress core/postural stability, progress BLE strengthening    PT Home Exercise Plan  No updates this day    Consulted and Agree with Plan of Care  Patient        Patient will benefit from skilled therapeutic intervention in order to improve the following deficits and impairments:  Abnormal gait, Cardiopulmonary status limiting activity, Decreased activity tolerance, Decreased balance, Decreased endurance, Decreased range of motion, Difficulty walking, Decreased strength, Hypomobility, Decreased mobility, Impaired perceived functional ability, Impaired flexibility, Postural dysfunction, Obesity, Improper body mechanics, Pain  Visit Diagnosis: 1. Chronic low back pain, unspecified back pain laterality, unspecified whether sciatica present   2. Abnormal posture   3. Muscle weakness (generalized)        Problem List Patient Active Problem List   Diagnosis Date Noted  . Postoperative wound infection 03/13/2019  . Seroma of breast 03/11/2019  . Age-related osteoporosis without current pathological fracture 02/27/2019  . Malignant neoplasm of central portion of right breast in female, estrogen receptor positive ([place] 02/12/2019  . Genetic testing 01/31/2019  . Malignant neoplasm of upper-inner quadrant of right breast in female, estrogen receptor positive ([place] 01/15/2019  . Long term current use of opiate analgesic 12/02/2018  . Tympanic membrane central perforation, left 11/04/2018  . DDD (degenerative disc disease), lumbosacral 10/10/2018  . Abnormal MRI, lumbar spine (08/29/2018) 10/10/2018  . 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 Lumbar nerve root impingement (Bilateral: L4 & L5) (L>R: L5) 10/10/2018  . Lumbar Spinal instability of L4-5 10/10/2018  . Lumbar facet hypertrophy 10/10/2018  . COPD (chronic obstructive pulmonary disease) with emphysema ([place] 08/28/2018  . Elevated rheumatoid factor 08/13/2018  . Elevated C-reactive protein (CRP) 08/12/2018  . Elevated sed rate 08/12/2018  . Lumbar facet syndrome (Bilateral) 08/12/2018  . Back pain with left-sided radiculopathy 08/12/2018  . DDD  (degenerative disc disease), lumbar 08/12/2018  . Chronic lower extremity pain (Primary Area of Pain) (Bilateral) (L>R) 07/23/2018  . Chronic low back pain (Secondary Area of Pain) (Bilateral) (L>R) w/ sciatica (Bilateral) 07/23/2018  . Chronic pain syndrome 07/23/2018  . Pharmacologic therapy 07/23/2018  . Disorder of skeletal system 07/23/2018  . Problems influencing health status 07/23/2018  . Chronic [facility]roiliac joint pain [facility]) (Bilateral) (L>R) 07/23/2018  . Status post lumboperitoneal shunt placement 02/26/2018  . 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 Tympanic membrane perforation 05/30/2016  . PMB (postmenopausal bleeding) 04/26/2016  . Rash 12/27/2015  . 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 Plantar fasciitis of foot (Left) 05/26/2015  . Spondylosis of lumbar region without myelopathy or radiculopathy 05/26/2015  . Lumbar foraminal stenosis (L3-4, L4-5, L5-S1) (L>R) 05/26/2015  . Spinal stenosis of lumbar region with neurogenic claudication 05/26/2015  . Encounter for screening mammogram for malignant neoplasm of breast 12/02/2014  . Goiter 12/02/2014  . Chronic idiopathic gout of foot (Right) 07/06/2014  . Biceps tendonitis (Right) 05/19/2014  . Shoulder pain (Right) 05/19/2014  . Facial weakness 05/05/2014  . Post herpetic neuralgia 05/05/2014  . Chronic dysfunction of left eustachian tube 03/24/2014  . Osteopenia 03/13/2014  . Lumbar Grade 1 Anterolisthesis of L3/4 (222m & L4/5 (4-1013mw/ Dynamic instability) 03/13/2014  . Family history of colon cancer 12/02/2013  .  Dyshidrotic eczema 11/17/2013  . Diastasis recti 04/08/2013  . Facial nerve paresis 07/31/2012  . Blepharospasm 05/08/2012  . 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 Obesity 03/28/2012  . Hemoptysis 12/22/2011  . Neuropathic postherpetic trigeminal neuralgia  12/12/2011  . Allergic rhinitis, mild 10/12/2011  . Disequilibrium syndrome 09/27/2011  . Chronic bronchitis ([place]) 06/05/2011  . Essential hypertension 05/24/2011  . Type 2 diabetes mellitus without complications ([place]) 69/24/9324  . Pulmonary nodules 05/05/2011    Janna Arch, PT, DPT   03/19/2019, 3:15 PM  [place] MAIN [facility] SERVICES [address] [place], [place], [address] Phone: [phone]   Fax:  [phone]  Name: Susan Callahan MRN: [id number] Date of Birth: Mar 28, 1951

## 2019-03-20 ENCOUNTER — Ambulatory Visit: Payer: Medicare Other | Admitting: Occupational Therapy

## 2019-03-20 ENCOUNTER — Other Ambulatory Visit: Payer: Self-pay

## 2019-03-20 DIAGNOSIS — I972 Postmastectomy lymphedema syndrome: Secondary | ICD-10-CM

## 2019-03-20 DIAGNOSIS — M545 Low back pain: Secondary | ICD-10-CM | POA: Diagnosis not present

## 2019-03-20 NOTE — Therapy (Signed)
[place] MAIN [facility] SERVICES [address] [place], [place], [address] Phone: [phone]   Fax:  [phone]  Occupational Therapy Treatment  Patient Details  Name: Susan Callahan MRN: [id number] Date of Birth: November 02, 1950 Referring Provider (OT): Humberto Leep, MD   Encounter Date: 03/20/2019  OT End of Session - 03/20/19 1505    Visit Number  5    Number of Visits  36    Date for OT Re-Evaluation  05/20/19    OT Start Time  0115    OT Stop Time  0220    OT Time Calculation (min)  65 min    Activity Tolerance  Patient tolerated treatment well;No increased pain    Behavior During Therapy  WFL for tasks assessed/performed       Past Medical History:  Diagnosis Date  . COPD (chronic obstructive pulmonary disease) ([place])   . Depression 1974  . Diabetes mellitus without complication ([place])   . Hypertension   . Migraine   . 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 LUMBAR PERITONEAL SHUNT    . TYMPANOSTOMY TUBE PLACEMENT      There were no vitals filed for this visit.  Subjective Assessment - 03/20/19 1317    Subjective   Pt presents for OT visit 4/ 36 to address RUE/ RUQ post-mastectomy swelling and axillary web syndrome. [name] states, "I feel better. I can do more. I still feel some twinges of pain wihen at rest and with movement....It's out of the blue."    Pain Onset  1 to 4 weeks ago    Pain Onset  More than a month ago                   OT Treatments/Exercises (OP) - 03/20/19 0001      ADLs   ADL Education Given  Yes  (Pended)       Manual Therapy   Manual Therapy  Edema management;Manual Lymphatic Drainage (MLD)  (Pended)     Manual Lymphatic Drainage (MLD)  MLD to RUE/ RUQ  utilizing ipsilateral axillo-inguinal watershed, and anterior and posterior axillary anastamoses. Pt tolerated well in supine and sidelying. No myofacial release today.  (Pended)               OT Education - 03/20/19 1503    Education Details  Pt able to palpate and see cording as web is rising more to surface of skin after a few treatments . Pt edu for AWS strtching ther ex. Handout given    Person(s) Educated  Patient    Methods  Demonstration;Explanation;Tactile cues;Verbal cues;Handout    Comprehension  Verbalized understanding;Returned demonstration;Verbal cues required          OT Long Term Goals - 02/20/19 1650      OT LONG TERM GOAL #1   Title  Pt independent with lymphedema precautions and prevention strategies to limit LE progression.    Baseline  Max A    Time  4    Period  Days    Status  New    Target Date  --   4th OT Rx visit     OT LONG TERM GOAL #2   Title  Pt will be independent with lymphedema self-care home program and AWS ther ex to improve functional performance of basic and instrumental ADLs by DC.    Baseline  Max A    Time  12  Period  Weeks    Status  New    Target Date  05/20/19   4th OT Rx visit     OT LONG TERM GOAL #3   Title  Pt wil report 0/10 pain with RUE functional use ( reaching, lifting, carrying, dressing, dressing, etc) to achieve independent , pain free ADLs performance.    Baseline  Max A    Time  12    Period  Weeks    Status  New    Target Date  05/20/19            Plan - 03/20/19 1505    Clinical Impression Statement  R axilla appears less inflamed and is reportedly less painful again today. Pt states she is feeling better overall in the pst few days and has increased her activity level mildly. Pt tolerated MLD to RUE/RUQ at beginning and end of session sandwhiched between myofacial release distal to axilla   for cord release. Cords in axilla nd antecubital fossa or now clearly visible and palpable. Pt tolerated all manual therapies without increased pain and she was able to perform all  stretching/ theresx for increased pain free shoulder AROM by end of session. Cont as per POC.    OT  Occupational Profile and History  Comprehensive Assessment- Review of records and extensive additional review of physical, cognitive, psychosocial history related to current functional performance    Occupational performance deficits (Please refer to evaluation for details):  ADL's;IADL's;Social Participation;Leisure;Rest and Sleep;Work    Marketing executive / Function / Physical Skills  ADL;Decreased knowledge of precautions;ROM;UE functional use;Decreased knowledge of use of DME;Edema;Skin integrity;Pain;IADL    Rehab Potential  Good    Clinical Decision Making  Several treatment options, min-mod task modification necessary    Comorbidities Affecting Occupational Performance:  Presence of comorbidities impacting occupational performance    Comorbidities impacting occupational performance description:  see SUBJUECTIVE    Modification or Assistance to Complete Evaluation   Min-Moderate modification of tasks or assist with assess necessary to complete eval    OT Frequency  2x / week    OT Duration  12 weeks    OT Treatment/Interventions  Self-care/ADL training;Therapeutic exercise;Manual lymph drainage;Patient/family education;Therapeutic activities;DME and/or AE instruction;Manual Therapy;Other (comment)   consider fitting with prophylactic cmopression arm sleeve   Plan  BUE AROM ther ex to reduce axillary cording- 2 sets of 10 2-3 x daily    Consulted and Agree with Plan of Care  Patient       Patient will benefit from skilled therapeutic intervention in order to improve the following deficits and impairments:   Body Structure / Function / Physical Skills: ADL, Decreased knowledge of precautions, ROM, UE functional use, Decreased knowledge of use of DME, Edema, Skin integrity, Pain, IADL       Visit Diagnosis: 1. Postmastectomy lymphedema syndrome       Problem List Patient Active Problem List   Diagnosis Date Noted  . Postoperative wound infection 03/13/2019  . Seroma of breast  03/11/2019  . Age-related osteoporosis without current pathological fracture 02/27/2019  . Malignant neoplasm of central portion of right breast in female, estrogen receptor positive ([place]) 02/12/2019  . Genetic testing 01/31/2019  . Malignant neoplasm of upper-inner quadrant of right breast in female, estrogen receptor positive ([place]) 01/15/2019  . Long term current use of opiate analgesic 12/02/2018  . Tympanic membrane central perforation, left 11/04/2018  . DDD (degenerative disc disease), lumbosacral 10/10/2018  . Abnormal MRI, lumbar spine (08/29/2018) 10/10/2018  .  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 Lumbar nerve root impingement (Bilateral: L4 & L5) (L>R: L5) 10/10/2018  . Lumbar Spinal instability of L4-5 10/10/2018  . Lumbar facet hypertrophy 10/10/2018  . COPD (chronic obstructive pulmonary disease) with emphysema ([place]) 08/28/2018  . Elevated rheumatoid factor 08/13/2018  . Elevated C-reactive protein (CRP) 08/12/2018  . Elevated sed rate 08/12/2018  . Lumbar facet syndrome (Bilateral) 08/12/2018  . Back pain with left-sided radiculopathy 08/12/2018  . DDD (degenerative disc disease), lumbar 08/12/2018  . Chronic lower extremity pain (Primary Area of Pain) (Bilateral) (L>R) 07/23/2018  . Chronic low back pain (Secondary Area of Pain) (Bilateral) (L>R) w/ sciatica (Bilateral) 07/23/2018  . Chronic pain syndrome 07/23/2018  . Pharmacologic therapy 07/23/2018  . Disorder of skeletal system 07/23/2018  . Problems influencing health status 07/23/2018  . Chronic sacroiliac joint pain [facility] Area of Pain) (Bilateral) (L>R) 07/23/2018  . Status post lumboperitoneal shunt placement 02/26/2018  . 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 Tympanic membrane perforation 05/30/2016  . PMB (postmenopausal bleeding) 04/26/2016  . Rash 12/27/2015  . Intervertebral disc stenosis of neural canal of lumbar  region 05/26/2015  . Plantar fasciitis of foot (Left) 05/26/2015  . Spondylosis of lumbar region without myelopathy or radiculopathy 05/26/2015  . Lumbar foraminal stenosis (L3-4, L4-5, L5-S1) (L>R) 05/26/2015  . Spinal stenosis of lumbar region with neurogenic claudication 05/26/2015  . Encounter for screening mammogram for malignant neoplasm of breast 12/02/2014  . Goiter 12/02/2014  . Chronic idiopathic gout of foot (Right) 07/06/2014  . Biceps tendonitis (Right) 05/19/2014  . Shoulder pain (Right) 05/19/2014  . Facial weakness 05/05/2014  . Post herpetic neuralgia 05/05/2014  . Chronic dysfunction of left eustachian tube 03/24/2014  . Osteopenia 03/13/2014  . Lumbar Grade 1 Anterolisthesis of L3/4 (39mm) & L4/5 (4-33mm)(w/ Dynamic instability) 03/13/2014  . Family history of colon cancer 12/02/2013  . Dyshidrotic eczema 11/17/2013  . Diastasis recti 04/08/2013  . Facial nerve paresis 07/31/2012  . Blepharospasm 05/08/2012  . 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 Obesity 03/28/2012  . Hemoptysis 12/22/2011  . Neuropathic postherpetic trigeminal neuralgia 12/12/2011  . Allergic rhinitis, mild 10/12/2011  . Disequilibrium syndrome 09/27/2011  . Chronic bronchitis ([place]) 06/05/2011  . Essential hypertension 05/24/2011  . Type 2 diabetes mellitus without complications ([place]) 61/44/3154  . Pulmonary nodules 05/05/2011    Andrey Spearman, MS, OTR/L, [facility] 03/20/19 3:10 PM   [place] MAIN [facility] SERVICES [address] [place], [place], [address] Phone: [phone]   Fax:  [phone]  Name: Susan Callahan MRN: [id number] Date of Birth: [birth year]

## 2019-03-24 ENCOUNTER — Ambulatory Visit: Payer: Medicare Other

## 2019-03-24 ENCOUNTER — Other Ambulatory Visit: Payer: Self-pay

## 2019-03-24 ENCOUNTER — Ambulatory Visit: Payer: Medicare Other | Admitting: Occupational Therapy

## 2019-03-24 DIAGNOSIS — R2689 Other abnormalities of gait and mobility: Secondary | ICD-10-CM

## 2019-03-24 DIAGNOSIS — M545 Low back pain: Secondary | ICD-10-CM | POA: Diagnosis not present

## 2019-03-24 DIAGNOSIS — I972 Postmastectomy lymphedema syndrome: Secondary | ICD-10-CM

## 2019-03-24 DIAGNOSIS — R293 Abnormal posture: Secondary | ICD-10-CM

## 2019-03-24 DIAGNOSIS — G8929 Other chronic pain: Secondary | ICD-10-CM

## 2019-03-24 DIAGNOSIS — M6281 Muscle weakness (generalized): Secondary | ICD-10-CM

## 2019-03-24 NOTE — Therapy (Signed)
[place] MAIN [facility] SERVICES [address] [place], [place], [address] Phone: [phone]   Fax:  [phone]  Occupational Therapy Treatment  Patient Details  Name: Susan Callahan MRN: [id number] Date of Birth: 12-Feb-1951 Referring Provider (OT): Humberto Leep, MD   Encounter Date: 03/24/2019  OT End of Session - 03/24/19 1621    Visit Number  6    Number of Visits  36    Date for OT Re-Evaluation  05/20/19    OT Start Time  0320    OT Stop Time  0415    OT Time Calculation (min)  55 min    Activity Tolerance  Patient tolerated treatment well;No increased pain    Behavior During Therapy  WFL for tasks assessed/performed       Past Medical History:  Diagnosis Date  . COPD (chronic obstructive pulmonary disease) ([place])   . Depression 1974  . Diabetes mellitus without complication ([place])   . Hypertension   . Migraine   . 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 LUMBAR PERITONEAL SHUNT    . TYMPANOSTOMY TUBE PLACEMENT      There were no vitals filed for this visit.  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 Pt reports, I feel a twinge in my R arm pit when I drives."    Pain Onset  1 to 4 weeks ago    Pain Onset  More than a month ago                   OT Treatments/Exercises (OP) - 03/24/19 0001      ADLs   ADL Education Given  Yes      Manual Therapy   Manual Therapy  Manual Lymphatic Drainage (MLD);Myofascial release;Edema management    Myofascial Release  R volar forearm and arm as established.    Manual Lymphatic Drainage (MLD)  MLD to RUE/ RUQ  utilizing ipsilateral axillo-inguinal watershed, and anterior and posterior axillary anastamoses. Pt tolerated well in supine and sidelying. No myofacial release today.             OT Education -  03/24/19 1620    Education Details  Pt edu for "unwinding " stretch in sidelying to improve  pain free AROM and to elongate soft tissues in the RUE and upper quadrant. good return    Person(s) Educated  Patient    Methods  Demonstration;Explanation;Tactile cues;Verbal cues;Handout    Comprehension  Verbalized understanding;Returned demonstration          OT Long Term Goals - 02/20/19 1650      OT LONG TERM GOAL #1   Title  Pt independent with lymphedema precautions and prevention strategies to limit LE progression.    Baseline  Max A    Time  4    Period  Days    Status  New    Target Date  --   4th OT Rx visit     OT LONG TERM GOAL #2   Title  Pt will be independent with lymphedema self-care home program and AWS ther ex to improve functional performance of basic and instrumental ADLs by DC.    Baseline  Max A    Time  12    Period  Weeks    Status  New  Target Date  05/20/19   4th OT Rx visit     OT LONG TERM GOAL #3   Title  Pt wil report 0/10 pain with RUE functional use ( reaching, lifting, carrying, dressing, dressing, etc) to achieve independent , pain free ADLs performance.    Baseline  Max A    Time  12    Period  Weeks    Status  New    Target Date  05/20/19            Plan - 03/24/19 1742    Clinical Impression Statement  Able to break several  painfullt entrapped cords originating in R axilla 2/2 LND sx today with Pt reporting increasing releif. Pt able to demonstrate full R shoulder AROM by end of session with mild pain only at end range abduction and flexion with elbow and wrist extension. Cont as per POC.    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  ADL;Decreased knowledge of precautions;ROM;UE functional use;Decreased knowledge of use of DME;Edema;Skin integrity;Pain;IADL    Rehab Potential  Good    Clinical Decision Making  Several treatment options, min-mod task modification necessary    Comorbidities Affecting Occupational Performance:  Presence of comorbidities impacting occupational performance    Comorbidities impacting occupational performance description:  see SUBJUECTIVE    Modification or Assistance to Complete Evaluation   Min-Moderate modification of tasks or assist with assess necessary to complete eval    OT Frequency  2x / week    OT Duration  12 weeks    OT Treatment/Interventions  Self-care/ADL training;Therapeutic exercise;Manual lymph drainage;Patient/family education;Therapeutic activities;DME and/or AE instruction;Manual Therapy;Other (comment)   consider fitting with prophylactic cmopression arm sleeve   Plan  BUE AROM ther ex to reduce axillary cording- 2 sets of 10 2-3 x daily    Consulted and Agree with Plan of Care  Patient       Patient will benefit from skilled therapeutic intervention in order to improve the following deficits and impairments:   Body Structure / Function / Physical Skills: ADL, Decreased knowledge of precautions, ROM, UE functional use, Decreased knowledge of use of DME, Edema, Skin integrity, Pain, IADL       Visit Diagnosis: 1. Postmastectomy lymphedema syndrome       Problem List Patient Active Problem List   Diagnosis Date Noted  . Postoperative wound infection 03/13/2019  . Seroma of breast 03/11/2019  . Age-related osteoporosis without current pathological fracture 02/27/2019  . Malignant neoplasm of central portion of right breast in female, estrogen receptor positive ([place]) 02/12/2019  . Genetic testing 01/31/2019  . Malignant neoplasm of upper-inner quadrant of right breast in female, estrogen receptor positive ([place]) 01/15/2019  .  Long term current use of opiate analgesic 12/02/2018  . Tympanic membrane central perforation, left 11/04/2018  . DDD (degenerative disc disease), lumbosacral 10/10/2018  . Abnormal MRI, lumbar spine (08/29/2018) 10/10/2018  . 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 Lumbar nerve root impingement (Bilateral: L4 & L5) (L>R: L5) 10/10/2018  . Lumbar Spinal instability of L4-5 10/10/2018  . Lumbar facet hypertrophy 10/10/2018  . COPD (chronic obstructive pulmonary disease) with emphysema ([place]) 08/28/2018  . Elevated rheumatoid factor 08/13/2018  . Elevated  C-reactive protein (CRP) 08/12/2018  . Elevated sed rate 08/12/2018  . Lumbar facet syndrome (Bilateral) 08/12/2018  . Back pain with left-sided radiculopathy 08/12/2018  . DDD (degenerative disc disease), lumbar 08/12/2018  . Chronic lower extremity pain (Primary Area of Pain) (Bilateral) (L>R) 07/23/2018  . Chronic low back pain (Secondary Area of Pain) (Bilateral) (L>R) w/ sciatica (Bilateral) 07/23/2018  . Chronic pain syndrome 07/23/2018  . Pharmacologic therapy 07/23/2018  . Disorder of skeletal system 07/23/2018  . Problems influencing health status 07/23/2018  . Chronic sacroiliac joint pain [facility] Area of Pain) (Bilateral) (L>R) 07/23/2018  . Status post lumboperitoneal shunt placement 02/26/2018  . 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 Tympanic membrane perforation 05/30/2016  . PMB (postmenopausal bleeding) 04/26/2016  . Rash 12/27/2015  . 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 Plantar fasciitis of foot (Left) 05/26/2015  . Spondylosis of lumbar region without myelopathy or radiculopathy 05/26/2015  . Lumbar foraminal stenosis (L3-4, L4-5, L5-S1) (L>R) 05/26/2015  . Spinal stenosis of lumbar region with neurogenic claudication 05/26/2015  . Encounter for screening mammogram for malignant neoplasm of breast  12/02/2014  . Goiter 12/02/2014  . Chronic idiopathic gout of foot (Right) 07/06/2014  . Biceps tendonitis (Right) 05/19/2014  . Shoulder pain (Right) 05/19/2014  . Facial weakness 05/05/2014  . Post herpetic neuralgia 05/05/2014  . Chronic dysfunction of left eustachian tube 03/24/2014  . Osteopenia 03/13/2014  . Lumbar Grade 1 Anterolisthesis of L3/4 (48mm) & L4/5 (4-44mm)(w/ Dynamic instability) 03/13/2014  . Family history of colon cancer 12/02/2013  . Dyshidrotic eczema 11/17/2013  . Diastasis recti 04/08/2013  . Facial nerve paresis 07/31/2012  . Blepharospasm 05/08/2012  . 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 Obesity 03/28/2012  . Hemoptysis 12/22/2011  . Neuropathic postherpetic trigeminal neuralgia 12/12/2011  . Allergic rhinitis, mild 10/12/2011  . Disequilibrium syndrome 09/27/2011  . Chronic bronchitis ([place]) 06/05/2011  . Essential hypertension 05/24/2011  . Type 2 diabetes mellitus without complications ([place]) 38/32/9191  . Pulmonary nodules 05/05/2011     Andrey Spearman, MS, OTR/L, [facility] 03/24/19 5:45 PM   [place] MAIN [facility] SERVICES [address] [place], [place], [address] Phone: [phone]   Fax:  [phone]  Name: Krysten Veronica MRN: [id number] Date of Birth: 03/14/1951

## 2019-03-24 NOTE — Therapy (Signed)
[place] MAIN [facility] SERVICES [address] [place], [place], [address] Phone: [phone]   Fax:  [phone]  Physical Therapy Treatment Physical Therapy Progress Note   Dates of reporting period  11/21/18  to  03/24/19   Patient Details  Name: Susan Callahan MRN: [id number] Date of Birth: Aug 09, 1951 Referring Provider (PT): Francesco Sor   Encounter Date: 03/24/2019  PT End of Session - 03/24/19 1422    Visit Number  40    Number of Visits  46    Date for PT Re-Evaluation  04/14/19    Authorization Type  10/10 starting 11/21/2018 next one 1/10 PN on 03/24/19    PT Start Time  1426    PT Stop Time  1510    PT Time Calculation (min)  44 min    Equipment Utilized During Treatment  Gait belt    Activity Tolerance  Patient tolerated treatment well;Patient limited by pain    Behavior During Therapy  WFL for tasks assessed/performed       Past Medical History:  Diagnosis Date  . COPD (chronic obstructive pulmonary disease) ([place])   . Depression 1974  . Diabetes mellitus without complication ([place])   . Hypertension   . Migraine   . 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 LUMBAR PERITONEAL SHUNT    . TYMPANOSTOMY TUBE PLACEMENT      There were no vitals filed for this visit.  Subjective Assessment - 03/24/19 1439    Subjective  Patient reports she continues to be able to perform more activities at home with less back pain. Is compliant with HEP with no falls or LOB since last session.    Pertinent History  Pain began in 2007 and has gradually got worse and has progressed to nonstop. Patient states pain last all day long, everyday with no change in symptoms. Medication helps briefly. States she has difficulty with showering and standing. States she has to stand with her knee bent due to pulling sensation in legs. Pain begins immediately when stands and begins to walk. Pain feels  better when she bends her knees when standing. Pain became constant and non stop around Dec 2018. Denies bowel or bladder changes, unexplained weakness, or unexpected weight loss/gain. She currently goes to pulmonary rehab 3x a week, but has increased pain with treadmill walking. Will walk with cane on occasion around house especially at night due to occasional dizziness and vision impairments in medical history. States she also has difficulty with carrying groceries up steps due to low back pain. She would like to be able to walk better and have less pain to be less reliant on medication.     Currently in Pain?  Yes    Pain Score  3     Pain Location  Back    Pain Orientation  Lower    Pain Descriptors / Indicators  Aching    Pain Type  Chronic pain    Pain Onset  More than a month ago    Pain Frequency  Intermittent    Pain Onset  More than a month ago            Patient's goals evaluated on 03/17/19, please refer to this note for further details. Patient continues to progress with mobility, decreasing low back pain with improved capacity for functional activities.    Manual Therapy    Hamstring stretch in supine with increasing  range 60 seconds each LE Roller to lumbar and thoracic paraspinals x 3 minutes.   SAD in figure 4 stretch with belt LLE 5x 20 seconds inferior direction  roller to L piriformis, quadratus lumborum/paraspinals, and calf musculature x 3 minutes  Ther-ex: cueing for sequencing, positioning and muscle activation Swiss ball forward trunk lean for spinal opening 10x forward, 5x to each angle with 10 second holds.  Posterior pelvic tilts x15. tactile and verbal cue for appropriate technique 5 seconds with hold, improved alignment  seated piriformis stretch both ways 60 seconds holds x2     Stair negotiation for limited eye sight: ascending with widened BOS, TrA activation ensuring full foot placement prior to stepping. Descending with TrA activation , sliding heel  down step until touching step.  x2 trials  Modified eccentric step down 10x RLE to increase L gluteal strength utilizing chair as support    Ambulate length of gym with focus on heel strike of LLE, 4x with increasing gait mechanics with repetition, additional TrA contraction with ambulation performed with improved gait mechanics and decreased discomfort with ambulation     Pt educated throughout session about proper posture and technique with exercises. Improved exercise technique, movement at target joints, use of target muscles after min to mod verbal, visual, tactile cues     Patient's condition has the potential to improve in response to therapy. Maximum improvement is yet to be obtained. The anticipated improvement is attainable and reasonable in a generally predictable time.  Patient reports she is progressing with decreased pain with daily activities.                      PT Education - 03/24/19 1421    Education Details  exercise technique, manual, stair negotiation    Person(s) Educated  Patient    Methods  Explanation;Demonstration;Tactile cues;Verbal cues    Comprehension  Verbalized understanding;Returned demonstration;Verbal cues required;Tactile cues required       PT Short Term Goals - 03/17/19 1447      PT SHORT TERM GOAL #1   Title  Patient will be independent with completion of HEP to improve ability to complete functional tasks and functional ADLs.    Baseline  10/23: will give HEP next session 11/19: HEP compliance    Time  2    Period  Weeks    Status  Achieved      PT SHORT TERM GOAL #2   Title  Patient will report pain score less than 4/10 on VAS to demonstrate reduction of pain levels with functional tasks.     Baseline  10/23: 6/10 11/19: 5/10 12/12: 9/10 1/10: 7/10  2/11: 3/10     Time  2    Period  Weeks    Status  Achieved        PT Long Term Goals - 03/17/19 1447      PT LONG TERM GOAL #1   Title  Patient will report pain  score of <3/10 on VAS for decreased pain levels and ease with functional activities.    Baseline  10/23: 6/10 11/19: 5/10 12/12 9/10 1/10: 7/10 2/11: 3/10 3/9: 6/10  5/26: 8/10 03/17/19: 7/10    Time  4    Period  Weeks    Status  Partially Met    Target Date  04/14/19      PT LONG TERM GOAL #2   Title  Patient will be able to ambulate on treadmill for 76mnutes without increase in  back/leg pain demonstrating improved community ambulation and treadmill walking at pulmonary rehab    Baseline  10/23: 36mnutes increases pain 11/19: 820mutes 12/12: has not had the chance to perform 1/10: was able to do 5 minutes without pain 2/11: able to do 5 minutes then had to bend over 3/9: 5 minutes 30 seconds at 2.0 mphs 5/26: had to walk 10 minutes at cancer center with extreme pain but was able to perform 7/6: walks 10 minutes with increase of pain by 2 points    Time  4    Period  Weeks    Status  Partially Met    Target Date  04/14/19      PT LONG TERM GOAL #3   Title  Patient will have decreased modified oswestry score <50% for improved ability to complete household tasks with less pain levels.     Baseline  10/23: 62% 11/19: 42%      Time  4    Period  Weeks    Status  Achieved      PT LONG TERM GOAL #4   Title  Patient will score 4+/5 on BLE MMT to demonstrate improved glute strength and ability to carry groceries with increased ease up/down stairs at home.    Baseline  10/23: 4-/5 grossly 11/19: 4/5 grossly; hamstrings 4-/5 12/12: 4/5 gross 1/10: 4/5 gross 2/11: gross 4+/5 with hamstrings and abuductors 4-/5 3/9: gross 4+/5 hamstrings and abductors 4-/5 5/27: grossly 3+/5 with pain with LLE movements 7/6: grossly 4-/5 with pain with LLE movement    Time  4    Period  Weeks    Status  On-going    Target Date  04/14/19      PT LONG TERM GOAL #5   Title  Patient will have decreased modified oswestry score <35% for improved ability to complete household tasks with less pain levels.     Baseline   11/19: 42% 12/12: 52% 1/10: 48%  2/11: 30%     Time  4    Period  Weeks    Status  Achieved      PT LONG TERM GOAL #6   Title  Patient will be able to make and/or strip the bed without pain and without needing rest breaks to perform iADLs independently.     Baseline  2/11: pain limits ability to perform, ~4 breaks required 3/9: takes her time, 3 breaks 5/27: very painful still 7/6: requires only one rest break    Time  4    Period  Weeks    Status  Partially Met    Target Date  04/14/19      PT LONG TERM GOAL #7   Title  Patient will have decreased modified oswestry score <20% for improved ability to complete household tasks with less pain levels.     Baseline  2/11: 30%  3/9: 30% 5/27: 63% 7/6: 38%    Time  4    Period  Weeks    Status  On-going    Target Date  04/14/19            Plan - 03/24/19 1509    Clinical Impression Statement  Patient's goals evaluated on 03/17/19, please refer to this note for further details. Patient continues to progress with mobility, decreasing low back pain with improved capacity for functional activities. Patient's condition has the potential to improve in response to therapy. Maximum improvement is yet to be obtained. The anticipated improvement is attainable and reasonable in a  generally predictable time.  Patient progressing with stair negotiation however is challenged with descending due to LLE weakness stemming from low back pain/radiating symptoms. Patient will continue to benefit from skilled physical therapy to improve strength, to reduce pain levels with ADLs, and to improve quality of life.    Rehab Potential  Fair    Clinical Impairments Affecting Rehab Potential  (+) motivation (-) chronicity of symptoms    PT Frequency  2x / week    PT Duration  4 weeks    PT Treatment/Interventions  ADLs/Self Care Home Management;Cryotherapy;Moist Heat;Traction;Iontophoresis 64m/ml Dexamethasone;Ultrasound;Gait training;Stair training;Functional mobility  training;Neuromuscular re-education;Balance training;Therapeutic exercise;Therapeutic activities;Patient/family education;Manual techniques;Energy conservation;Aquatic Therapy;Electrical Stimulation;Passive range of motion;Dry needling;Taping    PT Next Visit Plan  cross body coordination, progress core/postural stability, progress BLE strengthening    PT Home Exercise Plan  No updates this day    Consulted and Agree with Plan of Care  Patient       Patient will benefit from skilled therapeutic intervention in order to improve the following deficits and impairments:  Abnormal gait, Cardiopulmonary status limiting activity, Decreased activity tolerance, Decreased balance, Decreased endurance, Decreased range of motion, Difficulty walking, Decreased strength, Hypomobility, Decreased mobility, Impaired perceived functional ability, Impaired flexibility, Postural dysfunction, Obesity, Improper body mechanics, Pain  Visit Diagnosis: 1. Chronic low back pain, unspecified back pain laterality, unspecified whether sciatica present   2. Abnormal posture   3. Muscle weakness (generalized)   4. Other abnormalities of gait and mobility        Problem List Patient Active Problem List   Diagnosis Date Noted  . Postoperative wound infection 03/13/2019  . Seroma of breast 03/11/2019  . Age-related osteoporosis without current pathological fracture 02/27/2019  . Malignant neoplasm of central portion of right breast in female, estrogen receptor positive ([place] 02/12/2019  . Genetic testing 01/31/2019  . Malignant neoplasm of upper-inner quadrant of right breast in female, estrogen receptor positive ([place] 01/15/2019  . Long term current use of opiate analgesic 12/02/2018  . Tympanic membrane central perforation, left 11/04/2018  . DDD (degenerative disc disease), lumbosacral 10/10/2018  . Abnormal MRI, lumbar spine (08/29/2018) 10/10/2018  . 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 Lumbar nerve root impingement (Bilateral: L4 & L5) (L>R: L5) 10/10/2018  . Lumbar Spinal instability of L4-5 10/10/2018  . Lumbar facet hypertrophy 10/10/2018  . COPD (chronic obstructive pulmonary disease) with emphysema ([place] 08/28/2018  . Elevated rheumatoid factor 08/13/2018  . Elevated C-reactive protein (CRP) 08/12/2018  . Elevated sed rate 08/12/2018  . Lumbar facet syndrome (Bilateral) 08/12/2018  . Back pain with left-sided radiculopathy 08/12/2018  . DDD (degenerative disc disease), lumbar 08/12/2018  . Chronic lower extremity pain (Primary Area of Pain) (Bilateral) (L>R) 07/23/2018  . Chronic low back pain (Secondary Area of Pain) (Bilateral) (L>R) w/ sciatica (Bilateral) 07/23/2018  . Chronic pain syndrome 07/23/2018  . Pharmacologic therapy 07/23/2018  . Disorder of skeletal system 07/23/2018  . Problems influencing health status 07/23/2018  . Chronic sacroiliac joint pain ([facility]Area of Pain) (Bilateral) (L>R) 07/23/2018  . Status post lumboperitoneal shunt placement 02/26/2018  . 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 Tympanic membrane perforation 05/30/2016  . PMB (postmenopausal bleeding) 04/26/2016  . Rash 12/27/2015  . 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 Plantar fasciitis of foot (Left) 05/26/2015  . Spondylosis of lumbar region without myelopathy or radiculopathy 05/26/2015  . Lumbar foraminal stenosis (L3-4, L4-5,  L5-S1) (L>R) 05/26/2015  . Spinal stenosis of lumbar region with neurogenic claudication 05/26/2015  . Encounter for screening mammogram for malignant neoplasm of breast 12/02/2014  . Goiter 12/02/2014  . Chronic idiopathic gout of foot (Right) 07/06/2014  . Biceps tendonitis (Right) 05/19/2014  . Shoulder pain (Right) 05/19/2014  . Facial weakness 05/05/2014  . Post herpetic neuralgia 05/05/2014  . Chronic dysfunction of left eustachian tube  03/24/2014  . Osteopenia 03/13/2014  . Lumbar Grade 1 Anterolisthesis of L3/4 (68m) & L4/5 (4-127m(w/ Dynamic instability) 03/13/2014  . Family history of colon cancer 12/02/2013  . Dyshidrotic eczema 11/17/2013  . Diastasis recti 04/08/2013  . Facial nerve paresis 07/31/2012  . Blepharospasm 05/08/2012  . 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 Obesity 03/28/2012  . Hemoptysis 12/22/2011  . Neuropathic postherpetic trigeminal neuralgia 12/12/2011  . Allergic rhinitis, mild 10/12/2011  . Disequilibrium syndrome 09/27/2011  . Chronic bronchitis ([place]09/24/2012  . Essential hypertension 05/24/2011  . Type 2 diabetes mellitus without complications ([id number]. Pulmonary nodules 05/05/2011   MaJanna ArchPT, DPT   03/24/2019, 3:11 PM  [facility] [facility] [address] [place]: [phone] Fax:  [phone]: LiRaelan BurgoonRN: [id number] of Birth: 02/13/23/1952

## 2019-03-26 ENCOUNTER — Other Ambulatory Visit: Payer: Self-pay

## 2019-03-26 ENCOUNTER — Ambulatory Visit: Payer: Medicare Other

## 2019-03-26 DIAGNOSIS — M6281 Muscle weakness (generalized): Secondary | ICD-10-CM

## 2019-03-26 DIAGNOSIS — M545 Low back pain, unspecified: Secondary | ICD-10-CM

## 2019-03-26 DIAGNOSIS — G8929 Other chronic pain: Secondary | ICD-10-CM

## 2019-03-26 DIAGNOSIS — R293 Abnormal posture: Secondary | ICD-10-CM

## 2019-03-26 NOTE — Therapy (Signed)
[place] MAIN [facility] SERVICES [address] [place], [place], [address] Phone: [phone]   Fax:  [phone]  Physical Therapy Treatment  Patient Details  Name: Susan Callahan MRN: [id number] Date of Birth: 11-12-1950 Referring Provider (PT): Francesco Sor   Encounter Date: 03/26/2019  PT End of Session - 03/26/19 1538    Visit Number  41    Number of Visits  46    Date for PT Re-Evaluation  04/14/19    Authorization Type  1/10 PN on 03/24/19    PT Start Time  1515    PT Stop Time  1559    PT Time Calculation (min)  44 min    Equipment Utilized During Treatment  Gait belt    Activity Tolerance  Patient tolerated treatment well;Patient limited by pain    Behavior During Therapy  [facility] for tasks assessed/performed       Past Medical History:  Diagnosis Date  . COPD (chronic obstructive pulmonary disease) ([place])   . Depression 1974  . Diabetes mellitus without complication ([place])   . Hypertension   . Migraine   . 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 LUMBAR PERITONEAL SHUNT    . TYMPANOSTOMY TUBE PLACEMENT      There were no vitals filed for this visit.  Subjective Assessment - 03/26/19 1519    Subjective  Patient reports her back started hurting after doing some watering in her yard today. Has been compliant with HEP.    Pertinent History  Pain began in 2007 and has gradually got worse and has progressed to nonstop. Patient states pain last all day long, everyday with no change in symptoms. Medication helps briefly. States she has difficulty with showering and standing. States she has to stand with her knee bent due to pulling sensation in legs. Pain begins immediately when stands and begins to walk. Pain feels better when she bends her knees when standing. Pain became constant and non stop around Dec 2018. Denies bowel or bladder changes, unexplained weakness, or unexpected  weight loss/gain. She currently goes to pulmonary rehab 3x a week, but has increased pain with treadmill walking. Will walk with cane on occasion around house especially at night due to occasional dizziness and vision impairments in medical history. States she also has difficulty with carrying groceries up steps due to low back pain. She would like to be able to walk better and have less pain to be less reliant on medication.     Currently in Pain?  Yes    Pain Score  4     Pain Location  Back    Pain Orientation  Lower    Pain Descriptors / Indicators  Aching    Pain Type  Chronic pain    Pain Onset  More than a month ago    Pain Frequency  Intermittent           Manual Therapy  L/R single knee to chest stretch x 30s each LE; Hamstring stretch in supine with increasing range 60 seconds each LE Piriformis stretch R and L 30 second holds  Roller to lumbar and thoracic paraspinals x 3 minutes.   SAD in figure 4 position with belt BLE 5x 20 seconds inferior direction Calf rollout with "the stick" 2 minutes each calf Hamstring rollout with " the stick" 2 minutes each calf    Ther-ex: cueing for sequencing, positioning and muscle activation  Hooklying HS stretch with DF/PF for sciatic nerve mobility x20 bilateral; Posterior pelvic tilts x15. tactile and verbal cue for appropriate technique 5 seconds with hold, improved alignment  posterior pelvic tilt with adduction squeeze in hooklying 15x 3 second holds   seated piriformis stretch 30 seconds each LE  Ambulate length of gym with focus on heel strike of LLE, 4x with increasing gait mechanics with repetition, additional TrA contraction with ambulation performed with improved gait mechanics and decreased discomfort with ambulation    Pt educated throughout session about proper posture and technique with exercises. Improved exercise technique, movement at target joints, use of target muscles after min to mod verbal, visual, tactile cues.                    PT Education - 03/26/19 1538    Education Details  exercise technique, manual,    Person(s) Educated  Patient    Methods  Explanation;Demonstration;Tactile cues;Verbal cues    Comprehension  Verbalized understanding;Returned demonstration;Verbal cues required;Tactile cues required       PT Short Term Goals - 03/17/19 1447      PT SHORT TERM GOAL #1   Title  Patient will be independent with completion of HEP to improve ability to complete functional tasks and functional ADLs.    Baseline  10/23: will give HEP next session 11/19: HEP compliance    Time  2    Period  Weeks    Status  Achieved      PT SHORT TERM GOAL #2   Title  Patient will report pain score less than 4/10 on VAS to demonstrate reduction of pain levels with functional tasks.     Baseline  10/23: 6/10 11/19: 5/10 12/12: 9/10 1/10: 7/10  2/11: 3/10     Time  2    Period  Weeks    Status  Achieved        PT Long Term Goals - 03/17/19 1447      PT LONG TERM GOAL #1   Title  Patient will report pain score of <3/10 on VAS for decreased pain levels and ease with functional activities.    Baseline  10/23: 6/10 11/19: 5/10 12/12 9/10 1/10: 7/10 2/11: 3/10 3/9: 6/10  5/26: 8/10 03/17/19: 7/10    Time  4    Period  Weeks    Status  Partially Met    Target Date  04/14/19      PT LONG TERM GOAL #2   Title  Patient will be able to ambulate on treadmill for 84mnutes without increase in back/leg pain demonstrating improved community ambulation and treadmill walking at pulmonary rehab    Baseline  10/23: 060mutes increases pain 11/19: 10m20mtes 12/12: has not had the chance to perform 1/10: was able to do 5 minutes without pain 2/11: able to do 5 minutes then had to bend over 3/9: 5 minutes 30 seconds at 2.0 mphs 5/26: had to walk 10 minutes at cancer center with extreme pain but was able to perform 7/6: walks 10 minutes with increase of pain by 2 points    Time  4    Period  Weeks    Status   Partially Met    Target Date  04/14/19      PT LONG TERM GOAL #3   Title  Patient will have decreased modified oswestry score <50% for improved ability to complete household tasks with less pain levels.     Baseline  10/23: 62% 11/19:  42%      Time  4    Period  Weeks    Status  Achieved      PT LONG TERM GOAL #4   Title  Patient will score 4+/5 on BLE MMT to demonstrate improved glute strength and ability to carry groceries with increased ease up/down stairs at home.    Baseline  10/23: 4-/5 grossly 11/19: 4/5 grossly; hamstrings 4-/5 12/12: 4/5 gross 1/10: 4/5 gross 2/11: gross 4+/5 with hamstrings and abuductors 4-/5 3/9: gross 4+/5 hamstrings and abductors 4-/5 5/27: grossly 3+/5 with pain with LLE movements 7/6: grossly 4-/5 with pain with LLE movement    Time  4    Period  Weeks    Status  On-going    Target Date  04/14/19      PT LONG TERM GOAL #5   Title  Patient will have decreased modified oswestry score <35% for improved ability to complete household tasks with less pain levels.     Baseline  11/19: 42% 12/12: 52% 1/10: 48%  2/11: 30%     Time  4    Period  Weeks    Status  Achieved      PT LONG TERM GOAL #6   Title  Patient will be able to make and/or strip the bed without pain and without needing rest breaks to perform iADLs independently.     Baseline  2/11: pain limits ability to perform, ~4 breaks required 3/9: takes her time, 3 breaks 5/27: very painful still 7/6: requires only one rest break    Time  4    Period  Weeks    Status  Partially Met    Target Date  04/14/19      PT LONG TERM GOAL #7   Title  Patient will have decreased modified oswestry score <20% for improved ability to complete household tasks with less pain levels.     Baseline  2/11: 30%  3/9: 30% 5/27: 63% 7/6: 38%    Time  4    Period  Weeks    Status  On-going    Target Date  04/14/19            Plan - 03/26/19 1539    Clinical Impression Statement  Patient presents with increased  aggravation of back pain, however in her right side and left rather than solely on the left. Patient's pain reduced via manual and therex resulting in improved gait mechanics at end of session. Patient will continue to benefit from skilled physical therapy to improve strength, to reduce pain levels with ADLs, and to improve quality of life.    Rehab Potential  Fair    Clinical Impairments Affecting Rehab Potential  (+) motivation (-) chronicity of symptoms    PT Frequency  2x / week    PT Duration  4 weeks    PT Treatment/Interventions  ADLs/Self Care Home Management;Cryotherapy;Moist Heat;Traction;Iontophoresis 27m/ml Dexamethasone;Ultrasound;Gait training;Stair training;Functional mobility training;Neuromuscular re-education;Balance training;Therapeutic exercise;Therapeutic activities;Patient/family education;Manual techniques;Energy conservation;Aquatic Therapy;Electrical Stimulation;Passive range of motion;Dry needling;Taping    PT Next Visit Plan  cross body coordination, progress core/postural stability, progress BLE strengthening    PT Home Exercise Plan  No updates this day    Consulted and Agree with Plan of Care  Patient       Patient will benefit from skilled therapeutic intervention in order to improve the following deficits and impairments:  Abnormal gait, Cardiopulmonary status limiting activity, Decreased activity tolerance, Decreased balance, Decreased endurance, Decreased range of motion,  Difficulty walking, Decreased strength, Hypomobility, Decreased mobility, Impaired perceived functional ability, Impaired flexibility, Postural dysfunction, Obesity, Improper body mechanics, Pain  Visit Diagnosis: 1. Chronic low back pain, unspecified back pain laterality, unspecified whether sciatica present   2. Abnormal posture   3. Muscle weakness (generalized)        Problem List Patient Active Problem List   Diagnosis Date Noted  . Postoperative wound infection 03/13/2019  . Seroma  of breast 03/11/2019  . Age-related osteoporosis without current pathological fracture 02/27/2019  . Malignant neoplasm of central portion of right breast in female, estrogen receptor positive ([place]) 02/12/2019  . Genetic testing 01/31/2019  . Malignant neoplasm of upper-inner quadrant of right breast in female, estrogen receptor positive ([place]) 01/15/2019  . Long term current use of opiate analgesic 12/02/2018  . Tympanic membrane central perforation, left 11/04/2018  . DDD (degenerative disc disease), lumbosacral 10/10/2018  . Abnormal MRI, lumbar spine (08/29/2018) 10/10/2018  . 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 Lumbar nerve root impingement (Bilateral: L4 & L5) (L>R: L5) 10/10/2018  . Lumbar Spinal instability of L4-5 10/10/2018  . Lumbar facet hypertrophy 10/10/2018  . COPD (chronic obstructive pulmonary disease) with emphysema ([place]) 08/28/2018  . Elevated rheumatoid factor 08/13/2018  . Elevated C-reactive protein (CRP) 08/12/2018  . Elevated sed rate 08/12/2018  . Lumbar facet syndrome (Bilateral) 08/12/2018  . Back pain with left-sided radiculopathy 08/12/2018  . DDD (degenerative disc disease), lumbar 08/12/2018  . Chronic lower extremity pain (Primary Area of Pain) (Bilateral) (L>R) 07/23/2018  . Chronic low back pain (Secondary Area of Pain) (Bilateral) (L>R) w/ sciatica (Bilateral) 07/23/2018  . Chronic pain syndrome 07/23/2018  . Pharmacologic therapy 07/23/2018  . Disorder of skeletal system 07/23/2018  . Problems influencing health status 07/23/2018  . Chronic sacroiliac joint pain [facility] Area of Pain) (Bilateral) (L>R) 07/23/2018  . Status post lumboperitoneal shunt placement 02/26/2018  . 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 Tympanic membrane perforation 05/30/2016  . PMB (postmenopausal bleeding) 04/26/2016  . Rash 12/27/2015  . Intervertebral disc stenosis of neural canal of  lumbar region 05/26/2015  . Plantar fasciitis of foot (Left) 05/26/2015  . Spondylosis of lumbar region without myelopathy or radiculopathy 05/26/2015  . Lumbar foraminal stenosis (L3-4, L4-5, L5-S1) (L>R) 05/26/2015  . Spinal stenosis of lumbar region with neurogenic claudication 05/26/2015  . Encounter for screening mammogram for malignant neoplasm of breast 12/02/2014  . Goiter 12/02/2014  . Chronic idiopathic gout of foot (Right) 07/06/2014  . Biceps tendonitis (Right) 05/19/2014  . Shoulder pain (Right) 05/19/2014  . Facial weakness 05/05/2014  . Post herpetic neuralgia 05/05/2014  . Chronic dysfunction of left eustachian tube 03/24/2014  . Osteopenia 03/13/2014  . Lumbar Grade 1 Anterolisthesis of L3/4 (83m) & L4/5 (4-142m(w/ Dynamic instability) 03/13/2014  . Family history of colon cancer 12/02/2013  . Dyshidrotic eczema 11/17/2013  . Diastasis recti 04/08/2013  . Facial nerve paresis 07/31/2012  . Blepharospasm 05/08/2012  . 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 Obesity 03/28/2012  . Hemoptysis 12/22/2011  . Neuropathic postherpetic trigeminal neuralgia 12/12/2011  . Allergic rhinitis, mild 10/12/2011  . Disequilibrium syndrome 09/27/2011  . Chronic bronchitis ([place]/24/2012  . Essential hypertension 05/24/2011  . Type 2 diabetes mellitus without complications ([id number]. Pulmonary nodules 05/05/2011   MaJanna ArchPT, DPT   03/26/2019, 3:59 PM  [facility] [id number]  [place], [place], [address] Phone: [phone]   Fax:  [phone]  Name: Elis Rawlinson MRN: [id number] Date of Birth: 13-Jul-1951

## 2019-03-27 ENCOUNTER — Ambulatory Visit: Payer: Medicare Other | Admitting: Occupational Therapy

## 2019-03-27 ENCOUNTER — Other Ambulatory Visit: Payer: Self-pay

## 2019-03-27 DIAGNOSIS — I972 Postmastectomy lymphedema syndrome: Secondary | ICD-10-CM

## 2019-03-27 DIAGNOSIS — M545 Low back pain: Secondary | ICD-10-CM | POA: Diagnosis not present

## 2019-03-27 NOTE — Therapy (Signed)
[place] MAIN [facility] SERVICES [address] [place], [place], [address] Phone: [phone]   Fax:  [phone]  Occupational Therapy Treatment  Patient Details  Name: Susan Callahan MRN: [id number] Date of Birth: [birth year] Referring Provider (OT): Humberto Leep, MD   Encounter Date: 03/27/2019  OT End of Session - 03/27/19 1628    Visit Number  7    Number of Visits  36    Date for OT Re-Evaluation  05/20/19    OT Start Time  0315    OT Stop Time  0415    OT Time Calculation (min)  60 min    Activity Tolerance  Patient tolerated treatment well;No increased pain    Behavior During Therapy  WFL for tasks assessed/performed       Past Medical History:  Diagnosis Date  . COPD (chronic obstructive pulmonary disease) ([place])   . Depression 1974  . Diabetes mellitus without complication ([place])   . Hypertension   . Migraine   . 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 LUMBAR PERITONEAL SHUNT    . TYMPANOSTOMY TUBE PLACEMENT      There were no vitals filed for this visit.  Subjective Assessment - 03/27/19 1629    Subjective   Pt presents for OT visit 6/ 36 to address RUE/ RUQ post-mastectomy swelling and axillary web syndrome. Pt c/o increased cording discomfort  at distal volar forearm  just proximal to wrist    Currently in Pain?  Yes    Pain Score  --   not rated   Pain Location  Arm    Pain Orientation  Right    Pain Descriptors / Indicators  Burning;Sharp;Tightness;Sore;Other (Comment)   pulling   Pain Type  Acute pain;Chronic pain    Pain Onset  1 to 4 weeks ago    Pain Onset  More than a month ago                   OT Treatments/Exercises (OP) - 03/27/19 0001      ADLs   ADL Education Given  Yes      Manual Therapy   Manual Therapy  Manual Lymphatic Drainage (MLD);Myofascial release;Edema management    Myofascial Release  R volar forearm and arm as  established.    Manual Lymphatic Drainage (MLD)  MLD to RUE/ RUQ  utilizing ipsilateral axillo-inguinal watershed, and anterior and posterior axillary anastamoses. Pt tolerated well in supine and sidelying. No myofacial release today.             OT Education - 03/27/19 1631    Education Details  Pt edu for "unwinding " stretch in sidelying and tabletop stretches for shoulder flexion and abduction on affected side. excellent return    Person(s) Educated  Patient    Methods  Demonstration;Explanation;Tactile cues;Verbal cues;Handout    Comprehension  Verbalized understanding;Returned demonstration          OT Long Term Goals - 02/20/19 1650      OT LONG TERM GOAL #1   Title  Pt independent with lymphedema precautions and prevention strategies to limit LE progression.    Baseline  Max A    Time  4    Period  Days    Status  New    Target Date  --   4th OT Rx visit     OT LONG TERM GOAL #2   Title  Pt will  be independent with lymphedema self-care home program and AWS ther ex to improve functional performance of basic and instrumental ADLs by DC.    Baseline  Max A    Time  12    Period  Weeks    Status  New    Target Date  05/20/19   4th OT Rx visit     OT LONG TERM GOAL #3   Title  Pt wil report 0/10 pain with RUE functional use ( reaching, lifting, carrying, dressing, dressing, etc) to achieve independent , pain free ADLs performance.    Baseline  Max A    Time  12    Period  Weeks    Status  New    Target Date  05/20/19            Plan - 03/27/19 1632    Clinical Impression Statement  Pt toelrated myofacial release and MLD to RUE and RUQ today with mild  pain in areas of cording- axilla, antecubital fossa and proximal to volar wrist. Cording iremains palpable and visible at all sites.No cords released today despite ongoing stretches. 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 Cont as per POC.    OT Occupational  Profile and History  Comprehensive Assessment- Review of records and extensive additional review of physical, cognitive, psychosocial history related to current functional performance    Occupational performance deficits (Please refer to evaluation for details):  ADL's;IADL's;Social Participation;Leisure;Rest and Sleep;Work    Marketing executive / Function / Physical Skills  ADL;Decreased knowledge of precautions;ROM;UE functional use;Decreased knowledge of use of DME;Edema;Skin integrity;Pain;IADL    Rehab Potential  Good    Clinical Decision Making  Several treatment options, min-mod task modification necessary    Comorbidities Affecting Occupational Performance:  Presence of comorbidities impacting occupational performance    Comorbidities impacting occupational performance description:  see SUBJUECTIVE    Modification or Assistance to Complete Evaluation   Min-Moderate modification of tasks or assist with assess necessary to complete eval    OT Frequency  2x / week    OT Duration  12 weeks    OT Treatment/Interventions  Self-care/ADL training;Therapeutic exercise;Manual lymph drainage;Patient/family education;Therapeutic activities;DME and/or AE instruction;Manual Therapy;Other (comment)   consider fitting with prophylactic cmopression arm sleeve   Plan  BUE AROM ther ex to reduce axillary cording- 2 sets of 10 2-3 x daily    Consulted and Agree with Plan of Care  Patient       Patient will benefit from skilled therapeutic intervention in order to improve the following deficits and impairments:   Body Structure / Function / Physical Skills: ADL, Decreased knowledge of precautions, ROM, UE functional use, Decreased knowledge of use of DME, Edema, Skin integrity, Pain, IADL       Visit Diagnosis: 1. Postmastectomy lymphedema syndrome       Problem List Patient Active Problem List   Diagnosis Date Noted  . Postoperative wound infection 03/13/2019  . Seroma of breast 03/11/2019  .  Age-related osteoporosis without current pathological fracture 02/27/2019  . Malignant neoplasm of central portion of right breast in female, estrogen receptor positive ([place]) 02/12/2019  . Genetic testing 01/31/2019  . Malignant neoplasm of upper-inner quadrant of right breast in female, estrogen receptor positive ([place]) 01/15/2019  . Long term current use of opiate analgesic 12/02/2018  . Tympanic membrane central perforation, left 11/04/2018  . DDD (degenerative disc disease), lumbosacral 10/10/2018  . Abnormal MRI, lumbar spine (08/29/2018) 10/10/2018  .  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 Lumbar nerve root impingement (Bilateral: L4 & L5) (L>R: L5) 10/10/2018  . Lumbar Spinal instability of L4-5 10/10/2018  . Lumbar facet hypertrophy 10/10/2018  . COPD (chronic obstructive pulmonary disease) with emphysema ([place]) 08/28/2018  . Elevated rheumatoid factor 08/13/2018  . Elevated C-reactive protein (CRP) 08/12/2018  . Elevated sed rate 08/12/2018  . Lumbar facet syndrome (Bilateral) 08/12/2018  . Back pain with left-sided radiculopathy 08/12/2018  . DDD (degenerative disc disease), lumbar 08/12/2018  . Chronic lower extremity pain (Primary Area of Pain) (Bilateral) (L>R) 07/23/2018  . Chronic low back pain (Secondary Area of Pain) (Bilateral) (L>R) w/ sciatica (Bilateral) 07/23/2018  . Chronic pain syndrome 07/23/2018  . Pharmacologic therapy 07/23/2018  . Disorder of skeletal system 07/23/2018  . Problems influencing health status 07/23/2018  . Chronic sacroiliac joint pain [facility] Area of Pain) (Bilateral) (L>R) 07/23/2018  . Status post lumboperitoneal shunt placement 02/26/2018  . 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 Tympanic membrane perforation 05/30/2016  . PMB (postmenopausal bleeding) 04/26/2016  . Rash 12/27/2015  . 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 Plantar fasciitis of foot (Left) 05/26/2015  . Spondylosis of lumbar region without myelopathy or radiculopathy 05/26/2015  . Lumbar foraminal stenosis (L3-4, L4-5, L5-S1) (L>R) 05/26/2015  . Spinal stenosis of lumbar region with neurogenic claudication 05/26/2015  . Encounter for screening mammogram for malignant neoplasm of breast 12/02/2014  . Goiter 12/02/2014  . Chronic idiopathic gout of foot (Right) 07/06/2014  . Biceps tendonitis (Right) 05/19/2014  . Shoulder pain (Right) 05/19/2014  . Facial weakness 05/05/2014  . Post herpetic neuralgia 05/05/2014  . Chronic dysfunction of left eustachian tube 03/24/2014  . Osteopenia 03/13/2014  . Lumbar Grade 1 Anterolisthesis of L3/4 (33mm) & L4/5 (4-56mm)(w/ Dynamic instability) 03/13/2014  . Family history of colon cancer 12/02/2013  . Dyshidrotic eczema 11/17/2013  . Diastasis recti 04/08/2013  . Facial nerve paresis 07/31/2012  . Blepharospasm 05/08/2012  . 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 Obesity 03/28/2012  . Hemoptysis 12/22/2011  . Neuropathic postherpetic trigeminal neuralgia 12/12/2011  . Allergic rhinitis, mild 10/12/2011  . Disequilibrium syndrome 09/27/2011  . Chronic bronchitis ([place]) 06/05/2011  . Essential hypertension 05/24/2011  . Type 2 diabetes mellitus without complications ([place]) [id number]/[id number]  . Pulmonary nodules 05/05/2011    Andrey Spearman, MS, OTR/L, [facility] 03/27/19 4:35 PM  [place] MAIN [facility] SERVICES [address] [place], [place], [address] Phone: [phone]   Fax:  [phone]  Name: Aaliayah Miao MRN: [id number] Date of Birth: March 27, 1951

## 2019-03-28 ENCOUNTER — Other Ambulatory Visit: Admission: RE | Admit: 2019-03-28 | Payer: Medicare Other | Source: Ambulatory Visit

## 2019-03-31 ENCOUNTER — Ambulatory Visit: Payer: Medicare Other | Admitting: Occupational Therapy

## 2019-03-31 ENCOUNTER — Ambulatory Visit: Payer: Medicare Other

## 2019-03-31 ENCOUNTER — Other Ambulatory Visit: Payer: Self-pay

## 2019-03-31 DIAGNOSIS — I972 Postmastectomy lymphedema syndrome: Secondary | ICD-10-CM

## 2019-03-31 DIAGNOSIS — G8929 Other chronic pain: Secondary | ICD-10-CM

## 2019-03-31 DIAGNOSIS — M6281 Muscle weakness (generalized): Secondary | ICD-10-CM

## 2019-03-31 DIAGNOSIS — R293 Abnormal posture: Secondary | ICD-10-CM

## 2019-03-31 DIAGNOSIS — R2689 Other abnormalities of gait and mobility: Secondary | ICD-10-CM

## 2019-03-31 DIAGNOSIS — M545 Low back pain: Secondary | ICD-10-CM | POA: Diagnosis not present

## 2019-03-31 NOTE — Therapy (Signed)
[place] MAIN [facility] SERVICES [address] [place], [place], [address] Phone: [phone]   Fax:  [phone]  Occupational Therapy Treatment  Patient Details  Name: Susan Callahan MRN: [id number] Date of Birth: 1950/12/06 Referring Provider (OT): Humberto Leep, MD   Encounter Date: 03/31/2019  OT End of Session - 03/31/19 1758    Visit Number  8    Number of Visits  36    Date for OT Re-Evaluation  05/20/19    OT Start Time  0315    OT Stop Time  0415    OT Time Calculation (min)  60 min    Activity Tolerance  Patient tolerated treatment well;No increased pain    Behavior During Therapy  WFL for tasks assessed/performed       Past Medical History:  Diagnosis Date  . COPD (chronic obstructive pulmonary disease) ([place])   . Depression 1974  . Diabetes mellitus without complication ([place])   . Hypertension   . Migraine   . 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 LUMBAR PERITONEAL SHUNT    . TYMPANOSTOMY TUBE PLACEMENT      There were no vitals filed for this visit.  Subjective Assessment - 03/31/19 1755    Subjective   Pt presents for OT visit 7/ 36 to address RUE/ RUQ post-mastectomy swelling and axillary web syndrome. Pt reports reduced pain and discomfort in RUE at end ranges of mothion of elbow, shoulder and wrist with both resistance and non resistive AROM during ADLs.    Pain Onset  1 to 4 weeks ago    Pain Onset  More than a month ago                   OT Treatments/Exercises (OP) - 03/31/19 0001      ADLs   ADL Education Given  Yes      Manual Therapy   Manual Therapy  Edema management;Manual Lymphatic Drainage (MLD)    Myofascial Release  R volar forearm and arm as established.    Manual Lymphatic Drainage (MLD)  MLD to RUE/ RUQ  utilizing ipsilateral axillo-inguinal watershed, and anterior and posterior axillary anastamoses. Pt tolerated well in supine  and sidelying. No myofacial release today.             OT Education - 03/31/19 1757    Education Details  Cont Pt edu for therapeutic exercise and activities for sustained stretch to axillary cording    Person(s) Educated  Patient    Methods  Demonstration;Explanation;Tactile cues;Verbal cues;Handout    Comprehension  Verbalized understanding;Returned demonstration          OT Long Term Goals - 02/20/19 1650      OT LONG TERM GOAL #1   Title  Pt independent with lymphedema precautions and prevention strategies to limit LE progression.    Baseline  Max A    Time  4    Period  Days    Status  New    Target Date  --   4th OT Rx visit     OT LONG TERM GOAL #2   Title  Pt will be independent with lymphedema self-care home program and AWS ther ex to improve functional performance of basic and instrumental ADLs by DC.    Baseline  Max A    Time  12    Period  Weeks    Status  New  Target Date  05/20/19   4th OT Rx visit     OT LONG TERM GOAL #3   Title  Pt wil report 0/10 pain with RUE functional use ( reaching, lifting, carrying, dressing, dressing, etc) to achieve independent , pain free ADLs performance.    Baseline  Max A    Time  12    Period  Weeks    Status  New    Target Date  05/20/19            Plan - 03/31/19 1759    Clinical Impression Statement  Pt demonstrates full , nearly pain free RUE AROM at begining of session, influding full R shoulder adduction and flexion with fully extended elbow and wrist. We are still able to palpate several cords at antegubital fossa and axilla, but Pt with less pain  at rest and with functional use. Redness and pain in axilla has resolved. Single cord released with manual therapy at distal volar arm just proximal to radial fa in clinic today. Cont as per POC. Discuss prophylactic compression arm sleeve next visit.    OT Occupational Profile and History  Comprehensive Assessment- Review of records and extensive additional  review of physical, cognitive, psychosocial history related to current functional performance    Occupational performance deficits (Please refer to evaluation for details):  ADL's;IADL's;Social Participation;Leisure;Rest and Sleep;Work    Marketing executive / Function / Physical Skills  ADL;Decreased knowledge of precautions;ROM;UE functional use;Decreased knowledge of use of DME;Edema;Skin integrity;Pain;IADL    Rehab Potential  Good    Clinical Decision Making  Several treatment options, min-mod task modification necessary    Comorbidities Affecting Occupational Performance:  Presence of comorbidities impacting occupational performance    Comorbidities impacting occupational performance description:  see SUBJUECTIVE    Modification or Assistance to Complete Evaluation   Min-Moderate modification of tasks or assist with assess necessary to complete eval    OT Frequency  2x / week    OT Duration  12 weeks    OT Treatment/Interventions  Self-care/ADL training;Therapeutic exercise;Manual lymph drainage;Patient/family education;Therapeutic activities;DME and/or AE instruction;Manual Therapy;Other (comment)   consider fitting with prophylactic cmopression arm sleeve   Plan  BUE AROM ther ex to reduce axillary cording- 2 sets of 10 2-3 x daily    Consulted and Agree with Plan of Care  Patient       Patient will benefit from skilled therapeutic intervention in order to improve the following deficits and impairments:   Body Structure / Function / Physical Skills: ADL, Decreased knowledge of precautions, ROM, UE functional use, Decreased knowledge of use of DME, Edema, Skin integrity, Pain, IADL       Visit Diagnosis: 1. Postmastectomy lymphedema syndrome       Problem List Patient Active Problem List   Diagnosis Date Noted  . Postoperative wound infection 03/13/2019  . Seroma of breast 03/11/2019  . Age-related osteoporosis without current pathological fracture 02/27/2019  . Malignant  neoplasm of central portion of right breast in female, estrogen receptor positive ([place]) 02/12/2019  . Genetic testing 01/31/2019  . Malignant neoplasm of upper-inner quadrant of right breast in female, estrogen receptor positive ([place]) 01/15/2019  . Long term current use of opiate analgesic 12/02/2018  . Tympanic membrane central perforation, left 11/04/2018  . DDD (degenerative disc disease), lumbosacral 10/10/2018  . Abnormal MRI, lumbar spine (08/29/2018) 10/10/2018  . 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 Lumbar nerve root impingement (Bilateral: L4 & L5) (L>R: L5)  10/10/2018  . Lumbar Spinal instability of L4-5 10/10/2018  . Lumbar facet hypertrophy 10/10/2018  . COPD (chronic obstructive pulmonary disease) with emphysema ([place]) 08/28/2018  . Elevated rheumatoid factor 08/13/2018  . Elevated C-reactive protein (CRP) 08/12/2018  . Elevated sed rate 08/12/2018  . Lumbar facet syndrome (Bilateral) 08/12/2018  . Back pain with left-sided radiculopathy 08/12/2018  . DDD (degenerative disc disease), lumbar 08/12/2018  . Chronic lower extremity pain (Primary Area of Pain) (Bilateral) (L>R) 07/23/2018  . Chronic low back pain (Secondary Area of Pain) (Bilateral) (L>R) w/ sciatica (Bilateral) 07/23/2018  . Chronic pain syndrome 07/23/2018  . Pharmacologic therapy 07/23/2018  . Disorder of skeletal system 07/23/2018  . Problems influencing health status 07/23/2018  . Chronic sacroiliac joint pain [facility] Area of Pain) (Bilateral) (L>R) 07/23/2018  . Status post lumboperitoneal shunt placement 02/26/2018  . 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 Tympanic membrane perforation 05/30/2016  . PMB (postmenopausal bleeding) 04/26/2016  . Rash 12/27/2015  . 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 Plantar fasciitis of foot (Left) 05/26/2015  . Spondylosis of lumbar region without  myelopathy or radiculopathy 05/26/2015  . Lumbar foraminal stenosis (L3-4, L4-5, L5-S1) (L>R) 05/26/2015  . Spinal stenosis of lumbar region with neurogenic claudication 05/26/2015  . Encounter for screening mammogram for malignant neoplasm of breast 12/02/2014  . Goiter 12/02/2014  . Chronic idiopathic gout of foot (Right) 07/06/2014  . Biceps tendonitis (Right) 05/19/2014  . Shoulder pain (Right) 05/19/2014  . Facial weakness 05/05/2014  . Post herpetic neuralgia 05/05/2014  . Chronic dysfunction of left eustachian tube 03/24/2014  . Osteopenia 03/13/2014  . Lumbar Grade 1 Anterolisthesis of L3/4 (12mm) & L4/5 (4-36mm)(w/ Dynamic instability) 03/13/2014  . Family history of colon cancer 12/02/2013  . Dyshidrotic eczema 11/17/2013  . Diastasis recti 04/08/2013  . Facial nerve paresis 07/31/2012  . Blepharospasm 05/08/2012  . 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 Obesity 03/28/2012  . Hemoptysis 12/22/2011  . Neuropathic postherpetic trigeminal neuralgia 12/12/2011  . Allergic rhinitis, mild 10/12/2011  . Disequilibrium syndrome 09/27/2011  . Chronic bronchitis ([place]) 06/05/2011  . Essential hypertension 05/24/2011  . Type 2 diabetes mellitus without complications ([place]) 84/16/6063  . Pulmonary nodules 05/05/2011    Andrey Spearman, MS, OTR/L, [facility] 03/31/19 6:04 PM  [place] MAIN [facility] SERVICES [address] [place], [place], [address] Phone: [phone]   Fax:  [phone]  Name: Susan Callahan MRN: [id number] Date of Birth: [birth year]

## 2019-03-31 NOTE — Therapy (Signed)
[place] MAIN [facility] SERVICES [address] [place], [place], [address] Phone: [phone]   Fax:  [phone]  Physical Therapy Treatment  Patient Details  Name: Susan Callahan MRN: [id number] Date of Birth: 05/11/1951 Referring Provider (PT): Francesco Sor   Encounter Date: 03/31/2019  PT End of Session - 03/31/19 1508    Visit Number  42    Number of Visits  46    Date for PT Re-Evaluation  04/14/19    Authorization Type  2/10 PN on 03/24/19    PT Start Time  1433    PT Stop Time  1514    PT Time Calculation (min)  41 min    Equipment Utilized During Treatment  Gait belt    Activity Tolerance  Patient tolerated treatment well;Patient limited by pain    Behavior During Therapy  [facility] for tasks assessed/performed       Past Medical History:  Diagnosis Date  . COPD (chronic obstructive pulmonary disease) ([place])   . Depression 1974  . Diabetes mellitus without complication ([place])   . Hypertension   . Migraine   . 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 LUMBAR PERITONEAL SHUNT    . TYMPANOSTOMY TUBE PLACEMENT      There were no vitals filed for this visit.  Subjective Assessment - 03/31/19 1435    Subjective  Patient reports her back pain was relieved after last session and stayed non painful. Is slightly discomfort but better than before. No falls or LOB since last session.    Pertinent History  Pain began in 2007 and has gradually got worse and has progressed to nonstop. Patient states pain last all day long, everyday with no change in symptoms. Medication helps briefly. States she has difficulty with showering and standing. States she has to stand with her knee bent due to pulling sensation in legs. Pain begins immediately when stands and begins to walk. Pain feels better when she bends her knees when standing. Pain became constant and non stop around Dec 2018. Denies bowel or  bladder changes, unexplained weakness, or unexpected weight loss/gain. She currently goes to pulmonary rehab 3x a week, but has increased pain with treadmill walking. Will walk with cane on occasion around house especially at night due to occasional dizziness and vision impairments in medical history. States she also has difficulty with carrying groceries up steps due to low back pain. She would like to be able to walk better and have less pain to be less reliant on medication.     Currently in Pain?  Yes    Pain Score  3     Pain Location  Back    Pain Orientation  Lower    Pain Descriptors / Indicators  Aching;Burning    Pain Type  Chronic pain    Pain Onset  More than a month ago    Pain Frequency  Constant           Manual Therapy  L/R single knee to chest stretch x 30s each LE; Hamstring stretch in supine with increasing range 60 seconds each LE Piriformis stretch R and L 30 second holds  Roller to lumbar and thoracic paraspinals x 3 minutes.   SAD in figure 4 position with belt BLE 5x 20 seconds inferior direction Calf rollout with "the stick" 2 minutes each calf Hamstring rollout with " the stick" 2 minutes each calf  Ther-ex: cueing for sequencing, positioning and muscle activation  Hooklying HS stretch with DF/PF for sciatic nerve mobility x20 bilateral; Posterior pelvic tilts x15. tactile and verbal cue for appropriate technique 5 seconds with hold, improved alignment  posterior pelvic tilt with adduction squeeze in hooklying 15x 3 second holds   seated piriformis stretch 30 seconds each LE  swiss ball forward rollout 10x 10 second holds forward Swiss ball lateral/diagonal rollout 10x 10 second holds each direction (L and R)   Ambulate length of gym with focus on heel strike of LLE, 4x with increasing gait mechanics with repetition, additional TrA contraction with ambulation performed with improved gait mechanics and decreased discomfort with ambulation    Pt educated  throughout session about proper posture and technique with exercises. Improved exercise technique, movement at target joints, use of target muscles after min to mod verbal, visual, tactile cues.                        PT Education - 03/31/19 1507    Education Details  exercise technique, manual    Person(s) Educated  Patient    Methods  Explanation;Demonstration;Tactile cues;Verbal cues    Comprehension  Verbalized understanding;Returned demonstration;Verbal cues required;Tactile cues required       PT Short Term Goals - 03/17/19 1447      PT SHORT TERM GOAL #1   Title  Patient will be independent with completion of HEP to improve ability to complete functional tasks and functional ADLs.    Baseline  10/23: will give HEP next session 11/19: HEP compliance    Time  2    Period  Weeks    Status  Achieved      PT SHORT TERM GOAL #2   Title  Patient will report pain score less than 4/10 on VAS to demonstrate reduction of pain levels with functional tasks.     Baseline  10/23: 6/10 11/19: 5/10 12/12: 9/10 1/10: 7/10  2/11: 3/10     Time  2    Period  Weeks    Status  Achieved        PT Long Term Goals - 03/17/19 1447      PT LONG TERM GOAL #1   Title  Patient will report pain score of <3/10 on VAS for decreased pain levels and ease with functional activities.    Baseline  10/23: 6/10 11/19: 5/10 12/12 9/10 1/10: 7/10 2/11: 3/10 3/9: 6/10  5/26: 8/10 03/17/19: 7/10    Time  4    Period  Weeks    Status  Partially Met    Target Date  04/14/19      PT LONG TERM GOAL #2   Title  Patient will be able to ambulate on treadmill for 35mnutes without increase in back/leg pain demonstrating improved community ambulation and treadmill walking at pulmonary rehab    Baseline  10/23: 066mutes increases pain 11/19: 96m66mtes 12/12: has not had the chance to perform 1/10: was able to do 5 minutes without pain 2/11: able to do 5 minutes then had to bend over 3/9: 5 minutes 30  seconds at 2.0 mphs 5/26: had to walk 10 minutes at cancer center with extreme pain but was able to perform 7/6: walks 10 minutes with increase of pain by 2 points    Time  4    Period  Weeks    Status  Partially Met    Target Date  04/14/19  PT LONG TERM GOAL #3   Title  Patient will have decreased modified oswestry score <50% for improved ability to complete household tasks with less pain levels.     Baseline  10/23: 62% 11/19: 42%      Time  4    Period  Weeks    Status  Achieved      PT LONG TERM GOAL #4   Title  Patient will score 4+/5 on BLE MMT to demonstrate improved glute strength and ability to carry groceries with increased ease up/down stairs at home.    Baseline  10/23: 4-/5 grossly 11/19: 4/5 grossly; hamstrings 4-/5 12/12: 4/5 gross 1/10: 4/5 gross 2/11: gross 4+/5 with hamstrings and abuductors 4-/5 3/9: gross 4+/5 hamstrings and abductors 4-/5 5/27: grossly 3+/5 with pain with LLE movements 7/6: grossly 4-/5 with pain with LLE movement    Time  4    Period  Weeks    Status  On-going    Target Date  04/14/19      PT LONG TERM GOAL #5   Title  Patient will have decreased modified oswestry score <35% for improved ability to complete household tasks with less pain levels.     Baseline  11/19: 42% 12/12: 52% 1/10: 48%  2/11: 30%     Time  4    Period  Weeks    Status  Achieved      PT LONG TERM GOAL #6   Title  Patient will be able to make and/or strip the bed without pain and without needing rest breaks to perform iADLs independently.     Baseline  2/11: pain limits ability to perform, ~4 breaks required 3/9: takes her time, 3 breaks 5/27: very painful still 7/6: requires only one rest break    Time  4    Period  Weeks    Status  Partially Met    Target Date  04/14/19      PT LONG TERM GOAL #7   Title  Patient will have decreased modified oswestry score <20% for improved ability to complete household tasks with less pain levels.     Baseline  2/11: 30%  3/9: 30%  5/27: 63% 7/6: 38%    Time  4    Period  Weeks    Status  On-going    Target Date  04/14/19            Plan - 03/31/19 1511    Clinical Impression Statement  Patient presents with improved pain levels. Left LE and low back musculature continue to be limited in muscle tissue length which is improved with manual and therex. Patient educated on need for strengthening and lengthening musculature to maintain pain free mobility for prolonged periods of time. Patient will continue to benefit from skilled physical therapy to improve strength, to reduce pain levels with ADLs, and to improve quality of life.    Rehab Potential  Fair    Clinical Impairments Affecting Rehab Potential  (+) motivation (-) chronicity of symptoms    PT Frequency  2x / week    PT Duration  4 weeks    PT Treatment/Interventions  ADLs/Self Care Home Management;Cryotherapy;Moist Heat;Traction;Iontophoresis 66m/ml Dexamethasone;Ultrasound;Gait training;Stair training;Functional mobility training;Neuromuscular re-education;Balance training;Therapeutic exercise;Therapeutic activities;Patient/family education;Manual techniques;Energy conservation;Aquatic Therapy;Electrical Stimulation;Passive range of motion;Dry needling;Taping    PT Next Visit Plan  cross body coordination, progress core/postural stability, progress BLE strengthening    PT Home Exercise Plan  No updates this day    Consulted and Agree  with Plan of Care  Patient       Patient will benefit from skilled therapeutic intervention in order to improve the following deficits and impairments:  Abnormal gait, Cardiopulmonary status limiting activity, Decreased activity tolerance, Decreased balance, Decreased endurance, Decreased range of motion, Difficulty walking, Decreased strength, Hypomobility, Decreased mobility, Impaired perceived functional ability, Impaired flexibility, Postural dysfunction, Obesity, Improper body mechanics, Pain  Visit Diagnosis: 1. Chronic low  back pain, unspecified back pain laterality, unspecified whether sciatica present   2. Abnormal posture   3. Muscle weakness (generalized)   4. Other abnormalities of gait and mobility        Problem List Patient Active Problem List   Diagnosis Date Noted  . Postoperative wound infection 03/13/2019  . Seroma of breast 03/11/2019  . Age-related osteoporosis without current pathological fracture 02/27/2019  . Malignant neoplasm of central portion of right breast in female, estrogen receptor positive ([place]) 02/12/2019  . Genetic testing 01/31/2019  . Malignant neoplasm of upper-inner quadrant of right breast in female, estrogen receptor positive ([place]) 01/15/2019  . Long term current use of opiate analgesic 12/02/2018  . Tympanic membrane central perforation, left 11/04/2018  . DDD (degenerative disc disease), lumbosacral 10/10/2018  . Abnormal MRI, lumbar spine (08/29/2018) 10/10/2018  . 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 Lumbar nerve root impingement (Bilateral: L4 & L5) (L>R: L5) 10/10/2018  . Lumbar Spinal instability of L4-5 10/10/2018  . Lumbar facet hypertrophy 10/10/2018  . COPD (chronic obstructive pulmonary disease) with emphysema ([place]) 08/28/2018  . Elevated rheumatoid factor 08/13/2018  . Elevated C-reactive protein (CRP) 08/12/2018  . Elevated sed rate 08/12/2018  . Lumbar facet syndrome (Bilateral) 08/12/2018  . Back pain with left-sided radiculopathy 08/12/2018  . DDD (degenerative disc disease), lumbar 08/12/2018  . Chronic lower extremity pain (Primary Area of Pain) (Bilateral) (L>R) 07/23/2018  . Chronic low back pain (Secondary Area of Pain) (Bilateral) (L>R) w/ sciatica (Bilateral) 07/23/2018  . Chronic pain syndrome 07/23/2018  . Pharmacologic therapy 07/23/2018  . Disorder of skeletal system 07/23/2018  . Problems influencing health status 07/23/2018  . Chronic sacroiliac joint pain [facility] Area of Pain)  (Bilateral) (L>R) 07/23/2018  . Status post lumboperitoneal shunt placement 02/26/2018  . 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 Tympanic membrane perforation 05/30/2016  . PMB (postmenopausal bleeding) 04/26/2016  . Rash 12/27/2015  . 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 Plantar fasciitis of foot (Left) 05/26/2015  . Spondylosis of lumbar region without myelopathy or radiculopathy 05/26/2015  . Lumbar foraminal stenosis (L3-4, L4-5, L5-S1) (L>R) 05/26/2015  . Spinal stenosis of lumbar region with neurogenic claudication 05/26/2015  . Encounter for screening mammogram for malignant neoplasm of breast 12/02/2014  . Goiter 12/02/2014  . Chronic idiopathic gout of foot (Right) 07/06/2014  . Biceps tendonitis (Right) 05/19/2014  . Shoulder pain (Right) 05/19/2014  . Facial weakness 05/05/2014  . Post herpetic neuralgia 05/05/2014  . Chronic dysfunction of left eustachian tube 03/24/2014  . Osteopenia 03/13/2014  . Lumbar Grade 1 Anterolisthesis of L3/4 (36m) & L4/5 (4-139m(w/ Dynamic instability) 03/13/2014  . Family history of colon cancer 12/02/2013  . Dyshidrotic eczema 11/17/2013  . Diastasis recti 04/08/2013  . Facial nerve paresis 07/31/2012  . Blepharospasm 05/08/2012  . 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 Obesity 03/28/2012  . Hemoptysis 12/22/2011  . Neuropathic postherpetic trigeminal neuralgia 12/12/2011  . Allergic rhinitis,  mild 10/12/2011  . Disequilibrium syndrome 09/27/2011  . Chronic bronchitis ([place]) 06/05/2011  . Essential hypertension 05/24/2011  . Type 2 diabetes mellitus without complications ([place]) 33/35/4562  . Pulmonary nodules 05/05/2011   Janna Arch, PT, DPT   03/31/2019, 3:16 PM  [place] MAIN [facility] SERVICES [address] [place], [place], [address] Phone: [phone]   Fax:   [phone]  Name: Susan Callahan MRN: [id number] Date of Birth: [birth year]

## 2019-04-01 ENCOUNTER — Ambulatory Visit (HOSPITAL_BASED_OUTPATIENT_CLINIC_OR_DEPARTMENT_OTHER): Payer: Medicare Other | Admitting: Pain Medicine

## 2019-04-01 ENCOUNTER — Other Ambulatory Visit: Payer: Self-pay

## 2019-04-01 ENCOUNTER — Ambulatory Visit
Admission: RE | Admit: 2019-04-01 | Discharge: 2019-04-01 | Disposition: A | Discharge status: Home / Self Care | Payer: Medicare Other | Source: Ambulatory Visit | Attending: Pain Medicine | Admitting: Pain Medicine

## 2019-04-01 ENCOUNTER — Encounter: Payer: Self-pay | Admitting: Pain Medicine

## 2019-04-01 VITALS — BP 131/70 | HR 68 | Temp 98.1°F | Resp 16 | Ht 65.0 in | Wt 210.0 lb

## 2019-04-01 DIAGNOSIS — M5416 Radiculopathy, lumbar region: Secondary | ICD-10-CM

## 2019-04-01 DIAGNOSIS — M5137 Other intervertebral disc degeneration, lumbosacral region: Secondary | ICD-10-CM

## 2019-04-01 DIAGNOSIS — M4310 Spondylolisthesis, site unspecified: Secondary | ICD-10-CM | POA: Diagnosis present

## 2019-04-01 DIAGNOSIS — M79605 Pain in left leg: Secondary | ICD-10-CM | POA: Diagnosis present

## 2019-04-01 DIAGNOSIS — M48061 Spinal stenosis, lumbar region without neurogenic claudication: Secondary | ICD-10-CM

## 2019-04-01 DIAGNOSIS — M48062 Spinal stenosis, lumbar region with neurogenic claudication: Secondary | ICD-10-CM | POA: Insufficient documentation

## 2019-04-01 DIAGNOSIS — M79604 Pain in right leg: Secondary | ICD-10-CM | POA: Insufficient documentation

## 2019-04-01 DIAGNOSIS — G8929 Other chronic pain: Secondary | ICD-10-CM

## 2019-04-01 DIAGNOSIS — M5442 Lumbago with sciatica, left side: Secondary | ICD-10-CM | POA: Insufficient documentation

## 2019-04-01 DIAGNOSIS — M5441 Lumbago with sciatica, right side: Secondary | ICD-10-CM | POA: Diagnosis present

## 2019-04-01 DIAGNOSIS — M532X6 Spinal instabilities, lumbar region: Secondary | ICD-10-CM

## 2019-04-01 MED ORDER — TRIAMCINOLONE ACETONIDE 40 MG/ML IJ SUSP
40.0000 mg | Freq: Once | INTRAMUSCULAR | Status: AC
  Administered 2019-04-01: 40 mg
  Filled 2019-04-01: qty 1

## 2019-04-01 MED ORDER — FENTANYL CITRATE (PF) 100 MCG/2ML IJ SOLN: 25.0000 ug | INTRAMUSCULAR | Status: DC | PRN

## 2019-04-01 MED ORDER — SODIUM CHLORIDE 0.9% FLUSH
2.0000 mL | Freq: Once | INTRAVENOUS | Status: AC
  Administered 2019-04-01: 2 mL

## 2019-04-01 MED ORDER — LACTATED RINGERS IV SOLN: 1000.0000 mL | Freq: Once | INTRAVENOUS | Status: DC

## 2019-04-01 MED ORDER — ROPIVACAINE HCL 2 MG/ML IJ SOLN
2.0000 mL | Freq: Once | INTRAMUSCULAR | Status: AC
  Administered 2019-04-01: 2 mL via EPIDURAL
  Filled 2019-04-01: qty 10

## 2019-04-01 MED ORDER — MIDAZOLAM HCL 5 MG/5ML IJ SOLN: 1.0000 mg | INTRAMUSCULAR | Status: DC | PRN

## 2019-04-01 MED ORDER — DEXAMETHASONE SODIUM PHOSPHATE 10 MG/ML IJ SOLN
20.0000 mg | Freq: Once | INTRAMUSCULAR | Status: AC
  Administered 2019-04-01: 20 mg
  Filled 2019-04-01: qty 2

## 2019-04-01 MED ORDER — SODIUM CHLORIDE (PF) 0.9 % IJ SOLN
INTRAMUSCULAR | Status: AC
  Filled 2019-04-01: qty 10

## 2019-04-01 MED ORDER — IOHEXOL 180 MG/ML  SOLN
10.0000 mL | Freq: Once | INTRAMUSCULAR | Status: AC
  Administered 2019-04-01: 10 mL via EPIDURAL

## 2019-04-01 MED ORDER — LIDOCAINE HCL 2 % IJ SOLN
20.0000 mL | Freq: Once | INTRAMUSCULAR | Status: AC
  Administered 2019-04-01: 10:00:00 400 mg
  Filled 2019-04-01: qty 20

## 2019-04-01 NOTE — Progress Notes (Signed)
Safety precautions to be maintained throughout the outpatient stay will include: orient to surroundings, keep bed in low position, maintain call bell within reach at all times, provide assistance with transfer out of bed and ambulation.  

## 2019-04-01 NOTE — Progress Notes (Signed)
Patient's Name: Susan Callahan  MRN: [id number]  Referring Provider: Konrad Saha, MD  DOB: 08-Oct-1950  PCP: Konrad Saha, MD  DOS: 04/01/2019  Note by: Gaspar Cola, MD  Service setting: Ambulatory outpatient  Specialty: Interventional Pain Management  Patient type: Established  Location: [facility] (AMB) Pain Management Facility  Visit type: Interventional Procedure   Primary Reason for Visit: Interventional Pain Management Treatment. CC: Back Pain (lower)  Procedure #1:  Anesthesia, Analgesia, Anxiolysis:  Type: Therapeutic Trans-Foraminal Epidural Steroid Injection   #2  Region: Lumbar Level: L4 Paravertebral Laterality: Bilateral Paravertebral   Type: Local Anesthesia Indication(s): Analgesia         Route: Infiltration ([place]/IM) IV Access: Declined Sedation: Declined  Local Anesthetic: Lidocaine 1-2%  Position: Prone  Procedure #2:    Type: Therapeutic Inter-Laminar Epidural Steroid Injection   #2  Region: Lumbar Level: L3-4 Level. Laterality: Left Paramedial     Indications: 1. DDD (degenerative disc disease), lumbosacral   2. 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 Lumbar foraminal stenosis (L3-4, L4-5, L5-S1) (L>R)   4. 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 Chronic low back pain (Secondary Area of Pain) (Bilateral) (L>R) w/ sciatica (Bilateral)   8. Chronic lower extremity pain (Primary Area of Pain) (Bilateral) (L>R)    Pain Score: Pre-procedure: 3 /10 Post-procedure: 0-No pain/10   Pertinent Labs  COVID-19 screennig: Lab Results  Component Value Date   SARSCOV2NAA NOT DETECTED 03/10/2019   Pre-op Assessment:  Susan Callahan is a 68 y.o. (year old), female patient, seen today for interventional treatment. She  has a past surgical history that includes Tympanostomy  tube placement and Lumbar peritoneal shunt. Susan Callahan has a current medication list which includes the following prescription(s): allopurinol, amitriptyline, b complex vitamins, calcium carbonate-vitamin d, furosemide, gabapentin, letrozole, magnesium oxide, metformin, multivitamin, mupirocin ointment, tramadol, umeclidinium bromide, and verapamil. Her primarily concern today is the Back Pain (lower)  Initial Vital Signs:  Pulse/HCG Rate: 71  Temp: 98.1 F (36.7 C) Resp: 16 BP: (!) 149/81 SpO2: 96 %  BMI: Estimated body mass index is 34.95 kg/m as calculated from the following:   Height as of this encounter: 5\' 5"  (1.651 m).   Weight as of this encounter: 210 lb (95.3 kg).  Risk Assessment: Allergies: Reviewed. She is allergic to penicillins; sulfa antibiotics; and bee venom.  Allergy Precautions: None required Coagulopathies: Reviewed. None identified.  Blood-thinner therapy: None at this time Active Infection(s): Reviewed. None identified. Susan Callahan is afebrile  Site Confirmation: Susan Callahan was asked to confirm the procedure and laterality before marking the site Procedure checklist: Completed Consent: Before the procedure and under the influence of no sedative(s), amnesic(s), or anxiolytics, the patient was informed of the treatment options, risks and possible complications. To fulfill our ethical and legal obligations, as recommended by the American Medical Association's Code of Ethics, I have informed the patient of my clinical impression; the nature and purpose of the treatment or procedure; the risks, benefits, and possible complications of the intervention; the alternatives, including doing nothing; the risk(s) and benefit(s) of the alternative treatment(s) or procedure(s); and the risk(s) and benefit(s) of doing nothing. The patient was provided information about the general risks and possible complications associated with the procedure. These may include, but are not limited to:  failure  to achieve desired goals, infection, bleeding, organ or nerve damage, allergic reactions, paralysis, and death. In addition, the patient was informed of those risks and complications associated to Spine-related procedures, such as failure to decrease pain; infection (i.e.: Meningitis, epidural or intraspinal abscess); bleeding (i.e.: epidural hematoma, subarachnoid hemorrhage, or any other type of intraspinal or peri-dural bleeding); organ or nerve damage (i.e.: Any type of peripheral nerve, nerve root, or spinal cord injury) with subsequent damage to sensory, motor, and/or autonomic systems, resulting in permanent pain, numbness, and/or weakness of one or several areas of the body; allergic reactions; (i.e.: anaphylactic reaction); and/or death. Furthermore, the patient was informed of those risks and complications associated with the medications. These include, but are not limited to: allergic reactions (i.e.: anaphylactic or anaphylactoid reaction(s)); adrenal axis suppression; blood sugar elevation that in diabetics may result in ketoacidosis or comma; water retention that in patients with history of congestive heart failure may result in shortness of breath, pulmonary edema, and decompensation with resultant heart failure; weight gain; swelling or edema; medication-induced neural toxicity; particulate matter embolism and blood vessel occlusion with resultant organ, and/or nervous system infarction; and/or aseptic necrosis of one or more joints. Finally, the patient was informed that Medicine is not an exact science; therefore, there is also the possibility of unforeseen or unpredictable risks and/or possible complications that may result in a catastrophic outcome. The patient indicated having understood very clearly. We have given the patient no guarantees and we have made no promises. Enough time was given to the patient to ask questions, all of which were answered to the patient's satisfaction. Ms.  Callahan has indicated that she wanted to continue with the procedure. Attestation: I, the ordering provider, attest that I have discussed with the patient the benefits, risks, side-effects, alternatives, likelihood of achieving goals, and potential problems during recovery for the procedure that I have provided informed consent. Date  Time: 04/01/2019  9:16 AM  Pre-Procedure Preparation:  Monitoring: As per clinic protocol. Respiration, ETCO2, SpO2, BP, heart rate and rhythm monitor placed and checked for adequate function Safety Precautions: Patient was assessed for positional comfort and pressure points before starting the procedure. Time-out: I initiated and conducted the "Time-out" before starting the procedure, as per protocol. The patient was asked to participate by confirming the accuracy of the "Time Out" information. Verification of the correct person, site, and procedure were performed and confirmed by me, the nursing staff, and the patient. "Time-out" conducted as per Joint Commission's Universal Protocol (UP.01.01.01). Time: 1005  Description of Procedure #1:  Target Area: The inferior and lateral portion of the pedicle, just lateral to a line created by the 6:00 position of the pedicle and the superior articular process of the vertebral body below. On the lateral view, this target lies just posterior to the anterior aspect of the lamina and posterior to the midpoint created between the anterior and the posterior aspect of the neural foramina. Approach: Posterior paravertebral approach. Area Prepped: Entire Posterior Lumbosacral Region Prepping solution: DuraPrep (Iodine Povacrylex [0.7% available iodine] and Isopropyl Alcohol, 74% w/w) Safety Precautions: Aspiration looking for blood return was conducted prior to all injections. At no point did we inject any substances, as a needle was being advanced. No attempts were made at seeking any paresthesias. Safe injection practices and needle  disposal techniques used. Medications properly checked for expiration dates. SDV (single dose vial) medications used.  Description of the Procedure: Protocol guidelines were followed. The patient was placed in position over the fluoroscopy table.  The target area was identified and the area prepped in the usual manner. Skin & deeper tissues infiltrated with local anesthetic. Appropriate amount of time allowed to pass for local anesthetics to take effect. The procedure needles were then advanced to the target area. Proper needle placement secured. Negative aspiration confirmed. Solution injected in intermittent fashion, asking for systemic symptoms every 0.2cc of injectate. The needles were then removed and the area cleansed, making sure to leave some of the prepping solution back to take advantage of its long term bactericidal properties.  Start Time: 1005 hrs.  Materials:  Needle(s) Type: Spinal Needle Gauge: 22G Length: 3.5-in Medication(s): Please see orders for medications and dosing details.  Description of Procedure #2:  Target Area: The  interlaminar space, initially targeting the lower border of the superior vertebral body lamina. Approach: Posterior paramedial approach. Area Prepped: Same as above Prepping solution: Same as above Safety Precautions: Same as above  Description of the Procedure: Protocol guidelines were followed. The patient was placed in position over the fluoroscopy table. The target area was identified and the area prepped in the usual manner. Skin & deeper tissues infiltrated with local anesthetic. Appropriate amount of time allowed to pass for local anesthetics to take effect. The procedure needle was introduced through the skin, ipsilateral to the reported pain, and advanced to the target area. Bone was contacted and the needle walked caudad, until the lamina was cleared. The ligamentum flavum was engaged and loss-of-resistance technique used as the epidural needle was  advanced. The epidural space was identified using "loss-of-resistance technique" with 2-3 ml of PF-NaCl (0.9% NSS), in a 5cc LOR glass syringe. Proper needle placement secured. Negative aspiration confirmed. Solution injected in intermittent fashion, asking for systemic symptoms every 0.5cc of injectate. The needles were then removed and the area cleansed, making sure to leave some of the prepping solution back to take advantage of its long term bactericidal properties.  Vitals:   04/01/19 0959 04/01/19 1009 04/01/19 1019 04/01/19 1023  BP: (!) 144/76 133/72 133/77 131/70  Pulse: 72 66 68 68  Resp: (!) 22 19 18 16   Temp:      TempSrc:      SpO2: 98% 98% 96% 97%  Weight:      Height:        End Time: 1020 hrs.  Materials:  Needle(s) Type: Epidural needle Gauge: 17G Length: 3.5-in Medication(s): Please see orders for medications and dosing details.  Imaging Guidance (Spinal):          Type of Imaging Technique: Fluoroscopy Guidance (Spinal) Indication(s): Assistance in needle guidance and placement for procedures requiring needle placement in or near specific anatomical locations not easily accessible without such assistance. Exposure Time: Please see nurses notes. Contrast: Before injecting any contrast, we confirmed that the patient did not have an allergy to iodine, shellfish, or radiological contrast. Once satisfactory needle placement was completed at the desired level, radiological contrast was injected. Contrast injected under live fluoroscopy. No contrast complications. See chart for type and volume of contrast used. Fluoroscopic Guidance: I was personally present during the use of fluoroscopy. "Tunnel Vision Technique" used to obtain the best possible view of the target area. Parallax error corrected before commencing the procedure. "Direction-depth-direction" technique used to introduce the needle under continuous pulsed fluoroscopy. Once target was reached, antero-posterior,  oblique, and lateral fluoroscopic projection used confirm needle placement in all planes. Images permanently stored in EMR. Interpretation: I personally interpreted the imaging intraoperatively. Adequate needle placement confirmed in multiple planes.  Appropriate spread of contrast into desired area was observed. No evidence of afferent or efferent intravascular uptake. No intrathecal or subarachnoid spread observed. Permanent images saved into the patient's record.  Antibiotic Prophylaxis:   Anti-infectives (From admission, onward)   None     Indication(s): None identified  Post-operative Assessment:  Post-procedure Vital Signs:  Pulse/HCG Rate: 68  Temp: 98.1 F (36.7 C) Resp: 16 BP: 131/70 SpO2: 97 %  EBL: None  Complications: No immediate post-treatment complications observed by team, or reported by patient.  Note: The patient tolerated the entire procedure well. A repeat set of vitals were taken after the procedure and the patient was kept under observation following institutional policy, for this type of procedure. Post-procedural neurological assessment was performed, showing return to baseline, prior to discharge. The patient was provided with post-procedure discharge instructions, including a section on how to identify potential problems. Should any problems arise concerning this procedure, the patient was given instructions to immediately contact us, at any time, without hesitation. In any case, we plan to contact the patient by telephone for a follow-up status report regarding this interventional procedure.  Comments:  No additional relevant information.  Plan of Care  Orders:  Orders Placed This Encounter  Procedures  . Lumbar Epidural Injection    Scheduling Instructions:     Procedure: Interlaminar LESI L3-4     Laterality: Left-sided     Sedation: Patient's choice     Timeframe:  Today    Order Specific Question:   Where will this procedure be performed?     Answer:   [facility]  . Lumbar Transforaminal Epidural    Scheduling Instructions:     Side: Bilateral     Level: L4     Sedation: With Sedation.     Timeframe: Today    Order Specific Question:   Where will this procedure be performed?    Answer:   [facility]  . DG PA[facility] C-ARM 1-60 MIN NO REPORT    Intraoperative interpretation by procedural physician at [facility].    Standing Status:   Standing    Number of Occurrences:   1    Order Specific Question:   Reason for exam:    Answer:   Assistance in needle guidance and placement for procedures requiring needle placement in or near specific anatomical locations not easily accessible without such assistance.  . Provider attestation of informed consent for procedure/surgical case    I, the ordering provider, attest that I have discussed with the patient the benefits, risks, side effects, alternatives, likelihood of achieving goals and potential problems during recovery for the procedure that I have provided informed consent.    Standing Status:   Standing    Number of Occurrences:   1  . Informed Consent Details: Transcribe to consent form and obtain patient signature    Standing Status:   Standing    Number of Occurrences:   1    Order Specific Question:   Procedure    Answer:   Lumbar epidural steroid injection under fluoroscopic guidance. (See notes for level and laterality.)    Order Specific Question:   Surgeon    Answer:   Georgene Kopper A. Dossie Arbour, MD    Order Specific Question:   Indication/Reason    Answer:   Low back pain and/or leg pain secondary to lumbar radiculitis/radiculopathy  . Informed Consent Details: Transcribe to consent form and obtain patient signature    Standing Status:  Standing    Number of Occurrences:   1    Order Specific Question:   Procedure    Answer:   Bilateral Lumbar transforaminal epidural steroid injection under fluoroscopic guidance. (See notes for level and  laterality.)    Order Specific Question:   Surgeon    Answer:   Daquane Aguilar A. Dossie Arbour, MD    Order Specific Question:   Indication/Reason    Answer:   Lumbar radiculopathy/radiculitis   Chronic Opioid Analgesic:  Tramadol 50 mg, 1 tab PO q 6 hrs (200 mg/day of tramadol) MME/day:20mg /day.   Medications ordered for procedure: Meds ordered this encounter  Medications  . iohexol (OMNIPAQUE) 180 MG/ML injection 10 mL    Must be Myelogram-compatible. If not available, you may substitute with a water-soluble, non-ionic, hypoallergenic, myelogram-compatible radiological contrast medium.  Marland Kitchen lidocaine (XYLOCAINE) 2 % (with pres) injection 400 mg  . DISCONTD: lactated ringers infusion 1,000 mL  . DISCONTD: midazolam (VERSED) 5 MG/5ML injection 1-2 mg    Make sure Flumazenil is available in the pyxis when using this medication. If oversedation occurs, administer 0.2 mg IV over 15 sec. If after 45 sec no response, administer 0.2 mg again over 1 min; may repeat at 1 min intervals; not to exceed 4 doses (1 mg)  . DISCONTD: fentaNYL (SUBLIMAZE) injection 25-50 mcg    Make sure Narcan is available in the pyxis when using this medication. In the event of respiratory depression (RR< 8/min): Titrate NARCAN (naloxone) in increments of 0.1 to 0.2 mg IV at 2-3 minute intervals, until desired degree of reversal.  . sodium chloride flush (NS) 0.9 % injection 2 mL  . ropivacaine (PF) 2 mg/mL (0.2%) (NAROPIN) injection 2 mL  . triamcinolone acetonide (KENALOG-40) injection 40 mg  . sodium chloride flush (NS) 0.9 % injection 2 mL  . ropivacaine (PF) 2 mg/mL (0.2%) (NAROPIN) injection 2 mL  . dexamethasone (DECADRON) injection 20 mg   Medications administered: We administered iohexol, lidocaine, sodium chloride flush, ropivacaine (PF) 2 mg/mL (0.2%), triamcinolone acetonide, sodium chloride flush, ropivacaine (PF) 2 mg/mL (0.2%), and dexamethasone.  See the medical record for exact dosing, route, and time of  administration.  Follow-up plan:   Return for (VV), 2 wk PP-F/U Eval.  Diagnostic/therapeutic bilateral L4 TFESI #2 + left L3-4 LESI #2 under fluoroscopic guidance and IV sedation (today)     Interventional management options:  Considering:   CAUTION: Has a LUMBOPERITONEAL SPINAL SHUNT entering the canal at L2-3 on left and going up.  Diagnostic Caudal ESI #1  Diagnostic bilateral lumbar facet nerve block #1  Possible bilateral lumbar facet RFA #1    PRN Procedures:   Palliative bilateral L4 TFESI #3  Palliative left L3-4 LESI #3     Recent Visits Date Type Provider Dept  03/13/19 Procedure visit Milinda Pointer, MD [facility]  03/03/19 Office Visit Milinda Pointer, MD [facility]  Showing recent visits within past 90 days and meeting all other requirements   Today's Visits Date Type Provider Dept  04/01/19 Procedure visit Milinda Pointer, MD [facility]  Showing today's visits and meeting all other requirements   Future Appointments Date Type Provider Dept  04/29/19 Appointment Milinda Pointer, MD [facility]  06/16/19 Appointment Milinda Pointer, MD [facility]  Showing future appointments within next 90 days and meeting all other requirements   Disposition: Discharge home  Discharge Date & Time: 04/01/2019; 1025 hrs.   Primary Care Physician: Konrad Saha, MD Location: [facility] Note by: Gaspar Cola, MD Date: 04/01/2019; Time: 10:47 AM  Disclaimer:  Medicine is not an Chief Strategy Officer. The only guarantee in medicine is that nothing is guaranteed. It is important to note that the decision to proceed with this intervention was based on the information collected from the patient. The Data and conclusions were drawn from the patient's questionnaire, the interview, and the physical examination. Because the information was provided in large part by the patient, it cannot be  guaranteed that it has not been purposely or unconsciously manipulated. Every effort has been made to obtain as much relevant data as possible for this evaluation. It is important to note that the conclusions that lead to this procedure are derived in large part from the available data. Always take into account that the treatment will also be dependent on availability of resources and existing treatment guidelines, considered by other Pain Management Practitioners as being common knowledge and practice, at the time of the intervention. For Medico-Legal purposes, it is also important to point out that variation in procedural techniques and pharmacological choices are the acceptable norm. The indications, contraindications, technique, and results of the above procedure should only be interpreted and judged by a Board-Certified Interventional Pain Specialist with extensive familiarity and expertise in the same exact procedure and technique.

## 2019-04-01 NOTE — Patient Instructions (Signed)

## 2019-04-02 ENCOUNTER — Ambulatory Visit: Payer: Medicare Other | Admitting: Occupational Therapy

## 2019-04-02 ENCOUNTER — Telehealth: Payer: Self-pay

## 2019-04-02 ENCOUNTER — Ambulatory Visit: Payer: Medicare Other

## 2019-04-02 DIAGNOSIS — R2689 Other abnormalities of gait and mobility: Secondary | ICD-10-CM

## 2019-04-02 DIAGNOSIS — M545 Low back pain: Secondary | ICD-10-CM | POA: Diagnosis not present

## 2019-04-02 DIAGNOSIS — G8929 Other chronic pain: Secondary | ICD-10-CM

## 2019-04-02 DIAGNOSIS — R293 Abnormal posture: Secondary | ICD-10-CM

## 2019-04-02 DIAGNOSIS — I972 Postmastectomy lymphedema syndrome: Secondary | ICD-10-CM

## 2019-04-02 DIAGNOSIS — M6281 Muscle weakness (generalized): Secondary | ICD-10-CM

## 2019-04-02 NOTE — Telephone Encounter (Signed)
Patient was called and no problems reported.

## 2019-04-02 NOTE — Therapy (Signed)
[place] MAIN [facility] SERVICES [address] [place], [place], [address] Phone: [phone]   Fax:  [phone]  Occupational Therapy Treatment  Patient Details  Name: Susan Callahan MRN: [id number] Date of Birth: Jan 06, 1951 Referring Provider (OT): Humberto Leep, MD   Encounter Date: 04/02/2019  OT End of Session - 04/02/19 1734    Visit Number  9    Number of Visits  36    Date for OT Re-Evaluation  05/20/19    OT Start Time  0315    OT Stop Time  0415    OT Time Calculation (min)  60 min    Activity Tolerance  Patient tolerated treatment well;No increased pain    Behavior During Therapy  WFL for tasks assessed/performed       Past Medical History:  Diagnosis Date  . COPD (chronic obstructive pulmonary disease) ([place])   . Depression 1974  . Diabetes mellitus without complication ([place])   . Hypertension   . Migraine   . 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 LUMBAR PERITONEAL SHUNT    . TYMPANOSTOMY TUBE PLACEMENT      There were no vitals filed for this visit.  Subjective Assessment - 04/02/19 1344    Subjective   Pt presents for OT visit 8/ 36 to address RUE/ RUQ post-mastectomy swelling and axillary web syndrome. 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 01/30/19 partial mastectomy. Margins clear. 0/2 LN  negative for  disease. actinic keritosis,DDD, DM, obesity, thyroid nodules, pulmonary nodules, chronic back pain, HTN, COPD, hx depression    Limitations  pain and swelling in R axilla and arm, chronic back pain, muscle weakness, impaired balance, decreased RUE AROM, abnormal gait, difficulty walking, impaired functional mobility and transfers,    Repetition  Increases Symptoms    Special Tests  -Stemmer at MPs bilaterally    Patient Stated Goals  relduce pain and improve movement  in my R arm so I can do things I  need to do. reduce risk of lymphedema    Pain Onset  More than a month ago    Pain Onset  More than a month ago                   OT Treatments/Exercises (OP) - 04/02/19 0001      ADLs   ADL Education Given  Yes      Manual Therapy   Manual Therapy  Edema management;Manual Lymphatic Drainage (MLD)    Myofascial Release  R volar forearm and arm as established.    Manual Lymphatic Drainage (MLD)  MLD to RUE/ RUQ  utilizing ipsilateral axillo-inguinal watershed, and anterior and posterior axillary anastamoses. Pt tolerated well in supine and sidelying. No myofacial release today.             OT Education - 04/02/19 1733    Education Details  Cont Pt edu for therapeutic exercise and activities for sustained stretch to axillary cording    Person(s) Educated  Patient    Methods  Demonstration;Explanation;Tactile cues;Verbal cues;Handout    Comprehension  Verbalized understanding;Returned demonstration          OT Long Term Goals - 02/20/19 1650      OT LONG TERM GOAL #1   Title  Pt independent with lymphedema precautions and prevention strategies to limit LE progression.  Baseline  Max A    Time  4    Period  Days    Status  New    Target Date  --   4th OT Rx visit     OT LONG TERM GOAL #2   Title  Pt will be independent with lymphedema self-care home program and AWS ther ex to improve functional performance of basic and instrumental ADLs by DC.    Baseline  Max A    Time  12    Period  Weeks    Status  New    Target Date  05/20/19   4th OT Rx visit     OT LONG TERM GOAL #3   Title  Pt wil report 0/10 pain with RUE functional use ( reaching, lifting, carrying, dressing, dressing, etc) to achieve independent , pain free ADLs performance.    Baseline  Max A    Time  12    Period  Weeks    Status  New    Target Date  05/20/19            Plan - 04/02/19 1734    Clinical Impression Statement  Pt toelrated myofacial release and MLD to RUE and  RUQ today with mild  pain in areas of cording- axilla, antecubital fossa and proximal to volar wrist. Cording iremains palpable and visible at all sites.No cords released today despite ongoing stretches. Cont as per POC    OT Occupational Profile and History  Comprehensive Assessment- Review of records and extensive additional review of physical, cognitive, psychosocial history related to current functional performance    Occupational performance deficits (Please refer to evaluation for details):  ADL's;IADL's;Social Participation;Leisure;Rest and Sleep;Work    Marketing executive / Function / Physical Skills  ADL;Decreased knowledge of precautions;ROM;UE functional use;Decreased knowledge of use of DME;Edema;Skin integrity;Pain;IADL    Rehab Potential  Good    Clinical Decision Making  Several treatment options, min-mod task modification necessary    Comorbidities Affecting Occupational Performance:  Presence of comorbidities impacting occupational performance    Comorbidities impacting occupational performance description:  see SUBJUECTIVE    Modification or Assistance to Complete Evaluation   Min-Moderate modification of tasks or assist with assess necessary to complete eval    OT Frequency  2x / week    OT Duration  12 weeks    OT Treatment/Interventions  Self-care/ADL training;Therapeutic exercise;Manual lymph drainage;Patient/family education;Therapeutic activities;DME and/or AE instruction;Manual Therapy;Other (comment)   consider fitting with prophylactic cmopression arm sleeve   Plan  BUE AROM ther ex to reduce axillary cording- 2 sets of 10 2-3 x daily    Consulted and Agree with Plan of Care  Patient       Patient will benefit from skilled therapeutic intervention in order to improve the following deficits and impairments:   Body Structure / Function / Physical Skills: ADL, Decreased knowledge of precautions, ROM, UE functional use, Decreased knowledge of use of DME, Edema, Skin integrity,  Pain, IADL       Visit Diagnosis: 1. Postmastectomy lymphedema syndrome       Problem List Patient Active Problem List   Diagnosis Date Noted  . Postoperative wound infection 03/13/2019  . Seroma of breast 03/11/2019  . Age-related osteoporosis without current pathological fracture 02/27/2019  . Malignant neoplasm of central portion of right breast in female, estrogen receptor positive ([place]) 02/12/2019  . Genetic testing 01/31/2019  . Malignant neoplasm of upper-inner quadrant of right breast in female, estrogen receptor positive ([place]) 01/15/2019  .  Long term current use of opiate analgesic 12/02/2018  . Tympanic membrane central perforation, left 11/04/2018  . DDD (degenerative disc disease), lumbosacral 10/10/2018  . Abnormal MRI, lumbar spine (08/29/2018) 10/10/2018  . 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 Lumbar nerve root impingement (Bilateral: L4 & L5) (L>R: L5) 10/10/2018  . Lumbar Spinal instability of L4-5 10/10/2018  . Lumbar facet hypertrophy 10/10/2018  . COPD (chronic obstructive pulmonary disease) with emphysema ([employer] [place]) 08/28/2018  . Elevated rheumatoid factor 08/13/2018  . Elevated C-reactive protein (CRP) 08/12/2018  . Elevated sed rate 08/12/2018  . Lumbar facet syndrome (Bilateral) 08/12/2018  . Back pain with left-sided radiculopathy 08/12/2018  . DDD (degenerative disc disease), lumbar 08/12/2018  . Chronic lower extremity pain (Primary Area of Pain) (Bilateral) (L>R) 07/23/2018  . Chronic low back pain (Secondary Area of Pain) (Bilateral) (L>R) w/ sciatica (Bilateral) 07/23/2018  . Chronic pain syndrome 07/23/2018  . Pharmacologic therapy 07/23/2018  . Disorder of skeletal system 07/23/2018  . Problems influencing health status 07/23/2018  . Chronic sacroiliac joint pain [facility] Area of Pain) (Bilateral) (L>R) 07/23/2018  . Status post lumboperitoneal shunt placement 02/26/2018  . 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 Tympanic membrane perforation 05/30/2016  . PMB (postmenopausal bleeding) 04/26/2016  . Rash 12/27/2015  . 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 Plantar fasciitis of foot (Left) 05/26/2015  . Spondylosis of lumbar region without myelopathy or radiculopathy 05/26/2015  . Lumbar foraminal stenosis (L3-4, L4-5, L5-S1) (L>R) 05/26/2015  . Spinal stenosis of lumbar region with neurogenic claudication 05/26/2015  . Encounter for screening mammogram for malignant neoplasm of breast 12/02/2014  . Goiter 12/02/2014  . Chronic idiopathic gout of foot (Right) 07/06/2014  . Biceps tendonitis (Right) 05/19/2014  . Shoulder pain (Right) 05/19/2014  . Facial weakness 05/05/2014  . Post herpetic neuralgia 05/05/2014  . Chronic dysfunction of left eustachian tube 03/24/2014  . Osteopenia 03/13/2014  . Lumbar Grade 1 Anterolisthesis of L3/4 (51mm) & L4/5 (4-75mm)(w/ Dynamic instability) 03/13/2014  . Family history of colon cancer 12/02/2013  . Dyshidrotic eczema 11/17/2013  . Diastasis recti 04/08/2013  . Facial nerve paresis 07/31/2012  . Blepharospasm 05/08/2012  . 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 Obesity 03/28/2012  . Hemoptysis 12/22/2011  . Neuropathic postherpetic trigeminal neuralgia 12/12/2011  . Allergic rhinitis, mild 10/12/2011  . Disequilibrium syndrome 09/27/2011  . Chronic bronchitis ([place]) 06/05/2011  . Essential hypertension 05/24/2011  . Type 2 diabetes mellitus without complications ([place]) 74/04/1447  . Pulmonary nodules 05/05/2011    Andrey Spearman, MS, OTR/L, [facility] 04/02/19 5:36 PM  [place] MAIN [facility] SERVICES [address] [place], [place], [address] Phone: [phone]   Fax:  [phone]  Name: Jaynell Castagnola MRN: [id number] Date of Birth: 1951/05/23

## 2019-04-02 NOTE — Therapy (Signed)
[place] MAIN [facility] SERVICES [address] [place], [place], [address] Phone: [phone]   Fax:  [phone]  Physical Therapy Treatment  Patient Details  Name: Susan Callahan MRN: [id number] Date of Birth: April 16, 1951 Referring Provider (PT): Francesco Sor   Encounter Date: 04/02/2019  PT End of Session - 04/02/19 1454    Visit Number  43    Number of Visits  46    Date for PT Re-Evaluation  04/14/19    Authorization Type  3/10 PN on 03/24/19    PT Start Time  1426    PT Stop Time  1510    PT Time Calculation (min)  44 min    Equipment Utilized During Treatment  Gait belt    Activity Tolerance  Patient tolerated treatment well    Behavior During Therapy  [facility] for tasks assessed/performed       Past Medical History:  Diagnosis Date  . COPD (chronic obstructive pulmonary disease) ([place])   . Depression 1974  . Diabetes mellitus without complication ([place])   . Hypertension   . Migraine   . 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 LUMBAR PERITONEAL SHUNT    . TYMPANOSTOMY TUBE PLACEMENT      There were no vitals filed for this visit.  Subjective Assessment - 04/02/19 1431    Subjective  Patient had her injection yesterday, felt relief but then overdid it gardening so has slight discomfort.    Pertinent History  Pain began in 2007 and has gradually got worse and has progressed to nonstop. Patient states pain last all day long, everyday with no change in symptoms. Medication helps briefly. States she has difficulty with showering and standing. States she has to stand with her knee bent due to pulling sensation in legs. Pain begins immediately when stands and begins to walk. Pain feels better when she bends her knees when standing. Pain became constant and non stop around Dec 2018. Denies bowel or bladder changes, unexplained weakness, or unexpected weight loss/gain. She currently goes  to pulmonary rehab 3x a week, but has increased pain with treadmill walking. Will walk with cane on occasion around house especially at night due to occasional dizziness and vision impairments in medical history. States she also has difficulty with carrying groceries up steps due to low back pain. She would like to be able to walk better and have less pain to be less reliant on medication.     Limitations  Lifting;Standing;Walking;House hold activities    How long can you stand comfortably?  pain begins immediately     How long can you walk comfortably?  pain begins immediately     Patient Stated Goals  walk better, have less pain    Currently in Pain?  Yes    Pain Score  2     Pain Location  Back    Pain Orientation  Left;Lower    Pain Descriptors / Indicators  Burning    Pain Type  Chronic pain    Pain Onset  More than a month ago    Pain Frequency  Intermittent          Manual Therapy  L/R single knee to chest stretch x 30s each LE; Hamstring stretch in supine with increasing range 60 seconds each LE Piriformis stretch R and L 30 second holds  Roller to lumbar and thoracic paraspinals x 3 minutes.   SAD in  figure 4 position with belt BLE 5x 20 seconds inferior direction Calf rollout with "the stick" 2 minutes each calf Hamstring rollout with " the stick" 2 minutes each calf    Ther-ex: cueing for sequencing, positioning and muscle activation  Hooklying HS stretch with DF/PF for sciatic nerve mobility x20 bilateral; Posterior pelvic tilts x15. tactile and verbal cue for appropriate technique 5 seconds with hold, improved alignment  posterior pelvic tilt with adduction squeeze in hooklying 15x 3 second holds  Posterior pelvic tilt with abduction GTB in hooklying 15x- L calf spasm   seated piriformis stretch 30 seconds each LE    Treadmill 2.0 mph, 0% incline 5 minutes. Cueing for abdominal activation and sequencing of muscle activation.     Pt educated throughout session about  proper posture and technique with exercises. Improved exercise technique, movement at target joints, use of target muscles after min to mod verbal, visual, tactile cues.                       PT Education - 04/02/19 1454    Education Details  exercise technique, manual    Person(s) Educated  Patient    Methods  Explanation;Demonstration;Tactile cues;Verbal cues    Comprehension  Verbalized understanding;Returned demonstration;Verbal cues required;Tactile cues required       PT Short Term Goals - 03/17/19 1447      PT SHORT TERM GOAL #1   Title  Patient will be independent with completion of HEP to improve ability to complete functional tasks and functional ADLs.    Baseline  10/23: will give HEP next session 11/19: HEP compliance    Time  2    Period  Weeks    Status  Achieved      PT SHORT TERM GOAL #2   Title  Patient will report pain score less than 4/10 on VAS to demonstrate reduction of pain levels with functional tasks.     Baseline  10/23: 6/10 11/19: 5/10 12/12: 9/10 1/10: 7/10  2/11: 3/10     Time  2    Period  Weeks    Status  Achieved        PT Long Term Goals - 03/17/19 1447      PT LONG TERM GOAL #1   Title  Patient will report pain score of <3/10 on VAS for decreased pain levels and ease with functional activities.    Baseline  10/23: 6/10 11/19: 5/10 12/12 9/10 1/10: 7/10 2/11: 3/10 3/9: 6/10  5/26: 8/10 03/17/19: 7/10    Time  4    Period  Weeks    Status  Partially Met    Target Date  04/14/19      PT LONG TERM GOAL #2   Title  Patient will be able to ambulate on treadmill for 84mnutes without increase in back/leg pain demonstrating improved community ambulation and treadmill walking at pulmonary rehab    Baseline  10/23: 047mutes increases pain 11/19: 105m24mtes 12/12: has not had the chance to perform 1/10: was able to do 5 minutes without pain 2/11: able to do 5 minutes then had to bend over 3/9: 5 minutes 30 seconds at 2.0 mphs 5/26: had  to walk 10 minutes at cancer center with extreme pain but was able to perform 7/6: walks 10 minutes with increase of pain by 2 points    Time  4    Period  Weeks    Status  Partially Met    Target  Date  04/14/19      PT LONG TERM GOAL #3   Title  Patient will have decreased modified oswestry score <50% for improved ability to complete household tasks with less pain levels.     Baseline  10/23: 62% 11/19: 42%      Time  4    Period  Weeks    Status  Achieved      PT LONG TERM GOAL #4   Title  Patient will score 4+/5 on BLE MMT to demonstrate improved glute strength and ability to carry groceries with increased ease up/down stairs at home.    Baseline  10/23: 4-/5 grossly 11/19: 4/5 grossly; hamstrings 4-/5 12/12: 4/5 gross 1/10: 4/5 gross 2/11: gross 4+/5 with hamstrings and abuductors 4-/5 3/9: gross 4+/5 hamstrings and abductors 4-/5 5/27: grossly 3+/5 with pain with LLE movements 7/6: grossly 4-/5 with pain with LLE movement    Time  4    Period  Weeks    Status  On-going    Target Date  04/14/19      PT LONG TERM GOAL #5   Title  Patient will have decreased modified oswestry score <35% for improved ability to complete household tasks with less pain levels.     Baseline  11/19: 42% 12/12: 52% 1/10: 48%  2/11: 30%     Time  4    Period  Weeks    Status  Achieved      PT LONG TERM GOAL #6   Title  Patient will be able to make and/or strip the bed without pain and without needing rest breaks to perform iADLs independently.     Baseline  2/11: pain limits ability to perform, ~4 breaks required 3/9: takes her time, 3 breaks 5/27: very painful still 7/6: requires only one rest break    Time  4    Period  Weeks    Status  Partially Met    Target Date  04/14/19      PT LONG TERM GOAL #7   Title  Patient will have decreased modified oswestry score <20% for improved ability to complete household tasks with less pain levels.     Baseline  2/11: 30%  3/9: 30% 5/27: 63% 7/6: 38%    Time   4    Period  Weeks    Status  On-going    Target Date  04/14/19            Plan - 04/02/19 1514    Clinical Impression Statement  Patient had one episode of L calf spasm that was reduced with combined manual and therex. Introduction back to ambulation on treadmill performed with patient tolerating 5 minutes with no incline.  Patient not able to tolerate prolonged ambulation while maintaining heel strike and upright posture. Patient will continue to benefit from skilled physical therapy to improve strength, to reduce pain levels with ADLs, and to improve quality of life.    Rehab Potential  Fair    Clinical Impairments Affecting Rehab Potential  (+) motivation (-) chronicity of symptoms    PT Frequency  2x / week    PT Duration  4 weeks    PT Treatment/Interventions  ADLs/Self Care Home Management;Cryotherapy;Moist Heat;Traction;Iontophoresis 63m/ml Dexamethasone;Ultrasound;Gait training;Stair training;Functional mobility training;Neuromuscular re-education;Balance training;Therapeutic exercise;Therapeutic activities;Patient/family education;Manual techniques;Energy conservation;Aquatic Therapy;Electrical Stimulation;Passive range of motion;Dry needling;Taping    PT Next Visit Plan  cross body coordination, progress core/postural stability, progress BLE strengthening    PT Home Exercise Plan  No updates  this day    Consulted and Agree with Plan of Care  Patient       Patient will benefit from skilled therapeutic intervention in order to improve the following deficits and impairments:  Abnormal gait, Cardiopulmonary status limiting activity, Decreased activity tolerance, Decreased balance, Decreased endurance, Decreased range of motion, Difficulty walking, Decreased strength, Hypomobility, Decreased mobility, Impaired perceived functional ability, Impaired flexibility, Postural dysfunction, Obesity, Improper body mechanics, Pain  Visit Diagnosis: 1. Chronic low back pain, unspecified back  pain laterality, unspecified whether sciatica present   2. Abnormal posture   3. Muscle weakness (generalized)   4. Other abnormalities of gait and mobility        Problem List Patient Active Problem List   Diagnosis Date Noted  . Postoperative wound infection 03/13/2019  . Seroma of breast 03/11/2019  . Age-related osteoporosis without current pathological fracture 02/27/2019  . Malignant neoplasm of central portion of right breast in female, estrogen receptor positive ([place]) 02/12/2019  . Genetic testing 01/31/2019  . Malignant neoplasm of upper-inner quadrant of right breast in female, estrogen receptor positive ([place]) 01/15/2019  . Long term current use of opiate analgesic 12/02/2018  . Tympanic membrane central perforation, left 11/04/2018  . DDD (degenerative disc disease), lumbosacral 10/10/2018  . Abnormal MRI, lumbar spine (08/29/2018) 10/10/2018  . 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 Lumbar nerve root impingement (Bilateral: L4 & L5) (L>R: L5) 10/10/2018  . Lumbar Spinal instability of L4-5 10/10/2018  . Lumbar facet hypertrophy 10/10/2018  . COPD (chronic obstructive pulmonary disease) with emphysema ([place]) 08/28/2018  . Elevated rheumatoid factor 08/13/2018  . Elevated C-reactive protein (CRP) 08/12/2018  . Elevated sed rate 08/12/2018  . Lumbar facet syndrome (Bilateral) 08/12/2018  . Back pain with left-sided radiculopathy 08/12/2018  . DDD (degenerative disc disease), lumbar 08/12/2018  . Chronic lower extremity pain (Primary Area of Pain) (Bilateral) (L>R) 07/23/2018  . Chronic low back pain (Secondary Area of Pain) (Bilateral) (L>R) w/ sciatica (Bilateral) 07/23/2018  . Chronic pain syndrome 07/23/2018  . Pharmacologic therapy 07/23/2018  . Disorder of skeletal system 07/23/2018  . Problems influencing health status 07/23/2018  . Chronic sacroiliac joint pain [facility] Area of Pain) (Bilateral) (L>R) 07/23/2018  .  Status post lumboperitoneal shunt placement 02/26/2018  . 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 Tympanic membrane perforation 05/30/2016  . PMB (postmenopausal bleeding) 04/26/2016  . Rash 12/27/2015  . 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 Plantar fasciitis of foot (Left) 05/26/2015  . Spondylosis of lumbar region without myelopathy or radiculopathy 05/26/2015  . Lumbar foraminal stenosis (L3-4, L4-5, L5-S1) (L>R) 05/26/2015  . Spinal stenosis of lumbar region with neurogenic claudication 05/26/2015  . Encounter for screening mammogram for malignant neoplasm of breast 12/02/2014  . Goiter 12/02/2014  . Chronic idiopathic gout of foot (Right) 07/06/2014  . Biceps tendonitis (Right) 05/19/2014  . Shoulder pain (Right) 05/19/2014  . Facial weakness 05/05/2014  . Post herpetic neuralgia 05/05/2014  . Chronic dysfunction of left eustachian tube 03/24/2014  . Osteopenia 03/13/2014  . Lumbar Grade 1 Anterolisthesis of L3/4 (31m) & L4/5 (4-141m(w/ Dynamic instability) 03/13/2014  . Family history of colon cancer 12/02/2013  . Dyshidrotic eczema 11/17/2013  . Diastasis recti 04/08/2013  . Facial nerve paresis 07/31/2012  . Blepharospasm 05/08/2012  . 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 Obesity 03/28/2012  . Hemoptysis 12/22/2011  . Neuropathic  postherpetic trigeminal neuralgia 12/12/2011  . Allergic rhinitis, mild 10/12/2011  . Disequilibrium syndrome 09/27/2011  . Chronic bronchitis ([place]) 06/05/2011  . Essential hypertension 05/24/2011  . Type 2 diabetes mellitus without complications ([place]) 09/79/4997  . Pulmonary nodules 05/05/2011   Janna Arch, PT, DPT   04/02/2019, 3:15 PM  [place] MAIN [facility] SERVICES [address] [place], [place], [address] Phone: [phone]   Fax:  [phone]  Name: Susan Callahan MRN: [id number] Date of Birth: 06/22/1951

## 2019-04-03 ENCOUNTER — Other Ambulatory Visit: Payer: Self-pay

## 2019-04-03 ENCOUNTER — Ambulatory Visit: Payer: Medicare Other | Admitting: Occupational Therapy

## 2019-04-03 DIAGNOSIS — I972 Postmastectomy lymphedema syndrome: Secondary | ICD-10-CM

## 2019-04-03 DIAGNOSIS — M545 Low back pain: Secondary | ICD-10-CM | POA: Diagnosis not present

## 2019-04-03 NOTE — Therapy (Signed)
[place] MAIN [facility] SERVICES [address] [place], [place], [address] Phone: [phone]   Fax:  [phone]  Occupational Therapy Treatment  Patient Details  Name: Susan Callahan MRN: [id number] Date of Birth: 1951/09/06 Referring Provider (OT): Humberto Leep, MD   Encounter Date: 04/03/2019  OT End of Session - 04/03/19 6270    Visit Number  9    Number of Visits  36    Date for OT Re-Evaluation  05/20/19    OT Start Time  0115    OT Stop Time  0215    OT Time Calculation (min)  60 min    Activity Tolerance  Patient tolerated treatment well;No increased pain    Behavior During Therapy  WFL for tasks assessed/performed       Past Medical History:  Diagnosis Date  . COPD (chronic obstructive pulmonary disease) ([place])   . Depression 1974  . Diabetes mellitus without complication ([place])   . Hypertension   . Migraine   . 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 LUMBAR PERITONEAL SHUNT    . TYMPANOSTOMY TUBE PLACEMENT      There were no vitals filed for this visit.  Subjective Assessment - 04/03/19 1434    Subjective   Pt presents for OT visit 9/ 36 to address RUE/ RUQ post-mastectomy swelling and axillary web syndrome. Pt expresses concern re increased irritation in R axilla again today.    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 01/30/19 partial mastectomy. Margins clear. 0/2 LN  negative for  disease. actinic keritosis,DDD, DM, obesity, thyroid nodules, pulmonary nodules, chronic back pain, HTN, COPD, hx depression    Limitations  pain and swelling in R axilla and arm, chronic back pain, muscle weakness, impaired balance, decreased RUE AROM, abnormal gait, difficulty walking, impaired functional mobility and transfers,    Repetition  Increases Symptoms    Special Tests  -Stemmer at MPs bilaterally    Patient Stated Goals  relduce  pain and improve movement  in my R arm so I can do things I need to do. reduce risk of lymphedema    Pain Onset  More than a month ago    Pain Onset  More than a month ago                   OT Treatments/Exercises (OP) - 04/03/19 0001      ADLs   ADL Education Given  Yes      Manual Therapy   Manual Therapy  Edema management;Manual Lymphatic Drainage (MLD);Myofascial release    Manual therapy comments   increased redness at R axilla in area of surgical scar with palpable increase in density at previous seroma site. Swelling all along medial border of scar. Pt reports mild increase in tenderness.    Myofascial Release  R volar forearm and arm as established.    Manual Lymphatic Drainage (MLD)  MLD to RUE/ RUQ  utilizing ipsilateral axillo-inguinal watershed, and anterior and posterior axillary anastamoses. Pt tolerated well in supine and sidelying. No myofacial release today.             OT Education - 04/03/19 1437    Education Details  Cont Pt edu for therapeutic exercise and activities for sustained stretch to axillary cording    Person(s) Educated  Patient    Methods  Demonstration;Explanation;Tactile cues;Verbal cues;Handout  Comprehension  Verbalized understanding;Returned demonstration          OT Long Term Goals - 02/20/19 1650      OT LONG TERM GOAL #1   Title  Pt independent with lymphedema precautions and prevention strategies to limit LE progression.    Baseline  Max A    Time  4    Period  Days    Status  New    Target Date  --   4th OT Rx visit     OT LONG TERM GOAL #2   Title  Pt will be independent with lymphedema self-care home program and AWS ther ex to improve functional performance of basic and instrumental ADLs by DC.    Baseline  Max A    Time  12    Period  Weeks    Status  New    Target Date  05/20/19   4th OT Rx visit     OT LONG TERM GOAL #3   Title  Pt wil report 0/10 pain with RUE functional use ( reaching, lifting,  carrying, dressing, dressing, etc) to achieve independent , pain free ADLs performance.    Baseline  Max A    Time  12    Period  Weeks    Status  New    Target Date  05/20/19            Plan - 04/03/19 1437    Clinical Impression Statement  Signs and symptoms including increased redness, tenserness and swelling at axillary seroma site . Pt instructed to call surgeon's office and report symptoms appears to re reoccurence of previous infection,. Pt toelrated myofacial release and MLD to RUE and RUQ today with mild  pain in areas of cording. No strokes in R axilla due to infection signs/ symptoms.No cords released but stretches appear to be effective as evidenced by improved AROM ibns all shoulder planes and decreased pain at end ranges.  Cont as per POC    OT Occupational Profile and History  Comprehensive Assessment- Review of records and extensive additional review of physical, cognitive, psychosocial history related to current functional performance    Occupational performance deficits (Please refer to evaluation for details):  ADL's;IADL's;Social Participation;Leisure;Rest and Sleep;Work    Marketing executive / Function / Physical Skills  ADL;Decreased knowledge of precautions;ROM;UE functional use;Decreased knowledge of use of DME;Edema;Skin integrity;Pain;IADL    Rehab Potential  Good    Clinical Decision Making  Several treatment options, min-mod task modification necessary    Comorbidities Affecting Occupational Performance:  Presence of comorbidities impacting occupational performance    Comorbidities impacting occupational performance description:  see SUBJUECTIVE    Modification or Assistance to Complete Evaluation   Min-Moderate modification of tasks or assist with assess necessary to complete eval    OT Frequency  2x / week    OT Duration  12 weeks    OT Treatment/Interventions  Self-care/ADL training;Therapeutic exercise;Manual lymph drainage;Patient/family education;Therapeutic  activities;DME and/or AE instruction;Manual Therapy;Other (comment)   consider fitting with prophylactic cmopression arm sleeve   Plan  BUE AROM ther ex to reduce axillary cording- 2 sets of 10 2-3 x daily    Consulted and Agree with Plan of Care  Patient       Patient will benefit from skilled therapeutic intervention in order to improve the following deficits and impairments:   Body Structure / Function / Physical Skills: ADL, Decreased knowledge of precautions, ROM, UE functional use, Decreased knowledge of use of DME, Edema, Skin  integrity, Pain, IADL       Visit Diagnosis: 1. Postmastectomy lymphedema syndrome       Problem List Patient Active Problem List   Diagnosis Date Noted  . Postoperative wound infection 03/13/2019  . Seroma of breast 03/11/2019  . Age-related osteoporosis without current pathological fracture 02/27/2019  . Malignant neoplasm of central portion of right breast in female, estrogen receptor positive ([place]) 02/12/2019  . Genetic testing 01/31/2019  . Malignant neoplasm of upper-inner quadrant of right breast in female, estrogen receptor positive ([place]) 01/15/2019  . Long term current use of opiate analgesic 12/02/2018  . Tympanic membrane central perforation, left 11/04/2018  . DDD (degenerative disc disease), lumbosacral 10/10/2018  . Abnormal MRI, lumbar spine (08/29/2018) 10/10/2018  . 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 Lumbar nerve root impingement (Bilateral: L4 & L5) (L>R: L5) 10/10/2018  . Lumbar Spinal instability of L4-5 10/10/2018  . Lumbar facet hypertrophy 10/10/2018  . COPD (chronic obstructive pulmonary disease) with emphysema ([place]) 08/28/2018  . Elevated rheumatoid factor 08/13/2018  . Elevated C-reactive protein (CRP) 08/12/2018  . Elevated sed rate 08/12/2018  . Lumbar facet syndrome (Bilateral) 08/12/2018  . Back pain with left-sided radiculopathy 08/12/2018  . DDD (degenerative disc  disease), lumbar 08/12/2018  . Chronic lower extremity pain (Primary Area of Pain) (Bilateral) (L>R) 07/23/2018  . Chronic low back pain (Secondary Area of Pain) (Bilateral) (L>R) w/ sciatica (Bilateral) 07/23/2018  . Chronic pain syndrome 07/23/2018  . Pharmacologic therapy 07/23/2018  . Disorder of skeletal system 07/23/2018  . Problems influencing health status 07/23/2018  . Chronic sacroiliac joint pain [facility] Area of Pain) (Bilateral) (L>R) 07/23/2018  . Status post lumboperitoneal shunt placement 02/26/2018  . 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 Tympanic membrane perforation 05/30/2016  . PMB (postmenopausal bleeding) 04/26/2016  . Rash 12/27/2015  . 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 Plantar fasciitis of foot (Left) 05/26/2015  . Spondylosis of lumbar region without myelopathy or radiculopathy 05/26/2015  . Lumbar foraminal stenosis (L3-4, L4-5, L5-S1) (L>R) 05/26/2015  . Spinal stenosis of lumbar region with neurogenic claudication 05/26/2015  . Encounter for screening mammogram for malignant neoplasm of breast 12/02/2014  . Goiter 12/02/2014  . Chronic idiopathic gout of foot (Right) 07/06/2014  . Biceps tendonitis (Right) 05/19/2014  . Shoulder pain (Right) 05/19/2014  . Facial weakness 05/05/2014  . Post herpetic neuralgia 05/05/2014  . Chronic dysfunction of left eustachian tube 03/24/2014  . Osteopenia 03/13/2014  . Lumbar Grade 1 Anterolisthesis of L3/4 (3mm) & L4/5 (4-77mm)(w/ Dynamic instability) 03/13/2014  . Family history of colon cancer 12/02/2013  . Dyshidrotic eczema 11/17/2013  . Diastasis recti 04/08/2013  . Facial nerve paresis 07/31/2012  . Blepharospasm 05/08/2012  . 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 Obesity 03/28/2012  . Hemoptysis 12/22/2011  . Neuropathic postherpetic trigeminal neuralgia 12/12/2011  . Allergic  rhinitis, mild 10/12/2011  . Disequilibrium syndrome 09/27/2011  . Chronic bronchitis ([place]) 06/05/2011  . Essential hypertension 05/24/2011  . Type 2 diabetes mellitus without complications ([place]) 38/18/2993  . Pulmonary nodules 05/05/2011    Andrey Spearman, MS, OTR/L, [facility] 04/03/19 2:41 PM   [place] MAIN [facility] SERVICES [address] [place], [place], [address] Phone: [phone]   Fax:  [phone]  Name: Djuana Littleton MRN: [id number] Date of Birth: 01-21-1951

## 2019-04-07 ENCOUNTER — Ambulatory Visit: Payer: Medicare Other

## 2019-04-07 ENCOUNTER — Ambulatory Visit: Payer: Medicare Other | Admitting: Occupational Therapy

## 2019-04-07 ENCOUNTER — Other Ambulatory Visit: Payer: Self-pay

## 2019-04-07 DIAGNOSIS — I972 Postmastectomy lymphedema syndrome: Secondary | ICD-10-CM

## 2019-04-07 DIAGNOSIS — M545 Low back pain: Secondary | ICD-10-CM | POA: Diagnosis not present

## 2019-04-07 NOTE — Therapy (Signed)
[place] MAIN [facility] SERVICES [address] [place], [place], [address] Phone: [phone]   Fax:  [phone]  Occupational Therapy Treatment Note and Progress Report  Patient Details  Name: Susan Callahan MRN: [id number] Date of Birth: 22-Feb-1951 Referring Provider (OT): Humberto Leep, MD   Encounter Date: 04/07/2019  OT End of Session - 04/07/19 1738    Visit Number  10    Number of Visits  36    Date for OT Re-Evaluation  05/20/19    OT Start Time  0320    OT Stop Time  0420    OT Time Calculation (min)  60 min    Activity Tolerance  Patient tolerated treatment well;No increased pain    Behavior During Therapy  WFL for tasks assessed/performed       Past Medical History:  Diagnosis Date  . COPD (chronic obstructive pulmonary disease) ([place])   . Depression 1974  . Diabetes mellitus without complication ([place])   . Hypertension   . Migraine   . 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 LUMBAR PERITONEAL SHUNT    . TYMPANOSTOMY TUBE PLACEMENT      There were no vitals filed for this visit.  Subjective Assessment - 04/07/19 1734    Subjective   Pt presents for OT visit 10/ 36 to address RUE/ RUQ post-mastectomy swelling and axillary web syndrome. Pt reports reduced pain and discomfort with functional arm use , but reports ongoing discomfort in R axilla at surgical site and ongoing pain/ discomfort in R volar arm and forearm with reaching   carrying  and lifting activities requiring abdunction   and flexion with full wriost and elbow extension.    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 01/30/19 partial mastectomy. Margins clear. 0/2 LN  negative for  disease. actinic keritosis,DDD, DM, obesity, thyroid nodules, pulmonary nodules, chronic back pain, HTN, COPD, hx depression    Limitations  pain and swelling in R axilla and arm, chronic  back pain, muscle weakness, impaired balance, decreased RUE AROM, abnormal gait, difficulty walking, impaired functional mobility and transfers,    Repetition  Increases Symptoms    Special Tests  -Stemmer at MPs bilaterally    Patient Stated Goals  relduce pain and improve movement  in my R arm so I can do things I need to do. reduce risk of lymphedema    Pain Onset  More than a month ago    Pain Onset  More than a month ago                   OT Treatments/Exercises (OP) - 04/07/19 0001      ADLs   ADL Education Given  Yes      Manual Therapy   Manual Therapy  Edema management;Manual Lymphatic Drainage (MLD);Myofascial release    Manual therapy comments  seroma site is less dense    today and surrounding swelling decreased significantly. Mild redness remains at scar. Density of seroma is decreased compared with last visit  4 days ago.    Myofascial Release  R volar forearm and arm as established.    Manual Lymphatic Drainage (MLD)  MLD to RUE/ RUQ  utilizing ipsilateral axillo-inguinal watershed, and anterior and posterior axillary anastamoses. Pt tolerated well in supine and sidelying. No myofacial release today.  OT Education - 04/07/19 1738    Education Details  Cont Pt edu for therapeutic exercise and activities for sustained stretch to axillary cording    Person(s) Educated  Patient    Methods  Demonstration;Explanation;Tactile cues;Verbal cues;Handout    Comprehension  Verbalized understanding;Returned demonstration          OT Long Term Goals - 04/07/19 1741      OT LONG TERM GOAL #1   Title  Pt independent with lymphedema precautions and prevention strategies to limit LE progression.    Baseline  Max A    Time  4    Period  Days    Status  Achieved      OT LONG TERM GOAL #2   Title  Pt will be independent with lymphedema self-care home program and AWS ther ex to improve functional performance of basic and instrumental ADLs by DC.     Baseline  Max A    Time  12    Period  Weeks    Status  Achieved      OT LONG TERM GOAL #3   Title  Pt wil report 0/10 pain with RUE functional use ( reaching, lifting, carrying, dressing, dressing, etc) to achieve independent , pain free ADLs performance.    Baseline  Max A    Time  12    Period  Weeks    Status  New            Plan - 04/07/19 1739    Clinical Impression Statement  L volar forearm cord released today with myofacial release  was both audible and palpable. Pt reported decreased pain at end range of overhead reaching after session. Pt demonstrates steady progress towards all OT goals. She isi independent with LE precautions and prevention strategies and princiopals. She is independent and complaint  with sll home program compenents, including flexibility stretches, scar massage and AWS ther ex. Pt has regained full RUE AROM at shoulder, elbow and wrist, and despite ~50% reductionin pain 2/2 axillary cording, pain persists at volare forarm , arm and in axilla at end ranges. Cont as per POC.    OT Occupational Profile and History  Comprehensive Assessment- Review of records and extensive additional review of physical, cognitive, psychosocial history related to current functional performance    Occupational performance deficits (Please refer to evaluation for details):  ADL's;IADL's;Social Participation;Leisure;Rest and Sleep;Work    Marketing executive / Function / Physical Skills  ADL;Decreased knowledge of precautions;ROM;UE functional use;Decreased knowledge of use of DME;Edema;Skin integrity;Pain;IADL    Rehab Potential  Good    Clinical Decision Making  Several treatment options, min-mod task modification necessary    Comorbidities Affecting Occupational Performance:  Presence of comorbidities impacting occupational performance    Comorbidities impacting occupational performance description:  see SUBJUECTIVE    Modification or Assistance to Complete Evaluation   Min-Moderate  modification of tasks or assist with assess necessary to complete eval    OT Frequency  2x / week    OT Duration  12 weeks    OT Treatment/Interventions  Self-care/ADL training;Therapeutic exercise;Manual lymph drainage;Patient/family education;Therapeutic activities;DME and/or AE instruction;Manual Therapy;Other (comment)   consider fitting with prophylactic cmopression arm sleeve   Plan  BUE AROM ther ex to reduce axillary cording- 2 sets of 10 2-3 x daily    Consulted and Agree with Plan of Care  Patient       Patient will benefit from skilled therapeutic intervention in order to improve the following deficits and  impairments:   Body Structure / Function / Physical Skills: ADL, Decreased knowledge of precautions, ROM, UE functional use, Decreased knowledge of use of DME, Edema, Skin integrity, Pain, IADL       Visit Diagnosis: 1. Post-mastectomy lymphedema syndrome       Problem List Patient Active Problem List   Diagnosis Date Noted  . Postoperative wound infection 03/13/2019  . Seroma of breast 03/11/2019  . Age-related osteoporosis without current pathological fracture 02/27/2019  . Malignant neoplasm of central portion of right breast in female, estrogen receptor positive ([place]) 02/12/2019  . Genetic testing 01/31/2019  . Malignant neoplasm of upper-inner quadrant of right breast in female, estrogen receptor positive ([place]) 01/15/2019  . Long term current use of opiate analgesic 12/02/2018  . Tympanic membrane central perforation, left 11/04/2018  . DDD (degenerative disc disease), lumbosacral 10/10/2018  . Abnormal MRI, lumbar spine (08/29/2018) 10/10/2018  . 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 Lumbar nerve root impingement (Bilateral: L4 & L5) (L>R: L5) 10/10/2018  . Lumbar Spinal instability of L4-5 10/10/2018  . Lumbar facet hypertrophy 10/10/2018  . COPD (chronic obstructive pulmonary disease) with emphysema ([place])  08/28/2018  . Elevated rheumatoid factor 08/13/2018  . Elevated C-reactive protein (CRP) 08/12/2018  . Elevated sed rate 08/12/2018  . Lumbar facet syndrome (Bilateral) 08/12/2018  . Back pain with left-sided radiculopathy 08/12/2018  . DDD (degenerative disc disease), lumbar 08/12/2018  . Chronic lower extremity pain (Primary Area of Pain) (Bilateral) (L>R) 07/23/2018  . Chronic low back pain (Secondary Area of Pain) (Bilateral) (L>R) w/ sciatica (Bilateral) 07/23/2018  . Chronic pain syndrome 07/23/2018  . Pharmacologic therapy 07/23/2018  . Disorder of skeletal system 07/23/2018  . Problems influencing health status 07/23/2018  . Chronic sacroiliac joint pain [facility] Area of Pain) (Bilateral) (L>R) 07/23/2018  . Status post lumboperitoneal shunt placement 02/26/2018  . 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 Tympanic membrane perforation 05/30/2016  . PMB (postmenopausal bleeding) 04/26/2016  . Rash 12/27/2015  . 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 Plantar fasciitis of foot (Left) 05/26/2015  . Spondylosis of lumbar region without myelopathy or radiculopathy 05/26/2015  . Lumbar foraminal stenosis (L3-4, L4-5, L5-S1) (L>R) 05/26/2015  . Spinal stenosis of lumbar region with neurogenic claudication 05/26/2015  . Encounter for screening mammogram for malignant neoplasm of breast 12/02/2014  . Goiter 12/02/2014  . Chronic idiopathic gout of foot (Right) 07/06/2014  . Biceps tendonitis (Right) 05/19/2014  . Shoulder pain (Right) 05/19/2014  . Facial weakness 05/05/2014  . Post herpetic neuralgia 05/05/2014  . Chronic dysfunction of left eustachian tube 03/24/2014  . Osteopenia 03/13/2014  . Lumbar Grade 1 Anterolisthesis of L3/4 (35mm) & L4/5 (4-21mm)(w/ Dynamic instability) 03/13/2014  . Family history of colon cancer 12/02/2013  . Dyshidrotic eczema 11/17/2013  . Diastasis recti 04/08/2013  . Facial  nerve paresis 07/31/2012  . Blepharospasm 05/08/2012  . 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 Obesity 03/28/2012  . Hemoptysis 12/22/2011  . Neuropathic postherpetic trigeminal neuralgia 12/12/2011  . Allergic rhinitis, mild 10/12/2011  . Disequilibrium syndrome 09/27/2011  . Chronic bronchitis ([place]) 06/05/2011  . Essential hypertension 05/24/2011  . Type 2 diabetes mellitus without complications ([place]) 29/93/7169  . Pulmonary nodules 05/05/2011    Andrey Spearman, MS, OTR/L, [facility] 04/07/19 5:48 PM  [place] MAIN [facility] SERVICES [address] [place], [place], [address]  Phone: [phone]   Fax:  [phone]  Name: Susan Callahan MRN: [id number] Date of Birth: 06/28/1951

## 2019-04-09 ENCOUNTER — Ambulatory Visit: Payer: Medicare Other

## 2019-04-09 ENCOUNTER — Other Ambulatory Visit: Payer: Self-pay

## 2019-04-09 ENCOUNTER — Ambulatory Visit: Payer: Medicare Other | Admitting: Occupational Therapy

## 2019-04-09 DIAGNOSIS — G8929 Other chronic pain: Secondary | ICD-10-CM

## 2019-04-09 DIAGNOSIS — R293 Abnormal posture: Secondary | ICD-10-CM

## 2019-04-09 DIAGNOSIS — M545 Low back pain, unspecified: Secondary | ICD-10-CM

## 2019-04-09 DIAGNOSIS — I972 Postmastectomy lymphedema syndrome: Secondary | ICD-10-CM

## 2019-04-09 DIAGNOSIS — M6281 Muscle weakness (generalized): Secondary | ICD-10-CM

## 2019-04-09 NOTE — Therapy (Signed)
[place] MAIN [facility] SERVICES [address] [place], [place], [address] Phone: [phone]   Fax:  [phone]  Physical Therapy Treatment  Patient Details  Name: Susan Callahan MRN: [id number] Date of Birth: 1951/04/06 Referring Provider (PT): Francesco Sor   Encounter Date: 04/09/2019  PT End of Session - 04/09/19 1501    Visit Number  44    Number of Visits  46    Date for PT Re-Evaluation  04/14/19    Authorization Type  4/10 PN on 03/24/19    PT Start Time  1430    PT Stop Time  1514    PT Time Calculation (min)  44 min    Equipment Utilized During Treatment  Gait belt    Activity Tolerance  Patient tolerated treatment well    Behavior During Therapy  [facility] for tasks assessed/performed       Past Medical History:  Diagnosis Date  . COPD (chronic obstructive pulmonary disease) ([place])   . Depression 1974  . Diabetes mellitus without complication ([place])   . Hypertension   . Migraine   . 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 LUMBAR PERITONEAL SHUNT    . TYMPANOSTOMY TUBE PLACEMENT      There were no vitals filed for this visit.  Subjective Assessment - 04/09/19 1434    Subjective  Patient is having difficulty with her inner ear. Feeling 4/10 dizziness with additional eye watering (L ear). Back pain is tolerable today. Has been compliant with HEP.    Pertinent History  Pain began in 2007 and has gradually got worse and has progressed to nonstop. Patient states pain last all day long, everyday with no change in symptoms. Medication helps briefly. States she has difficulty with showering and standing. States she has to stand with her knee bent due to pulling sensation in legs. Pain begins immediately when stands and begins to walk. Pain feels better when she bends her knees when standing. Pain became constant and non stop around Dec 2018. Denies bowel or bladder changes,  unexplained weakness, or unexpected weight loss/gain. She currently goes to pulmonary rehab 3x a week, but has increased pain with treadmill walking. Will walk with cane on occasion around house especially at night due to occasional dizziness and vision impairments in medical history. States she also has difficulty with carrying groceries up steps due to low back pain. She would like to be able to walk better and have less pain to be less reliant on medication.     Limitations  Lifting;Standing;Walking;House hold activities    How long can you stand comfortably?  pain begins immediately     How long can you walk comfortably?  pain begins immediately     Patient Stated Goals  walk better, have less pain    Currently in Pain?  Yes    Pain Score  3     Pain Location  Back    Pain Orientation  Left;Lower    Pain Descriptors / Indicators  Burning    Pain Type  Chronic pain    Pain Radiating Towards  both legs to mid calf    Pain Onset  More than a month ago    Pain Frequency  Intermittent        Manual Therapy  L/R single knee to chest stretch x 30s each LE; Hamstring stretch in supine with increasing range 60 seconds each LE Piriformis  stretch R and L 30 second holds  Roller to lumbar and thoracic paraspinals x 3 minutes.   SAD in figure 4 position with belt BLE 5x 20 seconds inferior direction Calf rollout with "the stick" 2 minutes each calf Hamstring rollout with " the stick" 2 minutes each calf    Ther-ex: cueing for sequencing, positioning and muscle activation  Hooklying HS stretch with DF/PF for sciatic nerve mobility x20 bilateral; Posterior pelvic tilts x15. tactile and verbal cue for appropriate technique 5 seconds with hold, improved alignment  posterior pelvic tilt with adduction squeeze in hooklying 15x 3 second holds  Posterior pelvic tilt with abduction GTB in hooklying 15x  seated piriformis stretch 30 seconds each LE  swiss ball forward rollout 10x 10 second holds  forward Swiss ball lateral/diagonal rollout 10x 10 second holds each direction (L and R)    Treadmill: terminated due to inner ear difficulty   Pt educated throughout session about proper posture and technique with exercises. Improved exercise technique, movement at target joints, use of target muscles after min to mod verbal, visual, tactile cues.                         PT Education - 04/09/19 1501    Education Details  exercise technique, manual, posterior pelvic tilt    Person(s) Educated  Patient    Methods  Explanation;Demonstration;Tactile cues;Verbal cues    Comprehension  Verbalized understanding;Returned demonstration;Verbal cues required;Tactile cues required       PT Short Term Goals - 03/17/19 1447      PT SHORT TERM GOAL #1   Title  Patient will be independent with completion of HEP to improve ability to complete functional tasks and functional ADLs.    Baseline  10/23: will give HEP next session 11/19: HEP compliance    Time  2    Period  Weeks    Status  Achieved      PT SHORT TERM GOAL #2   Title  Patient will report pain score less than 4/10 on VAS to demonstrate reduction of pain levels with functional tasks.     Baseline  10/23: 6/10 11/19: 5/10 12/12: 9/10 1/10: 7/10  2/11: 3/10     Time  2    Period  Weeks    Status  Achieved        PT Long Term Goals - 03/17/19 1447      PT LONG TERM GOAL #1   Title  Patient will report pain score of <3/10 on VAS for decreased pain levels and ease with functional activities.    Baseline  10/23: 6/10 11/19: 5/10 12/12 9/10 1/10: 7/10 2/11: 3/10 3/9: 6/10  5/26: 8/10 03/17/19: 7/10    Time  4    Period  Weeks    Status  Partially Met    Target Date  04/14/19      PT LONG TERM GOAL #2   Title  Patient will be able to ambulate on treadmill for 1mnutes without increase in back/leg pain demonstrating improved community ambulation and treadmill walking at pulmonary rehab    Baseline  10/23: 071mutes  increases pain 11/19: [id number] 12/12: has not had the chance to perform 1/10: was able to do 5 minutes without pain 2/11: able to do 5 minutes then had to bend over 3/9: 5 minutes 30 seconds at 2.0 mphs 5/26: had to walk 10 minutes at cancer center with extreme pain but was able to perform  7/6: walks 10 minutes with increase of pain by 2 points    Time  4    Period  Weeks    Status  Partially Met    Target Date  04/14/19      PT LONG TERM GOAL #3   Title  Patient will have decreased modified oswestry score <50% for improved ability to complete household tasks with less pain levels.     Baseline  10/23: 62% 11/19: 42%      Time  4    Period  Weeks    Status  Achieved      PT LONG TERM GOAL #4   Title  Patient will score 4+/5 on BLE MMT to demonstrate improved glute strength and ability to carry groceries with increased ease up/down stairs at home.    Baseline  10/23: 4-/5 grossly 11/19: 4/5 grossly; hamstrings 4-/5 12/12: 4/5 gross 1/10: 4/5 gross 2/11: gross 4+/5 with hamstrings and abuductors 4-/5 3/9: gross 4+/5 hamstrings and abductors 4-/5 5/27: grossly 3+/5 with pain with LLE movements 7/6: grossly 4-/5 with pain with LLE movement    Time  4    Period  Weeks    Status  On-going    Target Date  04/14/19      PT LONG TERM GOAL #5   Title  Patient will have decreased modified oswestry score <35% for improved ability to complete household tasks with less pain levels.     Baseline  11/19: 42% 12/12: 52% 1/10: 48%  2/11: 30%     Time  4    Period  Weeks    Status  Achieved      PT LONG TERM GOAL #6   Title  Patient will be able to make and/or strip the bed without pain and without needing rest breaks to perform iADLs independently.     Baseline  2/11: pain limits ability to perform, ~4 breaks required 3/9: takes her time, 3 breaks 5/27: very painful still 7/6: requires only one rest break    Time  4    Period  Weeks    Status  Partially Met    Target Date  04/14/19      PT LONG  TERM GOAL #7   Title  Patient will have decreased modified oswestry score <20% for improved ability to complete household tasks with less pain levels.     Baseline  2/11: 30%  3/9: 30% 5/27: 63% 7/6: 38%    Time  4    Period  Weeks    Status  On-going    Target Date  04/14/19            Plan - 04/09/19 1502    Clinical Impression Statement  Patient presents with inner ear difficulty today limiting standing interventions. Patient continues to have reduced symptoms s/p session with improved gait mechanics and decreased radiating symptoms. Patient educated on need for strengthening and lengthening musculature to maintain pain free mobility for prolonged periods of time. Patient will continue to benefit from skilled physical therapy to improve strength, to reduce pain levels with ADLs, and to improve quality of life.    Rehab Potential  Fair    Clinical Impairments Affecting Rehab Potential  (+) motivation (-) chronicity of symptoms    PT Frequency  2x / week    PT Duration  4 weeks    PT Treatment/Interventions  ADLs/Self Care Home Management;Cryotherapy;Moist Heat;Traction;Iontophoresis 59m/ml Dexamethasone;Ultrasound;Gait training;Stair training;Functional mobility training;Neuromuscular re-education;Balance training;Therapeutic exercise;Therapeutic activities;Patient/family education;Manual techniques;Energy conservation;Aquatic  Therapy;Electrical Stimulation;Passive range of motion;Dry needling;Taping    PT Next Visit Plan  goals    PT Home Exercise Plan  No updates this day    Consulted and Agree with Plan of Care  Patient       Patient will benefit from skilled therapeutic intervention in order to improve the following deficits and impairments:  Abnormal gait, Cardiopulmonary status limiting activity, Decreased activity tolerance, Decreased balance, Decreased endurance, Decreased range of motion, Difficulty walking, Decreased strength, Hypomobility, Decreased mobility, Impaired  perceived functional ability, Impaired flexibility, Postural dysfunction, Obesity, Improper body mechanics, Pain  Visit Diagnosis: 1. Chronic low back pain, unspecified back pain laterality, unspecified whether sciatica present   2. Abnormal posture   3. Muscle weakness (generalized)        Problem List Patient Active Problem List   Diagnosis Date Noted  . Postoperative wound infection 03/13/2019  . Seroma of breast 03/11/2019  . Age-related osteoporosis without current pathological fracture 02/27/2019  . Malignant neoplasm of central portion of right breast in female, estrogen receptor positive ([place]) 02/12/2019  . Genetic testing 01/31/2019  . Malignant neoplasm of upper-inner quadrant of right breast in female, estrogen receptor positive ([place]) 01/15/2019  . Long term current use of opiate analgesic 12/02/2018  . Tympanic membrane central perforation, left 11/04/2018  . DDD (degenerative disc disease), lumbosacral 10/10/2018  . Abnormal MRI, lumbar spine (08/29/2018) 10/10/2018  . 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 Lumbar nerve root impingement (Bilateral: L4 & L5) (L>R: L5) 10/10/2018  . Lumbar Spinal instability of L4-5 10/10/2018  . Lumbar facet hypertrophy 10/10/2018  . COPD (chronic obstructive pulmonary disease) with emphysema ([place]) 08/28/2018  . Elevated rheumatoid factor 08/13/2018  . Elevated C-reactive protein (CRP) 08/12/2018  . Elevated sed rate 08/12/2018  . Lumbar facet syndrome (Bilateral) 08/12/2018  . Back pain with left-sided radiculopathy 08/12/2018  . DDD (degenerative disc disease), lumbar 08/12/2018  . Chronic lower extremity pain (Primary Area of Pain) (Bilateral) (L>R) 07/23/2018  . Chronic low back pain (Secondary Area of Pain) (Bilateral) (L>R) w/ sciatica (Bilateral) 07/23/2018  . Chronic pain syndrome 07/23/2018  . Pharmacologic therapy 07/23/2018  . Disorder of skeletal system 07/23/2018  .  Problems influencing health status 07/23/2018  . Chronic sacroiliac joint pain [facility] Area of Pain) (Bilateral) (L>R) 07/23/2018  . Status post lumboperitoneal shunt placement 02/26/2018  . 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 Tympanic membrane perforation 05/30/2016  . PMB (postmenopausal bleeding) 04/26/2016  . Rash 12/27/2015  . 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 Plantar fasciitis of foot (Left) 05/26/2015  . Spondylosis of lumbar region without myelopathy or radiculopathy 05/26/2015  . Lumbar foraminal stenosis (L3-4, L4-5, L5-S1) (L>R) 05/26/2015  . Spinal stenosis of lumbar region with neurogenic claudication 05/26/2015  . Encounter for screening mammogram for malignant neoplasm of breast 12/02/2014  . Goiter 12/02/2014  . Chronic idiopathic gout of foot (Right) 07/06/2014  . Biceps tendonitis (Right) 05/19/2014  . Shoulder pain (Right) 05/19/2014  . Facial weakness 05/05/2014  . Post herpetic neuralgia 05/05/2014  . Chronic dysfunction of left eustachian tube 03/24/2014  . Osteopenia 03/13/2014  . Lumbar Grade 1 Anterolisthesis of L3/4 (15m) & L4/5 (4-180m(w/ Dynamic instability) 03/13/2014  . Family history of colon cancer 12/02/2013  . Dyshidrotic eczema 11/17/2013  . Diastasis recti 04/08/2013  . Facial nerve paresis 07/31/2012  . Blepharospasm 05/08/2012  . Conductive hearing loss of left ear with unrestricted  hearing of right ear 04/08/2012  . Obesity 03/28/2012  . Hemoptysis 12/22/2011  . Neuropathic postherpetic trigeminal neuralgia 12/12/2011  . Allergic rhinitis, mild 10/12/2011  . Disequilibrium syndrome 09/27/2011  . Chronic bronchitis ([place]) 06/05/2011  . Essential hypertension 05/24/2011  . Type 2 diabetes mellitus without complications ([place]) 53/61/4431  . Pulmonary nodules 05/05/2011   Janna Arch, PT, DPT   04/09/2019, 3:17 PM  [place] MAIN [facility] SERVICES [address] [place], [place], [address] Phone: [phone]   Fax:  [phone]  Name: Susan Callahan MRN: [id number] Date of Birth: 02/27/1951

## 2019-04-09 NOTE — Therapy (Signed)
[place] MAIN [facility] SERVICES [address] [place], [place], [address] Phone: [phone]   Fax:  [phone]  Occupational Therapy Treatment  Patient Details  Name: Susan Callahan MRN: [id number] Date of Birth: 1950-10-05 Referring Provider (OT): Humberto Leep, MD   Encounter Date: 04/09/2019  OT End of Session - 04/09/19 1739    Visit Number  11    Number of Visits  36    Date for OT Re-Evaluation  05/20/19    OT Start Time  0330    OT Stop Time  0430    OT Time Calculation (min)  60 min    Activity Tolerance  Patient tolerated treatment well;No increased pain    Behavior During Therapy  WFL for tasks assessed/performed       Past Medical History:  Diagnosis Date  . COPD (chronic obstructive pulmonary disease) ([place])   . Depression 1974  . Diabetes mellitus without complication ([place])   . Hypertension   . Migraine   . 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 LUMBAR PERITONEAL SHUNT    . TYMPANOSTOMY TUBE PLACEMENT      There were no vitals filed for this visit.  Subjective Assessment - 04/09/19 1737    Subjective   Pt presents for OT visit 11/ 36 to address RUE/ RUQ post-mastectomy swelling and axillary web syndrome. Pt  gave permission for PT student to observe and participate in treatment session.    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 01/30/19 partial mastectomy. Margins clear. 0/2 LN  negative for  disease. actinic keritosis,DDD, DM, obesity, thyroid nodules, pulmonary nodules, chronic back pain, HTN, COPD, hx depression    Limitations  pain and swelling in R axilla and arm, chronic back pain, muscle weakness, impaired balance, decreased RUE AROM, abnormal gait, difficulty walking, impaired functional mobility and transfers,    Repetition  Increases Symptoms    Special Tests  -Stemmer at MPs bilaterally    Patient Stated  Goals  relduce pain and improve movement  in my R arm so I can do things I need to do. reduce risk of lymphedema    Pain Onset  More than a month ago    Pain Onset  More than a month ago                   OT Treatments/Exercises (OP) - 04/09/19 0001      ADLs   ADL Education Given  Yes      Manual Therapy   Manual Therapy  Edema management;Manual Lymphatic Drainage (MLD);Myofascial release    Manual therapy comments  seroma site is less dense    today and surrounding swelling decreased significantly. Mild redness remains at scar. Density of seroma is decreased compared with last visit  4 days ago.    Myofascial Release  R volar forearm and arm as established.    Manual Lymphatic Drainage (MLD)  MLD to RUE/ RUQ  utilizing ipsilateral axillo-inguinal watershed, and anterior and posterior axillary anastamoses. Pt tolerated well in supine and sidelying. No myofacial release today.             OT Education - 04/09/19 1739    Education Details  Cont Pt edu for therapeutic exercise and activities for sustained stretch to axillary cording    Person(s) Educated  Patient    Methods  Demonstration;Explanation;Tactile cues;Verbal cues;Handout  Comprehension  Verbalized understanding;Returned demonstration          OT Long Term Goals - 04/07/19 1741      OT LONG TERM GOAL #1   Title  Pt independent with lymphedema precautions and prevention strategies to limit LE progression.    Baseline  Max A    Time  4    Period  Days    Status  Achieved      OT LONG TERM GOAL #2   Title  Pt will be independent with lymphedema self-care home program and AWS ther ex to improve functional performance of basic and instrumental ADLs by DC.    Baseline  Max A    Time  12    Period  Weeks    Status  Achieved      OT LONG TERM GOAL #3   Title  Pt wil report 0/10 pain with RUE functional use ( reaching, lifting, carrying, dressing, dressing, etc) to achieve independent , pain free  ADLs performance.    Baseline  Max A    Time  12    Period  Weeks    Status  New            Plan - 04/09/19 1739    Clinical Impression Statement  Pt tolerated manual therapies as established for cord release without difficulty today. AROM becoming less painful over time as cord re4lease and stretching progresses. Cont as per POC.    OT Occupational Profile and History  Comprehensive Assessment- Review of records and extensive additional review of physical, cognitive, psychosocial history related to current functional performance    Occupational performance deficits (Please refer to evaluation for details):  ADL's;IADL's;Social Participation;Leisure;Rest and Sleep;Work    Marketing executive / Function / Physical Skills  ADL;Decreased knowledge of precautions;ROM;UE functional use;Decreased knowledge of use of DME;Edema;Skin integrity;Pain;IADL    Rehab Potential  Good    Clinical Decision Making  Several treatment options, min-mod task modification necessary    Comorbidities Affecting Occupational Performance:  Presence of comorbidities impacting occupational performance    Comorbidities impacting occupational performance description:  see SUBJUECTIVE    Modification or Assistance to Complete Evaluation   Min-Moderate modification of tasks or assist with assess necessary to complete eval    OT Frequency  2x / week    OT Duration  12 weeks    OT Treatment/Interventions  Self-care/ADL training;Therapeutic exercise;Manual lymph drainage;Patient/family education;Therapeutic activities;DME and/or AE instruction;Manual Therapy;Other (comment)   consider fitting with prophylactic cmopression arm sleeve   Plan  BUE AROM ther ex to reduce axillary cording- 2 sets of 10 2-3 x daily    Consulted and Agree with Plan of Care  Patient       Patient will benefit from skilled therapeutic intervention in order to improve the following deficits and impairments:   Body Structure / Function / Physical  Skills: ADL, Decreased knowledge of precautions, ROM, UE functional use, Decreased knowledge of use of DME, Edema, Skin integrity, Pain, IADL       Visit Diagnosis: 1. Post-mastectomy lymphedema syndrome       Problem List Patient Active Problem List   Diagnosis Date Noted  . Postoperative wound infection 03/13/2019  . Seroma of breast 03/11/2019  . Age-related osteoporosis without current pathological fracture 02/27/2019  . Malignant neoplasm of central portion of right breast in female, estrogen receptor positive ([place]) 02/12/2019  . Genetic testing 01/31/2019  . Malignant neoplasm of upper-inner quadrant of right breast in female, estrogen receptor positive (  [place]) 01/15/2019  . Long term current use of opiate analgesic 12/02/2018  . Tympanic membrane central perforation, left 11/04/2018  . DDD (degenerative disc disease), lumbosacral 10/10/2018  . Abnormal MRI, lumbar spine (08/29/2018) 10/10/2018  . 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 Lumbar nerve root impingement (Bilateral: L4 & L5) (L>R: L5) 10/10/2018  . Lumbar Spinal instability of L4-5 10/10/2018  . Lumbar facet hypertrophy 10/10/2018  . COPD (chronic obstructive pulmonary disease) with emphysema ([place]) 08/28/2018  . Elevated rheumatoid factor 08/13/2018  . Elevated C-reactive protein (CRP) 08/12/2018  . Elevated sed rate 08/12/2018  . Lumbar facet syndrome (Bilateral) 08/12/2018  . Back pain with left-sided radiculopathy 08/12/2018  . DDD (degenerative disc disease), lumbar 08/12/2018  . Chronic lower extremity pain (Primary Area of Pain) (Bilateral) (L>R) 07/23/2018  . Chronic low back pain (Secondary Area of Pain) (Bilateral) (L>R) w/ sciatica (Bilateral) 07/23/2018  . Chronic pain syndrome 07/23/2018  . Pharmacologic therapy 07/23/2018  . Disorder of skeletal system 07/23/2018  . Problems influencing health status 07/23/2018  . Chronic sacroiliac joint pain [facility]  Area of Pain) (Bilateral) (L>R) 07/23/2018  . Status post lumboperitoneal shunt placement 02/26/2018  . 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 Tympanic membrane perforation 05/30/2016  . PMB (postmenopausal bleeding) 04/26/2016  . Rash 12/27/2015  . 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 Plantar fasciitis of foot (Left) 05/26/2015  . Spondylosis of lumbar region without myelopathy or radiculopathy 05/26/2015  . Lumbar foraminal stenosis (L3-4, L4-5, L5-S1) (L>R) 05/26/2015  . Spinal stenosis of lumbar region with neurogenic claudication 05/26/2015  . Encounter for screening mammogram for malignant neoplasm of breast 12/02/2014  . Goiter 12/02/2014  . Chronic idiopathic gout of foot (Right) 07/06/2014  . Biceps tendonitis (Right) 05/19/2014  . Shoulder pain (Right) 05/19/2014  . Facial weakness 05/05/2014  . Post herpetic neuralgia 05/05/2014  . Chronic dysfunction of left eustachian tube 03/24/2014  . Osteopenia 03/13/2014  . Lumbar Grade 1 Anterolisthesis of L3/4 (63mm) & L4/5 (4-76mm)(w/ Dynamic instability) 03/13/2014  . Family history of colon cancer 12/02/2013  . Dyshidrotic eczema 11/17/2013  . Diastasis recti 04/08/2013  . Facial nerve paresis 07/31/2012  . Blepharospasm 05/08/2012  . 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 Obesity 03/28/2012  . Hemoptysis 12/22/2011  . Neuropathic postherpetic trigeminal neuralgia 12/12/2011  . Allergic rhinitis, mild 10/12/2011  . Disequilibrium syndrome 09/27/2011  . Chronic bronchitis ([place]) 06/05/2011  . Essential hypertension 05/24/2011  . Type 2 diabetes mellitus without complications ([place]) 40/06/2724  . Pulmonary nodules 05/05/2011    Andrey Spearman, MS, OTR/L, [facility] 04/09/19 5:42 PM  [place] MAIN [place] [facility] SERVICES [address] [place], [place], [address] Phone:  [phone]   Fax:  [phone]  Name: Yanel Dombrosky MRN: [id number] Date of Birth: 1950/10/03

## 2019-04-10 ENCOUNTER — Ambulatory Visit: Payer: Medicare Other | Admitting: Occupational Therapy

## 2019-04-10 ENCOUNTER — Other Ambulatory Visit: Payer: Self-pay

## 2019-04-10 DIAGNOSIS — M545 Low back pain: Secondary | ICD-10-CM | POA: Diagnosis not present

## 2019-04-10 DIAGNOSIS — I972 Postmastectomy lymphedema syndrome: Secondary | ICD-10-CM

## 2019-04-10 DIAGNOSIS — I890 Lymphedema, not elsewhere classified: Secondary | ICD-10-CM | POA: Insufficient documentation

## 2019-04-10 NOTE — Therapy (Signed)
[place] MAIN [facility] SERVICES [address] [place], [place], [address] Phone: [phone]   Fax:  [phone]  Occupational Therapy Treatment  Patient Details  Name: Susan Callahan MRN: [id number] Date of Birth: [birth year] Referring Provider (OT): Humberto Leep, MD   Encounter Date: 04/10/2019  OT End of Session - 04/10/19 1706    Visit Number  12    Number of Visits  36    Date for OT Re-Evaluation  05/20/19    OT Start Time  0320    OT Stop Time  0420    OT Time Calculation (min)  60 min    Activity Tolerance  Patient tolerated treatment well;No increased pain    Behavior During Therapy  WFL for tasks assessed/performed       Past Medical History:  Diagnosis Date  . COPD (chronic obstructive pulmonary disease) ([place])   . Depression 1974  . Diabetes mellitus without complication ([place])   . Hypertension   . Migraine   . 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 LUMBAR PERITONEAL SHUNT    . TYMPANOSTOMY TUBE PLACEMENT      There were no vitals filed for this visit.  Subjective Assessment - 04/10/19 1527    Subjective   Pt presents for OT visit 12/ 36 to address RUE/ RUQ post-mastectomy swelling and axillary web syndrome.Pt reports that functional taks, including raising and lowering kitchen shade, reaching overhead into shelves, dressing, bathing are much easier and pain free.    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 01/30/19 partial mastectomy. Margins clear. 0/2 LN  negative for  disease. actinic keritosis,DDD, DM, obesity, thyroid nodules, pulmonary nodules, chronic back pain, HTN, COPD, hx depression    Limitations  pain and swelling in R axilla and arm, chronic back pain, muscle weakness, impaired balance, decreased RUE AROM, abnormal gait, difficulty walking, impaired functional mobility and transfers,    Repetition  Increases  Symptoms    Special Tests  -Stemmer at MPs bilaterally    Patient Stated Goals  relduce pain and improve movement  in my R arm so I can do things I need to do. reduce risk of lymphedema    Pain Onset  More than a month ago    Pain Onset  More than a month ago                   OT Treatments/Exercises (OP) - 04/10/19 0001      ADLs   ADL Education Given  Yes      Manual Therapy   Manual Therapy  Edema management;Manual Lymphatic Drainage (MLD);Myofascial release    Myofascial Release  R volar forearm and arm as established.    Manual Lymphatic Drainage (MLD)  MLD to RUE/ RUQ  utilizing ipsilateral axillo-inguinal watershed, and anterior and posterior axillary anastamoses. Pt tolerated well in supine and sidelying. No myofacial release today.             OT Education - 04/10/19 1706    Education Details  Cont Pt edu for therapeutic exercise and activities for sustained stretch to axillary cording    Person(s) Educated  Patient    Methods  Demonstration;Explanation;Tactile cues;Verbal cues;Handout    Comprehension  Verbalized understanding;Returned demonstration          OT Long Term Goals - 04/07/19 1741      OT LONG TERM  GOAL #1   Title  Pt independent with lymphedema precautions and prevention strategies to limit LE progression.    Baseline  Max A    Time  4    Period  Days    Status  Achieved      OT LONG TERM GOAL #2   Title  Pt will be independent with lymphedema self-care home program and AWS ther ex to improve functional performance of basic and instrumental ADLs by DC.    Baseline  Max A    Time  12    Period  Weeks    Status  Achieved      OT LONG TERM GOAL #3   Title  Pt wil report 0/10 pain with RUE functional use ( reaching, lifting, carrying, dressing, dressing, etc) to achieve independent , pain free ADLs performance.    Baseline  Max A    Time  12    Period  Weeks    Status  New            Plan - 04/10/19 1707    Clinical  Impression Statement  Released small, deep entrapped lymphatic cord in R vlar FA. No additional cord release palpable today. Pt demonstrates full R shoulder AROM in all planes  w/ reduced pain at distal FA today compared w last visit (not rated numerically). Cont as per POC. Pt continues to demonstrate steady progress towards all OT goals.    OT Occupational Profile and History  Comprehensive Assessment- Review of records and extensive additional review of physical, cognitive, psychosocial history related to current functional performance    Occupational performance deficits (Please refer to evaluation for details):  ADL's;IADL's;Social Participation;Leisure;Rest and Sleep;Work    Marketing executive / Function / Physical Skills  ADL;Decreased knowledge of precautions;ROM;UE functional use;Decreased knowledge of use of DME;Edema;Skin integrity;Pain;IADL    Rehab Potential  Good    Clinical Decision Making  Several treatment options, min-mod task modification necessary    Comorbidities Affecting Occupational Performance:  Presence of comorbidities impacting occupational performance    Comorbidities impacting occupational performance description:  see SUBJUECTIVE    Modification or Assistance to Complete Evaluation   Min-Moderate modification of tasks or assist with assess necessary to complete eval    OT Frequency  2x / week    OT Duration  12 weeks    OT Treatment/Interventions  Self-care/ADL training;Therapeutic exercise;Manual lymph drainage;Patient/family education;Therapeutic activities;DME and/or AE instruction;Manual Therapy;Other (comment)   consider fitting with prophylactic cmopression arm sleeve   Plan  BUE AROM ther ex to reduce axillary cording- 2 sets of 10 2-3 x daily    Consulted and Agree with Plan of Care  Patient       Patient will benefit from skilled therapeutic intervention in order to improve the following deficits and impairments:   Body Structure / Function / Physical Skills:  ADL, Decreased knowledge of precautions, ROM, UE functional use, Decreased knowledge of use of DME, Edema, Skin integrity, Pain, IADL       Visit Diagnosis: 1. Post-mastectomy lymphedema syndrome       Problem List Patient Active Problem List   Diagnosis Date Noted  . Postoperative wound infection 03/13/2019  . Seroma of breast 03/11/2019  . Age-related osteoporosis without current pathological fracture 02/27/2019  . Malignant neoplasm of central portion of right breast in female, estrogen receptor positive ([place]) 02/12/2019  . Genetic testing 01/31/2019  . Malignant neoplasm of upper-inner quadrant of right breast in female, estrogen receptor positive ([place]) 01/15/2019  .  Long term current use of opiate analgesic 12/02/2018  . Tympanic membrane central perforation, left 11/04/2018  . DDD (degenerative disc disease), lumbosacral 10/10/2018  . Abnormal MRI, lumbar spine (08/29/2018) 10/10/2018  . 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 Lumbar nerve root impingement (Bilateral: L4 & L5) (L>R: L5) 10/10/2018  . Lumbar Spinal instability of L4-5 10/10/2018  . Lumbar facet hypertrophy 10/10/2018  . COPD (chronic obstructive pulmonary disease) with emphysema ([place]) 08/28/2018  . Elevated rheumatoid factor 08/13/2018  . Elevated C-reactive protein (CRP) 08/12/2018  . Elevated sed rate 08/12/2018  . Lumbar facet syndrome (Bilateral) 08/12/2018  . Back pain with left-sided radiculopathy 08/12/2018  . DDD (degenerative disc disease), lumbar 08/12/2018  . Chronic lower extremity pain (Primary Area of Pain) (Bilateral) (L>R) 07/23/2018  . Chronic low back pain (Secondary Area of Pain) (Bilateral) (L>R) w/ sciatica (Bilateral) 07/23/2018  . Chronic pain syndrome 07/23/2018  . Pharmacologic therapy 07/23/2018  . Disorder of skeletal system 07/23/2018  . Problems influencing health status 07/23/2018  . Chronic sacroiliac joint pain [facility] Area of  Pain) (Bilateral) (L>R) 07/23/2018  . Status post lumboperitoneal shunt placement 02/26/2018  . 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 Tympanic membrane perforation 05/30/2016  . PMB (postmenopausal bleeding) 04/26/2016  . Rash 12/27/2015  . 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 Plantar fasciitis of foot (Left) 05/26/2015  . Spondylosis of lumbar region without myelopathy or radiculopathy 05/26/2015  . Lumbar foraminal stenosis (L3-4, L4-5, L5-S1) (L>R) 05/26/2015  . Spinal stenosis of lumbar region with neurogenic claudication 05/26/2015  . Encounter for screening mammogram for malignant neoplasm of breast 12/02/2014  . Goiter 12/02/2014  . Chronic idiopathic gout of foot (Right) 07/06/2014  . Biceps tendonitis (Right) 05/19/2014  . Shoulder pain (Right) 05/19/2014  . Facial weakness 05/05/2014  . Post herpetic neuralgia 05/05/2014  . Chronic dysfunction of left eustachian tube 03/24/2014  . Osteopenia 03/13/2014  . Lumbar Grade 1 Anterolisthesis of L3/4 (78mm) & L4/5 (4-63mm)(w/ Dynamic instability) 03/13/2014  . Family history of colon cancer 12/02/2013  . Dyshidrotic eczema 11/17/2013  . Diastasis recti 04/08/2013  . Facial nerve paresis 07/31/2012  . Blepharospasm 05/08/2012  . 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 Obesity 03/28/2012  . Hemoptysis 12/22/2011  . Neuropathic postherpetic trigeminal neuralgia 12/12/2011  . Allergic rhinitis, mild 10/12/2011  . Disequilibrium syndrome 09/27/2011  . Chronic bronchitis ([place]) 06/05/2011  . Essential hypertension 05/24/2011  . Type 2 diabetes mellitus without complications ([place]) 07/05/8526  . Pulmonary nodules 05/05/2011    Andrey Spearman, MS, OTR/L, [facility] 04/10/19 5:10 PM  [place] MAIN [facility] SERVICES [address] [place], [place], [address] Phone: [phone]    Fax:  [phone]  Name: Aubriauna Riner MRN: [id number] Date of Birth: September 13, 1950

## 2019-04-14 ENCOUNTER — Ambulatory Visit: Payer: Medicare Other | Admitting: Occupational Therapy

## 2019-04-14 ENCOUNTER — Ambulatory Visit: Payer: Medicare Other | Attending: Rehabilitation

## 2019-04-14 ENCOUNTER — Other Ambulatory Visit: Payer: Self-pay

## 2019-04-14 DIAGNOSIS — R293 Abnormal posture: Secondary | ICD-10-CM | POA: Diagnosis present

## 2019-04-14 DIAGNOSIS — G8929 Other chronic pain: Secondary | ICD-10-CM

## 2019-04-14 DIAGNOSIS — M545 Low back pain, unspecified: Secondary | ICD-10-CM

## 2019-04-14 DIAGNOSIS — I972 Postmastectomy lymphedema syndrome: Secondary | ICD-10-CM

## 2019-04-14 DIAGNOSIS — M6281 Muscle weakness (generalized): Secondary | ICD-10-CM

## 2019-04-14 DIAGNOSIS — R2689 Other abnormalities of gait and mobility: Secondary | ICD-10-CM

## 2019-04-14 NOTE — Therapy (Signed)
[place] MAIN [facility] SERVICES [address] [place], [place], [address] Phone: [phone]   Fax:  [phone]  Physical Therapy Treatment/ [facility]  Patient Details  Name: Margarete Horace MRN: [id number] Date of Birth: 1951-02-02 Referring Provider (PT): Francesco Sor   Encounter Date: 04/14/2019  PT End of Session - 04/14/19 1445    Visit Number  45    Number of Visits  74    Date for PT Re-Evaluation  05/12/19    Authorization Type  5/10 PN on 03/24/19    PT Start Time  1430    PT Stop Time  1515    PT Time Calculation (min)  45 min    Equipment Utilized During Treatment  Gait belt    Activity Tolerance  Patient tolerated treatment well    Behavior During Therapy  [facility] for tasks assessed/performed       Past Medical History:  Diagnosis Date  . COPD (chronic obstructive pulmonary disease) ([place])   . Depression 1974  . Diabetes mellitus without complication ([place])   . Hypertension   . Migraine   . 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 LUMBAR PERITONEAL SHUNT    . TYMPANOSTOMY TUBE PLACEMENT      There were no vitals filed for this visit.  Subjective Assessment - 04/14/19 1443    Subjective  Patient presents with excessive swelling of RUE and RLE that is impacting her stability and mobility. Has been compliant with HEP and noticing and improvement with her back.    Pertinent History  Pain began in 2007 and has gradually got worse and has progressed to nonstop. Patient states pain last all day long, everyday with no change in symptoms. Medication helps briefly. States she has difficulty with showering and standing. States she has to stand with her knee bent due to pulling sensation in legs. Pain begins immediately when stands and begins to walk. Pain feels better when she bends her knees when standing. Pain became constant and non stop around Dec 2018. Denies bowel or bladder  changes, unexplained weakness, or unexpected weight loss/gain. She currently goes to pulmonary rehab 3x a week, but has increased pain with treadmill walking. Will walk with cane on occasion around house especially at night due to occasional dizziness and vision impairments in medical history. States she also has difficulty with carrying groceries up steps due to low back pain. She would like to be able to walk better and have less pain to be less reliant on medication.     Limitations  Lifting;Standing;Walking;House hold activities    How long can you stand comfortably?  pain begins immediately     How long can you walk comfortably?  pain begins immediately     Patient Stated Goals  walk better, have less pain    Currently in Pain?  Yes    Pain Score  2     Pain Location  Back    Pain Orientation  Lower    Pain Descriptors / Indicators  Aching;Burning    Pain Type  Chronic pain    Pain Radiating Towards  left leg to midcalf    Pain Onset  More than a month ago    Pain Frequency  Intermittent          R UE/LE swelling     Goals VAS: 5/10 due to "stooping over"  BLE strength: deferred due to swelling/pain to  palpation  New goal: patient will tolerate 10 minutes of upright position/postural alignment with mobility.   Treatment   Manual Therapy Roller stick for muscle tissue lengthening/soft tissue release to lumbar paraspinals and piriformis/gluteal musculature x 3 minutes. Roller stick to hamstring and calf for muscle tissue lengthening/soft tissue release  x5 minutes SADin figure 4 positionwith belt BLE6x 20 seconds inferior direction   Ther-ex: cueing for sequencing, positioning and muscle activation   Posterior pelvic tilts x15.tactileand verbalcue for appropriate technique5 seconds with hold, improved alignment TrA activation hooklying 10x 5 second holds Seated ankle pumps for swelling reduction x 20    Swelling of LE's measured: Swelling of LE's  [place]; R  39.1 L 36.2     Pt educated throughout session about proper posture and technique with exercises. Improved exercise technique, movement at target joints, use of target muscles after min to mod verbal, visual, tactile cues.      PT Education - 04/14/19 1445    Education Details  exercise technique, manual, goals, POC    Person(s) Educated  Patient    Methods  Explanation;Demonstration;Tactile cues;Verbal cues    Comprehension  Verbalized understanding;Returned demonstration;Verbal cues required;Tactile cues required       PT Short Term Goals - 04/14/19 1615      PT SHORT TERM GOAL #1   Title  Patient will be independent with completion of HEP to improve ability to complete functional tasks and functional ADLs.    Baseline  10/23: will give HEP next session 11/19: HEP compliance    Time  2    Period  Weeks    Status  Achieved      PT SHORT TERM GOAL #2   Title  Patient will report pain score less than 4/10 on VAS to demonstrate reduction of pain levels with functional tasks.     Baseline  10/23: 6/10 11/19: 5/10 12/12: 9/10 1/10: 7/10  2/11: 3/10     Time  2    Period  Weeks    Status  Achieved        PT Long Term Goals - 04/14/19 1616      PT LONG TERM GOAL #1   Title  Patient will report pain score of <3/10 on VAS for decreased pain levels and ease with functional activities.    Baseline  10/23: 6/10 11/19: 5/10 12/12 9/10 1/10: 7/10 2/11: 3/10 3/9: 6/10  5/26: 8/10 03/17/19: 7/10 8/3: 5/10    Time  4    Period  Weeks    Status  Partially Met    Target Date  05/12/19      PT LONG TERM GOAL #2   Title  Patient will be able to ambulate on treadmill for 53mnutes without increase in back/leg pain demonstrating improved community ambulation and treadmill walking at pulmonary rehab    Baseline  10/23: 024mutes increases pain 11/19: 45m11mtes 12/12: has not had the chance to perform 1/10: was able to do 5 minutes without pain 2/11: able to do 5 minutes then had to bend over  3/9: 5 minutes 30 seconds at 2.0 mphs 5/26: had to walk 10 minutes at cancer center with extreme pain but was able to perform 7/6: walks 10 minutes with increase of pain by 2 points 8/3: able to perform with bent over posture    Time  4    Period  Weeks    Status  Achieved      PT LONG TERM GOAL #3   Title  Patient will have decreased modified oswestry score <50% for improved ability to complete household tasks with less pain levels.     Baseline  10/23: 62% 11/19: 42%      Time  4    Period  Weeks    Status  Achieved      PT LONG TERM GOAL #4   Title  Patient will score 4+/5 on BLE MMT to demonstrate improved glute strength and ability to carry groceries with increased ease up/down stairs at home.    Baseline  10/23: 4-/5 grossly 11/19: 4/5 grossly; hamstrings 4-/5 12/12: 4/5 gross 1/10: 4/5 gross 2/11: gross 4+/5 with hamstrings and abuductors 4-/5 3/9: gross 4+/5 hamstrings and abductors 4-/5 5/27: grossly 3+/5 with pain with LLE movements 7/6: grossly 4-/5 with pain with LLE movement 8/3: deferred due to swelling/pain    Time  4    Period  Weeks    Status  Deferred    Target Date  05/12/19      PT LONG TERM GOAL #5   Title  Patient will have decreased modified oswestry score <35% for improved ability to complete household tasks with less pain levels.     Baseline  11/19: 42% 12/12: 52% 1/10: 48%  2/11: 30%     Time  4    Period  Weeks    Status  Achieved      Additional Long Term Goals   Additional Long Term Goals  Yes      PT LONG TERM GOAL #6   Title  Patient will be able to make and/or strip the bed without pain and without needing rest breaks to perform iADLs independently.     Baseline  2/11: pain limits ability to perform, ~4 breaks required 3/9: takes her time, 3 breaks 5/27: very painful still 7/6: requires only one rest break 8/3: able to perform with +2 pain increase    Time  4    Period  Weeks    Status  Partially Met    Target Date  05/12/19      PT LONG TERM  GOAL #7   Title  Patient will have decreased modified oswestry score <20% for improved ability to complete household tasks with less pain levels.     Baseline  2/11: 30%  3/9: 30% 5/27: 63% 7/6: 38% 8/3: 42%    Time  4    Period  Weeks    Status  On-going    Target Date  05/12/19      PT LONG TERM GOAL #8   Title  Patient will tolerate >10 minutes of upright postural positioning of trunk with active mobility with <2 degrees VAS increase.    Baseline  8/3: unable to tolerate 10  minutes    Time  4    Period  Weeks    Status  New    Target Date  05/12/19            Plan - 04/14/19 1623    Clinical Impression Statement  Patient is progressing with functional goals. Low back and radiating pain has reduced by at least 2 points. Patient is not as limited in ambulation duration at this time however is limited in position. Patient's MMT not performed due to LE swelling resulting in tenderness with pressure applied. standing interventions. Patient continues to have reduced symptoms s/p session with improved gait mechanics and decreased radiating symptoms. Patient will continue to benefit from skilled physical therapy to improve strength, to reduce pain  levels with ADLs, and to improve quality of life.    Rehab Potential  Fair    Clinical Impairments Affecting Rehab Potential  (+) motivation (-) chronicity of symptoms    PT Frequency  2x / week    PT Duration  4 weeks    PT Treatment/Interventions  ADLs/Self Care Home Management;Cryotherapy;Moist Heat;Traction;Iontophoresis 87m/ml Dexamethasone;Ultrasound;Gait training;Stair training;Functional mobility training;Neuromuscular re-education;Balance training;Therapeutic exercise;Therapeutic activities;Patient/family education;Manual techniques;Energy conservation;Aquatic Therapy;Electrical Stimulation;Passive range of motion;Dry needling;Taping    PT Next Visit Plan  progress strengthening and postural mechanics    PT Home Exercise Plan  No updates  this day    Consulted and Agree with Plan of Care  Patient       Patient will benefit from skilled therapeutic intervention in order to improve the following deficits and impairments:  Abnormal gait, Cardiopulmonary status limiting activity, Decreased activity tolerance, Decreased balance, Decreased endurance, Decreased range of motion, Difficulty walking, Decreased strength, Hypomobility, Decreased mobility, Impaired perceived functional ability, Impaired flexibility, Postural dysfunction, Obesity, Improper body mechanics, Pain  Visit Diagnosis: 1. Chronic low back pain, unspecified back pain laterality, unspecified whether sciatica present   2. Abnormal posture   3. Muscle weakness (generalized)   4. Other abnormalities of gait and mobility        Problem List Patient Active Problem List   Diagnosis Date Noted  . Postoperative wound infection 03/13/2019  . Seroma of breast 03/11/2019  . Age-related osteoporosis without current pathological fracture 02/27/2019  . Malignant neoplasm of central portion of right breast in female, estrogen receptor positive ([place] 02/12/2019  . Genetic testing 01/31/2019  . Malignant neoplasm of upper-inner quadrant of right breast in female, estrogen receptor positive ([place] 01/15/2019  . Long term current use of opiate analgesic 12/02/2018  . Tympanic membrane central perforation, left 11/04/2018  . DDD (degenerative disc disease), lumbosacral 10/10/2018  . Abnormal MRI, lumbar spine (08/29/2018) 10/10/2018  . 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 Lumbar nerve root impingement (Bilateral: L4 & L5) (L>R: L5) 10/10/2018  . Lumbar Spinal instability of L4-5 10/10/2018  . Lumbar facet hypertrophy 10/10/2018  . COPD (chronic obstructive pulmonary disease) with emphysema ([place] 08/28/2018  . Elevated rheumatoid factor 08/13/2018  . Elevated C-reactive protein (CRP) 08/12/2018  . Elevated sed rate 08/12/2018   . Lumbar facet syndrome (Bilateral) 08/12/2018  . Back pain with left-sided radiculopathy 08/12/2018  . DDD (degenerative disc disease), lumbar 08/12/2018  . Chronic lower extremity pain (Primary Area of Pain) (Bilateral) (L>R) 07/23/2018  . Chronic low back pain (Secondary Area of Pain) (Bilateral) (L>R) w/ sciatica (Bilateral) 07/23/2018  . Chronic pain syndrome 07/23/2018  . Pharmacologic therapy 07/23/2018  . Disorder of skeletal system 07/23/2018  . Problems influencing health status 07/23/2018  . Chronic [facility]ac [facility] ([facility] (Bilateral) (L>R) 07/23/2018  . Status post lumboperitoneal shunt placement 02/26/2018  . 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 Tympanic membrane perforation 05/30/2016  . PMB (postmenopausal bleeding) 04/26/2016  . Rash 12/27/2015  . 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 Plantar fasciitis of foot (Left) 05/26/2015  . Spondylosis of lumbar region without myelopathy or radiculopathy 05/26/2015  . Lumbar foraminal stenosis (L3-4, L4-5, L5-S1) (L>R) 05/26/2015  . Spinal stenosis of lumbar region with neurogenic claudication 05/26/2015  . Encounter for screening mammogram for malignant neoplasm of breast 12/02/2014  . Goiter 12/02/2014  . Chronic idiopathic gout of foot (Right) 07/06/2014  . Biceps tendonitis (  Right) 05/19/2014  . Shoulder pain (Right) 05/19/2014  . Facial weakness 05/05/2014  . Post herpetic neuralgia 05/05/2014  . Chronic dysfunction of left eustachian tube 03/24/2014  . Osteopenia 03/13/2014  . Lumbar Grade 1 Anterolisthesis of L3/4 (68m) & L4/5 (4-11m(w/ Dynamic instability) 03/13/2014  . Family history of colon cancer 12/02/2013  . Dyshidrotic eczema 11/17/2013  . Diastasis recti 04/08/2013  . Facial nerve paresis 07/31/2012  . Blepharospasm 05/08/2012  . Conductive hearing loss of left ear with unrestricted hearing of right ear  04/08/2012  . Obesity 03/28/2012  . Hemoptysis 12/22/2011  . Neuropathic postherpetic trigeminal neuralgia 12/12/2011  . Allergic rhinitis, mild 10/12/2011  . Disequilibrium syndrome 09/27/2011  . Chronic bronchitis ([place]09/24/2012  . Essential hypertension 05/24/2011  . Type 2 diabetes mellitus without complications ([place]0910/93/2355. Pulmonary nodules 05/05/2011   MaJanna ArchPT, DPT   04/14/2019, 4:26 PM  [facility] RE[facility]ERV[facility] [address]hone: [phone] Fax:  [phone]: LiSaran LavioletteRN: [id number] of Birth: 6/19-May-1951

## 2019-04-14 NOTE — Therapy (Signed)
[place] MAIN [facility] SERVICES [address] [place], [place], [address] Phone: [phone]   Fax:  [phone]  Occupational Therapy Treatment  Patient Details  Name: Susan Callahan MRN: [id number] Date of Birth: 09-Feb-1951 Referring Provider (OT): Humberto Leep, MD   Encounter Date: 04/14/2019  OT End of Session - 04/14/19 1656    Visit Number  13    Number of Visits  36    Date for OT Re-Evaluation  05/20/19    OT Start Time  0315    OT Stop Time  0430    OT Time Calculation (min)  75 min    Activity Tolerance  Patient tolerated treatment well;No increased pain    Behavior During Therapy  WFL for tasks assessed/performed       Past Medical History:  Diagnosis Date  . COPD (chronic obstructive pulmonary disease) ([place])   . Depression 1974  . Diabetes mellitus without complication ([place])   . Hypertension   . Migraine   . 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 LUMBAR PERITONEAL SHUNT    . TYMPANOSTOMY TUBE PLACEMENT      There were no vitals filed for this visit.  Subjective Assessment - 04/14/19 1646    Subjective   Pt presents for OT visit 13/ 36 to address RUE/ RUQ post-mastectomy swelling at surgical site at seroma and axillary web syndrome.Pt has new complaints today of onset of hand swelling last Thursday evening after last OT session, and progressive swelling in BLE, both without known precipitating events. Discussed w/ Pt and treating PT who also participated in visit, that progressive BLE swelling is most likely systemic, medication side effect, and /or venous related.    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 01/30/19 partial mastectomy. Margins clear. 0/2 LN  negative for  disease. actinic keritosis,DDD, DM, obesity, thyroid nodules, pulmonary nodules, chronic back pain, HTN, COPD, hx depression    Limitations  pain and  swelling in R axilla and arm, chronic back pain, muscle weakness, impaired balance, decreased RUE AROM, abnormal gait, difficulty walking, impaired functional mobility and transfers,    Repetition  Increases Symptoms    Special Tests  -Stemmer at MPs bilaterally    Patient Stated Goals  relduce pain and improve movement  in my R arm so I can do things I need to do. reduce risk of lymphedema    Pain Onset  More than a month ago    Pain Onset  More than a month ago          LYMPHEDEMA/ONCOLOGY QUESTIONNAIRE - 04/14/19 1652      Right Upper Extremity Lymphedema   Other  RUE limb volume =  2758.52 ml    Other  Limb volume is reduced overall in affected RUE , on average, by 3.09%.. The volumetric calculation, however, does not capture the distribution or density of the swelling , mainly in the hand and distal FA.              OT Treatments/Exercises (OP) - 04/14/19 0001      ADLs   ADL Education Given  Yes      Manual Therapy   Manual Therapy  Edema management;Manual Lymphatic Drainage (MLD);Myofascial release    Manual therapy comments  RUE comparative limb volumetrics    Myofascial Release  R volar forearm and arm as established.  Manual Lymphatic Drainage (MLD)  MLD to RUE/ RUQ  utilizing ipsilateral axillo-inguinal watershed, and anterior and posterior axillary anastamoses. Pt tolerated well in supine and sidelying. No myofacial release today.             OT Education - 04/14/19 1655    Education Details  differential diagnosis, lymphatic vs venous vs venous. Revied lymphatic anatomy realted to Pt's surgery  and seroma    Person(s) Educated  Patient    Methods  Demonstration;Explanation;Tactile cues;Verbal cues;Handout    Comprehension  Verbalized understanding;Returned demonstration          OT Long Term Goals - 04/07/19 1741      OT LONG TERM GOAL #1   Title  Pt independent with lymphedema precautions and prevention strategies to limit LE progression.     Baseline  Max A    Time  4    Period  Days    Status  Achieved      OT LONG TERM GOAL #2   Title  Pt will be independent with lymphedema self-care home program and AWS ther ex to improve functional performance of basic and instrumental ADLs by DC.    Baseline  Max A    Time  12    Period  Weeks    Status  Achieved      OT LONG TERM GOAL #3   Title  Pt wil report 0/10 pain with RUE functional use ( reaching, lifting, carrying, dressing, dressing, etc) to achieve independent , pain free ADLs performance.    Baseline  Max A    Time  12    Period  Weeks    Status  New            Plan - 04/14/19 1656    Clinical Impression Statement  Pt arrives with mild swelling in BLE today, and also complains of feeling "swollen all over." Leg swelling , (R>L is also atypical by report today) appears suspicious for venous etiology combined with possible systemic fluid retention. Pt educated that letrazole may also have less common side effect incuding distal leg and feet swelling. I encouraged Pt to contact both her PCP and medical oncologist to discuss these symptoms. If LE swelling bwecomes chronic I am happy to treat for accompanying lymphatic  disfunction and fit with compression stockings. RUE swelling is new today. Swelling is confined to R hand and distal FA. Pt denies known injury or insect bite, but confirms she was in the garden over the weekend. Tissue desnity is also increased in same anatomy where fluid congestion is measured. Curiously, RUE comparative limb volumetrics reveal that , overall, RUE is decreased in limb volume by 3.09%. Althogh measurements show hand and distal arm sre increased on average by ~ 2 cm at several landmarks, proximal forearm and arm are decreased in volume compared with initial volumetricswhen no measurable LE was observed. Pt agrees with plan tocarefully monitor RUE swelling for any signs/ symptoms of infection. hen she returns for OT in 2 days we will continue MLD  and commence compression wrapping to RUE PRN. Pt tolerated MLD and brief MR today.    OT Occupational Profile and History  Comprehensive Assessment- Review of records and extensive additional review of physical, cognitive, psychosocial history related to current functional performance    Occupational performance deficits (Please refer to evaluation for details):  ADL's;IADL's;Social Participation;Leisure;Rest and Sleep;Work    Marketing executive / Function / Physical Skills  ADL;Decreased knowledge of precautions;ROM;UE functional use;Decreased knowledge of  use of DME;Edema;Skin integrity;Pain;IADL    Rehab Potential  Good    Clinical Decision Making  Several treatment options, min-mod task modification necessary    Comorbidities Affecting Occupational Performance:  Presence of comorbidities impacting occupational performance    Comorbidities impacting occupational performance description:  see SUBJUECTIVE    Modification or Assistance to Complete Evaluation   Min-Moderate modification of tasks or assist with assess necessary to complete eval    OT Frequency  2x / week    OT Duration  12 weeks    OT Treatment/Interventions  Self-care/ADL training;Therapeutic exercise;Manual lymph drainage;Patient/family education;Therapeutic activities;DME and/or AE instruction;Manual Therapy;Other (comment)   consider fitting with prophylactic cmopression arm sleeve   Plan  BUE AROM ther ex to reduce axillary cording- 2 sets of 10 2-3 x daily    Consulted and Agree with Plan of Care  Patient       Patient will benefit from skilled therapeutic intervention in order to improve the following deficits and impairments:   Body Structure / Function / Physical Skills: ADL, Decreased knowledge of precautions, ROM, UE functional use, Decreased knowledge of use of DME, Edema, Skin integrity, Pain, IADL       Visit Diagnosis: 1. Postmastectomy lymphedema syndrome       Problem List Patient Active Problem List    Diagnosis Date Noted  . Postoperative wound infection 03/13/2019  . Seroma of breast 03/11/2019  . Age-related osteoporosis without current pathological fracture 02/27/2019  . Malignant neoplasm of central portion of right breast in female, estrogen receptor positive ([place]) 02/12/2019  . Genetic testing 01/31/2019  . Malignant neoplasm of upper-inner quadrant of right breast in female, estrogen receptor positive ([place]) 01/15/2019  . Long term current use of opiate analgesic 12/02/2018  . Tympanic membrane central perforation, left 11/04/2018  . DDD (degenerative disc disease), lumbosacral 10/10/2018  . Abnormal MRI, lumbar spine (08/29/2018) 10/10/2018  . 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 Lumbar nerve root impingement (Bilateral: L4 & L5) (L>R: L5) 10/10/2018  . Lumbar Spinal instability of L4-5 10/10/2018  . Lumbar facet hypertrophy 10/10/2018  . COPD (chronic obstructive pulmonary disease) with emphysema ([place]) 08/28/2018  . Elevated rheumatoid factor 08/13/2018  . Elevated C-reactive protein (CRP) 08/12/2018  . Elevated sed rate 08/12/2018  . Lumbar facet syndrome (Bilateral) 08/12/2018  . Back pain with left-sided radiculopathy 08/12/2018  . DDD (degenerative disc disease), lumbar 08/12/2018  . Chronic lower extremity pain (Primary Area of Pain) (Bilateral) (L>R) 07/23/2018  . Chronic low back pain (Secondary Area of Pain) (Bilateral) (L>R) w/ sciatica (Bilateral) 07/23/2018  . Chronic pain syndrome 07/23/2018  . Pharmacologic therapy 07/23/2018  . Disorder of skeletal system 07/23/2018  . Problems influencing health status 07/23/2018  . Chronic sacroiliac joint pain [facility] Area of Pain) (Bilateral) (L>R) 07/23/2018  . Status post lumboperitoneal shunt placement 02/26/2018  . 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 Tympanic membrane perforation 05/30/2016  . PMB (postmenopausal bleeding)  04/26/2016  . Rash 12/27/2015  . 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 Plantar fasciitis of foot (Left) 05/26/2015  . Spondylosis of lumbar region without myelopathy or radiculopathy 05/26/2015  . Lumbar foraminal stenosis (L3-4, L4-5, L5-S1) (L>R) 05/26/2015  . Spinal stenosis of lumbar region with neurogenic claudication 05/26/2015  . Encounter for screening mammogram for malignant neoplasm of breast 12/02/2014  . Goiter 12/02/2014  . Chronic idiopathic gout of foot (Right) 07/06/2014  . Biceps tendonitis (Right)  05/19/2014  . Shoulder pain (Right) 05/19/2014  . Facial weakness 05/05/2014  . Post herpetic neuralgia 05/05/2014  . Chronic dysfunction of left eustachian tube 03/24/2014  . Osteopenia 03/13/2014  . Lumbar Grade 1 Anterolisthesis of L3/4 (72mm) & L4/5 (4-25mm)(w/ Dynamic instability) 03/13/2014  . Family history of colon cancer 12/02/2013  . Dyshidrotic eczema 11/17/2013  . Diastasis recti 04/08/2013  . Facial nerve paresis 07/31/2012  . Blepharospasm 05/08/2012  . 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 Obesity 03/28/2012  . Hemoptysis 12/22/2011  . Neuropathic postherpetic trigeminal neuralgia 12/12/2011  . Allergic rhinitis, mild 10/12/2011  . Disequilibrium syndrome 09/27/2011  . Chronic bronchitis ([place]) 06/05/2011  . Essential hypertension 05/24/2011  . Type 2 diabetes mellitus without complications ([place]) 51/03/1251  . Pulmonary nodules 05/05/2011    Andrey Spearman, MS, OTR/L, [facility] 04/14/19 5:09 PM  [place] MAIN [facility] SERVICES [address] [place], [place], [address] Phone: [phone]   Fax:  [phone]  Name: Raia Amico MRN: [id number] Date of Birth: [birth year]

## 2019-04-16 ENCOUNTER — Other Ambulatory Visit: Payer: Self-pay

## 2019-04-16 ENCOUNTER — Ambulatory Visit: Payer: Medicare Other | Admitting: Occupational Therapy

## 2019-04-16 ENCOUNTER — Ambulatory Visit: Payer: Medicare Other

## 2019-04-16 DIAGNOSIS — I972 Postmastectomy lymphedema syndrome: Secondary | ICD-10-CM

## 2019-04-16 DIAGNOSIS — G8929 Other chronic pain: Secondary | ICD-10-CM

## 2019-04-16 DIAGNOSIS — R293 Abnormal posture: Secondary | ICD-10-CM

## 2019-04-16 DIAGNOSIS — R609 Edema, unspecified: Secondary | ICD-10-CM | POA: Insufficient documentation

## 2019-04-16 DIAGNOSIS — M6281 Muscle weakness (generalized): Secondary | ICD-10-CM

## 2019-04-16 DIAGNOSIS — R2689 Other abnormalities of gait and mobility: Secondary | ICD-10-CM

## 2019-04-16 DIAGNOSIS — M545 Low back pain, unspecified: Secondary | ICD-10-CM

## 2019-04-16 NOTE — Therapy (Signed)
[place] MAIN [facility] SERVICES [address] [place], [place], [address] Phone: [phone]   Fax:  [phone]  Occupational Therapy Treatment  Patient Details  Name: Susan Callahan MRN: [id number] Date of Birth: 01-Nov-1950 Referring Provider (OT): Humberto Leep, MD   Encounter Date: 04/16/2019  OT End of Session - 04/16/19 1657    Visit Number  14    Number of Visits  36    Date for OT Re-Evaluation  05/20/19    OT Start Time  0320    OT Stop Time  0420    OT Time Calculation (min)  60 min    Activity Tolerance  Patient tolerated treatment well;No increased pain    Behavior During Therapy  WFL for tasks assessed/performed       Past Medical History:  Diagnosis Date  . COPD (chronic obstructive pulmonary disease) ([place])   . Depression 1974  . Diabetes mellitus without complication ([place])   . Hypertension   . Migraine   . 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 LUMBAR PERITONEAL SHUNT    . TYMPANOSTOMY TUBE PLACEMENT      There were no vitals filed for this visit.  Subjective Assessment - 04/16/19 1540    Subjective   Pt presents for OT visit 14/ 36 to address RUE/ RUQ post-mastectomy swelling at surgical site at seroma and axillary web syndrome.Pt reports she was able to see PCP today, who ruled out CHF as possible cause for sudden onset of L hand and BLE swelling. Discussed POC w Pt and decided to continue at current frequency to reduce risk of progression due to repeated stress on at risk quadrant due to recurrent infection, seroma, and now hand swelling. PCP confirmed extremity swelling is side effect of letrazole. Pt reports physician OK'd a drug holiday as tria for drug side effect.    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 01/30/19 partial mastectomy. Margins clear. 0/2 LN  negative for  disease. actinic keritosis,DDD, DM,  obesity, thyroid nodules, pulmonary nodules, chronic back pain, HTN, COPD, hx depression    Limitations  pain and swelling in R axilla and arm, chronic back pain, muscle weakness, impaired balance, decreased RUE AROM, abnormal gait, difficulty walking, impaired functional mobility and transfers,    Repetition  Increases Symptoms    Special Tests  -Stemmer at MPs bilaterally    Patient Stated Goals  relduce pain and improve movement  in my R arm so I can do things I need to do. reduce risk of lymphedema    Pain Onset  More than a month ago    Pain Onset  More than a month ago                   OT Treatments/Exercises (OP) - 04/16/19 0001      ADLs   ADL Education Given  Yes      Manual Therapy   Manual Therapy  Edema management;Manual Lymphatic Drainage (MLD);Compression Bandaging    Edema Management  Pt encouraged to hook R hand on belt loop or hold purse strapswhen walking to limit dependent positioning w/ centrifugal force of armswing to limit swelling    Manual Lymphatic Drainage (MLD)  MLD to RUE/ RUQ  utilizing ipsilateral axillo-inguinal watershed, and anterior and posterior axillary anastamoses. Pt tolerated well in supine and sidelying. No myofacial release today.  Compression Bandaging  small L OTS edema  glove with seams turned to inside as trial  compression garment to reduce hand swelling. Ordered SM R and will replace as soon as available             OT Education - 04/16/19 1655    Education Details  Pt edu re dependent positioning gravitational effect on swelling. Pt edu for OTS compression glove fitting, and wear and care regime    Person(s) Educated  Patient    Methods  Demonstration;Explanation;Tactile cues;Verbal cues;Handout    Comprehension  Verbalized understanding;Returned demonstration          OT Long Term Goals - 04/07/19 1741      OT LONG TERM GOAL #1   Title  Pt independent with lymphedema precautions and prevention strategies to  limit LE progression.    Baseline  Max A    Time  4    Period  Days    Status  Achieved      OT LONG TERM GOAL #2   Title  Pt will be independent with lymphedema self-care home program and AWS ther ex to improve functional performance of basic and instrumental ADLs by DC.    Baseline  Max A    Time  12    Period  Weeks    Status  Achieved      OT LONG TERM GOAL #3   Title  Pt wil report 0/10 pain with RUE functional use ( reaching, lifting, carrying, dressing, dressing, etc) to achieve independent , pain free ADLs performance.    Baseline  Max A    Time  12    Period  Weeks    Status  New            Plan - 04/16/19 1657    Clinical Impression Statement  R hand and distal arm swelling slightly decreased after MLD . Pt fitted with OTS compression glove  in effort to reduce swelling without wrapping the extremity. If this trial is   not effective, will commence compression wrapping to limit strain on compromised lymphatic system as Pt has experenced multiple exacerbating factors for progrression of subclinical LE recently. Pt has no complaints re cording today. No myofascial release to limit stress on axilla. Cont as per POC.    OT Occupational Profile and History  Comprehensive Assessment- Review of records and extensive additional review of physical, cognitive, psychosocial history related to current functional performance    Occupational performance deficits (Please refer to evaluation for details):  ADL's;IADL's;Social Participation;Leisure;Rest and Sleep;Work    Marketing executive / Function / Physical Skills  ADL;Decreased knowledge of precautions;ROM;UE functional use;Decreased knowledge of use of DME;Edema;Skin integrity;Pain;IADL    Rehab Potential  Good    Clinical Decision Making  Several treatment options, min-mod task modification necessary    Comorbidities Affecting Occupational Performance:  Presence of comorbidities impacting occupational performance    Comorbidities  impacting occupational performance description:  see SUBJUECTIVE    Modification or Assistance to Complete Evaluation   Min-Moderate modification of tasks or assist with assess necessary to complete eval    OT Frequency  2x / week    OT Duration  12 weeks    OT Treatment/Interventions  Self-care/ADL training;Therapeutic exercise;Manual lymph drainage;Patient/family education;Therapeutic activities;DME and/or AE instruction;Manual Therapy;Other (comment)   consider fitting with prophylactic cmopression arm sleeve   Plan  BUE AROM ther ex to reduce axillary cording- 2 sets of 10 2-3 x daily    Consulted and  Agree with Plan of Care  Patient       Patient will benefit from skilled therapeutic intervention in order to improve the following deficits and impairments:   Body Structure / Function / Physical Skills: ADL, Decreased knowledge of precautions, ROM, UE functional use, Decreased knowledge of use of DME, Edema, Skin integrity, Pain, IADL       Visit Diagnosis: 1. Post-mastectomy lymphedema syndrome       Problem List Patient Active Problem List   Diagnosis Date Noted  . Postoperative wound infection 03/13/2019  . Seroma of breast 03/11/2019  . Age-related osteoporosis without current pathological fracture 02/27/2019  . Malignant neoplasm of central portion of right breast in female, estrogen receptor positive ([place]) 02/12/2019  . Genetic testing 01/31/2019  . Malignant neoplasm of upper-inner quadrant of right breast in female, estrogen receptor positive ([place]) 01/15/2019  . Long term current use of opiate analgesic 12/02/2018  . Tympanic membrane central perforation, left 11/04/2018  . DDD (degenerative disc disease), lumbosacral 10/10/2018  . Abnormal MRI, lumbar spine (08/29/2018) 10/10/2018  . 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 Lumbar nerve root impingement (Bilateral: L4 & L5) (L>R: L5) 10/10/2018  . Lumbar Spinal instability  of L4-5 10/10/2018  . Lumbar facet hypertrophy 10/10/2018  . COPD (chronic obstructive pulmonary disease) with emphysema ([place]) 08/28/2018  . Elevated rheumatoid factor 08/13/2018  . Elevated C-reactive protein (CRP) 08/12/2018  . Elevated sed rate 08/12/2018  . Lumbar facet syndrome (Bilateral) 08/12/2018  . Back pain with left-sided radiculopathy 08/12/2018  . DDD (degenerative disc disease), lumbar 08/12/2018  . Chronic lower extremity pain (Primary Area of Pain) (Bilateral) (L>R) 07/23/2018  . Chronic low back pain (Secondary Area of Pain) (Bilateral) (L>R) w/ sciatica (Bilateral) 07/23/2018  . Chronic pain syndrome 07/23/2018  . Pharmacologic therapy 07/23/2018  . Disorder of skeletal system 07/23/2018  . Problems influencing health status 07/23/2018  . Chronic sacroiliac joint pain [facility] Area of Pain) (Bilateral) (L>R) 07/23/2018  . Status post lumboperitoneal shunt placement 02/26/2018  . 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 Tympanic membrane perforation 05/30/2016  . PMB (postmenopausal bleeding) 04/26/2016  . Rash 12/27/2015  . 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 Plantar fasciitis of foot (Left) 05/26/2015  . Spondylosis of lumbar region without myelopathy or radiculopathy 05/26/2015  . Lumbar foraminal stenosis (L3-4, L4-5, L5-S1) (L>R) 05/26/2015  . Spinal stenosis of lumbar region with neurogenic claudication 05/26/2015  . Encounter for screening mammogram for malignant neoplasm of breast 12/02/2014  . Goiter 12/02/2014  . Chronic idiopathic gout of foot (Right) 07/06/2014  . Biceps tendonitis (Right) 05/19/2014  . Shoulder pain (Right) 05/19/2014  . Facial weakness 05/05/2014  . Post herpetic neuralgia 05/05/2014  . Chronic dysfunction of left eustachian tube 03/24/2014  . Osteopenia 03/13/2014  . Lumbar Grade 1 Anterolisthesis of L3/4 (74mm) & L4/5 (4-30mm)(w/ Dynamic instability)  03/13/2014  . Family history of colon cancer 12/02/2013  . Dyshidrotic eczema 11/17/2013  . Diastasis recti 04/08/2013  . Facial nerve paresis 07/31/2012  . Blepharospasm 05/08/2012  . 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 Obesity 03/28/2012  . Hemoptysis 12/22/2011  . Neuropathic postherpetic trigeminal neuralgia 12/12/2011  . Allergic rhinitis, mild 10/12/2011  . Disequilibrium syndrome 09/27/2011  . Chronic bronchitis ([place]) 06/05/2011  . Essential hypertension 05/24/2011  . Type 2 diabetes mellitus without complications ([place]) 73/53/2992  . Pulmonary nodules 05/05/2011  Andrey Spearman, MS, OTR/L, [facility] 04/16/19 5:01 PM  [place] MAIN [facility] SERVICES [address] [place], [place], [address] Phone: [phone]   Fax:  [phone]  Name: Verlean Allport MRN: [id number] Date of Birth: 10/29/1950

## 2019-04-16 NOTE — Therapy (Signed)
[place] MAIN [facility] SERVICES [address] [place], [place], [address] Phone: [phone]   Fax:  [phone]  Physical Therapy Treatment  Patient Details  Name: Susan Callahan MRN: [id number] Date of Birth: Oct 27, 1950 Referring Provider (PT): Francesco Sor   Encounter Date: 04/16/2019  PT End of Session - 04/16/19 1430    Visit Number  75    Number of Visits  53    Date for PT Re-Evaluation  05/12/19    Authorization Type  6/10 PN on 03/24/19    PT Start Time  1430    PT Stop Time  1515    PT Time Calculation (min)  45 min    Activity Tolerance  Patient tolerated treatment well    Behavior During Therapy  [facility] for tasks assessed/performed       Past Medical History:  Diagnosis Date  . COPD (chronic obstructive pulmonary disease) ([place])   . Depression 1974  . Diabetes mellitus without complication ([place])   . Hypertension   . Migraine   . 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 LUMBAR PERITONEAL SHUNT    . TYMPANOSTOMY TUBE PLACEMENT      There were no vitals filed for this visit.  Subjective Assessment - 04/16/19 1434    Subjective  Patient reported that shes seen her doctor and does think that her swelling is due to lymphedema    Pertinent History  Pain began in 2007 and has gradually got worse and has progressed to nonstop. Patient states pain last all day long, everyday with no change in symptoms. Medication helps briefly. States she has difficulty with showering and standing. States she has to stand with her knee bent due to pulling sensation in legs. Pain begins immediately when stands and begins to walk. Pain feels better when she bends her knees when standing. Pain became constant and non stop around Dec 2018. Denies bowel or bladder changes, unexplained weakness, or unexpected weight loss/gain. She currently goes to pulmonary rehab 3x a week, but has increased pain with  treadmill walking. Will walk with cane on occasion around house especially at night due to occasional dizziness and vision impairments in medical history. States she also has difficulty with carrying groceries up steps due to low back pain. She would like to be able to walk better and have less pain to be less reliant on medication.     Limitations  Lifting;Standing;Walking;House hold activities    How long can you stand comfortably?  pain begins immediately     How long can you walk comfortably?  pain begins immediately     Patient Stated Goals  walk better, have less pain    Currently in Pain?  Yes    Pain Score  2     Pain Location  Back    Pain Orientation  Lower    Pain Descriptors / Indicators  Aching    Pain Onset  More than a month ago         Manual Therapy  Roller stick for muscle tissue lengthening/soft tissue release to lumbar paraspinals and piriformis/gluteal musculature Roller stick to hamstring and calf for muscle tissue lengthening/soft tissue release   SAD in figure 4 position with belt BLE 6x 20 seconds inferior direction    Ther-ex: cueing for sequencing, positioning and muscle activation   Posterior pelvic tilts x15. tactile and verbal cue for appropriate technique 5 seconds with  hold, improved alignment with TrA activation hooklying 10x 5 second holds Lower trunk rotation x10 bilaterally (pt with one hamstring cramp on L during exercises, assisted with hamstring stretch to address)  Manual Therapy  L/R single knee to chest stretch x 30s each LE; Hamstring stretch in supine with increasing range 60 seconds each LE    Patient response/clinical impression: Patient with excellent motivation during therapy today. Reported pain decreased and pt felt much looser after session today. Pt also demonstrated improved gait mechanics and velocity s/p session. The patient would benefit from further skilled PT intervention to maximize QOL and functional abilities.   PT Education  - 04/16/19 1429    Education Details  exercise technique, manual information    Person(s) Educated  Patient    Methods  Explanation;Demonstration;Tactile cues;Verbal cues    Comprehension  Verbalized understanding;Returned demonstration;Verbal cues required;Tactile cues required       PT Short Term Goals - 04/14/19 1615      PT SHORT TERM GOAL #1   Title  Patient will be independent with completion of HEP to improve ability to complete functional tasks and functional ADLs.    Baseline  10/23: will give HEP next session 11/19: HEP compliance    Time  2    Period  Weeks    Status  Achieved      PT SHORT TERM GOAL #2   Title  Patient will report pain score less than 4/10 on VAS to demonstrate reduction of pain levels with functional tasks.     Baseline  10/23: 6/10 11/19: 5/10 12/12: 9/10 1/10: 7/10  2/11: 3/10     Time  2    Period  Weeks    Status  Achieved        PT Long Term Goals - 04/14/19 1616      PT LONG TERM GOAL #1   Title  Patient will report pain score of <3/10 on VAS for decreased pain levels and ease with functional activities.    Baseline  10/23: 6/10 11/19: 5/10 12/12 9/10 1/10: 7/10 2/11: 3/10 3/9: 6/10  5/26: 8/10 03/17/19: 7/10 8/3: 5/10    Time  4    Period  Weeks    Status  Partially Met    Target Date  05/12/19      PT LONG TERM GOAL #2   Title  Patient will be able to ambulate on treadmill for 63mnutes without increase in back/leg pain demonstrating improved community ambulation and treadmill walking at pulmonary rehab    Baseline  10/23: 07mutes increases pain 11/19: [id number] 12/12: has not had the chance to perform 1/10: was able to do 5 minutes without pain 2/11: able to do 5 minutes then had to bend over 3/9: 5 minutes 30 seconds at 2.0 mphs 5/26: had to walk 10 minutes at cancer center with extreme pain but was able to perform 7/6: walks 10 minutes with increase of pain by 2 points 8/3: able to perform with bent over posture    Time  4    Period   Weeks    Status  Achieved      PT LONG TERM GOAL #3   Title  Patient will have decreased modified oswestry score <50% for improved ability to complete household tasks with less pain levels.     Baseline  10/23: 62% 11/19: 42%      Time  4    Period  Weeks    Status  Achieved  PT LONG TERM GOAL #4   Title  Patient will score 4+/5 on BLE MMT to demonstrate improved glute strength and ability to carry groceries with increased ease up/down stairs at home.    Baseline  10/23: 4-/5 grossly 11/19: 4/5 grossly; hamstrings 4-/5 12/12: 4/5 gross 1/10: 4/5 gross 2/11: gross 4+/5 with hamstrings and abuductors 4-/5 3/9: gross 4+/5 hamstrings and abductors 4-/5 5/27: grossly 3+/5 with pain with LLE movements 7/6: grossly 4-/5 with pain with LLE movement 8/3: deferred due to swelling/pain    Time  4    Period  Weeks    Status  Deferred    Target Date  05/12/19      PT LONG TERM GOAL #5   Title  Patient will have decreased modified oswestry score <35% for improved ability to complete household tasks with less pain levels.     Baseline  11/19: 42% 12/12: 52% 1/10: 48%  2/11: 30%     Time  4    Period  Weeks    Status  Achieved      Additional Long Term Goals   Additional Long Term Goals  Yes      PT LONG TERM GOAL #6   Title  Patient will be able to make and/or strip the bed without pain and without needing rest breaks to perform iADLs independently.     Baseline  2/11: pain limits ability to perform, ~4 breaks required 3/9: takes her time, 3 breaks 5/27: very painful still 7/6: requires only one rest break 8/3: able to perform with +2 pain increase    Time  4    Period  Weeks    Status  Partially Met    Target Date  05/12/19      PT LONG TERM GOAL #7   Title  Patient will have decreased modified oswestry score <20% for improved ability to complete household tasks with less pain levels.     Baseline  2/11: 30%  3/9: 30% 5/27: 63% 7/6: 38% 8/3: 42%    Time  4    Period  Weeks    Status   On-going    Target Date  05/12/19      PT LONG TERM GOAL #8   Title  Patient will tolerate >10 minutes of upright postural positioning of trunk with active mobility with <2 degrees VAS increase.    Baseline  8/3: unable to tolerate 10  minutes    Time  4    Period  Weeks    Status  New    Target Date  05/12/19            Plan - 04/16/19 1436    Clinical Impression Statement  Patient with excellent motivation during therapy today. Reported pain decreased and pt felt much looser after session today. Pt also demonstrated improved gait mechanics and velocity s/p session. The patient would benefit from further skilled PT intervention to maximize QOL and functional abilities.    Rehab Potential  Fair    Clinical Impairments Affecting Rehab Potential  (+) motivation (-) chronicity of symptoms    PT Frequency  2x / week    PT Duration  4 weeks    PT Treatment/Interventions  ADLs/Self Care Home Management;Cryotherapy;Moist Heat;Traction;Iontophoresis 49m/ml Dexamethasone;Ultrasound;Gait training;Stair training;Functional mobility training;Neuromuscular re-education;Balance training;Therapeutic exercise;Therapeutic activities;Patient/family education;Manual techniques;Energy conservation;Aquatic Therapy;Electrical Stimulation;Passive range of motion;Dry needling;Taping    PT Next Visit Plan  progress strengthening and postural mechanics    PT Home Exercise Plan  No updates  this day    Consulted and Agree with Plan of Care  Patient       Patient will benefit from skilled therapeutic intervention in order to improve the following deficits and impairments:  Abnormal gait, Cardiopulmonary status limiting activity, Decreased activity tolerance, Decreased balance, Decreased endurance, Decreased range of motion, Difficulty walking, Decreased strength, Hypomobility, Decreased mobility, Impaired perceived functional ability, Impaired flexibility, Postural dysfunction, Obesity, Improper body mechanics,  Pain  Visit Diagnosis: 1. Other abnormalities of gait and mobility   2. Chronic low back pain, unspecified back pain laterality, unspecified whether sciatica present   3. Abnormal posture   4. Muscle weakness (generalized)        Problem List Patient Active Problem List   Diagnosis Date Noted  . Postoperative wound infection 03/13/2019  . Seroma of breast 03/11/2019  . Age-related osteoporosis without current pathological fracture 02/27/2019  . Malignant neoplasm of central portion of right breast in female, estrogen receptor positive ([place]) 02/12/2019  . Genetic testing 01/31/2019  . Malignant neoplasm of upper-inner quadrant of right breast in female, estrogen receptor positive ([place]) 01/15/2019  . Long term current use of opiate analgesic 12/02/2018  . Tympanic membrane central perforation, left 11/04/2018  . DDD (degenerative disc disease), lumbosacral 10/10/2018  . Abnormal MRI, lumbar spine (08/29/2018) 10/10/2018  . 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 Lumbar nerve root impingement (Bilateral: L4 & L5) (L>R: L5) 10/10/2018  . Lumbar Spinal instability of L4-5 10/10/2018  . Lumbar facet hypertrophy 10/10/2018  . COPD (chronic obstructive pulmonary disease) with emphysema ([place]) 08/28/2018  . Elevated rheumatoid factor 08/13/2018  . Elevated C-reactive protein (CRP) 08/12/2018  . Elevated sed rate 08/12/2018  . Lumbar facet syndrome (Bilateral) 08/12/2018  . Back pain with left-sided radiculopathy 08/12/2018  . DDD (degenerative disc disease), lumbar 08/12/2018  . Chronic lower extremity pain (Primary Area of Pain) (Bilateral) (L>R) 07/23/2018  . Chronic low back pain (Secondary Area of Pain) (Bilateral) (L>R) w/ sciatica (Bilateral) 07/23/2018  . Chronic pain syndrome 07/23/2018  . Pharmacologic therapy 07/23/2018  . Disorder of skeletal system 07/23/2018  . Problems influencing health status 07/23/2018  . Chronic sacroiliac  joint pain [facility] Area of Pain) (Bilateral) (L>R) 07/23/2018  . Status post lumboperitoneal shunt placement 02/26/2018  . 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 Tympanic membrane perforation 05/30/2016  . PMB (postmenopausal bleeding) 04/26/2016  . Rash 12/27/2015  . 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 Plantar fasciitis of foot (Left) 05/26/2015  . Spondylosis of lumbar region without myelopathy or radiculopathy 05/26/2015  . Lumbar foraminal stenosis (L3-4, L4-5, L5-S1) (L>R) 05/26/2015  . Spinal stenosis of lumbar region with neurogenic claudication 05/26/2015  . Encounter for screening mammogram for malignant neoplasm of breast 12/02/2014  . Goiter 12/02/2014  . Chronic idiopathic gout of foot (Right) 07/06/2014  . Biceps tendonitis (Right) 05/19/2014  . Shoulder pain (Right) 05/19/2014  . Facial weakness 05/05/2014  . Post herpetic neuralgia 05/05/2014  . Chronic dysfunction of left eustachian tube 03/24/2014  . Osteopenia 03/13/2014  . Lumbar Grade 1 Anterolisthesis of L3/4 (77m) & L4/5 (4-169m(w/ Dynamic instability) 03/13/2014  . Family history of colon cancer 12/02/2013  . Dyshidrotic eczema 11/17/2013  . Diastasis recti 04/08/2013  . Facial nerve paresis 07/31/2012  . Blepharospasm 05/08/2012  . 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 Obesity 03/28/2012  . Hemoptysis 12/22/2011  . Neuropathic  postherpetic trigeminal neuralgia 12/12/2011  . Allergic rhinitis, mild 10/12/2011  . Disequilibrium syndrome 09/27/2011  . Chronic bronchitis ([place]) 06/05/2011  . Essential hypertension 05/24/2011  . Type 2 diabetes mellitus without complications ([place]) 19/62/2297  . Pulmonary nodules 05/05/2011    Lieutenant Diego PT, DPT 3:30 PM,04/16/19 [place] MAIN [facility] SERVICES [address] [place],  [place], [address] Phone: [phone]   Fax:  [phone]  Name: Vitoria Conyer MRN: [id number] Date of Birth: 1951-08-15

## 2019-04-17 ENCOUNTER — Ambulatory Visit: Payer: Medicare Other | Admitting: Occupational Therapy

## 2019-04-17 ENCOUNTER — Other Ambulatory Visit: Payer: Self-pay

## 2019-04-17 DIAGNOSIS — I972 Postmastectomy lymphedema syndrome: Secondary | ICD-10-CM

## 2019-04-17 DIAGNOSIS — M545 Low back pain: Secondary | ICD-10-CM | POA: Diagnosis not present

## 2019-04-17 NOTE — Therapy (Signed)
[place] MAIN [facility] SERVICES [address] [place], [place], [address] Phone: [phone]   Fax:  [phone]  Occupational Therapy Treatment  Patient Details  Name: Susan Callahan MRN: [id number] Date of Birth: [birth year] Referring Provider (OT): Humberto Leep, MD   Encounter Date: 04/17/2019  OT End of Session - 04/17/19 1642    Visit Number  15    Number of Visits  36    Date for OT Re-Evaluation  05/20/19    OT Start Time  0315    OT Stop Time  0415    OT Time Calculation (min)  60 min    Activity Tolerance  Patient tolerated treatment well;No increased pain    Behavior During Therapy  WFL for tasks assessed/performed       Past Medical History:  Diagnosis Date  . COPD (chronic obstructive pulmonary disease) ([place])   . Depression 1974  . Diabetes mellitus without complication ([place])   . Hypertension   . Migraine   . 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 LUMBAR PERITONEAL SHUNT    . TYMPANOSTOMY TUBE PLACEMENT      There were no vitals filed for this visit.  Subjective Assessment - 04/17/19 1521    Subjective   Pt presents for OT visit 15/ 36 to address RUE/ RUQ post-mastectomy swelling at surgical site at seroma and axillary web syndrome.Pt reports she was able to see PCP today, who ruled out CHF as possible cause for sudden onset of L hand and BLE swelling. Pt  reposrts hand swelling is better today.    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 01/30/19 partial mastectomy. Margins clear. 0/2 LN  negative for  disease. actinic keritosis,DDD, DM, obesity, thyroid nodules, pulmonary nodules, chronic back pain, HTN, COPD, hx depression    Limitations  pain and swelling in R axilla and arm, chronic back pain, muscle weakness, impaired balance, decreased RUE AROM, abnormal gait, difficulty walking, impaired functional mobility and  transfers,    Repetition  Increases Symptoms    Special Tests  -Stemmer at MPs bilaterally    Patient Stated Goals  relduce pain and improve movement  in my R arm so I can do things I need to do. reduce risk of lymphedema    Pain Onset  More than a month ago    Pain Onset  More than a month ago                           OT Education - 04/17/19 1642    Education Details  Pt edu for LE self care    Person(s) Educated  Patient    Methods  Demonstration;Explanation;Tactile cues;Verbal cues;Handout    Comprehension  Verbalized understanding;Returned demonstration          OT Long Term Goals - 04/07/19 1741      OT LONG TERM GOAL #1   Title  Pt independent with lymphedema precautions and prevention strategies to limit LE progression.    Baseline  Max A    Time  4    Period  Days    Status  Achieved      OT LONG TERM GOAL #2   Title  Pt will be independent with lymphedema self-care home program and AWS ther ex to improve functional performance of basic and instrumental ADLs by DC.  Baseline  Max A    Time  12    Period  Weeks    Status  Achieved      OT LONG TERM GOAL #3   Title  Pt wil report 0/10 pain with RUE functional use ( reaching, lifting, carrying, dressing, dressing, etc) to achieve independent , pain free ADLs performance.    Baseline  Max A    Time  12    Period  Weeks    Status  New            Plan - 04/17/19 1643    Clinical Impression Statement  Response to MLD to RUE/RUQ w/ emphasis on decongesting hand noted with sligjht reduction of swelling and tissue density between metatarsals on dorsal hand. Increased skin wrinkles are indicative of decreased fluid pressue after treatment,. but swelling persists. Today is 4th day of letrazole holiday. Pt  using edema glove as instructed. Good compliance. Cont as per POC.    OT Occupational Profile and History  Comprehensive Assessment- Review of records and extensive additional review of  physical, cognitive, psychosocial history related to current functional performance    Occupational performance deficits (Please refer to evaluation for details):  ADL's;IADL's;Social Participation;Leisure;Rest and Sleep;Work    Marketing executive / Function / Physical Skills  ADL;Decreased knowledge of precautions;ROM;UE functional use;Decreased knowledge of use of DME;Edema;Skin integrity;Pain;IADL    Rehab Potential  Good    Clinical Decision Making  Several treatment options, min-mod task modification necessary    Comorbidities Affecting Occupational Performance:  Presence of comorbidities impacting occupational performance    Comorbidities impacting occupational performance description:  see SUBJUECTIVE    Modification or Assistance to Complete Evaluation   Min-Moderate modification of tasks or assist with assess necessary to complete eval    OT Frequency  2x / week    OT Duration  12 weeks    OT Treatment/Interventions  Self-care/ADL training;Therapeutic exercise;Manual lymph drainage;Patient/family education;Therapeutic activities;DME and/or AE instruction;Manual Therapy;Other (comment)   consider fitting with prophylactic cmopression arm sleeve   Plan  BUE AROM ther ex to reduce axillary cording- 2 sets of 10 2-3 x daily    Consulted and Agree with Plan of Care  Patient       Patient will benefit from skilled therapeutic intervention in order to improve the following deficits and impairments:   Body Structure / Function / Physical Skills: ADL, Decreased knowledge of precautions, ROM, UE functional use, Decreased knowledge of use of DME, Edema, Skin integrity, Pain, IADL       Visit Diagnosis: 1. Postmastectomy lymphedema syndrome       Problem List Patient Active Problem List   Diagnosis Date Noted  . Postoperative wound infection 03/13/2019  . Seroma of breast 03/11/2019  . Age-related osteoporosis without current pathological fracture 02/27/2019  . Malignant neoplasm of  central portion of right breast in female, estrogen receptor positive ([place]) 02/12/2019  . Genetic testing 01/31/2019  . Malignant neoplasm of upper-inner quadrant of right breast in female, estrogen receptor positive ([place]) 01/15/2019  . Long term current use of opiate analgesic 12/02/2018  . Tympanic membrane central perforation, left 11/04/2018  . DDD (degenerative disc disease), lumbosacral 10/10/2018  . Abnormal MRI, lumbar spine (08/29/2018) 10/10/2018  . 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 Lumbar nerve root impingement (Bilateral: L4 & L5) (L>R: L5) 10/10/2018  . Lumbar Spinal instability of L4-5 10/10/2018  . Lumbar facet hypertrophy 10/10/2018  . COPD (chronic obstructive pulmonary disease) with  emphysema ([place]) 08/28/2018  . Elevated rheumatoid factor 08/13/2018  . Elevated C-reactive protein (CRP) 08/12/2018  . Elevated sed rate 08/12/2018  . Lumbar facet syndrome (Bilateral) 08/12/2018  . Back pain with left-sided radiculopathy 08/12/2018  . DDD (degenerative disc disease), lumbar 08/12/2018  . Chronic lower extremity pain (Primary Area of Pain) (Bilateral) (L>R) 07/23/2018  . Chronic low back pain (Secondary Area of Pain) (Bilateral) (L>R) w/ sciatica (Bilateral) 07/23/2018  . Chronic pain syndrome 07/23/2018  . Pharmacologic therapy 07/23/2018  . Disorder of skeletal system 07/23/2018  . Problems influencing health status 07/23/2018  . Chronic sacroiliac joint pain [facility] Area of Pain) (Bilateral) (L>R) 07/23/2018  . Status post lumboperitoneal shunt placement 02/26/2018  . 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 Tympanic membrane perforation 05/30/2016  . PMB (postmenopausal bleeding) 04/26/2016  . Rash 12/27/2015  . 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 Plantar fasciitis of foot (Left) 05/26/2015  . Spondylosis of lumbar region without myelopathy or  radiculopathy 05/26/2015  . Lumbar foraminal stenosis (L3-4, L4-5, L5-S1) (L>R) 05/26/2015  . Spinal stenosis of lumbar region with neurogenic claudication 05/26/2015  . Encounter for screening mammogram for malignant neoplasm of breast 12/02/2014  . Goiter 12/02/2014  . Chronic idiopathic gout of foot (Right) 07/06/2014  . Biceps tendonitis (Right) 05/19/2014  . Shoulder pain (Right) 05/19/2014  . Facial weakness 05/05/2014  . Post herpetic neuralgia 05/05/2014  . Chronic dysfunction of left eustachian tube 03/24/2014  . Osteopenia 03/13/2014  . Lumbar Grade 1 Anterolisthesis of L3/4 (94mm) & L4/5 (4-33mm)(w/ Dynamic instability) 03/13/2014  . Family history of colon cancer 12/02/2013  . Dyshidrotic eczema 11/17/2013  . Diastasis recti 04/08/2013  . Facial nerve paresis 07/31/2012  . Blepharospasm 05/08/2012  . 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 Obesity 03/28/2012  . Hemoptysis 12/22/2011  . Neuropathic postherpetic trigeminal neuralgia 12/12/2011  . Allergic rhinitis, mild 10/12/2011  . Disequilibrium syndrome 09/27/2011  . Chronic bronchitis ([place]) 06/05/2011  . Essential hypertension 05/24/2011  . Type 2 diabetes mellitus without complications ([place]) 88/32/5498  . Pulmonary nodules 05/05/2011    Andrey Spearman, MS, OTR/L, [facility] 04/17/19 4:47 PM  [place] MAIN [facility] SERVICES [address] [place], [place], [address] Phone: [phone]   Fax:  [phone]  Name: Lien Lyman MRN: [id number] Date of Birth: [birth year]

## 2019-04-21 ENCOUNTER — Ambulatory Visit: Payer: Medicare Other

## 2019-04-21 ENCOUNTER — Ambulatory Visit: Payer: Medicare Other | Admitting: Occupational Therapy

## 2019-04-23 ENCOUNTER — Other Ambulatory Visit: Payer: Self-pay

## 2019-04-23 ENCOUNTER — Ambulatory Visit: Payer: Medicare Other | Admitting: Occupational Therapy

## 2019-04-23 ENCOUNTER — Ambulatory Visit: Payer: Medicare Other

## 2019-04-23 DIAGNOSIS — R293 Abnormal posture: Secondary | ICD-10-CM

## 2019-04-23 DIAGNOSIS — G8929 Other chronic pain: Secondary | ICD-10-CM

## 2019-04-23 DIAGNOSIS — M545 Low back pain: Secondary | ICD-10-CM | POA: Diagnosis not present

## 2019-04-23 DIAGNOSIS — R2689 Other abnormalities of gait and mobility: Secondary | ICD-10-CM

## 2019-04-23 DIAGNOSIS — I972 Postmastectomy lymphedema syndrome: Secondary | ICD-10-CM

## 2019-04-23 DIAGNOSIS — M6281 Muscle weakness (generalized): Secondary | ICD-10-CM

## 2019-04-23 NOTE — Therapy (Signed)
[place] MAIN [facility] SERVICES [address] [place], [place], [address] Phone: [phone]   Fax:  [phone]  Occupational Therapy Treatment  Patient Details  Name: Susan Callahan MRN: [id number] Date of Birth: [birth year] Referring Provider (OT): Humberto Leep, MD   Encounter Date: 04/23/2019  OT End of Session - 04/23/19 1744    Visit Number  16    Number of Visits  36    Date for OT Re-Evaluation  05/20/19    OT Start Time  0315    OT Stop Time  0415    OT Time Calculation (min)  60 min    Activity Tolerance  Patient tolerated treatment well;No increased pain    Behavior During Therapy  WFL for tasks assessed/performed       Past Medical History:  Diagnosis Date  . COPD (chronic obstructive pulmonary disease) ([place])   . Depression 1974  . Diabetes mellitus without complication ([place])   . Hypertension   . Migraine   . 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 LUMBAR PERITONEAL SHUNT    . TYMPANOSTOMY TUBE PLACEMENT      There were no vitals filed for this visit.  Subjective Assessment - 04/23/19 1517    Subjective   Pt presents for OT visit 15/ 36 to address RUE/ RUQ post-mastectomy swelling at surgical site at seroma and axillary web syndrome.Pt reports she was able to see PCP today, who ruled out CHF as possible cause for sudden onset of L hand and BLE swelling. Pt  reposrts hand swelling is better today.    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 01/30/19 partial mastectomy. Margins clear. 0/2 LN  negative for  disease. actinic keritosis,DDD, DM, obesity, thyroid nodules, pulmonary nodules, chronic back pain, HTN, COPD, hx depression    Limitations  pain and swelling in R axilla and arm, chronic back pain, muscle weakness, impaired balance, decreased RUE AROM, abnormal gait, difficulty walking, impaired functional mobility and  transfers,    Repetition  Increases Symptoms    Special Tests  -Stemmer at MPs bilaterally    Patient Stated Goals  relduce pain and improve movement  in my R arm so I can do things I need to do. reduce risk of lymphedema    Pain Onset  More than a month ago    Pain Onset  More than a month ago          LYMPHEDEMA/ONCOLOGY QUESTIONNAIRE - 04/23/19 1745      Right Upper Extremity Lymphedema   Other  RUE limb volume = 2843.99 ml    Other  Limb volume is increased by 3.09% since last measured on 04/14/19. overall in affected RUE , on average, by 3.09%.. The volumetric calculation, however, does not capture the distribution or density of the swelling , mainly in the hand and distal FA.              OT Treatments/Exercises (OP) - 04/23/19 0001      ADLs   ADL Education Given  Yes      Manual Therapy   Manual Therapy  Edema management;Manual Lymphatic Drainage (MLD);Compression Bandaging;Myofascial release    Edema Management  comparative limb volumetrics RUE    Manual Lymphatic Drainage (MLD)  MLD to RUE/ RUQ  utilizing ipsilateral axillo-inguinal watershed, and anterior and posterior axillary anastamoses. Pt tolerated well in supine and sidelying.  No myofacial release today.    Compression Bandaging  custom cowrap glove applied in clinic. Released nylon hem stitch in OTS edema glove. No arm wrap             OT Education - 04/23/19 1743    Education Details  Pt edu for LE self care    Person(s) Educated  Patient    Methods  Demonstration;Explanation;Tactile cues;Verbal cues;Handout    Comprehension  Verbalized understanding;Returned demonstration          OT Long Term Goals - 04/07/19 1741      OT LONG TERM GOAL #1   Title  Pt independent with lymphedema precautions and prevention strategies to limit LE progression.    Baseline  Max A    Time  4    Period  Days    Status  Achieved      OT LONG TERM GOAL #2   Title  Pt will be independent with lymphedema  self-care home program and AWS ther ex to improve functional performance of basic and instrumental ADLs by DC.    Baseline  Max A    Time  12    Period  Weeks    Status  Achieved      OT LONG TERM GOAL #3   Title  Pt wil report 0/10 pain with RUE functional use ( reaching, lifting, carrying, dressing, dressing, etc) to achieve independent , pain free ADLs performance.    Baseline  Max A    Time  12    Period  Weeks    Status  New            Plan - 04/23/19 1746    Clinical Impression Statement  RUE and hand  are unchanged in terms of swelling today by visiual assessment. Comparative limb volumetrics reveal Limb volume is increased by 3.09% since last measured on 04/14/19. Hand swelling is essentially unchanged. Swelling increase is observed from mid forearm to mid arm in same area as lymphatic cording is visible today. Pt tolerated myofacial release at volar vorearm   . Three cords are released, but 3 large cords remain visible and painful for patient in antecubital fossa. Applied custom fabricated compression glove   made of light, thin cowrap. Pt returns tomorrow. Cont as per POC 2 x weekly to address RUE/RUQ swelling and AWS.    OT Occupational Profile and History  Comprehensive Assessment- Review of records and extensive additional review of physical, cognitive, psychosocial history related to current functional performance    Occupational performance deficits (Please refer to evaluation for details):  ADL's;IADL's;Social Participation;Leisure;Rest and Sleep;Work    Marketing executive / Function / Physical Skills  ADL;Decreased knowledge of precautions;ROM;UE functional use;Decreased knowledge of use of DME;Edema;Skin integrity;Pain;IADL    Rehab Potential  Good    Clinical Decision Making  Several treatment options, min-mod task modification necessary    Comorbidities Affecting Occupational Performance:  Presence of comorbidities impacting occupational performance    Comorbidities  impacting occupational performance description:  see SUBJUECTIVE    Modification or Assistance to Complete Evaluation   Min-Moderate modification of tasks or assist with assess necessary to complete eval    OT Frequency  2x / week    OT Duration  12 weeks    OT Treatment/Interventions  Self-care/ADL training;Therapeutic exercise;Manual lymph drainage;Patient/family education;Therapeutic activities;DME and/or AE instruction;Manual Therapy;Other (comment)   consider fitting with prophylactic cmopression arm sleeve   Plan  BUE AROM ther ex to reduce axillary cording- 2 sets of  10 2-3 x daily    Consulted and Agree with Plan of Care  Patient       Patient will benefit from skilled therapeutic intervention in order to improve the following deficits and impairments:   Body Structure / Function / Physical Skills: ADL, Decreased knowledge of precautions, ROM, UE functional use, Decreased knowledge of use of DME, Edema, Skin integrity, Pain, IADL       Visit Diagnosis: 1. Postmastectomy lymphedema syndrome       Problem List Patient Active Problem List   Diagnosis Date Noted  . Postoperative wound infection 03/13/2019  . Seroma of breast 03/11/2019  . Age-related osteoporosis without current pathological fracture 02/27/2019  . Malignant neoplasm of central portion of right breast in female, estrogen receptor positive ([place]) 02/12/2019  . Genetic testing 01/31/2019  . Malignant neoplasm of upper-inner quadrant of right breast in female, estrogen receptor positive ([place]) 01/15/2019  . Long term current use of opiate analgesic 12/02/2018  . Tympanic membrane central perforation, left 11/04/2018  . DDD (degenerative disc disease), lumbosacral 10/10/2018  . Abnormal MRI, lumbar spine (08/29/2018) 10/10/2018  . 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 Lumbar nerve root impingement (Bilateral: L4 & L5) (L>R: L5) 10/10/2018  . Lumbar Spinal instability  of L4-5 10/10/2018  . Lumbar facet hypertrophy 10/10/2018  . COPD (chronic obstructive pulmonary disease) with emphysema ([place]) 08/28/2018  . Elevated rheumatoid factor 08/13/2018  . Elevated C-reactive protein (CRP) 08/12/2018  . Elevated sed rate 08/12/2018  . Lumbar facet syndrome (Bilateral) 08/12/2018  . Back pain with left-sided radiculopathy 08/12/2018  . DDD (degenerative disc disease), lumbar 08/12/2018  . Chronic lower extremity pain (Primary Area of Pain) (Bilateral) (L>R) 07/23/2018  . Chronic low back pain (Secondary Area of Pain) (Bilateral) (L>R) w/ sciatica (Bilateral) 07/23/2018  . Chronic pain syndrome 07/23/2018  . Pharmacologic therapy 07/23/2018  . Disorder of skeletal system 07/23/2018  . Problems influencing health status 07/23/2018  . Chronic sacroiliac joint pain [facility] Area of Pain) (Bilateral) (L>R) 07/23/2018  . Status post lumboperitoneal shunt placement 02/26/2018  . 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 Tympanic membrane perforation 05/30/2016  . PMB (postmenopausal bleeding) 04/26/2016  . Rash 12/27/2015  . 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 Plantar fasciitis of foot (Left) 05/26/2015  . Spondylosis of lumbar region without myelopathy or radiculopathy 05/26/2015  . Lumbar foraminal stenosis (L3-4, L4-5, L5-S1) (L>R) 05/26/2015  . Spinal stenosis of lumbar region with neurogenic claudication 05/26/2015  . Encounter for screening mammogram for malignant neoplasm of breast 12/02/2014  . Goiter 12/02/2014  . Chronic idiopathic gout of foot (Right) 07/06/2014  . Biceps tendonitis (Right) 05/19/2014  . Shoulder pain (Right) 05/19/2014  . Facial weakness 05/05/2014  . Post herpetic neuralgia 05/05/2014  . Chronic dysfunction of left eustachian tube 03/24/2014  . Osteopenia 03/13/2014  . Lumbar Grade 1 Anterolisthesis of L3/4 (70mm) & L4/5 (4-63mm)(w/ Dynamic instability)  03/13/2014  . Family history of colon cancer 12/02/2013  . Dyshidrotic eczema 11/17/2013  . Diastasis recti 04/08/2013  . Facial nerve paresis 07/31/2012  . Blepharospasm 05/08/2012  . 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 Obesity 03/28/2012  . Hemoptysis 12/22/2011  . Neuropathic postherpetic trigeminal neuralgia 12/12/2011  . Allergic rhinitis, mild 10/12/2011  . Disequilibrium syndrome 09/27/2011  . Chronic bronchitis ([place]) 06/05/2011  . Essential hypertension 05/24/2011  . Type 2 diabetes mellitus  without complications ([place]) 09/14/457  . Pulmonary nodules 05/05/2011   Andrey Spearman, MS, OTR/L, [facility] 04/23/19 5:51 PM  [place] MAIN [facility] SERVICES [address] [place], [place], [address] Phone: [phone]   Fax:  [phone]  Name: Susan Callahan MRN: [id number] Date of Birth: 1950/09/22

## 2019-04-23 NOTE — Therapy (Signed)
[place] MAIN [facility] SERVICES [address] [place], [place], [address] Phone: [phone]   Fax:  [phone]  Physical Therapy Treatment  Patient Details  Name: Susan Callahan MRN: [id number] Date of Birth: 1950/09/17 Referring Provider (PT): Francesco Sor   Encounter Date: 04/23/2019  PT End of Session - 04/23/19 1450    Visit Number  47    Number of Visits  4    Date for PT Re-Evaluation  05/12/19    Authorization Type  7/10 PN on 03/24/19    PT Start Time  1430    PT Stop Time  1514    PT Time Calculation (min)  44 min    Activity Tolerance  Patient tolerated treatment well    Behavior During Therapy  [facility] for tasks assessed/performed       Past Medical History:  Diagnosis Date  . COPD (chronic obstructive pulmonary disease) ([place])   . Depression 1974  . Diabetes mellitus without complication ([place])   . Hypertension   . Migraine   . 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 LUMBAR PERITONEAL SHUNT    . TYMPANOSTOMY TUBE PLACEMENT      There were no vitals filed for this visit.  Subjective Assessment - 04/23/19 1449    Subjective  Patient reports her swelling is still present. Legs are aching potentially due to swelling however patient is not sure.    Pertinent History  Pain began in 2007 and has gradually got worse and has progressed to nonstop. Patient states pain last all day long, everyday with no change in symptoms. Medication helps briefly. States she has difficulty with showering and standing. States she has to stand with her knee bent due to pulling sensation in legs. Pain begins immediately when stands and begins to walk. Pain feels better when she bends her knees when standing. Pain became constant and non stop around Dec 2018. Denies bowel or bladder changes, unexplained weakness, or unexpected weight loss/gain. She currently goes to pulmonary rehab 3x a week, but  has increased pain with treadmill walking. Will walk with cane on occasion around house especially at night due to occasional dizziness and vision impairments in medical history. States she also has difficulty with carrying groceries up steps due to low back pain. She would like to be able to walk better and have less pain to be less reliant on medication.     Limitations  Lifting;Standing;Walking;House hold activities    How long can you stand comfortably?  pain begins immediately     How long can you walk comfortably?  pain begins immediately     Patient Stated Goals  walk better, have less pain    Currently in Pain?  Yes    Pain Score  2     Pain Location  Back    Pain Orientation  Lower    Pain Descriptors / Indicators  Aching    Pain Type  Chronic pain    Pain Onset  More than a month ago    Pain Frequency  Intermittent           L/R single knee to chest stretch x 30s each LE; Hamstring stretch in supine with increasing range 60 seconds each LE Piriformis stretch R and L 30 second holds  Roller to lumbar and thoracic paraspinals x 3 minutes.   SAD in figure 4 position with belt BLE 5x 20 seconds  inferior direction Calf rollout with "the stick" 2 minutes each calf Hamstring rollout with " the stick" 2 minutes each calf    Ther-ex: cueing for sequencing, positioning and muscle activation  Hooklying HS stretch with DF/PF for sciatic nerve mobility x20 bilateral; Posterior pelvic tilts x15. tactile and verbal cue for appropriate technique 5 seconds with hold, improved alignment  posterior pelvic tilt with adduction squeeze in hooklying 15x 3 second holds  Posterior pelvic tilt with abduction GTB in hooklying 15x- L calf spasm  Swiss ball forward trunk flexion overreach 10x 10 seconds holds , 8x each diagonal direction with 10 second holds.    Treadmill 2.0 mph, 0% incline for 30 seconds, 1% incline 30 seconds, 2% incline 1 minute with 2.0 mph first 30 seconds, 1.8 mph second half  of minute. Cueing for abdominal activation and sequencing of muscle activation.     Pt educated throughout session about proper posture and technique with exercises. Improved exercise technique, movement at target joints, use of target muscles after min to mod verbal, visual, tactile cues.                      PT Education - 04/23/19 1450    Education Details  exercise technique, body mechanics    Person(s) Educated  Patient    Methods  Explanation;Demonstration;Tactile cues;Verbal cues    Comprehension  Verbalized understanding;Returned demonstration;Verbal cues required;Tactile cues required       PT Short Term Goals - 04/14/19 1615      PT SHORT TERM GOAL #1   Title  Patient will be independent with completion of HEP to improve ability to complete functional tasks and functional ADLs.    Baseline  10/23: will give HEP next session 11/19: HEP compliance    Time  2    Period  Weeks    Status  Achieved      PT SHORT TERM GOAL #2   Title  Patient will report pain score less than 4/10 on VAS to demonstrate reduction of pain levels with functional tasks.     Baseline  10/23: 6/10 11/19: 5/10 12/12: 9/10 1/10: 7/10  2/11: 3/10     Time  2    Period  Weeks    Status  Achieved        PT Long Term Goals - 04/14/19 1616      PT LONG TERM GOAL #1   Title  Patient will report pain score of <3/10 on VAS for decreased pain levels and ease with functional activities.    Baseline  10/23: 6/10 11/19: 5/10 12/12 9/10 1/10: 7/10 2/11: 3/10 3/9: 6/10  5/26: 8/10 03/17/19: 7/10 8/3: 5/10    Time  4    Period  Weeks    Status  Partially Met    Target Date  05/12/19      PT LONG TERM GOAL #2   Title  Patient will be able to ambulate on treadmill for 422mnutes without increase in back/leg pain demonstrating improved community ambulation and treadmill walking at pulmonary rehab    Baseline  10/23: 024mutes increases pain 11/19: 22m87mtes 12/12: has not had the chance to perform  1/10: was able to do 5 minutes without pain 2/11: able to do 5 minutes then had to bend over 3/9: 5 minutes 30 seconds at 2.0 mphs 5/26: had to walk 10 minutes at cancer center with extreme pain but was able to perform 7/6: walks 10 minutes with increase of pain by  2 points 8/3: able to perform with bent over posture    Time  4    Period  Weeks    Status  Achieved      PT LONG TERM GOAL #3   Title  Patient will have decreased modified oswestry score <50% for improved ability to complete household tasks with less pain levels.     Baseline  10/23: 62% 11/19: 42%      Time  4    Period  Weeks    Status  Achieved      PT LONG TERM GOAL #4   Title  Patient will score 4+/5 on BLE MMT to demonstrate improved glute strength and ability to carry groceries with increased ease up/down stairs at home.    Baseline  10/23: 4-/5 grossly 11/19: 4/5 grossly; hamstrings 4-/5 12/12: 4/5 gross 1/10: 4/5 gross 2/11: gross 4+/5 with hamstrings and abuductors 4-/5 3/9: gross 4+/5 hamstrings and abductors 4-/5 5/27: grossly 3+/5 with pain with LLE movements 7/6: grossly 4-/5 with pain with LLE movement 8/3: deferred due to swelling/pain    Time  4    Period  Weeks    Status  Deferred    Target Date  05/12/19      PT LONG TERM GOAL #5   Title  Patient will have decreased modified oswestry score <35% for improved ability to complete household tasks with less pain levels.     Baseline  11/19: 42% 12/12: 52% 1/10: 48%  2/11: 30%     Time  4    Period  Weeks    Status  Achieved      Additional Long Term Goals   Additional Long Term Goals  Yes      PT LONG TERM GOAL #6   Title  Patient will be able to make and/or strip the bed without pain and without needing rest breaks to perform iADLs independently.     Baseline  2/11: pain limits ability to perform, ~4 breaks required 3/9: takes her time, 3 breaks 5/27: very painful still 7/6: requires only one rest break 8/3: able to perform with +2 pain increase    Time  4     Period  Weeks    Status  Partially Met    Target Date  05/12/19      PT LONG TERM GOAL #7   Title  Patient will have decreased modified oswestry score <20% for improved ability to complete household tasks with less pain levels.     Baseline  2/11: 30%  3/9: 30% 5/27: 63% 7/6: 38% 8/3: 42%    Time  4    Period  Weeks    Status  On-going    Target Date  05/12/19      PT LONG TERM GOAL #8   Title  Patient will tolerate >10 minutes of upright postural positioning of trunk with active mobility with <2 degrees VAS increase.    Baseline  8/3: unable to tolerate 10  minutes    Time  4    Period  Weeks    Status  New    Target Date  05/12/19            Plan - 04/23/19 1505    Clinical Impression Statement  Patient presents with continued swelling of limbs however reports she is breathing better now that she is on a medication "vacation". Patient progressing with ambulation incline/challenge with increased tolerance of % raise. Patient will continue to benefit from skilled  physical therapy to improve strength, to reduce pain levels with ADLs, and to improve quality of life.    Rehab Potential  Fair    Clinical Impairments Affecting Rehab Potential  (+) motivation (-) chronicity of symptoms    PT Frequency  2x / week    PT Duration  4 weeks    PT Treatment/Interventions  ADLs/Self Care Home Management;Cryotherapy;Moist Heat;Traction;Iontophoresis 27m/ml Dexamethasone;Ultrasound;Gait training;Stair training;Functional mobility training;Neuromuscular re-education;Balance training;Therapeutic exercise;Therapeutic activities;Patient/family education;Manual techniques;Energy conservation;Aquatic Therapy;Electrical Stimulation;Passive range of motion;Dry needling;Taping    PT Next Visit Plan  progress strengthening and postural mechanics    PT Home Exercise Plan  No updates this day    Consulted and Agree with Plan of Care  Patient       Patient will benefit from skilled therapeutic  intervention in order to improve the following deficits and impairments:  Abnormal gait, Cardiopulmonary status limiting activity, Decreased activity tolerance, Decreased balance, Decreased endurance, Decreased range of motion, Difficulty walking, Decreased strength, Hypomobility, Decreased mobility, Impaired perceived functional ability, Impaired flexibility, Postural dysfunction, Obesity, Improper body mechanics, Pain  Visit Diagnosis: 1. Other abnormalities of gait and mobility   2. Chronic low back pain, unspecified back pain laterality, unspecified whether sciatica present   3. Abnormal posture   4. Muscle weakness (generalized)        Problem List Patient Active Problem List   Diagnosis Date Noted  . Postoperative wound infection 03/13/2019  . Seroma of breast 03/11/2019  . Age-related osteoporosis without current pathological fracture 02/27/2019  . Malignant neoplasm of central portion of right breast in female, estrogen receptor positive ([place] 02/12/2019  . Genetic testing 01/31/2019  . Malignant neoplasm of upper-inner quadrant of right breast in female, estrogen receptor positive ([place] 01/15/2019  . Long term current use of opiate analgesic 12/02/2018  . Tympanic membrane central perforation, left 11/04/2018  . DDD (degenerative disc disease), lumbosacral 10/10/2018  . Abnormal MRI, lumbar spine (08/29/2018) 10/10/2018  . 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 Lumbar nerve root impingement (Bilateral: L4 & L5) (L>R: L5) 10/10/2018  . Lumbar Spinal instability of L4-5 10/10/2018  . Lumbar facet hypertrophy 10/10/2018  . COPD (chronic obstructive pulmonary disease) with emphysema ([place] 08/28/2018  . Elevated rheumatoid factor 08/13/2018  . Elevated C-reactive protein (CRP) 08/12/2018  . Elevated sed rate 08/12/2018  . Lumbar facet syndrome (Bilateral) 08/12/2018  . Back pain with left-sided radiculopathy 08/12/2018  . DDD  (degenerative disc disease), lumbar 08/12/2018  . Chronic lower extremity pain (Primary Area of Pain) (Bilateral) (L>R) 07/23/2018  . Chronic low back pain (Secondary Area of Pain) (Bilateral) (L>R) w/ sciatica (Bilateral) 07/23/2018  . Chronic pain syndrome 07/23/2018  . Pharmacologic therapy 07/23/2018  . Disorder of skeletal system 07/23/2018  . Problems influencing health status 07/23/2018  . Chronic [facility]iliac joint [facility] (Bilateral) (L>R) 07/23/2018  . Status post lumboperitoneal shunt placement 02/26/2018  . 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 Tympanic membrane perforation 05/30/2016  . PMB (postmenopausal bleeding) 04/26/2016  . Rash 12/27/2015  . 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 Plantar fasciitis of foot (Left) 05/26/2015  . Spondylosis of lumbar region without myelopathy or radiculopathy 05/26/2015  . Lumbar foraminal stenosis (L3-4, L4-5, L5-S1) (L>R) 05/26/2015  . Spinal stenosis of lumbar region with neurogenic claudication 05/26/2015  . Encounter for screening mammogram for malignant neoplasm of breast 12/02/2014  . Goiter 12/02/2014  . Chronic idiopathic gout  of foot (Right) 07/06/2014  . Biceps tendonitis (Right) 05/19/2014  . Shoulder pain (Right) 05/19/2014  . Facial weakness 05/05/2014  . Post herpetic neuralgia 05/05/2014  . Chronic dysfunction of left eustachian tube 03/24/2014  . Osteopenia 03/13/2014  . Lumbar Grade 1 Anterolisthesis of L3/4 (74m) & L4/5 (4-189m(w/ Dynamic instability) 03/13/2014  . Family history of colon cancer 12/02/2013  . Dyshidrotic eczema 11/17/2013  . Diastasis recti 04/08/2013  . Facial nerve paresis 07/31/2012  . Blepharospasm 05/08/2012  . 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 Obesity 03/28/2012  . Hemoptysis 12/22/2011  . Neuropathic postherpetic trigeminal neuralgia  12/12/2011  . Allergic rhinitis, mild 10/12/2011  . Disequilibrium syndrome 09/27/2011  . Chronic bronchitis ([facility]09/24/2012  . Essential hypertension 05/24/2011  . Type 2 diabetes mellitus without complications ([id number]. Pulmonary nodules 05/05/2011    MaJanna ArchPT, DPT   04/23/2019, 3:16 PM  [place] [facility] [address]io [id number]: [phone] Fax:  [phone]: LiAbbygayle HelfandRN: [id number] of Birth: 6/Jul 11, 1951

## 2019-04-24 ENCOUNTER — Ambulatory Visit: Payer: Medicare Other | Admitting: Occupational Therapy

## 2019-04-24 ENCOUNTER — Other Ambulatory Visit: Payer: Self-pay

## 2019-04-24 DIAGNOSIS — M545 Low back pain: Secondary | ICD-10-CM | POA: Diagnosis not present

## 2019-04-24 DIAGNOSIS — I972 Postmastectomy lymphedema syndrome: Secondary | ICD-10-CM

## 2019-04-25 NOTE — Therapy (Signed)
[place] MAIN [facility] SERVICES [address] [place], [place], [address] Phone: [phone]   Fax:  [phone]  Occupational Therapy Treatment  Patient Details  Name: Susan Callahan MRN: [id number] Date of Birth: June 12, 1951 Referring Provider (OT): Humberto Leep, MD   Encounter Date: 04/24/2019  OT End of Session - 04/24/19 1038    Visit Number  17    Number of Visits  36    Date for OT Re-Evaluation  05/20/19    OT Start Time  0315    OT Stop Time  0425    OT Time Calculation (min)  70 min    Activity Tolerance  Patient tolerated treatment well;No increased pain    Behavior During Therapy  WFL for tasks assessed/performed       Past Medical History:  Diagnosis Date  . COPD (chronic obstructive pulmonary disease) ([place])   . Depression 1974  . Diabetes mellitus without complication ([place])   . Hypertension   . Migraine   . 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 LUMBAR PERITONEAL SHUNT    . TYMPANOSTOMY TUBE PLACEMENT      There were no vitals filed for this visit.  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 Pt reports hand sweling is unchanged despite custom hand wrap between visits. Pt reoports volar        mid forearm pain from cording is reduced since last session.    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 01/30/19 partial mastectomy. Margins clear. 0/2 LN  negative for  disease. actinic keritosis,DDD, DM, obesity, thyroid nodules, pulmonary nodules, chronic back pain, HTN, COPD, hx depression    Limitations  pain and swelling in R axilla and arm, chronic back pain, muscle weakness, impaired balance, decreased RUE AROM, abnormal gait, difficulty walking, impaired functional mobility and transfers,    Repetition   Increases Symptoms    Special Tests  -Stemmer at MPs bilaterally    Patient Stated Goals  relduce pain and improve movement  in my R arm so I can do things I need to do. reduce risk of lymphedema    Pain Onset  More than a month ago    Pain Onset  More than a month ago                   OT Treatments/Exercises (OP) - 04/25/19 0001      ADLs   ADL Education Given  Yes      Manual Therapy   Manual Therapy  Edema management;Myofascial release;Manual Lymphatic Drainage (MLD);Compression Bandaging    Manual Lymphatic Drainage (MLD)  MLD to RUE/ RUQ  utilizing ipsilateral axillo-inguinal watershed, and anterior and posterior axillary anastamoses. Pt tolerated well in supine and sidelying. No myofacial release today.    Compression Bandaging  Complete multilayer lymphedema arm wrap using gradient techniques with guaze finger wrap, 8 and 10 cm short stretch wraps over Rosidal foam from base of nails to axilla.             OT Education - 04/24/19 1035    Education Details  Pt edu for benefits and cons of full LE arm wrap. Discussed how wraps work to facilitate increased lymphatic pumping from   distal to proximal. Pt advised to  remove wraps and resume use of glove only if she experiences atypical SOB and/ or acute pain and/ or swelling in the LUQ or LUE    Person(s) Educated  Patient    Methods  Demonstration;Explanation;Tactile cues;Verbal cues;Handout    Comprehension  Verbalized understanding;Returned demonstration          OT Long Term Goals - 04/07/19 1741      OT LONG TERM GOAL #1   Title  Pt independent with lymphedema precautions and prevention strategies to limit LE progression.    Baseline  Max A    Time  4    Period  Days    Status  Achieved      OT LONG TERM GOAL #2   Title  Pt will be independent with lymphedema self-care home program and AWS ther ex to improve functional performance of basic and instrumental ADLs by DC.    Baseline  Max A    Time  12     Period  Weeks    Status  Achieved      OT LONG TERM GOAL #3   Title  Pt wil report 0/10 pain with RUE functional use ( reaching, lifting, carrying, dressing, dressing, etc) to achieve independent , pain free ADLs performance.    Baseline  Max A    Time  12    Period  Weeks    Status  New            Plan - 04/24/19 1039    Clinical Impression Statement  Pt comntinues to present with hand and FA swelling on affected L seife.. Swelling did not respond to custom hand wrap with cowrap applied last session. After MLD and MR for stubborn cording, applied full  multilayer lymphedema arm wrap , including   fingers and hand. Added supplemental, low profile foam pad to dorsal hand crossing wrist to arm to add slightly more pressure to back of hand to reduce swelling between [facility] bones. Pt endouraged to perform all ther ex   daily while in compression to promote lymphatic pumping. Cont as per POC.    OT Occupational Profile and History  Comprehensive Assessment- Review of records and extensive additional review of physical, cognitive, psychosocial history related to current functional performance    Occupational performance deficits (Please refer to evaluation for details):  ADL's;IADL's;Social Participation;Leisure;Rest and Sleep;Work    Marketing executive / Function / Physical Skills  ADL;Decreased knowledge of precautions;ROM;UE functional use;Decreased knowledge of use of DME;Edema;Skin integrity;Pain;IADL    Rehab Potential  Good    Clinical Decision Making  Several treatment options, min-mod task modification necessary    Comorbidities Affecting Occupational Performance:  Presence of comorbidities impacting occupational performance    Comorbidities impacting occupational performance description:  see SUBJUECTIVE    Modification or Assistance to Complete Evaluation   Min-Moderate modification of tasks or assist with assess necessary to complete eval    OT Frequency  2x / week    OT Duration  12  weeks    OT Treatment/Interventions  Self-care/ADL training;Therapeutic exercise;Manual lymph drainage;Patient/family education;Therapeutic activities;DME and/or AE instruction;Manual Therapy;Other (comment)   consider fitting with prophylactic cmopression arm sleeve   Plan  BUE AROM ther ex to reduce axillary cording- 2 sets of 10 2-3 x daily    Consulted and Agree with Plan of Care  Patient       Patient will benefit from skilled therapeutic intervention in order to improve the following deficits and impairments:   Body Structure /  Function / Physical Skills: ADL, Decreased knowledge of precautions, ROM, UE functional use, Decreased knowledge of use of DME, Edema, Skin integrity, Pain, IADL       Visit Diagnosis: 1. Postmastectomy lymphedema syndrome       Problem List Patient Active Problem List   Diagnosis Date Noted  . Postoperative wound infection 03/13/2019  . Seroma of breast 03/11/2019  . Age-related osteoporosis without current pathological fracture 02/27/2019  . Malignant neoplasm of central portion of right breast in female, estrogen receptor positive ([place]) 02/12/2019  . Genetic testing 01/31/2019  . Malignant neoplasm of upper-inner quadrant of right breast in female, estrogen receptor positive ([place]) 01/15/2019  . Long term current use of opiate analgesic 12/02/2018  . Tympanic membrane central perforation, left 11/04/2018  . DDD (degenerative disc disease), lumbosacral 10/10/2018  . Abnormal MRI, lumbar spine (08/29/2018) 10/10/2018  . 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 Lumbar nerve root impingement (Bilateral: L4 & L5) (L>R: L5) 10/10/2018  . Lumbar Spinal instability of L4-5 10/10/2018  . Lumbar facet hypertrophy 10/10/2018  . COPD (chronic obstructive pulmonary disease) with emphysema ([place]) 08/28/2018  . Elevated rheumatoid factor 08/13/2018  . Elevated C-reactive protein (CRP) 08/12/2018  . Elevated sed rate  08/12/2018  . Lumbar facet syndrome (Bilateral) 08/12/2018  . Back pain with left-sided radiculopathy 08/12/2018  . DDD (degenerative disc disease), lumbar 08/12/2018  . Chronic lower extremity pain (Primary Area of Pain) (Bilateral) (L>R) 07/23/2018  . Chronic low back pain (Secondary Area of Pain) (Bilateral) (L>R) w/ sciatica (Bilateral) 07/23/2018  . Chronic pain syndrome 07/23/2018  . Pharmacologic therapy 07/23/2018  . Disorder of skeletal system 07/23/2018  . Problems influencing health status 07/23/2018  . Chronic sacroiliac joint pain [facility] Area of Pain) (Bilateral) (L>R) 07/23/2018  . Status post lumboperitoneal shunt placement 02/26/2018  . 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 Tympanic membrane perforation 05/30/2016  . PMB (postmenopausal bleeding) 04/26/2016  . Rash 12/27/2015  . 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 Plantar fasciitis of foot (Left) 05/26/2015  . Spondylosis of lumbar region without myelopathy or radiculopathy 05/26/2015  . Lumbar foraminal stenosis (L3-4, L4-5, L5-S1) (L>R) 05/26/2015  . Spinal stenosis of lumbar region with neurogenic claudication 05/26/2015  . Encounter for screening mammogram for malignant neoplasm of breast 12/02/2014  . Goiter 12/02/2014  . Chronic idiopathic gout of foot (Right) 07/06/2014  . Biceps tendonitis (Right) 05/19/2014  . Shoulder pain (Right) 05/19/2014  . Facial weakness 05/05/2014  . Post herpetic neuralgia 05/05/2014  . Chronic dysfunction of left eustachian tube 03/24/2014  . Osteopenia 03/13/2014  . Lumbar Grade 1 Anterolisthesis of L3/4 (71mm) & L4/5 (4-65mm)(w/ Dynamic instability) 03/13/2014  . Family history of colon cancer 12/02/2013  . Dyshidrotic eczema 11/17/2013  . Diastasis recti 04/08/2013  . Facial nerve paresis 07/31/2012  . Blepharospasm 05/08/2012  . Conductive hearing loss of left ear with unrestricted hearing of right  ear 04/08/2012  . Obesity 03/28/2012  . Hemoptysis 12/22/2011  . Neuropathic postherpetic trigeminal neuralgia 12/12/2011  . Allergic rhinitis, mild 10/12/2011  . Disequilibrium syndrome 09/27/2011  . Chronic bronchitis ([place]) 06/05/2011  . Essential hypertension 05/24/2011  . Type 2 diabetes mellitus without complications ([place]) 16/06/9603  . Pulmonary nodules 05/05/2011    Andrey Spearman, MS, OTR/L, [facility] 04/25/19 10:43 AM   [place] MAIN [facility] SERVICES [address] [place], [place], [address] Phone: [phone]   Fax:  [phone]  Name: Susan Callahan MRN: [id number] Date of Birth: [birth year]

## 2019-04-28 ENCOUNTER — Encounter: Payer: Self-pay | Admitting: Pain Medicine

## 2019-04-28 NOTE — Progress Notes (Signed)
Pain Management Virtual Encounter Note - Virtual Visit via Telephone Telehealth (real-time audio visits between healthcare provider and patient).   Patient's Phone No. & Preferred Pharmacy:  [phone] (home); [phone] (mobile); (Preferred) [phone] [email or web address] [place] [address] [place] [address] Phone: [phone] Fax: [phone]    Pre-screening note:  Our staff contacted Ms. Frazzini and offered her an "in person", "face-to-face" appointment versus a telephone encounter. She indicated preferring the telephone encounter, at this time.   Reason for Virtual Visit: COVID-19*  Social distancing based on CDC and AMA recommendations.   I contacted Timoteo Ace on 04/29/2019 via telephone.      I clearly identified myself as Gaspar Cola, MD. I verified that I was speaking with the correct person using two identifiers (Name: Javeria Briski, and date of birth: April 06, 2003).  Advanced Informed Consent I sought verbal advanced consent from Timoteo Ace for virtual visit interactions. I informed Ms. Deen of possible security and privacy concerns, risks, and limitations associated with providing "not-in-person" medical evaluation and management services. I also informed Ms. Heidel of the availability of "in-person" appointments. Finally, I informed her that there would be a charge for the virtual visit and that she could be  personally, fully or partially, financially responsible for it. Ms. Hurd expressed understanding and agreed to proceed.   Historic Elements   Ms. Nadiah Corbit is a 68 y.o. year old, female patient evaluated today after her last encounter by our practice on 04/02/2019. Ms. Pelle  has a past medical history of COPD (chronic obstructive pulmonary disease) ([place]), Depression (1974), Diabetes mellitus without complication ([place]), Hypertension, Migraine,  and Ramsay Hunt auricular syndrome. She also  has a past surgical history that includes Tympanostomy tube placement and Lumbar peritoneal shunt. Ms. Fickling has a current medication list which includes the following prescription(s): allopurinol, amitriptyline, b complex vitamins, calcium carbonate-vitamin d, furosemide, gabapentin, letrozole, magnesium oxide, metformin, multivitamin, mupirocin ointment, tramadol, verapamil, and acetazolamide. She  reports that she quit smoking about 28 years ago. Her smoking use included cigarettes. She has a 81.00 pack-year smoking history. She has never used smokeless tobacco. She reports that she does not drink alcohol or use drugs. Ms. Medine is allergic to penicillins; sulfa antibiotics; and bee venom.   HPI  Today, she is being contacted for a post-procedure assessment.  The patient indicates having done great with the last treatment, which she considers to have been even better than the initial one.  At this point, she thinks that she does not need anything else and therefore we will not plan on doing anything until she gives [place] a call.  Post-Procedure Evaluation  Procedure: Diagnostic/therapeutic bilateral L4 TFESI #2 + left-sided L3-4 LESI #2 under fluoroscopic guidance, nosedation Pre-procedure pain level:  3/10 Post-procedure: 0/10 (100% relief)  Sedation: Please see nurses note.  Effectiveness during initial hour after procedure(Ultra-Short Term Relief): 100 %   Local anesthetic used: Long-acting (4-6 hours) Effectiveness: Defined as any analgesic benefit obtained secondary to the administration of local anesthetics. This carries significant diagnostic value as to the etiological location, or anatomical origin, of the pain. Duration of benefit is expected to coincide with the duration of the local anesthetic used.  Effectiveness during initial 4-6 hours after procedure(Short-Term Relief): 100 %   Long-term benefit: Defined as any relief past the  pharmacologic duration of the local anesthetics.  Effectiveness past the initial 6 hours after procedure(Long-Term Relief):  100 % (pain has recently started coming)   Current benefits: Defined as benefit that persist at this time.   Analgesia:  90-100% better Function: Ms. Machi reports improvement in function ROM: Ms. Trant reports improvement in ROM  Pharmacotherapy Assessment  Analgesic: Tramadol 50 mg, 1 tab PO q 6 hrs (200 mg/day of tramadol)(enough refills until 06/16/19) MME/day:20mg /day.   Monitoring: Pharmacotherapy: No side-effects or adverse reactions reported. [place] PMP: PDMP reviewed during this encounter.       Compliance: No problems identified. Effectiveness: Clinically acceptable. Plan: Refer to "POC".  UDS:  Summary  Date Value Ref Range Status  12/02/2018 FINAL  Final    Comment:    ==================================================================== TOXASSURE SELECT 13 (MW) ==================================================================== Test                             Result       Flag       Units Drug Present   Tramadol                       >8621                   ng/mg creat   O-Desmethyltramadol            7705                    ng/mg creat   N-Desmethyltramadol            1902                    ng/mg creat    Source of tramadol is a prescription medication.    O-desmethyltramadol and N-desmethyltramadol are expected    metabolites of tramadol. ==================================================================== Test                      Result    Flag   Units      Ref Range   Creatinine              58               mg/dL      >=20 ==================================================================== Declared Medications:  Medication list was not provided. ==================================================================== For clinical consultation, please call [phone]. ====================================================================    Laboratory Chemistry Profile (12 mo)  Renal: 07/23/2018: BUN 21; BUN/Creatinine Ratio 20; Creatinine, Ser 1.05  Lab Results  Component Value Date   GFRAA 64 07/23/2018   GFRNONAA 55 (L) 07/23/2018   Hepatic: 07/23/2018: Albumin 4.7 Lab Results  Component Value Date   AST 17 07/23/2018   Other: 07/23/2018: 25-Hydroxy, Vitamin D 36; 25-Hydroxy, Vitamin D-2 <1.0; 25-Hydroxy, Vitamin D-3 36; CRP 23; Sed Rate 43; Vitamin B-12 603 Note: Above Lab results reviewed.  Imaging  Last 90 days:  Dg Pain Clinic C-arm 1-60 Min No Report  Result Date: 04/01/2019 Fluoro was used, but no Radiologist interpretation will be provided. Please refer to "NOTES" tab for provider progress note.   Assessment  The primary encounter diagnosis was Chronic pain syndrome. 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  Plan of Care  I am having Sonia Side. Roddey maintain her allopurinol, amitriptyline, b complex vitamins, Calcium Carbonate-Vitamin D, furosemide, gabapentin, magnesium oxide, metFORMIN, multivitamin, verapamil, mupirocin ointment, letrozole, traMADol, and acetaZOLAMIDE.  Pharmacotherapy (Medications Ordered): No orders of the defined types were placed in this encounter.  Orders:  Orders Placed This Encounter  Procedures  . Lumbar Transforaminal Epidural    Standing Status:    Standing    Number of Occurrences:   1    Standing Expiration Date:   04/28/2020    Scheduling Instructions:     Purpose: Therapeutic     Indication: Lower extremity pain. Radiculopathy/Radiculitis lumbar (M54.16).     Side: Bilateral     Level: L4     Sedation: Patient's choice.     TIMEFRAME: PRN procedure. (Ms. Chumley will call when needed.)    Order Specific Question:   Where will this procedure be performed?    Answer:   [facility]  . Lumbar Epidural Injection    Standing Status:   Standing    Number of Occurrences:   9    Standing Expiration Date:   10/29/2020    Scheduling Instructions:     Purpose: Palliative     Indication: Lower extremity pain/Sciatica left (M54.32).     Side: Left-sided     Level: L3-4     Sedation: Patient's choice.     TIMEFRAME: PRN procedure. (Ms. Howley will call when needed.)    Order Specific Question:   Where will this procedure be performed?    Answer:   [facility]   Follow-up plan:   Return if symptoms worsen or fail to improve.      Interventional management options:  Considering:   CAUTION: Has a LUMBOPERITONEAL SPINAL SHUNT entering the canal at L2-3 on left and going up.  Diagnostic Caudal ESI #1  Diagnostic bilateral lumbar facet nerve block #1  Possible bilateral lumbar facet RFA #1    PRN Procedures:   Palliative bilateral L4 TFESI #3  Palliative left L3-4 LESI #3      Recent Visits Date Type Provider Dept  04/01/19 Procedure visit Milinda Pointer, MD [facility]  03/13/19 Procedure visit Milinda Pointer, MD [facility]  03/03/19 Office Visit Milinda Pointer, MD [facility]  Showing recent visits within past 90 days and meeting all other requirements   Today's Visits Date Type Provider Dept  04/29/19 Office Visit Milinda Pointer, MD [facility]  Showing today's visits and meeting all other requirements   Future Appointments Date Type Provider Dept   06/16/19 Appointment Milinda Pointer, MD [facility]  Showing future appointments within next 90 days and meeting all other requirements   I discussed the assessment and treatment plan with the patient. The patient was provided an opportunity to ask questions and all were answered. The patient agreed with the plan and demonstrated an understanding of the instructions.  Patient advised to call back or seek an in-person evaluation if the symptoms or condition worsens.  Total duration of non-face-to-face encounter: 15 minutes.  Note by: Gaspar Cola, MD Date: 04/29/2019; Time: 2:58 PM  Note: This dictation was prepared with Dragon dictation. Any transcriptional errors that may result from this process are unintentional.  Disclaimer:  * Given the special circumstances of the COVID-19 pandemic, the federal government  has announced that the Office for Civil Rights (OCR) will exercise its enforcement discretion and will not impose penalties on physicians using telehealth in the event of noncompliance with regulatory requirements under the [place] and [place] (HIPAA) in connection with the good faith provision of telehealth during the GITJL-59 national public health emergency. ([place])

## 2019-04-29 ENCOUNTER — Ambulatory Visit: Payer: Medicare Other

## 2019-04-29 ENCOUNTER — Ambulatory Visit: Payer: Medicare Other | Attending: Pain Medicine | Admitting: Pain Medicine

## 2019-04-29 ENCOUNTER — Ambulatory Visit: Payer: Medicare Other | Admitting: Occupational Therapy

## 2019-04-29 ENCOUNTER — Other Ambulatory Visit: Payer: Self-pay

## 2019-04-29 DIAGNOSIS — M6281 Muscle weakness (generalized): Secondary | ICD-10-CM

## 2019-04-29 DIAGNOSIS — M48061 Spinal stenosis, lumbar region without neurogenic claudication: Secondary | ICD-10-CM

## 2019-04-29 DIAGNOSIS — G8929 Other chronic pain: Secondary | ICD-10-CM

## 2019-04-29 DIAGNOSIS — M5442 Lumbago with sciatica, left side: Secondary | ICD-10-CM | POA: Diagnosis not present

## 2019-04-29 DIAGNOSIS — M5416 Radiculopathy, lumbar region: Secondary | ICD-10-CM

## 2019-04-29 DIAGNOSIS — M532X6 Spinal instabilities, lumbar region: Secondary | ICD-10-CM

## 2019-04-29 DIAGNOSIS — M4310 Spondylolisthesis, site unspecified: Secondary | ICD-10-CM

## 2019-04-29 DIAGNOSIS — R293 Abnormal posture: Secondary | ICD-10-CM

## 2019-04-29 DIAGNOSIS — M533 Sacrococcygeal disorders, not elsewhere classified: Secondary | ICD-10-CM

## 2019-04-29 DIAGNOSIS — G894 Chronic pain syndrome: Secondary | ICD-10-CM

## 2019-04-29 DIAGNOSIS — M545 Low back pain, unspecified: Secondary | ICD-10-CM

## 2019-04-29 DIAGNOSIS — I972 Postmastectomy lymphedema syndrome: Secondary | ICD-10-CM

## 2019-04-29 DIAGNOSIS — M79604 Pain in right leg: Secondary | ICD-10-CM | POA: Diagnosis not present

## 2019-04-29 DIAGNOSIS — R2689 Other abnormalities of gait and mobility: Secondary | ICD-10-CM

## 2019-04-29 DIAGNOSIS — M5441 Lumbago with sciatica, right side: Secondary | ICD-10-CM

## 2019-04-29 DIAGNOSIS — M79605 Pain in left leg: Secondary | ICD-10-CM

## 2019-04-29 DIAGNOSIS — M48062 Spinal stenosis, lumbar region with neurogenic claudication: Secondary | ICD-10-CM

## 2019-04-29 NOTE — Therapy (Signed)
[place] MAIN [facility] SERVICES [address] [place], [place], [address] Phone: [phone]   Fax:  [phone]  Occupational Therapy Treatment  Patient Details  Name: Susan Callahan MRN: [id number] Date of Birth: April 12, 1951 Referring Provider (OT): Humberto Leep, MD   Encounter Date: 04/29/2019  OT End of Session - 04/29/19 1714    Visit Number  18    Number of Visits  36    Date for OT Re-Evaluation  05/20/19    OT Start Time  0200    OT Stop Time  0300    OT Time Calculation (min)  60 min    Activity Tolerance  Patient tolerated treatment well;No increased pain    Behavior During Therapy  WFL for tasks assessed/performed       Past Medical History:  Diagnosis Date  . COPD (chronic obstructive pulmonary disease) ([place])   . Depression 1974  . Diabetes mellitus without complication ([place])   . Hypertension   . Migraine   . 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 LUMBAR PERITONEAL SHUNT    . TYMPANOSTOMY TUBE PLACEMENT      There were no vitals filed for this visit.  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 Pt reports she tolerated compression wraps , including hand wrqap, after last session for 24 hours, despite finger wraps losening.Pt brings cell phone photo showing slight decrease in swelling at dorsal hand when compression wraps were removed.    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 01/30/19 partial mastectomy. Margins clear. 0/2 LN  negative for  disease. actinic keritosis,DDD, DM, obesity, thyroid nodules, pulmonary nodules, chronic back pain, HTN, COPD, hx depression    Limitations  pain and swelling in R axilla and arm, chronic back pain, muscle weakness, impaired balance, decreased RUE AROM, abnormal gait,  difficulty walking, impaired functional mobility and transfers,    Repetition  Increases Symptoms    Special Tests  -Stemmer at MPs bilaterally    Patient Stated Goals  relduce pain and improve movement  in my R arm so I can do things I need to do. reduce risk of lymphedema    Pain Onset  More than a month ago    Pain Onset  More than a month ago                           OT Education - 04/29/19 1713    Education Details  Continued Pt edu for LE self care protocols throughout session whhile performing MLD    Person(s) Educated  Patient    Methods  Demonstration;Explanation;Tactile cues;Verbal cues;Handout    Comprehension  Verbalized understanding;Returned demonstration          OT Long Term Goals - 04/07/19 1741      OT LONG TERM GOAL #1   Title  Pt independent with lymphedema precautions and prevention strategies to limit LE progression.    Baseline  Max A    Time  4    Period  Days    Status  Achieved      OT LONG TERM GOAL #2   Title  Pt will be independent with lymphedema self-care home program and AWS ther ex to improve functional performance  of basic and instrumental ADLs by DC.    Baseline  Max A    Time  12    Period  Weeks    Status  Achieved      OT LONG TERM GOAL #3   Title  Pt wil report 0/10 pain with RUE functional use ( reaching, lifting, carrying, dressing, dressing, etc) to achieve independent , pain free ADLs performance.    Baseline  Max A    Time  12    Period  Weeks    Status  New            Plan - 04/29/19 1715    Clinical Impression Statement  Hand swelling slightly decreased at dorsal hand, but not significantly by visual assessment at beginning and end of session comprised of MLD and reapplication of compression. Pt tolerating manual therapy and compression without difficulty. Increased density of wraps to fingers and hands today. Consider wrapping over OTS glove next se4ssion. Emncouraged Pt to bring this to OT.    OT  Occupational Profile and History  Comprehensive Assessment- Review of records and extensive additional review of physical, cognitive, psychosocial history related to current functional performance    Occupational performance deficits (Please refer to evaluation for details):  ADL's;IADL's;Social Participation;Leisure;Rest and Sleep;Work    Marketing executive / Function / Physical Skills  ADL;Decreased knowledge of precautions;ROM;UE functional use;Decreased knowledge of use of DME;Edema;Skin integrity;Pain;IADL    Rehab Potential  Good    Clinical Decision Making  Several treatment options, min-mod task modification necessary    Comorbidities Affecting Occupational Performance:  Presence of comorbidities impacting occupational performance    Comorbidities impacting occupational performance description:  see SUBJUECTIVE    Modification or Assistance to Complete Evaluation   Min-Moderate modification of tasks or assist with assess necessary to complete eval    OT Frequency  2x / week    OT Duration  12 weeks    OT Treatment/Interventions  Self-care/ADL training;Therapeutic exercise;Manual lymph drainage;Patient/family education;Therapeutic activities;DME and/or AE instruction;Manual Therapy;Other (comment)   consider fitting with prophylactic cmopression arm sleeve   Plan  BUE AROM ther ex to reduce axillary cording- 2 sets of 10 2-3 x daily    Consulted and Agree with Plan of Care  Patient       Patient will benefit from skilled therapeutic intervention in order to improve the following deficits and impairments:   Body Structure / Function / Physical Skills: ADL, Decreased knowledge of precautions, ROM, UE functional use, Decreased knowledge of use of DME, Edema, Skin integrity, Pain, IADL       Visit Diagnosis: 1. Postmastectomy lymphedema syndrome       Problem List Patient Active Problem List   Diagnosis Date Noted  . Edema 04/16/2019  . Postoperative wound infection 03/13/2019  .  Seroma of breast 03/11/2019  . Age-related osteoporosis without current pathological fracture 02/27/2019  . Malignant neoplasm of central portion of right breast in female, estrogen receptor positive ([place]) 02/12/2019  . Genetic testing 01/31/2019  . Malignant neoplasm of upper-inner quadrant of right breast in female, estrogen receptor positive ([place]) 01/15/2019  . Long term current use of opiate analgesic 12/02/2018  . Tympanic membrane central perforation, left 11/04/2018  . DDD (degenerative disc disease), lumbosacral 10/10/2018  . Abnormal MRI, lumbar spine (08/29/2018) 10/10/2018  . 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 Lumbar nerve root impingement (Bilateral: L4 & L5) (L>R: L5) 10/10/2018  . Lumbar Spinal instability of L4-5 10/10/2018  .  Lumbar facet hypertrophy 10/10/2018  . COPD (chronic obstructive pulmonary disease) with emphysema ([place]) 08/28/2018  . Elevated rheumatoid factor 08/13/2018  . Elevated C-reactive protein (CRP) 08/12/2018  . Elevated sed rate 08/12/2018  . Lumbar facet syndrome (Bilateral) 08/12/2018  . Back pain with left-sided radiculopathy 08/12/2018  . DDD (degenerative disc disease), lumbar 08/12/2018  . Chronic lower extremity pain (Primary Area of Pain) (Bilateral) (L>R) 07/23/2018  . Chronic low back pain (Secondary Area of Pain) (Bilateral) (L>R) w/ sciatica (Bilateral) 07/23/2018  . Chronic pain syndrome 07/23/2018  . Pharmacologic therapy 07/23/2018  . Disorder of skeletal system 07/23/2018  . Problems influencing health status 07/23/2018  . Chronic sacroiliac joint pain [facility] Area of Pain) (Bilateral) (L>R) 07/23/2018  . Status post lumboperitoneal shunt placement 02/26/2018  . 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 Tympanic membrane perforation 05/30/2016  . PMB (postmenopausal bleeding) 04/26/2016  . Rash 12/27/2015  . Intervertebral disc stenosis of neural  canal of lumbar region 05/26/2015  . Plantar fasciitis of foot (Left) 05/26/2015  . Spondylosis of lumbar region without myelopathy or radiculopathy 05/26/2015  . Lumbar foraminal stenosis (L3-4, L4-5, L5-S1) (L>R) 05/26/2015  . Spinal stenosis of lumbar region with neurogenic claudication 05/26/2015  . Encounter for screening mammogram for malignant neoplasm of breast 12/02/2014  . Goiter 12/02/2014  . Chronic idiopathic gout of foot (Right) 07/06/2014  . Biceps tendonitis (Right) 05/19/2014  . Shoulder pain (Right) 05/19/2014  . Facial weakness 05/05/2014  . Post herpetic neuralgia 05/05/2014  . Chronic dysfunction of left eustachian tube 03/24/2014  . Osteopenia 03/13/2014  . Lumbar Grade 1 Anterolisthesis of L3/4 (76mm) & L4/5 (4-67mm)(w/ Dynamic instability) 03/13/2014  . Family history of colon cancer 12/02/2013  . Dyshidrotic eczema 11/17/2013  . Diastasis recti 04/08/2013  . Facial nerve paresis 07/31/2012  . Blepharospasm 05/08/2012  . 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 Obesity 03/28/2012  . Hemoptysis 12/22/2011  . Neuropathic postherpetic trigeminal neuralgia 12/12/2011  . Allergic rhinitis, mild 10/12/2011  . Disequilibrium syndrome 09/27/2011  . Chronic bronchitis ([place]) 06/05/2011  . Essential hypertension 05/24/2011  . Type 2 diabetes mellitus without complications ([place]) 32/95/1884  . Pulmonary nodules 05/05/2011    Andrey Spearman, MS, OTR/L, [facility] 04/29/19 5:18 PM  [place] MAIN [facility] SERVICES [address] [place], [place], [address] Phone: [phone]   Fax:  [phone]  Name: Kathalene Sporer MRN: [id number] Date of Birth: 1951-02-21

## 2019-04-29 NOTE — Therapy (Signed)
[place] MAIN [facility] SERVICES [address] [place], [place], [address] Phone: [phone]   Fax:  [phone]  Physical Therapy Treatment  Patient Details  Name: Susan Callahan MRN: [id number] Date of Birth: 18-Jul-1951 Referring Provider (PT): Francesco Sor   Encounter Date: 04/29/2019  PT End of Session - 04/29/19 1530    Visit Number  28    Number of Visits  63    Date for PT Re-Evaluation  05/12/19    Authorization Type  8/10 PN on 03/24/19    PT Start Time  1515    PT Stop Time  1555    PT Time Calculation (min)  40 min    Activity Tolerance  Patient tolerated treatment well    Behavior During Therapy  [facility] for tasks assessed/performed       Past Medical History:  Diagnosis Date  . COPD (chronic obstructive pulmonary disease) ([place])   . Depression 1974  . Diabetes mellitus without complication ([place])   . Hypertension   . Migraine   . 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 LUMBAR PERITONEAL SHUNT    . TYMPANOSTOMY TUBE PLACEMENT      There were no vitals filed for this visit.  Subjective Assessment - 04/29/19 1519    Subjective  Patient reports her R arm is swelling more and is going to get imaging to ensure theres no other vascular issues.    Pertinent History  Pain began in 2007 and has gradually got worse and has progressed to nonstop. Patient states pain last all day long, everyday with no change in symptoms. Medication helps briefly. States she has difficulty with showering and standing. States she has to stand with her knee bent due to pulling sensation in legs. Pain begins immediately when stands and begins to walk. Pain feels better when she bends her knees when standing. Pain became constant and non stop around Dec 2018. Denies bowel or bladder changes, unexplained weakness, or unexpected weight loss/gain. She currently goes to pulmonary rehab 3x a week, but has  increased pain with treadmill walking. Will walk with cane on occasion around house especially at night due to occasional dizziness and vision impairments in medical history. States she also has difficulty with carrying groceries up steps due to low back pain. She would like to be able to walk better and have less pain to be less reliant on medication.     Limitations  Lifting;Standing;Walking;House hold activities    How long can you stand comfortably?  pain begins immediately     How long can you walk comfortably?  pain begins immediately     Patient Stated Goals  walk better, have less pain    Currently in Pain?  Yes    Pain Score  3     Pain Location  Back    Pain Orientation  Lower    Pain Descriptors / Indicators  Aching    Pain Type  Chronic pain    Pain Onset  More than a month ago        manual  Hamstring stretch in supine with increasing range 60 seconds each LE Piriformis stretch R and L 30 second holds  Roller to lumbar and thoracic paraspinals x 3 minutes.   SAD in figure 4 position with belt BLE 5x 20 seconds inferior direction Calf rollout with "the stick" 2 minutes each calf    Ther-ex: cueing  for sequencing, positioning and muscle activation  Hooklying HS stretch with DF/PF for sciatic nerve mobility x20 bilateral; Posterior pelvic tilts x15. tactile and verbal cue for appropriate technique 5 seconds with hold, improved alignment  posterior pelvic tilt with adduction squeeze in hooklying 15x 3 second holds  Posterior pelvic tilt with abduction GTB in hooklying 15x- L calf spasm  Swiss ball forward trunk flexion overreach 10x 10 seconds holds , 8x each diagonal direction with 10 second holds.   cross arms upright posture; marches with abdominal activation. 10x each LE  Treadmill 2.0 mph, alternating between 1-2% incline for interval training 7 minutes. Cueing for abdominal activation and sequencing of muscle activation.     Pt educated throughout session about proper  posture and technique with exercises. Improved exercise technique, movement at target joints, use of target muscles after min to mod verbal, visual, tactile cues.                     PT Education - 04/29/19 1530    Education Details  exercise technique, body mechanics    Person(s) Educated  Patient    Methods  Explanation;Demonstration;Tactile cues;Verbal cues    Comprehension  Verbalized understanding;Returned demonstration;Verbal cues required;Tactile cues required       PT Short Term Goals - 04/14/19 1615      PT SHORT TERM GOAL #1   Title  Patient will be independent with completion of HEP to improve ability to complete functional tasks and functional ADLs.    Baseline  10/23: will give HEP next session 11/19: HEP compliance    Time  2    Period  Weeks    Status  Achieved      PT SHORT TERM GOAL #2   Title  Patient will report pain score less than 4/10 on VAS to demonstrate reduction of pain levels with functional tasks.     Baseline  10/23: 6/10 11/19: 5/10 12/12: 9/10 1/10: 7/10  2/11: 3/10     Time  2    Period  Weeks    Status  Achieved        PT Long Term Goals - 04/14/19 1616      PT LONG TERM GOAL #1   Title  Patient will report pain score of <3/10 on VAS for decreased pain levels and ease with functional activities.    Baseline  10/23: 6/10 11/19: 5/10 12/12 9/10 1/10: 7/10 2/11: 3/10 3/9: 6/10  5/26: 8/10 03/17/19: 7/10 8/3: 5/10    Time  4    Period  Weeks    Status  Partially Met    Target Date  05/12/19      PT LONG TERM GOAL #2   Title  Patient will be able to ambulate on treadmill for 20mnutes without increase in back/leg pain demonstrating improved community ambulation and treadmill walking at pulmonary rehab    Baseline  10/23: 070mutes increases pain 11/19: 43m143mtes 12/12: has not had the chance to perform 1/10: was able to do 5 minutes without pain 2/11: able to do 5 minutes then had to bend over 3/9: 5 minutes 30 seconds at 2.0 mphs  5/26: had to walk 10 minutes at cancer center with extreme pain but was able to perform 7/6: walks 10 minutes with increase of pain by 2 points 8/3: able to perform with bent over posture    Time  4    Period  Weeks    Status  Achieved  PT LONG TERM GOAL #3   Title  Patient will have decreased modified oswestry score <50% for improved ability to complete household tasks with less pain levels.     Baseline  10/23: 62% 11/19: 42%      Time  4    Period  Weeks    Status  Achieved      PT LONG TERM GOAL #4   Title  Patient will score 4+/5 on BLE MMT to demonstrate improved glute strength and ability to carry groceries with increased ease up/down stairs at home.    Baseline  10/23: 4-/5 grossly 11/19: 4/5 grossly; hamstrings 4-/5 12/12: 4/5 gross 1/10: 4/5 gross 2/11: gross 4+/5 with hamstrings and abuductors 4-/5 3/9: gross 4+/5 hamstrings and abductors 4-/5 5/27: grossly 3+/5 with pain with LLE movements 7/6: grossly 4-/5 with pain with LLE movement 8/3: deferred due to swelling/pain    Time  4    Period  Weeks    Status  Deferred    Target Date  05/12/19      PT LONG TERM GOAL #5   Title  Patient will have decreased modified oswestry score <35% for improved ability to complete household tasks with less pain levels.     Baseline  11/19: 42% 12/12: 52% 1/10: 48%  2/11: 30%     Time  4    Period  Weeks    Status  Achieved      Additional Long Term Goals   Additional Long Term Goals  Yes      PT LONG TERM GOAL #6   Title  Patient will be able to make and/or strip the bed without pain and without needing rest breaks to perform iADLs independently.     Baseline  2/11: pain limits ability to perform, ~4 breaks required 3/9: takes her time, 3 breaks 5/27: very painful still 7/6: requires only one rest break 8/3: able to perform with +2 pain increase    Time  4    Period  Weeks    Status  Partially Met    Target Date  05/12/19      PT LONG TERM GOAL #7   Title  Patient will have  decreased modified oswestry score <20% for improved ability to complete household tasks with less pain levels.     Baseline  2/11: 30%  3/9: 30% 5/27: 63% 7/6: 38% 8/3: 42%    Time  4    Period  Weeks    Status  On-going    Target Date  05/12/19      PT LONG TERM GOAL #8   Title  Patient will tolerate >10 minutes of upright postural positioning of trunk with active mobility with <2 degrees VAS increase.    Baseline  8/3: unable to tolerate 10  minutes    Time  4    Period  Weeks    Status  New    Target Date  05/12/19            Plan - 04/29/19 1538    Clinical Impression Statement  Patient progressing with functional mobility and interventions with decreased back pain and improved body mechanics. Patient progressing with ambulation however gait mechanics slightly altered due to wrapping of R arm from lymphedema treatment. Patient will continue to benefit from skilled physical therapy to improve strength, to reduce pain levels with ADLs, and to improve quality of life.    Rehab Potential  Fair    Clinical Impairments Affecting Rehab Potential  (+)  motivation (-) chronicity of symptoms    PT Frequency  2x / week    PT Duration  4 weeks    PT Treatment/Interventions  ADLs/Self Care Home Management;Cryotherapy;Moist Heat;Traction;Iontophoresis 74m/ml Dexamethasone;Ultrasound;Gait training;Stair training;Functional mobility training;Neuromuscular re-education;Balance training;Therapeutic exercise;Therapeutic activities;Patient/family education;Manual techniques;Energy conservation;Aquatic Therapy;Electrical Stimulation;Passive range of motion;Dry needling;Taping    PT Next Visit Plan  progress strengthening and postural mechanics    PT Home Exercise Plan  No updates this day    Consulted and Agree with Plan of Care  Patient       Patient will benefit from skilled therapeutic intervention in order to improve the following deficits and impairments:  Abnormal gait, Cardiopulmonary status  limiting activity, Decreased activity tolerance, Decreased balance, Decreased endurance, Decreased range of motion, Difficulty walking, Decreased strength, Hypomobility, Decreased mobility, Impaired perceived functional ability, Impaired flexibility, Postural dysfunction, Obesity, Improper body mechanics, Pain  Visit Diagnosis: 1. Other abnormalities of gait and mobility   2. Chronic low back pain, unspecified back pain laterality, unspecified whether sciatica present   3. Abnormal posture   4. Muscle weakness (generalized)        Problem List Patient Active Problem List   Diagnosis Date Noted  . Edema 04/16/2019  . Postoperative wound infection 03/13/2019  . Seroma of breast 03/11/2019  . Age-related osteoporosis without current pathological fracture 02/27/2019  . Malignant neoplasm of central portion of right breast in female, estrogen receptor positive ([place] 02/12/2019  . Genetic testing 01/31/2019  . Malignant neoplasm of upper-inner quadrant of right breast in female, estrogen receptor positive ([place] 01/15/2019  . Long term current use of opiate analgesic 12/02/2018  . Tympanic membrane central perforation, left 11/04/2018  . DDD (degenerative disc disease), lumbosacral 10/10/2018  . Abnormal MRI, lumbar spine (08/29/2018) 10/10/2018  . 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 Lumbar nerve root impingement (Bilateral: L4 & L5) (L>R: L5) 10/10/2018  . Lumbar Spinal instability of L4-5 10/10/2018  . Lumbar facet hypertrophy 10/10/2018  . COPD (chronic obstructive pulmonary disease) with emphysema ([place] 08/28/2018  . Elevated rheumatoid factor 08/13/2018  . Elevated C-reactive protein (CRP) 08/12/2018  . Elevated sed rate 08/12/2018  . Lumbar facet syndrome (Bilateral) 08/12/2018  . Back pain with left-sided radiculopathy 08/12/2018  . DDD (degenerative disc disease), lumbar 08/12/2018  . Chronic lower extremity pain (Primary Area of  Pain) (Bilateral) (L>R) 07/23/2018  . Chronic low back pain (Secondary Area of Pain) (Bilateral) (L>R) w/ sciatica (Bilateral) 07/23/2018  . Chronic pain syndrome 07/23/2018  . Pharmacologic therapy 07/23/2018  . Disorder of skeletal system 07/23/2018  . Problems influencing health status 07/23/2018  . Chronic sacroiliac joint pain ([facility] (Bilateral) (L>R) 07/23/2018  . Status post lumboperitoneal shunt placement 02/26/2018  . 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 Tympanic membrane perforation 05/30/2016  . PMB (postmenopausal bleeding) 04/26/2016  . Rash 12/27/2015  . 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 Plantar fasciitis of foot (Left) 05/26/2015  . Spondylosis of lumbar region without myelopathy or radiculopathy 05/26/2015  . Lumbar foraminal stenosis (L3-4, L4-5, L5-S1) (L>R) 05/26/2015  . Spinal stenosis of lumbar region with neurogenic claudication 05/26/2015  . Encounter for screening mammogram for malignant neoplasm of breast 12/02/2014  . Goiter 12/02/2014  . Chronic idiopathic gout of foot (Right) 07/06/2014  . Biceps tendonitis (Right) 05/19/2014  . Shoulder pain (Right) 05/19/2014  . Facial weakness 05/05/2014  . Post herpetic neuralgia 05/05/2014  . Chronic  dysfunction of left eustachian tube 03/24/2014  . Osteopenia 03/13/2014  . Lumbar Grade 1 Anterolisthesis of L3/4 (29m) & L4/5 (4-151m(w/ Dynamic instability) 03/13/2014  . Family history of colon cancer 12/02/2013  . Dyshidrotic eczema 11/17/2013  . Diastasis recti 04/08/2013  . Facial nerve paresis 07/31/2012  . Blepharospasm 05/08/2012  . 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 Obesity 03/28/2012  . Hemoptysis 12/22/2011  . Neuropathic postherpetic trigeminal neuralgia 12/12/2011  . Allergic rhinitis, mild 10/12/2011  . Disequilibrium syndrome 09/27/2011  . Chronic  bronchitis ([place]09/24/2012  . Essential hypertension 05/24/2011  . Type 2 diabetes mellitus without complications ([id number]. Pulmonary nodules 05/05/2011   MaJanna ArchPT, DPT   04/29/2019, 4:01 PM  [place] [facility] [address] [id number]: [phone] Fax:  [phone]: LiPatrisha HausmannRN: [id number] of Birth: [birth year]

## 2019-05-01 ENCOUNTER — Ambulatory Visit: Payer: Medicare Other

## 2019-05-01 ENCOUNTER — Ambulatory Visit: Payer: Medicare Other | Admitting: Occupational Therapy

## 2019-05-01 ENCOUNTER — Other Ambulatory Visit: Payer: Self-pay

## 2019-05-01 DIAGNOSIS — R293 Abnormal posture: Secondary | ICD-10-CM

## 2019-05-01 DIAGNOSIS — I972 Postmastectomy lymphedema syndrome: Secondary | ICD-10-CM

## 2019-05-01 DIAGNOSIS — G8929 Other chronic pain: Secondary | ICD-10-CM

## 2019-05-01 DIAGNOSIS — M545 Low back pain, unspecified: Secondary | ICD-10-CM

## 2019-05-01 DIAGNOSIS — R2689 Other abnormalities of gait and mobility: Secondary | ICD-10-CM

## 2019-05-01 DIAGNOSIS — M6281 Muscle weakness (generalized): Secondary | ICD-10-CM

## 2019-05-01 NOTE — Therapy (Signed)
[place] MAIN [facility] SERVICES [address] [place], [place], [address] Phone: [phone]   Fax:  [phone]  Physical Therapy Treatment  Patient Details  Name: Susan Callahan MRN: [id number] Date of Birth: 12/06/1950 Referring Provider (PT): Francesco Sor   Encounter Date: 05/01/2019  PT End of Session - 05/01/19 1053    Visit Number  25    Number of Visits  25    Date for PT Re-Evaluation  05/12/19    Authorization Type  9/10 PN on 03/24/19    PT Start Time  1516    PT Stop Time  1559    PT Time Calculation (min)  43 min    Activity Tolerance  Patient tolerated treatment well    Behavior During Therapy  [facility] for tasks assessed/performed       Past Medical History:  Diagnosis Date  . COPD (chronic obstructive pulmonary disease) ([place])   . Depression 1974  . Diabetes mellitus without complication ([place])   . Hypertension   . Migraine   . 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 LUMBAR PERITONEAL SHUNT    . TYMPANOSTOMY TUBE PLACEMENT      There were no vitals filed for this visit.  Subjective Assessment - 05/01/19 1050    Subjective  Patient reports she is feeling a little more out of breathe today. Has been compliant with HEP, walked a lot at the dermatologist yesterday and going to get an ultrasound this afternoon.    Pertinent History  Pain began in 2007 and has gradually got worse and has progressed to nonstop. Patient states pain last all day long, everyday with no change in symptoms. Medication helps briefly. States she has difficulty with showering and standing. States she has to stand with her knee bent due to pulling sensation in legs. Pain begins immediately when stands and begins to walk. Pain feels better when she bends her knees when standing. Pain became constant and non stop around Dec 2018. Denies bowel or bladder changes, unexplained weakness, or unexpected  weight loss/gain. She currently goes to pulmonary rehab 3x a week, but has increased pain with treadmill walking. Will walk with cane on occasion around house especially at night due to occasional dizziness and vision impairments in medical history. States she also has difficulty with carrying groceries up steps due to low back pain. She would like to be able to walk better and have less pain to be less reliant on medication.     Limitations  Lifting;Standing;Walking;House hold activities    How long can you stand comfortably?  pain begins immediately     How long can you walk comfortably?  pain begins immediately     Patient Stated Goals  walk better, have less pain    Currently in Pain?  Yes    Pain Score  2     Pain Location  Back    Pain Orientation  Lower    Pain Descriptors / Indicators  Aching    Pain Type  Chronic pain    Pain Onset  More than a month ago    Pain Frequency  Intermittent          manual  Hamstring stretch in supine with increasing range 60 seconds each LE Piriformis stretch R and L 30 second holds  Roller to lumbar and thoracic paraspinals x 3 minutes.   SAD in figure 4 position with belt  BLE 5x 20 seconds inferior direction Calf rollout with "the stick" 2 minutes each calf     Ther-ex: cueing for sequencing, positioning and muscle activation  Hooklying HS stretch with DF/PF for sciatic nerve mobility x20 bilateral; Posterior pelvic tilts x15. tactile and verbal cue for appropriate technique 5 seconds with hold, improved alignment  posterior pelvic tilt with adduction squeeze in hooklying 15x 3 second holds  Posterior pelvic tilt with abduction GTB in hooklying 15x- L calf spasm  TrA activation GTB marches 10x each LE   cross arms upright posture; marches with abdominal activation. 10x each LE  modified seated windmill 8x each diagonal  Treadmill 2.0 mph, alternating between 1-2% incline for interval training 5 minutes. Cueing for abdominal activation and  sequencing of muscle activation. Terminated due to being out of breathe.     Pt educated throughout session about proper posture and technique with exercises. Improved exercise technique, movement at target joints, use of target muscles after min to mod verbal, visual, tactile cues.                        PT Education - 05/01/19 1053    Education Details  exercise technique, body mechanics    Person(s) Educated  Patient    Methods  Explanation;Demonstration;Tactile cues;Verbal cues    Comprehension  Verbalized understanding;Returned demonstration;Verbal cues required;Tactile cues required       PT Short Term Goals - 04/14/19 1615      PT SHORT TERM GOAL #1   Title  Patient will be independent with completion of HEP to improve ability to complete functional tasks and functional ADLs.    Baseline  10/23: will give HEP next session 11/19: HEP compliance    Time  2    Period  Weeks    Status  Achieved      PT SHORT TERM GOAL #2   Title  Patient will report pain score less than 4/10 on VAS to demonstrate reduction of pain levels with functional tasks.     Baseline  10/23: 6/10 11/19: 5/10 12/12: 9/10 1/10: 7/10  2/11: 3/10     Time  2    Period  Weeks    Status  Achieved        PT Long Term Goals - 04/14/19 1616      PT LONG TERM GOAL #1   Title  Patient will report pain score of <3/10 on VAS for decreased pain levels and ease with functional activities.    Baseline  10/23: 6/10 11/19: 5/10 12/12 9/10 1/10: 7/10 2/11: 3/10 3/9: 6/10  5/26: 8/10 03/17/19: 7/10 8/3: 5/10    Time  4    Period  Weeks    Status  Partially Met    Target Date  05/12/19      PT LONG TERM GOAL #2   Title  Patient will be able to ambulate on treadmill for 57mnutes without increase in back/leg pain demonstrating improved community ambulation and treadmill walking at pulmonary rehab    Baseline  10/23: 052mutes increases pain 11/19: 80m36mtes 12/12: has not had the chance to perform 1/10:  was able to do 5 minutes without pain 2/11: able to do 5 minutes then had to bend over 3/9: 5 minutes 30 seconds at 2.0 mphs 5/26: had to walk 10 minutes at cancer center with extreme pain but was able to perform 7/6: walks 10 minutes with increase of pain by 2 points 8/3: able to perform  with bent over posture    Time  4    Period  Weeks    Status  Achieved      PT LONG TERM GOAL #3   Title  Patient will have decreased modified oswestry score <50% for improved ability to complete household tasks with less pain levels.     Baseline  10/23: 62% 11/19: 42%      Time  4    Period  Weeks    Status  Achieved      PT LONG TERM GOAL #4   Title  Patient will score 4+/5 on BLE MMT to demonstrate improved glute strength and ability to carry groceries with increased ease up/down stairs at home.    Baseline  10/23: 4-/5 grossly 11/19: 4/5 grossly; hamstrings 4-/5 12/12: 4/5 gross 1/10: 4/5 gross 2/11: gross 4+/5 with hamstrings and abuductors 4-/5 3/9: gross 4+/5 hamstrings and abductors 4-/5 5/27: grossly 3+/5 with pain with LLE movements 7/6: grossly 4-/5 with pain with LLE movement 8/3: deferred due to swelling/pain    Time  4    Period  Weeks    Status  Deferred    Target Date  05/12/19      PT LONG TERM GOAL #5   Title  Patient will have decreased modified oswestry score <35% for improved ability to complete household tasks with less pain levels.     Baseline  11/19: 42% 12/12: 52% 1/10: 48%  2/11: 30%     Time  4    Period  Weeks    Status  Achieved      Additional Long Term Goals   Additional Long Term Goals  Yes      PT LONG TERM GOAL #6   Title  Patient will be able to make and/or strip the bed without pain and without needing rest breaks to perform iADLs independently.     Baseline  2/11: pain limits ability to perform, ~4 breaks required 3/9: takes her time, 3 breaks 5/27: very painful still 7/6: requires only one rest break 8/3: able to perform with +2 pain increase    Time  4     Period  Weeks    Status  Partially Met    Target Date  05/12/19      PT LONG TERM GOAL #7   Title  Patient will have decreased modified oswestry score <20% for improved ability to complete household tasks with less pain levels.     Baseline  2/11: 30%  3/9: 30% 5/27: 63% 7/6: 38% 8/3: 42%    Time  4    Period  Weeks    Status  On-going    Target Date  05/12/19      PT LONG TERM GOAL #8   Title  Patient will tolerate >10 minutes of upright postural positioning of trunk with active mobility with <2 degrees VAS increase.    Baseline  8/3: unable to tolerate 10  minutes    Time  4    Period  Weeks    Status  New    Target Date  05/12/19            Plan - 05/01/19 1058    Clinical Impression Statement  Patient is challenged with ambulation this session, feeling more out of breath this morning.  Is having reduction of pain at this time with improved muscle tissue lengthening and abdominal activation. Patient will continue to benefit from skilled physical therapy to improve strength, to reduce  pain levels with ADLs, and to improve quality of life.    Rehab Potential  Fair    Clinical Impairments Affecting Rehab Potential  (+) motivation (-) chronicity of symptoms    PT Frequency  2x / week    PT Duration  4 weeks    PT Treatment/Interventions  ADLs/Self Care Home Management;Cryotherapy;Moist Heat;Traction;Iontophoresis 50m/ml Dexamethasone;Ultrasound;Gait training;Stair training;Functional mobility training;Neuromuscular re-education;Balance training;Therapeutic exercise;Therapeutic activities;Patient/family education;Manual techniques;Energy conservation;Aquatic Therapy;Electrical Stimulation;Passive range of motion;Dry needling;Taping    PT Next Visit Plan  progress strengthening and postural mechanics    PT Home Exercise Plan  No updates this day    Consulted and Agree with Plan of Care  Patient       Patient will benefit from skilled therapeutic intervention in order to improve  the following deficits and impairments:  Abnormal gait, Cardiopulmonary status limiting activity, Decreased activity tolerance, Decreased balance, Decreased endurance, Decreased range of motion, Difficulty walking, Decreased strength, Hypomobility, Decreased mobility, Impaired perceived functional ability, Impaired flexibility, Postural dysfunction, Obesity, Improper body mechanics, Pain  Visit Diagnosis: Other abnormalities of gait and mobility  Chronic low back pain, unspecified back pain laterality, unspecified whether sciatica present  Abnormal posture  Muscle weakness (generalized)     Problem List Patient Active Problem List   Diagnosis Date Noted  . Edema 04/16/2019  . Postoperative wound infection 03/13/2019  . Seroma of breast 03/11/2019  . Age-related osteoporosis without current pathological fracture 02/27/2019  . Malignant neoplasm of central portion of right breast in female, estrogen receptor positive ([place] 02/12/2019  . Genetic testing 01/31/2019  . Malignant neoplasm of upper-inner quadrant of right breast in female, estrogen receptor positive ([place] 01/15/2019  . Long term current use of opiate analgesic 12/02/2018  . Tympanic membrane central perforation, left 11/04/2018  . DDD (degenerative disc disease), lumbosacral 10/10/2018  . Abnormal MRI, lumbar spine (08/29/2018) 10/10/2018  . 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 Lumbar nerve root impingement (Bilateral: L4 & L5) (L>R: L5) 10/10/2018  . Lumbar Spinal instability of L4-5 10/10/2018  . Lumbar facet hypertrophy 10/10/2018  . COPD (chronic obstructive pulmonary disease) with emphysema ([place] 08/28/2018  . Elevated rheumatoid factor 08/13/2018  . Elevated C-reactive protein (CRP) 08/12/2018  . Elevated sed rate 08/12/2018  . Lumbar facet syndrome (Bilateral) 08/12/2018  . Back pain with left-sided radiculopathy 08/12/2018  . DDD (degenerative disc disease),  lumbar 08/12/2018  . Chronic lower extremity pain (Primary Area of Pain) (Bilateral) (L>R) 07/23/2018  . Chronic low back pain (Secondary Area of Pain) (Bilateral) (L>R) w/ sciatica (Bilateral) 07/23/2018  . Chronic pain syndrome 07/23/2018  . Pharmacologic therapy 07/23/2018  . Disorder of skeletal system 07/23/2018  . Problems influencing health status 07/23/2018  . Chronic [facility]roiliac joint pain ([facility] (Bilateral) (L>R) 07/23/2018  . Status post lumboperitoneal shunt placement 02/26/2018  . 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 Tympanic membrane perforation 05/30/2016  . PMB (postmenopausal bleeding) 04/26/2016  . Rash 12/27/2015  . 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 Plantar fasciitis of foot (Left) 05/26/2015  . Spondylosis of lumbar region without myelopathy or radiculopathy 05/26/2015  . Lumbar foraminal stenosis (L3-4, L4-5, L5-S1) (L>R) 05/26/2015  . Spinal stenosis of lumbar region with neurogenic claudication 05/26/2015  . Encounter for screening mammogram for malignant neoplasm of breast 12/02/2014  . Goiter 12/02/2014  . Chronic idiopathic gout of foot (Right) 07/06/2014  . Biceps tendonitis (Right) 05/19/2014  . Shoulder  pain (Right) 05/19/2014  . Facial weakness 05/05/2014  . Post herpetic neuralgia 05/05/2014  . Chronic dysfunction of left eustachian tube 03/24/2014  . Osteopenia 03/13/2014  . Lumbar Grade 1 Anterolisthesis of L3/4 (67m) & L4/5 (4-165m(w/ Dynamic instability) 03/13/2014  . Family history of colon cancer 12/02/2013  . Dyshidrotic eczema 11/17/2013  . Diastasis recti 04/08/2013  . Facial nerve paresis 07/31/2012  . Blepharospasm 05/08/2012  . 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 Obesity 03/28/2012  . Hemoptysis 12/22/2011  . Neuropathic postherpetic trigeminal neuralgia 12/12/2011  . Allergic rhinitis,  mild 10/12/2011  . Disequilibrium syndrome 09/27/2011  . Chronic bronchitis ([place]09/24/2012  . Essential hypertension 05/24/2011  . Type 2 diabetes mellitus without complications ([id number]. Pulmonary nodules 05/05/2011   MaJanna ArchPT, DPT     05/01/2019, 11:09 AM  [place] [facility] [address]hone: [phone] Fax:  [phone]: LiLorrane MccayRN: [id number] of Birth: 6/02-Aug-1951

## 2019-05-01 NOTE — Therapy (Signed)
[place] MAIN [facility] SERVICES [address] [place], [place], [address] Phone: [phone]   Fax:  [phone]  Occupational Therapy Treatment  Patient Details  Name: Susan Callahan MRN: [id number] Date of Birth: 23-Oct-1950 Referring Provider (OT): Humberto Leep, MD   Encounter Date: 05/01/2019  OT End of Session - 05/01/19 1211    Visit Number  19    Number of Visits  36    Date for OT Re-Evaluation  05/20/19    OT Start Time  1100    OT Stop Time  1200    OT Time Calculation (min)  60 min    Activity Tolerance  Patient tolerated treatment well;No increased pain    Behavior During Therapy  WFL for tasks assessed/performed       Past Medical History:  Diagnosis Date  . COPD (chronic obstructive pulmonary disease) ([place])   . Depression 1974  . Diabetes mellitus without complication ([place])   . Hypertension   . Migraine   . 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 LUMBAR PERITONEAL SHUNT    . TYMPANOSTOMY TUBE PLACEMENT      There were no vitals filed for this visit.  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 Pt reports hand swelling was better after last wrap was removed. Pt reports she has ultrasiound this afternoon to rule out DVT in RUE/RUQ.    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 01/30/19 partial mastectomy. Margins clear. 0/2 LN  negative for  disease. actinic keritosis,DDD, DM, obesity, thyroid nodules, pulmonary nodules, chronic back pain, HTN, COPD, hx depression    Limitations  pain and swelling in R axilla and arm, chronic back pain, muscle weakness, impaired balance, decreased RUE AROM, abnormal gait, difficulty walking, impaired functional mobility and transfers,    Repetition  Increases Symptoms    Special Tests  -[place] at MPs bilaterally    Patient Stated Goals  relduce pain and improve movement  in my R arm so I can do things I need to do. reduce risk of lymphedema    Pain Onset  More than a month ago    Pain Onset  More than a month ago                   OT Treatments/Exercises (OP) - 05/01/19 0001      ADLs   ADL Education Given  Yes      Manual Therapy   Manual Therapy  Edema management;Manual Lymphatic Drainage (MLD)    Manual Lymphatic Drainage (MLD)  MLD to RUE/ RUQ  utilizing ipsilateral axillo-inguinal watershed, and anterior and posterior axillary anastamoses. Pt tolerated well in supine and sidelying. No myofacial release today.    Compression Bandaging  no compression wrap in prep for iltasound later today             OT Education - 05/01/19 1210    Education Details  Continued Pt edu for LE self care protocols throughout session whhile performing MLD    Person(s) Educated  Patient    Methods  Demonstration;Explanation;Tactile cues;Verbal cues;Handout    Comprehension  Verbalized understanding;Returned demonstration          OT Long Term Goals - 04/07/19 1741      OT LONG  TERM GOAL #1   Title  Pt independent with lymphedema precautions and prevention strategies to limit LE progression.    Baseline  Max A    Time  4    Period  Days    Status  Achieved      OT LONG TERM GOAL #2   Title  Pt will be independent with lymphedema self-care home program and AWS ther ex to improve functional performance of basic and instrumental ADLs by DC.    Baseline  Max A    Time  12    Period  Weeks    Status  Achieved      OT LONG TERM GOAL #3   Title  Pt wil report 0/10 pain with RUE functional use ( reaching, lifting, carrying, dressing, dressing, etc) to achieve independent , pain free ADLs performance.    Baseline  Max A    Time  12    Period  Weeks    Status  New            Plan - 05/01/19 1213    Clinical Impression Statement  Hand  swelling slightly decreased again today , but no change compared with last session. Pt reports cording is much better, but she continues to feel pulling and pinching sensations extending distally accross antecubital fossa to distal volar FA. Manual therapy  emphasis today on MLD only. No cording palpable. Cont as per POC. Next visit determine specs and measure for compression sleeve and glove.    OT Occupational Profile and History  Comprehensive Assessment- Review of records and extensive additional review of physical, cognitive, psychosocial history related to current functional performance    Occupational performance deficits (Please refer to evaluation for details):  ADL's;IADL's;Social Participation;Leisure;Rest and Sleep;Work    Marketing executive / Function / Physical Skills  ADL;Decreased knowledge of precautions;ROM;UE functional use;Decreased knowledge of use of DME;Edema;Skin integrity;Pain;IADL    Rehab Potential  Good    Clinical Decision Making  Several treatment options, min-mod task modification necessary    Comorbidities Affecting Occupational Performance:  Presence of comorbidities impacting occupational performance    Comorbidities impacting occupational performance description:  see SUBJUECTIVE    Modification or Assistance to Complete Evaluation   Min-Moderate modification of tasks or assist with assess necessary to complete eval    OT Frequency  2x / week    OT Duration  12 weeks    OT Treatment/Interventions  Self-care/ADL training;Therapeutic exercise;Manual lymph drainage;Patient/family education;Therapeutic activities;DME and/or AE instruction;Manual Therapy;Other (comment)   consider fitting with prophylactic cmopression arm sleeve   Plan  BUE AROM ther ex to reduce axillary cording- 2 sets of 10 2-3 x daily    Consulted and Agree with Plan of Care  Patient       Patient will benefit from skilled therapeutic intervention in order to improve the following deficits and  impairments:   Body Structure / Function / Physical Skills: ADL, Decreased knowledge of precautions, ROM, UE functional use, Decreased knowledge of use of DME, Edema, Skin integrity, Pain, IADL       Visit Diagnosis: Postmastectomy lymphedema syndrome    Problem List Patient Active Problem List   Diagnosis Date Noted  . Edema 04/16/2019  . Postoperative wound infection 03/13/2019  . Seroma of breast 03/11/2019  . Age-related osteoporosis without current pathological fracture 02/27/2019  . Malignant neoplasm of central portion of right breast in female, estrogen receptor positive ([place]) 02/12/2019  . Genetic testing 01/31/2019  . Malignant neoplasm of upper-inner quadrant  of right breast in female, estrogen receptor positive ([place]) 01/15/2019  . Long term current use of opiate analgesic 12/02/2018  . Tympanic membrane central perforation, left 11/04/2018  . DDD (degenerative disc disease), lumbosacral 10/10/2018  . Abnormal MRI, lumbar spine (08/29/2018) 10/10/2018  . 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 Lumbar nerve root impingement (Bilateral: L4 & L5) (L>R: L5) 10/10/2018  . Lumbar Spinal instability of L4-5 10/10/2018  . Lumbar facet hypertrophy 10/10/2018  . COPD (chronic obstructive pulmonary disease) with emphysema ([place]) 08/28/2018  . Elevated rheumatoid factor 08/13/2018  . Elevated C-reactive protein (CRP) 08/12/2018  . Elevated sed rate 08/12/2018  . Lumbar facet syndrome (Bilateral) 08/12/2018  . Back pain with left-sided radiculopathy 08/12/2018  . DDD (degenerative disc disease), lumbar 08/12/2018  . Chronic lower extremity pain (Primary Area of Pain) (Bilateral) (L>R) 07/23/2018  . Chronic low back pain (Secondary Area of Pain) (Bilateral) (L>R) w/ sciatica (Bilateral) 07/23/2018  . Chronic pain syndrome 07/23/2018  . Pharmacologic therapy 07/23/2018  . Disorder of skeletal system 07/23/2018  . Problems influencing  health status 07/23/2018  . Chronic sacroiliac joint pain [facility] Area of Pain) (Bilateral) (L>R) 07/23/2018  . Status post lumboperitoneal shunt placement 02/26/2018  . 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 Tympanic membrane perforation 05/30/2016  . PMB (postmenopausal bleeding) 04/26/2016  . Rash 12/27/2015  . 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 Plantar fasciitis of foot (Left) 05/26/2015  . Spondylosis of lumbar region without myelopathy or radiculopathy 05/26/2015  . Lumbar foraminal stenosis (L3-4, L4-5, L5-S1) (L>R) 05/26/2015  . Spinal stenosis of lumbar region with neurogenic claudication 05/26/2015  . Encounter for screening mammogram for malignant neoplasm of breast 12/02/2014  . Goiter 12/02/2014  . Chronic idiopathic gout of foot (Right) 07/06/2014  . Biceps tendonitis (Right) 05/19/2014  . Shoulder pain (Right) 05/19/2014  . Facial weakness 05/05/2014  . Post herpetic neuralgia 05/05/2014  . Chronic dysfunction of left eustachian tube 03/24/2014  . Osteopenia 03/13/2014  . Lumbar Grade 1 Anterolisthesis of L3/4 (58mm) & L4/5 (4-34mm)(w/ Dynamic instability) 03/13/2014  . Family history of colon cancer 12/02/2013  . Dyshidrotic eczema 11/17/2013  . Diastasis recti 04/08/2013  . Facial nerve paresis 07/31/2012  . Blepharospasm 05/08/2012  . 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 Obesity 03/28/2012  . Hemoptysis 12/22/2011  . Neuropathic postherpetic trigeminal neuralgia 12/12/2011  . Allergic rhinitis, mild 10/12/2011  . Disequilibrium syndrome 09/27/2011  . Chronic bronchitis ([place]) 06/05/2011  . Essential hypertension 05/24/2011  . Type 2 diabetes mellitus without complications ([place]) 79/48/0165  . Pulmonary nodules 05/05/2011    Andrey Spearman, MS, OTR/L, [facility] 05/01/19 12:16 PM  [place] MAIN  [facility] SERVICES [address] [place], [place], [address] Phone: [phone]   Fax:  [phone]  Name: Kerissa Coia MRN: [id number] Date of Birth: 31-Jul-1951

## 2019-05-02 ENCOUNTER — Encounter: Payer: Medicare Other | Admitting: Occupational Therapy

## 2019-05-05 ENCOUNTER — Ambulatory Visit: Payer: Medicare Other | Admitting: Occupational Therapy

## 2019-05-05 ENCOUNTER — Other Ambulatory Visit: Payer: Self-pay

## 2019-05-05 DIAGNOSIS — I972 Postmastectomy lymphedema syndrome: Secondary | ICD-10-CM

## 2019-05-05 DIAGNOSIS — M545 Low back pain: Secondary | ICD-10-CM | POA: Diagnosis not present

## 2019-05-05 NOTE — Therapy (Signed)
[place] MAIN [facility] SERVICES [address] [place], [place], [address] Phone: [phone]   Fax:  [phone]  Occupational Therapy Treatment  Patient Details  Name: Susan Callahan MRN: [id number] Date of Birth: Jul 15, 1951 Referring Provider (OT): Humberto Leep, MD   Encounter Date: 05/05/2019  OT End of Session - 05/05/19 1311    Visit Number  20    Number of Visits  36    Date for OT Re-Evaluation  05/20/19    OT Start Time  0110    OT Stop Time  0225    OT Time Calculation (min)  75 min    Activity Tolerance  Patient tolerated treatment well;No increased pain    Behavior During Therapy  WFL for tasks assessed/performed       Past Medical History:  Diagnosis Date  . COPD (chronic obstructive pulmonary disease) ([place])   . Depression 1974  . Diabetes mellitus without complication ([place])   . Hypertension   . Migraine   . 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 LUMBAR PERITONEAL SHUNT    . TYMPANOSTOMY TUBE PLACEMENT      There were no vitals filed for this visit.  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 Ultrasound of RUE on 05/01/19 is  negative for DVT.    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 01/30/19 partial mastectomy. Margins clear. 0/2 LN  negative for  disease. actinic keritosis,DDD, DM, obesity, thyroid nodules, pulmonary nodules, chronic back pain, HTN, COPD, hx depression    Limitations  pain and swelling in R axilla and arm, chronic back pain, muscle weakness, impaired balance, decreased RUE AROM, abnormal gait, difficulty walking, impaired functional mobility and transfers,    Repetition  Increases Symptoms    Special Tests  -Stemmer at MPs bilaterally    Patient Stated Goals  relduce pain and improve  movement  in my R arm so I can do things I need to do. reduce risk of lymphedema    Pain Onset  More than a month ago    Pain Onset  More than a month ago                   OT Treatments/Exercises (OP) - 05/05/19 0001      ADLs   ADL Education Given  Yes      Manual Therapy   Manual Therapy  Edema management;Compression Bandaging;Manual Lymphatic Drainage (MLD);Myofascial release    Manual Lymphatic Drainage (MLD)  MLD to RUE/ RUQ  utilizing ipsilateral axillo-inguinal watershed, and anterior and posterior axillary anastamoses. Pt tolerated well in supine and sidelying. No myofacial release today.    Compression Bandaging  RUE gradient comnpression wrap   using 8. 10, and 12 cm short stretch wraps over Rosidal  foam and graduating circumferential thickness. Wrapped over OTS compression  glove in lieu of finger wraps which just get in the way and fall off easily w functional hand use.             OT Education - 05/05/19 1439    Education Details  Reviewed progress towards all OT goals , including new goal added   today for independent compression wrapping. Emphasis of Pt edu at emnd of session on gradient wrapping techniques. Pt  will practice next visit.    Person(s) Educated  Patient    Methods  Demonstration;Explanation;Tactile cues;Verbal cues;Handout    Comprehension  Verbalized understanding;Returned demonstration          OT Long Term Goals - 05/05/19 1314      OT LONG TERM GOAL #1   Title  Pt independent with modified lymphedema precautions and prevention strategies to limit LE progression.    Baseline  Max A    Time  4    Period  Days    Status  Achieved      OT LONG TERM GOAL #2   Title  Pt will be independent with lymphedema self-care home program and AWS ther ex to improve functional performance of basic and instrumental ADLs by DC.    Baseline  Max A    Time  12    Period  Weeks    Status  Achieved      OT LONG TERM GOAL #3   Title  Pt wil  report 0/10 pain with RUE functional use ( reaching, lifting, carrying, dressing, dressing, etc) to achieve independent , pain free ADLs performance.    Baseline  Max A 05/05/19: Reporting 4/10 lymphadema pain now combined with AWS related pain.    Time  12    Period  Weeks    Status  Partially Met      OT LONG TERM GOAL #4   Title  Pt will be able to apply gradient compression wraps using correct techniques independently to control UE swelling and limit LE progression.    Baseline  Max A    Time  1    Period  Weeks    Status  New    Target Date  05/16/19            Plan - 05/05/19 1441    Clinical Impression Statement  R forearm and dorsal hane swelling is mildly decreased today compared with last visit; however' stubborn thickened lymphedema persists between MPs. Pt reports pain is improved at AROM end ranges 2/2 cording, but she is now experiencing pain/ discomfort typically associaed w/ LE, including RUE heaviness, RUE tiredness, heaviness, aching. Pt fully participated in Pt edu throughout session. She was able to name 6 LE precautions and prevention principles with verbal cues and reference to printed LE handbook. She performed J Stroke for simple self MLD with max assist including verbal and tactile cues. Pt demonstrates progress towards compliance with home program.While cording is less painful and restrictive than it was initially, presentation now includes mild, stage 1, RUE lymphedema with concentration mainly in the hand. Cont 2-3 x weekly as per POC. Fit with compression sleeve and glove ASAP. into fluctuating    OT Occupational Profile and History  Comprehensive Assessment- Review of records and extensive additional review of physical, cognitive, psychosocial history related to current functional performance    Occupational performance deficits (Please refer to evaluation for details):  ADL's;IADL's;Social Participation;Leisure;Rest and Sleep;Work    Marketing executive / Function /  Physical Skills  ADL;Decreased knowledge of precautions;ROM;UE functional use;Decreased knowledge of use of DME;Edema;Skin integrity;Pain;IADL    Rehab Potential  Good    Clinical Decision Making  Several treatment options, min-mod task modification necessary    Comorbidities Affecting Occupational Performance:  Presence of comorbidities impacting occupational performance    Comorbidities impacting occupational performance description:  see SUBJUECTIVE    Modification or Assistance to Complete Evaluation   Min-Moderate modification of tasks or assist with assess necessary  to complete eval    OT Frequency  2x / week    OT Duration  12 weeks    OT Treatment/Interventions  Self-care/ADL training;Therapeutic exercise;Manual lymph drainage;Patient/family education;Therapeutic activities;DME and/or AE instruction;Manual Therapy;Other (comment)   consider fitting with prophylactic cmopression arm sleeve   Plan  BUE AROM ther ex to reduce axillary cording- 2 sets of 10 2-3 x daily    Consulted and Agree with Plan of Care  Patient       Patient will benefit from skilled therapeutic intervention in order to improve the following deficits and impairments:   Body Structure / Function / Physical Skills: ADL, Decreased knowledge of precautions, ROM, UE functional use, Decreased knowledge of use of DME, Edema, Skin integrity, Pain, IADL       Visit Diagnosis: Post-mastectomy lymphedema syndrome    Problem List Patient Active Problem List   Diagnosis Date Noted  . Edema 04/16/2019  . Postoperative wound infection 03/13/2019  . Seroma of breast 03/11/2019  . Age-related osteoporosis without current pathological fracture 02/27/2019  . Malignant neoplasm of central portion of right breast in female, estrogen receptor positive ([place]) 02/12/2019  . Genetic testing 01/31/2019  . Malignant neoplasm of upper-inner quadrant of right breast in female, estrogen receptor positive ([place]) 01/15/2019  . Long term  current use of opiate analgesic 12/02/2018  . Tympanic membrane central perforation, left 11/04/2018  . DDD (degenerative disc disease), lumbosacral 10/10/2018  . Abnormal MRI, lumbar spine (08/29/2018) 10/10/2018  . 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 Lumbar nerve root impingement (Bilateral: L4 & L5) (L>R: L5) 10/10/2018  . Lumbar Spinal instability of L4-5 10/10/2018  . Lumbar facet hypertrophy 10/10/2018  . COPD (chronic obstructive pulmonary disease) with emphysema ([employer]) 08/28/2018  . Elevated rheumatoid factor 08/13/2018  . Elevated C-reactive protein (CRP) 08/12/2018  . Elevated sed rate 08/12/2018  . Lumbar facet syndrome (Bilateral) 08/12/2018  . Back pain with left-sided radiculopathy 08/12/2018  . DDD (degenerative disc disease), lumbar 08/12/2018  . Chronic lower extremity pain (Primary Area of Pain) (Bilateral) (L>R) 07/23/2018  . Chronic low back pain (Secondary Area of Pain) (Bilateral) (L>R) w/ sciatica (Bilateral) 07/23/2018  . Chronic pain syndrome 07/23/2018  . Pharmacologic therapy 07/23/2018  . Disorder of skeletal system 07/23/2018  . Problems influencing health status 07/23/2018  . Chronic sacroiliac joint pain [facility]) (Bilateral) (L>R) 07/23/2018  . Status post lumboperitoneal shunt placement 02/26/2018  . 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 Tympanic membrane perforation 05/30/2016  . PMB (postmenopausal bleeding) 04/26/2016  . Rash 12/27/2015  . 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 Plantar fasciitis of foot (Left) 05/26/2015  . Spondylosis of lumbar region without myelopathy or radiculopathy 05/26/2015  . Lumbar foraminal stenosis (L3-4, L4-5, L5-S1) (L>R) 05/26/2015  . Spinal stenosis of lumbar region with neurogenic claudication 05/26/2015  . Encounter for screening mammogram for malignant neoplasm of breast  12/02/2014  . Goiter 12/02/2014  . Chronic idiopathic gout of foot (Right) 07/06/2014  . Biceps tendonitis (Right) 05/19/2014  . Shoulder pain (Right) 05/19/2014  . Facial weakness 05/05/2014  . Post herpetic neuralgia 05/05/2014  . Chronic dysfunction of left eustachian tube 03/24/2014  . Osteopenia 03/13/2014  . Lumbar Grade 1 Anterolisthesis of L3/4 (51m) & L4/5 (4-137m(w/ Dynamic instability) 03/13/2014  . Family history of colon cancer 12/02/2013  . Dyshidrotic eczema 11/17/2013  . Diastasis recti 04/08/2013  . Facial nerve paresis 07/31/2012  .  Blepharospasm 05/08/2012  . 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 Obesity 03/28/2012  . Hemoptysis 12/22/2011  . Neuropathic postherpetic trigeminal neuralgia 12/12/2011  . Allergic rhinitis, mild 10/12/2011  . Disequilibrium syndrome 09/27/2011  . Chronic bronchitis ([place]) 06/05/2011  . Essential hypertension 05/24/2011  . Type 2 diabetes mellitus without complications ([place]) 81/44/8185  . Pulmonary nodules 05/05/2011    Andrey Spearman, MS, OTR/L, [facility] 05/05/19 2:48 PM  [place] MAIN [facility] SERVICES [address] [place], [place], [address] Phone: [phone]   Fax:  [phone]  Name: Susan Callahan MRN: [id number] Date of Birth: [birth year]

## 2019-05-06 ENCOUNTER — Ambulatory Visit: Payer: Medicare Other

## 2019-05-06 ENCOUNTER — Other Ambulatory Visit: Payer: Self-pay

## 2019-05-06 ENCOUNTER — Ambulatory Visit: Payer: Medicare Other | Admitting: Occupational Therapy

## 2019-05-06 DIAGNOSIS — R293 Abnormal posture: Secondary | ICD-10-CM

## 2019-05-06 DIAGNOSIS — M545 Low back pain: Secondary | ICD-10-CM | POA: Diagnosis not present

## 2019-05-06 DIAGNOSIS — M6281 Muscle weakness (generalized): Secondary | ICD-10-CM

## 2019-05-06 DIAGNOSIS — I972 Postmastectomy lymphedema syndrome: Secondary | ICD-10-CM

## 2019-05-06 DIAGNOSIS — R2689 Other abnormalities of gait and mobility: Secondary | ICD-10-CM

## 2019-05-06 DIAGNOSIS — G8929 Other chronic pain: Secondary | ICD-10-CM

## 2019-05-06 NOTE — Therapy (Signed)
[facility] SERVICES [address] [place], [place], [address] Phone: [phone]   Fax:  [phone]  Physical Therapy Treatment/ RECERT/Physical Therapy Progress Note   Dates of reporting period  03/24/19  to   05/06/19   Patient Details  Name: Susan Callahan MRN: [id number] Date of Birth: 09-01-1951 Referring Provider (PT): Francesco Sor   Encounter Date: 05/06/2019  PT End of Session - 05/06/19 1735    Visit Number  50    Number of Visits  57    Date for PT Re-Evaluation  06/03/19    Authorization Type  10/10 ; next session 1/10 PN on 05/06/19    PT Start Time  1515    PT Stop Time  1559    PT Time Calculation (min)  44 min    Activity Tolerance  Patient tolerated treatment well    Behavior During Therapy  [facility] for tasks assessed/performed       Past Medical History:  Diagnosis Date  . COPD (chronic obstructive pulmonary disease) ([place])   . Depression 1974  . Diabetes mellitus without complication ([place])   . Hypertension   . Migraine   . 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 LUMBAR PERITONEAL SHUNT    . TYMPANOSTOMY TUBE PLACEMENT      There were no vitals filed for this visit.  Subjective Assessment - 05/06/19 1734    Subjective  Patient cleared of blood clot at this time, still experiencing excessive edema. Patient going to podiatrist on Thursday to find out if she has gotten plantar fasciatis .  Reports her back felt fine today and suddenly her lateral legs are hurting again.    Pertinent History  Pain began in 2007 and has gradually got worse and has progressed to nonstop. Patient states pain last all day long, everyday with no change in symptoms. Medication helps briefly. States she has difficulty with showering and standing. States she has to stand with her knee bent due to pulling sensation in legs. Pain begins immediately when stands and begins to walk.  Pain feels better when she bends her knees when standing. Pain became constant and non stop around Dec 2018. Denies bowel or bladder changes, unexplained weakness, or unexpected weight loss/gain. She currently goes to pulmonary rehab 3x a week, but has increased pain with treadmill walking. Will walk with cane on occasion around house especially at night due to occasional dizziness and vision impairments in medical history. States she also has difficulty with carrying groceries up steps due to low back pain. She would like to be able to walk better and have less pain to be less reliant on medication.     Limitations  Lifting;Standing;Walking;House hold activities    How long can you stand comfortably?  pain begins immediately     How long can you walk comfortably?  pain begins immediately     Patient Stated Goals  walk better, have less pain    Currently in Pain?  Yes    Pain Score  3     Pain Location  Back    Pain Orientation  Lower    Pain Descriptors / Indicators  Aching    Pain Type  Chronic pain    Pain Radiating Towards  lateral thigh    Pain Onset  More than a month ago    Pain Frequency  Intermittent      Patient going  to podiatrist on Thursday to find out if she has gotten plantar fasciatis .  Reports her back felt fine today and suddenly her lateral legs are hurting again.   Goals:  VAS: 4/10  Ambulate on treadmill 10 minutes Strength: grossly 4/5 with no pain  MODI : 36% Make bed; stripped the bed today, did not have to stop and rest, pain level increased from pain free to 4/10 then decreased back again to 3/10.  Tolerate 10 minutes of upright posture: 5 minutes    Treat:    manual Hamstring stretch in supine with increasing range 60 seconds each LE Piriformis stretch R and L 30 second holds Roller to lumbar and thoracic paraspinals x 3 minutes. SADin figure 4 positionwith belt BLE5x 20 seconds inferior direction Calf rollout with "the stick" 2 minutes each  calf     Ther-ex: cueing for sequencing, positioning and muscle activation Hooklying HS stretch with DF/PF for sciatic nerve mobilityx20bilateral; Posterior pelvic tilts x15.tactileand verbalcue for appropriate technique5 seconds with hold, improved alignment posterior pelvic tilt with adduction squeeze in hooklying 15x 3 second holds TrA activation GTB marches 10x each LE  cross arms upright posture; marches with abdominal activation. 10x each LE seated on swiss ball:  -heel raises 15x each side  -opposite UE raises 10x each LE   Patient's condition has the potential to improve in response to therapy. Maximum improvement is yet to be obtained. The anticipated improvement is attainable and reasonable in a generally predictable time.  Patient reports she is feeling better and able to do more tasks around the house with decreased pain. Continues to be limited with walking but is progressing.                  PT Education - 05/06/19 1735    Education Details  exercise technique, goals, posture    Person(s) Educated  Patient    Methods  Explanation;Demonstration;Tactile cues;Verbal cues    Comprehension  Verbalized understanding;Returned demonstration;Verbal cues required;Tactile cues required       PT Short Term Goals - 05/06/19 1738      PT SHORT TERM GOAL #1   Title  Patient will be independent with completion of HEP to improve ability to complete functional tasks and functional ADLs.    Baseline  10/23: will give HEP next session 11/19: HEP compliance    Time  2    Period  Weeks    Status  Achieved      PT SHORT TERM GOAL #2   Title  Patient will report pain score less than 4/10 on VAS to demonstrate reduction of pain levels with functional tasks.     Baseline  10/23: 6/10 11/19: 5/10 12/12: 9/10 1/10: 7/10  2/11: 3/10     Time  2    Period  Weeks    Status  Achieved        PT Long Term Goals - 05/06/19 1738      PT LONG TERM GOAL #1   Title   Patient will report pain score of <3/10 on VAS for decreased pain levels and ease with functional activities.    Baseline  10/23: 6/10 11/19: 5/10 12/12 9/10 1/10: 7/10 2/11: 3/10 3/9: 6/10  5/26: 8/10 03/17/19: 7/10 8/3: 5/10 8/25: 4/10    Time  4    Period  Weeks    Status  Partially Met    Target Date  06/03/19      PT LONG TERM GOAL #2  Title  Patient will be able to ambulate on treadmill for 65mnutes without increase in back/leg pain demonstrating improved community ambulation and treadmill walking at pulmonary rehab    Baseline  10/23: 051mutes increases pain 11/19: 64m82mtes 12/12: has not had the chance to perform 1/10: was able to do 5 minutes without pain 2/11: able to do 5 minutes then had to bend over 3/9: 5 minutes 30 seconds at 2.0 mphs 5/26: had to walk 10 minutes at cancer center with extreme pain but was able to perform 7/6: walks 10 minutes with increase of pain by 2 points 8/3: able to perform with bent over posture    Time  4    Period  Weeks    Status  Achieved      PT LONG TERM GOAL #3   Title  Patient will have decreased modified oswestry score <50% for improved ability to complete household tasks with less pain levels.     Baseline  10/23: 62% 11/19: 42%      Time  4    Period  Weeks    Status  Achieved      PT LONG TERM GOAL #4   Title  Patient will score 4+/5 on BLE MMT to demonstrate improved glute strength and ability to carry groceries with increased ease up/down stairs at home.    Baseline  10/23: 4-/5 grossly 11/19: 4/5 grossly; hamstrings 4-/5 12/12: 4/5 gross 1/10: 4/5 gross 2/11: gross 4+/5 with hamstrings and abuductors 4-/5 3/9: gross 4+/5 hamstrings and abductors 4-/5 5/27: grossly 3+/5 with pain with LLE movements 7/6: grossly 4-/5 with pain with LLE movement 8/3: deferred due to swelling/pain 8/25: grossly 4/5 without pain    Time  4    Period  Weeks    Status  Partially Met    Target Date  06/03/19      PT LONG TERM GOAL #5   Title  Patient will  have decreased modified oswestry score <35% for improved ability to complete household tasks with less pain levels.     Baseline  11/19: 42% 12/12: 52% 1/10: 48%  2/11: 30%     Time  4    Period  Weeks    Status  Achieved      PT LONG TERM GOAL #6   Title  Patient will be able to make and/or strip the bed without pain and without needing rest breaks to perform iADLs independently.     Baseline  2/11: pain limits ability to perform, ~4 breaks required 3/9: takes her time, 3 breaks 5/27: very painful still 7/6: requires only one rest break 8/3: able to perform with +2 pain increase 8/25: no rest breaks required, increased pain 4 points    Time  4    Period  Weeks    Status  Partially Met    Target Date  06/03/19      PT LONG TERM GOAL #7   Title  Patient will have decreased modified oswestry score <20% for improved ability to complete household tasks with less pain levels.     Baseline  2/11: 30%  3/9: 30% 5/27: 63% 7/6: 38% 8/3: 42% 8/25: 36%    Time  4    Period  Weeks    Status  Partially Met    Target Date  06/03/19      PT LONG TERM GOAL #8   Title  Patient will tolerate >10 minutes of upright postural positioning of trunk with active mobility with <2  degrees VAS increase.    Baseline  8/3: unable to tolerate 10  minutes 8/25: tolerates 5 minutes    Time  4    Period  Weeks    Status  Partially Met    Target Date  06/03/19            Plan - 05/06/19 1742    Clinical Impression Statement  Patient is progressing towards functional goals at this time. Decreased pain is noted with improved capacity for functional mobility. Ambulation continues to challenge patient however is improving with ability to perform. Patient's condition has the potential to improve in response to therapy. Maximum improvement is yet to be obtained. The anticipated improvement is attainable and reasonable in a generally predictable time.Patient will continue to benefit from skilled physical therapy to  improve strength, to reduce pain levels with ADLs, and to improve quality of life.    Rehab Potential  Fair    Clinical Impairments Affecting Rehab Potential  (+) motivation (-) chronicity of symptoms    PT Frequency  2x / week    PT Duration  4 weeks    PT Treatment/Interventions  ADLs/Self Care Home Management;Cryotherapy;Moist Heat;Traction;Iontophoresis 14m/ml Dexamethasone;Ultrasound;Gait training;Stair training;Functional mobility training;Neuromuscular re-education;Balance training;Therapeutic exercise;Therapeutic activities;Patient/family education;Manual techniques;Energy conservation;Aquatic Therapy;Electrical Stimulation;Passive range of motion;Dry needling;Taping    PT Next Visit Plan  progress strengthening and postural mechanics    PT Home Exercise Plan  No updates this day    Consulted and Agree with Plan of Care  Patient       Patient will benefit from skilled therapeutic intervention in order to improve the following deficits and impairments:  Abnormal gait, Cardiopulmonary status limiting activity, Decreased activity tolerance, Decreased balance, Decreased endurance, Decreased range of motion, Difficulty walking, Decreased strength, Hypomobility, Decreased mobility, Impaired perceived functional ability, Impaired flexibility, Postural dysfunction, Obesity, Improper body mechanics, Pain  Visit Diagnosis: Other abnormalities of gait and mobility  Chronic low back pain, unspecified back pain laterality, unspecified whether sciatica present  Abnormal posture  Muscle weakness (generalized)     Problem List Patient Active Problem List   Diagnosis Date Noted  . Edema 04/16/2019  . Postoperative wound infection 03/13/2019  . Seroma of breast 03/11/2019  . Age-related osteoporosis without current pathological fracture 02/27/2019  . Malignant neoplasm of central portion of right breast in female, estrogen receptor positive ([place] 02/12/2019  . Genetic testing 01/31/2019  .  Malignant neoplasm of upper-inner quadrant of right breast in female, estrogen receptor positive ([place] 01/15/2019  . Long term current use of opiate analgesic 12/02/2018  . Tympanic membrane central perforation, left 11/04/2018  . DDD (degenerative disc disease), lumbosacral 10/10/2018  . Abnormal MRI, lumbar spine (08/29/2018) 10/10/2018  . 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 Lumbar nerve root impingement (Bilateral: L4 & L5) (L>R: L5) 10/10/2018  . Lumbar Spinal instability of L4-5 10/10/2018  . Lumbar facet hypertrophy 10/10/2018  . COPD (chronic obstructive pulmonary disease) with emphysema ([place] 08/28/2018  . Elevated rheumatoid factor 08/13/2018  . Elevated C-reactive protein (CRP) 08/12/2018  . Elevated sed rate 08/12/2018  . Lumbar facet syndrome (Bilateral) 08/12/2018  . Back pain with left-sided radiculopathy 08/12/2018  . DDD (degenerative disc disease), lumbar 08/12/2018  . Chronic lower extremity pain (Primary Area of Pain) (Bilateral) (L>R) 07/23/2018  . Chronic low back pain (Secondary Area of Pain) (Bilateral) (L>R) w/ sciatica (Bilateral) 07/23/2018  . Chronic pain syndrome 07/23/2018  . Pharmacologic therapy 07/23/2018  . Disorder of skeletal system 07/23/2018  .  Problems influencing health status 07/23/2018  . Chronic sacroiliac joint pain [facility] Area of Pain) (Bilateral) (L>R) 07/23/2018  . Status post lumboperitoneal shunt placement 02/26/2018  . 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 Tympanic membrane perforation 05/30/2016  . PMB (postmenopausal bleeding) 04/26/2016  . Rash 12/27/2015  . 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 Plantar fasciitis of foot (Left) 05/26/2015  . Spondylosis of lumbar region without myelopathy or radiculopathy 05/26/2015  . Lumbar foraminal stenosis (L3-4, L4-5, L5-S1) (L>R) 05/26/2015  . Spinal stenosis of lumbar  region with neurogenic claudication 05/26/2015  . Encounter for screening mammogram for malignant neoplasm of breast 12/02/2014  . Goiter 12/02/2014  . Chronic idiopathic gout of foot (Right) 07/06/2014  . Biceps tendonitis (Right) 05/19/2014  . Shoulder pain (Right) 05/19/2014  . Facial weakness 05/05/2014  . Post herpetic neuralgia 05/05/2014  . Chronic dysfunction of left eustachian tube 03/24/2014  . Osteopenia 03/13/2014  . Lumbar Grade 1 Anterolisthesis of L3/4 (51m) & L4/5 (4-154m(w/ Dynamic instability) 03/13/2014  . Family history of colon cancer 12/02/2013  . Dyshidrotic eczema 11/17/2013  . Diastasis recti 04/08/2013  . Facial nerve paresis 07/31/2012  . Blepharospasm 05/08/2012  . 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 Obesity 03/28/2012  . Hemoptysis 12/22/2011  . Neuropathic postherpetic trigeminal neuralgia 12/12/2011  . Allergic rhinitis, mild 10/12/2011  . Disequilibrium syndrome 09/27/2011  . Chronic bronchitis ([place]09/24/2012  . Essential hypertension 05/24/2011  . Type 2 diabetes mellitus without complications ([id number]. Pulmonary nodules 05/05/2011   MaJanna ArchPT, DPT   05/06/2019, 5:42 PM  [place] [facility] LtdERVICES [address] [id number]: [phone] Fax:  [phone]: LiTiyanna LarcomRN: [id number] of Birth: [birth year]

## 2019-05-06 NOTE — Therapy (Signed)
[place] MAIN [facility] SERVICES [address] [place], [place], [address] Phone: [phone]   Fax:  [phone]  Occupational Therapy Treatment  Patient Details  Name: Susan Callahan MRN: [id number] Date of Birth: 04-28-1951 Referring Provider (OT): Humberto Leep, MD   Encounter Date: 05/06/2019  OT End of Session - 05/06/19 1635    Visit Number  21    Number of Visits  36    Date for OT Re-Evaluation  05/20/19    OT Start Time  0200    OT Stop Time  0310    OT Time Calculation (min)  70 min    Activity Tolerance  Patient tolerated treatment well;No increased pain    Behavior During Therapy  WFL for tasks assessed/performed       Past Medical History:  Diagnosis Date  . COPD (chronic obstructive pulmonary disease) ([place])   . Depression 1974  . Diabetes mellitus without complication ([place])   . Hypertension   . Migraine   . 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 LUMBAR PERITONEAL SHUNT    . TYMPANOSTOMY TUBE PLACEMENT      There were no vitals filed for this visit.  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 Pt presents w/ OTS compression glove in place with worsened hand swelling .    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 01/30/19 partial mastectomy. Margins clear. 0/2 LN  negative for  disease. actinic keritosis,DDD, DM, obesity, thyroid nodules, pulmonary nodules, chronic back pain, HTN, COPD, hx depression    Limitations  pain and swelling in R axilla and arm, chronic back pain, muscle weakness, impaired balance, decreased RUE AROM, abnormal gait, difficulty walking, impaired functional mobility and transfers,    Repetition  Increases Symptoms    Special Tests  -Stemmer at MPs bilaterally    Patient Stated Goals   relduce pain and improve movement  in my R arm so I can do things I need to do. reduce risk of lymphedema    Pain Onset  More than a month ago    Pain Onset  More than a month ago                   OT Treatments/Exercises (OP) - 05/06/19 0001      ADLs   ADL Education Given  Yes      Manual Therapy   Manual Therapy  Edema management;Compression Bandaging    Edema Management  Comleted anatomical measurements for custom ccl 2 compression glove and arm sleeve. Faxed to vendor.    Compression Bandaging  RUE gradient comnpression wrap   using 8. 10, and 12 cm short stretch wraps over Rosidal  foam and graduating circumferential thickness. Wrapped over OTS compression  glove in lieu of finger wraps which just get in the way and fall off easily w functional hand use.             OT Education - 05/06/19 4888    Education Details  Pt education on garment recommendations , measurement and fitting process. Pt edu for self wrapping. Pt edu for Flexitouch sequential pneumatic compression device.    Person(s) Educated  Patient    Methods  Demonstration;Explanation;Tactile cues;Verbal cues;Handout    Comprehension  Verbalized understanding;Returned  demonstration          OT Long Term Goals - 05/05/19 1314      OT LONG TERM GOAL #1   Title  Pt independent with modified lymphedema precautions and prevention strategies to limit LE progression.    Baseline  Max A    Time  4    Period  Days    Status  Achieved      OT LONG TERM GOAL #2   Title  Pt will be independent with lymphedema self-care home program and AWS ther ex to improve functional performance of basic and instrumental ADLs by DC.    Baseline  Max A    Time  12    Period  Weeks    Status  Achieved      OT LONG TERM GOAL #3   Title  Pt wil report 0/10 pain with RUE functional use ( reaching, lifting, carrying, dressing, dressing, etc) to achieve independent , pain free ADLs performance.    Baseline  Max A  05/05/19: Reporting 4/10 lymphadema pain now combined with AWS related pain.    Time  12    Period  Weeks    Status  Partially Met      OT LONG TERM GOAL #4   Title  Pt will be able to apply gradient compression wraps using correct techniques independently to control UE swelling and limit LE progression.    Baseline  Max A    Time  1    Period  Weeks    Status  New    Target Date  05/16/19            Plan - 05/06/19 1636    Clinical Impression Statement  Completed anatomical measurements for RUE custom, flat knit, ccl 2 ( 25-35 mmHg) Jobst Elvarex SOFT and Elvares SOFT SEAMLESS compression armsleeve and glove. Provided Pt edu regarding possible benefits of Flexitouch sequential pneumatic compression device ("pump") for LE self management over time. Flexitouch is the only device that , like MLD, provides sequential massage fromn proximal to distal lymphatics, including to the the contralateral anterior  axillary anastamosis. After skilled teaching Pt able to apply gradient compression wraps to hand and R UE using correct techniques with mod A. Cont as per POC./ Scedule Flexi trial ASAP. Vendor faxed demographiocs to check benefits.    OT Occupational Profile and History  Comprehensive Assessment- Review of records and extensive additional review of physical, cognitive, psychosocial history related to current functional performance    Occupational performance deficits (Please refer to evaluation for details):  ADL's;IADL's;Social Participation;Leisure;Rest and Sleep;Work    Marketing executive / Function / Physical Skills  ADL;Decreased knowledge of precautions;ROM;UE functional use;Decreased knowledge of use of DME;Edema;Skin integrity;Pain;IADL    Rehab Potential  Good    Clinical Decision Making  Several treatment options, min-mod task modification necessary    Comorbidities Affecting Occupational Performance:  Presence of comorbidities impacting occupational performance    Comorbidities  impacting occupational performance description:  see SUBJUECTIVE    Modification or Assistance to Complete Evaluation   Min-Moderate modification of tasks or assist with assess necessary to complete eval    OT Frequency  2x / week    OT Duration  12 weeks    OT Treatment/Interventions  Self-care/ADL training;Therapeutic exercise;Manual lymph drainage;Patient/family education;Therapeutic activities;DME and/or AE instruction;Manual Therapy;Other (comment)   consider fitting with prophylactic cmopression arm sleeve   Plan  BUE AROM ther ex to reduce axillary cording- 2 sets of 10 2-3  x daily    Consulted and Agree with Plan of Care  Patient       Patient will benefit from skilled therapeutic intervention in order to improve the following deficits and impairments:   Body Structure / Function / Physical Skills: ADL, Decreased knowledge of precautions, ROM, UE functional use, Decreased knowledge of use of DME, Edema, Skin integrity, Pain, IADL       Visit Diagnosis: Post-mastectomy lymphedema syndrome    Problem List Patient Active Problem List   Diagnosis Date Noted  . Edema 04/16/2019  . Postoperative wound infection 03/13/2019  . Seroma of breast 03/11/2019  . Age-related osteoporosis without current pathological fracture 02/27/2019  . Malignant neoplasm of central portion of right breast in female, estrogen receptor positive ([place]) 02/12/2019  . Genetic testing 01/31/2019  . Malignant neoplasm of upper-inner quadrant of right breast in female, estrogen receptor positive ([place]) 01/15/2019  . Long term current use of opiate analgesic 12/02/2018  . Tympanic membrane central perforation, left 11/04/2018  . DDD (degenerative disc disease), lumbosacral 10/10/2018  . Abnormal MRI, lumbar spine (08/29/2018) 10/10/2018  . 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 Lumbar nerve root impingement (Bilateral: L4 & L5) (L>R: L5) 10/10/2018  . Lumbar  Spinal instability of L4-5 10/10/2018  . Lumbar facet hypertrophy 10/10/2018  . COPD (chronic obstructive pulmonary disease) with emphysema ([place]) 08/28/2018  . Elevated rheumatoid factor 08/13/2018  . Elevated C-reactive protein (CRP) 08/12/2018  . Elevated sed rate 08/12/2018  . Lumbar facet syndrome (Bilateral) 08/12/2018  . Back pain with left-sided radiculopathy 08/12/2018  . DDD (degenerative disc disease), lumbar 08/12/2018  . Chronic lower extremity pain (Primary Area of Pain) (Bilateral) (L>R) 07/23/2018  . Chronic low back pain (Secondary Area of Pain) (Bilateral) (L>R) w/ sciatica (Bilateral) 07/23/2018  . Chronic pain syndrome 07/23/2018  . Pharmacologic therapy 07/23/2018  . Disorder of skeletal system 07/23/2018  . Problems influencing health status 07/23/2018  . Chronic sacroiliac joint pain [facility] Area of Pain) (Bilateral) (L>R) 07/23/2018  . Status post lumboperitoneal shunt placement 02/26/2018  . 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 Tympanic membrane perforation 05/30/2016  . PMB (postmenopausal bleeding) 04/26/2016  . Rash 12/27/2015  . 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 Plantar fasciitis of foot (Left) 05/26/2015  . Spondylosis of lumbar region without myelopathy or radiculopathy 05/26/2015  . Lumbar foraminal stenosis (L3-4, L4-5, L5-S1) (L>R) 05/26/2015  . Spinal stenosis of lumbar region with neurogenic claudication 05/26/2015  . Encounter for screening mammogram for malignant neoplasm of breast 12/02/2014  . Goiter 12/02/2014  . Chronic idiopathic gout of foot (Right) 07/06/2014  . Biceps tendonitis (Right) 05/19/2014  . Shoulder pain (Right) 05/19/2014  . Facial weakness 05/05/2014  . Post herpetic neuralgia 05/05/2014  . Chronic dysfunction of left eustachian tube 03/24/2014  . Osteopenia 03/13/2014  . Lumbar Grade 1 Anterolisthesis of L3/4 (43m) & L4/5 (4-156m(w/ Dynamic  instability) 03/13/2014  . Family history of colon cancer 12/02/2013  . Dyshidrotic eczema 11/17/2013  . Diastasis recti 04/08/2013  . Facial nerve paresis 07/31/2012  . Blepharospasm 05/08/2012  . 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 Obesity 03/28/2012  . Hemoptysis 12/22/2011  . Neuropathic postherpetic trigeminal neuralgia 12/12/2011  . Allergic rhinitis, mild 10/12/2011  . Disequilibrium syndrome 09/27/2011  . Chronic bronchitis ([facility]09/24/2012  . Essential hypertension 05/24/2011  . Type 2 diabetes mellitus without complications (  [place]) 05/24/2011  . Pulmonary nodules 05/05/2011   Andrey Spearman, MS, OTR/L, [facility] 05/06/19 4:42 PM   [place] MAIN [facility] SERVICES [address] [place], [place], [address] Phone: [phone]   Fax:  [phone]  Name: Susan Callahan MRN: [id number] Date of Birth: [birth year]

## 2019-05-08 ENCOUNTER — Ambulatory Visit: Payer: Medicare Other

## 2019-05-08 ENCOUNTER — Other Ambulatory Visit: Payer: Self-pay

## 2019-05-08 ENCOUNTER — Ambulatory Visit: Payer: Medicare Other | Admitting: Occupational Therapy

## 2019-05-08 DIAGNOSIS — I972 Postmastectomy lymphedema syndrome: Secondary | ICD-10-CM

## 2019-05-08 DIAGNOSIS — G8929 Other chronic pain: Secondary | ICD-10-CM

## 2019-05-08 DIAGNOSIS — R293 Abnormal posture: Secondary | ICD-10-CM

## 2019-05-08 DIAGNOSIS — M545 Low back pain, unspecified: Secondary | ICD-10-CM

## 2019-05-08 DIAGNOSIS — M6281 Muscle weakness (generalized): Secondary | ICD-10-CM

## 2019-05-08 DIAGNOSIS — R2689 Other abnormalities of gait and mobility: Secondary | ICD-10-CM

## 2019-05-08 NOTE — Therapy (Signed)
[place] MAIN [facility] SERVICES [address] [place], [place], [address] Phone: [phone]   Fax:  [phone]  Occupational Therapy Treatment  Patient Details  Name: Susan Callahan MRN: [id number] Date of Birth: [birth year] Referring Provider (OT): Humberto Leep, MD   Encounter Date: 05/08/2019  OT End of Session - 05/08/19 1230    Visit Number  22    Number of Visits  36    Date for OT Re-Evaluation  05/20/19    OT Start Time  1100    OT Stop Time  1215    OT Time Calculation (min)  75 min    Activity Tolerance  Patient tolerated treatment well;No increased pain    Behavior During Therapy  WFL for tasks assessed/performed       Past Medical History:  Diagnosis Date  . COPD (chronic obstructive pulmonary disease) ([place])   . Depression 1974  . Diabetes mellitus without complication ([place])   . Hypertension   . Migraine   . 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 LUMBAR PERITONEAL SHUNT    . TYMPANOSTOMY TUBE PLACEMENT      There were no vitals filed for this visit.  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 Pt reports ccl 2 loaned compression glove loaned last session was too tight so she resumed use of OTS glove issued previously. Pt reports she was able to apply gradient compression wraps as instructed last session, but requests practice time in clinic today.    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 01/30/19 partial mastectomy. Margins clear. 0/2 LN  negative for  disease. actinic keritosis,DDD, DM, obesity, thyroid nodules, pulmonary nodules, chronic back pain, HTN, COPD, hx depression    Limitations  pain and swelling in R axilla and arm, chronic back pain, muscle weakness, impaired balance, decreased RUE AROM,  abnormal gait, difficulty walking, impaired functional mobility and transfers,    Repetition  Increases Symptoms    Special Tests  -Stemmer at MPs bilaterally    Patient Stated Goals  relduce pain and improve movement  in my R arm so I can do things I need to do. reduce risk of lymphedema    Currently in Pain?  Yes    Pain Onset  More than a month ago    Pain Onset  More than a month ago                   OT Treatments/Exercises (OP) - 05/08/19 0001      ADLs   ADL Education Given  Yes  (Pended)       Manual Therapy   Manual Therapy  Edema management;Manual Lymphatic Drainage (MLD);Compression Bandaging;Myofascial release  (Pended)     Manual Lymphatic Drainage (MLD)  MLD to RUE/ RUQ  utilizing ipsilateral axillo-inguinal watershed, and anterior and posterior axillary anastamoses. Pt tolerated well in supine and sidelying. No myofacial release today.  (Pended)     Compression Bandaging  RUE gradient comnpression wrap   using 8. 10, and 12 cm short stretch wraps over Rosidal  foam and graduating circumferential thickness. Wrapped over OTS compression  glove in lieu of finger wraps which just get in the way and fall off easily w functional hand use.  (Pended)  OT Education - 05/08/19 1229    Education Details  Cont Pt edu for gradient compression wrapping RUE    Person(s) Educated  Patient    Methods  Demonstration;Explanation;Tactile cues;Verbal cues;Handout    Comprehension  Verbalized understanding;Returned demonstration          OT Long Term Goals - 05/05/19 1314      OT LONG TERM GOAL #1   Title  Pt independent with modified lymphedema precautions and prevention strategies to limit LE progression.    Baseline  Max A    Time  4    Period  Days    Status  Achieved      OT LONG TERM GOAL #2   Title  Pt will be independent with lymphedema self-care home program and AWS ther ex to improve functional performance of basic and instrumental ADLs by  DC.    Baseline  Max A    Time  12    Period  Weeks    Status  Achieved      OT LONG TERM GOAL #3   Title  Pt wil report 0/10 pain with RUE functional use ( reaching, lifting, carrying, dressing, dressing, etc) to achieve independent , pain free ADLs performance.    Baseline  Max A 05/05/19: Reporting 4/10 lymphadema pain now combined with AWS related pain.    Time  12    Period  Weeks    Status  Partially Met      OT LONG TERM GOAL #4   Title  Pt will be able to apply gradient compression wraps using correct techniques independently to control UE swelling and limit LE progression.    Baseline  Max A    Time  1    Period  Weeks    Status  New    Target Date  05/16/19            Plan - 05/08/19 1231    Clinical Impression Statement  MLD as established to RUE/RUQ utilizing ipsilateral AI and contralateral AAA in supine and sidelying. Emphasis of manual therapy was MLD and fibrosis to forearm and hand. Assistant videotaped gradient compression wrap for Pt reference at home. Comnt as per POC.    OT Occupational Profile and History  Comprehensive Assessment- Review of records and extensive additional review of physical, cognitive, psychosocial history related to current functional performance    Occupational performance deficits (Please refer to evaluation for details):  ADL's;IADL's;Social Participation;Leisure;Rest and Sleep;Work    Marketing executive / Function / Physical Skills  ADL;Decreased knowledge of precautions;ROM;UE functional use;Decreased knowledge of use of DME;Edema;Skin integrity;Pain;IADL    Rehab Potential  Good    Clinical Decision Making  Several treatment options, min-mod task modification necessary    Comorbidities Affecting Occupational Performance:  Presence of comorbidities impacting occupational performance    Comorbidities impacting occupational performance description:  see SUBJUECTIVE    Modification or Assistance to Complete Evaluation   Min-Moderate  modification of tasks or assist with assess necessary to complete eval    OT Frequency  2x / week    OT Duration  12 weeks    OT Treatment/Interventions  Self-care/ADL training;Therapeutic exercise;Manual lymph drainage;Patient/family education;Therapeutic activities;DME and/or AE instruction;Manual Therapy;Other (comment)   consider fitting with prophylactic cmopression arm sleeve   Plan  BUE AROM ther ex to reduce axillary cording- 2 sets of 10 2-3 x daily    Consulted and Agree with Plan of Care  Patient       Patient will  benefit from skilled therapeutic intervention in order to improve the following deficits and impairments:   Body Structure / Function / Physical Skills: ADL, Decreased knowledge of precautions, ROM, UE functional use, Decreased knowledge of use of DME, Edema, Skin integrity, Pain, IADL       Visit Diagnosis: Postmastectomy lymphedema syndrome    Problem List Patient Active Problem List   Diagnosis Date Noted  . Edema 04/16/2019  . Postoperative wound infection 03/13/2019  . Seroma of breast 03/11/2019  . Age-related osteoporosis without current pathological fracture 02/27/2019  . Malignant neoplasm of central portion of right breast in female, estrogen receptor positive ([place]) 02/12/2019  . Genetic testing 01/31/2019  . Malignant neoplasm of upper-inner quadrant of right breast in female, estrogen receptor positive ([place]) 01/15/2019  . Long term current use of opiate analgesic 12/02/2018  . Tympanic membrane central perforation, left 11/04/2018  . DDD (degenerative disc disease), lumbosacral 10/10/2018  . Abnormal MRI, lumbar spine (08/29/2018) 10/10/2018  . 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 Lumbar nerve root impingement (Bilateral: L4 & L5) (L>R: L5) 10/10/2018  . Lumbar Spinal instability of L4-5 10/10/2018  . Lumbar facet hypertrophy 10/10/2018  . COPD (chronic obstructive pulmonary disease) with  emphysema ([place]) 08/28/2018  . Elevated rheumatoid factor 08/13/2018  . Elevated C-reactive protein (CRP) 08/12/2018  . Elevated sed rate 08/12/2018  . Lumbar facet syndrome (Bilateral) 08/12/2018  . Back pain with left-sided radiculopathy 08/12/2018  . DDD (degenerative disc disease), lumbar 08/12/2018  . Chronic lower extremity pain (Primary Area of Pain) (Bilateral) (L>R) 07/23/2018  . Chronic low back pain (Secondary Area of Pain) (Bilateral) (L>R) w/ sciatica (Bilateral) 07/23/2018  . Chronic pain syndrome 07/23/2018  . Pharmacologic therapy 07/23/2018  . Disorder of skeletal system 07/23/2018  . Problems influencing health status 07/23/2018  . Chronic sacroiliac joint pain [facility] Area of Pain) (Bilateral) (L>R) 07/23/2018  . Status post lumboperitoneal shunt placement 02/26/2018  . 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 Tympanic membrane perforation 05/30/2016  . PMB (postmenopausal bleeding) 04/26/2016  . Rash 12/27/2015  . 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 Plantar fasciitis of foot (Left) 05/26/2015  . Spondylosis of lumbar region without myelopathy or radiculopathy 05/26/2015  . Lumbar foraminal stenosis (L3-4, L4-5, L5-S1) (L>R) 05/26/2015  . Spinal stenosis of lumbar region with neurogenic claudication 05/26/2015  . Encounter for screening mammogram for malignant neoplasm of breast 12/02/2014  . Goiter 12/02/2014  . Chronic idiopathic gout of foot (Right) 07/06/2014  . Biceps tendonitis (Right) 05/19/2014  . Shoulder pain (Right) 05/19/2014  . Facial weakness 05/05/2014  . Post herpetic neuralgia 05/05/2014  . Chronic dysfunction of left eustachian tube 03/24/2014  . Osteopenia 03/13/2014  . Lumbar Grade 1 Anterolisthesis of L3/4 (16m) & L4/5 (4-152m(w/ Dynamic instability) 03/13/2014  . Family history of colon cancer 12/02/2013  . Dyshidrotic eczema 11/17/2013  . Diastasis recti 04/08/2013   . Facial nerve paresis 07/31/2012  . Blepharospasm 05/08/2012  . 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 Obesity 03/28/2012  . Hemoptysis 12/22/2011  . Neuropathic postherpetic trigeminal neuralgia 12/12/2011  . Allergic rhinitis, mild 10/12/2011  . Disequilibrium syndrome 09/27/2011  . Chronic bronchitis ([facility]09/24/2012  . Essential hypertension 05/24/2011  . Type 2 diabetes mellitus without complications ([id number]. Pulmonary nodules 05/05/2011   ThAndrey SpearmanMS, OTR/L, CLT-LANA 05/08/19 12:33 PM'   [place]  [place] MAIN [facility] SERVICES [place], [place], [address] Phone: [phone]   Fax:  [phone]  Name: Susan Callahan MRN: [id number] Date of Birth: [birth year]

## 2019-05-08 NOTE — Therapy (Signed)
Susan Callahan [facility] SERVICES [address] [place], [place], [address] Phone: [phone]   Fax:  [phone]  Physical Therapy Treatment  Patient Details  Name: Susan Callahan MRN: [id number] Date of Birth: 09/13/1950 Referring Provider (PT): Francesco Sor   Encounter Date: 05/08/2019  PT End of Session - 05/08/19 1052    Visit Number  51    Number of Visits  44    Date for PT Re-Evaluation  06/03/19    Authorization Type  1/10 PN on 05/06/19    PT Start Time  1018    PT Stop Time  1059    PT Time Calculation (min)  41 min    Activity Tolerance  Patient tolerated treatment well    Behavior During Therapy  [facility] for tasks assessed/performed       Past Medical History:  Diagnosis Date  . COPD (chronic obstructive pulmonary disease) ([place])   . Depression 1974  . Diabetes mellitus without complication ([place])   . Hypertension   . Migraine   . 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 LUMBAR PERITONEAL SHUNT    . TYMPANOSTOMY TUBE PLACEMENT      There were no vitals filed for this visit.  Subjective Assessment - 05/08/19 1039    Subjective  Patient reports having reduced pain this session. Was running behind so forgot to wrap R hand for swelling.    Pertinent History  Pain began in 2007 and has gradually got worse and has progressed to nonstop. Patient states pain last all day long, everyday with no change in symptoms. Medication helps briefly. States she has difficulty with showering and standing. States she has to stand with her knee bent due to pulling sensation in legs. Pain begins immediately when stands and begins to walk. Pain feels better when she bends her knees when standing. Pain became constant and non stop around Dec 2018. Denies bowel or bladder changes, unexplained weakness, or unexpected weight loss/gain. She currently goes to pulmonary rehab 3x a week, but has increased  pain with treadmill walking. Will walk with cane on occasion around house especially at night due to occasional dizziness and vision impairments in medical history. States she also has difficulty with carrying groceries up steps due to low back pain. She would like to be able to walk better and have less pain to be less reliant on medication.     Limitations  Lifting;Standing;Walking;House hold activities    How long can you stand comfortably?  pain begins immediately     How long can you walk comfortably?  pain begins immediately     Patient Stated Goals  walk better, have less pain    Currently in Pain?  Yes    Pain Score  2     Pain Location  Back    Pain Orientation  Lower    Pain Descriptors / Indicators  Aching    Pain Type  Chronic pain    Pain Onset  More than a month ago         [place]  manual  Hamstring stretch in supine with increasing range 60 seconds each LE Piriformis stretch R and L 30 second holds  Roller to lumbar and thoracic paraspinals x 3 minutes.   SAD in figure 4 position with belt BLE 5x 20 seconds inferior direction Calf rollout with "the stick" 2 minutes each calf     Ther-ex:  cueing for sequencing, positioning and muscle activation  Hooklying HS stretch with DF/PF for sciatic nerve mobility x20 bilateral; Posterior pelvic tilts x15. tactile and verbal cue for appropriate technique 5 seconds with hold, improved alignment  posterior pelvic tilt with adduction squeeze in hooklying 15x 3 second holds  Posterior pelvic tilt with abduction GTB in hooklying 15x TrA activation GTB marches 12x each LE   swiss ball forward roll 10 x 3 second holds    Treadmill 2.0 mph, alternating between 1-2% incline for interval training 7 minutes. Cueing for abdominal activation and sequencing of muscle activation. Able to maintain heel strike and neutral spinal alignment with BUE support.    Pt educated throughout session about proper posture and technique with exercises.  Improved exercise technique, movement at target joints, use of target muscles after min to mod verbal, visual, tactile cues.                         PT Education - 05/08/19 1051    Education Details  exercise technique, ambulatory mechanics, posture    Person(s) Educated  Patient    Methods  Explanation;Demonstration;Tactile cues;Verbal cues    Comprehension  Verbalized understanding;Returned demonstration;Verbal cues required;Tactile cues required       PT Short Term Goals - 05/06/19 1738      PT SHORT TERM GOAL #1   Title  Patient will be independent with completion of HEP to improve ability to complete functional tasks and functional ADLs.    Baseline  10/23: will give HEP next session 11/19: HEP compliance    Time  2    Period  Weeks    Status  Achieved      PT SHORT TERM GOAL #2   Title  Patient will report pain score less than 4/10 on VAS to demonstrate reduction of pain levels with functional tasks.     Baseline  10/23: 6/10 11/19: 5/10 12/12: 9/10 1/10: 7/10  2/11: 3/10     Time  2    Period  Weeks    Status  Achieved        PT Long Term Goals - 05/06/19 1738      PT LONG TERM GOAL #1   Title  Patient will report pain score of <3/10 on VAS for decreased pain levels and ease with functional activities.    Baseline  10/23: 6/10 11/19: 5/10 12/12 9/10 1/10: 7/10 2/11: 3/10 3/9: 6/10  5/26: 8/10 03/17/19: 7/10 8/3: 5/10 8/25: 4/10    Time  4    Period  Weeks    Status  Partially Met    Target Date  06/03/19      PT LONG TERM GOAL #2   Title  Patient will be able to ambulate on treadmill for 55mnutes without increase in back/leg pain demonstrating improved community ambulation and treadmill walking at pulmonary rehab    Baseline  10/23: 023mutes increases pain 11/19: [id number] 12/12: has not had the chance to perform 1/10: was able to do 5 minutes without pain 2/11: able to do 5 minutes then had to bend over 3/9: 5 minutes 30 seconds at 2.0 mphs 5/26:  had to walk 10 minutes at cancer center with extreme pain but was able to perform 7/6: walks 10 minutes with increase of pain by 2 points 8/3: able to perform with bent over posture    Time  4    Period  Weeks    Status  Achieved  PT LONG TERM GOAL #3   Title  Patient will have decreased modified oswestry score <50% for improved ability to complete household tasks with less pain levels.     Baseline  10/23: 62% 11/19: 42%      Time  4    Period  Weeks    Status  Achieved      PT LONG TERM GOAL #4   Title  Patient will score 4+/5 on BLE MMT to demonstrate improved glute strength and ability to carry groceries with increased ease up/down stairs at home.    Baseline  10/23: 4-/5 grossly 11/19: 4/5 grossly; hamstrings 4-/5 12/12: 4/5 gross 1/10: 4/5 gross 2/11: gross 4+/5 with hamstrings and abuductors 4-/5 3/9: gross 4+/5 hamstrings and abductors 4-/5 5/27: grossly 3+/5 with pain with LLE movements 7/6: grossly 4-/5 with pain with LLE movement 8/3: deferred due to swelling/pain 8/25: grossly 4/5 without pain    Time  4    Period  Weeks    Status  Partially Met    Target Date  06/03/19      PT LONG TERM GOAL #5   Title  Patient will have decreased modified oswestry score <35% for improved ability to complete household tasks with less pain levels.     Baseline  11/19: 42% 12/12: 52% 1/10: 48%  2/11: 30%     Time  4    Period  Weeks    Status  Achieved      PT LONG TERM GOAL #6   Title  Patient will be able to make and/or strip the bed without pain and without needing rest breaks to perform iADLs independently.     Baseline  2/11: pain limits ability to perform, ~4 breaks required 3/9: takes her time, 3 breaks 5/27: very painful still 7/6: requires only one rest break 8/3: able to perform with +2 pain increase 8/25: no rest breaks required, increased pain 4 points    Time  4    Period  Weeks    Status  Partially Met    Target Date  06/03/19      PT LONG TERM GOAL #7   Title  Patient  will have decreased modified oswestry score <20% for improved ability to complete household tasks with less pain levels.     Baseline  2/11: 30%  3/9: 30% 5/27: 63% 7/6: 38% 8/3: 42% 8/25: 36%    Time  4    Period  Weeks    Status  Partially Met    Target Date  06/03/19      PT LONG TERM GOAL #8   Title  Patient will tolerate >10 minutes of upright postural positioning of trunk with active mobility with <2 degrees VAS increase.    Baseline  8/3: unable to tolerate 10  minutes 8/25: tolerates 5 minutes    Time  4    Period  Weeks    Status  Partially Met    Target Date  06/03/19            Plan - 05/08/19 1055    Clinical Impression Statement  Patient is progressing with functional mobility, able to tolerate an additional 2 minutes of interval training ambulation on treadmill with varying incline levels. Is having reduction of pain at this time with improved muscle tissue lengthening and abdominal activation. Patient will continue to benefit from skilled physical therapy to improve strength, to reduce pain levels with ADLs, and to improve quality of life.  Rehab Potential  Fair    Clinical Impairments Affecting Rehab Potential  (+) motivation (-) chronicity of symptoms    PT Frequency  2x / week    PT Duration  4 weeks    PT Treatment/Interventions  ADLs/Self Care Home Management;Cryotherapy;Moist Heat;Traction;Iontophoresis 33m/ml Dexamethasone;Ultrasound;Gait training;Stair training;Functional mobility training;Neuromuscular re-education;Balance training;Therapeutic exercise;Therapeutic activities;Patient/family education;Manual techniques;Energy conservation;Aquatic Therapy;Electrical Stimulation;Passive range of motion;Dry needling;Taping    PT Next Visit Plan  progress strengthening and postural mechanics    PT Home Exercise Plan  No updates this day    Consulted and Agree with Plan of Care  Patient       Patient will benefit from skilled therapeutic intervention in order to  improve the following deficits and impairments:  Abnormal gait, Cardiopulmonary status limiting activity, Decreased activity tolerance, Decreased balance, Decreased endurance, Decreased range of motion, Difficulty walking, Decreased strength, Hypomobility, Decreased mobility, Impaired perceived functional ability, Impaired flexibility, Postural dysfunction, Obesity, Improper body mechanics, Pain  Visit Diagnosis: Other abnormalities of gait and mobility  Chronic low back pain, unspecified back pain laterality, unspecified whether sciatica present  Abnormal posture  Muscle weakness (generalized)     Problem List Patient Active Problem List   Diagnosis Date Noted  . Edema 04/16/2019  . Postoperative wound infection 03/13/2019  . Seroma of breast 03/11/2019  . Age-related osteoporosis without current pathological fracture 02/27/2019  . Malignant neoplasm of central portion of right breast in female, estrogen receptor positive ([facility] [place] 02/12/2019  . Genetic testing 01/31/2019  . Malignant neoplasm of upper-inner quadrant of right breast in female, estrogen receptor positive ([place] 01/15/2019  . Long term current use of opiate analgesic 12/02/2018  . Tympanic membrane central perforation, left 11/04/2018  . DDD (degenerative disc disease), lumbosacral 10/10/2018  . Abnormal MRI, lumbar spine (08/29/2018) 10/10/2018  . 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 Lumbar nerve root impingement (Bilateral: L4 & L5) (L>R: L5) 10/10/2018  . Lumbar Spinal instability of L4-5 10/10/2018  . Lumbar facet hypertrophy 10/10/2018  . COPD (chronic obstructive pulmonary disease) with emphysema ([place] 08/28/2018  . Elevated rheumatoid factor 08/13/2018  . Elevated C-reactive protein (CRP) 08/12/2018  . Elevated sed rate 08/12/2018  . Lumbar facet syndrome (Bilateral) 08/12/2018  . Back pain with left-sided radiculopathy 08/12/2018  . DDD (degenerative disc  disease), lumbar 08/12/2018  . Chronic lower extremity pain (Primary Area of Pain) (Bilateral) (L>R) 07/23/2018  . Chronic low back pain (Secondary Area of Pain) (Bilateral) (L>R) w/ sciatica (Bilateral) 07/23/2018  . Chronic pain syndrome 07/23/2018  . Pharmacologic therapy 07/23/2018  . Disorder of skeletal system 07/23/2018  . Problems influencing health status 07/23/2018  . Chronic sacroiliac joint pain ([facility]) (Bilateral) (L>R) 07/23/2018  . Status post lumboperitoneal shunt placement 02/26/2018  . 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 Tympanic membrane perforation 05/30/2016  . PMB (postmenopausal bleeding) 04/26/2016  . Rash 12/27/2015  . 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 Plantar fasciitis of foot (Left) 05/26/2015  . Spondylosis of lumbar region without myelopathy or radiculopathy 05/26/2015  . Lumbar foraminal stenosis (L3-4, L4-5, L5-S1) (L>R) 05/26/2015  . Spinal stenosis of lumbar region with neurogenic claudication 05/26/2015  . Encounter for screening mammogram for malignant neoplasm of breast 12/02/2014  . Goiter 12/02/2014  . Chronic idiopathic gout of foot (Right) 07/06/2014  . Biceps tendonitis (Right) 05/19/2014  . Shoulder pain (Right) 05/19/2014  . Facial weakness 05/05/2014  . Post herpetic neuralgia  05/05/2014  . Chronic dysfunction of left eustachian tube 03/24/2014  . Osteopenia 03/13/2014  . Lumbar Grade 1 Anterolisthesis of L3/4 (17m) & L4/5 (4-168m(w/ Dynamic instability) 03/13/2014  . Family history of colon cancer 12/02/2013  . Dyshidrotic eczema 11/17/2013  . Diastasis recti 04/08/2013  . Facial nerve paresis 07/31/2012  . Blepharospasm 05/08/2012  . 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 Obesity 03/28/2012  . Hemoptysis 12/22/2011  . Neuropathic postherpetic trigeminal neuralgia 12/12/2011  . Allergic  rhinitis, mild 10/12/2011  . Disequilibrium syndrome 09/27/2011  . Chronic bronchitis ([facility] [place]09/24/2012  . Essential hypertension 05/24/2011  . Type 2 diabetes mellitus without complications ([id number]. Pulmonary nodules 05/05/2011   MaJanna ArchPT, DPT   05/08/2019, 11:03 AM  CoSchallerAIN REAg[facility] PartnershipERVICES [address]St[address] [id number]: [phone] Fax:  [phone]: LiJazyiah YiuRN: [id number] of Birth: [birth year]

## 2019-05-12 ENCOUNTER — Other Ambulatory Visit: Payer: Self-pay

## 2019-05-12 ENCOUNTER — Ambulatory Visit: Payer: Medicare Other | Admitting: Occupational Therapy

## 2019-05-12 DIAGNOSIS — I972 Postmastectomy lymphedema syndrome: Secondary | ICD-10-CM

## 2019-05-12 DIAGNOSIS — M545 Low back pain: Secondary | ICD-10-CM | POA: Diagnosis not present

## 2019-05-12 NOTE — Therapy (Signed)
[place] MAIN [facility] SERVICES [address] [place], [place], [address] Phone: [phone]   Fax:  [phone]  Occupational Therapy Treatment  Patient Details  Name: Susan Callahan MRN: [id number] Date of Birth: 03/19/1951 Referring Provider (OT): Humberto Leep, MD   Encounter Date: 05/12/2019  OT End of Session - 05/12/19 1435    Visit Number  23    Number of Visits  36    Date for OT Re-Evaluation  05/20/19    OT Start Time  0200    OT Stop Time  0310    OT Time Calculation (min)  70 min    Activity Tolerance  Patient tolerated treatment well;No increased pain    Behavior During Therapy  WFL for tasks assessed/performed       Past Medical History:  Diagnosis Date  . COPD (chronic obstructive pulmonary disease) ([place])   . Depression 1974  . Diabetes mellitus without complication ([place])   . Hypertension   . Migraine   . 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 LUMBAR PERITONEAL SHUNT    . TYMPANOSTOMY TUBE PLACEMENT      There were no vitals filed for this visit.  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 Pt presents with compression glove in place,. She did not reapply wraps after shower before appointment. Pt reports hand swelling seems about the same.    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 01/30/19 partial mastectomy. Margins clear. 0/2 LN  negative for  disease. actinic keritosis,DDD, DM, obesity, thyroid nodules, pulmonary nodules, chronic back pain, HTN, COPD, hx depression    Limitations  pain and swelling in R axilla and arm, chronic back pain, muscle weakness, impaired balance, decreased RUE AROM, abnormal gait, difficulty walking, impaired functional mobility and transfers,    Repetition  Increases  Symptoms    Special Tests  -Stemmer at MPs bilaterally    Patient Stated Goals  relduce pain and improve movement  in my R arm so I can do things I need to do. reduce risk of lymphedema    Pain Onset  More than a month ago    Pain Onset  More than a month ago                   OT Treatments/Exercises (OP) - 05/12/19 0001      ADLs   ADL Education Given  Yes      Manual Therapy   Manual Therapy  Edema management;Compression Bandaging;Manual Lymphatic Drainage (MLD)    Manual Lymphatic Drainage (MLD)  MLD to RUE/ RUQ  utilizing ipsilateral axillo-inguinal watershed, and anterior and posterior axillary anastamoses. Pt tolerated well in supine and sidelying. No myofacial release today.    Compression Bandaging  Omitted fniger wraps and OTS glove today in effort to reduce suspected tourniquet effect in wrista nd hand. Cont using established gradient technique but used guantlet style wrap on hand and only 2 short stretch wraps over double layer Rosidal foam on dorsal hand , and singole layer Rosidal on wrist to axilla..             OT Education - 05/12/19 1435    Education Details  Cont Pt edu for gradient compression wrapping RUE    Person(s) Educated  Patient    Methods  Demonstration;Explanation;Tactile cues;Verbal cues;Handout    Comprehension  Verbalized understanding;Returned demonstration          OT Long Term Goals - 05/05/19 1314      OT LONG TERM GOAL #1   Title  Pt independent with modified lymphedema precautions and prevention strategies to limit LE progression.    Baseline  Max A    Time  4    Period  Days    Status  Achieved      OT LONG TERM GOAL #2   Title  Pt will be independent with lymphedema self-care home program and AWS ther ex to improve functional performance of basic and instrumental ADLs by DC.    Baseline  Max A    Time  12    Period  Weeks    Status  Achieved      OT LONG TERM GOAL #3   Title  Pt wil report 0/10 pain with RUE  functional use ( reaching, lifting, carrying, dressing, dressing, etc) to achieve independent , pain free ADLs performance.    Baseline  Max A 05/05/19: Reporting 4/10 lymphadema pain now combined with AWS related pain.    Time  12    Period  Weeks    Status  Partially Met      OT LONG TERM GOAL #4   Title  Pt will be able to apply gradient compression wraps using correct techniques independently to control UE swelling and limit LE progression.    Baseline  Max A    Time  1    Period  Weeks    Status  New    Target Date  05/16/19            Plan - 05/12/19 1439    Clinical Impression Statement  Pt reports she had visit with DPM and was dx with plantar fasciitis. She heard from triage nurse for medical oncologist who instructed her not to resume let6razole yet as they were cvonsidering alternative medications. Pt agrees with my observation that onset of hand swelling was very sudden and preceeded sudden onset of distal BLE swelling by only a few days. Perhaps these symptoms of periferal swelling are medication related. Swelling in wrist and distal R hand appear unchanged today. Fingers present with deep , positive indentations from compression glove seams and wrist area presents with wrinkle in garment that may be limiting lymphatic flow proximally. Omitted glove, added dorsal foam layer and reduced wrap to 2 layers vs 3. Pt instrucrted to remove and re wrap should hand swelling appear worse. Pt tolerated MLD as established with minimal reductioon in hand swelling observed after manual treatment. Cont as per POC.    OT Occupational Profile and History  Comprehensive Assessment- Review of records and extensive additional review of physical, cognitive, psychosocial history related to current functional performance    Occupational performance deficits (Please refer to evaluation for details):  ADL's;IADL's;Social Participation;Leisure;Rest and Sleep;Work    Marketing executive / Function / Physical  Skills  ADL;Decreased knowledge of precautions;ROM;UE functional use;Decreased knowledge of use of DME;Edema;Skin integrity;Pain;IADL    Rehab Potential  Good    Clinical Decision Making  Several treatment options, min-mod task modification necessary    Comorbidities Affecting Occupational Performance:  Presence of comorbidities impacting occupational performance    Comorbidities impacting occupational performance description:  see SUBJUECTIVE    Modification or Assistance to Complete Evaluation   Min-Moderate modification of tasks or assist with assess necessary to  complete eval    OT Frequency  2x / week    OT Duration  12 weeks    OT Treatment/Interventions  Self-care/ADL training;Therapeutic exercise;Manual lymph drainage;Patient/family education;Therapeutic activities;DME and/or AE instruction;Manual Therapy;Other (comment)   consider fitting with prophylactic cmopression arm sleeve   Plan  BUE AROM ther ex to reduce axillary cording- 2 sets of 10 2-3 x daily    Consulted and Agree with Plan of Care  Patient       Patient will benefit from skilled therapeutic intervention in order to improve the following deficits and impairments:   Body Structure / Function / Physical Skills: ADL, Decreased knowledge of precautions, ROM, UE functional use, Decreased knowledge of use of DME, Edema, Skin integrity, Pain, IADL       Visit Diagnosis: Postmastectomy lymphedema syndrome    Problem List Patient Active Problem List   Diagnosis Date Noted  . Edema 04/16/2019  . Postoperative wound infection 03/13/2019  . Seroma of breast 03/11/2019  . Age-related osteoporosis without current pathological fracture 02/27/2019  . Malignant neoplasm of central portion of right breast in female, estrogen receptor positive ([place]) 02/12/2019  . Genetic testing 01/31/2019  . Malignant neoplasm of upper-inner quadrant of right breast in female, estrogen receptor positive ([place]) 01/15/2019  . Long term current  use of opiate analgesic 12/02/2018  . Tympanic membrane central perforation, left 11/04/2018  . DDD (degenerative disc disease), lumbosacral 10/10/2018  . Abnormal MRI, lumbar spine (08/29/2018) 10/10/2018  . 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 Lumbar nerve root impingement (Bilateral: L4 & L5) (L>R: L5) 10/10/2018  . Lumbar Spinal instability of L4-5 10/10/2018  . Lumbar facet hypertrophy 10/10/2018  . COPD (chronic obstructive pulmonary disease) with emphysema ([place]) 08/28/2018  . Elevated rheumatoid factor 08/13/2018  . Elevated C-reactive protein (CRP) 08/12/2018  . Elevated sed rate 08/12/2018  . Lumbar facet syndrome (Bilateral) 08/12/2018  . Back pain with left-sided radiculopathy 08/12/2018  . DDD (degenerative disc disease), lumbar 08/12/2018  . Chronic lower extremity pain (Primary Area of Pain) (Bilateral) (L>R) 07/23/2018  . Chronic low back pain (Secondary Area of Pain) (Bilateral) (L>R) w/ sciatica (Bilateral) 07/23/2018  . Chronic pain syndrome 07/23/2018  . Pharmacologic therapy 07/23/2018  . Disorder of skeletal system 07/23/2018  . Problems influencing health status 07/23/2018  . Chronic sacroiliac joint pain [facility] Area of Pain) (Bilateral) (L>R) 07/23/2018  . Status post lumboperitoneal shunt placement 02/26/2018  . 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 Tympanic membrane perforation 05/30/2016  . PMB (postmenopausal bleeding) 04/26/2016  . Rash 12/27/2015  . 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 Plantar fasciitis of foot (Left) 05/26/2015  . Spondylosis of lumbar region without myelopathy or radiculopathy 05/26/2015  . Lumbar foraminal stenosis (L3-4, L4-5, L5-S1) (L>R) 05/26/2015  . Spinal stenosis of lumbar region with neurogenic claudication 05/26/2015  . Encounter for screening mammogram for malignant neoplasm of breast 12/02/2014  .  Goiter 12/02/2014  . Chronic idiopathic gout of foot (Right) 07/06/2014  . Biceps tendonitis (Right) 05/19/2014  . Shoulder pain (Right) 05/19/2014  . Facial weakness 05/05/2014  . Post herpetic neuralgia 05/05/2014  . Chronic dysfunction of left eustachian tube 03/24/2014  . Osteopenia 03/13/2014  . Lumbar Grade 1 Anterolisthesis of L3/4 (70m) & L4/5 (4-131m(w/ Dynamic instability) 03/13/2014  . Family history of colon cancer 12/02/2013  . Dyshidrotic eczema 11/17/2013  . Diastasis recti 04/08/2013  . Facial nerve paresis 07/31/2012  .  Blepharospasm 05/08/2012  . 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 Obesity 03/28/2012  . Hemoptysis 12/22/2011  . Neuropathic postherpetic trigeminal neuralgia 12/12/2011  . Allergic rhinitis, mild 10/12/2011  . Disequilibrium syndrome 09/27/2011  . Chronic bronchitis ([place]) 06/05/2011  . Essential hypertension 05/24/2011  . Type 2 diabetes mellitus without complications ([place]) 41/66/0630  . Pulmonary nodules 05/05/2011    Andrey Spearman, MS, OTR/L, [facility] 05/12/19 2:46 PM  [place] MAIN [facility] SERVICES [address] [place], [place], [address] Phone: [phone]   Fax:  [phone]  Name: Nadia Viar MRN: [id number] Date of Birth: May 16, 1951

## 2019-05-13 ENCOUNTER — Ambulatory Visit: Payer: Medicare Other

## 2019-05-13 ENCOUNTER — Other Ambulatory Visit: Payer: Self-pay

## 2019-05-13 ENCOUNTER — Ambulatory Visit: Payer: Medicare Other | Attending: Rehabilitation | Admitting: Occupational Therapy

## 2019-05-13 DIAGNOSIS — R2689 Other abnormalities of gait and mobility: Secondary | ICD-10-CM

## 2019-05-13 DIAGNOSIS — M6281 Muscle weakness (generalized): Secondary | ICD-10-CM | POA: Diagnosis present

## 2019-05-13 DIAGNOSIS — G8929 Other chronic pain: Secondary | ICD-10-CM | POA: Diagnosis present

## 2019-05-13 DIAGNOSIS — R293 Abnormal posture: Secondary | ICD-10-CM | POA: Insufficient documentation

## 2019-05-13 DIAGNOSIS — I972 Postmastectomy lymphedema syndrome: Secondary | ICD-10-CM

## 2019-05-13 DIAGNOSIS — M545 Low back pain: Secondary | ICD-10-CM | POA: Diagnosis present

## 2019-05-13 NOTE — Therapy (Signed)
[place] MAIN [facility] SERVICES [address] [place], [place], [address] Phone: [phone]   Fax:  [phone]  Occupational Therapy Treatment  Patient Details  Name: Susan Callahan MRN: [id number] Date of Birth: Jan 05, 1951 Referring Provider (OT): Humberto Leep, MD   Encounter Date: 05/13/2019  OT End of Session - 05/13/19 1645    Visit Number  24    Number of Visits  36    Date for OT Re-Evaluation  05/20/19    OT Start Time  0205    OT Stop Time  0305    OT Time Calculation (min)  60 min    Activity Tolerance  Patient tolerated treatment well;No increased pain    Behavior During Therapy  WFL for tasks assessed/performed       Past Medical History:  Diagnosis Date  . COPD (chronic obstructive pulmonary disease) ([place])   . Depression 1974  . Diabetes mellitus without complication ([place])   . Hypertension   . Migraine   . 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 LUMBAR PERITONEAL SHUNT    . TYMPANOSTOMY TUBE PLACEMENT      There were no vitals filed for this visit.  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 01/30/19 partial mastectomy. Margins clear. 0/2 LN  negative for  disease. actinic keritosis,DDD, DM, obesity, thyroid nodules, pulmonary nodules, chronic back pain, HTN, COPD, hx depression    Limitations  pain and swelling in R axilla and arm, chronic back pain, muscle weakness, impaired balance, decreased RUE AROM, abnormal gait, difficulty walking, impaired functional mobility and transfers,    Repetition  Increases Symptoms     Special Tests  -Stemmer at MPs bilaterally    Patient Stated Goals  relduce pain and improve movement  in my R arm so I can do things I need to do. reduce risk of lymphedema    Currently in Pain?  No/denies    Pain Onset  More than a month ago    Pain Onset  More than a month ago                   OT Treatments/Exercises (OP) - 05/13/19 0001      ADLs   ADL Education Given  Yes      Manual Therapy   Manual Therapy  Edema management    Manual Lymphatic Drainage (MLD)  MLD to RUE/ RUQ  utilizing ipsilateral axillo-inguinal watershed, and anterior and posterior axillary anastamoses. Pt tolerated well in supine and sidelying. No myofacial release today.    Compression Bandaging  Resumed  finger wraps using cowrap under short stretch  arm wraps as established. Used  dorsal hand pad.             OT Education - 05/13/19 1313    Education Details  Continued skilled Pt/caregiver education  And LE ADL training throughout visit for lymphedema self care/ home program, including compression wrapping, compression garment and device wear/care, lymphatic pumping ther ex, simple self-MLD, and skin care. Discussed progress towards goals.  Person(s) Educated  Patient    Methods  Demonstration;Explanation;Tactile cues;Verbal cues;Handout    Comprehension  Verbalized understanding;Returned demonstration          OT Long Term Goals - 05/05/19 1314      OT LONG TERM GOAL #1   Title  Pt independent with modified lymphedema precautions and prevention strategies to limit LE progression.    Baseline  Max A    Time  4    Period  Days    Status  Achieved      OT LONG TERM GOAL #2   Title  Pt will be independent with lymphedema self-care home program and AWS ther ex to improve functional performance of basic and instrumental ADLs by DC.    Baseline  Max A    Time  12    Period  Weeks    Status  Achieved      OT LONG TERM GOAL #3   Title  Pt wil report 0/10 pain with RUE  functional use ( reaching, lifting, carrying, dressing, dressing, etc) to achieve independent , pain free ADLs performance.    Baseline  Max A 05/05/19: Reporting 4/10 lymphadema pain now combined with AWS related pain.    Time  12    Period  Weeks    Status  Partially Met      OT LONG TERM GOAL #4   Title  Pt will be able to apply gradient compression wraps using correct techniques independently to control UE swelling and limit LE progression.    Baseline  Max A    Time  1    Period  Weeks    Status  New    Target Date  05/16/19            Plan - 05/13/19 1645    Clinical Impression Statement  Stubborn swelling in L hand persists, but arm swelling is minimal. Pt toleratmng treatment without difficulty. Cording is not limiting functional arm use at present, but Pt reports she continues to have mild discomfort  related to it at end ranges. Confirmn==med today with DME vendor that Pt is ready to proceed with custom compression garment order. Cont as per POC.    OT Occupational Profile and History  Comprehensive Assessment- Review of records and extensive additional review of physical, cognitive, psychosocial history related to current functional performance    Occupational performance deficits (Please refer to evaluation for details):  ADL's;IADL's;Social Participation;Leisure;Rest and Sleep;Work    Marketing executive / Function / Physical Skills  ADL;Decreased knowledge of precautions;ROM;UE functional use;Decreased knowledge of use of DME;Edema;Skin integrity;Pain;IADL    Rehab Potential  Good    Clinical Decision Making  Several treatment options, min-mod task modification necessary    Comorbidities Affecting Occupational Performance:  Presence of comorbidities impacting occupational performance    Comorbidities impacting occupational performance description:  see SUBJUECTIVE    Modification or Assistance to Complete Evaluation   Min-Moderate modification of tasks or assist with assess  necessary to complete eval    OT Frequency  2x / week    OT Duration  12 weeks    OT Treatment/Interventions  Self-care/ADL training;Therapeutic exercise;Manual lymph drainage;Patient/family education;Therapeutic activities;DME and/or AE instruction;Manual Therapy;Other (comment)   consider fitting with prophylactic cmopression arm sleeve   Plan  BUE AROM ther ex to reduce axillary cording- 2 sets of 10 2-3 x daily    Consulted and Agree with Plan of Care  Patient       Patient will benefit from  skilled therapeutic intervention in order to improve the following deficits and impairments:   Body Structure / Function / Physical Skills: ADL, Decreased knowledge of precautions, ROM, UE functional use, Decreased knowledge of use of DME, Edema, Skin integrity, Pain, IADL       Visit Diagnosis: Postmastectomy lymphedema syndrome    Problem List Patient Active Problem List   Diagnosis Date Noted  . Edema 04/16/2019  . Postoperative wound infection 03/13/2019  . Seroma of breast 03/11/2019  . Age-related osteoporosis without current pathological fracture 02/27/2019  . Malignant neoplasm of central portion of right breast in female, estrogen receptor positive ([place]) 02/12/2019  . Genetic testing 01/31/2019  . Malignant neoplasm of upper-inner quadrant of right breast in female, estrogen receptor positive ([place]) 01/15/2019  . Long term current use of opiate analgesic 12/02/2018  . Tympanic membrane central perforation, left 11/04/2018  . DDD (degenerative disc disease), lumbosacral 10/10/2018  . Abnormal MRI, lumbar spine (08/29/2018) 10/10/2018  . 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 Lumbar nerve root impingement (Bilateral: L4 & L5) (L>R: L5) 10/10/2018  . Lumbar Spinal instability of L4-5 10/10/2018  . Lumbar facet hypertrophy 10/10/2018  . COPD (chronic obstructive pulmonary disease) with emphysema ([place]) 08/28/2018  . Elevated rheumatoid  factor 08/13/2018  . Elevated C-reactive protein (CRP) 08/12/2018  . Elevated sed rate 08/12/2018  . Lumbar facet syndrome (Bilateral) 08/12/2018  . Back pain with left-sided radiculopathy 08/12/2018  . DDD (degenerative disc disease), lumbar 08/12/2018  . Chronic lower extremity pain (Primary Area of Pain) (Bilateral) (L>R) 07/23/2018  . Chronic low back pain (Secondary Area of Pain) (Bilateral) (L>R) w/ sciatica (Bilateral) 07/23/2018  . Chronic pain syndrome 07/23/2018  . Pharmacologic therapy 07/23/2018  . Disorder of skeletal system 07/23/2018  . Problems influencing health status 07/23/2018  . Chronic sacroiliac joint pain [facility] Area of Pain) (Bilateral) (L>R) 07/23/2018  . Status post lumboperitoneal shunt placement 02/26/2018  . 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 Tympanic membrane perforation 05/30/2016  . PMB (postmenopausal bleeding) 04/26/2016  . Rash 12/27/2015  . 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 Plantar fasciitis of foot (Left) 05/26/2015  . Spondylosis of lumbar region without myelopathy or radiculopathy 05/26/2015  . Lumbar foraminal stenosis (L3-4, L4-5, L5-S1) (L>R) 05/26/2015  . Spinal stenosis of lumbar region with neurogenic claudication 05/26/2015  . Encounter for screening mammogram for malignant neoplasm of breast 12/02/2014  . Goiter 12/02/2014  . Chronic idiopathic gout of foot (Right) 07/06/2014  . Biceps tendonitis (Right) 05/19/2014  . Shoulder pain (Right) 05/19/2014  . Facial weakness 05/05/2014  . Post herpetic neuralgia 05/05/2014  . Chronic dysfunction of left eustachian tube 03/24/2014  . Osteopenia 03/13/2014  . Lumbar Grade 1 Anterolisthesis of L3/4 (71m) & L4/5 (4-138m(w/ Dynamic instability) 03/13/2014  . Family history of colon cancer 12/02/2013  . Dyshidrotic eczema 11/17/2013  . Diastasis recti 04/08/2013  . Facial nerve paresis 07/31/2012  .  Blepharospasm 05/08/2012  . 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 Obesity 03/28/2012  . Hemoptysis 12/22/2011  . Neuropathic postherpetic trigeminal neuralgia 12/12/2011  . Allergic rhinitis, mild 10/12/2011  . Disequilibrium syndrome 09/27/2011  . Chronic bronchitis ([place]09/24/2012  . Essential hypertension 05/24/2011  . Type 2 diabetes mellitus without complications ([id number]. Pulmonary nodules 05/05/2011    ThAndrey SpearmanMS, OTR/L, CLT-LANA 05/13/19 4:49 PM  [place], [place], [address] Phone: [phone]   Fax:  [phone]  Name: Susan Callahan MRN: [id number] Date of Birth: April 20, 1951

## 2019-05-13 NOTE — Therapy (Signed)
[place] [facility] [address] [place], [place], [address] Phone: [phone]   Fax:  [phone]  Physical Therapy Treatment  Patient Details  Name: Susan Callahan MRN: [id number] Date of Birth: 10-Aug-1951 Referring Provider (PT): Francesco Sor   Encounter Date: 05/13/2019  PT End of Session - 05/13/19 1412    Visit Number  20    Number of Visits  35    Date for PT Re-Evaluation  06/03/19    Authorization Type  2/10 PN on 05/06/19    PT Start Time  1411    PT Stop Time  1445    PT Time Calculation (min)  34 min    Activity Tolerance  Patient tolerated treatment well    Behavior During Therapy  [facility] for tasks assessed/performed       Past Medical History:  Diagnosis Date  . COPD (chronic obstructive pulmonary disease) ([place])   . Depression 1974  . Diabetes mellitus without complication ([place])   . Hypertension   . Migraine   . 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 LUMBAR PERITONEAL SHUNT    . TYMPANOSTOMY TUBE PLACEMENT      There were no vitals filed for this visit.  Subjective Assessment - 05/13/19 1432    Subjective  Patient stayed late in lymphedema treatment limiting session duration.  Recieved an injection in her foot for her plantar fasciatis since last session. Is having more pain and stiffness today.    Pertinent History  Pain began in 2007 and has gradually got worse and has progressed to nonstop. Patient states pain last all day long, everyday with no change in symptoms. Medication helps briefly. States she has difficulty with showering and standing. States she has to stand with her knee bent due to pulling sensation in legs. Pain begins immediately when stands and begins to walk. Pain feels better when she bends her knees when standing. Pain became constant and non stop around Dec 2018. Denies bowel or bladder changes, unexplained weakness, or unexpected  weight loss/gain. She currently goes to pulmonary rehab 3x a week, but has increased pain with treadmill walking. Will walk with cane on occasion around house especially at night due to occasional dizziness and vision impairments in medical history. States she also has difficulty with carrying groceries up steps due to low back pain. She would like to be able to walk better and have less pain to be less reliant on medication.     Limitations  Lifting;Standing;Walking;House hold activities    How long can you stand comfortably?  pain begins immediately     How long can you walk comfortably?  pain begins immediately     Patient Stated Goals  walk better, have less pain    Currently in Pain?  Yes    Pain Score  3     Pain Location  Back    Pain Orientation  Lower    Pain Descriptors / Indicators  Aching    Pain Type  Chronic pain    Pain Onset  More than a month ago    Pain Frequency  Intermittent    Pain Onset  More than a month ago      Patient stayed late in lymphedema treatment limiting session duration.       manual  Piriformis stretch R and L 30 second holds  Roller to lumbar and thoracic paraspinals x 3 minutes.  SAD in figure 4 position with belt BLE 5x 20 seconds inferior direction Calf rollout with "the stick" 2 minutes each calf     Ther-ex: cueing for sequencing, positioning and muscle activation  Hooklying HS stretch with DF/PF for sciatic nerve mobility x20 bilateral; Posterior pelvic tilts x15. tactile and verbal cue for appropriate technique 5 seconds with hold, improved alignment  posterior pelvic tilt with adduction squeeze in hooklying 15x 3 second holds  Posterior pelvic tilt with abduction GTB in hooklying 15x TrA activation with marches 12x each LE    swiss ball forward roll 10 x 3 second holds      pain reduced to 1/10 by end of session   Pt educated throughout session about proper posture and technique with exercises. Improved exercise technique, movement  at target joints, use of target muscles after min to mod verbal, visual, tactile cues.                        PT Education - 05/13/19 1431    Education Details  exercise technique, body mechanics    Person(s) Educated  Patient    Methods  Explanation;Demonstration;Tactile cues;Verbal cues    Comprehension  Verbalized understanding;Returned demonstration;Verbal cues required;Tactile cues required       PT Short Term Goals - 05/06/19 1738      PT SHORT TERM GOAL #1   Title  Patient will be independent with completion of HEP to improve ability to complete functional tasks and functional ADLs.    Baseline  10/23: will give HEP next session 11/19: HEP compliance    Time  2    Period  Weeks    Status  Achieved      PT SHORT TERM GOAL #2   Title  Patient will report pain score less than 4/10 on VAS to demonstrate reduction of pain levels with functional tasks.     Baseline  10/23: 6/10 11/19: 5/10 12/12: 9/10 1/10: 7/10  2/11: 3/10     Time  2    Period  Weeks    Status  Achieved        PT Long Term Goals - 05/06/19 1738      PT LONG TERM GOAL #1   Title  Patient will report pain score of <3/10 on VAS for decreased pain levels and ease with functional activities.    Baseline  10/23: 6/10 11/19: 5/10 12/12 9/10 1/10: 7/10 2/11: 3/10 3/9: 6/10  5/26: 8/10 03/17/19: 7/10 8/3: 5/10 8/25: 4/10    Time  4    Period  Weeks    Status  Partially Met    Target Date  06/03/19      PT LONG TERM GOAL #2   Title  Patient will be able to ambulate on treadmill for 1mnutes without increase in back/leg pain demonstrating improved community ambulation and treadmill walking at pulmonary rehab    Baseline  10/23: 037mutes increases pain 11/19: 12m44mtes 12/12: has not had the chance to perform 1/10: was able to do 5 minutes without pain 2/11: able to do 5 minutes then had to bend over 3/9: 5 minutes 30 seconds at 2.0 mphs 5/26: had to walk 10 minutes at cancer center with extreme  pain but was able to perform 7/6: walks 10 minutes with increase of pain by 2 points 8/3: able to perform with bent over posture    Time  4    Period  Weeks    Status  Achieved      PT LONG TERM GOAL #3   Title  Patient will have decreased modified oswestry score <50% for improved ability to complete household tasks with less pain levels.     Baseline  10/23: 62% 11/19: 42%      Time  4    Period  Weeks    Status  Achieved      PT LONG TERM GOAL #4   Title  Patient will score 4+/5 on BLE MMT to demonstrate improved glute strength and ability to carry groceries with increased ease up/down stairs at home.    Baseline  10/23: 4-/5 grossly 11/19: 4/5 grossly; hamstrings 4-/5 12/12: 4/5 gross 1/10: 4/5 gross 2/11: gross 4+/5 with hamstrings and abuductors 4-/5 3/9: gross 4+/5 hamstrings and abductors 4-/5 5/27: grossly 3+/5 with pain with LLE movements 7/6: grossly 4-/5 with pain with LLE movement 8/3: deferred due to swelling/pain 8/25: grossly 4/5 without pain    Time  4    Period  Weeks    Status  Partially Met    Target Date  06/03/19      PT LONG TERM GOAL #5   Title  Patient will have decreased modified oswestry score <35% for improved ability to complete household tasks with less pain levels.     Baseline  11/19: 42% 12/12: 52% 1/10: 48%  2/11: 30%     Time  4    Period  Weeks    Status  Achieved      PT LONG TERM GOAL #6   Title  Patient will be able to make and/or strip the bed without pain and without needing rest breaks to perform iADLs independently.     Baseline  2/11: pain limits ability to perform, ~4 breaks required 3/9: takes her time, 3 breaks 5/27: very painful still 7/6: requires only one rest break 8/3: able to perform with +2 pain increase 8/25: no rest breaks required, increased pain 4 points    Time  4    Period  Weeks    Status  Partially Met    Target Date  06/03/19      PT LONG TERM GOAL #7   Title  Patient will have decreased modified oswestry score <20% for  improved ability to complete household tasks with less pain levels.     Baseline  2/11: 30%  3/9: 30% 5/27: 63% 7/6: 38% 8/3: 42% 8/25: 36%    Time  4    Period  Weeks    Status  Partially Met    Target Date  06/03/19      PT LONG TERM GOAL #8   Title  Patient will tolerate >10 minutes of upright postural positioning of trunk with active mobility with <2 degrees VAS increase.    Baseline  8/3: unable to tolerate 10  minutes 8/25: tolerates 5 minutes    Time  4    Period  Weeks    Status  Partially Met    Target Date  06/03/19            Plan - 05/13/19 1435    Clinical Impression Statement  Patient's lumbar spine is more hypomobile with increased muscle tension of paraspinals noted. Patient received an injection in her heel for her plantar fasciitis since last session. Increased edema noted throughout entire body resulting in abnormal muscle tissue response. Patient will continue to benefit from skilled physical therapy to improve strength, to reduce pain levels with ADLs, and to improve  quality of life.    Rehab Potential  Fair    Clinical Impairments Affecting Rehab Potential  (+) motivation (-) chronicity of symptoms    PT Frequency  2x / week    PT Duration  4 weeks    PT Treatment/Interventions  ADLs/Self Care Home Management;Cryotherapy;Moist Heat;Traction;Iontophoresis 82m/ml Dexamethasone;Ultrasound;Gait training;Stair training;Functional mobility training;Neuromuscular re-education;Balance training;Therapeutic exercise;Therapeutic activities;Patient/family education;Manual techniques;Energy conservation;Aquatic Therapy;Electrical Stimulation;Passive range of motion;Dry needling;Taping    PT Next Visit Plan  progress strengthening and postural mechanics    PT Home Exercise Plan  No updates this day    Consulted and Agree with Plan of Care  Patient       Patient will benefit from skilled therapeutic intervention in order to improve the following deficits and impairments:   Abnormal gait, Cardiopulmonary status limiting activity, Decreased activity tolerance, Decreased balance, Decreased endurance, Decreased range of motion, Difficulty walking, Decreased strength, Hypomobility, Decreased mobility, Impaired perceived functional ability, Impaired flexibility, Postural dysfunction, Obesity, Improper body mechanics, Pain  Visit Diagnosis: Other abnormalities of gait and mobility  Chronic low back pain, unspecified back pain laterality, unspecified whether sciatica present  Abnormal posture  Muscle weakness (generalized)     Problem List Patient Active Problem List   Diagnosis Date Noted  . Edema 04/16/2019  . Postoperative wound infection 03/13/2019  . Seroma of breast 03/11/2019  . Age-related osteoporosis without current pathological fracture 02/27/2019  . Malignant neoplasm of central portion of right breast in female, estrogen receptor positive ([facility] [place] 02/12/2019  . Genetic testing 01/31/2019  . Malignant neoplasm of upper-inner quadrant of right breast in female, estrogen receptor positive ([place] 01/15/2019  . Long term current use of opiate analgesic 12/02/2018  . Tympanic membrane central perforation, left 11/04/2018  . DDD (degenerative disc disease), lumbosacral 10/10/2018  . Abnormal MRI, lumbar spine (08/29/2018) 10/10/2018  . 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 Lumbar nerve root impingement (Bilateral: L4 & L5) (L>R: L5) 10/10/2018  . Lumbar Spinal instability of L4-5 10/10/2018  . Lumbar facet hypertrophy 10/10/2018  . COPD (chronic obstructive pulmonary disease) with emphysema ([place] 08/28/2018  . Elevated rheumatoid factor 08/13/2018  . Elevated C-reactive protein (CRP) 08/12/2018  . Elevated sed rate 08/12/2018  . Lumbar facet syndrome (Bilateral) 08/12/2018  . Back pain with left-sided radiculopathy 08/12/2018  . DDD (degenerative disc disease), lumbar 08/12/2018  . Chronic lower extremity  pain (Primary Area of Pain) (Bilateral) (L>R) 07/23/2018  . Chronic low back pain (Secondary Area of Pain) (Bilateral) (L>R) w/ sciatica (Bilateral) 07/23/2018  . Chronic pain syndrome 07/23/2018  . Pharmacologic therapy 07/23/2018  . Disorder of skeletal system 07/23/2018  . Problems influencing health status 07/23/2018  . [facility]iliac [facility] (Bilateral) (L>R) 07/23/2018  . Status post lumboperitoneal shunt placement 02/26/2018  . 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 Tympanic membrane perforation 05/30/2016  . PMB (postmenopausal bleeding) 04/26/2016  . Rash 12/27/2015  . 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 Plantar fasciitis of foot (Left) 05/26/2015  . Spondylosis of lumbar region without myelopathy or radiculopathy 05/26/2015  . Lumbar foraminal stenosis (L3-4, L4-5, L5-S1) (L>R) 05/26/2015  . Spinal stenosis of lumbar region with neurogenic claudication 05/26/2015  . Encounter for screening mammogram for malignant neoplasm of breast 12/02/2014  . Goiter 12/02/2014  . Chronic idiopathic gout of foot (Right) 07/06/2014  . Biceps tendonitis (Right) 05/19/2014  . Shoulder pain (Right) 05/19/2014  . Facial weakness  05/05/2014  . Post herpetic neuralgia 05/05/2014  . Chronic dysfunction of left eustachian tube 03/24/2014  . Osteopenia 03/13/2014  . Lumbar Grade 1 Anterolisthesis of L3/4 (56m) & L4/5 (4-116m(w/ Dynamic instability) 03/13/2014  . Family history of colon cancer 12/02/2013  . Dyshidrotic eczema 11/17/2013  . Diastasis recti 04/08/2013  . Facial nerve paresis 07/31/2012  . Blepharospasm 05/08/2012  . 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 Obesity 03/28/2012  . Hemoptysis 12/22/2011  . Neuropathic postherpetic trigeminal neuralgia 12/12/2011  . Allergic rhinitis, mild 10/12/2011  . Disequilibrium syndrome  09/27/2011  . Chronic bronchitis ([facility]09/24/2012  . Essential hypertension 05/24/2011  . Type 2 diabetes mellitus without complications ([id number]. Pulmonary nodules 05/05/2011     MaJanna ArchPT, DPT   05/13/2019, 2:45 PM  CoSt. ElmoAIN RENew [facility] HospitalERV[facility] [id number]: [phone] Fax:  [phone]: LiLamira BorinRN: [id number] of Birth: 6/26-Jan-1951

## 2019-05-15 ENCOUNTER — Encounter: Payer: Medicare Other | Admitting: Occupational Therapy

## 2019-05-21 ENCOUNTER — Other Ambulatory Visit: Payer: Self-pay

## 2019-05-21 ENCOUNTER — Ambulatory Visit: Payer: Medicare Other

## 2019-05-21 ENCOUNTER — Ambulatory Visit: Payer: Medicare Other | Admitting: Occupational Therapy

## 2019-05-21 DIAGNOSIS — R2689 Other abnormalities of gait and mobility: Secondary | ICD-10-CM

## 2019-05-21 DIAGNOSIS — R293 Abnormal posture: Secondary | ICD-10-CM

## 2019-05-21 DIAGNOSIS — M545 Low back pain, unspecified: Secondary | ICD-10-CM

## 2019-05-21 DIAGNOSIS — I972 Postmastectomy lymphedema syndrome: Secondary | ICD-10-CM

## 2019-05-21 DIAGNOSIS — M6281 Muscle weakness (generalized): Secondary | ICD-10-CM

## 2019-05-21 DIAGNOSIS — G8929 Other chronic pain: Secondary | ICD-10-CM

## 2019-05-21 NOTE — Therapy (Signed)
[facility] SERVICES [address] [place], [place], [address] Phone: [phone]   Fax:  [phone]  Physical Therapy Treatment  Patient Details  Name: Susan Callahan MRN: [id number] Date of Birth: 18-Jun-1951 Referring Provider (PT): Francesco Sor   Encounter Date: 05/21/2019  PT End of Session - 05/21/19 1433    Visit Number  21    Number of Visits  56    Date for PT Re-Evaluation  06/03/19    Authorization Type  3/10 PN on 05/06/19    PT Start Time  1300    PT Stop Time  1344    PT Time Calculation (min)  44 min    Equipment Utilized During Treatment  Gait belt    Activity Tolerance  Patient tolerated treatment well    Behavior During Therapy  [facility] for tasks assessed/performed       Past Medical History:  Diagnosis Date  . COPD (chronic obstructive pulmonary disease) ([place])   . Depression 1974  . Diabetes mellitus without complication ([place])   . Hypertension   . Migraine   . 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 LUMBAR PERITONEAL SHUNT    . TYMPANOSTOMY TUBE PLACEMENT      There were no vitals filed for this visit.  Subjective Assessment - 05/21/19 1431    Subjective  Patient had osteoclast injection last week resulting in bed rest for 3-4 days due to reaction to procedure. Is feeling better now    Pertinent History  Pain began in 2007 and has gradually got worse and has progressed to nonstop. Patient states pain last all day long, everyday with no change in symptoms. Medication helps briefly. States she has difficulty with showering and standing. States she has to stand with her knee bent due to pulling sensation in legs. Pain begins immediately when stands and begins to walk. Pain feels better when she bends her knees when standing. Pain became constant and non stop around Dec 2018. Denies bowel or bladder changes, unexplained weakness, or unexpected weight  loss/gain. She currently goes to pulmonary rehab 3x a week, but has increased pain with treadmill walking. Will walk with cane on occasion around house especially at night due to occasional dizziness and vision impairments in medical history. States she also has difficulty with carrying groceries up steps due to low back pain. She would like to be able to walk better and have less pain to be less reliant on medication.     Limitations  Lifting;Standing;Walking;House hold activities    How long can you stand comfortably?  pain begins immediately     How long can you walk comfortably?  pain begins immediately     Patient Stated Goals  walk better, have less pain    Currently in Pain?  Yes    Pain Score  2     Pain Location  Back    Pain Orientation  Lower    Pain Descriptors / Indicators  Aching    Pain Type  Chronic pain    Pain Onset  More than a month ago    Pain Frequency  Intermittent         Patient had osteoclast injection last week resulting in bed rest for 3-4 days due to reaction to procedure. Is feeling better now   manual  Hamstring stretch in supine with increasing range 60 seconds each LE Piriformis stretch R and  L 30 second holds x2 trials Roller to lumbar and thoracic paraspinals x 3 minutes.  Calf rollout with "the stick" 2 minutes each calf     Ther-ex: cueing for sequencing, positioning and muscle activation   Hooklying HS stretch with DF/PF for sciatic nerve mobility x20 bilateral; Posterior pelvic tilts x15. tactile and verbal cue for appropriate technique 5 seconds with hold, improved alignment Standing calf stretch on treadmill 30 seconds x 2 trials each LE Self rollout education for reduction of knots in calf musculature.   Treadmill 2.0 mph, alternating between 1-2% incline for interval training 7 minutes. Cueing for abdominal activation and sequencing of muscle activation. Able to maintain heel strike and neutral spinal alignment with BUE support.     Pt  educated throughout session about proper posture and technique with exercises. Improved exercise technique, movement at target joints, use of target muscles after min to mod verbal, visual, tactile cues.                        PT Education - 05/21/19 1431    Education Details  exercise technique, body mechanics    Person(s) Educated  Patient    Methods  Explanation;Demonstration;Tactile cues;Verbal cues    Comprehension  Verbalized understanding;Returned demonstration;Verbal cues required;Tactile cues required       PT Short Term Goals - 05/06/19 1738      PT SHORT TERM GOAL #1   Title  Patient will be independent with completion of HEP to improve ability to complete functional tasks and functional ADLs.    Baseline  10/23: will give HEP next session 11/19: HEP compliance    Time  2    Period  Weeks    Status  Achieved      PT SHORT TERM GOAL #2   Title  Patient will report pain score less than 4/10 on VAS to demonstrate reduction of pain levels with functional tasks.     Baseline  10/23: 6/10 11/19: 5/10 12/12: 9/10 1/10: 7/10  2/11: 3/10     Time  2    Period  Weeks    Status  Achieved        PT Long Term Goals - 05/06/19 1738      PT LONG TERM GOAL #1   Title  Patient will report pain score of <3/10 on VAS for decreased pain levels and ease with functional activities.    Baseline  10/23: 6/10 11/19: 5/10 12/12 9/10 1/10: 7/10 2/11: 3/10 3/9: 6/10  5/26: 8/10 03/17/19: 7/10 8/3: 5/10 8/25: 4/10    Time  4    Period  Weeks    Status  Partially Met    Target Date  06/03/19      PT LONG TERM GOAL #2   Title  Patient will be able to ambulate on treadmill for 79mnutes without increase in back/leg pain demonstrating improved community ambulation and treadmill walking at pulmonary rehab    Baseline  10/23: 0226mutes increases pain 11/19: 26m42mtes 12/12: has not had the chance to perform 1/10: was able to do 5 minutes without pain 2/11: able to do 5 minutes then  had to bend over 3/9: 5 minutes 30 seconds at 2.0 mphs 5/26: had to walk 10 minutes at cancer center with extreme pain but was able to perform 7/6: walks 10 minutes with increase of pain by 2 points 8/3: able to perform with bent over posture    Time  4  Period  Weeks    Status  Achieved      PT LONG TERM GOAL #3   Title  Patient will have decreased modified oswestry score <50% for improved ability to complete household tasks with less pain levels.     Baseline  10/23: 62% 11/19: 42%      Time  4    Period  Weeks    Status  Achieved      PT LONG TERM GOAL #4   Title  Patient will score 4+/5 on BLE MMT to demonstrate improved glute strength and ability to carry groceries with increased ease up/down stairs at home.    Baseline  10/23: 4-/5 grossly 11/19: 4/5 grossly; hamstrings 4-/5 12/12: 4/5 gross 1/10: 4/5 gross 2/11: gross 4+/5 with hamstrings and abuductors 4-/5 3/9: gross 4+/5 hamstrings and abductors 4-/5 5/27: grossly 3+/5 with pain with LLE movements 7/6: grossly 4-/5 with pain with LLE movement 8/3: deferred due to swelling/pain 8/25: grossly 4/5 without pain    Time  4    Period  Weeks    Status  Partially Met    Target Date  06/03/19      PT LONG TERM GOAL #5   Title  Patient will have decreased modified oswestry score <35% for improved ability to complete household tasks with less pain levels.     Baseline  11/19: 42% 12/12: 52% 1/10: 48%  2/11: 30%     Time  4    Period  Weeks    Status  Achieved      PT LONG TERM GOAL #6   Title  Patient will be able to make and/or strip the bed without pain and without needing rest breaks to perform iADLs independently.     Baseline  2/11: pain limits ability to perform, ~4 breaks required 3/9: takes her time, 3 breaks 5/27: very painful still 7/6: requires only one rest break 8/3: able to perform with +2 pain increase 8/25: no rest breaks required, increased pain 4 points    Time  4    Period  Weeks    Status  Partially Met    Target  Date  06/03/19      PT LONG TERM GOAL #7   Title  Patient will have decreased modified oswestry score <20% for improved ability to complete household tasks with less pain levels.     Baseline  2/11: 30%  3/9: 30% 5/27: 63% 7/6: 38% 8/3: 42% 8/25: 36%    Time  4    Period  Weeks    Status  Partially Met    Target Date  06/03/19      PT LONG TERM GOAL #8   Title  Patient will tolerate >10 minutes of upright postural positioning of trunk with active mobility with <2 degrees VAS increase.    Baseline  8/3: unable to tolerate 10  minutes 8/25: tolerates 5 minutes    Time  4    Period  Weeks    Status  Partially Met    Target Date  06/03/19            Plan - 05/21/19 1433    Clinical Impression Statement  Patient initially limited in muscle tissue length due to prolonged bed rest however demonstrated improved alignment and mobility by end of session. Improved gait mechanics noted with ambulation during incline intervals with good abdominal activation and upright posture. Patient will continue to benefit from skilled physical therapy to improve strength, to reduce  pain levels with ADLs, and to improve quality of life.    Rehab Potential  Fair    Clinical Impairments Affecting Rehab Potential  (+) motivation (-) chronicity of symptoms    PT Frequency  2x / week    PT Duration  4 weeks    PT Treatment/Interventions  ADLs/Self Care Home Management;Cryotherapy;Moist Heat;Traction;Iontophoresis 18m/ml Dexamethasone;Ultrasound;Gait training;Stair training;Functional mobility training;Neuromuscular re-education;Balance training;Therapeutic exercise;Therapeutic activities;Patient/family education;Manual techniques;Energy conservation;Aquatic Therapy;Electrical Stimulation;Passive range of motion;Dry needling;Taping    PT Next Visit Plan  progress strengthening and postural mechanics    PT Home Exercise Plan  No updates this day    Consulted and Agree with Plan of Care  Patient       Patient  will benefit from skilled therapeutic intervention in order to improve the following deficits and impairments:  Abnormal gait, Cardiopulmonary status limiting activity, Decreased activity tolerance, Decreased balance, Decreased endurance, Decreased range of motion, Difficulty walking, Decreased strength, Hypomobility, Decreased mobility, Impaired perceived functional ability, Impaired flexibility, Postural dysfunction, Obesity, Improper body mechanics, Pain  Visit Diagnosis: Other abnormalities of gait and mobility  Chronic low back pain, unspecified back pain laterality, unspecified whether sciatica present  Abnormal posture  Muscle weakness (generalized)     Problem List Patient Active Problem List   Diagnosis Date Noted  . Edema 04/16/2019  . Postoperative wound infection 03/13/2019  . Seroma of breast 03/11/2019  . Age-related osteoporosis without current pathological fracture 02/27/2019  . Malignant neoplasm of central portion of right breast in female, estrogen receptor positive ([place] 02/12/2019  . Genetic testing 01/31/2019  . Malignant neoplasm of upper-inner quadrant of right breast in female, estrogen receptor positive ([place] 01/15/2019  . Long term current use of opiate analgesic 12/02/2018  . Tympanic membrane central perforation, left 11/04/2018  . DDD (degenerative disc disease), lumbosacral 10/10/2018  . Abnormal MRI, lumbar spine (08/29/2018) 10/10/2018  . 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 Lumbar nerve root impingement (Bilateral: L4 & L5) (L>R: L5) 10/10/2018  . Lumbar Spinal instability of L4-5 10/10/2018  . Lumbar facet hypertrophy 10/10/2018  . COPD (chronic obstructive pulmonary disease) with emphysema ([place] 08/28/2018  . Elevated rheumatoid factor 08/13/2018  . Elevated C-reactive protein (CRP) 08/12/2018  . Elevated sed rate 08/12/2018  . Lumbar facet syndrome (Bilateral) 08/12/2018  . Back pain with  left-sided radiculopathy 08/12/2018  . DDD (degenerative disc disease), lumbar 08/12/2018  . Chronic lower extremity pain (Primary Area of Pain) (Bilateral) (L>R) 07/23/2018  . Chronic low back pain (Secondary Area of Pain) (Bilateral) (L>R) w/ sciatica (Bilateral) 07/23/2018  . Chronic pain syndrome 07/23/2018  . Pharmacologic therapy 07/23/2018  . Disorder of skeletal system 07/23/2018  . Problems influencing health status 07/23/2018  . Chronic [facility]iliac joint [facility]) (Bilateral) (L>R) 07/23/2018  . Status post lumboperitoneal shunt placement 02/26/2018  . 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 Tympanic membrane perforation 05/30/2016  . PMB (postmenopausal bleeding) 04/26/2016  . Rash 12/27/2015  . 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 Plantar fasciitis of foot (Left) 05/26/2015  . Spondylosis of lumbar region without myelopathy or radiculopathy 05/26/2015  . Lumbar foraminal stenosis (L3-4, L4-5, L5-S1) (L>R) 05/26/2015  . Spinal stenosis of lumbar region with neurogenic claudication 05/26/2015  . Encounter for screening mammogram for malignant neoplasm of breast 12/02/2014  . Goiter 12/02/2014  . Chronic idiopathic gout of foot (Right) 07/06/2014  . Biceps tendonitis (Right) 05/19/2014  . Shoulder  pain (Right) 05/19/2014  . Facial weakness 05/05/2014  . Post herpetic neuralgia 05/05/2014  . Chronic dysfunction of left eustachian tube 03/24/2014  . Osteopenia 03/13/2014  . Lumbar Grade 1 Anterolisthesis of L3/4 (3m) & L4/5 (4-18m(w/ Dynamic instability) 03/13/2014  . Family history of colon cancer 12/02/2013  . Dyshidrotic eczema 11/17/2013  . Diastasis recti 04/08/2013  . Facial nerve paresis 07/31/2012  . Blepharospasm 05/08/2012  . 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 Obesity 03/28/2012  . Hemoptysis 12/22/2011  .  Neuropathic postherpetic trigeminal neuralgia 12/12/2011  . Allergic rhinitis, mild 10/12/2011  . Disequilibrium syndrome 09/27/2011  . Chronic bronchitis ([place]09/24/2012  . Essential hypertension 05/24/2011  . Type 2 diabetes mellitus without complications ([place]0920/[id number]. Pulmonary nodules 05/05/2011   MaJanna ArchPT, DPT   05/21/2019, 2:36 PM  CoC[facility]restAIN REBapt[facility] Restorative Care HospitalERVICES [id number]: [phone] Fax:  [phone]: LiBryona FoxworthyRN: [id number] of Birth: 02/1951/01/18

## 2019-05-21 NOTE — Therapy (Signed)
[place] MAIN [facility] SERVICES [address] [place], [place], [address] Phone: [phone]   Fax:  [phone]  Occupational Therapy Treatment  Patient Details  Name: Susan Callahan MRN: [id number] Date of Birth: 27-Oct-1950 Referring Provider (OT): Humberto Leep, MD   Encounter Date: 05/21/2019  OT End of Session - 05/21/19 1643    Visit Number  25    Number of Visits  36    Date for OT Re-Evaluation  05/20/19    Activity Tolerance  Patient tolerated treatment well;No increased pain    Behavior During Therapy  WFL for tasks assessed/performed       Past Medical History:  Diagnosis Date  . COPD (chronic obstructive pulmonary disease) ([place])   . Depression 1974  . Diabetes mellitus without complication ([place])   . Hypertension   . Migraine   . 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 LUMBAR PERITONEAL SHUNT    . TYMPANOSTOMY TUBE PLACEMENT      There were no vitals filed for this visit.  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 Pt presents with compression glove in place, Pt reports she has been wrapping her arm and hand dilligently between OT visits.    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 01/30/19 partial mastectomy. Margins clear. 0/2 LN  negative for  disease. actinic keritosis,DDD, DM, obesity, thyroid nodules, pulmonary nodules, chronic back pain, HTN, COPD, hx depression    Limitations  pain and swelling in R axilla and arm, chronic back pain, muscle weakness, impaired balance, decreased RUE AROM, abnormal gait, difficulty walking, impaired functional mobility and transfers,    Repetition  Increases Symptoms    Special Tests  -Stemmer at MPs bilaterally    Patient Stated Goals  relduce pain and improve movement   in my R arm so I can do things I need to do. reduce risk of lymphedema    Pain Onset  More than a month ago    Pain Onset  More than a month ago                   OT Treatments/Exercises (OP) - 05/21/19 0001      Manual Therapy   Manual Therapy  Edema management;Compression Bandaging;Manual Lymphatic Drainage (MLD)    Manual Lymphatic Drainage (MLD)  MLD to RUE/ RUQ  utilizing ipsilateral axillo-inguinal watershed, and anterior and posterior axillary anastamoses. Pt tolerated well in supine and sidelying. No myofacial release today.    Compression Bandaging  Resumed  finger wraps using cowrap under short stretch  arm wraps as established. Used  dorsal hand pad.             OT Education - 05/21/19 1643    Education Details  Continued skilled Pt/caregiver education  And LE ADL training throughout visit for lymphedema self care/ home program, including compression wrapping, compression garment and device wear/care, lymphatic pumping ther ex, simple self-MLD, and skin care. Discussed progress towards goals.    Person(s) Educated  Patient    Methods  Demonstration;Explanation;Tactile cues;Verbal cues;Handout    Comprehension  Verbalized understanding;Returned demonstration          OT Long Term Goals - 05/05/19 1314      OT LONG TERM GOAL #1  Title  Pt independent with modified lymphedema precautions and prevention strategies to limit LE progression.    Baseline  Max A    Time  4    Period  Days    Status  Achieved      OT LONG TERM GOAL #2   Title  Pt will be independent with lymphedema self-care home program and AWS ther ex to improve functional performance of basic and instrumental ADLs by DC.    Baseline  Max A    Time  12    Period  Weeks    Status  Achieved      OT LONG TERM GOAL #3   Title  Pt wil report 0/10 pain with RUE functional use ( reaching, lifting, carrying, dressing, dressing, etc) to achieve independent , pain free ADLs performance.     Baseline  Max A 05/05/19: Reporting 4/10 lymphadema pain now combined with AWS related pain.    Time  12    Period  Weeks    Status  Partially Met      OT LONG TERM GOAL #4   Title  Pt will be able to apply gradient compression wraps using correct techniques independently to control UE swelling and limit LE progression.    Baseline  Max A    Time  1    Period  Weeks    Status  New    Target Date  05/16/19            Plan - 05/21/19 1645    Clinical Impression Statement  R hand and distal arm swelling responded well to MLD today as evidenced by slight decrease in selling and tissue density at  dorsal hand. Stemmer sign remains positive. Pt compliant with LE self-care home program. Cording issues have not fully resolved , but at present are minimally painful at end ranges during functional RUE use. Cont as per POC.Pt agrees that we are looking for more pronounced response to CDT than we have observed to date.    OT Occupational Profile and History  Comprehensive Assessment- Review of records and extensive additional review of physical, cognitive, psychosocial history related to current functional performance    Occupational performance deficits (Please refer to evaluation for details):  ADL's;IADL's;Social Participation;Leisure;Rest and Sleep;Work    Marketing executive / Function / Physical Skills  ADL;Decreased knowledge of precautions;ROM;UE functional use;Decreased knowledge of use of DME;Edema;Skin integrity;Pain;IADL    Rehab Potential  Good    Clinical Decision Making  Several treatment options, min-mod task modification necessary    Comorbidities Affecting Occupational Performance:  Presence of comorbidities impacting occupational performance    Comorbidities impacting occupational performance description:  see SUBJUECTIVE    Modification or Assistance to Complete Evaluation   Min-Moderate modification of tasks or assist with assess necessary to complete eval    OT Frequency  2x / week     OT Duration  12 weeks    OT Treatment/Interventions  Self-care/ADL training;Therapeutic exercise;Manual lymph drainage;Patient/family education;Therapeutic activities;DME and/or AE instruction;Manual Therapy;Other (comment)   consider fitting with prophylactic cmopression arm sleeve   Plan  BUE AROM ther ex to reduce axillary cording- 2 sets of 10 2-3 x daily    Consulted and Agree with Plan of Care  Patient       Patient will benefit from skilled therapeutic intervention in order to improve the following deficits and impairments:   Body Structure / Function / Physical Skills: ADL, Decreased knowledge of precautions, ROM, UE functional use, Decreased knowledge  of use of DME, Edema, Skin integrity, Pain, IADL       Visit Diagnosis: Post-mastectomy lymphedema syndrome    Problem List Patient Active Problem List   Diagnosis Date Noted  . Edema 04/16/2019  . Postoperative wound infection 03/13/2019  . Seroma of breast 03/11/2019  . Age-related osteoporosis without current pathological fracture 02/27/2019  . Malignant neoplasm of central portion of right breast in female, estrogen receptor positive ([place]) 02/12/2019  . Genetic testing 01/31/2019  . Malignant neoplasm of upper-inner quadrant of right breast in female, estrogen receptor positive ([place]) 01/15/2019  . Long term current use of opiate analgesic 12/02/2018  . Tympanic membrane central perforation, left 11/04/2018  . DDD (degenerative disc disease), lumbosacral 10/10/2018  . Abnormal MRI, lumbar spine (08/29/2018) 10/10/2018  . 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 Lumbar nerve root impingement (Bilateral: L4 & L5) (L>R: L5) 10/10/2018  . Lumbar Spinal instability of L4-5 10/10/2018  . Lumbar facet hypertrophy 10/10/2018  . COPD (chronic obstructive pulmonary disease) with emphysema ([place]) 08/28/2018  . Elevated rheumatoid factor 08/13/2018  . Elevated C-reactive protein (CRP)  08/12/2018  . Elevated sed rate 08/12/2018  . Lumbar facet syndrome (Bilateral) 08/12/2018  . Back pain with left-sided radiculopathy 08/12/2018  . DDD (degenerative disc disease), lumbar 08/12/2018  . Chronic lower extremity pain (Primary Area of Pain) (Bilateral) (L>R) 07/23/2018  . Chronic low back pain (Secondary Area of Pain) (Bilateral) (L>R) w/ sciatica (Bilateral) 07/23/2018  . Chronic pain syndrome 07/23/2018  . Pharmacologic therapy 07/23/2018  . Disorder of skeletal system 07/23/2018  . Problems influencing health status 07/23/2018  . Chronic sacroiliac joint pain [facility] Area of Pain) (Bilateral) (L>R) 07/23/2018  . Status post lumboperitoneal shunt placement 02/26/2018  . 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 Tympanic membrane perforation 05/30/2016  . PMB (postmenopausal bleeding) 04/26/2016  . Rash 12/27/2015  . 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 Plantar fasciitis of foot (Left) 05/26/2015  . Spondylosis of lumbar region without myelopathy or radiculopathy 05/26/2015  . Lumbar foraminal stenosis (L3-4, L4-5, L5-S1) (L>R) 05/26/2015  . Spinal stenosis of lumbar region with neurogenic claudication 05/26/2015  . Encounter for screening mammogram for malignant neoplasm of breast 12/02/2014  . Goiter 12/02/2014  . Chronic idiopathic gout of foot (Right) 07/06/2014  . Biceps tendonitis (Right) 05/19/2014  . Shoulder pain (Right) 05/19/2014  . Facial weakness 05/05/2014  . Post herpetic neuralgia 05/05/2014  . Chronic dysfunction of left eustachian tube 03/24/2014  . Osteopenia 03/13/2014  . Lumbar Grade 1 Anterolisthesis of L3/4 (35m) & L4/5 (4-120m(w/ Dynamic instability) 03/13/2014  . Family history of colon cancer 12/02/2013  . Dyshidrotic eczema 11/17/2013  . Diastasis recti 04/08/2013  . Facial nerve paresis 07/31/2012  . Blepharospasm 05/08/2012  . Conductive hearing loss of left ear  with unrestricted hearing of right ear 04/08/2012  . Obesity 03/28/2012  . Hemoptysis 12/22/2011  . Neuropathic postherpetic trigeminal neuralgia 12/12/2011  . Allergic rhinitis, mild 10/12/2011  . Disequilibrium syndrome 09/27/2011  . Chronic bronchitis ([facility]09/24/2012  . Essential hypertension 05/24/2011  . Type 2 diabetes mellitus without complications ([id number]. Pulmonary nodules 05/05/2011    ThAndrey SpearmanMS, OTR/L, [facility]9/09/20 4:49 PM   [place]letonAIN REHoly [facility] HospitalERVICES [address] [id number]: [phone] Fax:  [phone]: Susan MarchettiRN: [id number] of Birth: 6/24-Aug-1951

## 2019-05-22 ENCOUNTER — Other Ambulatory Visit: Payer: Self-pay

## 2019-05-22 ENCOUNTER — Ambulatory Visit: Payer: Medicare Other | Admitting: Occupational Therapy

## 2019-05-22 DIAGNOSIS — I972 Postmastectomy lymphedema syndrome: Secondary | ICD-10-CM | POA: Diagnosis not present

## 2019-05-22 NOTE — Therapy (Signed)
[place] MAIN [facility] SERVICES [address] [place], [place], [address] Phone: [phone]   Fax:  [phone]  Occupational Therapy Treatment  Patient Details  Name: Susan Callahan MRN: [id number] Date of Birth: [birth year] Referring Provider (OT): Humberto Leep, MD   Encounter Date: 05/22/2019  OT End of Session - 05/22/19 1517    Visit Number  26    Number of Visits  36    Date for OT Re-Evaluation  08/20/19    OT Start Time  0310    OT Stop Time  0430    OT Time Calculation (min)  80 min    Activity Tolerance  Patient tolerated treatment well;No increased pain    Behavior During Therapy  WFL for tasks assessed/performed       Past Medical History:  Diagnosis Date  . COPD (chronic obstructive pulmonary disease) ([place])   . Depression 1974  . Diabetes mellitus without complication ([place])   . Hypertension   . Migraine   . 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 LUMBAR PERITONEAL SHUNT    . TYMPANOSTOMY TUBE PLACEMENT      There were no vitals filed for this visit.  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 Pt presents with compression glove in place, Pt reports she has been wrapping her arm and hand dilligently between OT visits.    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 01/30/19 partial mastectomy. Margins clear. 0/2 LN  negative for  disease. actinic keritosis,DDD, DM, obesity, thyroid nodules, pulmonary nodules, chronic back pain, HTN, COPD, hx depression    Limitations  pain and swelling in R axilla and arm, chronic back pain, muscle weakness, impaired balance, decreased RUE AROM, abnormal gait, difficulty walking, impaired functional mobility and transfers,    Repetition  Increases Symptoms    Special Tests   -Stemmer at MPs bilaterally    Patient Stated Goals  relduce pain and improve movement  in my R arm so I can do things I need to do. reduce risk of lymphedema    Currently in Pain?  No/denies    Pain Onset  More than a month ago    Pain Onset  More than a month ago                   OT Treatments/Exercises (OP) - 05/22/19 0001      ADLs   ADL Education Given  Yes      Manual Therapy   Manual Therapy  Edema management;Compression Bandaging;Manual Lymphatic Drainage (MLD)    Manual therapy comments  Kinesiotape to dorsal and volar hand, wrost and forearm using 4 finger fan cuts and anchoring at proximal FA under custom fabricated glove in effort to passivly stretch skin       to decongest hamnd.    Manual Lymphatic Drainage (MLD)  MLD to RUE/ RUQ  utilizing ipsilateral axillo-inguinal watershed, and anterior and posterior axillary anastamoses. Pt tolerated well in supine and sidelying. No myofacial release today.    Compression Bandaging  Resumed  finger wraps using cowrap under short stretch  arm wraps as established. Used  dorsal hand pad.             OT Education - 05/22/19 1517  Education Details  Continued skilled Pt/caregiver education  And LE ADL training throughout visit. Emphasis on Kinesiotaping for lymphatic dranage of hand and forearm    Person(s) Educated  Patient    Methods  Demonstration;Explanation;Tactile cues;Verbal cues;Handout    Comprehension  Verbalized understanding;Returned demonstration          OT Long Term Goals - 05/22/19 1706      OT LONG TERM GOAL #1   Title  Pt independent with modified lymphedema precautions and prevention strategies to limit LE progression.    Baseline  Max A    Time  4    Period  Days    Status  Achieved      OT LONG TERM GOAL #2   Title  Pt will be independent with lymphedema self-care home program and AWS ther ex to improve functional performance of basic and instrumental ADLs by DC.    Baseline  Max A     Time  12    Period  Weeks    Status  Achieved      OT LONG TERM GOAL #3   Title  Pt wil report 0/10 pain with RUE functional use ( reaching, lifting, carrying, dressing, dressing, etc) to achieve independent , pain free ADLs performance.    Baseline  Max A 05/05/19: Reporting 4/10 lymphadema pain now combined with AWS related pain. 05/22/19 AWS pain resolved. Pt now experiencing pain in hand and distal FA associated with recent onset of lymphedema.    Time  12    Period  Weeks    Status  Partially Met      OT LONG TERM GOAL #4   Title  Pt will be able to apply gradient compression wraps using correct techniques independently to control UE swelling and limit LE progression.    Baseline  Max A    Time  1    Period  Weeks    Status  Achieved            Plan - 05/22/19 1702    Clinical Impression Statement  Provided MLD  to RUE/RUQ utilizinf AAA anastamosis. Concentrated efforts on dorsal and palmar hand surfaces with moderate fibrosis technique. Fabricated custom glove using comination of cowrap on finger and coban on hand. Added kinesiotae on volar and palmar forearm under glove in effort to decongest stubborn hand swelling. Lastly added "cigarette foam" dorsal hand pad under bandages to add extra pressure at MPs. Pt tolerated and participated in all aspects of therapy. Cnot as per OPC until more positive response is achieved.    OT Occupational Profile and History  Comprehensive Assessment- Review of records and extensive additional review of physical, cognitive, psychosocial history related to current functional performance    Occupational performance deficits (Please refer to evaluation for details):  ADL's;IADL's;Social Participation;Leisure;Rest and Sleep;Work    Marketing executive / Function / Physical Skills  ADL;Decreased knowledge of precautions;ROM;UE functional use;Decreased knowledge of use of DME;Edema;Skin integrity;Pain;IADL    Rehab Potential  Good    Clinical Decision  Making  Several treatment options, min-mod task modification necessary    Comorbidities Affecting Occupational Performance:  Presence of comorbidities impacting occupational performance    Comorbidities impacting occupational performance description:  see SUBJUECTIVE    Modification or Assistance to Complete Evaluation   Min-Moderate modification of tasks or assist with assess necessary to complete eval    OT Frequency  2x / week    OT Duration  12 weeks    OT Treatment/Interventions  Self-care/ADL  training;Therapeutic exercise;Manual lymph drainage;Patient/family education;Therapeutic activities;DME and/or AE instruction;Manual Therapy;Other (comment)   consider fitting with prophylactic cmopression arm sleeve   Plan  BUE AROM ther ex to reduce axillary cording- 2 sets of 10 2-3 x daily    Consulted and Agree with Plan of Care  Patient       Patient will benefit from skilled therapeutic intervention in order to improve the following deficits and impairments:   Body Structure / Function / Physical Skills: ADL, Decreased knowledge of precautions, ROM, UE functional use, Decreased knowledge of use of DME, Edema, Skin integrity, Pain, IADL       Visit Diagnosis: Post-mastectomy lymphedema syndrome    Problem List Patient Active Problem List   Diagnosis Date Noted  . Edema 04/16/2019  . Postoperative wound infection 03/13/2019  . Seroma of breast 03/11/2019  . Age-related osteoporosis without current pathological fracture 02/27/2019  . Malignant neoplasm of central portion of right breast in female, estrogen receptor positive ([place]) 02/12/2019  . Genetic testing 01/31/2019  . Malignant neoplasm of upper-inner quadrant of right breast in female, estrogen receptor positive ([place]) 01/15/2019  . Long term current use of opiate analgesic 12/02/2018  . Tympanic membrane central perforation, left 11/04/2018  . DDD (degenerative disc disease), lumbosacral 10/10/2018  . Abnormal MRI, lumbar  spine (08/29/2018) 10/10/2018  . 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 Lumbar nerve root impingement (Bilateral: L4 & L5) (L>R: L5) 10/10/2018  . Lumbar Spinal instability of L4-5 10/10/2018  . Lumbar facet hypertrophy 10/10/2018  . COPD (chronic obstructive pulmonary disease) with emphysema ([place]) 08/28/2018  . Elevated rheumatoid factor 08/13/2018  . Elevated C-reactive protein (CRP) 08/12/2018  . Elevated sed rate 08/12/2018  . Lumbar facet syndrome (Bilateral) 08/12/2018  . Back pain with left-sided radiculopathy 08/12/2018  . DDD (degenerative disc disease), lumbar 08/12/2018  . Chronic lower extremity pain (Primary Area of Pain) (Bilateral) (L>R) 07/23/2018  . Chronic low back pain (Secondary Area of Pain) (Bilateral) (L>R) w/ sciatica (Bilateral) 07/23/2018  . Chronic pain syndrome 07/23/2018  . Pharmacologic therapy 07/23/2018  . Disorder of skeletal system 07/23/2018  . Problems influencing health status 07/23/2018  . Chronic sacroiliac joint pain [facility] Area of Pain) (Bilateral) (L>R) 07/23/2018  . Status post lumboperitoneal shunt placement 02/26/2018  . 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 Tympanic membrane perforation 05/30/2016  . PMB (postmenopausal bleeding) 04/26/2016  . Rash 12/27/2015  . 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 Plantar fasciitis of foot (Left) 05/26/2015  . Spondylosis of lumbar region without myelopathy or radiculopathy 05/26/2015  . Lumbar foraminal stenosis (L3-4, L4-5, L5-S1) (L>R) 05/26/2015  . Spinal stenosis of lumbar region with neurogenic claudication 05/26/2015  . Encounter for screening mammogram for malignant neoplasm of breast 12/02/2014  . Goiter 12/02/2014  . Chronic idiopathic gout of foot (Right) 07/06/2014  . Biceps tendonitis (Right) 05/19/2014  . Shoulder pain (Right) 05/19/2014  . Facial weakness 05/05/2014   . Post herpetic neuralgia 05/05/2014  . Chronic dysfunction of left eustachian tube 03/24/2014  . Osteopenia 03/13/2014  . Lumbar Grade 1 Anterolisthesis of L3/4 (32m) & L4/5 (4-142m(w/ Dynamic instability) 03/13/2014  . Family history of colon cancer 12/02/2013  . Dyshidrotic eczema 11/17/2013  . Diastasis recti 04/08/2013  . Facial nerve paresis 07/31/2012  . Blepharospasm 05/08/2012  . 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 Obesity 03/28/2012  . Hemoptysis 12/22/2011  .  Neuropathic postherpetic trigeminal neuralgia 12/12/2011  . Allergic rhinitis, mild 10/12/2011  . Disequilibrium syndrome 09/27/2011  . Chronic bronchitis ([place]) 06/05/2011  . Essential hypertension 05/24/2011  . Type 2 diabetes mellitus without complications ([place]) 75/88/3254  . Pulmonary nodules 05/05/2011    Andrey Spearman, MS, OTR/L, [facility] 05/22/19 5:08 PM  [place] MAIN [facility] SERVICES [address] [place], [place], [address] Phone: [phone]   Fax:  [phone]  Name: Susan Callahan MRN: [id number] Date of Birth: 11/21/1950

## 2019-05-26 ENCOUNTER — Ambulatory Visit: Payer: Medicare Other | Admitting: Occupational Therapy

## 2019-05-26 ENCOUNTER — Ambulatory Visit: Payer: Medicare Other

## 2019-05-26 ENCOUNTER — Other Ambulatory Visit: Payer: Self-pay

## 2019-05-26 DIAGNOSIS — I972 Postmastectomy lymphedema syndrome: Secondary | ICD-10-CM

## 2019-05-26 DIAGNOSIS — R293 Abnormal posture: Secondary | ICD-10-CM

## 2019-05-26 DIAGNOSIS — R2689 Other abnormalities of gait and mobility: Secondary | ICD-10-CM

## 2019-05-26 DIAGNOSIS — M545 Low back pain, unspecified: Secondary | ICD-10-CM

## 2019-05-26 DIAGNOSIS — M6281 Muscle weakness (generalized): Secondary | ICD-10-CM

## 2019-05-26 DIAGNOSIS — G8929 Other chronic pain: Secondary | ICD-10-CM

## 2019-05-26 NOTE — Therapy (Signed)
[place] [facility] SERVICES [address] [place], [place], [address] Phone: [phone]   Fax:  [phone]  Occupational Therapy Treatment  Patient Details  Name: Susan Callahan MRN: [id number] Date of Birth: [birth year] Referring Provider (OT): Humberto Leep, MD   Encounter Date: 05/26/2019  OT End of Session - 05/26/19 1624    Visit Number  27    Number of Visits  36    Date for OT Re-Evaluation  08/20/19    OT Start Time  0305    OT Stop Time  0405    OT Time Calculation (min)  60 min    Activity Tolerance  Patient tolerated treatment well;No increased pain    Behavior During Therapy  WFL for tasks assessed/performed       Past Medical History:  Diagnosis Date  . COPD (chronic obstructive pulmonary disease) ([place])   . Depression 1974  . Diabetes mellitus without complication ([place])   . Hypertension   . Migraine   . 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 LUMBAR PERITONEAL SHUNT    . TYMPANOSTOMY TUBE PLACEMENT      There were no vitals filed for this visit.  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 01/30/19 partial mastectomy. Margins clear. 0/2 LN  negative for  disease. actinic keritosis,DDD, DM, obesity, thyroid nodules, pulmonary nodules, chronic back pain, HTN, COPD, hx depression    Limitations  pain and swelling in R axilla and arm, chronic back pain, muscle weakness, impaired balance, decreased RUE AROM, abnormal gait, difficulty walking, impaired functional mobility and transfers,    Repetition  Increases Symptoms    Special Tests  -Stemmer at MPs bilaterally     Patient Stated Goals  relduce pain and improve movement  in my R arm so I can do things I need to do. reduce risk of lymphedema    Currently in Pain?  Yes   unchanhed   for back. Minimal for cording. Not rated numerically today.   Pain Onset  More than a month ago    Pain Onset  More than a month ago                   OT Treatments/Exercises (OP) - 05/26/19 0001      ADLs   ADL Education Given  Yes      Manual Therapy   Manual Therapy  Edema management;Compression Bandaging;Manual Lymphatic Drainage (MLD)    Edema Management  anatomical measurements for OTS Juzo knee length compression garments    Manual Lymphatic Drainage (MLD)  anatomical measurements for OTS Juzo knee length compression garments    Compression Bandaging  Pt to apply wraps at home after session 2/2 time constraints             OT Education - 05/26/19 1624    Education Details  Continued skilled Pt/caregiver education  And LE ADL training throughout visit. Emphasis on Kinesiotaping for lymphatic dranage of hand and forearm    Person(s) Educated  Patient    Methods  Demonstration;Explanation;Tactile cues;Verbal cues;Handout    Comprehension  Verbalized  understanding;Returned demonstration          OT Long Term Goals - 05/22/19 1706      OT LONG TERM GOAL #1   Title  Pt independent with modified lymphedema precautions and prevention strategies to limit LE progression.    Baseline  Max A    Time  4    Period  Days    Status  Achieved      OT LONG TERM GOAL #2   Title  Pt will be independent with lymphedema self-care home program and AWS ther ex to improve functional performance of basic and instrumental ADLs by DC.    Baseline  Max A    Time  12    Period  Weeks    Status  Achieved      OT LONG TERM GOAL #3   Title  Pt wil report 0/10 pain with RUE functional use ( reaching, lifting, carrying, dressing, dressing, etc) to achieve independent , pain free ADLs performance.    Baseline   Max A 05/05/19: Reporting 4/10 lymphadema pain now combined with AWS related pain. 05/22/19 AWS pain resolved. Pt now experiencing pain in hand and distal FA associated with recent onset of lymphedema.    Time  12    Period  Weeks    Status  Partially Met      OT LONG TERM GOAL #4   Title  Pt will be able to apply gradient compression wraps using correct techniques independently to control UE swelling and limit LE progression.    Baseline  Max A    Time  1    Period  Weeks    Status  Achieved            Plan - 05/26/19 1625    Clinical Impression Statement  R hand swelling is noticably decreased today when wraps removed. Dorsal hand padm appears to be correct addition , altho  mild MP swelling persists. Completed anatomical measurements for OTS Juzo knee length compression garments: Juzo SOFT, ccl 1 ( 20-30 mmHg) size IV, Reg length, open toe + silicone top band. Provided measurements and printed resource for reference. Pt toerated MD in supine and side lying as esablished. She will apply wraps at home after session due to time constraints. Cont as epr POC.    OT Occupational Profile and History  Comprehensive Assessment- Review of records and extensive additional review of physical, cognitive, psychosocial history related to current functional performance    Occupational performance deficits (Please refer to evaluation for details):  ADL's;IADL's;Social Participation;Leisure;Rest and Sleep;Work    Marketing executive / Function / Physical Skills  ADL;Decreased knowledge of precautions;ROM;UE functional use;Decreased knowledge of use of DME;Edema;Skin integrity;Pain;IADL    Rehab Potential  Good    Clinical Decision Making  Several treatment options, min-mod task modification necessary    Comorbidities Affecting Occupational Performance:  Presence of comorbidities impacting occupational performance    Comorbidities impacting occupational performance description:  see SUBJUECTIVE    Modification  or Assistance to Complete Evaluation   Min-Moderate modification of tasks or assist with assess necessary to complete eval    OT Frequency  2x / week    OT Duration  12 weeks    OT Treatment/Interventions  Self-care/ADL training;Therapeutic exercise;Manual lymph drainage;Patient/family education;Therapeutic activities;DME and/or AE instruction;Manual Therapy;Other (comment)   consider fitting with prophylactic cmopression arm sleeve   Plan  BUE AROM ther ex to reduce axillary cording- 2 sets of 10 2-3 x daily    Consulted  and Agree with Plan of Care  Patient       Patient will benefit from skilled therapeutic intervention in order to improve the following deficits and impairments:   Body Structure / Function / Physical Skills: ADL, Decreased knowledge of precautions, ROM, UE functional use, Decreased knowledge of use of DME, Edema, Skin integrity, Pain, IADL       Visit Diagnosis: Post-mastectomy lymphedema syndrome    Problem List Patient Active Problem List   Diagnosis Date Noted  . Edema 04/16/2019  . Postoperative wound infection 03/13/2019  . Seroma of breast 03/11/2019  . Age-related osteoporosis without current pathological fracture 02/27/2019  . Malignant neoplasm of central portion of right breast in female, estrogen receptor positive ([place]) 02/12/2019  . Genetic testing 01/31/2019  . Malignant neoplasm of upper-inner quadrant of right breast in female, estrogen receptor positive ([place]) 01/15/2019  . Long term current use of opiate analgesic 12/02/2018  . Tympanic membrane central perforation, left 11/04/2018  . DDD (degenerative disc disease), lumbosacral 10/10/2018  . Abnormal MRI, lumbar spine (08/29/2018) 10/10/2018  . 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 Lumbar nerve root impingement (Bilateral: L4 & L5) (L>R: L5) 10/10/2018  . Lumbar Spinal instability of L4-5 10/10/2018  . Lumbar facet hypertrophy 10/10/2018  . COPD  (chronic obstructive pulmonary disease) with emphysema ([place]) 08/28/2018  . Elevated rheumatoid factor 08/13/2018  . Elevated C-reactive protein (CRP) 08/12/2018  . Elevated sed rate 08/12/2018  . Lumbar facet syndrome (Bilateral) 08/12/2018  . Back pain with left-sided radiculopathy 08/12/2018  . DDD (degenerative disc disease), lumbar 08/12/2018  . Chronic lower extremity pain (Primary Area of Pain) (Bilateral) (L>R) 07/23/2018  . Chronic low back pain (Secondary Area of Pain) (Bilateral) (L>R) w/ sciatica (Bilateral) 07/23/2018  . Chronic pain syndrome 07/23/2018  . Pharmacologic therapy 07/23/2018  . Disorder of skeletal system 07/23/2018  . Problems influencing health status 07/23/2018  . Chronic sacroiliac joint pain [facility] Area of Pain) (Bilateral) (L>R) 07/23/2018  . Status post lumboperitoneal shunt placement 02/26/2018  . 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 Tympanic membrane perforation 05/30/2016  . PMB (postmenopausal bleeding) 04/26/2016  . Rash 12/27/2015  . 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 Plantar fasciitis of foot (Left) 05/26/2015  . Spondylosis of lumbar region without myelopathy or radiculopathy 05/26/2015  . Lumbar foraminal stenosis (L3-4, L4-5, L5-S1) (L>R) 05/26/2015  . Spinal stenosis of lumbar region with neurogenic claudication 05/26/2015  . Encounter for screening mammogram for malignant neoplasm of breast 12/02/2014  . Goiter 12/02/2014  . Chronic idiopathic gout of foot (Right) 07/06/2014  . Biceps tendonitis (Right) 05/19/2014  . Shoulder pain (Right) 05/19/2014  . Facial weakness 05/05/2014  . Post herpetic neuralgia 05/05/2014  . Chronic dysfunction of left eustachian tube 03/24/2014  . Osteopenia 03/13/2014  . Lumbar Grade 1 Anterolisthesis of L3/4 (20m) & L4/5 (4-147m(w/ Dynamic instability) 03/13/2014  . Family history of colon cancer 12/02/2013  . Dyshidrotic  eczema 11/17/2013  . Diastasis recti 04/08/2013  . Facial nerve paresis 07/31/2012  . Blepharospasm 05/08/2012  . 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 Obesity 03/28/2012  . Hemoptysis 12/22/2011  . Neuropathic postherpetic trigeminal neuralgia 12/12/2011  . Allergic rhinitis, mild 10/12/2011  . Disequilibrium syndrome 09/27/2011  . Chronic bronchitis ([place]09/24/2012  . Essential hypertension 05/24/2011  . Type 2 diabetes mellitus without complications ([id number]. Pulmonary nodules  05/05/2011    Andrey Spearman, MS, OTR/L, [facility] 05/26/19 4:29 PM   [place] MAIN [facility] SERVICES [address] [place], [place], [address] Phone: [phone]   Fax:  [phone]  Name: Baleigh Rennaker MRN: [id number] Date of Birth: August 15, 1951

## 2019-05-26 NOTE — Therapy (Signed)
[facility] [facility] SERVICES [address] [place], [place], [address] Phone: [phone]   Fax:  [phone]  Physical Therapy Treatment  Patient Details  Name: Susan Callahan MRN: [id number] Date of Birth: 1951/06/11 Referring Provider (PT): Francesco Sor   Encounter Date: 05/26/2019  PT End of Session - 05/26/19 1638    Visit Number  2    Number of Visits  71    Date for PT Re-Evaluation  06/03/19    Authorization Type  4/10 PN on 05/06/19    PT Start Time  1611    PT Stop Time  1645    PT Time Calculation (min)  34 min    Equipment Utilized During Treatment  Gait belt    Activity Tolerance  Patient tolerated treatment well    Behavior During Therapy  [facility] for tasks assessed/performed       Past Medical History:  Diagnosis Date  . COPD (chronic obstructive pulmonary disease) ([place])   . Depression 1974  . Diabetes mellitus without complication ([place])   . Hypertension   . Migraine   . 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 LUMBAR PERITONEAL SHUNT    . TYMPANOSTOMY TUBE PLACEMENT      There were no vitals filed for this visit.  Subjective Assessment - 05/26/19 1636    Subjective  Patient has been compliant with HEP. No falls since last session. Running late due to prolonged lymphedema treatment.    Pertinent History  Pain began in 2007 and has gradually got worse and has progressed to nonstop. Patient states pain last all day long, everyday with no change in symptoms. Medication helps briefly. States she has difficulty with showering and standing. States she has to stand with her knee bent due to pulling sensation in legs. Pain begins immediately when stands and begins to walk. Pain feels better when she bends her knees when standing. Pain became constant and non stop around Dec 2018. Denies bowel or bladder changes, unexplained weakness, or unexpected weight loss/gain. She  currently goes to pulmonary rehab 3x a week, but has increased pain with treadmill walking. Will walk with cane on occasion around house especially at night due to occasional dizziness and vision impairments in medical history. States she also has difficulty with carrying groceries up steps due to low back pain. She would like to be able to walk better and have less pain to be less reliant on medication.     Limitations  Lifting;Standing;Walking;House hold activities    How long can you stand comfortably?  pain begins immediately     How long can you walk comfortably?  pain begins immediately     Patient Stated Goals  walk better, have less pain    Currently in Pain?  Yes    Pain Score  3     Pain Location  Back    Pain Orientation  Lower    Pain Descriptors / Indicators  Aching    Pain Type  Chronic pain    Pain Onset  More than a month ago    Pain Frequency  Intermittent           manual  Roller to lumbar and thoracic paraspinals x 3 minutes.  Calf rollout with "the stick" 2 minutes each calf  j swipe mobilization for postural alignment 10 seconds x 3 each level.    Ther-ex: cueing for sequencing, positioning and  muscle activation   Posterior pelvic tilts x15. tactile and verbal cue for appropriate technique 5 seconds with hold, improved alignment Standing calf stretch on treadmill 30 seconds x 2 trials each LE GTB abduction 15x with posterior pelvic tilt GTB marches with posterior pelvic tilt and TrA activation 10x.  adduction ball squeeze hooklying with posterior pelvic tilt 10x 3 second holds  Seated TrA activation with upright posture marching 10x each LE   Treadmill 2.0 mph, alternating between 1-2% incline for interval training 7 minutes. Cueing for abdominal activation and sequencing of muscle activation. Able to maintain heel strike and neutral spinal alignment with BUE support.     Pt educated throughout session about proper posture and technique with exercises.  Improved exercise technique, movement at target joints, use of target muscles after min to mod verbal, visual, tactile cues.                       PT Education - 05/26/19 1637    Education Details  exercise technique, body mechanics    Person(s) Educated  Patient    Methods  Explanation;Demonstration;Tactile cues;Verbal cues    Comprehension  Verbalized understanding;Returned demonstration;Verbal cues required;Tactile cues required       PT Short Term Goals - 05/06/19 1738      PT SHORT TERM GOAL #1   Title  Patient will be independent with completion of HEP to improve ability to complete functional tasks and functional ADLs.    Baseline  10/23: will give HEP next session 11/19: HEP compliance    Time  2    Period  Weeks    Status  Achieved      PT SHORT TERM GOAL #2   Title  Patient will report pain score less than 4/10 on VAS to demonstrate reduction of pain levels with functional tasks.     Baseline  10/23: 6/10 11/19: 5/10 12/12: 9/10 1/10: 7/10  2/11: 3/10     Time  2    Period  Weeks    Status  Achieved        PT Long Term Goals - 05/06/19 1738      PT LONG TERM GOAL #1   Title  Patient will report pain score of <3/10 on VAS for decreased pain levels and ease with functional activities.    Baseline  10/23: 6/10 11/19: 5/10 12/12 9/10 1/10: 7/10 2/11: 3/10 3/9: 6/10  5/26: 8/10 03/17/19: 7/10 8/3: 5/10 8/25: 4/10    Time  4    Period  Weeks    Status  Partially Met    Target Date  06/03/19      PT LONG TERM GOAL #2   Title  Patient will be able to ambulate on treadmill for 454mnutes without increase in back/leg pain demonstrating improved community ambulation and treadmill walking at pulmonary rehab    Baseline  10/23: 017mutes increases pain 11/19: 54m15mtes 12/12: has not had the chance to perform 1/10: was able to do 5 minutes without pain 2/11: able to do 5 minutes then had to bend over 3/9: 5 minutes 30 seconds at 2.0 mphs 5/26: had to walk 10  minutes at cancer center with extreme pain but was able to perform 7/6: walks 10 minutes with increase of pain by 2 points 8/3: able to perform with bent over posture    Time  4    Period  Weeks    Status  Achieved      PT LONG  TERM GOAL #3   Title  Patient will have decreased modified oswestry score <50% for improved ability to complete household tasks with less pain levels.     Baseline  10/23: 62% 11/19: 42%      Time  4    Period  Weeks    Status  Achieved      PT LONG TERM GOAL #4   Title  Patient will score 4+/5 on BLE MMT to demonstrate improved glute strength and ability to carry groceries with increased ease up/down stairs at home.    Baseline  10/23: 4-/5 grossly 11/19: 4/5 grossly; hamstrings 4-/5 12/12: 4/5 gross 1/10: 4/5 gross 2/11: gross 4+/5 with hamstrings and abuductors 4-/5 3/9: gross 4+/5 hamstrings and abductors 4-/5 5/27: grossly 3+/5 with pain with LLE movements 7/6: grossly 4-/5 with pain with LLE movement 8/3: deferred due to swelling/pain 8/25: grossly 4/5 without pain    Time  4    Period  Weeks    Status  Partially Met    Target Date  06/03/19      PT LONG TERM GOAL #5   Title  Patient will have decreased modified oswestry score <35% for improved ability to complete household tasks with less pain levels.     Baseline  11/19: 42% 12/12: 52% 1/10: 48%  2/11: 30%     Time  4    Period  Weeks    Status  Achieved      PT LONG TERM GOAL #6   Title  Patient will be able to make and/or strip the bed without pain and without needing rest breaks to perform iADLs independently.     Baseline  2/11: pain limits ability to perform, ~4 breaks required 3/9: takes her time, 3 breaks 5/27: very painful still 7/6: requires only one rest break 8/3: able to perform with +2 pain increase 8/25: no rest breaks required, increased pain 4 points    Time  4    Period  Weeks    Status  Partially Met    Target Date  06/03/19      PT LONG TERM GOAL #7   Title  Patient will have  decreased modified oswestry score <20% for improved ability to complete household tasks with less pain levels.     Baseline  2/11: 30%  3/9: 30% 5/27: 63% 7/6: 38% 8/3: 42% 8/25: 36%    Time  4    Period  Weeks    Status  Partially Met    Target Date  06/03/19      PT LONG TERM GOAL #8   Title  Patient will tolerate >10 minutes of upright postural positioning of trunk with active mobility with <2 degrees VAS increase.    Baseline  8/3: unable to tolerate 10  minutes 8/25: tolerates 5 minutes    Time  4    Period  Weeks    Status  Partially Met    Target Date  06/03/19            Plan - 05/26/19 1639    Clinical Impression Statement  Patient arrived late to session due to being in lymphedema treatment for longer than expected. Patient is having improved mobility of spine and paraspinals. Improved foot clearance with ambulation noted with improving postural correction and alignment. Patient will continue to benefit from skilled physical therapy to improve strength, to reduce pain levels with ADLs, and to improve quality of life.    Rehab Potential  Fair  Clinical Impairments Affecting Rehab Potential  (+) motivation (-) chronicity of symptoms    PT Frequency  2x / week    PT Duration  4 weeks    PT Treatment/Interventions  ADLs/Self Care Home Management;Cryotherapy;Moist Heat;Traction;Iontophoresis 18m/ml Dexamethasone;Ultrasound;Gait training;Stair training;Functional mobility training;Neuromuscular re-education;Balance training;Therapeutic exercise;Therapeutic activities;Patient/family education;Manual techniques;Energy conservation;Aquatic Therapy;Electrical Stimulation;Passive range of motion;Dry needling;Taping    PT Next Visit Plan  progress strengthening and postural mechanics    PT Home Exercise Plan  No updates this day    Consulted and Agree with Plan of Care  Patient       Patient will benefit from skilled therapeutic intervention in order to improve the following  deficits and impairments:  Abnormal gait, Cardiopulmonary status limiting activity, Decreased activity tolerance, Decreased balance, Decreased endurance, Decreased range of motion, Difficulty walking, Decreased strength, Hypomobility, Decreased mobility, Impaired perceived functional ability, Impaired flexibility, Postural dysfunction, Obesity, Improper body mechanics, Pain  Visit Diagnosis: Other abnormalities of gait and mobility  Chronic low back pain, unspecified back pain laterality, unspecified whether sciatica present  Abnormal posture  Muscle weakness (generalized)     Problem List Patient Active Problem List   Diagnosis Date Noted  . Edema 04/16/2019  . Postoperative wound infection 03/13/2019  . Seroma of breast 03/11/2019  . Age-related osteoporosis without current pathological fracture 02/27/2019  . Malignant neoplasm of central portion of right breast in female, estrogen receptor positive ([place] 02/12/2019  . Genetic testing 01/31/2019  . Malignant neoplasm of upper-inner quadrant of right breast in female, estrogen receptor positive ([place] 01/15/2019  . Long term current use of opiate analgesic 12/02/2018  . Tympanic membrane central perforation, left 11/04/2018  . DDD (degenerative disc disease), lumbosacral 10/10/2018  . Abnormal MRI, lumbar spine (08/29/2018) 10/10/2018  . 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 Lumbar nerve root impingement (Bilateral: L4 & L5) (L>R: L5) 10/10/2018  . Lumbar Spinal instability of L4-5 10/10/2018  . Lumbar facet hypertrophy 10/10/2018  . COPD (chronic obstructive pulmonary disease) with emphysema ([place] 08/28/2018  . Elevated rheumatoid factor 08/13/2018  . Elevated C-reactive protein (CRP) 08/12/2018  . Elevated sed rate 08/12/2018  . Lumbar facet syndrome (Bilateral) 08/12/2018  . Back pain with left-sided radiculopathy 08/12/2018  . DDD (degenerative disc disease), lumbar 08/12/2018   . Chronic lower extremity pain (Primary Area of Pain) (Bilateral) (L>R) 07/23/2018  . Chronic low back pain (Secondary Area of Pain) (Bilateral) (L>R) w/ sciatica (Bilateral) 07/23/2018  . Chronic pain syndrome 07/23/2018  . Pharmacologic therapy 07/23/2018  . Disorder of skeletal system 07/23/2018  . Problems influencing health status 07/23/2018  . Chronic sacroiliac joint pain [facility]) (Bilateral) (L>R) 07/23/2018  . Status post lumboperitoneal shunt placement 02/26/2018  . 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 Tympanic membrane perforation 05/30/2016  . PMB (postmenopausal bleeding) 04/26/2016  . Rash 12/27/2015  . 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 Plantar fasciitis of foot (Left) 05/26/2015  . Spondylosis of lumbar region without myelopathy or radiculopathy 05/26/2015  . Lumbar foraminal stenosis (L3-4, L4-5, L5-S1) (L>R) 05/26/2015  . Spinal stenosis of lumbar region with neurogenic claudication 05/26/2015  . Encounter for screening mammogram for malignant neoplasm of breast 12/02/2014  . Goiter 12/02/2014  . Chronic idiopathic gout of foot (Right) 07/06/2014  . Biceps tendonitis (Right) 05/19/2014  . Shoulder pain (Right) 05/19/2014  . Facial weakness 05/05/2014  . Post herpetic neuralgia 05/05/2014  . Chronic dysfunction of left  eustachian tube 03/24/2014  . Osteopenia 03/13/2014  . Lumbar Grade 1 Anterolisthesis of L3/4 (23m) & L4/5 (4-160m(w/ Dynamic instability) 03/13/2014  . Family history of colon cancer 12/02/2013  . Dyshidrotic eczema 11/17/2013  . Diastasis recti 04/08/2013  . Facial nerve paresis 07/31/2012  . Blepharospasm 05/08/2012  . 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 Obesity 03/28/2012  . Hemoptysis 12/22/2011  . Neuropathic postherpetic trigeminal neuralgia 12/12/2011  . Allergic rhinitis, mild 10/12/2011  .  Disequilibrium syndrome 09/27/2011  . Chronic bronchitis (HC[place]09/24/2012  . Essential hypertension 05/24/2011  . Type 2 diabetes mellitus without complications ([id number]. Pulmonary nodules 05/05/2011   MaJanna ArchPT, DPT   05/26/2019, 4:46 PM  [place] REH[facility] HospitalERVICES [id number] [id number]: [phone] Fax:  [phone]: LiCarrolyn HilmesRN: [id number] of Birth: 02/1951-10-30

## 2019-05-28 ENCOUNTER — Other Ambulatory Visit: Payer: Self-pay

## 2019-05-28 ENCOUNTER — Ambulatory Visit: Payer: Medicare Other | Admitting: Occupational Therapy

## 2019-05-28 ENCOUNTER — Ambulatory Visit: Payer: Medicare Other

## 2019-05-28 DIAGNOSIS — R293 Abnormal posture: Secondary | ICD-10-CM

## 2019-05-28 DIAGNOSIS — I972 Postmastectomy lymphedema syndrome: Secondary | ICD-10-CM | POA: Diagnosis not present

## 2019-05-28 DIAGNOSIS — R2689 Other abnormalities of gait and mobility: Secondary | ICD-10-CM

## 2019-05-28 DIAGNOSIS — M6281 Muscle weakness (generalized): Secondary | ICD-10-CM

## 2019-05-28 DIAGNOSIS — M545 Low back pain, unspecified: Secondary | ICD-10-CM

## 2019-05-28 DIAGNOSIS — G8929 Other chronic pain: Secondary | ICD-10-CM

## 2019-05-28 NOTE — Therapy (Signed)
[place] MAIN [facility] SERVICES [address] [place], [place], [address] Phone: [phone]   Fax:  [phone]  Occupational Therapy Treatment  Patient Details  Name: Marshe Shrestha MRN: [id number] Date of Birth: 1951-07-22 Referring Provider (OT): Humberto Leep, MD   Encounter Date: 05/28/2019  OT End of Session - 05/28/19 1628    Visit Number  28    Number of Visits  36    Date for OT Re-Evaluation  08/20/19    OT Start Time  0205    OT Stop Time  0305    OT Time Calculation (min)  60 min    Activity Tolerance  Patient tolerated treatment well;No increased pain    Behavior During Therapy  WFL for tasks assessed/performed       Past Medical History:  Diagnosis Date  . COPD (chronic obstructive pulmonary disease) ([place])   . Depression 1974  . Diabetes mellitus without complication ([place])   . Hypertension   . Migraine   . 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 LUMBAR PERITONEAL SHUNT    . TYMPANOSTOMY TUBE PLACEMENT      There were no vitals filed for this visit.  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 01/30/19 partial mastectomy. Margins clear. 0/2 LN  negative for  disease. actinic keritosis,DDD, DM, obesity, thyroid nodules, pulmonary nodules, chronic back pain, HTN, COPD, hx depression    Limitations  pain and swelling in R axilla and arm, chronic back pain, muscle weakness, impaired balance, decreased RUE AROM, abnormal gait, difficulty walking, impaired functional mobility and transfers,    Repetition  Increases Symptoms    Special Tests  -Stemmer at MPs bilaterally    Patient Stated  Goals  relduce pain and improve movement  in my R arm so I can do things I need to do. reduce risk of lymphedema    Pain Onset  More than a month ago    Pain Onset  More than a month ago                   OT Treatments/Exercises (OP) - 05/28/19 0001      ADLs   ADL Education Given  Yes      Manual Therapy   Manual Therapy  Edema management;Compression Bandaging;Manual Lymphatic Drainage (MLD);Soft tissue mobilization;Taping    Soft tissue mobilization  fibrosis techniques to dorsal hand    Manual Lymphatic Drainage (MLD)  MLD to RUE/RUQ as established using cntalateral AAA    Compression Bandaging  RUE and hand wrapped using multilayer short stretch wraps over Rosidal foam and OTS cmpression glove. Kinesiotape ( 2" wide fan cuts on end  attached over metacarpals and anchored on wrist extensor muscle bellies to facilitate passive skin stretch to decongest stubborn hand swelling             OT Education - 05/28/19 1628    Education Details  Continued skilled Pt/caregiver education  And LE ADL training throughout visit. Emphasis on Kinesiotaping for lymphatic dranage of hand and forearm    Person(s) Educated  Patient    Methods  Demonstration;Explanation;Tactile cues;Verbal cues;Handout  Comprehension  Verbalized understanding;Returned demonstration          OT Long Term Goals - 05/22/19 1706      OT LONG TERM GOAL #1   Title  Pt independent with modified lymphedema precautions and prevention strategies to limit LE progression.    Baseline  Max A    Time  4    Period  Days    Status  Achieved      OT LONG TERM GOAL #2   Title  Pt will be independent with lymphedema self-care home program and AWS ther ex to improve functional performance of basic and instrumental ADLs by DC.    Baseline  Max A    Time  12    Period  Weeks    Status  Achieved      OT LONG TERM GOAL #3   Title  Pt wil report 0/10 pain with RUE functional use ( reaching, lifting, carrying,  dressing, dressing, etc) to achieve independent , pain free ADLs performance.    Baseline  Max A 05/05/19: Reporting 4/10 lymphadema pain now combined with AWS related pain. 05/22/19 AWS pain resolved. Pt now experiencing pain in hand and distal FA associated with recent onset of lymphedema.    Time  12    Period  Weeks    Status  Partially Met      OT LONG TERM GOAL #4   Title  Pt will be able to apply gradient compression wraps using correct techniques independently to control UE swelling and limit LE progression.    Baseline  Max A    Time  1    Period  Weeks    Status  Achieved            Plan - 05/28/19 1629    Clinical Impression Statement  Hand swelling visibly the same as last  visit with decreased swelling dorsally and palmarly as evidenced by visible  skin wrinkles. Swelling over MPs remains most stubborn. Pt tolerated MLD well. Compression wraps applied over Kinesiotape as established. Cont as per POC. Call into DME vendor asking on status opf garments.    OT Occupational Profile and History  Comprehensive Assessment- Review of records and extensive additional review of physical, cognitive, psychosocial history related to current functional performance    Occupational performance deficits (Please refer to evaluation for details):  ADL's;IADL's;Social Participation;Leisure;Rest and Sleep;Work    Marketing executive / Function / Physical Skills  ADL;Decreased knowledge of precautions;ROM;UE functional use;Decreased knowledge of use of DME;Edema;Skin integrity;Pain;IADL    Rehab Potential  Good    Clinical Decision Making  Several treatment options, min-mod task modification necessary    Comorbidities Affecting Occupational Performance:  Presence of comorbidities impacting occupational performance    Comorbidities impacting occupational performance description:  see SUBJUECTIVE    Modification or Assistance to Complete Evaluation   Min-Moderate modification of tasks or assist with  assess necessary to complete eval    OT Frequency  2x / week    OT Duration  12 weeks    OT Treatment/Interventions  Self-care/ADL training;Therapeutic exercise;Manual lymph drainage;Patient/family education;Therapeutic activities;DME and/or AE instruction;Manual Therapy;Other (comment)   consider fitting with prophylactic cmopression arm sleeve   Plan  BUE AROM ther ex to reduce axillary cording- 2 sets of 10 2-3 x daily    Consulted and Agree with Plan of Care  Patient       Patient will benefit from skilled therapeutic intervention in order to improve the following deficits and impairments:  Body Structure / Function / Physical Skills: ADL, Decreased knowledge of precautions, ROM, UE functional use, Decreased knowledge of use of DME, Edema, Skin integrity, Pain, IADL       Visit Diagnosis: Postmastectomy lymphedema syndrome    Problem List Patient Active Problem List   Diagnosis Date Noted  . Edema 04/16/2019  . Postoperative wound infection 03/13/2019  . Seroma of breast 03/11/2019  . Age-related osteoporosis without current pathological fracture 02/27/2019  . Malignant neoplasm of central portion of right breast in female, estrogen receptor positive ([place]) 02/12/2019  . Genetic testing 01/31/2019  . Malignant neoplasm of upper-inner quadrant of right breast in female, estrogen receptor positive ([place]) 01/15/2019  . Long term current use of opiate analgesic 12/02/2018  . Tympanic membrane central perforation, left 11/04/2018  . DDD (degenerative disc disease), lumbosacral 10/10/2018  . Abnormal MRI, lumbar spine (08/29/2018) 10/10/2018  . 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 Lumbar nerve root impingement (Bilateral: L4 & L5) (L>R: L5) 10/10/2018  . Lumbar Spinal instability of L4-5 10/10/2018  . Lumbar facet hypertrophy 10/10/2018  . COPD (chronic obstructive pulmonary disease) with emphysema ([place]) 08/28/2018  . Elevated  rheumatoid factor 08/13/2018  . Elevated C-reactive protein (CRP) 08/12/2018  . Elevated sed rate 08/12/2018  . Lumbar facet syndrome (Bilateral) 08/12/2018  . Back pain with left-sided radiculopathy 08/12/2018  . DDD (degenerative disc disease), lumbar 08/12/2018  . Chronic lower extremity pain (Primary Area of Pain) (Bilateral) (L>R) 07/23/2018  . Chronic low back pain (Secondary Area of Pain) (Bilateral) (L>R) w/ sciatica (Bilateral) 07/23/2018  . Chronic pain syndrome 07/23/2018  . Pharmacologic therapy 07/23/2018  . Disorder of skeletal system 07/23/2018  . Problems influencing health status 07/23/2018  . Chronic sacroiliac joint pain [facility] Area of Pain) (Bilateral) (L>R) 07/23/2018  . Status post lumboperitoneal shunt placement 02/26/2018  . 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 Tympanic membrane perforation 05/30/2016  . PMB (postmenopausal bleeding) 04/26/2016  . Rash 12/27/2015  . 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 Plantar fasciitis of foot (Left) 05/26/2015  . Spondylosis of lumbar region without myelopathy or radiculopathy 05/26/2015  . Lumbar foraminal stenosis (L3-4, L4-5, L5-S1) (L>R) 05/26/2015  . Spinal stenosis of lumbar region with neurogenic claudication 05/26/2015  . Encounter for screening mammogram for malignant neoplasm of breast 12/02/2014  . Goiter 12/02/2014  . Chronic idiopathic gout of foot (Right) 07/06/2014  . Biceps tendonitis (Right) 05/19/2014  . Shoulder pain (Right) 05/19/2014  . Facial weakness 05/05/2014  . Post herpetic neuralgia 05/05/2014  . Chronic dysfunction of left eustachian tube 03/24/2014  . Osteopenia 03/13/2014  . Lumbar Grade 1 Anterolisthesis of L3/4 (14m) & L4/5 (4-164m(w/ Dynamic instability) 03/13/2014  . Family history of colon cancer 12/02/2013  . Dyshidrotic eczema 11/17/2013  . Diastasis recti 04/08/2013  . Facial nerve paresis 07/31/2012  .  Blepharospasm 05/08/2012  . 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 Obesity 03/28/2012  . Hemoptysis 12/22/2011  . Neuropathic postherpetic trigeminal neuralgia 12/12/2011  . Allergic rhinitis, mild 10/12/2011  . Disequilibrium syndrome 09/27/2011  . Chronic bronchitis ([place]09/24/2012  . Essential hypertension 05/24/2011  . Type 2 diabetes mellitus without complications ([id number]. Pulmonary nodules 05/05/2011    ThAndrey SpearmanMS, OTR/L, CLLac/[facility]9/16/20 4:33 PM  CoNorth Valley StreamAIN REVanguard Asc LLC Dba Vanguard Surgical CenterERVICES [id number]: [phone]  Fax:  [phone]  Name: Jonise Weightman MRN: [id number] Date of Birth: 1951/06/02

## 2019-05-28 NOTE — Therapy (Signed)
[place] MAIN [facility] SERVICES [address] [place], [place], [address] Phone: [phone]   Fax:  [phone]  Physical Therapy Treatment  Patient Details  Name: Susan Callahan MRN: [id number] Date of Birth: April 17, 1951 Referring Provider (PT): Francesco Sor   Encounter Date: 05/28/2019  PT End of Session - 05/28/19 1641    Visit Number  [id number]    Number of Visits  5    Date for PT Re-Evaluation  06/03/19    Authorization Type  5/10 PN on 05/06/19    PT Start Time  1518    PT Stop Time  1601    PT Time Calculation (min)  43 min    Equipment Utilized During Treatment  Gait belt    Activity Tolerance  Patient tolerated treatment well    Behavior During Therapy  [facility] for tasks assessed/performed       Past Medical History:  Diagnosis Date  . COPD (chronic obstructive pulmonary disease) ([place])   . Depression 1974  . Diabetes mellitus without complication ([place])   . Hypertension   . Migraine   . 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 LUMBAR PERITONEAL SHUNT    . TYMPANOSTOMY TUBE PLACEMENT      There were no vitals filed for this visit.  Subjective Assessment - 05/28/19 1537    Subjective  Patient reports feeling stiff from laying down during wrapping of R arm. Has been compliant with HEP.    Pertinent History  Pain began in 2007 and has gradually got worse and has progressed to nonstop. Patient states pain last all day long, everyday with no change in symptoms. Medication helps briefly. States she has difficulty with showering and standing. States she has to stand with her knee bent due to pulling sensation in legs. Pain begins immediately when stands and begins to walk. Pain feels better when she bends her knees when standing. Pain became constant and non stop around Dec 2018. Denies bowel or bladder changes, unexplained weakness, or unexpected weight loss/gain. She currently goes to  pulmonary rehab 3x a week, but has increased pain with treadmill walking. Will walk with cane on occasion around house especially at night due to occasional dizziness and vision impairments in medical history. States she also has difficulty with carrying groceries up steps due to low back pain. She would like to be able to walk better and have less pain to be less reliant on medication.     Limitations  Lifting;Standing;Walking;House hold activities    How long can you stand comfortably?  pain begins immediately     How long can you walk comfortably?  pain begins immediately     Patient Stated Goals  walk better, have less pain    Currently in Pain?  Yes    Pain Score  3     Pain Location  Back    Pain Orientation  Lower    Pain Descriptors / Indicators  Aching    Pain Type  Chronic pain    Pain Onset  More than a month ago    Pain Frequency  Intermittent            manual  Roller to lumbar and thoracic paraspinals x 3 minutes.  Calf rollout with "the stick" 2 minutes each calf  j swipe mobilization for postural alignment 10 seconds x 3 each level.    Ther-ex: cueing for sequencing, positioning and  muscle activation   Seated Posterior pelvic tilts x15. tactile and verbal cue for appropriate technique 5 seconds with hold, improved alignment Standing calf stretch on treadmill 30 seconds x 2 trials each  Seated TrA activation with upright posture marching 10x each LE Seated large swiss ball TrA activation forward rollout forward, diagonal, 10x each direction, Seated on swiss ball: TrA activation 8x 3 second holds Seated on swiss ball: heel raises 10x each LE with TrA activation    Treadmill 2.0 mph, alternating between 1-2% incline for interval training 7 minutes. Cueing for abdominal activation and sequencing of muscle activation. Able to maintain heel strike and neutral spinal alignment with BUE support.     Pt educated throughout session about proper posture and technique with  exercises. Improved exercise technique, movement at target joints, use of target muscles after min to mod verbal, visual, tactile cues                     PT Education - 05/28/19 1538    Education Details  exercise technique, body mechanics    Person(s) Educated  Patient    Methods  Explanation;Demonstration;Tactile cues;Verbal cues    Comprehension  Verbalized understanding;Returned demonstration;Verbal cues required;Tactile cues required       PT Short Term Goals - 05/06/19 1738      PT SHORT TERM GOAL #1   Title  Patient will be independent with completion of HEP to improve ability to complete functional tasks and functional ADLs.    Baseline  10/23: will give HEP next session 11/19: HEP compliance    Time  2    Period  Weeks    Status  Achieved      PT SHORT TERM GOAL #2   Title  Patient will report pain score less than 4/10 on VAS to demonstrate reduction of pain levels with functional tasks.     Baseline  10/23: 6/10 11/19: 5/10 12/12: 9/10 1/10: 7/10  2/11: 3/10     Time  2    Period  Weeks    Status  Achieved        PT Long Term Goals - 05/06/19 1738      PT LONG TERM GOAL #1   Title  Patient will report pain score of <3/10 on VAS for decreased pain levels and ease with functional activities.    Baseline  10/23: 6/10 11/19: 5/10 12/12 9/10 1/10: 7/10 2/11: 3/10 3/9: 6/10  5/26: 8/10 03/17/19: 7/10 8/3: 5/10 8/25: 4/10    Time  4    Period  Weeks    Status  Partially Met    Target Date  06/03/19      PT LONG TERM GOAL #2   Title  Patient will be able to ambulate on treadmill for 10mnutes without increase in back/leg pain demonstrating improved community ambulation and treadmill walking at pulmonary rehab    Baseline  10/23: 024mutes increases pain 11/19: 47m42mtes 12/12: has not had the chance to perform 1/10: was able to do 5 minutes without pain 2/11: able to do 5 minutes then had to bend over 3/9: 5 minutes 30 seconds at 2.0 mphs 5/26: had to walk  10 minutes at cancer center with extreme pain but was able to perform 7/6: walks 10 minutes with increase of pain by 2 points 8/3: able to perform with bent over posture    Time  4    Period  Weeks    Status  Achieved  PT LONG TERM GOAL #3   Title  Patient will have decreased modified oswestry score <50% for improved ability to complete household tasks with less pain levels.     Baseline  10/23: 62% 11/19: 42%      Time  4    Period  Weeks    Status  Achieved      PT LONG TERM GOAL #4   Title  Patient will score 4+/5 on BLE MMT to demonstrate improved glute strength and ability to carry groceries with increased ease up/down stairs at home.    Baseline  10/23: 4-/5 grossly 11/19: 4/5 grossly; hamstrings 4-/5 12/12: 4/5 gross 1/10: 4/5 gross 2/11: gross 4+/5 with hamstrings and abuductors 4-/5 3/9: gross 4+/5 hamstrings and abductors 4-/5 5/27: grossly 3+/5 with pain with LLE movements 7/6: grossly 4-/5 with pain with LLE movement 8/3: deferred due to swelling/pain 8/25: grossly 4/5 without pain    Time  4    Period  Weeks    Status  Partially Met    Target Date  06/03/19      PT LONG TERM GOAL #5   Title  Patient will have decreased modified oswestry score <35% for improved ability to complete household tasks with less pain levels.     Baseline  11/19: 42% 12/12: 52% 1/10: 48%  2/11: 30%     Time  4    Period  Weeks    Status  Achieved      PT LONG TERM GOAL #6   Title  Patient will be able to make and/or strip the bed without pain and without needing rest breaks to perform iADLs independently.     Baseline  2/11: pain limits ability to perform, ~4 breaks required 3/9: takes her time, 3 breaks 5/27: very painful still 7/6: requires only one rest break 8/3: able to perform with +2 pain increase 8/25: no rest breaks required, increased pain 4 points    Time  4    Period  Weeks    Status  Partially Met    Target Date  06/03/19      PT LONG TERM GOAL #7   Title  Patient will have  decreased modified oswestry score <20% for improved ability to complete household tasks with less pain levels.     Baseline  2/11: 30%  3/9: 30% 5/27: 63% 7/6: 38% 8/3: 42% 8/25: 36%    Time  4    Period  Weeks    Status  Partially Met    Target Date  06/03/19      PT LONG TERM GOAL #8   Title  Patient will tolerate >10 minutes of upright postural positioning of trunk with active mobility with <2 degrees VAS increase.    Baseline  8/3: unable to tolerate 10  minutes 8/25: tolerates 5 minutes    Time  4    Period  Weeks    Status  Partially Met    Target Date  06/03/19            Plan - 05/28/19 1642    Clinical Impression Statement  Patient presents with good motivation to today's session. Has improved control of pelvis and lumbar spine with decreased Increased progression of abdominal strengthening performed with patient challenged maintaining stability on swiss ball. Patient will continue to benefit from skilled physical therapy to improve strength, to reduce pain levels with ADLs, and to improve quality of life.    Rehab Potential  Fair  Clinical Impairments Affecting Rehab Potential  (+) motivation (-) chronicity of symptoms    PT Frequency  2x / week    PT Duration  4 weeks    PT Treatment/Interventions  ADLs/Self Care Home Management;Cryotherapy;Moist Heat;Traction;Iontophoresis 65m/ml Dexamethasone;Ultrasound;Gait training;Stair training;Functional mobility training;Neuromuscular re-education;Balance training;Therapeutic exercise;Therapeutic activities;Patient/family education;Manual techniques;Energy conservation;Aquatic Therapy;Electrical Stimulation;Passive range of motion;Dry needling;Taping    PT Next Visit Plan  progress strengthening and postural mechanics    PT Home Exercise Plan  No updates this day    Consulted and Agree with Plan of Care  Patient       Patient will benefit from skilled therapeutic intervention in order to improve the following deficits and  impairments:  Abnormal gait, Cardiopulmonary status limiting activity, Decreased activity tolerance, Decreased balance, Decreased endurance, Decreased range of motion, Difficulty walking, Decreased strength, Hypomobility, Decreased mobility, Impaired perceived functional ability, Impaired flexibility, Postural dysfunction, Obesity, Improper body mechanics, Pain  Visit Diagnosis: Other abnormalities of gait and mobility  Chronic low back pain, unspecified back pain laterality, unspecified whether sciatica present  Abnormal posture  Muscle weakness (generalized)     Problem List Patient Active Problem List   Diagnosis Date Noted  . Edema 04/16/2019  . Postoperative wound infection 03/13/2019  . Seroma of breast 03/11/2019  . Age-related osteoporosis without current pathological fracture 02/27/2019  . Malignant neoplasm of central portion of right breast in female, estrogen receptor positive ([place] 02/12/2019  . Genetic testing 01/31/2019  . Malignant neoplasm of upper-inner quadrant of right breast in female, estrogen receptor positive ([place] 01/15/2019  . Long term current use of opiate analgesic 12/02/2018  . Tympanic membrane central perforation, left 11/04/2018  . DDD (degenerative disc disease), lumbosacral 10/10/2018  . Abnormal MRI, lumbar spine (08/29/2018) 10/10/2018  . 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 Lumbar nerve root impingement (Bilateral: L4 & L5) (L>R: L5) 10/10/2018  . Lumbar Spinal instability of L4-5 10/10/2018  . Lumbar facet hypertrophy 10/10/2018  . COPD (chronic obstructive pulmonary disease) with emphysema ([place] 08/28/2018  . Elevated rheumatoid factor 08/13/2018  . Elevated C-reactive protein (CRP) 08/12/2018  . Elevated sed rate 08/12/2018  . Lumbar facet syndrome (Bilateral) 08/12/2018  . Back pain with left-sided radiculopathy 08/12/2018  . DDD (degenerative disc disease), lumbar 08/12/2018  . Chronic  lower extremity pain (Primary Area of Pain) (Bilateral) (L>R) 07/23/2018  . Chronic low back pain (Secondary Area of Pain) (Bilateral) (L>R) w/ sciatica (Bilateral) 07/23/2018  . Chronic pain syndrome 07/23/2018  . Pharmacologic therapy 07/23/2018  . Disorder of skeletal system 07/23/2018  . Problems influencing health status 07/23/2018  . Chronic sacroiliac joint pain ([facility]) (Bilateral) (L>R) 07/23/2018  . Status post lumboperitoneal shunt placement 02/26/2018  . 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 Tympanic membrane perforation 05/30/2016  . PMB (postmenopausal bleeding) 04/26/2016  . Rash 12/27/2015  . 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 Plantar fasciitis of foot (Left) 05/26/2015  . Spondylosis of lumbar region without myelopathy or radiculopathy 05/26/2015  . Lumbar foraminal stenosis (L3-4, L4-5, L5-S1) (L>R) 05/26/2015  . Spinal stenosis of lumbar region with neurogenic claudication 05/26/2015  . Encounter for screening mammogram for malignant neoplasm of breast 12/02/2014  . Goiter 12/02/2014  . Chronic idiopathic gout of foot (Right) 07/06/2014  . Biceps tendonitis (Right) 05/19/2014  . Shoulder pain (Right) 05/19/2014  . Facial weakness 05/05/2014  . Post herpetic neuralgia 05/05/2014  . Chronic dysfunction of left  eustachian tube 03/24/2014  . Osteopenia 03/13/2014  . Lumbar Grade 1 Anterolisthesis of L3/4 (50m) & L4/5 (4-152m(w/ Dynamic instability) 03/13/2014  . Family history of colon cancer 12/02/2013  . Dyshidrotic eczema 11/17/2013  . Diastasis recti 04/08/2013  . Facial nerve paresis 07/31/2012  . Blepharospasm 05/08/2012  . 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 Obesity 03/28/2012  . Hemoptysis 12/22/2011  . Neuropathic postherpetic trigeminal neuralgia 12/12/2011  . Allergic rhinitis, mild 10/12/2011  .  Disequilibrium syndrome 09/27/2011  . Chronic bronchitis (HCP[place]09/24/2012  . Essential hypertension 05/24/2011  . Type 2 diabetes mellitus without complications ([id number]. Pulmonary nodules 05/05/2011   MaJanna ArchPT, DPT   05/28/2019, 4:43 PM  [place]AIN REL[facility]cERVICES [address]d[place]NC[place]hone: [phone] Fax:  [phone]: Susan PasionRN: [id number] of Birth: 02/1951-10-19

## 2019-05-29 ENCOUNTER — Ambulatory Visit: Payer: Medicare Other | Admitting: Occupational Therapy

## 2019-05-29 ENCOUNTER — Other Ambulatory Visit: Payer: Self-pay

## 2019-05-29 DIAGNOSIS — I972 Postmastectomy lymphedema syndrome: Secondary | ICD-10-CM | POA: Diagnosis not present

## 2019-05-29 NOTE — Therapy (Signed)
[place] MAIN [facility] SERVICES [address] [place], [place], [address] Phone: [phone]   Fax:  [phone]  Occupational Therapy Treatment  Patient Details  Name: Susan Callahan MRN: [id number] Date of Birth: 06-22-1951 Referring Provider (OT): Humberto Leep, MD   Encounter Date: 05/29/2019  OT End of Session - 05/29/19 1545    Visit Number  29    Number of Visits  36    Date for OT Re-Evaluation  08/20/19    OT Start Time  0120    OT Stop Time  0220    OT Time Calculation (min)  60 min    Activity Tolerance  Patient tolerated treatment well;No increased pain    Behavior During Therapy  WFL for tasks assessed/performed       Past Medical History:  Diagnosis Date  . COPD (chronic obstructive pulmonary disease) ([place])   . Depression 1974  . Diabetes mellitus without complication ([place])   . Hypertension   . Migraine   . 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 LUMBAR PERITONEAL SHUNT    . TYMPANOSTOMY TUBE PLACEMENT      There were no vitals filed for this visit.  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 Pt reports she feels depressed today. "First it was my back, and my vestibular problem, then breast cancer and now lymphedema. 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 01/30/19 partial mastectomy. Margins clear. 0/2 LN  negative for  disease. actinic keritosis,DDD, DM, obesity, thyroid nodules, pulmonary nodules, chronic back pain, HTN, COPD, hx depression    Limitations  pain and swelling in R axilla and arm, chronic back pain, muscle weakness, impaired balance, decreased RUE AROM, abnormal gait, difficulty walking, impaired functional mobility and transfers,    Repetition   Increases Symptoms    Special Tests  -Stemmer at MPs bilaterally    Patient Stated Goals  relduce pain and improve movement  in my R arm so I can do things I need to do. reduce risk of lymphedema    Pain Onset  More than a month ago    Pain Onset  More than a month ago                   OT Treatments/Exercises (OP) - 05/29/19 0001      ADLs   ADL Education Given  Yes      Manual Therapy   Manual Therapy  Edema management;Manual Lymphatic Drainage (MLD);Compression Bandaging;Taping    Manual therapy comments  Kinesiotape to dorsal and volar hand, wrost and forearm using 4 finger fan cuts and anchoring at proximal FA under custom fabricated glove in effort to passivly stretch skin       to decongest hamnd.    Soft tissue mobilization  fibrosis techniques to dorsal hand    Manual Lymphatic Drainage (MLD)  MLD to RUE/RUQ as established using cntalateral AAA    Compression Bandaging  RUE and hand wrapped using multilayer short stretch wraps over Rosidal foam and OTS cmpression glove. Kinesiotape ( 2" wide fan cuts on end  attached over metacarpals and anchored on wrist extensor muscle bellies to facilitate passive skin stretch to decongest stubborn hand swelling  OT Education - 05/29/19 1545    Education Details  Pt edu throughout session for coping strategies for chronic illness and disabiity    Person(s) Educated  Patient    Methods  Demonstration;Explanation;Tactile cues;Verbal cues;Handout    Comprehension  Verbalized understanding;Returned demonstration          OT Long Term Goals - 05/22/19 1706      OT LONG TERM GOAL #1   Title  Pt independent with modified lymphedema precautions and prevention strategies to limit LE progression.    Baseline  Max A    Time  4    Period  Days    Status  Achieved      OT LONG TERM GOAL #2   Title  Pt will be independent with lymphedema self-care home program and AWS ther ex to improve functional performance of  basic and instrumental ADLs by DC.    Baseline  Max A    Time  12    Period  Weeks    Status  Achieved      OT LONG TERM GOAL #3   Title  Pt wil report 0/10 pain with RUE functional use ( reaching, lifting, carrying, dressing, dressing, etc) to achieve independent , pain free ADLs performance.    Baseline  Max A 05/05/19: Reporting 4/10 lymphadema pain now combined with AWS related pain. 05/22/19 AWS pain resolved. Pt now experiencing pain in hand and distal FA associated with recent onset of lymphedema.    Time  12    Period  Weeks    Status  Partially Met      OT LONG TERM GOAL #4   Title  Pt will be able to apply gradient compression wraps using correct techniques independently to control UE swelling and limit LE progression.    Baseline  Max A    Time  1    Period  Weeks    Status  Achieved            Plan - 05/29/19 1546    Clinical Impression Statement  Rhand swelling unchanged since last visit. Lymphatic congestion      is slow to respond at this most distal level typiccally, and Ms. [place] presentation is in keeping with that experiwence. Pt feeling discouraged re lymphedema after breast cancer Rx   today. She has reached out to [place] for assistance. Cont as per OC.    OT Occupational Profile and History  Comprehensive Assessment- Review of records and extensive additional review of physical, cognitive, psychosocial history related to current functional performance    Occupational performance deficits (Please refer to evaluation for details):  ADL's;IADL's;Social Participation;Leisure;Rest and Sleep;Work    Marketing executive / Function / Physical Skills  ADL;Decreased knowledge of precautions;ROM;UE functional use;Decreased knowledge of use of DME;Edema;Skin integrity;Pain;IADL    Rehab Potential  Good    Clinical Decision Making  Several treatment options, min-mod task modification necessary    Comorbidities Affecting Occupational  Performance:  Presence of comorbidities impacting occupational performance    Comorbidities impacting occupational performance description:  see SUBJUECTIVE    Modification or Assistance to Complete Evaluation   Min-Moderate modification of tasks or assist with assess necessary to complete eval    OT Frequency  2x / week    OT Duration  12 weeks    OT Treatment/Interventions  Self-care/ADL training;Therapeutic exercise;Manual lymph drainage;Patient/family education;Therapeutic activities;DME and/or AE instruction;Manual Therapy;Other (comment)   consider fitting with prophylactic cmopression arm sleeve   Plan  BUE AROM ther ex to reduce axillary cording- 2 sets of 10 2-3 x daily    Consulted and Agree with Plan of Care  Patient       Patient will benefit from skilled therapeutic intervention in order to improve the following deficits and impairments:   Body Structure / Function / Physical Skills: ADL, Decreased knowledge of precautions, ROM, UE functional use, Decreased knowledge of use of DME, Edema, Skin integrity, Pain, IADL       Visit Diagnosis: Postmastectomy lymphedema syndrome    Problem List Patient Active Problem List   Diagnosis Date Noted  . Edema 04/16/2019  . Postoperative wound infection 03/13/2019  . Seroma of breast 03/11/2019  . Age-related osteoporosis without current pathological fracture 02/27/2019  . Malignant neoplasm of central portion of right breast in female, estrogen receptor positive ([place]) 02/12/2019  . Genetic testing 01/31/2019  . Malignant neoplasm of upper-inner quadrant of right breast in female, estrogen receptor positive ([place]) 01/15/2019  . Long term current use of opiate analgesic 12/02/2018  . Tympanic membrane central perforation, left 11/04/2018  . DDD (degenerative disc disease), lumbosacral 10/10/2018  . Abnormal MRI, lumbar spine (08/29/2018) 10/10/2018  . 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 Lumbar nerve root impingement (Bilateral: L4 & L5) (L>R: L5) 10/10/2018  . Lumbar Spinal instability of L4-5 10/10/2018  . Lumbar facet hypertrophy 10/10/2018  . COPD (chronic obstructive pulmonary disease) with emphysema ([place]) 08/28/2018  . Elevated rheumatoid factor 08/13/2018  . Elevated C-reactive protein (CRP) 08/12/2018  . Elevated sed rate 08/12/2018  . Lumbar facet syndrome (Bilateral) 08/12/2018  . Back pain with left-sided radiculopathy 08/12/2018  . DDD (degenerative disc disease), lumbar 08/12/2018  . Chronic lower extremity pain (Primary Area of Pain) (Bilateral) (L>R) 07/23/2018  . Chronic low back pain (Secondary Area of Pain) (Bilateral) (L>R) w/ sciatica (Bilateral) 07/23/2018  . Chronic pain syndrome 07/23/2018  . Pharmacologic therapy 07/23/2018  . Disorder of skeletal system 07/23/2018  . Problems influencing health status 07/23/2018  . Chronic sacroiliac joint pain [facility] Area of Pain) (Bilateral) (L>R) 07/23/2018  . Status post lumboperitoneal shunt placement 02/26/2018  . 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 Tympanic membrane perforation 05/30/2016  . PMB (postmenopausal bleeding) 04/26/2016  . Rash 12/27/2015  . 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 Plantar fasciitis of foot (Left) 05/26/2015  . Spondylosis of lumbar region without myelopathy or radiculopathy 05/26/2015  . Lumbar foraminal stenosis (L3-4, L4-5, L5-S1) (L>R) 05/26/2015  . Spinal stenosis of lumbar region with neurogenic claudication 05/26/2015  . Encounter for screening mammogram for malignant neoplasm of breast 12/02/2014  . Goiter 12/02/2014  . Chronic idiopathic gout of foot (Right) 07/06/2014  . Biceps tendonitis (Right) 05/19/2014  . Shoulder pain (Right) 05/19/2014  . Facial weakness 05/05/2014  . Post herpetic neuralgia 05/05/2014  . Chronic dysfunction of left eustachian tube 03/24/2014  . Osteopenia  03/13/2014  . Lumbar Grade 1 Anterolisthesis of L3/4 (52m) & L4/5 (4-135m(w/ Dynamic instability) 03/13/2014  . Family history of colon cancer 12/02/2013  . Dyshidrotic eczema 11/17/2013  . Diastasis recti 04/08/2013  . Facial nerve paresis 07/31/2012  . Blepharospasm 05/08/2012  . 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 Obesity 03/28/2012  . Hemoptysis 12/22/2011  . Neuropathic postherpetic trigeminal neuralgia 12/12/2011  . Allergic rhinitis, mild 10/12/2011  . Disequilibrium syndrome 09/27/2011  . [place] bronchitis ([place]/24/2012  .  Essential hypertension 05/24/2011  . Type 2 diabetes mellitus without complications ([place]) 46/56/8127  . Pulmonary nodules 05/05/2011    Andrey Spearman, MS, OTR/L, [facility] 05/29/19 3:50 PM  [place] MAIN [facility] SERVICES [address] [place], [place], [address] Phone: [phone]   Fax:  [phone]  Name: Shaia Porath MRN: [id number] Date of Birth: July 04, 1951

## 2019-06-02 ENCOUNTER — Other Ambulatory Visit: Payer: Self-pay

## 2019-06-02 ENCOUNTER — Ambulatory Visit: Payer: Medicare Other | Admitting: Occupational Therapy

## 2019-06-02 ENCOUNTER — Ambulatory Visit: Payer: Medicare Other

## 2019-06-02 DIAGNOSIS — G8929 Other chronic pain: Secondary | ICD-10-CM

## 2019-06-02 DIAGNOSIS — I972 Postmastectomy lymphedema syndrome: Secondary | ICD-10-CM

## 2019-06-02 DIAGNOSIS — M545 Low back pain, unspecified: Secondary | ICD-10-CM

## 2019-06-02 DIAGNOSIS — R293 Abnormal posture: Secondary | ICD-10-CM

## 2019-06-02 DIAGNOSIS — R2689 Other abnormalities of gait and mobility: Secondary | ICD-10-CM

## 2019-06-02 DIAGNOSIS — M6281 Muscle weakness (generalized): Secondary | ICD-10-CM

## 2019-06-02 NOTE — Therapy (Signed)
[place] MAIN [facility] SERVICES [address] [place], [place], [address] Phone: [phone]   Fax:  [phone]  Occupational Therapy Treatment  Patient Details  Name: Susan Callahan MRN: [id number] Date of Birth: 01/04/1951 Referring Provider (OT): Humberto Leep, MD   Encounter Date: 06/02/2019  OT End of Session - 06/02/19 1607    Visit Number  30    Number of Visits  36    Date for OT Re-Evaluation  08/20/19    OT Start Time  0210    OT Stop Time  0310    OT Time Calculation (min)  60 min    Activity Tolerance  Patient tolerated treatment well;No increased pain    Behavior During Therapy  WFL for tasks assessed/performed       Past Medical History:  Diagnosis Date  . COPD (chronic obstructive pulmonary disease) ([place])   . Depression 1974  . Diabetes mellitus without complication ([place])   . Hypertension   . Migraine   . 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 LUMBAR PERITONEAL SHUNT    . TYMPANOSTOMY TUBE PLACEMENT      There were no vitals filed for this visit.  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 Pt reports she started new AI today. Pt reports videotape has helped her with self wrapping between sessions.    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 01/30/19 partial mastectomy. Margins clear. 0/2 LN  negative for  disease. actinic keritosis,DDD, DM, obesity, thyroid nodules, pulmonary nodules, chronic back pain, HTN, COPD, hx depression    Limitations  pain and swelling in R axilla and arm, chronic back pain, muscle weakness, impaired balance, decreased RUE AROM, abnormal gait, difficulty walking, impaired functional mobility and transfers,    Repetition  Increases Symptoms    Special Tests  -Stemmer at MPs  bilaterally    Patient Stated Goals  relduce pain and improve movement  in my R arm so I can do things I need to do. reduce risk of lymphedema    Pain Onset  More than a month ago    Pain Onset  More than a month ago                   OT Treatments/Exercises (OP) - 06/02/19 0001      ADLs   ADL Education Given  Yes      Manual Therapy   Manual Therapy  Edema management;Manual Lymphatic Drainage (MLD);Compression Bandaging;Taping    Edema Management  repeated anatomical measurements ifor custom compression sleeve and glove initially taken on     Manual Lymphatic Drainage (MLD)  MLD to RUE/RUQ as established using cntalateral AAA    Compression Bandaging  RUE and hand wrapped using multilayer short stretch wraps over Rosidal foam and OTS cmpression glove. Kinesiotape ( 2" wide fan cuts on end  attached over metacarpals and anchored on wrist extensor muscle bellies to facilitate passive skin stretch to decongest stubborn hand swelling             OT Education - 06/02/19 1606    Education Details  Continued skilled Pt/caregiver education  And LE ADL training throughout visit. Emphasis on Kinesiotaping for lymphatic dranage of hand and forearm    Person(s) Educated  Patient    Methods  Demonstration;Explanation;Tactile cues;Verbal cues;Handout    Comprehension  Verbalized understanding;Returned demonstration          OT Long Term Goals - 06/02/19 1609      OT LONG TERM GOAL #1   Title  Pt independent with modified lymphedema precautions and prevention strategies to limit LE progression.    Baseline  Max A    Time  4    Period  Days    Status  Achieved      OT LONG TERM GOAL #2   Title  Pt will be independent with lymphedema self-care home program and AWS ther ex to improve functional performance of basic and instrumental ADLs by DC.    Baseline  Max A    Time  12    Period  Weeks    Status  Achieved      OT LONG TERM GOAL #3   Title  Pt wil report 0/10  pain with RUE functional use ( reaching, lifting, carrying, dressing, dressing, etc) to achieve independent , pain free ADLs performance.    Baseline  Max A 05/05/19: Reporting 4/10 lymphadema pain now combined with AWS related pain. 05/22/19 AWS pain resolved. Pt now experiencing pain in hand and distal FA associated with recent onset of lymphedema.    Time  12    Period  Weeks    Status  Achieved      OT LONG TERM GOAL #4   Title  Pt will be able to apply gradient compression wraps using correct techniques independently to control UE swelling and limit LE progression.    Baseline  Max A    Time  1    Period  Weeks    Status  Achieved      OT LONG TERM GOAL #5   Title  After skilled trainng Pt will be able to don and doff  appropriate compression garments and/ or devices using correct techniques and assistive devices with extra time  by end of Intensive Phase CDT for optimal LE management to limit progression over time.    Baseline  Max A    Time  12    Period  Weeks    Status  New    Target Date  --   issue date     Long Term Additional Goals   Additional Long Term Goals  Yes      OT LONG TERM GOAL #6   Title  Due to onset of RUE swelling concentrated primarily in R hand during manual therapy for cording  Pt will achieve 5%, or greater RUE  limb volume reductions during Intensive Phase CDT to  improve ADLs performances, to reduce infection risk, and to limit LE progression.    Baseline  Max A    Time  12    Period  Weeks    Status  New    Target Date  08/31/19            Plan - 06/02/19 1625    Clinical Impression Statement  Repeated anatomical measurements for custom compression sleeve and glove initially taken on 05/06/19. Funding from [facility] was recently approved, but garnebt measurements were different over time. Faxed to DME vendor after visit.  tolerated MLD and cmoression wrapping without difficulty. Discussed progress towards OT goals and added 2  goals today. Pt has achieved most goals established for cording, which has essentially resolved. At present she demonstrates slow progress towards limb volume reduction  concentrated primarily in R hand with onset of lymphedema during OT course. Pt demonstrates excellent compliance with all home program components. Cont as per POC 2 x weekly to decrease limb volume and limit LE progression for optimal functional performance in all occupational domains. See Long Term Goals for detailed gola progress.    OT Occupational Profile and History  Comprehensive Assessment- Review of records and extensive additional review of physical, cognitive, psychosocial history related to current functional performance    Occupational performance deficits (Please refer to evaluation for details):  ADL's;IADL's;Social Participation;Leisure;Rest and Sleep;Work    Marketing executive / Function / Physical Skills  ADL;Decreased knowledge of precautions;ROM;UE functional use;Decreased knowledge of use of DME;Edema;Skin integrity;Pain;IADL    Rehab Potential  Good    Clinical Decision Making  Several treatment options, min-mod task modification necessary    Comorbidities Affecting Occupational Performance:  Presence of comorbidities impacting occupational performance    Comorbidities impacting occupational performance description:  see SUBJUECTIVE    Modification or Assistance to Complete Evaluation   Min-Moderate modification of tasks or assist with assess necessary to complete eval    OT Frequency  2x / week    OT Duration  12 weeks    OT Treatment/Interventions  Self-care/ADL training;Therapeutic exercise;Manual lymph drainage;Patient/family education;Therapeutic activities;DME and/or AE instruction;Manual Therapy;Other (comment)   consider fitting with prophylactic cmopression arm sleeve   Plan  BUE AROM ther ex to reduce axillary cording- 2 sets of 10 2-3 x daily    Consulted and Agree with Plan of Care  Patient       Patient  will benefit from skilled therapeutic intervention in order to improve the following deficits and impairments:   Body Structure / Function / Physical Skills: ADL, Decreased knowledge of precautions, ROM, UE functional use, Decreased knowledge of use of DME, Edema, Skin integrity, Pain, IADL       Visit Diagnosis: Postmastectomy lymphedema syndrome    Problem List Patient Active Problem List   Diagnosis Date Noted  . Edema 04/16/2019  . Postoperative wound infection 03/13/2019  . Seroma of breast 03/11/2019  . Age-related osteoporosis without current pathological fracture 02/27/2019  . Malignant neoplasm of central portion of right breast in female, estrogen receptor positive ([place]) 02/12/2019  . Genetic testing 01/31/2019  . Malignant neoplasm of upper-inner quadrant of right breast in female, estrogen receptor positive ([place]) 01/15/2019  . Long term current use of opiate analgesic 12/02/2018  . Tympanic membrane central perforation, left 11/04/2018  . DDD (degenerative disc disease), lumbosacral 10/10/2018  . Abnormal MRI, lumbar spine (08/29/2018) 10/10/2018  . 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 Lumbar nerve root impingement (Bilateral: L4 & L5) (L>R: L5) 10/10/2018  . Lumbar Spinal instability of L4-5 10/10/2018  . Lumbar facet hypertrophy 10/10/2018  . COPD (chronic obstructive pulmonary disease) with emphysema ([place]) 08/28/2018  . Elevated rheumatoid factor 08/13/2018  . Elevated C-reactive protein (CRP) 08/12/2018  . Elevated sed rate 08/12/2018  . Lumbar facet syndrome (Bilateral) 08/12/2018  . Back pain with left-sided radiculopathy 08/12/2018  . DDD (degenerative disc disease), lumbar 08/12/2018  . Chronic lower extremity pain (Primary Area of Pain) (Bilateral) (L>R) 07/23/2018  . Chronic low back pain (Secondary Area of Pain) (Bilateral) (L>R) w/ sciatica (Bilateral) 07/23/2018  . Chronic pain syndrome 07/23/2018  .  Pharmacologic therapy 07/23/2018  . Disorder of skeletal system 07/23/2018  . Problems influencing health status 07/23/2018  . Chronic sacroiliac joint pain [facility] Area of Pain) (Bilateral) (L>R) 07/23/2018  .  Status post lumboperitoneal shunt placement 02/26/2018  . 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 Tympanic membrane perforation 05/30/2016  . PMB (postmenopausal bleeding) 04/26/2016  . Rash 12/27/2015  . 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 Plantar fasciitis of foot (Left) 05/26/2015  . Spondylosis of lumbar region without myelopathy or radiculopathy 05/26/2015  . Lumbar foraminal stenosis (L3-4, L4-5, L5-S1) (L>R) 05/26/2015  . Spinal stenosis of lumbar region with neurogenic claudication 05/26/2015  . Encounter for screening mammogram for malignant neoplasm of breast 12/02/2014  . Goiter 12/02/2014  . Chronic idiopathic gout of foot (Right) 07/06/2014  . Biceps tendonitis (Right) 05/19/2014  . Shoulder pain (Right) 05/19/2014  . Facial weakness 05/05/2014  . Post herpetic neuralgia 05/05/2014  . Chronic dysfunction of left eustachian tube 03/24/2014  . Osteopenia 03/13/2014  . Lumbar Grade 1 Anterolisthesis of L3/4 (52mm) & L4/5 (4-54mm)(w/ Dynamic instability) 03/13/2014  . Family history of colon cancer 12/02/2013  . Dyshidrotic eczema 11/17/2013  . Diastasis recti 04/08/2013  . Facial nerve paresis 07/31/2012  . Blepharospasm 05/08/2012  . 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 Obesity 03/28/2012  . Hemoptysis 12/22/2011  . Neuropathic postherpetic trigeminal neuralgia 12/12/2011  . Allergic rhinitis, mild 10/12/2011  . Disequilibrium syndrome 09/27/2011  . Chronic bronchitis ([place]) 06/05/2011  . Essential hypertension 05/24/2011  . Type 2 diabetes mellitus without complications ([place]) 82/02/155  . Pulmonary nodules 05/05/2011   Andrey Spearman, MS, OTR/L, [facility] 06/02/19 4:25 PM   [place] MAIN [facility] SERVICES [address] [place], [place], [address] Phone: [phone]   Fax:  [phone]  Name: Susan Callahan MRN: [id number] Date of Birth: Apr 17, 1951

## 2019-06-02 NOTE — Therapy (Signed)
[place] MAIN [facility] SERVICES [address] [place], [place], [address] Phone: [phone]   Fax:  [phone]  Physical Therapy Treatment/RECERT Patient Details  Name: Susan Callahan MRN: [id number] Date of Birth: 07-28-1951 Referring Provider (PT): Francesco Sor   Encounter Date: 06/02/2019    Past Medical History:  Diagnosis Date  . COPD (chronic obstructive pulmonary disease) ([place])   . Depression 1974  . Diabetes mellitus without complication ([place])   . Hypertension   . Migraine   . 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 LUMBAR PERITONEAL SHUNT    . TYMPANOSTOMY TUBE PLACEMENT      There were no vitals filed for this visit.     Patient had an incident last night/early this morning with left leg cramping.  Radiating symptoms did not release. Took over a half hour to get it to reduce. On Saturday also had radiating spasms.   RECERT Goals VAS: 0/09, however had had radiating pain in L leg .  Ambulate on treadmill 10 minutes : 7 minutes of intervals on incline 1-2%  MODI: 30% BLE strength:  10 minutes of upright postural positioning: able to maintain 10 minutes now.         manual  Roller to lumbar and thoracic paraspinals x 3 minutes.  Calf rollout with "the stick" 2 minutes each calf and hamstring  j swipe mobilization for postural alignment 10 seconds x 3 each level.  Hamstring stretch 60 seconds x 2 trials each LE Piriformis stretch with overpressure x2 trials 60 second holds x 2 trials.    Ther-ex: cueing for sequencing, positioning and muscle activation   Seated Posterior pelvic tilts x15. tactile and verbal cue for appropriate technique 5 seconds with hold, improved alignment Standing calf stretch on treadmill 30 seconds x 2 trials each  Seated TrA activation with upright posture marching 10x each LE Nerve glides with leg on PT    Pt educated  throughout session about proper posture and technique with exercises. Improved exercise technique, movement at target joints, use of target muscles after min to mod verbal, visual, tactile cues                       PT Short Term Goals - 05/06/19 1738      PT SHORT TERM GOAL #1   Title  Patient will be independent with completion of HEP to improve ability to complete functional tasks and functional ADLs.    Baseline  10/23: will give HEP next session 11/19: HEP compliance    Time  2    Period  Weeks    Status  Achieved      PT SHORT TERM GOAL #2   Title  Patient will report pain score less than 4/10 on VAS to demonstrate reduction of pain levels with functional tasks.     Baseline  10/23: 6/10 11/19: 5/10 12/12: 9/10 1/10: 7/10  2/11: 3/10     Time  2    Period  Weeks    Status  Achieved        PT Long Term Goals - 05/06/19 1738      PT LONG TERM GOAL #1   Title  Patient will report pain score of <3/10 on VAS for decreased pain levels and ease with functional activities.    Baseline  10/23: 6/10 11/19: 5/10 12/12 9/10 1/10: 7/10 2/11: 3/10 3/9: 6/10  5/26: 8/10 03/17/19: 7/10 8/3: 5/10 8/25: 4/10    Time  4    Period  Weeks    Status  Partially Met    Target Date  06/03/19      PT LONG TERM GOAL #2   Title  Patient will be able to ambulate on treadmill for 36mnutes without increase in back/leg pain demonstrating improved community ambulation and treadmill walking at pulmonary rehab    Baseline  10/23: 020mutes increases pain 11/19: 85m36mtes 12/12: has not had the chance to perform 1/10: was able to do 5 minutes without pain 2/11: able to do 5 minutes then had to bend over 3/9: 5 minutes 30 seconds at 2.0 mphs 5/26: had to walk 10 minutes at cancer center with extreme pain but was able to perform 7/6: walks 10 minutes with increase of pain by 2 points 8/3: able to perform with bent over posture    Time  4    Period  Weeks    Status  Achieved      PT LONG TERM  GOAL #3   Title  Patient will have decreased modified oswestry score <50% for improved ability to complete household tasks with less pain levels.     Baseline  10/23: 62% 11/19: 42%      Time  4    Period  Weeks    Status  Achieved      PT LONG TERM GOAL #4   Title  Patient will score 4+/5 on BLE MMT to demonstrate improved glute strength and ability to carry groceries with increased ease up/down stairs at home.    Baseline  10/23: 4-/5 grossly 11/19: 4/5 grossly; hamstrings 4-/5 12/12: 4/5 gross 1/10: 4/5 gross 2/11: gross 4+/5 with hamstrings and abuductors 4-/5 3/9: gross 4+/5 hamstrings and abductors 4-/5 5/27: grossly 3+/5 with pain with LLE movements 7/6: grossly 4-/5 with pain with LLE movement 8/3: deferred due to swelling/pain 8/25: grossly 4/5 without pain    Time  4    Period  Weeks    Status  Partially Met    Target Date  06/03/19      PT LONG TERM GOAL #5   Title  Patient will have decreased modified oswestry score <35% for improved ability to complete household tasks with less pain levels.     Baseline  11/19: 42% 12/12: 52% 1/10: 48%  2/11: 30%     Time  4    Period  Weeks    Status  Achieved      PT LONG TERM GOAL #6   Title  Patient will be able to make and/or strip the bed without pain and without needing rest breaks to perform iADLs independently.     Baseline  2/11: pain limits ability to perform, ~4 breaks required 3/9: takes her time, 3 breaks 5/27: very painful still 7/6: requires only one rest break 8/3: able to perform with +2 pain increase 8/25: no rest breaks required, increased pain 4 points    Time  4    Period  Weeks    Status  Partially Met    Target Date  06/03/19      PT LONG TERM GOAL #7   Title  Patient will have decreased modified oswestry score <20% for improved ability to complete household tasks with less pain levels.     Baseline  2/11: 30%  3/9: 30% 5/27: 63% 7/6: 38% 8/3: 42% 8/25: 36%    Time  4    Period  Weeks    Status  Partially Met     Target Date  06/03/19      PT LONG TERM GOAL #8   Title  Patient will tolerate >10 minutes of upright postural positioning of trunk with active mobility with <2 degrees VAS increase.    Baseline  8/3: unable to tolerate 10  minutes 8/25: tolerates 5 minutes    Time  4    Period  Weeks    Status  Partially Met    Target Date  06/03/19              Patient will benefit from skilled therapeutic intervention in order to improve the following deficits and impairments:     Visit Diagnosis: No diagnosis found.     Problem List Patient Active Problem List   Diagnosis Date Noted  . Edema 04/16/2019  . Postoperative wound infection 03/13/2019  . Seroma of breast 03/11/2019  . Age-related osteoporosis without current pathological fracture 02/27/2019  . Malignant neoplasm of central portion of right breast in female, estrogen receptor positive ([place]) 02/12/2019  . Genetic testing 01/31/2019  . Malignant neoplasm of upper-inner quadrant of right breast in female, estrogen receptor positive ([place]) 01/15/2019  . Long term current use of opiate analgesic 12/02/2018  . Tympanic membrane central perforation, left 11/04/2018  . DDD (degenerative disc disease), lumbosacral 10/10/2018  . Abnormal MRI, lumbar spine (08/29/2018) 10/10/2018  . 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 Lumbar nerve root impingement (Bilateral: L4 & L5) (L>R: L5) 10/10/2018  . Lumbar Spinal instability of L4-5 10/10/2018  . Lumbar facet hypertrophy 10/10/2018  . COPD (chronic obstructive pulmonary disease) with emphysema ([place]) 08/28/2018  . Elevated rheumatoid factor 08/13/2018  . Elevated C-reactive protein (CRP) 08/12/2018  . Elevated sed rate 08/12/2018  . Lumbar facet syndrome (Bilateral) 08/12/2018  . Back pain with left-sided radiculopathy 08/12/2018  . DDD (degenerative disc disease), lumbar 08/12/2018  . Chronic lower extremity pain (Primary Area of Pain)  (Bilateral) (L>R) 07/23/2018  . Chronic low back pain (Secondary Area of Pain) (Bilateral) (L>R) w/ sciatica (Bilateral) 07/23/2018  . Chronic pain syndrome 07/23/2018  . Pharmacologic therapy 07/23/2018  . Disorder of skeletal system 07/23/2018  . Problems influencing health status 07/23/2018  . Chronic sacroiliac joint pain [facility] Area of Pain) (Bilateral) (L>R) 07/23/2018  . Status post lumboperitoneal shunt placement 02/26/2018  . 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 Tympanic membrane perforation 05/30/2016  . PMB (postmenopausal bleeding) 04/26/2016  . Rash 12/27/2015  . 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 Plantar fasciitis of foot (Left) 05/26/2015  . Spondylosis of lumbar region without myelopathy or radiculopathy 05/26/2015  . Lumbar foraminal stenosis (L3-4, L4-5, L5-S1) (L>R) 05/26/2015  . Spinal stenosis of lumbar region with neurogenic claudication 05/26/2015  . Encounter for screening mammogram for malignant neoplasm of breast 12/02/2014  . Goiter 12/02/2014  . Chronic idiopathic gout of foot (Right) 07/06/2014  . Biceps tendonitis (Right) 05/19/2014  . Shoulder pain (Right) 05/19/2014  . Facial weakness 05/05/2014  . Post herpetic neuralgia 05/05/2014  . Chronic dysfunction of left eustachian tube 03/24/2014  . Osteopenia 03/13/2014  . Lumbar Grade 1 Anterolisthesis of L3/4 (58m) & L4/5 (4-126m(w/ Dynamic instability) 03/13/2014  . Family history of colon cancer 12/02/2013  . Dyshidrotic eczema 11/17/2013  . Diastasis recti 04/08/2013  . Facial nerve paresis 07/31/2012  . Blepharospasm 05/08/2012  . Conductive hearing loss of left  ear with unrestricted hearing of right ear 04/08/2012  . Obesity 03/28/2012  . Hemoptysis 12/22/2011  . Neuropathic postherpetic trigeminal neuralgia 12/12/2011  . Allergic rhinitis, mild 10/12/2011  . Disequilibrium syndrome 09/27/2011  . Chronic bronchitis  ([place]) 06/05/2011  . Essential hypertension 05/24/2011  . Type 2 diabetes mellitus without complications ([place]) 48/61/6122  . Pulmonary nodules 05/05/2011   Janna Arch, PT, DPT   06/02/2019, 3:17 PM  [place] MAIN [facility] SERVICES [address] [place], [place], [address] Phone: [phone]   Fax:  [phone]  Name: Mamye Bolds MRN: [id number] Date of Birth: 1951-03-03

## 2019-06-04 ENCOUNTER — Ambulatory Visit: Payer: Medicare Other

## 2019-06-04 ENCOUNTER — Other Ambulatory Visit: Payer: Self-pay

## 2019-06-04 ENCOUNTER — Ambulatory Visit: Payer: Medicare Other | Admitting: Occupational Therapy

## 2019-06-04 DIAGNOSIS — I972 Postmastectomy lymphedema syndrome: Secondary | ICD-10-CM

## 2019-06-04 NOTE — Therapy (Signed)
[place] MAIN [facility] SERVICES [address] [place], [place], [address] Phone: [phone]   Fax:  [phone]  Occupational Therapy Treatment  Patient Details  Name: Susan Callahan MRN: [id number] Date of Birth: 11/24/1950 Referring Provider (OT): Humberto Leep, MD   Encounter Date: 06/04/2019  OT End of Session - 06/04/19 1640    Visit Number  31    Number of Visits  36    Date for OT Re-Evaluation  08/20/19    OT Start Time  0205    OT Stop Time  0305    OT Time Calculation (min)  60 min    Activity Tolerance  Patient tolerated treatment well;No increased pain    Behavior During Therapy  WFL for tasks assessed/performed       Past Medical History:  Diagnosis Date  . COPD (chronic obstructive pulmonary disease) ([place])   . Depression 1974  . Diabetes mellitus without complication ([place])   . Hypertension   . Migraine   . 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 LUMBAR PERITONEAL SHUNT    . TYMPANOSTOMY TUBE PLACEMENT      There were no vitals filed for this visit.  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 Pt complaining of irritating axillary cord ~ 2 cm proximal to inital axillary web .    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 01/30/19 partial mastectomy. Margins clear. 0/2 LN  negative for  disease. actinic keritosis,DDD, DM, obesity, thyroid nodules, pulmonary nodules, chronic back pain, HTN, COPD, hx depression    Limitations  pain and swelling in R axilla and arm, chronic back pain, muscle weakness, impaired balance, decreased RUE AROM, abnormal gait, difficulty walking, impaired functional mobility and transfers,    Repetition  Increases Symptoms    Special Tests  -Stemmer at MPs bilaterally    Patient Stated  Goals  relduce pain and improve movement  in my R arm so I can do things I need to do. reduce risk of lymphedema    Pain Onset  More than a month ago    Pain Onset  More than a month ago                   OT Treatments/Exercises (OP) - 06/04/19 0001      ADLs   ADL Education Given  Yes      Manual Therapy   Manual Therapy  Edema management    Myofascial Release  myofacial release techniques at R axilla for reoccurrence of axillary cording    Manual Lymphatic Drainage (MLD)  MLD to RUE/RUQ as established using cntalateral AAA    Compression Bandaging  RUE and hand wrapped using multilayer short stretch wraps over Rosidal foam and OTS cmpression glove. Kinesiotape ( 2" wide fan cuts on end  attached over metacarpals and anchored on wrist extensor muscle bellies to facilitate passive skin stretch to decongest stubborn hand swelling             OT Education - 06/04/19 1639    Education Details  Continued skilled Pt/caregiver education  And LE ADL training throughout visit for lymphedema self care/ home program, including compression wrapping, compression garment and device wear/care, lymphatic pumping ther ex, simple self-MLD, and skin care. Discussed  progress towards goals.    Person(s) Educated  Patient    Methods  Demonstration;Explanation;Tactile cues;Verbal cues;Handout    Comprehension  Verbalized understanding;Returned demonstration          OT Long Term Goals - 06/02/19 1609      OT LONG TERM GOAL #1   Title  Pt independent with modified lymphedema precautions and prevention strategies to limit LE progression.    Baseline  Max A    Time  4    Period  Days    Status  Achieved      OT LONG TERM GOAL #2   Title  Pt will be independent with lymphedema self-care home program and AWS ther ex to improve functional performance of basic and instrumental ADLs by DC.    Baseline  Max A    Time  12    Period  Weeks    Status  Achieved      OT LONG TERM GOAL #3    Title  Pt wil report 0/10 pain with RUE functional use ( reaching, lifting, carrying, dressing, dressing, etc) to achieve independent , pain free ADLs performance.    Baseline  Max A 05/05/19: Reporting 4/10 lymphadema pain now combined with AWS related pain. 05/22/19 AWS pain resolved. Pt now experiencing pain in hand and distal FA associated with recent onset of lymphedema.    Time  12    Period  Weeks    Status  Achieved      OT LONG TERM GOAL #4   Title  Pt will be able to apply gradient compression wraps using correct techniques independently to control UE swelling and limit LE progression.    Baseline  Max A    Time  1    Period  Weeks    Status  Achieved      OT LONG TERM GOAL #5   Title  After skilled trainng Pt will be able to don and doff  appropriate compression garments and/ or devices using correct techniques and assistive devices with extra time  by end of Intensive Phase CDT for optimal LE management to limit progression over time.    Baseline  Max A    Time  12    Period  Weeks    Status  New    Target Date  --   issue date     Long Term Additional Goals   Additional Long Term Goals  Yes      OT LONG TERM GOAL #6   Title  Due to onset of RUE swelling concentrated primarily in R hand during manual therapy for cording  Pt will achieve 5%, or greater RUE  limb volume reductions during Intensive Phase CDT to  improve ADLs performances, to reduce infection risk, and to limit LE progression.    Baseline  Max A    Time  12    Period  Weeks    Status  New    Target Date  08/31/19            Plan - 06/04/19 1641    Clinical Impression Statement  Additional axillary cording palpable and visible ~ 2 cm proximal to original web near seroma. Performed myofacial release techniques  for most of session to stretch sclerotic lymphatic cords. Unable to feel any cords release during session, but Pt reorted some relief from tightness under arm after session. Completed session  with ~ 15 minutes MLD to R arm and hand in efforrt to calm any  inflammation stirred up with deaper, more aggressive massage. Pt toerated manual treatments and compression wraps applied at end. Cont as per OC.    OT Occupational Profile and History  Comprehensive Assessment- Review of records and extensive additional review of physical, cognitive, psychosocial history related to current functional performance    Occupational performance deficits (Please refer to evaluation for details):  ADL's;IADL's;Social Participation;Leisure;Rest and Sleep;Work    Marketing executive / Function / Physical Skills  ADL;Decreased knowledge of precautions;ROM;UE functional use;Decreased knowledge of use of DME;Edema;Skin integrity;Pain;IADL    Rehab Potential  Good    Clinical Decision Making  Several treatment options, min-mod task modification necessary    Comorbidities Affecting Occupational Performance:  Presence of comorbidities impacting occupational performance    Comorbidities impacting occupational performance description:  see SUBJUECTIVE    Modification or Assistance to Complete Evaluation   Min-Moderate modification of tasks or assist with assess necessary to complete eval    OT Frequency  2x / week    OT Duration  12 weeks    OT Treatment/Interventions  Self-care/ADL training;Therapeutic exercise;Manual lymph drainage;Patient/family education;Therapeutic activities;DME and/or AE instruction;Manual Therapy;Other (comment)   consider fitting with prophylactic cmopression arm sleeve   Plan  BUE AROM ther ex to reduce axillary cording- 2 sets of 10 2-3 x daily    Consulted and Agree with Plan of Care  Patient       Patient will benefit from skilled therapeutic intervention in order to improve the following deficits and impairments:   Body Structure / Function / Physical Skills: ADL, Decreased knowledge of precautions, ROM, UE functional use, Decreased knowledge of use of DME, Edema, Skin integrity, Pain, IADL        Visit Diagnosis: Post-mastectomy lymphedema syndrome    Problem List Patient Active Problem List   Diagnosis Date Noted  . Edema 04/16/2019  . Postoperative wound infection 03/13/2019  . Seroma of breast 03/11/2019  . Age-related osteoporosis without current pathological fracture 02/27/2019  . Malignant neoplasm of central portion of right breast in female, estrogen receptor positive ([place]) 02/12/2019  . Genetic testing 01/31/2019  . Malignant neoplasm of upper-inner quadrant of right breast in female, estrogen receptor positive ([place]) 01/15/2019  . Long term current use of opiate analgesic 12/02/2018  . Tympanic membrane central perforation, left 11/04/2018  . DDD (degenerative disc disease), lumbosacral 10/10/2018  . Abnormal MRI, lumbar spine (08/29/2018) 10/10/2018  . 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 Lumbar nerve root impingement (Bilateral: L4 & L5) (L>R: L5) 10/10/2018  . Lumbar Spinal instability of L4-5 10/10/2018  . Lumbar facet hypertrophy 10/10/2018  . COPD (chronic obstructive pulmonary disease) with emphysema ([place]) 08/28/2018  . Elevated rheumatoid factor 08/13/2018  . Elevated C-reactive protein (CRP) 08/12/2018  . Elevated sed rate 08/12/2018  . Lumbar facet syndrome (Bilateral) 08/12/2018  . Back pain with left-sided radiculopathy 08/12/2018  . DDD (degenerative disc disease), lumbar 08/12/2018  . Chronic lower extremity pain (Primary Area of Pain) (Bilateral) (L>R) 07/23/2018  . Chronic low back pain (Secondary Area of Pain) (Bilateral) (L>R) w/ sciatica (Bilateral) 07/23/2018  . Chronic pain syndrome 07/23/2018  . Pharmacologic therapy 07/23/2018  . Disorder of skeletal system 07/23/2018  . Problems influencing health status 07/23/2018  . Chronic sacroiliac joint pain [facility] Area of Pain) (Bilateral) (L>R) 07/23/2018  . Status post lumboperitoneal shunt placement 02/26/2018  . 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 Tympanic membrane perforation 05/30/2016  . PMB (postmenopausal bleeding) 04/26/2016  .  Rash 12/27/2015  . 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 Plantar fasciitis of foot (Left) 05/26/2015  . Spondylosis of lumbar region without myelopathy or radiculopathy 05/26/2015  . Lumbar foraminal stenosis (L3-4, L4-5, L5-S1) (L>R) 05/26/2015  . Spinal stenosis of lumbar region with neurogenic claudication 05/26/2015  . Encounter for screening mammogram for malignant neoplasm of breast 12/02/2014  . Goiter 12/02/2014  . Chronic idiopathic gout of foot (Right) 07/06/2014  . Biceps tendonitis (Right) 05/19/2014  . Shoulder pain (Right) 05/19/2014  . Facial weakness 05/05/2014  . Post herpetic neuralgia 05/05/2014  . Chronic dysfunction of left eustachian tube 03/24/2014  . Osteopenia 03/13/2014  . Lumbar Grade 1 Anterolisthesis of L3/4 (91mm) & L4/5 (4-70mm)(w/ Dynamic instability) 03/13/2014  . Family history of colon cancer 12/02/2013  . Dyshidrotic eczema 11/17/2013  . Diastasis recti 04/08/2013  . Facial nerve paresis 07/31/2012  . Blepharospasm 05/08/2012  . 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 Obesity 03/28/2012  . Hemoptysis 12/22/2011  . Neuropathic postherpetic trigeminal neuralgia 12/12/2011  . Allergic rhinitis, mild 10/12/2011  . Disequilibrium syndrome 09/27/2011  . Chronic bronchitis ([place]) 06/05/2011  . Essential hypertension 05/24/2011  . Type 2 diabetes mellitus without complications ([place]) 39/76/7341  . Pulmonary nodules 05/05/2011    Andrey Spearman, MS, OTR/L, [facility] 06/04/19 4:45 PM  [place] MAIN [facility] SERVICES [address] [place], [place], [address] Phone: [phone]   Fax:  [phone]  Name: Susan Callahan MRN: [id number] Date of Birth: 1950/12/20

## 2019-06-05 ENCOUNTER — Other Ambulatory Visit: Payer: Self-pay

## 2019-06-05 ENCOUNTER — Ambulatory Visit: Payer: Medicare Other

## 2019-06-05 ENCOUNTER — Ambulatory Visit: Payer: Medicare Other | Admitting: Occupational Therapy

## 2019-06-05 DIAGNOSIS — G8929 Other chronic pain: Secondary | ICD-10-CM

## 2019-06-05 DIAGNOSIS — I972 Postmastectomy lymphedema syndrome: Secondary | ICD-10-CM | POA: Diagnosis not present

## 2019-06-05 DIAGNOSIS — M6281 Muscle weakness (generalized): Secondary | ICD-10-CM

## 2019-06-05 NOTE — Therapy (Signed)
[place] MAIN [facility] SERVICES [address] [place], [place], [address] Phone: [phone]   Fax:  [phone]  Occupational Therapy Treatment  Patient Details  Name: Susan Callahan MRN: [id number] Date of Birth: 1951-06-15 Referring Provider (OT): Humberto Leep, MD   Encounter Date: 06/05/2019  OT End of Session - 06/05/19 1641    Visit Number  32    Number of Visits  36    Date for OT Re-Evaluation  08/20/19    Activity Tolerance  Patient tolerated treatment well;No increased pain    Behavior During Therapy  WFL for tasks assessed/performed       Past Medical History:  Diagnosis Date  . COPD (chronic obstructive pulmonary disease) ([place])   . Depression 1974  . Diabetes mellitus without complication ([place])   . Hypertension   . Migraine   . 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 LUMBAR PERITONEAL SHUNT    . TYMPANOSTOMY TUBE PLACEMENT      There were no vitals filed for this visit.  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 Pt complaining of irritating axillary cord ~ 2 cm proximal to inital axillary web .    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 01/30/19 partial mastectomy. Margins clear. 0/2 LN  negative for  disease. actinic keritosis,DDD, DM, obesity, thyroid nodules, pulmonary nodules, chronic back pain, HTN, COPD, hx depression    Limitations  pain and swelling in R axilla and arm, chronic back pain, muscle weakness, impaired balance, decreased RUE AROM, abnormal gait, difficulty walking, impaired functional mobility and transfers,    Repetition  Increases Symptoms    Special Tests  -Stemmer at MPs bilaterally    Patient Stated Goals  relduce pain and improve movement  in my R arm so I can do things I need to  do. reduce risk of lymphedema    Pain Onset  More than a month ago    Pain Onset  More than a month ago                                OT Long Term Goals - 06/02/19 1609      OT LONG TERM GOAL #1   Title  Pt independent with modified lymphedema precautions and prevention strategies to limit LE progression.    Baseline  Max A    Time  4    Period  Days    Status  Achieved      OT LONG TERM GOAL #2   Title  Pt will be independent with lymphedema self-care home program and AWS ther ex to improve functional performance of basic and instrumental ADLs by DC.    Baseline  Max A    Time  12    Period  Weeks    Status  Achieved      OT LONG TERM GOAL #3   Title  Pt wil report 0/10 pain with RUE functional use ( reaching, lifting, carrying, dressing, dressing, etc) to achieve independent , pain free ADLs performance.    Baseline  Max A 05/05/19: Reporting 4/10 lymphadema pain now combined with AWS related pain. 05/22/19 AWS pain resolved. Pt now experiencing pain in hand  and distal FA associated with recent onset of lymphedema.    Time  12    Period  Weeks    Status  Achieved      OT LONG TERM GOAL #4   Title  Pt will be able to apply gradient compression wraps using correct techniques independently to control UE swelling and limit LE progression.    Baseline  Max A    Time  1    Period  Weeks    Status  Achieved      OT LONG TERM GOAL #5   Title  After skilled trainng Pt will be able to don and doff  appropriate compression garments and/ or devices using correct techniques and assistive devices with extra time  by end of Intensive Phase CDT for optimal LE management to limit progression over time.    Baseline  Max A    Time  12    Period  Weeks    Status  New    Target Date  --   issue date     Long Term Additional Goals   Additional Long Term Goals  Yes      OT LONG TERM GOAL #6   Title  Due to onset of RUE swelling concentrated primarily in R hand  during manual therapy for cording  Pt will achieve 5%, or greater RUE  limb volume reductions during Intensive Phase CDT to  improve ADLs performances, to reduce infection risk, and to limit LE progression.    Baseline  Max A    Time  12    Period  Weeks    Status  New    Target Date  08/31/19            Plan - 06/05/19 1641    Clinical Impression Statement  Pt tolerated myofacial release techniques to axillary cording extending to distal arm without difficulty today. Swelling in hand continues to improve with MLD, compression, kinesiotape and wrapping. Cont as per POC. Fit custom arm garments ASAP.    OT Occupational Profile and History  Comprehensive Assessment- Review of records and extensive additional review of physical, cognitive, psychosocial history related to current functional performance    Occupational performance deficits (Please refer to evaluation for details):  ADL's;IADL's;Social Participation;Leisure;Rest and Sleep;Work    Marketing executive / Function / Physical Skills  ADL;Decreased knowledge of precautions;ROM;UE functional use;Decreased knowledge of use of DME;Edema;Skin integrity;Pain;IADL    Rehab Potential  Good    Clinical Decision Making  Several treatment options, min-mod task modification necessary    Comorbidities Affecting Occupational Performance:  Presence of comorbidities impacting occupational performance    Comorbidities impacting occupational performance description:  see SUBJUECTIVE    Modification or Assistance to Complete Evaluation   Min-Moderate modification of tasks or assist with assess necessary to complete eval    OT Frequency  2x / week    OT Duration  12 weeks    OT Treatment/Interventions  Self-care/ADL training;Therapeutic exercise;Manual lymph drainage;Patient/family education;Therapeutic activities;DME and/or AE instruction;Manual Therapy;Other (comment)   consider fitting with prophylactic cmopression arm sleeve   Plan  BUE AROM ther ex to  reduce axillary cording- 2 sets of 10 2-3 x daily    Consulted and Agree with Plan of Care  Patient       Patient will benefit from skilled therapeutic intervention in order to improve the following deficits and impairments:   Body Structure / Function / Physical Skills: ADL, Decreased knowledge of precautions, ROM, UE functional use,  Decreased knowledge of use of DME, Edema, Skin integrity, Pain, IADL       Visit Diagnosis: Postmastectomy lymphedema syndrome    Problem List Patient Active Problem List   Diagnosis Date Noted  . Edema 04/16/2019  . Postoperative wound infection 03/13/2019  . Seroma of breast 03/11/2019  . Age-related osteoporosis without current pathological fracture 02/27/2019  . Malignant neoplasm of central portion of right breast in female, estrogen receptor positive ([place]) 02/12/2019  . Genetic testing 01/31/2019  . Malignant neoplasm of upper-inner quadrant of right breast in female, estrogen receptor positive ([place]) 01/15/2019  . Long term current use of opiate analgesic 12/02/2018  . Tympanic membrane central perforation, left 11/04/2018  . DDD (degenerative disc disease), lumbosacral 10/10/2018  . Abnormal MRI, lumbar spine (08/29/2018) 10/10/2018  . 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 Lumbar nerve root impingement (Bilateral: L4 & L5) (L>R: L5) 10/10/2018  . Lumbar Spinal instability of L4-5 10/10/2018  . Lumbar facet hypertrophy 10/10/2018  . COPD (chronic obstructive pulmonary disease) with emphysema ([place]) 08/28/2018  . Elevated rheumatoid factor 08/13/2018  . Elevated C-reactive protein (CRP) 08/12/2018  . Elevated sed rate 08/12/2018  . Lumbar facet syndrome (Bilateral) 08/12/2018  . Back pain with left-sided radiculopathy 08/12/2018  . DDD (degenerative disc disease), lumbar 08/12/2018  . Chronic lower extremity pain (Primary Area of Pain) (Bilateral) (L>R) 07/23/2018  . Chronic low back pain  (Secondary Area of Pain) (Bilateral) (L>R) w/ sciatica (Bilateral) 07/23/2018  . Chronic pain syndrome 07/23/2018  . Pharmacologic therapy 07/23/2018  . Disorder of skeletal system 07/23/2018  . Problems influencing health status 07/23/2018  . Chronic sacroiliac joint pain [facility] Area of Pain) (Bilateral) (L>R) 07/23/2018  . Status post lumboperitoneal shunt placement 02/26/2018  . 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 Tympanic membrane perforation 05/30/2016  . PMB (postmenopausal bleeding) 04/26/2016  . Rash 12/27/2015  . 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 Plantar fasciitis of foot (Left) 05/26/2015  . Spondylosis of lumbar region without myelopathy or radiculopathy 05/26/2015  . Lumbar foraminal stenosis (L3-4, L4-5, L5-S1) (L>R) 05/26/2015  . Spinal stenosis of lumbar region with neurogenic claudication 05/26/2015  . Encounter for screening mammogram for malignant neoplasm of breast 12/02/2014  . Goiter 12/02/2014  . Chronic idiopathic gout of foot (Right) 07/06/2014  . Biceps tendonitis (Right) 05/19/2014  . Shoulder pain (Right) 05/19/2014  . Facial weakness 05/05/2014  . Post herpetic neuralgia 05/05/2014  . Chronic dysfunction of left eustachian tube 03/24/2014  . Osteopenia 03/13/2014  . Lumbar Grade 1 Anterolisthesis of L3/4 (53mm) & L4/5 (4-2mm)(w/ Dynamic instability) 03/13/2014  . Family history of colon cancer 12/02/2013  . Dyshidrotic eczema 11/17/2013  . Diastasis recti 04/08/2013  . Facial nerve paresis 07/31/2012  . Blepharospasm 05/08/2012  . 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 Obesity 03/28/2012  . Hemoptysis 12/22/2011  . Neuropathic postherpetic trigeminal neuralgia 12/12/2011  . Allergic rhinitis, mild 10/12/2011  . Disequilibrium syndrome 09/27/2011  . Chronic bronchitis ([place]) 06/05/2011  . Essential hypertension 05/24/2011   . Type 2 diabetes mellitus without complications ([place]) 92/07/9416  . Pulmonary nodules 05/05/2011    Andrey Spearman, MS, OTR/L, [facility] 06/05/19 4:44 PM  [place] MAIN [facility] SERVICES [address] [place], [place], [address] Phone: [phone]   Fax:  [phone]  Name: Susan Callahan MRN: [id number] Date of Birth: 08-Dec-1950

## 2019-06-05 NOTE — Therapy (Signed)
[place] MAIN [facility] SERVICES [address] [place], [place], [address] Phone: [phone]   Fax:  [phone]  Physical Therapy Treatment  Patient Details  Name: Susan Callahan MRN: [id number] Date of Birth: 1951-04-11 Referring Provider (PT): Francesco Sor   Encounter Date: 06/05/2019  PT End of Session - 06/05/19 1507    Visit Number  80    Number of Visits  77    Date for PT Re-Evaluation  06/30/19    Authorization Type  7/10 PN on 05/06/19    PT Start Time  0205    PT Stop Time  0255    PT Time Calculation (min)  50 min    Equipment Utilized During Treatment  Gait belt    Activity Tolerance  Patient tolerated treatment well    Behavior During Therapy  [facility] for tasks assessed/performed       Past Medical History:  Diagnosis Date  . COPD (chronic obstructive pulmonary disease) ([place])   . Depression 1974  . Diabetes mellitus without complication ([place])   . Hypertension   . Migraine   . 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 LUMBAR PERITONEAL SHUNT    . TYMPANOSTOMY TUBE PLACEMENT      There were no vitals filed for this visit.  Subjective Assessment - 06/05/19 1505    Subjective  Pt reports no new problems since her last visit.  She rates her LBP as 2/10 upon arrival today.    Pertinent History  Pain began in 2007 and has gradually got worse and has progressed to nonstop. Patient states pain last all day long, everyday with no change in symptoms. Medication helps briefly. States she has difficulty with showering and standing. States she has to stand with her knee bent due to pulling sensation in legs. Pain begins immediately when stands and begins to walk. Pain feels better when she bends her knees when standing. Pain became constant and non stop around Dec 2018. Denies bowel or bladder changes, unexplained weakness, or unexpected weight loss/gain. She currently goes to  pulmonary rehab 3x a week, but has increased pain with treadmill walking. Will walk with cane on occasion around house especially at night due to occasional dizziness and vision impairments in medical history. States she also has difficulty with carrying groceries up steps due to low back pain. She would like to be able to walk better and have less pain to be less reliant on medication.     Limitations  Lifting;Standing;Walking;House hold activities    How long can you stand comfortably?  pain begins immediately     How long can you walk comfortably?  pain begins immediately     Patient Stated Goals  walk better, have less pain    Currently in Pain?  Yes    Pain Score  2     Pain Location  Back    Pain Orientation  Lower    Pain Descriptors / Indicators  Aching    Pain Type  Chronic pain    Pain Onset  More than a month ago    Pain Frequency  Intermittent    Multiple Pain Sites  No           Treatment:  Manual Therapy:  petrissage to B thoracolumbar paraspinals with "roller stick" in prone; STM "roll outs" with roller stick in prone to B HS and calves.  Therapeutic Exercise:  Seated pelvic  tilts with TA squeeze 20x; seated alt marching with TA squeeze on plinth 20x; forward flexion stretches with hands on swiss ball 10x; Bilateral sidebending seated thoracic stretches with hands on swiss ball 10x each direction; seated pelvic tilts on swiss ball 20x; alt marching while seated on swiss ball with TA bracing 2x10 B.  Interval ambulation on TM at 2.0 mph, alternating between 1.0 and 2.0 incline with focus on ab bracing and postural control.                    PT Education - 06/05/19 1507    Education Details  Exercise technique; body mechanics    Person(s) Educated  Patient    Methods  Explanation;Demonstration;Tactile cues;Verbal cues    Comprehension  Verbalized understanding;Returned demonstration;Verbal cues required;Tactile cues required       PT Short Term Goals  - 05/06/19 1738      PT SHORT TERM GOAL #1   Title  Patient will be independent with completion of HEP to improve ability to complete functional tasks and functional ADLs.    Baseline  10/23: will give HEP next session 11/19: HEP compliance    Time  2    Period  Weeks    Status  Achieved      PT SHORT TERM GOAL #2   Title  Patient will report pain score less than 4/10 on VAS to demonstrate reduction of pain levels with functional tasks.     Baseline  10/23: 6/10 11/19: 5/10 12/12: 9/10 1/10: 7/10  2/11: 3/10     Time  2    Period  Weeks    Status  Achieved        PT Long Term Goals - 06/02/19 1658      PT LONG TERM GOAL #1   Title  Patient will report pain score of <3/10 on VAS for decreased pain levels and ease with functional activities.    Baseline  10/23: 6/10 11/19: 5/10 12/12 9/10 1/10: 7/10 2/11: 3/10 3/9: 6/10  5/26: 8/10 03/17/19: 7/10 8/3: 5/10 8/25: 4/10 9/21: 4/10    Time  4    Period  Weeks    Status  Partially Met    Target Date  06/30/19      PT LONG TERM GOAL #2   Title  Patient will be able to ambulate on treadmill for 21mnutes without increase in back/leg pain demonstrating improved community ambulation and treadmill walking at pulmonary rehab    Baseline  10/23: 049mutes increases pain 11/19: 74m35mtes 12/12: has not had the chance to perform 1/10: was able to do 5 minutes without pain 2/11: able to do 5 minutes then had to bend over 3/9: 5 minutes 30 seconds at 2.0 mphs 5/26: had to walk 10 minutes at cancer center with extreme pain but was able to perform 7/6: walks 10 minutes with increase of pain by 2 points 8/3: able to perform with bent over posture 9/21: able to perform 7 minutes with intervals of 1-2% incline    Time  4    Period  Weeks    Status  Achieved      PT LONG TERM GOAL #3   Title  Patient will have decreased modified oswestry score <50% for improved ability to complete household tasks with less pain levels.     Baseline  10/23: 62% 11/19: 42%       Time  4    Period  Weeks    Status  Achieved  PT LONG TERM GOAL #4   Title  Patient will score 4+/5 on BLE MMT to demonstrate improved glute strength and ability to carry groceries with increased ease up/down stairs at home.    Baseline  10/23: 4-/5 grossly 11/19: 4/5 grossly; hamstrings 4-/5 12/12: 4/5 gross 1/10: 4/5 gross 2/11: gross 4+/5 with hamstrings and abuductors 4-/5 3/9: gross 4+/5 hamstrings and abductors 4-/5 5/27: grossly 3+/5 with pain with LLE movements 7/6: grossly 4-/5 with pain with LLE movement 8/3: deferred due to swelling/pain 8/25: grossly 4/5 without pain 9/21: grossly 4/5 with hamstrings not tested due to spasm    Time  4    Period  Weeks    Status  Partially Met    Target Date  06/30/19      PT LONG TERM GOAL #5   Title  Patient will have decreased modified oswestry score <35% for improved ability to complete household tasks with less pain levels.     Baseline  11/19: 42% 12/12: 52% 1/10: 48%  2/11: 30%     Time  4    Period  Weeks    Status  Achieved      PT LONG TERM GOAL #6   Title  Patient will be able to make and/or strip the bed without pain and without needing rest breaks to perform iADLs independently.     Baseline  2/11: pain limits ability to perform, ~4 breaks required 3/9: takes her time, 3 breaks 5/27: very painful still 7/6: requires only one rest break 8/3: able to perform with +2 pain increase 8/25: no rest breaks required, increased pain 4 points 9/21: able to perform with +1-2 symptoms    Time  4    Period  Weeks    Status  Partially Met    Target Date  06/30/19      PT LONG TERM GOAL #7   Title  Patient will have decreased modified oswestry score <20% for improved ability to complete household tasks with less pain levels.     Baseline  2/11: 30%  3/9: 30% 5/27: 63% 7/6: 38% 8/3: 42% 8/25: 36% 9/21: 30%    Time  4    Period  Weeks    Status  Partially Met    Target Date  06/30/19      PT LONG TERM GOAL #8   Title  Patient will  tolerate >10 minutes of upright postural positioning of trunk with active mobility with <2 degrees VAS increase.    Baseline  8/3: unable to tolerate 10  minutes 8/25: tolerates 5 minutes 9/21: tolerates 10 minutes now    Time  4    Period  Weeks    Status  Achieved            Plan - 06/05/19 1508    Clinical Impression Statement  Patient reported less difficulty and pain with TM interval ambulation training today vs last visit.  She also demonsrated good balance with alt marching on [employer] today.  Continue to work on core strengthening and ambulation at next visit.    Stability/Clinical Decision Making  Evolving/Moderate complexity    Clinical Decision Making  Moderate    Rehab Potential  Fair    Clinical Impairments Affecting Rehab Potential  (+) motivation (-) chronicity of symptoms    PT Frequency  2x / week    PT Duration  4 weeks    PT Treatment/Interventions  ADLs/Self Care Home Management;Cryotherapy;Moist Heat;Traction;Iontophoresis 3m/ml Dexamethasone;Ultrasound;Gait training;Stair training;Functional mobility training;Neuromuscular  re-education;Balance training;Therapeutic exercise;Therapeutic activities;Patient/family education;Manual techniques;Energy conservation;Aquatic Therapy;Electrical Stimulation;Passive range of motion;Dry needling;Taping    PT Next Visit Plan  progress strengthening and postural mechanics    Consulted and Agree with Plan of Care  Patient       Patient will benefit from skilled therapeutic intervention in order to improve the following deficits and impairments:  Abnormal gait, Cardiopulmonary status limiting activity, Decreased activity tolerance, Decreased balance, Decreased endurance, Decreased range of motion, Difficulty walking, Decreased strength, Hypomobility, Decreased mobility, Impaired perceived functional ability, Impaired flexibility, Postural dysfunction, Obesity, Improper body mechanics, Pain  Visit Diagnosis: Post-mastectomy  lymphedema syndrome  Chronic low back pain, unspecified back pain laterality, unspecified whether sciatica present  Muscle weakness (generalized)     Problem List Patient Active Problem List   Diagnosis Date Noted  . Edema 04/16/2019  . Postoperative wound infection 03/13/2019  . Seroma of breast 03/11/2019  . Age-related osteoporosis without current pathological fracture 02/27/2019  . Malignant neoplasm of central portion of right breast in female, estrogen receptor positive ([place]) 02/12/2019  . Genetic testing 01/31/2019  . Malignant neoplasm of upper-inner quadrant of right breast in female, estrogen receptor positive ([place]) 01/15/2019  . Long term current use of opiate analgesic 12/02/2018  . Tympanic membrane central perforation, left 11/04/2018  . DDD (degenerative disc disease), lumbosacral 10/10/2018  . Abnormal MRI, lumbar spine (08/29/2018) 10/10/2018  . 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 Lumbar nerve root impingement (Bilateral: L4 & L5) (L>R: L5) 10/10/2018  . Lumbar Spinal instability of L4-5 10/10/2018  . Lumbar facet hypertrophy 10/10/2018  . COPD (chronic obstructive pulmonary disease) with emphysema ([place]) 08/28/2018  . Elevated rheumatoid factor 08/13/2018  . Elevated C-reactive protein (CRP) 08/12/2018  . Elevated sed rate 08/12/2018  . Lumbar facet syndrome (Bilateral) 08/12/2018  . Back pain with left-sided radiculopathy 08/12/2018  . DDD (degenerative disc disease), lumbar 08/12/2018  . Chronic lower extremity pain (Primary Area of Pain) (Bilateral) (L>R) 07/23/2018  . Chronic low back pain (Secondary Area of Pain) (Bilateral) (L>R) w/ sciatica (Bilateral) 07/23/2018  . Chronic pain syndrome 07/23/2018  . Pharmacologic therapy 07/23/2018  . Disorder of skeletal system 07/23/2018  . Problems influencing health status 07/23/2018  . Chronic sacroiliac joint pain [facility] Area of Pain) (Bilateral) (L>R)  07/23/2018  . Status post lumboperitoneal shunt placement 02/26/2018  . 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 Tympanic membrane perforation 05/30/2016  . PMB (postmenopausal bleeding) 04/26/2016  . Rash 12/27/2015  . 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 Plantar fasciitis of foot (Left) 05/26/2015  . Spondylosis of lumbar region without myelopathy or radiculopathy 05/26/2015  . Lumbar foraminal stenosis (L3-4, L4-5, L5-S1) (L>R) 05/26/2015  . Spinal stenosis of lumbar region with neurogenic claudication 05/26/2015  . Encounter for screening mammogram for malignant neoplasm of breast 12/02/2014  . Goiter 12/02/2014  . Chronic idiopathic gout of foot (Right) 07/06/2014  . Biceps tendonitis (Right) 05/19/2014  . Shoulder pain (Right) 05/19/2014  . Facial weakness 05/05/2014  . Post herpetic neuralgia 05/05/2014  . Chronic dysfunction of left eustachian tube 03/24/2014  . Osteopenia 03/13/2014  . Lumbar Grade 1 Anterolisthesis of L3/4 (96m) & L4/5 (4-195m(w/ Dynamic instability) 03/13/2014  . Family history of colon cancer 12/02/2013  . Dyshidrotic eczema 11/17/2013  . Diastasis recti 04/08/2013  . Facial nerve paresis 07/31/2012  . Blepharospasm 05/08/2012  . Conductive hearing loss of left ear with unrestricted hearing of right ear  04/08/2012  . Obesity 03/28/2012  . Hemoptysis 12/22/2011  . Neuropathic postherpetic trigeminal neuralgia 12/12/2011  . Allergic rhinitis, mild 10/12/2011  . Disequilibrium syndrome 09/27/2011  . Chronic bronchitis ([place]) 06/05/2011  . Essential hypertension 05/24/2011  . Type 2 diabetes mellitus without complications ([place]) 77/93/9688  . Pulmonary nodules 05/05/2011    Leanah Kolander, MPT 06/05/2019, 3:14 PM  [place] MAIN [facility] SERVICES [address] [place], [place], [address] Phone: [phone]   Fax:  [phone]  Name: Lashena Signer MRN: [id number] Date of Birth: Apr 24, 1951

## 2019-06-09 ENCOUNTER — Encounter: Payer: Medicare Other | Admitting: Occupational Therapy

## 2019-06-10 ENCOUNTER — Ambulatory Visit: Payer: Medicare Other

## 2019-06-10 ENCOUNTER — Other Ambulatory Visit: Payer: Self-pay

## 2019-06-10 ENCOUNTER — Ambulatory Visit: Payer: Medicare Other | Admitting: Occupational Therapy

## 2019-06-10 DIAGNOSIS — I972 Postmastectomy lymphedema syndrome: Secondary | ICD-10-CM

## 2019-06-10 DIAGNOSIS — M6281 Muscle weakness (generalized): Secondary | ICD-10-CM

## 2019-06-10 DIAGNOSIS — R2689 Other abnormalities of gait and mobility: Secondary | ICD-10-CM

## 2019-06-10 DIAGNOSIS — G8929 Other chronic pain: Secondary | ICD-10-CM

## 2019-06-10 NOTE — Therapy (Signed)
[place] MAIN [facility] SERVICES [address] [place], [place], [address] Phone: [phone]   Fax:  [phone]  Physical Therapy Treatment  Patient Details  Name: Susan Callahan MRN: [id number] Date of Birth: 07-15-1951 Referring Provider (PT): Francesco Sor   Encounter Date: 06/10/2019  PT End of Session - 06/10/19 1505    Visit Number  27    Number of Visits  87    Date for PT Re-Evaluation  06/30/19    Authorization Type  8/10 PN on 05/06/19    PT Start Time  1415    PT Stop Time  1500    PT Time Calculation (min)  45 min    Equipment Utilized During Treatment  Gait belt    Activity Tolerance  Patient tolerated treatment well    Behavior During Therapy  [facility] for tasks assessed/performed       Past Medical History:  Diagnosis Date  . COPD (chronic obstructive pulmonary disease) ([place])   . Depression 1974  . Diabetes mellitus without complication ([place])   . Hypertension   . Migraine   . 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 LUMBAR PERITONEAL SHUNT    . TYMPANOSTOMY TUBE PLACEMENT      There were no vitals filed for this visit.  Subjective Assessment - 06/10/19 1434    Subjective  Patient reports she is having sciatic pain in her bilateral calves with L>R. Has been compliant with HEP.    Pertinent History  Pain began in 2007 and has gradually got worse and has progressed to nonstop. Patient states pain last all day long, everyday with no change in symptoms. Medication helps briefly. States she has difficulty with showering and standing. States she has to stand with her knee bent due to pulling sensation in legs. Pain begins immediately when stands and begins to walk. Pain feels better when she bends her knees when standing. Pain became constant and non stop around Dec 2018. Denies bowel or bladder changes, unexplained weakness, or unexpected weight loss/gain. She currently  goes to pulmonary rehab 3x a week, but has increased pain with treadmill walking. Will walk with cane on occasion around house especially at night due to occasional dizziness and vision impairments in medical history. States she also has difficulty with carrying groceries up steps due to low back pain. She would like to be able to walk better and have less pain to be less reliant on medication.     Limitations  Lifting;Standing;Walking;House hold activities    How long can you stand comfortably?  pain begins immediately     How long can you walk comfortably?  pain begins immediately     Patient Stated Goals  walk better, have less pain    Currently in Pain?  Yes    Pain Score  3     Pain Location  Back    Pain Orientation  Lower    Pain Descriptors / Indicators  Radiating    Pain Type  Chronic pain;Neuropathic pain    Pain Radiating Towards  right and left LE    Pain Onset  More than a month ago    Pain Frequency  Intermittent        manual  Roller to lumbar and thoracic paraspinals x 5 minutes.  Calf and hamstring rollout with "the stick" 4 minutes each calf  j swipe mobilization for postural alignment 10 seconds x 3  each level.    Ther-ex: cueing for sequencing, positioning and muscle activation  Hamstring stretch with nerve glides of df/pf 20x each LE Piriformis stretch : with PT overpressure 60 seconds x 2  Seated Posterior pelvic tilts x15. tactile and verbal cue for appropriate technique 5 seconds with hold, improved alignment Seated posterior pelvic tilt with adduction ball squeeze 15x5 second holds Standing calf stretch on treadmill 30 seconds x 2 trials each  Seated TrA activation with upright posture marching 10x each LE Seated large swiss ball TrA activation forward rollout forward, diagonal, 10x each direction, Seated on swiss ball: TrA activation 8x 3 second holds Seated on swiss ball: heel raises 10x each LE with TrA activation    Treadmill 2.0 mph, alternating between  1-2% incline for interval training 7 minutes. Cueing for abdominal activation and sequencing of muscle activation. Able to maintain heel strike and neutral spinal alignment with BUE support.     Pt educated throughout session about proper posture and technique with exercises. Improved exercise technique, movement at target joints, use of target muscles after min to mod verbal, visual, tactile cues   Access Code: [email or web address]  URL: [email or web address]  Date: 06/10/2019  Prepared by: Janna Arch   Exercises CLX Ankle Dorsiflexion and Eversion - 10 reps - 2 sets - 5 hold - 1x daily - 7x weekly Seated Plantar Fascia Mobilization with Small Ball - 10 reps - 2 sets - 5 hold - 1x daily - 7x weekly Plantar Fascia Stretch on Step - 2 reps - 2 sets - 30 hold - 1x daily  - 7x weekly  Next session add new order into current order for gait/balance                      PT Education - 06/10/19 1502    Education Details  exercise technique, body mechanics    Person(s) Educated  Patient    Methods  Explanation;Demonstration;Tactile cues;Verbal cues    Comprehension  Verbalized understanding;Returned demonstration;Verbal cues required;Tactile cues required       PT Short Term Goals - 05/06/19 1738      PT SHORT TERM GOAL #1   Title  Patient will be independent with completion of HEP to improve ability to complete functional tasks and functional ADLs.    Baseline  10/23: will give HEP next session 11/19: HEP compliance    Time  2    Period  Weeks    Status  Achieved      PT SHORT TERM GOAL #2   Title  Patient will report pain score less than 4/10 on VAS to demonstrate reduction of pain levels with functional tasks.     Baseline  10/23: 6/10 11/19: 5/10 12/12: 9/10 1/10: 7/10  2/11: 3/10     Time  2    Period  Weeks    Status  Achieved        PT Long Term Goals - 06/02/19 1658      PT LONG TERM GOAL #1   Title  Patient will report pain score of <3/10 on VAS  for decreased pain levels and ease with functional activities.    Baseline  10/23: 6/10 11/19: 5/10 12/12 9/10 1/10: 7/10 2/11: 3/10 3/9: 6/10  5/26: 8/10 03/17/19: 7/10 8/3: 5/10 8/25: 4/10 9/21: 4/10    Time  4    Period  Weeks    Status  Partially Met    Target Date  06/30/19  PT LONG TERM GOAL #2   Title  Patient will be able to ambulate on treadmill for 16mnutes without increase in back/leg pain demonstrating improved community ambulation and treadmill walking at pulmonary rehab    Baseline  10/23: 017mutes increases pain 11/19: 35m71mtes 12/12: has not had the chance to perform 1/10: was able to do 5 minutes without pain 2/11: able to do 5 minutes then had to bend over 3/9: 5 minutes 30 seconds at 2.0 mphs 5/26: had to walk 10 minutes at cancer center with extreme pain but was able to perform 7/6: walks 10 minutes with increase of pain by 2 points 8/3: able to perform with bent over posture 9/21: able to perform 7 minutes with intervals of 1-2% incline    Time  4    Period  Weeks    Status  Achieved      PT LONG TERM GOAL #3   Title  Patient will have decreased modified oswestry score <50% for improved ability to complete household tasks with less pain levels.     Baseline  10/23: 62% 11/19: 42%      Time  4    Period  Weeks    Status  Achieved      PT LONG TERM GOAL #4   Title  Patient will score 4+/5 on BLE MMT to demonstrate improved glute strength and ability to carry groceries with increased ease up/down stairs at home.    Baseline  10/23: 4-/5 grossly 11/19: 4/5 grossly; hamstrings 4-/5 12/12: 4/5 gross 1/10: 4/5 gross 2/11: gross 4+/5 with hamstrings and abuductors 4-/5 3/9: gross 4+/5 hamstrings and abductors 4-/5 5/27: grossly 3+/5 with pain with LLE movements 7/6: grossly 4-/5 with pain with LLE movement 8/3: deferred due to swelling/pain 8/25: grossly 4/5 without pain 9/21: grossly 4/5 with hamstrings not tested due to spasm    Time  4    Period  Weeks    Status   Partially Met    Target Date  06/30/19      PT LONG TERM GOAL #5   Title  Patient will have decreased modified oswestry score <35% for improved ability to complete household tasks with less pain levels.     Baseline  11/19: 42% 12/12: 52% 1/10: 48%  2/11: 30%     Time  4    Period  Weeks    Status  Achieved      PT LONG TERM GOAL #6   Title  Patient will be able to make and/or strip the bed without pain and without needing rest breaks to perform iADLs independently.     Baseline  2/11: pain limits ability to perform, ~4 breaks required 3/9: takes her time, 3 breaks 5/27: very painful still 7/6: requires only one rest break 8/3: able to perform with +2 pain increase 8/25: no rest breaks required, increased pain 4 points 9/21: able to perform with +1-2 symptoms    Time  4    Period  Weeks    Status  Partially Met    Target Date  06/30/19      PT LONG TERM GOAL #7   Title  Patient will have decreased modified oswestry score <20% for improved ability to complete household tasks with less pain levels.     Baseline  2/11: 30%  3/9: 30% 5/27: 63% 7/6: 38% 8/3: 42% 8/25: 36% 9/21: 30%    Time  4    Period  Weeks    Status  Partially Met  Target Date  06/30/19      PT LONG TERM GOAL #8   Title  Patient will tolerate >10 minutes of upright postural positioning of trunk with active mobility with <2 degrees VAS increase.    Baseline  8/3: unable to tolerate 10  minutes 8/25: tolerates 5 minutes 9/21: tolerates 10 minutes now    Time  4    Period  Weeks    Status  Achieved            Plan - 06/10/19 1506    Clinical Impression Statement  Patient presents with sciatic radiating pain that was eliminated by end of session through manual and therex. Patient demonstrated improved gait mechanics with decreased fatigue during ambulation indicating carryover from previous sessions and use of swiss ball for core stabilization. Patient will continue to benefit from skilled physical therapy to  improve strength, to reduce pain levels with ADLs, and to improve quality of life.    Stability/Clinical Decision Making  Evolving/Moderate complexity    Rehab Potential  Fair    Clinical Impairments Affecting Rehab Potential  (+) motivation (-) chronicity of symptoms    PT Frequency  2x / week    PT Duration  4 weeks    PT Treatment/Interventions  ADLs/Self Care Home Management;Cryotherapy;Moist Heat;Traction;Iontophoresis 67m/ml Dexamethasone;Ultrasound;Gait training;Stair training;Functional mobility training;Neuromuscular re-education;Balance training;Therapeutic exercise;Therapeutic activities;Patient/family education;Manual techniques;Energy conservation;Aquatic Therapy;Electrical Stimulation;Passive range of motion;Dry needling;Taping    PT Next Visit Plan  Next session add new order into current order for gait/balance    Consulted and Agree with Plan of Care  Patient       Patient will benefit from skilled therapeutic intervention in order to improve the following deficits and impairments:  Abnormal gait, Cardiopulmonary status limiting activity, Decreased activity tolerance, Decreased balance, Decreased endurance, Decreased range of motion, Difficulty walking, Decreased strength, Hypomobility, Decreased mobility, Impaired perceived functional ability, Impaired flexibility, Postural dysfunction, Obesity, Improper body mechanics, Pain  Visit Diagnosis: Chronic low back pain, unspecified back pain laterality, unspecified whether sciatica present  Muscle weakness (generalized)  Other abnormalities of gait and mobility     Problem List Patient Active Problem List   Diagnosis Date Noted  . Edema 04/16/2019  . Postoperative wound infection 03/13/2019  . Seroma of breast 03/11/2019  . Age-related osteoporosis without current pathological fracture 02/27/2019  . Malignant neoplasm of central portion of right breast in female, estrogen receptor positive ([place] 02/12/2019  . Genetic  testing 01/31/2019  . Malignant neoplasm of upper-inner quadrant of right breast in female, estrogen receptor positive ([place] 01/15/2019  . Long term current use of opiate analgesic 12/02/2018  . Tympanic membrane central perforation, left 11/04/2018  . DDD (degenerative disc disease), lumbosacral 10/10/2018  . Abnormal MRI, lumbar spine (08/29/2018) 10/10/2018  . 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 Lumbar nerve root impingement (Bilateral: L4 & L5) (L>R: L5) 10/10/2018  . Lumbar Spinal instability of L4-5 10/10/2018  . Lumbar facet hypertrophy 10/10/2018  . COPD (chronic obstructive pulmonary disease) with emphysema ([place] 08/28/2018  . Elevated rheumatoid factor 08/13/2018  . Elevated C-reactive protein (CRP) 08/12/2018  . Elevated sed rate 08/12/2018  . Lumbar facet syndrome (Bilateral) 08/12/2018  . Back pain with left-sided radiculopathy 08/12/2018  . DDD (degenerative disc disease), lumbar 08/12/2018  . Chronic lower extremity pain (Primary Area of Pain) (Bilateral) (L>R) 07/23/2018  . Chronic low back pain (Secondary Area of Pain) (Bilateral) (L>R) w/ sciatica (Bilateral) 07/23/2018  . Chronic pain syndrome 07/23/2018  .  Pharmacologic therapy 07/23/2018  . Disorder of skeletal system 07/23/2018  . Problems influencing health status 07/23/2018  . Chronic sacroiliac joint pain [facility] Area of [facility]) (Bilateral) (L>R) 07/23/2018  . Status post lumboperitoneal shunt placement 02/26/2018  . 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 Tympanic membrane perforation 05/30/2016  . PMB (postmenopausal bleeding) 04/26/2016  . Rash 12/27/2015  . 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 Plantar fasciitis of foot (Left) 05/26/2015  . Spondylosis of lumbar region without myelopathy or radiculopathy 05/26/2015  . Lumbar foraminal stenosis (L3-4, L4-5, L5-S1) (L>R) 05/26/2015  .  Spinal stenosis of lumbar region with neurogenic claudication 05/26/2015  . Encounter for screening mammogram for malignant neoplasm of breast 12/02/2014  . Goiter 12/02/2014  . Chronic idiopathic gout of foot (Right) 07/06/2014  . Biceps tendonitis (Right) 05/19/2014  . Shoulder pain (Right) 05/19/2014  . Facial weakness 05/05/2014  . Post herpetic neuralgia 05/05/2014  . Chronic dysfunction of left eustachian tube 03/24/2014  . Osteopenia 03/13/2014  . Lumbar Grade 1 Anterolisthesis of L3/4 (39m) & L4/5 (4-143m(w/ Dynamic instability) 03/13/2014  . Family history of colon cancer 12/02/2013  . Dyshidrotic eczema 11/17/2013  . Diastasis recti 04/08/2013  . Facial nerve paresis 07/31/2012  . Blepharospasm 05/08/2012  . 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 Obesity 03/28/2012  . Hemoptysis 12/22/2011  . Neuropathic postherpetic trigeminal neuralgia 12/12/2011  . Allergic rhinitis, mild 10/12/2011  . Disequilibrium syndrome 09/27/2011  . Chronic bronchitis ([place]09/24/2012  . Essential hypertension 05/24/2011  . Type 2 diabetes mellitus without complications ([id number]. Pulmonary nodules 05/05/2011   MaJanna ArchPT, DPT   06/10/2019, 3:07 PM  CoMount AetnaAIN REUh [facility] Sh[facility] Re[facility] HospitalERVICES [address]dButterfield ParkNC[place]hone: [phone] Fax:  [phone]: LiKylah MareshRN: [id number] of Birth: 6/14-Aug-1951

## 2019-06-11 NOTE — Therapy (Signed)
[place] MAIN [facility] SERVICES [address] [place], [place], [address] Phone: [phone]   Fax:  [phone]  Occupational Therapy Treatment  Patient Details  Name: Susan Callahan MRN: [id number] Date of Birth: Dec 08, 1950 Referring Provider (OT): Humberto Leep, MD   Encounter Date: 06/10/2019  OT End of Session - 06/10/19 1228    Visit Number  33    Number of Visits  36    Date for OT Re-Evaluation  08/20/19    OT Start Time  0300    OT Stop Time  0400    OT Time Calculation (min)  60 min    Activity Tolerance  Patient tolerated treatment well;No increased pain    Behavior During Therapy  WFL for tasks assessed/performed       Past Medical History:  Diagnosis Date  . COPD (chronic obstructive pulmonary disease) ([place])   . Depression 1974  . Diabetes mellitus without complication ([place])   . Hypertension   . Migraine   . 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 LUMBAR PERITONEAL SHUNT    . TYMPANOSTOMY TUBE PLACEMENT      There were no vitals filed for this visit.  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 Pt reports axillary cording treated last visit is less painful. She  expresses concern re increased hand swelling today.    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 01/30/19 partial mastectomy. Margins clear. 0/2 LN  negative for  disease. actinic keritosis,DDD, DM, obesity, thyroid nodules, pulmonary nodules, chronic back pain, HTN, COPD, hx depression    Limitations  pain and swelling in R axilla and arm, chronic back pain, muscle weakness, impaired balance, decreased RUE AROM, abnormal gait, difficulty walking, impaired functional mobility and transfers,    Repetition  Increases Symptoms    Special Tests  -Stemmer  at MPs bilaterally    Patient Stated Goals  relduce pain and improve movement  in my R arm so I can do things I need to do. reduce risk of lymphedema    Pain Onset  More than a month ago    Pain Onset  More than a month ago                   OT Treatments/Exercises (OP) - 06/11/19 0001      ADLs   ADL Education Given  Yes      Manual Therapy   Manual Therapy  Edema management;Manual Lymphatic Drainage (MLD);Compression Bandaging    Manual therapy comments  Kinesiotape to dorsal and volar hand, wrost and forearm using 4 finger fan cuts and anchoring at proximal FA under custom fabricated glove in effort to passivly stretch skin       to decongest hamnd.    Manual Lymphatic Drainage (MLD)  MLD to RUE/RUQ as established using cntalateral AAA    Compression Bandaging  RUE and hand wrapped using multilayer short stretch wraps over Rosidal foam and OTS cmpression glove. Kinesiotape ( 2" wide fan cuts on end  attached over metacarpals and anchored on wrist extensor muscle bellies to facilitate passive skin stretch to decongest stubborn hand swelling             OT Education - 06/10/19 1228    Education Details  Continued skilled Pt/caregiver education  And LE ADL training throughout visit for lymphedema self care/ home program, including compression wrapping, compression garment and device wear/care, lymphatic pumping ther ex, simple self-MLD, and skin care. Discussed progress towards goals.    Person(s) Educated  Patient    Methods  Demonstration;Explanation;Tactile cues;Verbal cues;Handout    Comprehension  Verbalized understanding;Returned demonstration          OT Long Term Goals - 06/02/19 1609      OT LONG TERM GOAL #1   Title  Pt independent with modified lymphedema precautions and prevention strategies to limit LE progression.    Baseline  Max A    Time  4    Period  Days    Status  Achieved      OT LONG TERM GOAL #2   Title  Pt will be independent with  lymphedema self-care home program and AWS ther ex to improve functional performance of basic and instrumental ADLs by DC.    Baseline  Max A    Time  12    Period  Weeks    Status  Achieved      OT LONG TERM GOAL #3   Title  Pt wil report 0/10 pain with RUE functional use ( reaching, lifting, carrying, dressing, dressing, etc) to achieve independent , pain free ADLs performance.    Baseline  Max A 05/05/19: Reporting 4/10 lymphadema pain now combined with AWS related pain. 05/22/19 AWS pain resolved. Pt now experiencing pain in hand and distal FA associated with recent onset of lymphedema.    Time  12    Period  Weeks    Status  Achieved      OT LONG TERM GOAL #4   Title  Pt will be able to apply gradient compression wraps using correct techniques independently to control UE swelling and limit LE progression.    Baseline  Max A    Time  1    Period  Weeks    Status  Achieved      OT LONG TERM GOAL #5   Title  After skilled trainng Pt will be able to don and doff  appropriate compression garments and/ or devices using correct techniques and assistive devices with extra time  by end of Intensive Phase CDT for optimal LE management to limit progression over time.    Baseline  Max A    Time  12    Period  Weeks    Status  New    Target Date  --   issue date     Long Term Additional Goals   Additional Long Term Goals  Yes      OT LONG TERM GOAL #6   Title  Due to onset of RUE swelling concentrated primarily in R hand during manual therapy for cording  Pt will achieve 5%, or greater RUE  limb volume reductions during Intensive Phase CDT to  improve ADLs performances, to reduce infection risk, and to limit LE progression.    Baseline  Max A    Time  12    Period  Weeks    Status  New    Target Date  08/31/19            Plan - 06/10/19 1229    Clinical Impression Statement  Decreased R hand swelling observed after MLD to RUQ/ RUE shifting lymphatic congestion to alternative L  axilla and R inguinal watersheds.Swelling in the affected R hand and arm continues to  fluctuatewithout known precipitating events. Cont as per POC.    OT Occupational Profile and History  Comprehensive Assessment- Review of records and extensive additional review of physical, cognitive, psychosocial history related to current functional performance    Occupational performance deficits (Please refer to evaluation for details):  ADL's;IADL's;Social Participation;Leisure;Rest and Sleep;Work    Marketing executive / Function / Physical Skills  ADL;Decreased knowledge of precautions;ROM;UE functional use;Decreased knowledge of use of DME;Edema;Skin integrity;Pain;IADL    Rehab Potential  Good    Clinical Decision Making  Several treatment options, min-mod task modification necessary    Comorbidities Affecting Occupational Performance:  Presence of comorbidities impacting occupational performance    Comorbidities impacting occupational performance description:  see SUBJUECTIVE    Modification or Assistance to Complete Evaluation   Min-Moderate modification of tasks or assist with assess necessary to complete eval    OT Frequency  2x / week    OT Duration  12 weeks    OT Treatment/Interventions  Self-care/ADL training;Therapeutic exercise;Manual lymph drainage;Patient/family education;Therapeutic activities;DME and/or AE instruction;Manual Therapy;Other (comment)   consider fitting with prophylactic cmopression arm sleeve   Plan  BUE AROM ther ex to reduce axillary cording- 2 sets of 10 2-3 x daily    Consulted and Agree with Plan of Care  Patient       Patient will benefit from skilled therapeutic intervention in order to improve the following deficits and impairments:   Body Structure / Function / Physical Skills: ADL, Decreased knowledge of precautions, ROM, UE functional use, Decreased knowledge of use of DME, Edema, Skin integrity, Pain, IADL       Visit Diagnosis: Postmastectomy lymphedema  syndrome    Problem List Patient Active Problem List   Diagnosis Date Noted  . Edema 04/16/2019  . Postoperative wound infection 03/13/2019  . Seroma of breast 03/11/2019  . Age-related osteoporosis without current pathological fracture 02/27/2019  . Malignant neoplasm of central portion of right breast in female, estrogen receptor positive ([place]) 02/12/2019  . Genetic testing 01/31/2019  . Malignant neoplasm of upper-inner quadrant of right breast in female, estrogen receptor positive ([place]) 01/15/2019  . Long term current use of opiate analgesic 12/02/2018  . Tympanic membrane central perforation, left 11/04/2018  . DDD (degenerative disc disease), lumbosacral 10/10/2018  . Abnormal MRI, lumbar spine (08/29/2018) 10/10/2018  . 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 Lumbar nerve root impingement (Bilateral: L4 & L5) (L>R: L5) 10/10/2018  . Lumbar Spinal instability of L4-5 10/10/2018  . Lumbar facet hypertrophy 10/10/2018  . COPD (chronic obstructive pulmonary disease) with emphysema ([place]) 08/28/2018  . Elevated rheumatoid factor 08/13/2018  . Elevated C-reactive protein (CRP) 08/12/2018  . Elevated sed rate 08/12/2018  . Lumbar facet syndrome (Bilateral) 08/12/2018  . Back pain with left-sided radiculopathy 08/12/2018  . DDD (degenerative disc disease), lumbar 08/12/2018  . Chronic lower extremity pain (Primary Area of Pain) (Bilateral) (L>R) 07/23/2018  . Chronic low back pain (Secondary Area of Pain) (Bilateral) (L>R) w/ sciatica (Bilateral) 07/23/2018  . Chronic pain syndrome 07/23/2018  . Pharmacologic therapy 07/23/2018  . Disorder of skeletal system 07/23/2018  . Problems influencing health status 07/23/2018  . Chronic sacroiliac joint pain [facility] Pain) (Bilateral) (L>R) 07/23/2018  . Status post lumboperitoneal shunt placement 02/26/2018  . 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  Tympanic membrane perforation 05/30/2016  . PMB (postmenopausal bleeding) 04/26/2016  . Rash 12/27/2015  . Intervertebral disc stenosis of neural canal of lumbar  region 05/26/2015  . Plantar fasciitis of foot (Left) 05/26/2015  . Spondylosis of lumbar region without myelopathy or radiculopathy 05/26/2015  . Lumbar foraminal stenosis (L3-4, L4-5, L5-S1) (L>R) 05/26/2015  . Spinal stenosis of lumbar region with neurogenic claudication 05/26/2015  . Encounter for screening mammogram for malignant neoplasm of breast 12/02/2014  . Goiter 12/02/2014  . Chronic idiopathic gout of foot (Right) 07/06/2014  . Biceps tendonitis (Right) 05/19/2014  . Shoulder pain (Right) 05/19/2014  . Facial weakness 05/05/2014  . Post herpetic neuralgia 05/05/2014  . Chronic dysfunction of left eustachian tube 03/24/2014  . Osteopenia 03/13/2014  . Lumbar Grade 1 Anterolisthesis of L3/4 (52mm) & L4/5 (4-37mm)(w/ Dynamic instability) 03/13/2014  . Family history of colon cancer 12/02/2013  . Dyshidrotic eczema 11/17/2013  . Diastasis recti 04/08/2013  . Facial nerve paresis 07/31/2012  . Blepharospasm 05/08/2012  . 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 Obesity 03/28/2012  . Hemoptysis 12/22/2011  . Neuropathic postherpetic trigeminal neuralgia 12/12/2011  . Allergic rhinitis, mild 10/12/2011  . Disequilibrium syndrome 09/27/2011  . Chronic bronchitis ([place]) 06/05/2011  . Essential hypertension 05/24/2011  . Type 2 diabetes mellitus without complications ([place]) 64/40/3474  . Pulmonary nodules 05/05/2011    Andrey Spearman, MS, OTR/L, [facility] 06/11/19 12:33 PM  [place] MAIN [facility] SERVICES [address] [place], [place], [address] Phone: [phone]   Fax:  [phone]  Name: Susan Callahan MRN: [id number] Date of Birth: [birth year]

## 2019-06-12 ENCOUNTER — Ambulatory Visit: Payer: Medicare Other | Attending: Rehabilitation

## 2019-06-12 ENCOUNTER — Ambulatory Visit: Payer: Medicare Other | Admitting: Occupational Therapy

## 2019-06-12 ENCOUNTER — Encounter: Payer: Self-pay | Admitting: Occupational Therapy

## 2019-06-12 ENCOUNTER — Encounter: Payer: Self-pay | Admitting: Pain Medicine

## 2019-06-12 ENCOUNTER — Other Ambulatory Visit: Payer: Self-pay

## 2019-06-12 DIAGNOSIS — I890 Lymphedema, not elsewhere classified: Secondary | ICD-10-CM | POA: Insufficient documentation

## 2019-06-12 DIAGNOSIS — R2689 Other abnormalities of gait and mobility: Secondary | ICD-10-CM | POA: Diagnosis present

## 2019-06-12 DIAGNOSIS — M6281 Muscle weakness (generalized): Secondary | ICD-10-CM | POA: Diagnosis present

## 2019-06-12 DIAGNOSIS — I972 Postmastectomy lymphedema syndrome: Secondary | ICD-10-CM | POA: Insufficient documentation

## 2019-06-12 DIAGNOSIS — R293 Abnormal posture: Secondary | ICD-10-CM | POA: Insufficient documentation

## 2019-06-12 DIAGNOSIS — M545 Low back pain: Secondary | ICD-10-CM | POA: Diagnosis not present

## 2019-06-12 DIAGNOSIS — R2681 Unsteadiness on feet: Secondary | ICD-10-CM | POA: Insufficient documentation

## 2019-06-12 DIAGNOSIS — G8929 Other chronic pain: Secondary | ICD-10-CM | POA: Diagnosis present

## 2019-06-12 NOTE — Therapy (Signed)
[place] MAIN [facility] SERVICES [address] [place], [place], [address] Phone: [phone]   Fax:  [phone]  Physical Therapy Treatment  Patient Details  Name: Susan Callahan MRN: [id number] Date of Birth: 04-04-1951 Referring Provider (PT): Francesco Sor   Encounter Date: 06/12/2019  PT End of Session - 06/12/19 1509    Visit Number  25    Number of Visits  59    Date for PT Re-Evaluation  07/10/19    Authorization Type  9/10 PN on 05/06/19    PT Start Time  1415    PT Stop Time  1500    PT Time Calculation (min)  45 min    Equipment Utilized During Treatment  Gait belt    Activity Tolerance  Patient tolerated treatment well;Patient limited by fatigue    Behavior During Therapy  [facility] for tasks assessed/performed       Past Medical History:  Diagnosis Date  . COPD (chronic obstructive pulmonary disease) ([place])   . Depression 1974  . Diabetes mellitus without complication ([place])   . Hypertension   . Migraine   . 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 LUMBAR PERITONEAL SHUNT    . TYMPANOSTOMY TUBE PLACEMENT      There were no vitals filed for this visit.  Subjective Assessment - 06/12/19 1506    Subjective  Patient has received additional order for balance to add to current order for lumbar stenosis/back pain    Pertinent History  Patient reports balance issues began in 2008. 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  Balance deficit has been constant since 2008, some days are worse than others but is always present to patient. Does not notice a directionality of loss of balance. Patient reports her balance is affecting her standing, walking on uneven surfaces such as outside, trying to walk with dim light, showering and ADLs. Slight visual deficit of R lower quadrant. 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 Has nystagmus when looking to the R.   Back pain began in 2007 and has gradually worsened to nonstop pain. Medication helps briefly. Patient is challenged with standing with knees extended due to back pain.    Limitations  Lifting;Standing;Walking;House hold activities    How long can you stand comfortably?  pain begins immediately     How long can you walk comfortably?  pain begins immediately     Patient Stated Goals  walk better, have less pain, be able to walk in the grocery store with head turns to scan shelves    Currently in Pain?  Yes    Pain Score  3     Pain Location  Back    Pain Orientation  Lower    Pain Descriptors / Indicators  Aching;Radiating    Pain Type  Chronic pain    Pain Onset  More than a month ago    Pain Frequency  Constant          Patient presents with new order for balance to add to current order of back pain.   Dynamic Sitting Balance  Normal Able to sit unsupported and weight shift across midline maximally   Good Able to sit unsupported and weight shift across midline moderately   Good-/Fair+ Able to sit unsupported and weight shift across midline minimally   Fair Minimal weight shifting ipsilateral/front, difficulty crossing midline x  Fair- Reach to  ipsilateral side and unable to weight shift   Poor + Able to sit unsupported with min A and reach to ipsilateral side, unable to weight shift   Poor Able to sit unsupported with mod A and reach ipsilateral/front-can't cross midline     Standing Dynamic Balance  Normal Stand independently unsupported, able to weight shift and cross midline maximally   Good Stand independently unsupported, able to weight shift and cross midline moderately   Good-/Fair+ Stand independently unsupported, able to weight shift across midline minimally   Fair Stand independently unsupported, weight shift, and reach ipsilaterally, loss of balance when crossing midline x  Poor+ Able to stand with Min A and reach ipsilaterally, unable to weight shift   Poor Able to  stand with Mod A and minimally reach ipsilaterally, unable to cross midline.     Static Sitting Balance  Normal Able to maintain balance against maximal resistance   Good Able to maintain balance against moderate resistance   Good-/Fair+ Accepts minimal resistance x  Fair Able to sit unsupported without balance loss and without UE support   Poor+ Able to maintain with Minimal assistance from individual or chair   Poor Unable to maintain balance-requires mod/max support from individual or chair     Static Standing Balance  Normal Able to maintain standing balance against maximal resistance   Good Able to maintain standing balance against moderate resistance   Good-/Fair+ Able to maintain standing balance against minimal resistance   Fair Able to stand unsupported without UE support and without LOB for 1-2 min x  Fair- Requires Min A and UE support to maintain standing without loss of balance   Poor+ Requires mod A and UE support to maintain standing without loss of balance   Poor Requires max A and UE support to maintain standing balance without loss      6MWT: 580 ft stopped at 3: 58 due to excessive pain in L hip. Noted increase in Trendelenburg and vaulting pattern over LLE. Oxygen dropped to 89% Sp02  BERG: 44/ 56  DGI: 11/ 24   Back pack adjustment: Adjusted to reduce back pain and improve posture for gait mechanics.   Treat added to HEP:   Tape X to the wall at eye level. Focus on X with both eyes. Slowly turn head while maintaining eye contact with X (SMALL MOVEMENT -DON'T LOOSE THE X). 60 seconds. 1x/day.  Stop if nauseous.   Patient has received additional order for balance to add to current order for lumbar stenosis/back pain. Patient demonstrates significant balance deficits as noted in BERG 44/56 and DGI 11/24. Patient is impacted by head movements, single limb support, and turning resulting in limited stability with dynamic mobility.  6 MWT impaired by pain and fatigue  indicating additional limitation of strength with stability. Implementation of strengthing, mobility training,  And vestibular to current POC will benefit patient and decrease her fall risk. Patient will benefit from skilled physical therapy to increase balance, improve strength, decrease pain, and improve functional capacity of mobility for improved quality of life.                     PT Education - 06/12/19 1508    Education Details  VOR seated, goals, POC    Person(s) Educated  Patient    Methods  Explanation;Demonstration;Tactile cues;Verbal cues;Handout    Comprehension  Verbalized understanding;Returned demonstration;Verbal cues required;Tactile cues required       PT Short Term Goals - 06/12/19 1514  PT SHORT TERM GOAL #1   Title  Patient will be independent with completion of HEP to improve ability to complete functional tasks and functional ADLs.    Baseline  10/23: will give HEP next session 11/19: HEP compliance    Time  2    Period  Weeks    Status  Achieved      PT SHORT TERM GOAL #2   Title  Patient will report pain score less than 4/10 on VAS to demonstrate reduction of pain levels with functional tasks.     Baseline  10/23: 6/10 11/19: 5/10 12/12: 9/10 1/10: 7/10  2/11: 3/10     Time  2    Period  Weeks    Status  Achieved        PT Long Term Goals - 06/12/19 1514      PT LONG TERM GOAL #1   Title  Patient will report pain score of <3/10 on VAS for decreased pain levels and ease with functional activities.    Baseline  10/23: 6/10 11/19: 5/10 12/12 9/10 1/10: 7/10 2/11: 3/10 3/9: 6/10  5/26: 8/10 03/17/19: 7/10 8/3: 5/10 8/25: 4/10 9/21: 4/10    Time  4    Period  Weeks    Status  Partially Met    Target Date  07/10/19      PT LONG TERM GOAL #2   Title  Patient will be able to ambulate on treadmill for 100mnutes without increase in back/leg pain demonstrating improved community ambulation and treadmill walking at pulmonary rehab     Baseline  10/23: 064mutes increases pain 11/19: 72m54mtes 12/12: has not had the chance to perform 1/10: was able to do 5 minutes without pain 2/11: able to do 5 minutes then had to bend over 3/9: 5 minutes 30 seconds at 2.0 mphs 5/26: had to walk 10 minutes at cancer center with extreme pain but was able to perform 7/6: walks 10 minutes with increase of pain by 2 points 8/3: able to perform with bent over posture 9/21: able to perform 7 minutes with intervals of 1-2% incline    Time  4    Period  Weeks    Status  Achieved      PT LONG TERM GOAL #3   Title  Patient will increase dynamic gait index score to >19/24 as to demonstrate reduced fall risk and improved dynamic gait balance for better safety with community/home ambulation.    Baseline  10/1: 11/24    Time  4    Period  Weeks    Status  New    Target Date  07/10/19      PT LONG TERM GOAL #4   Title  Patient will score 4+/5 on BLE MMT to demonstrate improved glute strength and ability to carry groceries with increased ease up/down stairs at home.    Baseline  10/23: 4-/5 grossly 11/19: 4/5 grossly; hamstrings 4-/5 12/12: 4/5 gross 1/10: 4/5 gross 2/11: gross 4+/5 with hamstrings and abuductors 4-/5 3/9: gross 4+/5 hamstrings and abductors 4-/5 5/27: grossly 3+/5 with pain with LLE movements 7/6: grossly 4-/5 with pain with LLE movement 8/3: deferred due to swelling/pain 8/25: grossly 4/5 without pain 9/21: grossly 4/5 with hamstrings not tested due to spasm    Time  4    Period  Weeks    Status  Partially Met    Target Date  07/10/19      PT LONG TERM GOAL #5   Title  Patient will increase six minute walk test distance to >1000 with minimal increase of pain for progression to community ambulator and improve gait ability    Baseline  10/1:580 ft stopped at 3: 58 due to excessive pain    Time  4    Period  Weeks    Status  New    Target Date  07/10/19      PT LONG TERM GOAL #6   Title  Patient will be able to make and/or strip the  bed without pain and without needing rest breaks to perform iADLs independently.     Baseline  2/11: pain limits ability to perform, ~4 breaks required 3/9: takes her time, 3 breaks 5/27: very painful still 7/6: requires only one rest break 8/3: able to perform with +2 pain increase 8/25: no rest breaks required, increased pain 4 points 9/21: able to perform with +1-2 symptoms    Time  4    Period  Weeks    Status  Partially Met    Target Date  07/10/19      PT LONG TERM GOAL #7   Title  Patient will have decreased modified oswestry score <20% for improved ability to complete household tasks with less pain levels.     Baseline  2/11: 30%  3/9: 30% 5/27: 63% 7/6: 38% 8/3: 42% 8/25: 36% 9/21: 30%    Time  4    Period  Weeks    Status  Partially Met    Target Date  07/10/19      PT LONG TERM GOAL #8   Title  Patient will increase Berg Balance score by > 6 points (50/56) to demonstrate decreased fall risk during functional activities.    Baseline  10/1: 44/56    Time  4    Period  Weeks    Status  New    Target Date  07/10/19            Plan - 06/12/19 1511    Clinical Impression Statement  Patient has received additional order for balance to add to current order for lumbar stenosis/back pain. Patient demonstrates significant balance deficits as noted in BERG 44/56 and DGI 11/24. Patient is impacted by head movements, single limb support, and turning resulting in limited stability with dynamic mobility.  6 MWT impaired by pain and fatigue indicating additional limitation of strength with stability. Implementation of strengthing, mobility training,  And vestibular to current POC will benefit patient and decrease her fall risk. Patient will benefit from skilled physical therapy to increase balance, improve strength, decrease pain, and improve functional capacity of mobility for improved quality of life.    Personal Factors and Comorbidities  Age;Comorbidity 3+;Fitness;Finances;Past/Current  Experience;Social Background;Time since onset of injury/illness/exacerbation    Comorbidities  COPD, Ramsay Hunt Syndrome, HTN, DM, lymphedema, breast cancer    Examination-Activity Limitations  Bathing;Bend;Carry;Dressing;Hygiene/Grooming;Stairs;Squat;Reach Overhead;Locomotion Level;Lift;Stand;Transfers    Examination-Participation Restrictions  Cleaning;Community Activity;Laundry;Volunteer;Shop;Personal Finances;Yard Work    [employer]  Evolving/Moderate complexity    Clinical Decision Making  Moderate    Rehab Potential  Fair    PT Frequency  2x / week    PT Duration  4 weeks    PT Treatment/Interventions  ADLs/Self Care Home Management;Cryotherapy;Moist Heat;Traction;Iontophoresis 44m/ml Dexamethasone;Ultrasound;Gait training;Stair training;Functional mobility training;Neuromuscular re-education;Balance training;Therapeutic exercise;Therapeutic activities;Patient/family education;Manual techniques;Energy conservation;Aquatic Therapy;Electrical Stimulation;Passive range of motion;Dry needling;Taping;Vestibular;Canalith Repostioning    PT Next Visit Plan  VOR, walking with head turns    PT Home Exercise Plan  VOR  Consulted and Agree with Plan of Care  Patient       Patient will benefit from skilled therapeutic intervention in order to improve the following deficits and impairments:  Abnormal gait, Cardiopulmonary status limiting activity, Decreased activity tolerance, Decreased balance, Decreased endurance, Decreased range of motion, Difficulty walking, Decreased strength, Hypomobility, Decreased mobility, Impaired perceived functional ability, Impaired flexibility, Postural dysfunction, Obesity, Improper body mechanics, Pain, Dizziness, Increased edema  Visit Diagnosis: Chronic low back pain, unspecified back pain laterality, unspecified whether sciatica present  Muscle weakness (generalized)  Other abnormalities of gait and mobility  Unsteadiness on  feet     Problem List Patient Active Problem List   Diagnosis Date Noted  . Edema 04/16/2019  . Postoperative wound infection 03/13/2019  . Seroma of breast 03/11/2019  . Age-related osteoporosis without current pathological fracture 02/27/2019  . Malignant neoplasm of central portion of right breast in female, estrogen receptor positive ([place]) 02/12/2019  . Genetic testing 01/31/2019  . Malignant neoplasm of upper-inner quadrant of right breast in female, estrogen receptor positive ([place]) 01/15/2019  . Long term current use of opiate analgesic 12/02/2018  . Tympanic membrane central perforation, left 11/04/2018  . DDD (degenerative disc disease), lumbosacral 10/10/2018  . Abnormal MRI, lumbar spine (08/29/2018) 10/10/2018  . 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 Lumbar nerve root impingement (Bilateral: L4 & L5) (L>R: L5) 10/10/2018  . Lumbar Spinal instability of L4-5 10/10/2018  . Lumbar facet hypertrophy 10/10/2018  . COPD (chronic obstructive pulmonary disease) with emphysema ([place]) 08/28/2018  . Elevated rheumatoid factor 08/13/2018  . Elevated C-reactive protein (CRP) 08/12/2018  . Elevated sed rate 08/12/2018  . Lumbar facet syndrome (Bilateral) 08/12/2018  . Back pain with left-sided radiculopathy 08/12/2018  . DDD (degenerative disc disease), lumbar 08/12/2018  . Chronic lower extremity pain (Primary Area of Pain) (Bilateral) (L>R) 07/23/2018  . Chronic low back pain (Secondary Area of Pain) (Bilateral) (L>R) w/ sciatica (Bilateral) 07/23/2018  . Chronic pain syndrome 07/23/2018  . Pharmacologic therapy 07/23/2018  . Disorder of skeletal system 07/23/2018  . Problems influencing health status 07/23/2018  . Chronic sacroiliac joint pain [facility] Area of Pain) (Bilateral) (L>R) 07/23/2018  . Status post lumboperitoneal shunt placement 02/26/2018  . 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  Tympanic membrane perforation 05/30/2016  . PMB (postmenopausal bleeding) 04/26/2016  . Rash 12/27/2015  . 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 Plantar fasciitis of foot (Left) 05/26/2015  . Spondylosis of lumbar region without myelopathy or radiculopathy 05/26/2015  . Lumbar foraminal stenosis (L3-4, L4-5, L5-S1) (L>R) 05/26/2015  . Spinal stenosis of lumbar region with neurogenic claudication 05/26/2015  . Encounter for screening mammogram for malignant neoplasm of breast 12/02/2014  . Goiter 12/02/2014  . Chronic idiopathic gout of foot (Right) 07/06/2014  . Biceps tendonitis (Right) 05/19/2014  . Shoulder pain (Right) 05/19/2014  . Facial weakness 05/05/2014  . Post herpetic neuralgia 05/05/2014  . Chronic dysfunction of left eustachian tube 03/24/2014  . Osteopenia 03/13/2014  . Lumbar Grade 1 Anterolisthesis of L3/4 (20m) & L4/5 (4-168m(w/ Dynamic instability) 03/13/2014  . Family history of colon cancer 12/02/2013  . Dyshidrotic eczema 11/17/2013  . Diastasis recti 04/08/2013  . Facial nerve paresis 07/31/2012  . Blepharospasm 05/08/2012  . 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 Obesity 03/28/2012  . Hemoptysis 12/22/2011  . Neuropathic postherpetic trigeminal neuralgia 12/12/2011  . Allergic  rhinitis, mild 10/12/2011  . Disequilibrium syndrome 09/27/2011  . Chronic bronchitis ([place]) 06/05/2011  . Essential hypertension 05/24/2011  . Type 2 diabetes mellitus without complications ([place]) 34/02/8402  . Pulmonary nodules 05/05/2011   Janna Arch, PT, DPT   06/12/2019, 4:57 PM  [place] MAIN [facility] SERVICES [address] [place], [place], [address] Phone: [phone]   Fax:  [phone]  Name: Susan Callahan MRN: [id number] Date of Birth: 1950-12-14

## 2019-06-12 NOTE — Therapy (Signed)
[place] MAIN [facility] SERVICES [address] [place], [place], [address] Phone: [phone]   Fax:  [phone]  Occupational Therapy Treatment  Patient Details  Name: Susan Callahan MRN: [id number] Date of Birth: March 10, 1951 Referring Provider (OT): Humberto Leep, MD   Encounter Date: 06/12/2019  OT End of Session - 06/12/19 1737    Visit Number  34    Number of Visits  36    Date for OT Re-Evaluation  08/20/19    OT Start Time  0305    OT Stop Time  0415    OT Time Calculation (min)  70 min    Activity Tolerance  Patient tolerated treatment well;No increased pain    Behavior During Therapy  WFL for tasks assessed/performed       Past Medical History:  Diagnosis Date  . COPD (chronic obstructive pulmonary disease) ([place])   . Depression 1974  . Diabetes mellitus without complication ([place])   . Hypertension   . Migraine   . 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 LUMBAR PERITONEAL SHUNT    . TYMPANOSTOMY TUBE PLACEMENT      There were no vitals filed for this visit.  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 Pt reports axillary cording continues to hurt in axilla. Hand swelling conrtinues to fluctuate.    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 01/30/19 partial mastectomy. Margins clear. 0/2 LN  negative for  disease. actinic keritosis,DDD, DM, obesity, thyroid nodules, pulmonary nodules, chronic back pain, HTN, COPD, hx depression    Limitations  pain and swelling in R axilla and arm, chronic back pain, muscle weakness, impaired balance, decreased RUE AROM, abnormal gait, difficulty walking, impaired functional mobility and transfers,    Repetition  Increases Symptoms    Special Tests  -Stemmer at MPs bilaterally     Patient Stated Goals  relduce pain and improve movement  in my R arm so I can do things I need to do. reduce risk of lymphedema    Pain Onset  More than a month ago    Pain Onset  More than a month ago                   OT Treatments/Exercises (OP) - 06/12/19 0001      ADLs   ADL Education Given  Yes      Manual Therapy   Manual Therapy  Edema management;Myofascial release;Soft tissue mobilization;Manual Lymphatic Drainage (MLD);Compression Bandaging;Taping    Manual therapy comments  Kinesiotape to dorsal and volar hand, wrost and forearm using 4 finger fan cuts and anchoring at proximal FA under custom fabricated glove in effort to passivly stretch skin       to decongest hamnd.    Manual Lymphatic Drainage (MLD)  MLD to RUE/RUQ as established using cntalateral AAA    Compression Bandaging  RUE and hand wrapped using multilayer short stretch wraps over Rosidal foam and OTS cmpression glove. Kinesiotape ( 2" wide fan cuts on end  attached over metacarpals and anchored on wrist extensor muscle bellies to facilitate passive skin stretch to decongest stubborn hand swelling             OT Education - 06/12/19 1737    Education Details  Continued  skilled Pt/caregiver education  And LE ADL training throughout visit for lymphedema self care/ home program, including compression wrapping, compression garment and device wear/care, lymphatic pumping ther ex, simple self-MLD, and skin care. Discussed progress towards goals.    Person(s) Educated  Patient    Methods  Demonstration;Explanation;Tactile cues;Verbal cues;Handout    Comprehension  Verbalized understanding;Returned demonstration          OT Long Term Goals - 06/02/19 1609      OT LONG TERM GOAL #1   Title  Pt independent with modified lymphedema precautions and prevention strategies to limit LE progression.    Baseline  Max A    Time  4    Period  Days    Status  Achieved      OT LONG TERM GOAL #2   Title  Pt  will be independent with lymphedema self-care home program and AWS ther ex to improve functional performance of basic and instrumental ADLs by DC.    Baseline  Max A    Time  12    Period  Weeks    Status  Achieved      OT LONG TERM GOAL #3   Title  Pt wil report 0/10 pain with RUE functional use ( reaching, lifting, carrying, dressing, dressing, etc) to achieve independent , pain free ADLs performance.    Baseline  Max A 05/05/19: Reporting 4/10 lymphadema pain now combined with AWS related pain. 05/22/19 AWS pain resolved. Pt now experiencing pain in hand and distal FA associated with recent onset of lymphedema.    Time  12    Period  Weeks    Status  Achieved      OT LONG TERM GOAL #4   Title  Pt will be able to apply gradient compression wraps using correct techniques independently to control UE swelling and limit LE progression.    Baseline  Max A    Time  1    Period  Weeks    Status  Achieved      OT LONG TERM GOAL #5   Title  After skilled trainng Pt will be able to don and doff  appropriate compression garments and/ or devices using correct techniques and assistive devices with extra time  by end of Intensive Phase CDT for optimal LE management to limit progression over time.    Baseline  Max A    Time  12    Period  Weeks    Status  New    Target Date  --   issue date     Long Term Additional Goals   Additional Long Term Goals  Yes      OT LONG TERM GOAL #6   Title  Due to onset of RUE swelling concentrated primarily in R hand during manual therapy for cording  Pt will achieve 5%, or greater RUE  limb volume reductions during Intensive Phase CDT to  improve ADLs performances, to reduce infection risk, and to limit LE progression.    Baseline  Max A    Time  12    Period  Weeks    Status  New    Target Date  08/31/19            Plan - 06/12/19 1738    Clinical Impression Statement  Pt tolerated myofacial release techniques to axillary cording extending to distal  arm without difficulty today. Swelling in hand continues to improve with MLD, compression, kinesiotape and wrapping. Cont as per POC.  Fit custom arm garments ASAP.    OT Occupational Profile and History  Comprehensive Assessment- Review of records and extensive additional review of physical, cognitive, psychosocial history related to current functional performance    Occupational performance deficits (Please refer to evaluation for details):  ADL's;IADL's;Social Participation;Leisure;Rest and Sleep;Work    Marketing executive / Function / Physical Skills  ADL;Decreased knowledge of precautions;ROM;UE functional use;Decreased knowledge of use of DME;Edema;Skin integrity;Pain;IADL    Rehab Potential  Good    Clinical Decision Making  Several treatment options, min-mod task modification necessary    Comorbidities Affecting Occupational Performance:  Presence of comorbidities impacting occupational performance    Comorbidities impacting occupational performance description:  see SUBJUECTIVE    Modification or Assistance to Complete Evaluation   Min-Moderate modification of tasks or assist with assess necessary to complete eval    OT Frequency  2x / week    OT Duration  12 weeks    OT Treatment/Interventions  Self-care/ADL training;Therapeutic exercise;Manual lymph drainage;Patient/family education;Therapeutic activities;DME and/or AE instruction;Manual Therapy;Other (comment)   consider fitting with prophylactic cmopression arm sleeve   Plan  BUE AROM ther ex to reduce axillary cording- 2 sets of 10 2-3 x daily    Consulted and Agree with Plan of Care  Patient       Patient will benefit from skilled therapeutic intervention in order to improve the following deficits and impairments:   Body Structure / Function / Physical Skills: ADL, Decreased knowledge of precautions, ROM, UE functional use, Decreased knowledge of use of DME, Edema, Skin integrity, Pain, IADL       Visit Diagnosis: Post-mastectomy  lymphedema syndrome    Problem List Patient Active Problem List   Diagnosis Date Noted  . Edema 04/16/2019  . Postoperative wound infection 03/13/2019  . Seroma of breast 03/11/2019  . Age-related osteoporosis without current pathological fracture 02/27/2019  . Malignant neoplasm of central portion of right breast in female, estrogen receptor positive ([place]) 02/12/2019  . Genetic testing 01/31/2019  . Malignant neoplasm of upper-inner quadrant of right breast in female, estrogen receptor positive ([place]) 01/15/2019  . Long term current use of opiate analgesic 12/02/2018  . Tympanic membrane central perforation, left 11/04/2018  . DDD (degenerative disc disease), lumbosacral 10/10/2018  . Abnormal MRI, lumbar spine (08/29/2018) 10/10/2018  . 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 Lumbar nerve root impingement (Bilateral: L4 & L5) (L>R: L5) 10/10/2018  . Lumbar Spinal instability of L4-5 10/10/2018  . Lumbar facet hypertrophy 10/10/2018  . COPD (chronic obstructive pulmonary disease) with emphysema ([place]) 08/28/2018  . Elevated rheumatoid factor 08/13/2018  . Elevated C-reactive protein (CRP) 08/12/2018  . Elevated sed rate 08/12/2018  . Lumbar facet syndrome (Bilateral) 08/12/2018  . Back pain with left-sided radiculopathy 08/12/2018  . DDD (degenerative disc disease), lumbar 08/12/2018  . Chronic lower extremity pain (Primary Area of Pain) (Bilateral) (L>R) 07/23/2018  . Chronic low back pain (Secondary Area of Pain) (Bilateral) (L>R) w/ sciatica (Bilateral) 07/23/2018  . Chronic pain syndrome 07/23/2018  . Pharmacologic therapy 07/23/2018  . Disorder of skeletal system 07/23/2018  . Problems influencing health status 07/23/2018  . Chronic sacroiliac joint pain [facility] Area of Pain) (Bilateral) (L>R) 07/23/2018  . Status post lumboperitoneal shunt placement 02/26/2018  . Osteoarthritis of first carpometacarpal joint of hand (Left)  05/04/2017  . Tympanic membrane perforation 05/30/2016  . PMB (postmenopausal bleeding) 04/26/2016  . Rash 12/27/2015  . 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  Plantar fasciitis of foot (Left) 05/26/2015  . Spondylosis of lumbar region without myelopathy or radiculopathy 05/26/2015  . Lumbar foraminal stenosis (L3-4, L4-5, L5-S1) (L>R) 05/26/2015  . Spinal stenosis of lumbar region with neurogenic claudication 05/26/2015  . Encounter for screening mammogram for malignant neoplasm of breast 12/02/2014  . Goiter 12/02/2014  . Chronic idiopathic gout of foot (Right) 07/06/2014  . Biceps tendonitis (Right) 05/19/2014  . Shoulder pain (Right) 05/19/2014  . Facial weakness 05/05/2014  . Post herpetic neuralgia 05/05/2014  . Chronic dysfunction of left eustachian tube 03/24/2014  . Osteopenia 03/13/2014  . Lumbar Grade 1 Anterolisthesis of L3/4 (62mm) & L4/5 (4-32mm)(w/ Dynamic instability) 03/13/2014  . Family history of colon cancer 12/02/2013  . Dyshidrotic eczema 11/17/2013  . Diastasis recti 04/08/2013  . Facial nerve paresis 07/31/2012  . Blepharospasm 05/08/2012  . 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 Obesity 03/28/2012  . Hemoptysis 12/22/2011  . Neuropathic postherpetic trigeminal neuralgia 12/12/2011  . Allergic rhinitis, mild 10/12/2011  . Disequilibrium syndrome 09/27/2011  . Chronic bronchitis ([place]) 06/05/2011  . Essential hypertension 05/24/2011  . Type 2 diabetes mellitus without complications ([place]) 30/16/0109  . Pulmonary nodules 05/05/2011    Andrey Spearman, MS, OTR/L, [facility] 06/12/19 5:39 PM   [place] MAIN [facility] SERVICES [address] [place], [place], [address] Phone: [phone]   Fax:  [phone]  Name: Susan Callahan MRN: [id number] Date of Birth: [birth year]

## 2019-06-16 ENCOUNTER — Ambulatory Visit: Payer: Medicare Other | Attending: Pain Medicine | Admitting: Pain Medicine

## 2019-06-16 ENCOUNTER — Ambulatory Visit: Payer: Medicare Other | Admitting: Occupational Therapy

## 2019-06-16 ENCOUNTER — Other Ambulatory Visit: Payer: Self-pay

## 2019-06-16 DIAGNOSIS — I972 Postmastectomy lymphedema syndrome: Secondary | ICD-10-CM

## 2019-06-16 DIAGNOSIS — G8929 Other chronic pain: Secondary | ICD-10-CM

## 2019-06-16 DIAGNOSIS — M5441 Lumbago with sciatica, right side: Secondary | ICD-10-CM

## 2019-06-16 DIAGNOSIS — G894 Chronic pain syndrome: Secondary | ICD-10-CM

## 2019-06-16 DIAGNOSIS — M533 Sacrococcygeal disorders, not elsewhere classified: Secondary | ICD-10-CM

## 2019-06-16 DIAGNOSIS — M5442 Lumbago with sciatica, left side: Secondary | ICD-10-CM

## 2019-06-16 DIAGNOSIS — M79605 Pain in left leg: Secondary | ICD-10-CM

## 2019-06-16 DIAGNOSIS — Z79899 Other long term (current) drug therapy: Secondary | ICD-10-CM

## 2019-06-16 DIAGNOSIS — M79604 Pain in right leg: Secondary | ICD-10-CM | POA: Diagnosis not present

## 2019-06-16 DIAGNOSIS — M545 Low back pain: Secondary | ICD-10-CM | POA: Diagnosis not present

## 2019-06-16 MED ORDER — TRAMADOL HCL 50 MG PO TABS: 50.0000 mg | ORAL_TABLET | Freq: Four times a day (QID) | ORAL | 2 refills | Status: DC | PRN

## 2019-06-16 NOTE — Progress Notes (Signed)
Pain Management Virtual Encounter Note - Virtual Visit via Telephone Telehealth (real-time audio visits between healthcare provider and patient).   Patient's Phone No. & Preferred Pharmacy:  [phone] (home); [phone] (mobile); (Preferred) [phone] [email or web address]  [place], [place] [address] Phone: [phone] Fax: [phone]    Pre-screening note:  Our staff contacted Ms. Cambre and offered her an "in person", "face-to-face" appointment versus a telephone encounter. She indicated preferring the telephone encounter, at this time.   Reason for Virtual Visit: COVID-19*  Social distancing based on CDC and AMA recommendations.   I contacted Timoteo Ace on 06/16/2019 via telephone.      I clearly identified myself as Gaspar Cola, MD. I verified that I was speaking with the correct person using two identifiers (Name: Graelyn Bihl, and date of birth: Sep 24, 1950).  Advanced Informed Consent I sought verbal advanced consent from Timoteo Ace for virtual visit interactions. I informed Ms. Ellingsen of possible security and privacy concerns, risks, and limitations associated with providing "not-in-person" medical evaluation and management services. I also informed Ms. Kuklinski of the availability of "in-person" appointments. Finally, I informed her that there would be a charge for the virtual visit and that she could be  personally, fully or partially, financially responsible for it. Ms. Hawkey expressed understanding and agreed to proceed.   Historic Elements   Ms. Ceola Para is a 68 y.o. year old, female patient evaluated today after her last encounter by our practice on 04/29/2019. Ms. Tuch  has a past medical history of COPD (chronic obstructive pulmonary disease) ([place]), Depression (1974), Diabetes mellitus without complication ([place]), Hypertension, Migraine,  and Ramsay Hunt auricular syndrome. She also  has a past surgical history that includes Tympanostomy tube placement and Lumbar peritoneal shunt. Ms. Dobies has a current medication list which includes the following prescription(s): acetazolamide, allopurinol, amitriptyline, atorvastatin, betamethasone dipropionate, budesonide, vitamin d, epinephrine, furosemide, gabapentin, metformin, multivitamin, mupirocin ointment, perforomist, tramadol, b complex vitamins, calcium carbonate-vitamin d, magnesium oxide, and verapamil. She  reports that she quit smoking about 28 years ago. Her smoking use included cigarettes. She has a 81.00 pack-year smoking history. She has never used smokeless tobacco. She reports that she does not drink alcohol or use drugs. Ms. Plumb is allergic to sulfa antibiotics and bee venom.   HPI  Today, she is being contacted for medication management.  The patient indicates doing well with the current medication regimen. No adverse reactions or side effects reported to the medications.   Pharmacotherapy Assessment  Analgesic: Tramadol 50 mg, 1 tab PO q 6 hrs (200 mg/day of tramadol)(enough refills until 06/16/19) MME/day:20mg /day.   Monitoring: Pharmacotherapy: No side-effects or adverse reactions reported. [place] PMP: PDMP reviewed during this encounter.       Compliance: No problems identified. Effectiveness: Clinically acceptable. Plan: Refer to "POC".  UDS:  Summary  Date Value Ref Range Status  12/02/2018 FINAL  Final    Comment:    ==================================================================== TOXASSURE SELECT 13 (MW) ==================================================================== Test                             Result       Flag       Units Drug Present   Tramadol                       [phone]  ng/mg creat   O-Desmethyltramadol            7705                    ng/mg creat   N-Desmethyltramadol            1902                    ng/mg  creat    Source of tramadol is a prescription medication.    O-desmethyltramadol and N-desmethyltramadol are expected    metabolites of tramadol. ==================================================================== Test                      Result    Flag   Units      Ref Range   Creatinine              58               mg/dL      >=20 ==================================================================== Declared Medications:  Medication list was not provided. ==================================================================== For clinical consultation, please call [phone]. ====================================================================    Laboratory Chemistry Profile (12 mo)  Renal: 07/23/2018: BUN 21; BUN/Creatinine Ratio 20; Creatinine, Ser 1.05  Lab Results  Component Value Date   GFRAA 64 07/23/2018   GFRNONAA 55 (L) 07/23/2018   Hepatic: 07/23/2018: Albumin 4.7 Lab Results  Component Value Date   AST 17 07/23/2018   Other: 07/23/2018: 25-Hydroxy, Vitamin D 36; 25-Hydroxy, Vitamin D-2 <1.0; 25-Hydroxy, Vitamin D-3 36; CRP 23; Sed Rate 43; Vitamin B-12 603 Note: Above Lab results reviewed.  Imaging  Last 90 days:  Dg Pain Clinic C-arm 1-60 Min No Report  Result Date: 04/01/2019 Fluoro was used, but no Radiologist interpretation will be provided. Please refer to "NOTES" tab for provider progress note.   Assessment  The primary encounter diagnosis was Chronic pain syndrome. Diagnoses of Chronic lower extremity pain (Primary Area of Pain) (Bilateral) (L>R), Chronic low back pain (Secondary Area of Pain) (Bilateral) (L>R) w/ sciatica (Bilateral), Chronic sacroiliac joint pain (Tertiary Area of Pain) (Bilateral) (L>R), and Pharmacologic therapy were also pertinent to this visit.  Plan of Care  I have discontinued Sonia Side. Schollmeyer "Dustine Iannaccone"'s letrozole and exemestane. I am also having her maintain her allopurinol, amitriptyline, b complex vitamins, Calcium  Carbonate-Vitamin D, furosemide, gabapentin, magnesium oxide, metFORMIN, multivitamin, verapamil, mupirocin ointment, acetaZOLAMIDE, Vitamin D, atorvastatin, betamethasone dipropionate, budesonide, EPINEPHrine, Perforomist, and traMADol.  Pharmacotherapy (Medications Ordered): Meds ordered this encounter  Medications  . traMADol (ULTRAM) 50 MG tablet    Sig: Take 1 tablet (50 mg total) by mouth every 6 (six) hours as needed for severe pain.    Dispense:  120 tablet    Refill:  2    Chronic Pain: STOP Act (Not applicable) Fill 1 day early if closed on refill date. Do not fill until: 06/16/2019. To last until: 12/13/2019. Avoid benzodiazepines within 8 hours of opioids   Orders:  Orders Placed This Encounter  Procedures  . Comp. Metabolic Panel (12)    With GFR. Indications: Chronic Pain Syndrome (G89.4) & Pharmacotherapy (E33.295)    Order Specific Question:   Has the patient fasted?    Answer:   No    Order Specific Question:   CC Results    Answer:   PCP-NURSE [[id number]]   Follow-up plan:   Return in about 6 months (around 12/10/2019) for (VV), (MM) check on lab work, in addition, PRN Procedure(s).  Interventional management options:  Considering:   CAUTION: Has a LUMBOPERITONEAL SPINAL SHUNT entering the canal at L2-3 on left and going up.  Diagnostic Caudal ESI #1  Diagnostic bilateral lumbar facet nerve block #1  Possible bilateral lumbar facet RFA #1    PRN Procedures:   Palliative bilateral L4 TFESI #3  Palliative left L3-4 LESI #3     Recent Visits Date Type Provider Dept  04/29/19 Office Visit Milinda Pointer, MD [facility]  04/01/19 Procedure visit Milinda Pointer, MD [facility]  Showing recent visits within past 90 days and meeting all other requirements   Today's Visits Date Type Provider Dept  06/16/19 Office Visit Milinda Pointer, MD [facility]  Showing today's visits and meeting all other requirements   Future  Appointments No visits were found meeting these conditions.  Showing future appointments within next 90 days and meeting all other requirements   I discussed the assessment and treatment plan with the patient. The patient was provided an opportunity to ask questions and all were answered. The patient agreed with the plan and demonstrated an understanding of the instructions.  Patient advised to call back or seek an in-person evaluation if the symptoms or condition worsens.  Total duration of non-face-to-face encounter: 12 minutes.  Note by: Gaspar Cola, MD Date: 06/16/2019; Time: 10:05 AM  Note: This dictation was prepared with Dragon dictation. Any transcriptional errors that may result from this process are unintentional.  Disclaimer:  * Given the special circumstances of the COVID-19 pandemic, the federal government has announced that the Office for Civil Rights (OCR) will exercise its enforcement discretion and will not impose penalties on physicians using telehealth in the event of noncompliance with regulatory requirements under the [place] and [place] (HIPAA) in connection with the good faith provision of telehealth during the LTRVU-02 national public health emergency. ([place])

## 2019-06-16 NOTE — Patient Instructions (Signed)

## 2019-06-17 ENCOUNTER — Ambulatory Visit: Payer: Medicare Other

## 2019-06-17 ENCOUNTER — Other Ambulatory Visit: Payer: Self-pay

## 2019-06-17 DIAGNOSIS — G8929 Other chronic pain: Secondary | ICD-10-CM

## 2019-06-17 DIAGNOSIS — M6281 Muscle weakness (generalized): Secondary | ICD-10-CM

## 2019-06-17 DIAGNOSIS — R2681 Unsteadiness on feet: Secondary | ICD-10-CM

## 2019-06-17 DIAGNOSIS — M545 Low back pain, unspecified: Secondary | ICD-10-CM

## 2019-06-17 DIAGNOSIS — R293 Abnormal posture: Secondary | ICD-10-CM

## 2019-06-17 DIAGNOSIS — R2689 Other abnormalities of gait and mobility: Secondary | ICD-10-CM

## 2019-06-17 NOTE — Therapy (Signed)
[place] MAIN [facility] SERVICES [address] [place], [place], [address] Phone: [phone]   Fax:  [phone]  Physical Therapy Treatment Physical Therapy Progress Note   Dates of reporting period  05/06/19  to   06/17/19  Patient Details  Name: Susan Callahan MRN: [id number] Date of Birth: 04/06/1951 Referring Provider (PT): Francesco Sor   Encounter Date: 06/17/2019    Past Medical History:  Diagnosis Date  . COPD (chronic obstructive pulmonary disease) ([place])   . Depression 1974  . Diabetes mellitus without complication ([place])   . Hypertension   . Migraine   . 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 LUMBAR PERITONEAL SHUNT    . TYMPANOSTOMY TUBE PLACEMENT      There were no vitals filed for this visit.        Treatment:  Prone:  Roller to thoracolumbar paraspinals, piriformis, hamstrings, and calves x 5 minutes  Treadmill 2.0 mph,alternating between 1-2% incline for interval training69mnutes.Cueing for abdominal activation and sequencing of muscle activation.Able to maintain heel strike and neutral spinal alignment with BUE support.  Seated: Hamstring stretch on side of treadmill 60 seconds each LE  pressing swiss ball between arms and legs for TrA/core activation 15x 3 second holds with verbal cueing for exhale with muscle activation   RTB palloff press 12x each LE  Review of HEP: VORx1  Standing:  airex pad: static stand 2x 30 second holds, noted instability of ankles  airex pad: one foot on 7" step one foot on airex pad; hold 30 seconds each position with two finger support x 2 each side  Pt educated throughout session about proper posture and technique with exercises. Improved exercise technique, movement at target joints, use of target muscles after min to mod verbal, visual, tactile cues    Patient's condition has the potential to improve in  response to therapy. Maximum improvement is yet to be obtained. The anticipated improvement is attainable and reasonable in a generally predictable time.  Patient reports that she is improving in quality of life and mobility with decreased pain.     Patient has updated goals/additional orders added on 06/12/19, please refer to this note for further reference on progress and new POC. Stability interventions added this session for first time with patient tolerating them well. She is challenged by unstable surfaces at this time with noticeable limitations of ankle righting reactions. Patient's condition has the potential to improve in response to therapy. Maximum improvement is yet to be obtained. The anticipated improvement is attainable and reasonable in a generally predictable time.Patient will benefit from skilled physical therapy to increase balance, improve strength, decrease pain, and improve functional capacity of mobility for improved quality of life.               PT Short Term Goals - 06/12/19 1514      PT SHORT TERM GOAL #1   Title  Patient will be independent with completion of HEP to improve ability to complete functional tasks and functional ADLs.    Baseline  10/23: will give HEP next session 11/19: HEP compliance    Time  2    Period  Weeks    Status  Achieved      PT SHORT TERM GOAL #2   Title  Patient will report pain score less than 4/10 on VAS to demonstrate reduction of pain levels with functional tasks.  Baseline  10/23: 6/10 11/19: 5/10 12/12: 9/10 1/10: 7/10  2/11: 3/10     Time  2    Period  Weeks    Status  Achieved        PT Long Term Goals - 06/12/19 1514      PT LONG TERM GOAL #1   Title  Patient will report pain score of <3/10 on VAS for decreased pain levels and ease with functional activities.    Baseline  10/23: 6/10 11/19: 5/10 12/12 9/10 1/10: 7/10 2/11: 3/10 3/9: 6/10  5/26: 8/10 03/17/19: 7/10 8/3: 5/10 8/25: 4/10 9/21: 4/10    Time  4     Period  Weeks    Status  Partially Met    Target Date  07/10/19      PT LONG TERM GOAL #2   Title  Patient will be able to ambulate on treadmill for 74mnutes without increase in back/leg pain demonstrating improved community ambulation and treadmill walking at pulmonary rehab    Baseline  10/23: 017mutes increases pain 11/19: 52m22mtes 12/12: has not had the chance to perform 1/10: was able to do 5 minutes without pain 2/11: able to do 5 minutes then had to bend over 3/9: 5 minutes 30 seconds at 2.0 mphs 5/26: had to walk 10 minutes at cancer center with extreme pain but was able to perform 7/6: walks 10 minutes with increase of pain by 2 points 8/3: able to perform with bent over posture 9/21: able to perform 7 minutes with intervals of 1-2% incline    Time  4    Period  Weeks    Status  Achieved      PT LONG TERM GOAL #3   Title  Patient will increase dynamic gait index score to >19/24 as to demonstrate reduced fall risk and improved dynamic gait balance for better safety with community/home ambulation.    Baseline  10/1: 11/24    Time  4    Period  Weeks    Status  New    Target Date  07/10/19      PT LONG TERM GOAL #4   Title  Patient will score 4+/5 on BLE MMT to demonstrate improved glute strength and ability to carry groceries with increased ease up/down stairs at home.    Baseline  10/23: 4-/5 grossly 11/19: 4/5 grossly; hamstrings 4-/5 12/12: 4/5 gross 1/10: 4/5 gross 2/11: gross 4+/5 with hamstrings and abuductors 4-/5 3/9: gross 4+/5 hamstrings and abductors 4-/5 5/27: grossly 3+/5 with pain with LLE movements 7/6: grossly 4-/5 with pain with LLE movement 8/3: deferred due to swelling/pain 8/25: grossly 4/5 without pain 9/21: grossly 4/5 with hamstrings not tested due to spasm    Time  4    Period  Weeks    Status  Partially Met    Target Date  07/10/19      PT LONG TERM GOAL #5   Title  Patient will increase six minute walk test distance to >1000 with minimal increase of pain  for progression to community ambulator and improve gait ability    Baseline  10/1:580 ft stopped at 3: 58 due to excessive pain    Time  4    Period  Weeks    Status  New    Target Date  07/10/19      PT LONG TERM GOAL #6   Title  Patient will be able to make and/or strip the bed without pain and without needing rest breaks  to perform iADLs independently.     Baseline  2/11: pain limits ability to perform, ~4 breaks required 3/9: takes her time, 3 breaks 5/27: very painful still 7/6: requires only one rest break 8/3: able to perform with +2 pain increase 8/25: no rest breaks required, increased pain 4 points 9/21: able to perform with +1-2 symptoms    Time  4    Period  Weeks    Status  Partially Met    Target Date  07/10/19      PT LONG TERM GOAL #7   Title  Patient will have decreased modified oswestry score <20% for improved ability to complete household tasks with less pain levels.     Baseline  2/11: 30%  3/9: 30% 5/27: 63% 7/6: 38% 8/3: 42% 8/25: 36% 9/21: 30%    Time  4    Period  Weeks    Status  Partially Met    Target Date  07/10/19      PT LONG TERM GOAL #8   Title  Patient will increase Berg Balance score by > 6 points (50/56) to demonstrate decreased fall risk during functional activities.    Baseline  10/1: 44/56    Time  4    Period  Weeks    Status  New    Target Date  07/10/19              Patient will benefit from skilled therapeutic intervention in order to improve the following deficits and impairments:     Visit Diagnosis: No diagnosis found.     Problem List Patient Active Problem List   Diagnosis Date Noted  . Edema 04/16/2019  . Postoperative wound infection 03/13/2019  . Seroma of breast 03/11/2019  . Age-related osteoporosis without current pathological fracture 02/27/2019  . Malignant neoplasm of central portion of right breast in female, estrogen receptor positive ([place]) 02/12/2019  . Genetic testing 01/31/2019  . Malignant neoplasm  of upper-inner quadrant of right breast in female, estrogen receptor positive ([place]) 01/15/2019  . Long term current use of opiate analgesic 12/02/2018  . Tympanic membrane central perforation, left 11/04/2018  . DDD (degenerative disc disease), lumbosacral 10/10/2018  . Abnormal MRI, lumbar spine (08/29/2018) 10/10/2018  . 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 Lumbar nerve root impingement (Bilateral: L4 & L5) (L>R: L5) 10/10/2018  . Lumbar Spinal instability of L4-5 10/10/2018  . Lumbar facet hypertrophy 10/10/2018  . COPD (chronic obstructive pulmonary disease) with emphysema ([place]) 08/28/2018  . Elevated rheumatoid factor 08/13/2018  . Elevated C-reactive protein (CRP) 08/12/2018  . Elevated sed rate 08/12/2018  . Lumbar facet syndrome (Bilateral) 08/12/2018  . Back pain with left-sided radiculopathy 08/12/2018  . DDD (degenerative disc disease), lumbar 08/12/2018  . Chronic lower extremity pain (Primary Area of Pain) (Bilateral) (L>R) 07/23/2018  . Chronic low back pain (Secondary Area of Pain) (Bilateral) (L>R) w/ sciatica (Bilateral) 07/23/2018  . Chronic pain syndrome 07/23/2018  . Pharmacologic therapy 07/23/2018  . Disorder of skeletal system 07/23/2018  . Problems influencing health status 07/23/2018  . Chronic sacroiliac joint pain [facility] Area of Pain) (Bilateral) (L>R) 07/23/2018  . Status post lumboperitoneal shunt placement 02/26/2018  . 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 Tympanic membrane perforation 05/30/2016  . PMB (postmenopausal bleeding) 04/26/2016  . Rash 12/27/2015  . 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 Plantar fasciitis of foot (Left) 05/26/2015  . Spondylosis of  lumbar region without myelopathy or radiculopathy 05/26/2015  . Lumbar foraminal stenosis (L3-4, L4-5, L5-S1) (L>R) 05/26/2015  . Spinal stenosis of lumbar region with  neurogenic claudication 05/26/2015  . Encounter for screening mammogram for malignant neoplasm of breast 12/02/2014  . Goiter 12/02/2014  . Chronic idiopathic gout of foot (Right) 07/06/2014  . Biceps tendonitis (Right) 05/19/2014  . Shoulder pain (Right) 05/19/2014  . Facial weakness 05/05/2014  . Post herpetic neuralgia 05/05/2014  . Chronic dysfunction of left eustachian tube 03/24/2014  . Osteopenia 03/13/2014  . Lumbar Grade 1 Anterolisthesis of L3/4 (29m) & L4/5 (4-132m(w/ Dynamic instability) 03/13/2014  . Family history of colon cancer 12/02/2013  . Dyshidrotic eczema 11/17/2013  . Diastasis recti 04/08/2013  . Facial nerve paresis 07/31/2012  . Blepharospasm 05/08/2012  . 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 Obesity 03/28/2012  . Hemoptysis 12/22/2011  . Neuropathic postherpetic trigeminal neuralgia 12/12/2011  . Allergic rhinitis, mild 10/12/2011  . Disequilibrium syndrome 09/27/2011  . Chronic bronchitis ([facility]09/24/2012  . Essential hypertension 05/24/2011  . Type 2 diabetes mellitus without complications ([place]64/40/3474. Pulmonary nodules 05/05/2011   MaJanna ArchPT, DPT   06/17/2019, 4:05 PM  [place] [facility] [address] [place]hone: [phone] Fax:  [phone]: LiAnnalie WennerRN: [id number] of Birth: 02/1951/06/12

## 2019-06-17 NOTE — Therapy (Signed)
[place] MAIN [facility] SERVICES [address] [place], [place], [address] Phone: [phone]   Fax:  [phone]  Occupational Therapy Treatment  Patient Details  Name: Susan Callahan MRN: [id number] Date of Birth: 07-Jan-1951 Referring Provider (OT): Humberto Leep, MD   Encounter Date: 06/16/2019  OT End of Session - 06/16/19 1226    Visit Number  35    Number of Visits  36    Date for OT Re-Evaluation  08/20/19    OT Start Time  0105    OT Stop Time  0205    OT Time Calculation (min)  60 min    Activity Tolerance  Patient tolerated treatment well;No increased pain    Behavior During Therapy  WFL for tasks assessed/performed       Past Medical History:  Diagnosis Date  . COPD (chronic obstructive pulmonary disease) ([place])   . Depression 1974  . Diabetes mellitus without complication ([place])   . Hypertension   . Migraine   . 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 LUMBAR PERITONEAL SHUNT    . TYMPANOSTOMY TUBE PLACEMENT      There were no vitals filed for this visit.  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 Hand swelling persists. Axillary cording persists.    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 01/30/19 partial mastectomy. Margins clear. 0/2 LN  negative for  disease. actinic keritosis,DDD, DM, obesity, thyroid nodules, pulmonary nodules, chronic back pain, HTN, COPD, hx depression    Limitations  pain and swelling in R axilla and arm, chronic back pain, muscle weakness, impaired balance, decreased RUE AROM, abnormal gait, difficulty walking, impaired functional mobility and transfers,    Repetition  Increases Symptoms    Special Tests  -Stemmer at MPs bilaterally    Patient Stated Goals  relduce pain and improve  movement  in my R arm so I can do things I need to do. reduce risk of lymphedema    Pain Onset  More than a month ago    Pain Onset  More than a month ago                           OT Education - 06/17/19 1226    Education Details  Continued skilled Pt/caregiver education  And LE ADL training throughout visit for lymphedema self care/ home program, including compression wrapping, compression garment and device wear/care, lymphatic pumping ther ex, simple self-MLD, and skin care. Discussed progress towards goals.    Person(s) Educated  Patient    Methods  Demonstration;Explanation;Tactile cues;Verbal cues;Handout    Comprehension  Verbalized understanding;Returned demonstration          OT Long Term Goals - 06/02/19 1609      OT LONG TERM GOAL #1   Title  Pt independent with modified lymphedema precautions and prevention strategies to limit LE progression.    Baseline  Max A    Time  4    Period  Days    Status  Achieved      OT LONG TERM GOAL #2   Title  Pt will be independent with lymphedema self-care home program and AWS ther ex to improve functional performance of basic and instrumental ADLs  by DC.    Baseline  Max A    Time  12    Period  Weeks    Status  Achieved      OT LONG TERM GOAL #3   Title  Pt wil report 0/10 pain with RUE functional use ( reaching, lifting, carrying, dressing, dressing, etc) to achieve independent , pain free ADLs performance.    Baseline  Max A 05/05/19: Reporting 4/10 lymphadema pain now combined with AWS related pain. 05/22/19 AWS pain resolved. Pt now experiencing pain in hand and distal FA associated with recent onset of lymphedema.    Time  12    Period  Weeks    Status  Achieved      OT LONG TERM GOAL #4   Title  Pt will be able to apply gradient compression wraps using correct techniques independently to control UE swelling and limit LE progression.    Baseline  Max A    Time  1    Period  Weeks    Status  Achieved       OT LONG TERM GOAL #5   Title  After skilled trainng Pt will be able to don and doff  appropriate compression garments and/ or devices using correct techniques and assistive devices with extra time  by end of Intensive Phase CDT for optimal LE management to limit progression over time.    Baseline  Max A    Time  12    Period  Weeks    Status  New    Target Date  --   issue date     Long Term Additional Goals   Additional Long Term Goals  Yes      OT LONG TERM GOAL #6   Title  Due to onset of RUE swelling concentrated primarily in R hand during manual therapy for cording  Pt will achieve 5%, or greater RUE  limb volume reductions during Intensive Phase CDT to  improve ADLs performances, to reduce infection risk, and to limit LE progression.    Baseline  Max A    Time  12    Period  Weeks    Status  New    Target Date  08/31/19            Plan - 06/16/19 1227    Clinical Impression Statement  Continued RUE/ RUQ MLD directing congestion anteriorly towards contra laterally anastomosis and inferiorly towards subclavicular LN. Hand swelling continues to fluctuate and persist. Pt feels results are improved with kinesiotape beneath compression wraps, so we continued that today volarly and dorsally on hand. Cont as per OPC. Will fit custom sleeve and glove as soon as available. DME vendor reported  yesterday that they are ordered.    OT Occupational Profile and History  Comprehensive Assessment- Review of records and extensive additional review of physical, cognitive, psychosocial history related to current functional performance    Occupational performance deficits (Please refer to evaluation for details):  ADL's;IADL's;Social Participation;Leisure;Rest and Sleep;Work    Marketing executive / Function / Physical Skills  ADL;Decreased knowledge of precautions;ROM;UE functional use;Decreased knowledge of use of DME;Edema;Skin integrity;Pain;IADL    Rehab Potential  Good    Clinical Decision  Making  Several treatment options, min-mod task modification necessary    Comorbidities Affecting Occupational Performance:  Presence of comorbidities impacting occupational performance    Comorbidities impacting occupational performance description:  see SUBJUECTIVE    Modification or Assistance to Complete Evaluation   Min-Moderate modification of tasks  or assist with assess necessary to complete eval    OT Frequency  2x / week    OT Duration  12 weeks    OT Treatment/Interventions  Self-care/ADL training;Therapeutic exercise;Manual lymph drainage;Patient/family education;Therapeutic activities;DME and/or AE instruction;Manual Therapy;Other (comment)   consider fitting with prophylactic cmopression arm sleeve   Plan  BUE AROM ther ex to reduce axillary cording- 2 sets of 10 2-3 x daily    Consulted and Agree with Plan of Care  Patient       Patient will benefit from skilled therapeutic intervention in order to improve the following deficits and impairments:   Body Structure / Function / Physical Skills: ADL, Decreased knowledge of precautions, ROM, UE functional use, Decreased knowledge of use of DME, Edema, Skin integrity, Pain, IADL       Visit Diagnosis: Postmastectomy lymphedema syndrome    Problem List Patient Active Problem List   Diagnosis Date Noted  . Edema 04/16/2019  . Postoperative wound infection 03/13/2019  . Seroma of breast 03/11/2019  . Age-related osteoporosis without current pathological fracture 02/27/2019  . Malignant neoplasm of central portion of right breast in female, estrogen receptor positive ([place]) 02/12/2019  . Genetic testing 01/31/2019  . Malignant neoplasm of upper-inner quadrant of right breast in female, estrogen receptor positive ([place]) 01/15/2019  . Long term current use of opiate analgesic 12/02/2018  . Tympanic membrane central perforation, left 11/04/2018  . DDD (degenerative disc disease), lumbosacral 10/10/2018  . Abnormal MRI, lumbar  spine (08/29/2018) 10/10/2018  . 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 Lumbar nerve root impingement (Bilateral: L4 & L5) (L>R: L5) 10/10/2018  . Lumbar Spinal instability of L4-5 10/10/2018  . Lumbar facet hypertrophy 10/10/2018  . COPD (chronic obstructive pulmonary disease) with emphysema ([place]) 08/28/2018  . Elevated rheumatoid factor 08/13/2018  . Elevated C-reactive protein (CRP) 08/12/2018  . Elevated sed rate 08/12/2018  . Lumbar facet syndrome (Bilateral) 08/12/2018  . Back pain with left-sided radiculopathy 08/12/2018  . DDD (degenerative disc disease), lumbar 08/12/2018  . Chronic lower extremity pain (Primary Area of Pain) (Bilateral) (L>R) 07/23/2018  . Chronic low back pain (Secondary Area of Pain) (Bilateral) (L>R) w/ sciatica (Bilateral) 07/23/2018  . Chronic pain syndrome 07/23/2018  . Pharmacologic therapy 07/23/2018  . Disorder of skeletal system 07/23/2018  . Problems influencing health status 07/23/2018  . Chronic sacroiliac joint pain [facility] Area of Pain) (Bilateral) (L>R) 07/23/2018  . Status post lumboperitoneal shunt placement 02/26/2018  . 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 Tympanic membrane perforation 05/30/2016  . PMB (postmenopausal bleeding) 04/26/2016  . Rash 12/27/2015  . 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 Plantar fasciitis of foot (Left) 05/26/2015  . Spondylosis of lumbar region without myelopathy or radiculopathy 05/26/2015  . Lumbar foraminal stenosis (L3-4, L4-5, L5-S1) (L>R) 05/26/2015  . Spinal stenosis of lumbar region with neurogenic claudication 05/26/2015  . Encounter for screening mammogram for malignant neoplasm of breast 12/02/2014  . Goiter 12/02/2014  . Chronic idiopathic gout of foot (Right) 07/06/2014  . Biceps tendonitis (Right) 05/19/2014  . Shoulder pain (Right) 05/19/2014  . Facial weakness 05/05/2014   . Post herpetic neuralgia 05/05/2014  . Chronic dysfunction of left eustachian tube 03/24/2014  . Osteopenia 03/13/2014  . Lumbar Grade 1 Anterolisthesis of L3/4 (10mm) & L4/5 (4-33mm)(w/ Dynamic instability) 03/13/2014  . Family history of colon cancer 12/02/2013  . Dyshidrotic eczema 11/17/2013  . Diastasis recti 04/08/2013  .  Facial nerve paresis 07/31/2012  . Blepharospasm 05/08/2012  . 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 Obesity 03/28/2012  . Hemoptysis 12/22/2011  . Neuropathic postherpetic trigeminal neuralgia 12/12/2011  . Allergic rhinitis, mild 10/12/2011  . Disequilibrium syndrome 09/27/2011  . Chronic bronchitis ([place]) 06/05/2011  . Essential hypertension 05/24/2011  . Type 2 diabetes mellitus without complications ([place]) 66/81/5947  . Pulmonary nodules 05/05/2011    Andrey Spearman, MS, OTR/L, [facility] 06/17/19 12:36 PM  [place] MAIN [facility] SERVICES [address] [place], [place], [address] Phone: [phone]   Fax:  [phone]  Name: Susan Callahan MRN: [id number] Date of Birth: 1950-12-27

## 2019-06-19 ENCOUNTER — Other Ambulatory Visit: Payer: Self-pay

## 2019-06-19 ENCOUNTER — Ambulatory Visit: Payer: Medicare Other | Admitting: Occupational Therapy

## 2019-06-19 DIAGNOSIS — M545 Low back pain: Secondary | ICD-10-CM | POA: Diagnosis not present

## 2019-06-19 DIAGNOSIS — I890 Lymphedema, not elsewhere classified: Secondary | ICD-10-CM

## 2019-06-20 NOTE — Therapy (Signed)
[facility] [address] [place], [place], [address] Phone: [phone]   Fax:  [phone]  Occupational Therapy Treatment, Progress Note and New POC  Patient Details  Name: Susan Callahan MRN: [id number] Date of Birth: 1951/03/29 Referring Provider (OT): Humberto Leep, MD   Encounter Date: 06/19/2019  OT End of Session - 06/19/19 1420    Visit Number  36    Number of Visits  36    Date for OT Re-Evaluation  09/18/19    OT Start Time  0100    OT Stop Time  0205    OT Time Calculation (min)  65 min    Activity Tolerance  Patient tolerated treatment well;No increased pain    Behavior During Therapy  WFL for tasks assessed/performed       Past Medical History:  Diagnosis Date  . COPD (chronic obstructive pulmonary disease) ([place])   . Depression 1974  . Diabetes mellitus without complication ([place])   . Hypertension   . Migraine   . 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 LUMBAR PERITONEAL SHUNT    . TYMPANOSTOMY TUBE PLACEMENT      There were no vitals filed for this visit.  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 R arm and hand swelling and axillary cording persist and continues to fluctuate. Custom compression garments have not yet been dellivered by DME vendor.    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 01/30/19 partial mastectomy. Margins clear. 0/2 LN  negative for  disease. actinic keritosis,DDD, DM, obesity, thyroid nodules, pulmonary nodules, chronic back pain, HTN, COPD, hx depression    Limitations  pain and swelling in R axilla and arm, chronic back pain, muscle weakness, impaired balance, decreased RUE AROM, abnormal gait, difficulty walking, impaired functional mobility and transfers,    Repetition  Increases Symptoms    Special Tests  -Stemmer at MPs bilaterally    Patient Stated Goals  relduce pain and improve movement  in my R arm so I can do things I need to do. reduce risk of lymphedema    Pain Onset  More than a month ago    Pain Onset  More than a month ago                           OT Education - 06/19/19 1419    Education Details  Continued skilled Pt/caregiver education  And LE ADL training throughout visit for lymphedema self care/ home program, including compression wrapping, compression garment and device wear/care, lymphatic pumping ther ex, simple self-MLD, and skin care. Discussed progress towards goals.    Person(s) Educated  Patient    Methods  Demonstration;Explanation;Tactile cues;Verbal cues;Handout    Comprehension  Verbalized understanding;Returned demonstration          OT Long Term Goals - 06/19/19 1400      OT LONG TERM GOAL #5   Status  Unable to assess   garments have not been delivered for fitting yet     OT LONG TERM GOAL #6   Baseline  Max A    Time  12    Period  Weeks    Status  Partially Met    Target Date  08/31/19            Plan - 06/19/19 1425    Clinical Impression Statement  Pt demonstrates progress towards all goals , despite ongoing fluctuating RUE/R hand swelling and 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 Pt tolerated MLD,      kinesio taping and compression wrapping today without difficulty, She will benefit continuing OT 2 x weekly for 12 weeks due to ongoing symptoms and delay for fitting custom compression garments  2/2 covid impact on international shipping and time needed to complete charitable funding application process.    OT Occupational Profile and History  Comprehensive Assessment- Review of records and extensive additional review of physical, cognitive, psychosocial history related to current functional performance    Occupational performance deficits  (Please refer to evaluation for details):  ADL's;IADL's;Social Participation;Leisure;Rest and Sleep;Work    Marketing executive / Function / Physical Skills  ADL;Decreased knowledge of precautions;ROM;UE functional use;Decreased knowledge of use of DME;Edema;Skin integrity;Pain;IADL    Rehab Potential  Good    Clinical Decision Making  Several treatment options, min-mod task modification necessary    Comorbidities Affecting Occupational Performance:  Presence of comorbidities impacting occupational performance    Comorbidities impacting occupational performance description:  see SUBJUECTIVE    Modification or Assistance to Complete Evaluation   Min-Moderate modification of tasks or assist with assess necessary to complete eval    OT Frequency  2x / week    OT Duration  12 weeks    OT Treatment/Interventions  Self-care/ADL training;Therapeutic exercise;Manual lymph drainage;Patient/family education;Therapeutic activities;DME and/or AE instruction;Manual Therapy;Other (comment)   consider fitting with prophylactic cmopression arm sleeve   Plan  BUE AROM ther ex to reduce axillary cording- 2 sets of 10 2-3 x daily    Consulted and Agree with Plan of Care  Patient       Patient will benefit from skilled therapeutic intervention in order to improve the following deficits and impairments:   Body Structure / Function / Physical Skills: ADL, Decreased knowledge of precautions, ROM, UE functional use, Decreased knowledge of use of DME, Edema, Skin integrity, Pain, IADL       Visit Diagnosis: Lymphedema, not elsewhere classified - Plan: Ot plan of care cert/re-cert    Problem List Patient Active Problem List   Diagnosis Date Noted  . Edema 04/16/2019  . Postoperative wound infection 03/13/2019  . Seroma of breast 03/11/2019  . Age-related osteoporosis without current pathological fracture 02/27/2019  . Malignant neoplasm of central portion of right breast in female, estrogen receptor positive  ([place]) 02/12/2019  . Genetic testing 01/31/2019  . Malignant neoplasm of upper-inner quadrant of right breast in female, estrogen receptor positive ([place]) 01/15/2019  . Long term current use of opiate analgesic 12/02/2018  . Tympanic membrane central perforation, left 11/04/2018  . DDD (degenerative disc disease), lumbosacral 10/10/2018  . Abnormal MRI, lumbar spine (08/29/2018) 10/10/2018  . 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 Lumbar nerve root impingement (Bilateral: L4 & L5) (L>R: L5) 10/10/2018  . Lumbar Spinal instability of L4-5 10/10/2018  . Lumbar facet hypertrophy 10/10/2018  . COPD (chronic obstructive pulmonary disease) with emphysema ([place]) 08/28/2018  . Elevated rheumatoid factor 08/13/2018  . Elevated C-reactive protein (CRP) 08/12/2018  . Elevated sed rate 08/12/2018  . Lumbar facet syndrome (Bilateral) 08/12/2018  . Back pain with left-sided radiculopathy 08/12/2018  . DDD (degenerative disc disease), lumbar 08/12/2018  . Chronic lower extremity pain (Primary Area of Pain) (Bilateral) (  L>R) 07/23/2018  . Chronic low back pain (Secondary Area of Pain) (Bilateral) (L>R) w/ sciatica (Bilateral) 07/23/2018  . Chronic pain syndrome 07/23/2018  . Pharmacologic therapy 07/23/2018  . Disorder of skeletal system 07/23/2018  . Problems influencing health status 07/23/2018  . Chronic sacroiliac joint pain [facility]) (Bilateral) (L>R) 07/23/2018  . Status post lumboperitoneal shunt placement 02/26/2018  . 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 Tympanic membrane perforation 05/30/2016  . PMB (postmenopausal bleeding) 04/26/2016  . Rash 12/27/2015  . 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 Plantar fasciitis of foot (Left) 05/26/2015  . Spondylosis of lumbar region without myelopathy or radiculopathy 05/26/2015  . Lumbar foraminal stenosis (L3-4, L4-5,  L5-S1) (L>R) 05/26/2015  . Spinal stenosis of lumbar region with neurogenic claudication 05/26/2015  . Encounter for screening mammogram for malignant neoplasm of breast 12/02/2014  . Goiter 12/02/2014  . Chronic idiopathic gout of foot (Right) 07/06/2014  . Biceps tendonitis (Right) 05/19/2014  . Shoulder pain (Right) 05/19/2014  . Facial weakness 05/05/2014  . Post herpetic neuralgia 05/05/2014  . Chronic dysfunction of left eustachian tube 03/24/2014  . Osteopenia 03/13/2014  . Lumbar Grade 1 Anterolisthesis of L3/4 (7m) & L4/5 (4-171m(w/ Dynamic instability) 03/13/2014  . Family history of colon cancer 12/02/2013  . Dyshidrotic eczema 11/17/2013  . Diastasis recti 04/08/2013  . Facial nerve paresis 07/31/2012  . Blepharospasm 05/08/2012  . 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 Obesity 03/28/2012  . Hemoptysis 12/22/2011  . Neuropathic postherpetic trigeminal neuralgia 12/12/2011  . Allergic rhinitis, mild 10/12/2011  . Disequilibrium syndrome 09/27/2011  . Chronic bronchitis ([place]09/24/2012  . Essential hypertension 05/24/2011  . Type 2 diabetes mellitus without complications ([place]0936/64/4034. Pulmonary nodules 05/05/2011    ThAndrey SpearmanMS, OTR/L, CLDay Surgery At River[facility]end0/09/20 12:16 PM  CoMarmarthAIN REDundy [facility] HospitalERVICES [address]field[place]hone: [phone] Fax:  [phone]: LiRenesme KerriganRN: [id number] of Birth: 02/11/29/1952

## 2019-06-23 ENCOUNTER — Ambulatory Visit: Payer: Medicare Other | Admitting: Occupational Therapy

## 2019-06-23 ENCOUNTER — Ambulatory Visit: Payer: Medicare Other

## 2019-06-25 ENCOUNTER — Ambulatory Visit: Payer: Medicare Other

## 2019-06-25 ENCOUNTER — Other Ambulatory Visit: Payer: Self-pay

## 2019-06-25 ENCOUNTER — Ambulatory Visit: Payer: Medicare Other | Admitting: Occupational Therapy

## 2019-06-25 DIAGNOSIS — M6281 Muscle weakness (generalized): Secondary | ICD-10-CM

## 2019-06-25 DIAGNOSIS — R2681 Unsteadiness on feet: Secondary | ICD-10-CM

## 2019-06-25 DIAGNOSIS — M545 Low back pain, unspecified: Secondary | ICD-10-CM

## 2019-06-25 DIAGNOSIS — I890 Lymphedema, not elsewhere classified: Secondary | ICD-10-CM

## 2019-06-25 DIAGNOSIS — G8929 Other chronic pain: Secondary | ICD-10-CM

## 2019-06-25 DIAGNOSIS — R2689 Other abnormalities of gait and mobility: Secondary | ICD-10-CM

## 2019-06-25 NOTE — Therapy (Signed)
[facility] SERVICES [address] [place], [place], [address] Phone: [phone]   Fax:  [phone]  Physical Therapy Treatment  Patient Details  Name: Susan Callahan MRN: [id number] Date of Birth: 07/12/1951 Referring Provider (PT): Francesco Sor   Encounter Date: 06/25/2019  PT End of Session - 06/25/19 1655    Visit Number  32    Number of Visits  33    Date for PT Re-Evaluation  07/10/19    Authorization Type  1/10 Pn 10/6    PT Start Time  1517    PT Stop Time  1600    PT Time Calculation (min)  43 min    Equipment Utilized During Treatment  Gait belt    Activity Tolerance  Patient tolerated treatment well;Patient limited by fatigue    Behavior During Therapy  WFL for tasks assessed/performed       Past Medical History:  Diagnosis Date  . COPD (chronic obstructive pulmonary disease) ([place])   . Depression 1974  . Diabetes mellitus without complication ([place])   . Hypertension   . Migraine   . 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 LUMBAR PERITONEAL SHUNT    . TYMPANOSTOMY TUBE PLACEMENT      There were no vitals filed for this visit.  Subjective Assessment - 06/25/19 1654    Subjective  Patient reports more stiffness in back. Has been compliant with HEP. L ring finger infected from fingernail splitting. Going to doctor Friday for it and started antibiotics. Is also getting a diabetic foot exam. Reports having some altered sensation of L foot. Patient missed last session due to stomach virus.    Pertinent History  Patient reports balance issues began in 2008. 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  Balance deficit has been constant since 2008, some days are worse than others but is always present to patient. Does not notice a directionality of loss of balance. Patient reports her balance is affecting her standing, walking  on uneven surfaces such as outside, trying to walk with dim light, showering and ADLs. Slight visual deficit of R lower quadrant. 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 Has nystagmus when looking to the R.  Back pain began in 2007 and has gradually worsened to nonstop pain. Medication helps briefly. Patient is challenged with standing with knees extended due to back pain.    Limitations  Lifting;Standing;Walking;House hold activities    How long can you stand comfortably?  pain begins immediately     How long can you walk comfortably?  pain begins immediately     Patient Stated Goals  walk better, have less pain, be able to walk in the grocery store with head turns to scan shelves    Currently in Pain?  Yes    Pain Score  3     Pain Location  Back    Pain Orientation  Lower    Pain Descriptors / Indicators  Aching    Pain Type  Chronic pain    Pain Onset  More than a month ago    Pain Frequency  Constant      L ring finger infected from fingernail splitting. Going to doctor Friday for it and started antibiotics. Is also getting a diabetic foot exam. Reports having some altered sensation of L foot. Patient missed last session due to stomach virus.    Treatment:  Prone:  Roller to thoracolumbar paraspinals, piriformis, hamstrings, and calves x 8  minutes   Treadmill 2.0 mph, alternating between 1-2% incline for interval training 7 minutes. Cueing for abdominal activation and sequencing of muscle activation. Able to maintain heel strike and neutral spinal alignment with BUE support.    Hooklying supine: GTB abduction with posterior pelvic tilt 15x Hamstring stretch 60 seconds each LE Nerve glides 20x each side GTB marching with posterior pelvic tilts 12x each LE.   Seated: Hamstring stretch on side of treadmill 60 seconds each LE Review of HEP: VORx1   Standing:  Slow pace marching with TrA activation SUE support     Pt educated throughout session about proper posture and technique  with exercises. Improved exercise technique, movement at target joints, use of target muscles after min to mod verbal, visual, tactile cues  Patient's condition has the potential to improve in response to therapy. Maximum improvement is yet to be obtained. The anticipated improvement is attainable and reasonable in a generally predictable time.  Patient reports that she is improving in quality of life and mobility with decreased pain.                       PT Education - 06/25/19 1655    Education Details  exercise technique, body mechanics    Person(s) Educated  Patient    Methods  Explanation;Demonstration;Tactile cues;Verbal cues    Comprehension  Verbalized understanding;Returned demonstration;Verbal cues required;Tactile cues required       PT Short Term Goals - 06/12/19 1514      PT SHORT TERM GOAL #1   Title  Patient will be independent with completion of HEP to improve ability to complete functional tasks and functional ADLs.    Baseline  10/23: will give HEP next session 11/19: HEP compliance    Time  2    Period  Weeks    Status  Achieved      PT SHORT TERM GOAL #2   Title  Patient will report pain score less than 4/10 on VAS to demonstrate reduction of pain levels with functional tasks.     Baseline  10/23: 6/10 11/19: 5/10 12/12: 9/10 1/10: 7/10  2/11: 3/10     Time  2    Period  Weeks    Status  Achieved        PT Long Term Goals - 06/12/19 1514      PT LONG TERM GOAL #1   Title  Patient will report pain score of <3/10 on VAS for decreased pain levels and ease with functional activities.    Baseline  10/23: 6/10 11/19: 5/10 12/12 9/10 1/10: 7/10 2/11: 3/10 3/9: 6/10  5/26: 8/10 03/17/19: 7/10 8/3: 5/10 8/25: 4/10 9/21: 4/10    Time  4    Period  Weeks    Status  Partially Met    Target Date  07/10/19      PT LONG TERM GOAL #2   Title  Patient will be able to ambulate on treadmill for 4mnutes without increase in back/leg pain demonstrating  improved community ambulation and treadmill walking at pulmonary rehab    Baseline  10/23: 042mutes increases pain 11/19: [id number] 12/12: has not had the chance to perform 1/10: was able to do 5 minutes without pain 2/11: able to do 5 minutes then had to bend over 3/9: 5 minutes 30 seconds at 2.0 mphs 5/26: had to walk 10 minutes at cancer center with extreme  pain but was able to perform 7/6: walks 10 minutes with increase of pain by 2 points 8/3: able to perform with bent over posture 9/21: able to perform 7 minutes with intervals of 1-2% incline    Time  4    Period  Weeks    Status  Achieved      PT LONG TERM GOAL #3   Title  Patient will increase dynamic gait index score to >19/24 as to demonstrate reduced fall risk and improved dynamic gait balance for better safety with community/home ambulation.    Baseline  10/1: 11/24    Time  4    Period  Weeks    Status  New    Target Date  07/10/19      PT LONG TERM GOAL #4   Title  Patient will score 4+/5 on BLE MMT to demonstrate improved glute strength and ability to carry groceries with increased ease up/down stairs at home.    Baseline  10/23: 4-/5 grossly 11/19: 4/5 grossly; hamstrings 4-/5 12/12: 4/5 gross 1/10: 4/5 gross 2/11: gross 4+/5 with hamstrings and abuductors 4-/5 3/9: gross 4+/5 hamstrings and abductors 4-/5 5/27: grossly 3+/5 with pain with LLE movements 7/6: grossly 4-/5 with pain with LLE movement 8/3: deferred due to swelling/pain 8/25: grossly 4/5 without pain 9/21: grossly 4/5 with hamstrings not tested due to spasm    Time  4    Period  Weeks    Status  Partially Met    Target Date  07/10/19      PT LONG TERM GOAL #5   Title  Patient will increase six minute walk test distance to >1000 with minimal increase of pain for progression to community ambulator and improve gait ability    Baseline  10/1:580 ft stopped at 3: 58 due to excessive pain    Time  4    Period  Weeks    Status  New    Target Date  07/10/19      PT  LONG TERM GOAL #6   Title  Patient will be able to make and/or strip the bed without pain and without needing rest breaks to perform iADLs independently.     Baseline  2/11: pain limits ability to perform, ~4 breaks required 3/9: takes her time, 3 breaks 5/27: very painful still 7/6: requires only one rest break 8/3: able to perform with +2 pain increase 8/25: no rest breaks required, increased pain 4 points 9/21: able to perform with +1-2 symptoms    Time  4    Period  Weeks    Status  Partially Met    Target Date  07/10/19      PT LONG TERM GOAL #7   Title  Patient will have decreased modified oswestry score <20% for improved ability to complete household tasks with less pain levels.     Baseline  2/11: 30%  3/9: 30% 5/27: 63% 7/6: 38% 8/3: 42% 8/25: 36% 9/21: 30%    Time  4    Period  Weeks    Status  Partially Met    Target Date  07/10/19      PT LONG TERM GOAL #8   Title  Patient will increase Berg Balance score by > 6 points (50/56) to demonstrate decreased fall risk during functional activities.    Baseline  10/1: 44/56    Time  4    Period  Weeks    Status  New    Target Date  07/10/19  Plan - 06/25/19 1658    Clinical Impression Statement  Patient presents with increased stiffness of back and LE's requiring additional focus on back rather than balance this session. By end of session patient reports reduction of symptoms and improved gait mechanics. Slow single leg stability interventions such as marching is challenging to patient at this time inducing radiating symptoms.  Patient will benefit from skilled physical therapy to increase balance, improve strength, decrease pain, and improve functional capacity of mobility for improved quality of life.    Personal Factors and Comorbidities  Age;Comorbidity 3+;Fitness;Finances;Past/Current Experience;Social Background;Time since onset of injury/illness/exacerbation    Comorbidities  COPD, Ramsay Hunt Syndrome, HTN, DM,  lymphedema, breast cancer    Examination-Activity Limitations  Bathing;Bend;Carry;Dressing;Hygiene/Grooming;Stairs;Squat;Reach Overhead;Locomotion Level;Lift;Stand;Transfers    Examination-Participation Restrictions  Cleaning;Community Activity;Laundry;Volunteer;Shop;Personal Finances;Yard Work    [employer]  Evolving/Moderate complexity    Rehab Potential  Fair    PT Frequency  2x / week    PT Duration  4 weeks    PT Treatment/Interventions  ADLs/Self Care Home Management;Cryotherapy;Moist Heat;Traction;Iontophoresis 76m/ml Dexamethasone;Ultrasound;Gait training;Stair training;Functional mobility training;Neuromuscular re-education;Balance training;Therapeutic exercise;Therapeutic activities;Patient/family education;Manual techniques;Energy conservation;Aquatic Therapy;Electrical Stimulation;Passive range of motion;Dry needling;Taping;Vestibular;Canalith Repostioning    PT Next Visit Plan  VOR, walking with head turns    PT Home Exercise Plan  VOR    Consulted and Agree with Plan of Care  Patient       Patient will benefit from skilled therapeutic intervention in order to improve the following deficits and impairments:  Abnormal gait, Cardiopulmonary status limiting activity, Decreased activity tolerance, Decreased balance, Decreased endurance, Decreased range of motion, Difficulty walking, Decreased strength, Hypomobility, Decreased mobility, Impaired perceived functional ability, Impaired flexibility, Postural dysfunction, Obesity, Improper body mechanics, Pain, Dizziness, Increased edema  Visit Diagnosis: Chronic low back pain, unspecified back pain laterality, unspecified whether sciatica present  Muscle weakness (generalized)  Other abnormalities of gait and mobility  Unsteadiness on feet     Problem List Patient Active Problem List   Diagnosis Date Noted  . Edema 04/16/2019  . Postoperative wound infection 03/13/2019  . Seroma of breast 03/11/2019  .  Age-related osteoporosis without current pathological fracture 02/27/2019  . Malignant neoplasm of central portion of right breast in female, estrogen receptor positive ([place] 02/12/2019  . Genetic testing 01/31/2019  . Malignant neoplasm of upper-inner quadrant of right breast in female, estrogen receptor positive ([facility] 01/15/2019  . Long term current use of opiate analgesic 12/02/2018  . Tympanic membrane central perforation, left 11/04/2018  . DDD (degenerative disc disease), lumbosacral 10/10/2018  . Abnormal MRI, lumbar spine (08/29/2018) 10/10/2018  . 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 Lumbar nerve root impingement (Bilateral: L4 & L5) (L>R: L5) 10/10/2018  . Lumbar Spinal instability of L4-5 10/10/2018  . Lumbar facet hypertrophy 10/10/2018  . COPD (chronic obstructive pulmonary disease) with emphysema ([place] 08/28/2018  . Elevated rheumatoid factor 08/13/2018  . Elevated C-reactive protein (CRP) 08/12/2018  . Elevated sed rate 08/12/2018  . Lumbar facet syndrome (Bilateral) 08/12/2018  . Back pain with left-sided radiculopathy 08/12/2018  . DDD (degenerative disc disease), lumbar 08/12/2018  . Chronic lower extremity pain (Primary Area of Pain) (Bilateral) (L>R) 07/23/2018  . Chronic low back pain (Secondary Area of Pain) (Bilateral) (L>R) w/ sciatica (Bilateral) 07/23/2018  . Chronic pain syndrome 07/23/2018  . Pharmacologic therapy 07/23/2018  . Disorder of skeletal system 07/23/2018  . Problems influencing health status 07/23/2018  . Chronic sacroiliac joint pain (Tertiary Area of Pain) (Bilateral) (L>R)  07/23/2018  . Status post lumboperitoneal shunt placement 02/26/2018  . 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 Tympanic membrane perforation 05/30/2016  . PMB (postmenopausal bleeding) 04/26/2016  . Rash 12/27/2015  . 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 Plantar fasciitis of foot (Left) 05/26/2015  . Spondylosis of lumbar region without myelopathy or radiculopathy 05/26/2015  . Lumbar foraminal stenosis (L3-4, L4-5, L5-S1) (L>R) 05/26/2015  . Spinal stenosis of lumbar region with neurogenic claudication 05/26/2015  . Encounter for screening mammogram for malignant neoplasm of breast 12/02/2014  . Goiter 12/02/2014  . Chronic idiopathic gout of foot (Right) 07/06/2014  . Biceps tendonitis (Right) 05/19/2014  . Shoulder pain (Right) 05/19/2014  . Facial weakness 05/05/2014  . Post herpetic neuralgia 05/05/2014  . Chronic dysfunction of left eustachian tube 03/24/2014  . Osteopenia 03/13/2014  . Lumbar Grade 1 Anterolisthesis of L3/4 (4m) & L4/5 (4-137m(w/ Dynamic instability) 03/13/2014  . Family history of colon cancer 12/02/2013  . Dyshidrotic eczema 11/17/2013  . Diastasis recti 04/08/2013  . Facial nerve paresis 07/31/2012  . Blepharospasm 05/08/2012  . 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 Obesity 03/28/2012  . Hemoptysis 12/22/2011  . Neuropathic postherpetic trigeminal neuralgia 12/12/2011  . Allergic rhinitis, mild 10/12/2011  . Disequilibrium syndrome 09/27/2011  . Chronic bronchitis ([facility] [place]09/24/2012  . Essential hypertension 05/24/2011  . Type 2 diabetes mellitus without complications ([id number]. Pulmonary nodules 05/05/2011   MaJanna ArchPT, DPT   06/25/2019, 4:59 PM  CoWoodacreAIN RELegacy [facility] CenterERVICES [id number]: [phone] Fax:  [phone]: Susan MimbsRN: [id number] of Birth: [birth year]

## 2019-06-25 NOTE — Therapy (Signed)
[place] MAIN [facility] SERVICES [address] [place], [place], [address] Phone: [phone]   Fax:  [phone]  Occupational Therapy Treatment  Patient Details  Name: Susan Callahan MRN: [id number] Date of Birth: [birth year] Referring Provider (OT): Humberto Leep, MD   Encounter Date: 06/25/2019  OT End of Session - 06/25/19 4967    Visit Number  1    Number of Visits  36    Date for OT Re-Evaluation  09/18/19    Authorization Type  new POC requesting 36 additional visits 06/18/19    OT Start Time  0200    OT Stop Time  0315    OT Time Calculation (min)  75 min    Activity Tolerance  Patient tolerated treatment well;No increased pain    Behavior During Therapy  WFL for tasks assessed/performed       Past Medical History:  Diagnosis Date  . COPD (chronic obstructive pulmonary disease) ([place])   . Depression 1974  . Diabetes mellitus without complication ([place])   . Hypertension   . Migraine   . 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 LUMBAR PERITONEAL SHUNT    . TYMPANOSTOMY TUBE PLACEMENT      There were no vitals filed for this visit.  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 R arm and hand swelling and axillary cording. Pt brnigs new custom compression arm sleeve and glove, both ccl 2, to clinic for fitting.    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 01/30/19 partial mastectomy. Margins clear. 0/2 LN  negative for  disease. actinic keritosis,DDD, DM, obesity, thyroid nodules, pulmonary nodules, chronic back pain, HTN, COPD, hx depression    Limitations  pain and swelling in R axilla and arm, chronic back pain, muscle weakness, impaired balance, decreased RUE AROM, abnormal gait, difficulty walking, impaired  functional mobility and transfers,    Repetition  Increases Symptoms    Special Tests  -Stemmer at MPs bilaterally    Patient Stated Goals  relduce pain and improve movement  in my R arm so I can do things I need to do. reduce risk of lymphedema    Pain Onset  More than a month ago    Pain Onset  More than a month ago                   OT Treatments/Exercises (OP) - 06/25/19 0001      ADLs   ADL Education Given  Yes      Manual Therapy   Manual Therapy  Edema management;Manual Lymphatic Drainage (MLD)    Manual therapy comments  fitted custom arm sleeve and glove    Edema Management  skin care w/ low ph castor oil throughout MLD to improcve tissue hyddration and to improve skin mobility.    Manual Lymphatic Drainage (MLD)  MLD to RUE/RUQ as established using cntalateral AAA    Compression Bandaging  RUE and hand wrapped using multilayer short stretch wraps over Rosidal foam and OTS cmpression glove. Kinesiotape ( 2" wide fan cuts on end  attached over metacarpals and anchored on wrist extensor muscle bellies to facilitate passive skin stretch to decongest stubborn hand swelling  OT Education - 06/25/19 1645    Education Details  Pt edu for donning and doffing compression armsleeve and custom glove using assistive device- OT fabricated "hand slippie". Good return    Person(s) Educated  Patient    Methods  Demonstration;Explanation;Tactile cues;Verbal cues;Handout    Comprehension  Verbalized understanding;Returned demonstration          OT Long Term Goals - 06/19/19 1400      OT LONG TERM GOAL #5   Status  Unable to assess   garments have not been delivered for fitting yet     OT LONG TERM GOAL #6   Baseline  Max A    Time  12    Period  Weeks    Status  Partially Met    Target Date  08/31/19            Plan - 06/25/19 1648    Clinical Impression Statement  Custom, flat knit, Elvarex SOFT , ccl 2 ( 25-35 mmHg)  compression armsleeve  and glove arrived today and fitting com,pleted in clinic. skilled training Pt able to don sleeve correctly positioning seam and elbow knitting mark at correct landmarks. Glove is comfortable, but appears to be slightly too large as evidenced by ease of donning and doffing. Finger stubs are also too long for all digits by ~ 1 cm. This garment needs to be remeasured and remade . Arm sleeve circumferential fit looks very good, but overall length appears slightly too long, as evidenced by slight bunching at elbow. Pt will wash and wear during interval and report any functional issues next visit before we order remakes. Pt tolerated MLD without difficulty. Hand swelling mildly more pronounced at beginning of tratment. No definitive change noted after MLD.    OT Occupational Profile and History  Comprehensive Assessment- Review of records and extensive additional review of physical, cognitive, psychosocial history related to current functional performance    Occupational performance deficits (Please refer to evaluation for details):  ADL's;IADL's;Social Participation;Leisure;Rest and Sleep;Work    Marketing executive / Function / Physical Skills  ADL;Decreased knowledge of precautions;ROM;UE functional use;Decreased knowledge of use of DME;Edema;Skin integrity;Pain;IADL    Rehab Potential  Good    Clinical Decision Making  Several treatment options, min-mod task modification necessary    Comorbidities Affecting Occupational Performance:  Presence of comorbidities impacting occupational performance    Comorbidities impacting occupational performance description:  see SUBJUECTIVE    Modification or Assistance to Complete Evaluation   Min-Moderate modification of tasks or assist with assess necessary to complete eval    OT Frequency  2x / week    OT Duration  12 weeks    OT Treatment/Interventions  Self-care/ADL training;Therapeutic exercise;Manual lymph drainage;Patient/family education;Therapeutic activities;DME  and/or AE instruction;Manual Therapy;Other (comment)   consider fitting with prophylactic cmopression arm sleeve   Plan  BUE AROM ther ex to reduce axillary cording- 2 sets of 10 2-3 x daily    Consulted and Agree with Plan of Care  Patient       Patient will benefit from skilled therapeutic intervention in order to improve the following deficits and impairments:   Body Structure / Function / Physical Skills: ADL, Decreased knowledge of precautions, ROM, UE functional use, Decreased knowledge of use of DME, Edema, Skin integrity, Pain, IADL       Visit Diagnosis: Lymphedema, not elsewhere classified    Problem List Patient Active Problem List   Diagnosis Date Noted  . Edema 04/16/2019  . Postoperative wound infection 03/13/2019  .  Seroma of breast 03/11/2019  . Age-related osteoporosis without current pathological fracture 02/27/2019  . Malignant neoplasm of central portion of right breast in female, estrogen receptor positive ([place]) 02/12/2019  . Genetic testing 01/31/2019  . Malignant neoplasm of upper-inner quadrant of right breast in female, estrogen receptor positive ([place]) 01/15/2019  . Long term current use of opiate analgesic 12/02/2018  . Tympanic membrane central perforation, left 11/04/2018  . DDD (degenerative disc disease), lumbosacral 10/10/2018  . Abnormal MRI, lumbar spine (08/29/2018) 10/10/2018  . 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 Lumbar nerve root impingement (Bilateral: L4 & L5) (L>R: L5) 10/10/2018  . Lumbar Spinal instability of L4-5 10/10/2018  . Lumbar facet hypertrophy 10/10/2018  . COPD (chronic obstructive pulmonary disease) with emphysema ([place]) 08/28/2018  . Elevated rheumatoid factor 08/13/2018  . Elevated C-reactive protein (CRP) 08/12/2018  . Elevated sed rate 08/12/2018  . Lumbar facet syndrome (Bilateral) 08/12/2018  . Back pain with left-sided radiculopathy 08/12/2018  . DDD (degenerative disc  disease), lumbar 08/12/2018  . Chronic lower extremity pain (Primary Area of Pain) (Bilateral) (L>R) 07/23/2018  . Chronic low back pain (Secondary Area of Pain) (Bilateral) (L>R) w/ sciatica (Bilateral) 07/23/2018  . Chronic pain syndrome 07/23/2018  . Pharmacologic therapy 07/23/2018  . Disorder of skeletal system 07/23/2018  . Problems influencing health status 07/23/2018  . Chronic sacroiliac joint pain [facility] Area of Pain) (Bilateral) (L>R) 07/23/2018  . Status post lumboperitoneal shunt placement 02/26/2018  . 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 Tympanic membrane perforation 05/30/2016  . PMB (postmenopausal bleeding) 04/26/2016  . Rash 12/27/2015  . 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 Plantar fasciitis of foot (Left) 05/26/2015  . Spondylosis of lumbar region without myelopathy or radiculopathy 05/26/2015  . Lumbar foraminal stenosis (L3-4, L4-5, L5-S1) (L>R) 05/26/2015  . Spinal stenosis of lumbar region with neurogenic claudication 05/26/2015  . Encounter for screening mammogram for malignant neoplasm of breast 12/02/2014  . Goiter 12/02/2014  . Chronic idiopathic gout of foot (Right) 07/06/2014  . Biceps tendonitis (Right) 05/19/2014  . Shoulder pain (Right) 05/19/2014  . Facial weakness 05/05/2014  . Post herpetic neuralgia 05/05/2014  . Chronic dysfunction of left eustachian tube 03/24/2014  . Osteopenia 03/13/2014  . Lumbar Grade 1 Anterolisthesis of L3/4 (29m) & L4/5 (4-165m(w/ Dynamic instability) 03/13/2014  . Family history of colon cancer 12/02/2013  . Dyshidrotic eczema 11/17/2013  . Diastasis recti 04/08/2013  . Facial nerve paresis 07/31/2012  . Blepharospasm 05/08/2012  . 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 Obesity 03/28/2012  . Hemoptysis 12/22/2011  . Neuropathic postherpetic trigeminal neuralgia 12/12/2011  . Allergic  rhinitis, mild 10/12/2011  . Disequilibrium syndrome 09/27/2011  . Chronic bronchitis ([place]09/24/2012  . Essential hypertension 05/24/2011  . Type 2 diabetes mellitus without complications (HC[facility] [id number]/23/3435. Pulmonary nodules 05/05/2011    ThAndrey SpearmanMS, OTR/L, CLAnmed Health Rehabilitation Hospital0/14/20 4:54 PM  CoIndio HillsAIN REAdventist Healthcare Shady Grove Medical CenterERVICES [address]hone: [phone] Fax:  [phone]: Susan BrundageRN: [id number] of Birth: [birth year]

## 2019-06-30 ENCOUNTER — Ambulatory Visit: Payer: Medicare Other

## 2019-06-30 ENCOUNTER — Other Ambulatory Visit: Payer: Self-pay

## 2019-06-30 ENCOUNTER — Ambulatory Visit: Payer: Medicare Other | Admitting: Occupational Therapy

## 2019-06-30 DIAGNOSIS — M545 Low back pain, unspecified: Secondary | ICD-10-CM

## 2019-06-30 DIAGNOSIS — M6281 Muscle weakness (generalized): Secondary | ICD-10-CM

## 2019-06-30 DIAGNOSIS — R2681 Unsteadiness on feet: Secondary | ICD-10-CM

## 2019-06-30 DIAGNOSIS — I972 Postmastectomy lymphedema syndrome: Secondary | ICD-10-CM

## 2019-06-30 DIAGNOSIS — G8929 Other chronic pain: Secondary | ICD-10-CM

## 2019-06-30 DIAGNOSIS — R2689 Other abnormalities of gait and mobility: Secondary | ICD-10-CM

## 2019-06-30 NOTE — Therapy (Signed)
[place] MAIN [facility] SERVICES [address] [place], [place], [address] Phone: [phone]   Fax:  [phone]  Physical Therapy Treatment  Patient Details  Name: Susan Callahan MRN: [id number] Date of Birth: 03-Aug-1951 Referring Provider (PT): Francesco Sor   Encounter Date: 06/30/2019  PT End of Session - 06/30/19 1221    Visit Number  86    Number of Visits  66    Date for PT Re-Evaluation  07/10/19    Authorization Type  2/10 Pn 10/6    PT Start Time  1115    PT Stop Time  1159    PT Time Calculation (min)  44 min    Equipment Utilized During Treatment  Gait belt    Activity Tolerance  Patient tolerated treatment well;Patient limited by fatigue    Behavior During Therapy  [facility] for tasks assessed/performed       Past Medical History:  Diagnosis Date  . COPD (chronic obstructive pulmonary disease) ([place])   . Depression 1974  . Diabetes mellitus without complication ([place])   . Hypertension   . Migraine   . 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 LUMBAR PERITONEAL SHUNT    . TYMPANOSTOMY TUBE PLACEMENT      There were no vitals filed for this visit.  Subjective Assessment - 06/30/19 1149    Subjective  Patient reports her doctor visit went well on Friday. Has a small infection of L finger that is now under control. No/low back pain today, was able to garden over the weekend.    Pertinent History  Patient reports balance issues began in 2008. 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  Balance deficit has been constant since 2008, some days are worse than others but is always present to patient. Does not notice a directionality of loss of balance. Patient reports her balance is affecting her standing, walking on uneven surfaces such as outside, trying to walk with dim light, showering and ADLs. Slight visual deficit of R lower quadrant. Left  eye is affected by Virl Axe. Has nystagmus when looking to the R.  Back pain began in 2007 and has gradually worsened to nonstop pain. Medication helps briefly. Patient is challenged with standing with knees extended due to back pain.    Limitations  Lifting;Standing;Walking;House hold activities    How long can you stand comfortably?  pain begins immediately     How long can you walk comfortably?  pain begins immediately     Patient Stated Goals  walk better, have less pain, be able to walk in the grocery store with head turns to scan shelves    Currently in Pain?  No/denies           Treatment:   Prone:  Roller to thoracolumbar paraspinals, piriformis, hamstrings, and calves x 8  minutes   Seated: Hamstring stretch on side of treadmill 60 seconds each LE  prostretch df/pf rocking 60 seconds; x2 trials  3lb ankle weight LAQ 10x each LE 3 second hold 3lb: IR/ER 12 x each LE 3lb: marching with posterior pelvic tilt and TrA activation 12x each LE  Standing:  3lb ankle weights    Slow pace marching with TrA activation SUE support, 12x each LE  Hip extension 12x each LE     Treadmill 2.0 mph, alternating between 1-2% incline for interval training 7 minutes. Cueing for abdominal activation and  sequencing of muscle activation. Able to maintain heel strike and neutral spinal alignment with BUE support.     Pt educated throughout session about proper posture and technique with exercises. Improved exercise technique, movement at target joints, use of target muscles after min to mod verbal, visual, tactile cues                        PT Education - 06/30/19 1220    Education Details  exercise technique, body mechanics    Person(s) Educated  Patient    Methods  Explanation;Demonstration;Tactile cues;Verbal cues    Comprehension  Verbalized understanding;Returned demonstration;Verbal cues required;Tactile cues required       PT Short Term Goals - 06/12/19 1514       PT SHORT TERM GOAL #1   Title  Patient will be independent with completion of HEP to improve ability to complete functional tasks and functional ADLs.    Baseline  10/23: will give HEP next session 11/19: HEP compliance    Time  2    Period  Weeks    Status  Achieved      PT SHORT TERM GOAL #2   Title  Patient will report pain score less than 4/10 on VAS to demonstrate reduction of pain levels with functional tasks.     Baseline  10/23: 6/10 11/19: 5/10 12/12: 9/10 1/10: 7/10  2/11: 3/10     Time  2    Period  Weeks    Status  Achieved        PT Long Term Goals - 06/12/19 1514      PT LONG TERM GOAL #1   Title  Patient will report pain score of <3/10 on VAS for decreased pain levels and ease with functional activities.    Baseline  10/23: 6/10 11/19: 5/10 12/12 9/10 1/10: 7/10 2/11: 3/10 3/9: 6/10  5/26: 8/10 03/17/19: 7/10 8/3: 5/10 8/25: 4/10 9/21: 4/10    Time  4    Period  Weeks    Status  Partially Met    Target Date  07/10/19      PT LONG TERM GOAL #2   Title  Patient will be able to ambulate on treadmill for 212mnutes without increase in back/leg pain demonstrating improved community ambulation and treadmill walking at pulmonary rehab    Baseline  10/23: 065mutes increases pain 11/19: 12m20mtes 12/12: has not had the chance to perform 1/10: was able to do 5 minutes without pain 2/11: able to do 5 minutes then had to bend over 3/9: 5 minutes 30 seconds at 2.0 mphs 5/26: had to walk 10 minutes at cancer center with extreme pain but was able to perform 7/6: walks 10 minutes with increase of pain by 2 points 8/3: able to perform with bent over posture 9/21: able to perform 7 minutes with intervals of 1-2% incline    Time  4    Period  Weeks    Status  Achieved      PT LONG TERM GOAL #3   Title  Patient will increase dynamic gait index score to >19/24 as to demonstrate reduced fall risk and improved dynamic gait balance for better safety with community/home ambulation.     Baseline  10/1: 11/24    Time  4    Period  Weeks    Status  New    Target Date  07/10/19      PT LONG TERM GOAL #4   Title  Patient will score 4+/5 on BLE MMT to demonstrate improved glute strength and ability to carry groceries with increased ease up/down stairs at home.    Baseline  10/23: 4-/5 grossly 11/19: 4/5 grossly; hamstrings 4-/5 12/12: 4/5 gross 1/10: 4/5 gross 2/11: gross 4+/5 with hamstrings and abuductors 4-/5 3/9: gross 4+/5 hamstrings and abductors 4-/5 5/27: grossly 3+/5 with pain with LLE movements 7/6: grossly 4-/5 with pain with LLE movement 8/3: deferred due to swelling/pain 8/25: grossly 4/5 without pain 9/21: grossly 4/5 with hamstrings not tested due to spasm    Time  4    Period  Weeks    Status  Partially Met    Target Date  07/10/19      PT LONG TERM GOAL #5   Title  Patient will increase six minute walk test distance to >1000 with minimal increase of pain for progression to community ambulator and improve gait ability    Baseline  10/1:580 ft stopped at 3: 58 due to excessive pain    Time  4    Period  Weeks    Status  New    Target Date  07/10/19      PT LONG TERM GOAL #6   Title  Patient will be able to make and/or strip the bed without pain and without needing rest breaks to perform iADLs independently.     Baseline  2/11: pain limits ability to perform, ~4 breaks required 3/9: takes her time, 3 breaks 5/27: very painful still 7/6: requires only one rest break 8/3: able to perform with +2 pain increase 8/25: no rest breaks required, increased pain 4 points 9/21: able to perform with +1-2 symptoms    Time  4    Period  Weeks    Status  Partially Met    Target Date  07/10/19      PT LONG TERM GOAL #7   Title  Patient will have decreased modified oswestry score <20% for improved ability to complete household tasks with less pain levels.     Baseline  2/11: 30%  3/9: 30% 5/27: 63% 7/6: 38% 8/3: 42% 8/25: 36% 9/21: 30%    Time  4    Period  Weeks     Status  Partially Met    Target Date  07/10/19      PT LONG TERM GOAL #8   Title  Patient will increase Berg Balance score by > 6 points (50/56) to demonstrate decreased fall risk during functional activities.    Baseline  10/1: 44/56    Time  4    Period  Weeks    Status  New    Target Date  07/10/19            Plan - 06/30/19 1222    Clinical Impression Statement  Patient presents with good levels of back pain allowing for progression of lower extremity strength and stability. Patient has slight hamstring tightening during posterior chain activation that was relieved with roller. Patient will benefit from skilled physical therapy to increase balance, improve strength, decrease pain, and improve functional capacity of mobility for improved quality of life.    Personal Factors and Comorbidities  Age;Comorbidity 3+;Fitness;Finances;Past/Current Experience;Social Background;Time since onset of injury/illness/exacerbation    Comorbidities  COPD, Ramsay Hunt Syndrome, HTN, DM, lymphedema, breast cancer    Examination-Activity Limitations  Bathing;Bend;Carry;Dressing;Hygiene/Grooming;Stairs;Squat;Reach Overhead;Locomotion Level;Lift;Stand;Transfers    Examination-Participation Restrictions  Cleaning;Community Activity;Laundry;Volunteer;Shop;Personal Finances;Yard Work    Nurse, learning disability  Potential  Fair    PT Frequency  2x / week    PT Duration  4 weeks    PT Treatment/Interventions  ADLs/Self Care Home Management;Cryotherapy;Moist Heat;Traction;Iontophoresis 74m/ml Dexamethasone;Ultrasound;Gait training;Stair training;Functional mobility training;Neuromuscular re-education;Balance training;Therapeutic exercise;Therapeutic activities;Patient/family education;Manual techniques;Energy conservation;Aquatic Therapy;Electrical Stimulation;Passive range of motion;Dry needling;Taping;Vestibular;Canalith Repostioning    PT Next Visit Plan  VOR,  walking with head turns    PT Home Exercise Plan  VOR    Consulted and Agree with Plan of Care  Patient       Patient will benefit from skilled therapeutic intervention in order to improve the following deficits and impairments:  Abnormal gait, Cardiopulmonary status limiting activity, Decreased activity tolerance, Decreased balance, Decreased endurance, Decreased range of motion, Difficulty walking, Decreased strength, Hypomobility, Decreased mobility, Impaired perceived functional ability, Impaired flexibility, Postural dysfunction, Obesity, Improper body mechanics, Pain, Dizziness, Increased edema  Visit Diagnosis: Chronic low back pain, unspecified back pain laterality, unspecified whether sciatica present  Muscle weakness (generalized)  Other abnormalities of gait and mobility  Unsteadiness on feet     Problem List Patient Active Problem List   Diagnosis Date Noted  . Edema 04/16/2019  . Postoperative wound infection 03/13/2019  . Seroma of breast 03/11/2019  . Age-related osteoporosis without current pathological fracture 02/27/2019  . Malignant neoplasm of central portion of right breast in female, estrogen receptor positive ([place] 02/12/2019  . Genetic testing 01/31/2019  . Malignant neoplasm of upper-inner quadrant of right breast in female, estrogen receptor positive ([place] 01/15/2019  . Long term current use of opiate analgesic 12/02/2018  . Tympanic membrane central perforation, left 11/04/2018  . DDD (degenerative disc disease), lumbosacral 10/10/2018  . Abnormal MRI, lumbar spine (08/29/2018) 10/10/2018  . 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 Lumbar nerve root impingement (Bilateral: L4 & L5) (L>R: L5) 10/10/2018  . Lumbar Spinal instability of L4-5 10/10/2018  . Lumbar facet hypertrophy 10/10/2018  . COPD (chronic obstructive pulmonary disease) with emphysema ([place] 08/28/2018  . Elevated rheumatoid factor 08/13/2018   . Elevated C-reactive protein (CRP) 08/12/2018  . Elevated sed rate 08/12/2018  . Lumbar facet syndrome (Bilateral) 08/12/2018  . Back pain with left-sided radiculopathy 08/12/2018  . DDD (degenerative disc disease), lumbar 08/12/2018  . Chronic lower extremity pain (Primary Area of Pain) (Bilateral) (L>R) 07/23/2018  . Chronic low back pain (Secondary Area of Pain) (Bilateral) (L>R) w/ sciatica (Bilateral) 07/23/2018  . Chronic pain syndrome 07/23/2018  . Pharmacologic therapy 07/23/2018  . Disorder of skeletal system 07/23/2018  . Problems influencing health status 07/23/2018  . Chronic sacroiliac joint pain [facility]) (Bilateral) (L>R) 07/23/2018  . Status post lumboperitoneal shunt placement 02/26/2018  . 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 Tympanic membrane perforation 05/30/2016  . PMB (postmenopausal bleeding) 04/26/2016  . Rash 12/27/2015  . 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 Plantar fasciitis of foot (Left) 05/26/2015  . Spondylosis of lumbar region without myelopathy or radiculopathy 05/26/2015  . Lumbar foraminal stenosis (L3-4, L4-5, L5-S1) (L>R) 05/26/2015  . Spinal stenosis of lumbar region with neurogenic claudication 05/26/2015  . Encounter for screening mammogram for malignant neoplasm of breast 12/02/2014  . Goiter 12/02/2014  . Chronic idiopathic gout of foot (Right) 07/06/2014  . Biceps tendonitis (Right) 05/19/2014  . Shoulder pain (Right) 05/19/2014  . Facial weakness 05/05/2014  . Post herpetic neuralgia 05/05/2014  . Chronic dysfunction of left eustachian tube 03/24/2014  . Osteopenia 03/13/2014  .  Lumbar Grade 1 Anterolisthesis of L3/4 (77m) & L4/5 (4-127m(w/ Dynamic instability) 03/13/2014  . Family history of colon cancer 12/02/2013  . Dyshidrotic eczema 11/17/2013  . Diastasis recti 04/08/2013  . Facial nerve paresis 07/31/2012  . Blepharospasm 05/08/2012  .  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 Obesity 03/28/2012  . Hemoptysis 12/22/2011  . Neuropathic postherpetic trigeminal neuralgia 12/12/2011  . Allergic rhinitis, mild 10/12/2011  . Disequilibrium syndrome 09/27/2011  . Chronic bronchitis ([facility]09/24/2012  . Essential hypertension 05/24/2011  . Type 2 diabetes mellitus without complications ([facility]0932/76/1470. Pulmonary nodules 05/05/2011   MaJanna ArchPT, DPT   06/30/2019, 12:24 PM  [place] RET[facility] CenterERVICES [address]dSedaliaNC[place]hone: [phone] Fax:  [phone]: Susan MccroskeyRN: [id number] of Birth: 02/1951/02/04

## 2019-06-30 NOTE — Therapy (Signed)
[place] MAIN [facility] SERVICES [address] [place], [place], [address] Phone: [phone]   Fax:  [phone]  Occupational Therapy Treatment  Patient Details  Name: Susan Callahan MRN: [id number] Date of Birth: [birth year] Referring Provider (OT): Humberto Leep, MD   Encounter Date: 06/30/2019  OT End of Session - 06/30/19 1018    Visit Number  2    Number of Visits  36    Date for OT Re-Evaluation  09/18/19    Authorization Type  new POC requesting 36 additional visits 06/18/19    Activity Tolerance  Patient tolerated treatment well;No increased pain    Behavior During Therapy  WFL for tasks assessed/performed       Past Medical History:  Diagnosis Date  . COPD (chronic obstructive pulmonary disease) ([place])   . Depression 1974  . Diabetes mellitus without complication ([place])   . Hypertension   . Migraine   . 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 LUMBAR PERITONEAL SHUNT    . TYMPANOSTOMY TUBE PLACEMENT      There were no vitals filed for this visit.                             OT Long Term Goals - 06/19/19 1400      OT LONG TERM GOAL #5   Status  Unable to assess   garments have not been delivered for fitting yet     OT LONG TERM GOAL #6   Baseline  Max A    Time  12    Period  Weeks    Status  Partially Met    Target Date  08/31/19            Plan - 06/30/19 1255    Clinical Impression Statement  Pt washed and wore newly issued custom RUE ccl 2 comnpression armsleeve and custom Elvarex seamless soft glove over the weekend. Problems with garment lengths continues, including finger stubs to long on all 5 digiys, and arm sleeve too long at top edge and bunching at elbow causing tourniquette effect. Completed new measurements confirming manuafacturer's error as initial anatomicals are correct,. Faxed and emailed DME vendor requesting remakes.  Also requested 3/4 top band on sleeve as full SB is irritating axillary cording. Pt tolerated myofacial release for stubbrn R axillary cording. Max A to done sleeve due to ime constraints. Cont as per POC. Fit remake ASAP.    OT Occupational Profile and History  Comprehensive Assessment- Review of records and extensive additional review of physical, cognitive, psychosocial history related to current functional performance    Occupational performance deficits (Please refer to evaluation for details):  ADL's;IADL's;Social Participation;Leisure;Rest and Sleep;Work    Marketing executive / Function / Physical Skills  ADL;Decreased knowledge of precautions;ROM;UE functional use;Decreased knowledge of use of DME;Edema;Skin integrity;Pain;IADL    Rehab Potential  Good    Clinical Decision Making  Several treatment options, min-mod task modification necessary    Comorbidities Affecting Occupational Performance:  Presence of comorbidities impacting occupational performance    Comorbidities impacting occupational performance description:  see SUBJUECTIVE    Modification or Assistance to Complete Evaluation   Min-Moderate modification of tasks or assist with assess necessary to complete eval    OT Frequency  2x / week    OT Duration  12 weeks    OT Treatment/Interventions  Self-care/ADL training;Therapeutic exercise;Manual lymph  drainage;Patient/family education;Therapeutic activities;DME and/or AE instruction;Manual Therapy;Other (comment)   consider fitting with prophylactic cmopression arm sleeve   Plan  BUE AROM ther ex to reduce axillary cording- 2 sets of 10 2-3 x daily    Consulted and Agree with Plan of Care  Patient       Patient will benefit from skilled therapeutic intervention in order to improve the following deficits and impairments:   Body Structure / Function / Physical Skills: ADL, Decreased knowledge of precautions, ROM, UE functional use, Decreased knowledge of use of DME, Edema, Skin  integrity, Pain, IADL       Visit Diagnosis: Postmastectomy lymphedema syndrome    Problem List Patient Active Problem List   Diagnosis Date Noted  . Edema 04/16/2019  . Postoperative wound infection 03/13/2019  . Seroma of breast 03/11/2019  . Age-related osteoporosis without current pathological fracture 02/27/2019  . Malignant neoplasm of central portion of right breast in female, estrogen receptor positive ([place]) 02/12/2019  . Genetic testing 01/31/2019  . Malignant neoplasm of upper-inner quadrant of right breast in female, estrogen receptor positive ([place]) 01/15/2019  . Long term current use of opiate analgesic 12/02/2018  . Tympanic membrane central perforation, left 11/04/2018  . DDD (degenerative disc disease), lumbosacral 10/10/2018  . Abnormal MRI, lumbar spine (08/29/2018) 10/10/2018  . 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 Lumbar nerve root impingement (Bilateral: L4 & L5) (L>R: L5) 10/10/2018  . Lumbar Spinal instability of L4-5 10/10/2018  . Lumbar facet hypertrophy 10/10/2018  . COPD (chronic obstructive pulmonary disease) with emphysema ([place]) 08/28/2018  . Elevated rheumatoid factor 08/13/2018  . Elevated C-reactive protein (CRP) 08/12/2018  . Elevated sed rate 08/12/2018  . Lumbar facet syndrome (Bilateral) 08/12/2018  . Back pain with left-sided radiculopathy 08/12/2018  . DDD (degenerative disc disease), lumbar 08/12/2018  . Chronic lower extremity pain (Primary Area of Pain) (Bilateral) (L>R) 07/23/2018  . Chronic low back pain (Secondary Area of Pain) (Bilateral) (L>R) w/ sciatica (Bilateral) 07/23/2018  . Chronic pain syndrome 07/23/2018  . Pharmacologic therapy 07/23/2018  . Disorder of skeletal system 07/23/2018  . Problems influencing health status 07/23/2018  . Chronic sacroiliac joint pain [facility] Area of Pain) (Bilateral) (L>R) 07/23/2018  . Status post lumboperitoneal shunt placement 02/26/2018  .  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 Tympanic membrane perforation 05/30/2016  . PMB (postmenopausal bleeding) 04/26/2016  . Rash 12/27/2015  . 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 Plantar fasciitis of foot (Left) 05/26/2015  . Spondylosis of lumbar region without myelopathy or radiculopathy 05/26/2015  . Lumbar foraminal stenosis (L3-4, L4-5, L5-S1) (L>R) 05/26/2015  . Spinal stenosis of lumbar region with neurogenic claudication 05/26/2015  . Encounter for screening mammogram for malignant neoplasm of breast 12/02/2014  . Goiter 12/02/2014  . Chronic idiopathic gout of foot (Right) 07/06/2014  . Biceps tendonitis (Right) 05/19/2014  . Shoulder pain (Right) 05/19/2014  . Facial weakness 05/05/2014  . Post herpetic neuralgia 05/05/2014  . Chronic dysfunction of left eustachian tube 03/24/2014  . Osteopenia 03/13/2014  . Lumbar Grade 1 Anterolisthesis of L3/4 (53m) & L4/5 (4-138m(w/ Dynamic instability) 03/13/2014  . Family history of colon cancer 12/02/2013  . Dyshidrotic eczema 11/17/2013  . Diastasis recti 04/08/2013  . Facial nerve paresis 07/31/2012  . Blepharospasm 05/08/2012  . 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 Obesity 03/28/2012  . Hemoptysis 12/22/2011  . Neuropathic  postherpetic trigeminal neuralgia 12/12/2011  . Allergic rhinitis, mild 10/12/2011  . Disequilibrium syndrome 09/27/2011  . Chronic bronchitis ([place]) 06/05/2011  . Essential hypertension 05/24/2011  . Type 2 diabetes mellitus without complications ([place]) 58/83/2549  . Pulmonary nodules 05/05/2011    Andrey Spearman, MS, OTR/L, [facility] 06/30/19 1:02 PM  [place] MAIN [facility] SERVICES [address] [place], [place], [address] Phone: [phone]   Fax:  [phone]  Name: Susan Callahan MRN: [id number] Date of Birth: Dec 01, 1950

## 2019-07-02 ENCOUNTER — Ambulatory Visit: Payer: Medicare Other | Admitting: Occupational Therapy

## 2019-07-02 ENCOUNTER — Other Ambulatory Visit: Payer: Self-pay

## 2019-07-02 ENCOUNTER — Ambulatory Visit: Payer: Medicare Other

## 2019-07-02 DIAGNOSIS — R2681 Unsteadiness on feet: Secondary | ICD-10-CM

## 2019-07-02 DIAGNOSIS — I972 Postmastectomy lymphedema syndrome: Secondary | ICD-10-CM

## 2019-07-02 DIAGNOSIS — M6281 Muscle weakness (generalized): Secondary | ICD-10-CM

## 2019-07-02 DIAGNOSIS — G8929 Other chronic pain: Secondary | ICD-10-CM

## 2019-07-02 DIAGNOSIS — M545 Low back pain: Secondary | ICD-10-CM | POA: Diagnosis not present

## 2019-07-02 DIAGNOSIS — R2689 Other abnormalities of gait and mobility: Secondary | ICD-10-CM

## 2019-07-02 NOTE — Therapy (Signed)
[place] MAIN [facility] SERVICES [address] [place], [place], [address] Phone: [phone]   Fax:  [phone]  Occupational Therapy Treatment  Patient Details  Name: Susan Callahan MRN: [id number] Date of Birth: 05/16/1951 Referring Provider (OT): Humberto Leep, MD   Encounter Date: 07/02/2019  OT End of Session - 07/02/19 1517    Visit Number  3    Number of Visits  36    Date for OT Re-Evaluation  09/18/19    Authorization Type  new POC requesting 36 additional visits 06/18/19    OT Start Time  0200    OT Stop Time  0300    OT Time Calculation (min)  60 min    Activity Tolerance  Patient tolerated treatment well;No increased pain    Behavior During Therapy  WFL for tasks assessed/performed       Past Medical History:  Diagnosis Date  . COPD (chronic obstructive pulmonary disease) ([place])   . Depression 1974  . Diabetes mellitus without complication ([place])   . Hypertension   . Migraine   . 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 LUMBAR PERITONEAL SHUNT    . TYMPANOSTOMY TUBE PLACEMENT      There were no vitals filed for this visit.  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 R arm and hand swelling and axillary cording. 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 01/30/19 partial mastectomy. Margins clear. 0/2 LN  negative for  disease. actinic keritosis,DDD, DM, obesity, thyroid nodules, pulmonary nodules, chronic back pain, HTN, COPD, hx depression    Limitations  pain and swelling in R axilla and arm, chronic back pain, muscle weakness, impaired balance, decreased RUE AROM, abnormal gait, difficulty walking, impaired functional mobility and transfers,    Repetition   Increases Symptoms    Special Tests  -Stemmer at MPs bilaterally    Patient Stated Goals  relduce pain and improve movement  in my R arm so I can do things I need to do. reduce risk of lymphedema    Pain Onset  More than a month ago    Pain Onset  More than a month ago                   OT Treatments/Exercises (OP) - 07/02/19 0001      Manual Therapy   Manual Therapy  Edema management    Soft tissue mobilization  fibrosis techniques to dorsal hand    Myofascial Release  for cord release R axilla    Manual Lymphatic Drainage (MLD)  MLD to RUE/RUQ as established using cntalateral AAA    Compression Bandaging  custom compression armsleeve OVER glove today in effort to ease transition at wrist. Pt required mod A fand assistive device for donning             OT Education - 07/02/19 1517    Education Details  Cont Pt edu for donning and doffing compression armsleeve and custom glove using assistive device- OT fabricated "hand slippie". Good return    Person(s) Educated  Patient    Methods  Demonstration;Explanation;Tactile cues;Verbal cues;Handout    Comprehension  Verbalized understanding;Returned demonstration  OT Long Term Goals - 06/19/19 1400      OT LONG TERM GOAL #5   Status  Unable to assess   garments have not been delivered for fitting yet     OT LONG TERM GOAL #6   Baseline  Max A    Time  12    Period  Weeks    Status  Partially Met    Target Date  08/31/19            Plan - 07/02/19 1518    Clinical Impression Statement  Cuff of sleeve leaving slight puddle of swelling at volar wrist. Donned glove UNDER sleeve today in effort to wase transition and reduce swelling. Pt tolerated manual therapy for both MLD and myofascial release for limb swelling and axillary cording. Pt able to don sleeve with mod A today using assistive device   , which is progressed from Max A at initial fitting. Cont as per POC.    OT Occupational Profile and  History  Comprehensive Assessment- Review of records and extensive additional review of physical, cognitive, psychosocial history related to current functional performance    Occupational performance deficits (Please refer to evaluation for details):  ADL's;IADL's;Social Participation;Leisure;Rest and Sleep;Work    Marketing executive / Function / Physical Skills  ADL;Decreased knowledge of precautions;ROM;UE functional use;Decreased knowledge of use of DME;Edema;Skin integrity;Pain;IADL    Rehab Potential  Good    Clinical Decision Making  Several treatment options, min-mod task modification necessary    Comorbidities Affecting Occupational Performance:  Presence of comorbidities impacting occupational performance    Comorbidities impacting occupational performance description:  see SUBJUECTIVE    Modification or Assistance to Complete Evaluation   Min-Moderate modification of tasks or assist with assess necessary to complete eval    OT Frequency  2x / week    OT Duration  12 weeks    OT Treatment/Interventions  Self-care/ADL training;Therapeutic exercise;Manual lymph drainage;Patient/family education;Therapeutic activities;DME and/or AE instruction;Manual Therapy;Other (comment)   consider fitting with prophylactic cmopression arm sleeve   Plan  BUE AROM ther ex to reduce axillary cording- 2 sets of 10 2-3 x daily    Consulted and Agree with Plan of Care  Patient       Patient will benefit from skilled therapeutic intervention in order to improve the following deficits and impairments:   Body Structure / Function / Physical Skills: ADL, Decreased knowledge of precautions, ROM, UE functional use, Decreased knowledge of use of DME, Edema, Skin integrity, Pain, IADL       Visit Diagnosis: Postmastectomy lymphedema syndrome    Problem List Patient Active Problem List   Diagnosis Date Noted  . Edema 04/16/2019  . Postoperative wound infection 03/13/2019  . Seroma of breast 03/11/2019  .  Age-related osteoporosis without current pathological fracture 02/27/2019  . Malignant neoplasm of central portion of right breast in female, estrogen receptor positive ([place]) 02/12/2019  . Genetic testing 01/31/2019  . Malignant neoplasm of upper-inner quadrant of right breast in female, estrogen receptor positive ([place]) 01/15/2019  . Long term current use of opiate analgesic 12/02/2018  . Tympanic membrane central perforation, left 11/04/2018  . DDD (degenerative disc disease), lumbosacral 10/10/2018  . Abnormal MRI, lumbar spine (08/29/2018) 10/10/2018  . 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 Lumbar nerve root impingement (Bilateral: L4 & L5) (L>R: L5) 10/10/2018  . Lumbar Spinal instability of L4-5 10/10/2018  . Lumbar facet hypertrophy 10/10/2018  . COPD (chronic obstructive pulmonary disease) with  emphysema ([place]) 08/28/2018  . Elevated rheumatoid factor 08/13/2018  . Elevated C-reactive protein (CRP) 08/12/2018  . Elevated sed rate 08/12/2018  . Lumbar facet syndrome (Bilateral) 08/12/2018  . Back pain with left-sided radiculopathy 08/12/2018  . DDD (degenerative disc disease), lumbar 08/12/2018  . Chronic lower extremity pain (Primary Area of Pain) (Bilateral) (L>R) 07/23/2018  . Chronic low back pain (Secondary Area of Pain) (Bilateral) (L>R) w/ sciatica (Bilateral) 07/23/2018  . Chronic pain syndrome 07/23/2018  . Pharmacologic therapy 07/23/2018  . Disorder of skeletal system 07/23/2018  . Problems influencing health status 07/23/2018  . Chronic sacroiliac joint pain [facility] Area of Pain) (Bilateral) (L>R) 07/23/2018  . Status post lumboperitoneal shunt placement 02/26/2018  . 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 Tympanic membrane perforation 05/30/2016  . PMB (postmenopausal bleeding) 04/26/2016  . Rash 12/27/2015  . 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 Plantar fasciitis of foot (Left) 05/26/2015  . Spondylosis of lumbar region without myelopathy or radiculopathy 05/26/2015  . Lumbar foraminal stenosis (L3-4, L4-5, L5-S1) (L>R) 05/26/2015  . Spinal stenosis of lumbar region with neurogenic claudication 05/26/2015  . Encounter for screening mammogram for malignant neoplasm of breast 12/02/2014  . Goiter 12/02/2014  . Chronic idiopathic gout of foot (Right) 07/06/2014  . Biceps tendonitis (Right) 05/19/2014  . Shoulder pain (Right) 05/19/2014  . Facial weakness 05/05/2014  . Post herpetic neuralgia 05/05/2014  . Chronic dysfunction of left eustachian tube 03/24/2014  . Osteopenia 03/13/2014  . Lumbar Grade 1 Anterolisthesis of L3/4 (42m) & L4/5 (4-124m(w/ Dynamic instability) 03/13/2014  . Family history of colon cancer 12/02/2013  . Dyshidrotic eczema 11/17/2013  . Diastasis recti 04/08/2013  . Facial nerve paresis 07/31/2012  . Blepharospasm 05/08/2012  . 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 Obesity 03/28/2012  . Hemoptysis 12/22/2011  . Neuropathic postherpetic trigeminal neuralgia 12/12/2011  . Allergic rhinitis, mild 10/12/2011  . Disequilibrium syndrome 09/27/2011  . Chronic bronchitis (HCS[place]ring09/24/2012  . Essential hypertension 05/24/2011  . Type 2 diabetes mellitus without complications ([id number]. Pulmonary nodules 05/05/2011    ThAndrey SpearmanMS, OTR/L, CLMeade District Hospital0/21/20 3:22 PM  CoSandy SpringsAIN REOzark HealthERVICES [address] [id number]: [phone] Fax:  [phone]: Susan KroezeRN: [id number] of Birth: 6/September 22, 1950

## 2019-07-02 NOTE — Therapy (Signed)
[place] MAIN [facility] SERVICES [address] [place], [place], [address] Phone: [phone]   Fax:  [phone]  Physical Therapy Treatment  Patient Details  Name: Susan Callahan MRN: [id number] Date of Birth: 03-19-1951 Referring Provider (PT): Francesco Sor   Encounter Date: 07/02/2019  PT End of Session - 07/02/19 1553    Visit Number  62    Number of Visits  65    Date for PT Re-Evaluation  07/10/19    Authorization Type  3/10 Pn 10/6    PT Start Time  1517    PT Stop Time  1600    PT Time Calculation (min)  43 min    Equipment Utilized During Treatment  Gait belt    Activity Tolerance  Patient tolerated treatment well;Patient limited by fatigue    Behavior During Therapy  WFL for tasks assessed/performed       Past Medical History:  Diagnosis Date  . COPD (chronic obstructive pulmonary disease) ([place])   . Depression 1974  . Diabetes mellitus without complication ([place])   . Hypertension   . Migraine   . 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 LUMBAR PERITONEAL SHUNT    . TYMPANOSTOMY TUBE PLACEMENT      There were no vitals filed for this visit.  Subjective Assessment - 07/02/19 1552    Subjective  Patient reports compliance with HEP. Has limited pain at this time. No falls or LOB since last session.    Pertinent History  Patient reports balance issues began in 2008. 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  Balance deficit has been constant since 2008, some days are worse than others but is always present to patient. Does not notice a directionality of loss of balance. Patient reports her balance is affecting her standing, walking on uneven surfaces such as outside, trying to walk with dim light, showering and ADLs. Slight visual deficit of R lower quadrant. 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 Has nystagmus when looking to the R.   Back pain began in 2007 and has gradually worsened to nonstop pain. Medication helps briefly. Patient is challenged with standing with knees extended due to back pain.    Limitations  Lifting;Standing;Walking;House hold activities    How long can you stand comfortably?  pain begins immediately     How long can you walk comfortably?  pain begins immediately     Patient Stated Goals  walk better, have less pain, be able to walk in the grocery store with head turns to scan shelves    Currently in Pain?  No/denies           Treatment:   Prone:  Roller to thoracolumbar paraspinals, piriformis, hamstrings, and calves x 8  minutes   Seated: Hamstring stretch on side of treadmill 60 seconds each LE  prostretch df/pf rocking 60 seconds; x2 trials  Swiss ball: forward rollout 10x 8 second holds, lateral/diagonal 10x 8 second holds   Standing:  Ambulate 86 ft x 2 trials with horizontal head turn, decreased step length, arm swing, and foot clearance initially, improved with repetition.   Standing swiss ball press against table 10x 5 second holds     Treadmill 2.0 mph, alternating between 1-2% incline for interval training 7 minutes. Cueing for abdominal activation and sequencing of muscle activation. Able to maintain heel strike and neutral spinal alignment with BUE support.  Education on posture and core contraction in standing for alignment and stability   Pt educated throughout session about proper posture and technique with exercises. Improved exercise technique, movement at target joints, use of target muscles after min to mod verbal, visual, tactile cues                          PT Education - 07/02/19 1552    Education Details  exercise technique, body mechanics    Person(s) Educated  Patient    Methods  Explanation;Demonstration;Tactile cues;Verbal cues    Comprehension  Verbalized understanding;Returned demonstration;Verbal cues required;Tactile cues  required       PT Short Term Goals - 06/12/19 1514      PT SHORT TERM GOAL #1   Title  Patient will be independent with completion of HEP to improve ability to complete functional tasks and functional ADLs.    Baseline  10/23: will give HEP next session 11/19: HEP compliance    Time  2    Period  Weeks    Status  Achieved      PT SHORT TERM GOAL #2   Title  Patient will report pain score less than 4/10 on VAS to demonstrate reduction of pain levels with functional tasks.     Baseline  10/23: 6/10 11/19: 5/10 12/12: 9/10 1/10: 7/10  2/11: 3/10     Time  2    Period  Weeks    Status  Achieved        PT Long Term Goals - 06/12/19 1514      PT LONG TERM GOAL #1   Title  Patient will report pain score of <3/10 on VAS for decreased pain levels and ease with functional activities.    Baseline  10/23: 6/10 11/19: 5/10 12/12 9/10 1/10: 7/10 2/11: 3/10 3/9: 6/10  5/26: 8/10 03/17/19: 7/10 8/3: 5/10 8/25: 4/10 9/21: 4/10    Time  4    Period  Weeks    Status  Partially Met    Target Date  07/10/19      PT LONG TERM GOAL #2   Title  Patient will be able to ambulate on treadmill for 30mnutes without increase in back/leg pain demonstrating improved community ambulation and treadmill walking at pulmonary rehab    Baseline  10/23: 037mutes increases pain 11/19: 17m70mtes 12/12: has not had the chance to perform 1/10: was able to do 5 minutes without pain 2/11: able to do 5 minutes then had to bend over 3/9: 5 minutes 30 seconds at 2.0 mphs 5/26: had to walk 10 minutes at cancer center with extreme pain but was able to perform 7/6: walks 10 minutes with increase of pain by 2 points 8/3: able to perform with bent over posture 9/21: able to perform 7 minutes with intervals of 1-2% incline    Time  4    Period  Weeks    Status  Achieved      PT LONG TERM GOAL #3   Title  Patient will increase dynamic gait index score to >19/24 as to demonstrate reduced fall risk and improved dynamic gait balance  for better safety with community/home ambulation.    Baseline  10/1: 11/24    Time  4    Period  Weeks    Status  New    Target Date  07/10/19      PT LONG TERM GOAL #4   Title  Patient will score 4+/5  on BLE MMT to demonstrate improved glute strength and ability to carry groceries with increased ease up/down stairs at home.    Baseline  10/23: 4-/5 grossly 11/19: 4/5 grossly; hamstrings 4-/5 12/12: 4/5 gross 1/10: 4/5 gross 2/11: gross 4+/5 with hamstrings and abuductors 4-/5 3/9: gross 4+/5 hamstrings and abductors 4-/5 5/27: grossly 3+/5 with pain with LLE movements 7/6: grossly 4-/5 with pain with LLE movement 8/3: deferred due to swelling/pain 8/25: grossly 4/5 without pain 9/21: grossly 4/5 with hamstrings not tested due to spasm    Time  4    Period  Weeks    Status  Partially Met    Target Date  07/10/19      PT LONG TERM GOAL #5   Title  Patient will increase six minute walk test distance to >1000 with minimal increase of pain for progression to community ambulator and improve gait ability    Baseline  10/1:580 ft stopped at 3: 58 due to excessive pain    Time  4    Period  Weeks    Status  New    Target Date  07/10/19      PT LONG TERM GOAL #6   Title  Patient will be able to make and/or strip the bed without pain and without needing rest breaks to perform iADLs independently.     Baseline  2/11: pain limits ability to perform, ~4 breaks required 3/9: takes her time, 3 breaks 5/27: very painful still 7/6: requires only one rest break 8/3: able to perform with +2 pain increase 8/25: no rest breaks required, increased pain 4 points 9/21: able to perform with +1-2 symptoms    Time  4    Period  Weeks    Status  Partially Met    Target Date  07/10/19      PT LONG TERM GOAL #7   Title  Patient will have decreased modified oswestry score <20% for improved ability to complete household tasks with less pain levels.     Baseline  2/11: 30%  3/9: 30% 5/27: 63% 7/6: 38% 8/3: 42% 8/25:  36% 9/21: 30%    Time  4    Period  Weeks    Status  Partially Met    Target Date  07/10/19      PT LONG TERM GOAL #8   Title  Patient will increase Berg Balance score by > 6 points (50/56) to demonstrate decreased fall risk during functional activities.    Baseline  10/1: 44/56    Time  4    Period  Weeks    Status  New    Target Date  07/10/19            Plan - 07/02/19 1556    Clinical Impression Statement  Patient is challenged with stability during head turns with ambulation with decreased gait mechanics resulting compensatory movements, patient improved with verbal cueing for orientation. Dorsiflexion/plantarflexion rocking on prostretch is pain reliving and assists in gait mechanics.Patient will benefit from skilled physical therapy to increase balance, improve strength, decrease pain, and improve functional capacity of mobility for improved quality of life    Personal Factors and Comorbidities  Age;Comorbidity 3+;Fitness;Finances;Past/Current Experience;Social Background;Time since onset of injury/illness/exacerbation    Comorbidities  COPD, Ramsay Hunt Syndrome, HTN, DM, lymphedema, breast cancer    Examination-Activity Limitations  Bathing;Bend;Carry;Dressing;Hygiene/Grooming;Stairs;Squat;Reach Overhead;Locomotion Level;Lift;Stand;Transfers    Examination-Participation Restrictions  Cleaning;Community Activity;Laundry;Volunteer;Shop;Personal Finances;Yard Work    Nurse, learning disability  Potential  Fair    PT Frequency  2x / week    PT Duration  4 weeks    PT Treatment/Interventions  ADLs/Self Care Home Management;Cryotherapy;Moist Heat;Traction;Iontophoresis 69m/ml Dexamethasone;Ultrasound;Gait training;Stair training;Functional mobility training;Neuromuscular re-education;Balance training;Therapeutic exercise;Therapeutic activities;Patient/family education;Manual techniques;Energy conservation;Aquatic Therapy;Electrical  Stimulation;Passive range of motion;Dry needling;Taping;Vestibular;Canalith Repostioning    PT Next Visit Plan  VOR, walking with head turns    PT Home Exercise Plan  VOR    Consulted and Agree with Plan of Care  Patient       Patient will benefit from skilled therapeutic intervention in order to improve the following deficits and impairments:  Abnormal gait, Cardiopulmonary status limiting activity, Decreased activity tolerance, Decreased balance, Decreased endurance, Decreased range of motion, Difficulty walking, Decreased strength, Hypomobility, Decreased mobility, Impaired perceived functional ability, Impaired flexibility, Postural dysfunction, Obesity, Improper body mechanics, Pain, Dizziness, Increased edema  Visit Diagnosis: Chronic low back pain, unspecified back pain laterality, unspecified whether sciatica present  Muscle weakness (generalized)  Other abnormalities of gait and mobility  Unsteadiness on feet     Problem List Patient Active Problem List   Diagnosis Date Noted  . Edema 04/16/2019  . Postoperative wound infection 03/13/2019  . Seroma of breast 03/11/2019  . Age-related osteoporosis without current pathological fracture 02/27/2019  . Malignant neoplasm of central portion of right breast in female, estrogen receptor positive ([place] 02/12/2019  . Genetic testing 01/31/2019  . Malignant neoplasm of upper-inner quadrant of right breast in female, estrogen receptor positive (HEl[place] 01/15/2019  . Long term current use of opiate analgesic 12/02/2018  . Tympanic membrane central perforation, left 11/04/2018  . DDD (degenerative disc disease), lumbosacral 10/10/2018  . Abnormal MRI, lumbar spine (08/29/2018) 10/10/2018  . 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 Lumbar nerve root impingement (Bilateral: L4 & L5) (L>R: L5) 10/10/2018  . Lumbar Spinal instability of L4-5 10/10/2018  . Lumbar facet hypertrophy 10/10/2018  .  COPD (chronic obstructive pulmonary disease) with emphysema ([place] 08/28/2018  . Elevated rheumatoid factor 08/13/2018  . Elevated C-reactive protein (CRP) 08/12/2018  . Elevated sed rate 08/12/2018  . Lumbar facet syndrome (Bilateral) 08/12/2018  . Back pain with left-sided radiculopathy 08/12/2018  . DDD (degenerative disc disease), lumbar 08/12/2018  . Chronic lower extremity pain (Primary Area of Pain) (Bilateral) (L>R) 07/23/2018  . Chronic low back pain (Secondary Area of Pain) (Bilateral) (L>R) w/ sciatica (Bilateral) 07/23/2018  . Chronic pain syndrome 07/23/2018  . Pharmacologic therapy 07/23/2018  . Disorder of skeletal system 07/23/2018  . Problems influencing health status 07/23/2018  . Chronic [facility]roiliac joint [facility] (Bilateral) (L>R) 07/23/2018  . Status post lumboperitoneal shunt placement 02/26/2018  . 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 Tympanic membrane perforation 05/30/2016  . PMB (postmenopausal bleeding) 04/26/2016  . Rash 12/27/2015  . 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 Plantar fasciitis of foot (Left) 05/26/2015  . Spondylosis of lumbar region without myelopathy or radiculopathy 05/26/2015  . Lumbar foraminal stenosis (L3-4, L4-5, L5-S1) (L>R) 05/26/2015  . Spinal stenosis of lumbar region with neurogenic claudication 05/26/2015  . Encounter for screening mammogram for malignant neoplasm of breast 12/02/2014  . Goiter 12/02/2014  . Chronic idiopathic gout of foot (Right) 07/06/2014  . Biceps tendonitis (Right) 05/19/2014  . Shoulder pain (Right) 05/19/2014  . Facial weakness 05/05/2014  . Post herpetic neuralgia 05/05/2014  . Chronic dysfunction of left eustachian tube 03/24/2014  . Osteopenia 03/13/2014  .  Lumbar Grade 1 Anterolisthesis of L3/4 (62m) & L4/5 (4-145m(w/ Dynamic instability) 03/13/2014  . Family history of colon cancer 12/02/2013  .  Dyshidrotic eczema 11/17/2013  . Diastasis recti 04/08/2013  . Facial nerve paresis 07/31/2012  . Blepharospasm 05/08/2012  . 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 Obesity 03/28/2012  . Hemoptysis 12/22/2011  . Neuropathic postherpetic trigeminal neuralgia 12/12/2011  . Allergic rhinitis, mild 10/12/2011  . Disequilibrium syndrome 09/27/2011  . Chronic bronchitis (HCRapid City09/24/2012  . Essential hypertension 05/24/2011  . Type 2 diabetes mellitus without complications ([id number]. Pulmonary nodules 05/05/2011   MaJanna ArchPT, DPT   07/02/2019, 4:40 PM  CoWestphaliaAIN REFirst Surgery Suites LLCERVICES [address]dNorth Redington Beach[place]hone: [phone] Fax:  [phone]: LiLaurren LepkowskiRN: [id number] of Birth: 6/September 26, 1950

## 2019-07-07 ENCOUNTER — Other Ambulatory Visit: Payer: Self-pay

## 2019-07-07 ENCOUNTER — Ambulatory Visit: Payer: Medicare Other

## 2019-07-07 ENCOUNTER — Ambulatory Visit: Payer: Medicare Other | Admitting: Occupational Therapy

## 2019-07-07 DIAGNOSIS — G8929 Other chronic pain: Secondary | ICD-10-CM

## 2019-07-07 DIAGNOSIS — M545 Low back pain, unspecified: Secondary | ICD-10-CM

## 2019-07-07 DIAGNOSIS — I890 Lymphedema, not elsewhere classified: Secondary | ICD-10-CM

## 2019-07-07 DIAGNOSIS — M6281 Muscle weakness (generalized): Secondary | ICD-10-CM

## 2019-07-07 DIAGNOSIS — R2689 Other abnormalities of gait and mobility: Secondary | ICD-10-CM

## 2019-07-07 NOTE — Therapy (Signed)
[place] MAIN [facility] SERVICES [address] [place], [place], [address] Phone: [phone]   Fax:  [phone]  Physical Therapy Treatment/ RECERT  Patient Details  Name: Susan Callahan MRN: [id number] Date of Birth: 07-09-1951 No data recorded  Encounter Date: 07/07/2019  PT End of Session - 07/07/19 1112    Visit Number  5    Number of Visits  72    Date for PT Re-Evaluation  07/10/19    Authorization Type  4/10 Pn 10/6    PT Start Time  1108    PT Stop Time  1156    PT Time Calculation (min)  48 min    Equipment Utilized During Treatment  Gait belt    Activity Tolerance  Patient tolerated treatment well;Patient limited by fatigue    Behavior During Therapy  WFL for tasks assessed/performed       Past Medical History:  Diagnosis Date  . COPD (chronic obstructive pulmonary disease) ([place])   . Depression 1974  . Diabetes mellitus without complication ([place])   . Hypertension   . Migraine   . 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 LUMBAR PERITONEAL SHUNT    . TYMPANOSTOMY TUBE PLACEMENT      There were no vitals filed for this visit.  Subjective Assessment - 07/07/19 1111    Subjective  Patient reports having abdominal/GI "issues" today. Woke up with RLE cramping that relieved with stretching. No falls or LOB since last session. Has been noticing less fequent pain.    Pertinent History  Patient reports balance issues began in 2008. 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  Balance deficit has been constant since 2008, some days are worse than others but is always present to patient. Does not notice a directionality of loss of balance. Patient reports her balance is affecting her standing, walking on uneven surfaces such as outside, trying to walk with dim light, showering and ADLs. Slight visual deficit of R lower quadrant. Left eye is affected  by Virl Axe. Has nystagmus when looking to the R.  Back pain began in 2007 and has gradually worsened to nonstop pain. Medication helps briefly. Patient is challenged with standing with knees extended due to back pain.    Limitations  Lifting;Standing;Walking;House hold activities    How long can you stand comfortably?  pain begins immediately     How long can you walk comfortably?  pain begins immediately     Patient Stated Goals  walk better, have less pain, be able to walk in the grocery store with head turns to scan shelves    Currently in Pain?  Yes    Pain Score  1     Pain Location  Back    Pain Orientation  Lower    Pain Descriptors / Indicators  Aching    Pain Type  Chronic pain    Pain Onset  More than a month ago    Pain Frequency  Constant       [facility] PT Assessment - 07/07/19 0001      Balance   Balance Assessed  Yes      Standardized Balance Assessment   Standardized Balance Assessment  Berg Balance Test;Dynamic Gait Index      Berg Balance Test   Sit to Stand  Able to stand without using hands and stabilize independently    Standing Unsupported  Able to  stand safely 2 minutes    Sitting with Back Unsupported but Feet Supported on Floor or Stool  Able to sit safely and securely 2 minutes    Stand to Sit  Sits safely with minimal use of hands    Transfers  Able to transfer safely, minor use of hands    Standing Unsupported with Eyes Closed  Able to stand 10 seconds with supervision    Standing Unsupported with Feet Together  Able to place feet together independently and stand 1 minute safely    From Standing, Reach Forward with Outstretched Arm  Can reach forward >12 cm safely (5")    From Standing Position, Pick up Object from Floor  Able to pick up shoe safely and easily    From Standing Position, Turn to Look Behind Over each Shoulder  Looks behind from both sides and weight shifts well    Turn 360 Degrees  Able to turn 360 degrees safely one side only in 4 seconds  or less    Standing Unsupported, Alternately Place Feet on Step/Stool  Able to stand independently and safely and complete 8 steps in 20 seconds    Standing Unsupported, One Foot in Front  Able to take small step independently and hold 30 seconds    Standing on One Leg  Able to lift leg independently and hold equal to or more than 3 seconds    Total Score  49      Dynamic Gait Index   Level Surface  Normal    Change in Gait Speed  Mild Impairment    Gait with Horizontal Head Turns  Mild Impairment    Gait with Vertical Head Turns  Moderate Impairment    Gait and Pivot Turn  Normal    Step Over Obstacle  Mild Impairment    Step Around Obstacles  Normal    Steps  Mild Impairment    Total Score  18      woke up with R muscle near cramps this morning.  Goals:  VAS low back pain: 4/10, less frequently occurring ~2x/week now.  6 minute walk test: 950  BERG: 49/56  DGI: 18/24 MODI 26% LEFS 53/80    Prone:  Roller to thoracolumbar paraspinals, piriformis, hamstrings, and calves x44mnutes  Breathing education: education on variety of breathing techniques for carryover to ambulation and exercises.       Pt educated throughout session about proper posture and technique with exercises. Improved exercise technique, movement at target joints, use of target muscles after min to mod verbal, visual, tactile cues            PT Education - 07/07/19 1112    Education Details  goals, POC    Person(s) Educated  Patient    Methods  Explanation    Comprehension  Verbalized understanding       PT Short Term Goals - 07/07/19 1217      PT SHORT TERM GOAL #1   Title  Patient will be independent with completion of HEP to improve ability to complete functional tasks and functional ADLs.    Baseline  10/23: will give HEP next session 11/19: HEP compliance    Time  2    Period  Weeks    Status  Achieved      PT SHORT TERM GOAL #2   Title  Patient will report pain score less  than 4/10 on VAS to demonstrate reduction of pain levels with functional tasks.     Baseline  10/23: 6/10 11/19: 5/10 12/12: 9/10 1/10: 7/10  2/11: 3/10     Time  2    Period  Weeks    Status  Achieved        PT Long Term Goals - 07/07/19 1108      PT LONG TERM GOAL #1   Title  Patient will report pain score of <3/10 on VAS for decreased pain levels and ease with functional activities.    Baseline  10/23: 6/10 11/19: 5/10 12/12 9/10 1/10: 7/10 2/11: 3/10 3/9: 6/10  5/26: 8/10 03/17/19: 7/10 8/3: 5/10 8/25: 4/10 9/21: 4/10 10/26: 4/10    Time  4    Period  Weeks    Status  Partially Met    Target Date  08/04/19      PT LONG TERM GOAL #2   Title  Patient will be able to ambulate on treadmill for 59mnutes without increase in back/leg pain demonstrating improved community ambulation and treadmill walking at pulmonary rehab    Baseline  10/23: 094mutes increases pain 11/19: 16m67mtes 12/12: has not had the chance to perform 1/10: was able to do 5 minutes without pain 2/11: able to do 5 minutes then had to bend over 3/9: 5 minutes 30 seconds at 2.0 mphs 5/26: had to walk 10 minutes at cancer center with extreme pain but was able to perform 7/6: walks 10 minutes with increase of pain by 2 points 8/3: able to perform with bent over posture 9/21: able to perform 7 minutes with intervals of 1-2% incline    Time  4    Period  Weeks    Status  Achieved      PT LONG TERM GOAL #3   Title  Patient will increase dynamic gait index score to >19/24 as to demonstrate reduced fall risk and improved dynamic gait balance for better safety with community/home ambulation.    Baseline  10/1: 11/24 10/26: 18/24    Time  4    Period  Weeks    Status  Partially Met    Target Date  08/04/19      PT LONG TERM GOAL #4   Title  Patient will score 4+/5 on BLE MMT to demonstrate improved glute strength and ability to carry groceries with increased ease up/down stairs at home.    Baseline  10/23: 4-/5 grossly 11/19:  4/5 grossly; hamstrings 4-/5 12/12: 4/5 gross 1/10: 4/5 gross 2/11: gross 4+/5 with hamstrings and abuductors 4-/5 3/9: gross 4+/5 hamstrings and abductors 4-/5 5/27: grossly 3+/5 with pain with LLE movements 7/6: grossly 4-/5 with pain with LLE movement 8/3: deferred due to swelling/pain 8/25: grossly 4/5 without pain 9/21: grossly 4/5 with hamstrings not tested due to spasm    Time  4    Period  Weeks    Status  Partially Met    Target Date  08/04/19      PT LONG TERM GOAL #5   Title  Patient will increase six minute walk test distance to >1000 with minimal increase of pain for progression to community ambulator and improve gait ability    Baseline  10/1:580 ft stopped at 3: 58 due to excessive pain 10/26: 950 ft    Time  4    Period  Weeks    Status  Partially Met    Target Date  08/04/19      PT LONG TERM GOAL #6   Title  Patient will be able to make and/or strip the bed without  pain and without needing rest breaks to perform iADLs independently.     Baseline  2/11: pain limits ability to perform, ~4 breaks required 3/9: takes her time, 3 breaks 5/27: very painful still 7/6: requires only one rest break 8/3: able to perform with +2 pain increase 8/25: no rest breaks required, increased pain 4 points 9/21: able to perform with +1-2 symptoms 10/26: able to perform with only occasional symptoms.    Time  4    Period  Weeks    Status  Achieved      PT LONG TERM GOAL #7   Title  Patient will have decreased modified oswestry score <20% for improved ability to complete household tasks with less pain levels.     Baseline  2/11: 30%  3/9: 30% 5/27: 63% 7/6: 38% 8/3: 42% 8/25: 36% 9/21: 30% 10/26: 26%    Time  4    Period  Weeks    Status  Partially Met    Target Date  08/04/19      PT LONG TERM GOAL #8   Title  Patient will increase Berg Balance score by > 6 points (50/56) to demonstrate decreased fall risk during functional activities.    Baseline  10/1: 44/56 10/26: 49/56    Time  4     Period  Weeks    Status  Partially Met    Target Date  08/04/19            Plan - 07/07/19 1252    Clinical Impression Statement  Patient has progressed towards her goals at this time. Her DGI score has increased by 7 points indicating improved stability with functional mobility and decreasing her risk of falls. She continues to be challenged with vertical head turns, stairs, and stepping over. Capacity for functional ambulation is increasing with patient completing full 6 minute walk test and is able to ambulate 950 ft. Her back pain is improving with decreased frequency of pain and improved function. Maximum improvement is yet to be obtained. The anticipated improvement is attainable and reasonable in a generally predictable time.Patient will benefit from skilled physical therapy to increase balance, improve strength, decrease pain, and improve functional capacity of mobility for improved quality of life.    Personal Factors and Comorbidities  Age;Comorbidity 3+;Fitness;Finances;Past/Current Experience;Social Background;Time since onset of injury/illness/exacerbation    Comorbidities  COPD, Ramsay Hunt Syndrome, HTN, DM, lymphedema, breast cancer    Examination-Activity Limitations  Bathing;Bend;Carry;Dressing;Hygiene/Grooming;Stairs;Squat;Reach Overhead;Locomotion Level;Lift;Stand;Transfers    Examination-Participation Restrictions  Cleaning;Community Activity;Laundry;Volunteer;Shop;Personal Finances;Yard Work    [employer]  Evolving/Moderate complexity    Rehab Potential  Fair    PT Frequency  2x / week    PT Duration  4 weeks    PT Treatment/Interventions  ADLs/Self Care Home Management;Cryotherapy;Moist Heat;Traction;Iontophoresis 91m/ml Dexamethasone;Ultrasound;Gait training;Stair training;Functional mobility training;Neuromuscular re-education;Balance training;Therapeutic exercise;Therapeutic activities;Patient/family education;Manual techniques;Energy  conservation;Aquatic Therapy;Electrical Stimulation;Passive range of motion;Dry needling;Taping;Vestibular;Canalith Repostioning    PT Next Visit Plan  VOR, walking with head turns    PT Home Exercise Plan  VOR    Consulted and Agree with Plan of Care  Patient       Patient will benefit from skilled therapeutic intervention in order to improve the following deficits and impairments:  Abnormal gait, Cardiopulmonary status limiting activity, Decreased activity tolerance, Decreased balance, Decreased endurance, Decreased range of motion, Difficulty walking, Decreased strength, Hypomobility, Decreased mobility, Impaired perceived functional ability, Impaired flexibility, Postural dysfunction, Obesity, Improper body mechanics, Pain, Dizziness, Increased edema  Visit Diagnosis: Chronic low back  pain, unspecified back pain laterality, unspecified whether sciatica present  Muscle weakness (generalized)  Other abnormalities of gait and mobility     Problem List Patient Active Problem List   Diagnosis Date Noted  . Edema 04/16/2019  . Postoperative wound infection 03/13/2019  . Seroma of breast 03/11/2019  . Age-related osteoporosis without current pathological fracture 02/27/2019  . Malignant neoplasm of central portion of right breast in female, estrogen receptor positive ([place]) 02/12/2019  . Genetic testing 01/31/2019  . Malignant neoplasm of upper-inner quadrant of right breast in female, estrogen receptor positive ([place]) 01/15/2019  . Long term current use of opiate analgesic 12/02/2018  . Tympanic membrane central perforation, left 11/04/2018  . DDD (degenerative disc disease), lumbosacral 10/10/2018  . Abnormal MRI, lumbar spine (08/29/2018) 10/10/2018  . 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 Lumbar nerve root impingement (Bilateral: L4 & L5) (L>R: L5) 10/10/2018  . Lumbar Spinal instability of L4-5 10/10/2018  . Lumbar facet hypertrophy  10/10/2018  . COPD (chronic obstructive pulmonary disease) with emphysema ([place]) 08/28/2018  . Elevated rheumatoid factor 08/13/2018  . Elevated C-reactive protein (CRP) 08/12/2018  . Elevated sed rate 08/12/2018  . Lumbar facet syndrome (Bilateral) 08/12/2018  . Back pain with left-sided radiculopathy 08/12/2018  . DDD (degenerative disc disease), lumbar 08/12/2018  . Chronic lower extremity pain (Primary Area of Pain) (Bilateral) (L>R) 07/23/2018  . Chronic low back pain (Secondary Area of Pain) (Bilateral) (L>R) w/ sciatica (Bilateral) 07/23/2018  . Chronic pain syndrome 07/23/2018  . Pharmacologic therapy 07/23/2018  . Disorder of skeletal system 07/23/2018  . Problems influencing health status 07/23/2018  . Chronic sacroiliac joint pain [facility]) (Bilateral) (L>R) 07/23/2018  . Status post lumboperitoneal shunt placement 02/26/2018  . 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 Tympanic membrane perforation 05/30/2016  . PMB (postmenopausal bleeding) 04/26/2016  . Rash 12/27/2015  . 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 Plantar fasciitis of foot (Left) 05/26/2015  . Spondylosis of lumbar region without myelopathy or radiculopathy 05/26/2015  . Lumbar foraminal stenosis (L3-4, L4-5, L5-S1) (L>R) 05/26/2015  . Spinal stenosis of lumbar region with neurogenic claudication 05/26/2015  . Encounter for screening mammogram for malignant neoplasm of breast 12/02/2014  . Goiter 12/02/2014  . Chronic idiopathic gout of foot (Right) 07/06/2014  . Biceps tendonitis (Right) 05/19/2014  . Shoulder pain (Right) 05/19/2014  . Facial weakness 05/05/2014  . Post herpetic neuralgia 05/05/2014  . Chronic dysfunction of left eustachian tube 03/24/2014  . Osteopenia 03/13/2014  . Lumbar Grade 1 Anterolisthesis of L3/4 (109m) & L4/5 (4-162m(w/ Dynamic instability) 03/13/2014  . Family history of colon cancer 12/02/2013   . Dyshidrotic eczema 11/17/2013  . Diastasis recti 04/08/2013  . Facial nerve paresis 07/31/2012  . Blepharospasm 05/08/2012  . 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 Obesity 03/28/2012  . Hemoptysis 12/22/2011  . Neuropathic postherpetic trigeminal neuralgia 12/12/2011  . Allergic rhinitis, mild 10/12/2011  . Disequilibrium syndrome 09/27/2011  . Chronic bronchitis ([facility]CM[facility]ain09/24/2012  . Essential hypertension 05/24/2011  . Type 2 diabetes mellitus without complications ([id number]. Pulmonary nodules 05/05/2011   MaJanna ArchPT, DPT   07/07/2019, 12:57 PM  [facility] [facility] [place] [id number]: [phone] Fax:  [phone]: Susan MarquartRN: [id number] of Birth: 6/October 05, 1950

## 2019-07-07 NOTE — Therapy (Signed)
[place] MAIN [facility] SERVICES [address] [place], [place], [address] Phone: [phone]   Fax:  [phone]  Occupational Therapy Treatment  Patient Details  Name: Susan Callahan MRN: [id number] Date of Birth: 1951/05/11 Referring Provider (OT): Humberto Leep, MD   Encounter Date: 07/07/2019  OT End of Session - 07/07/19 1321    Visit Number  4    Number of Visits  36    Date for OT Re-Evaluation  09/18/19    Authorization Type  new POC requesting 36 additional visits 06/18/19    OT Start Time  1105    OT Stop Time  1205    OT Time Calculation (min)  60 min    Activity Tolerance  Patient tolerated treatment well;No increased pain    Behavior During Therapy  WFL for tasks assessed/performed       Past Medical History:  Diagnosis Date  . COPD (chronic obstructive pulmonary disease) ([place])   . Depression 1974  . Diabetes mellitus without complication ([place])   . Hypertension   . Migraine   . 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 LUMBAR PERITONEAL SHUNT    . TYMPANOSTOMY TUBE PLACEMENT      There were no vitals filed for this visit.  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 01/30/19 partial mastectomy. Margins clear. 0/2 LN  negative for  disease. actinic keritosis,DDD, DM, obesity, thyroid nodules, pulmonary nodules, chronic back pain, HTN, COPD, hx depression    Limitations  pain and swelling in R axilla and arm, chronic back pain, muscle weakness, impaired balance, decreased RUE AROM, abnormal gait, difficulty walking, impaired functional mobility and transfers,    Repetition   Increases Symptoms    Special Tests  -Stemmer at MPs bilaterally    Patient Stated Goals  relduce pain and improve movement  in my R arm so I can do things I need to do. reduce risk of lymphedema    Currently in Pain?  Yes   chromnic pain persists.  L axillary cording pain persists with all movement requiring elbow and wrist extension with shoulder flexion and/ or abduction to end ranges P! not rated numerically   Pain Location  Axilla    Pain Orientation  Left    Pain Descriptors / Indicators  Tender;Discomfort;Tightness;Jabbing;Penetrating;Sharp;Grimacing;Nagging;Radiating    Pain Type  Chronic pain    Pain Onset  More than a month ago    Pain Onset  More than a month ago                           OT Education - 07/07/19 1320    Education Details  Cont Pt edu for LE self care throughout session. Good return for donning armsleeve using assistive devices.    Person(s) Educated  Patient    Methods  Demonstration;Explanation;Tactile cues;Verbal cues;Handout    Comprehension  Verbalized understanding;Returned demonstration          OT Long Term Goals - 06/19/19 1400      OT LONG TERM GOAL #5   Status  Unable to assess   garments  have not been delivered for fitting yet     OT LONG TERM GOAL #6   Baseline  Max A    Time  12    Period  Weeks    Status  Partially Met    Target Date  08/31/19            Plan - 07/07/19 1321    Clinical Impression Statement  L AWS continues to limit LUE wrist and elbow extension with shoulder flexion and/ or abduction at end ranges. Pt tolerated MR and soft tissue manipulation in effort to relase tightnes and or break cord  altogether. Sclerotic cording palpable in axilla. Alternated MR and MLD throughout session. Cont as per POC.    OT Occupational Profile and History  Comprehensive Assessment- Review of records and extensive additional review of physical, cognitive, psychosocial history related to current functional  performance    Occupational performance deficits (Please refer to evaluation for details):  ADL's;IADL's;Social Participation;Leisure;Rest and Sleep;Work    Marketing executive / Function / Physical Skills  ADL;Decreased knowledge of precautions;ROM;UE functional use;Decreased knowledge of use of DME;Edema;Skin integrity;Pain;IADL    Rehab Potential  Good    Clinical Decision Making  Several treatment options, min-mod task modification necessary    Comorbidities Affecting Occupational Performance:  Presence of comorbidities impacting occupational performance    Comorbidities impacting occupational performance description:  see SUBJUECTIVE    Modification or Assistance to Complete Evaluation   Min-Moderate modification of tasks or assist with assess necessary to complete eval    OT Frequency  2x / week    OT Duration  12 weeks    OT Treatment/Interventions  Self-care/ADL training;Therapeutic exercise;Manual lymph drainage;Patient/family education;Therapeutic activities;DME and/or AE instruction;Manual Therapy;Other (comment)   consider fitting with prophylactic cmopression arm sleeve   Plan  BUE AROM ther ex to reduce axillary cording- 2 sets of 10 2-3 x daily    Consulted and Agree with Plan of Care  Patient       Patient will benefit from skilled therapeutic intervention in order to improve the following deficits and impairments:   Body Structure / Function / Physical Skills: ADL, Decreased knowledge of precautions, ROM, UE functional use, Decreased knowledge of use of DME, Edema, Skin integrity, Pain, IADL       Visit Diagnosis: Lymphedema, not elsewhere classified    Problem List Patient Active Problem List   Diagnosis Date Noted  . Edema 04/16/2019  . Postoperative wound infection 03/13/2019  . Seroma of breast 03/11/2019  . Age-related osteoporosis without current pathological fracture 02/27/2019  . Malignant neoplasm of central portion of right breast in female, estrogen receptor  positive ([place]) 02/12/2019  . Genetic testing 01/31/2019  . Malignant neoplasm of upper-inner quadrant of right breast in female, estrogen receptor positive ([place]) 01/15/2019  . Long term current use of opiate analgesic 12/02/2018  . Tympanic membrane central perforation, left 11/04/2018  . DDD (degenerative disc disease), lumbosacral 10/10/2018  . Abnormal MRI, lumbar spine (08/29/2018) 10/10/2018  . 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 Lumbar nerve root impingement (Bilateral: L4 & L5) (L>R: L5) 10/10/2018  . Lumbar Spinal instability of L4-5 10/10/2018  . Lumbar facet hypertrophy 10/10/2018  . COPD (chronic obstructive pulmonary disease) with emphysema ([place]) 08/28/2018  . Elevated rheumatoid factor 08/13/2018  . Elevated C-reactive protein (CRP) 08/12/2018  . Elevated sed rate 08/12/2018  . Lumbar facet syndrome (Bilateral) 08/12/2018  . Back pain with left-sided radiculopathy 08/12/2018  . DDD (degenerative disc  disease), lumbar 08/12/2018  . Chronic lower extremity pain (Primary Area of Pain) (Bilateral) (L>R) 07/23/2018  . Chronic low back pain (Secondary Area of Pain) (Bilateral) (L>R) w/ sciatica (Bilateral) 07/23/2018  . Chronic pain syndrome 07/23/2018  . Pharmacologic therapy 07/23/2018  . Disorder of skeletal system 07/23/2018  . Problems influencing health status 07/23/2018  . Chronic sacroiliac joint pain [facility] Pain) (Bilateral) (L>R) 07/23/2018  . Status post lumboperitoneal shunt placement 02/26/2018  . 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 Tympanic membrane perforation 05/30/2016  . PMB (postmenopausal bleeding) 04/26/2016  . Rash 12/27/2015  . 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 Plantar fasciitis of foot (Left) 05/26/2015  . Spondylosis of lumbar region without myelopathy or radiculopathy 05/26/2015  . Lumbar foraminal stenosis  (L3-4, L4-5, L5-S1) (L>R) 05/26/2015  . Spinal stenosis of lumbar region with neurogenic claudication 05/26/2015  . Encounter for screening mammogram for malignant neoplasm of breast 12/02/2014  . Goiter 12/02/2014  . Chronic idiopathic gout of foot (Right) 07/06/2014  . Biceps tendonitis (Right) 05/19/2014  . Shoulder pain (Right) 05/19/2014  . Facial weakness 05/05/2014  . Post herpetic neuralgia 05/05/2014  . Chronic dysfunction of left eustachian tube 03/24/2014  . Osteopenia 03/13/2014  . Lumbar Grade 1 Anterolisthesis of L3/4 (57m) & L4/5 (4-155m(w/ Dynamic instability) 03/13/2014  . Family history of colon cancer 12/02/2013  . Dyshidrotic eczema 11/17/2013  . Diastasis recti 04/08/2013  . Facial nerve paresis 07/31/2012  . Blepharospasm 05/08/2012  . 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 Obesity 03/28/2012  . Hemoptysis 12/22/2011  . Neuropathic postherpetic trigeminal neuralgia 12/12/2011  . Allergic rhinitis, mild 10/12/2011  . Disequilibrium syndrome 09/27/2011  . Chronic bronchitis ([place]09/24/2012  . Essential hypertension 05/24/2011  . Type 2 diabetes mellitus without complications ([place]0926/[id number]. Pulmonary nodules 05/05/2011    ThAndrey SpearmanMS, OTR/L, CLDoctors United Surgery Center0/26/20 1:28 PM  [facility] REVal[facility] LPERVICES [address]dClifton Gardens[place]hone: [phone] Fax:  [phone]: Susan DepassRN: [id number] of Birth: 6/03-27-1952

## 2019-07-09 ENCOUNTER — Other Ambulatory Visit: Payer: Self-pay

## 2019-07-09 ENCOUNTER — Ambulatory Visit: Payer: Medicare Other | Admitting: Occupational Therapy

## 2019-07-09 ENCOUNTER — Ambulatory Visit: Payer: Medicare Other

## 2019-07-09 DIAGNOSIS — G8929 Other chronic pain: Secondary | ICD-10-CM

## 2019-07-09 DIAGNOSIS — R2689 Other abnormalities of gait and mobility: Secondary | ICD-10-CM

## 2019-07-09 DIAGNOSIS — I972 Postmastectomy lymphedema syndrome: Secondary | ICD-10-CM

## 2019-07-09 DIAGNOSIS — R2681 Unsteadiness on feet: Secondary | ICD-10-CM

## 2019-07-09 DIAGNOSIS — M545 Low back pain, unspecified: Secondary | ICD-10-CM

## 2019-07-09 DIAGNOSIS — M6281 Muscle weakness (generalized): Secondary | ICD-10-CM

## 2019-07-09 NOTE — Therapy (Signed)
[place] MAIN [facility] SERVICES [address] [place], [place], [address] Phone: [phone]   Fax:  [phone]  Physical Therapy Treatment  Patient Details  Name: Susan Callahan MRN: [id number] Date of Birth: 1950/10/06 No data recorded  Encounter Date: 07/09/2019  PT End of Session - 07/09/19 1541    Visit Number  65    Number of Visits  72    Date for PT Re-Evaluation  08/04/19    Authorization Type  5/10 Pn 10/6    PT Start Time  1515    PT Stop Time  1559    PT Time Calculation (min)  44 min    Equipment Utilized During Treatment  Gait belt    Activity Tolerance  Patient tolerated treatment well;Patient limited by fatigue    Behavior During Therapy  WFL for tasks assessed/performed       Past Medical History:  Diagnosis Date  . COPD (chronic obstructive pulmonary disease) ([place])   . Depression 1974  . Diabetes mellitus without complication ([place])   . Hypertension   . Migraine   . 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 LUMBAR PERITONEAL SHUNT    . TYMPANOSTOMY TUBE PLACEMENT      There were no vitals filed for this visit.  Subjective Assessment - 07/09/19 1537    Subjective  Patient went to gym today and did 20 minutes on Nustep and 8 minutes on treadmill. Compliant with HEP. No falls or LOB since last session.    Pertinent History  Patient reports balance issues began in 2008. 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  Balance deficit has been constant since 2008, some days are worse than others but is always present to patient. Does not notice a directionality of loss of balance. Patient reports her balance is affecting her standing, walking on uneven surfaces such as outside, trying to walk with dim light, showering and ADLs. Slight visual deficit of R lower quadrant. 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 Has nystagmus when looking to the R.   Back pain began in 2007 and has gradually worsened to nonstop pain. Medication helps briefly. Patient is challenged with standing with knees extended due to back pain.    Limitations  Lifting;Standing;Walking;House hold activities    How long can you stand comfortably?  pain begins immediately     How long can you walk comfortably?  pain begins immediately     Patient Stated Goals  walk better, have less pain, be able to walk in the grocery store with head turns to scan shelves    Currently in Pain?  No/denies        supine:  Sciatic nerve glide with SLR 20x each LE  Figure 4 stretch 30 second holds each LE   seated Figure 4 stretch 30 second holds each side for piriformis prostretch df/pf 20x , 2 trials  Adduction ball squeeze 10x 3 second holds LAQ with ball squeeze between feet 10x, challenging coordination  Standing with CGA  airex pad  -static stand 3x 30 second, cueing for breathing -single heel raise with SUE support 15x each LE  -SUE support marching 15x each LE    Tandem stance with SUE support 30 seconds each UE   education on breathing techniques for control and stabilization.     Pt educated throughout session about proper posture and technique with exercises. Improved exercise technique,  movement at target joints, use of target muscles after min to mod verbal, visual, tactile cues                  PT Education - 07/09/19 1538    Education Details  exercise technique, body mechanics    Person(s) Educated  Patient    Methods  Explanation;Demonstration;Tactile cues;Verbal cues    Comprehension  Verbalized understanding;Returned demonstration;Verbal cues required;Tactile cues required       PT Short Term Goals - 07/07/19 1217      PT SHORT TERM GOAL #1   Title  Patient will be independent with completion of HEP to improve ability to complete functional tasks and functional ADLs.    Baseline  10/23: will give HEP next session 11/19: HEP  compliance    Time  2    Period  Weeks    Status  Achieved      PT SHORT TERM GOAL #2   Title  Patient will report pain score less than 4/10 on VAS to demonstrate reduction of pain levels with functional tasks.     Baseline  10/23: 6/10 11/19: 5/10 12/12: 9/10 1/10: 7/10  2/11: 3/10     Time  2    Period  Weeks    Status  Achieved        PT Long Term Goals - 07/07/19 1108      PT LONG TERM GOAL #1   Title  Patient will report pain score of <3/10 on VAS for decreased pain levels and ease with functional activities.    Baseline  10/23: 6/10 11/19: 5/10 12/12 9/10 1/10: 7/10 2/11: 3/10 3/9: 6/10  5/26: 8/10 03/17/19: 7/10 8/3: 5/10 8/25: 4/10 9/21: 4/10 10/26: 4/10    Time  4    Period  Weeks    Status  Partially Met    Target Date  08/04/19      PT LONG TERM GOAL #2   Title  Patient will be able to ambulate on treadmill for 73mnutes without increase in back/leg pain demonstrating improved community ambulation and treadmill walking at pulmonary rehab    Baseline  10/23: 064mutes increases pain 11/19: [id number] 12/12: has not had the chance to perform 1/10: was able to do 5 minutes without pain 2/11: able to do 5 minutes then had to bend over 3/9: 5 minutes 30 seconds at 2.0 mphs 5/26: had to walk 10 minutes at cancer center with extreme pain but was able to perform 7/6: walks 10 minutes with increase of pain by 2 points 8/3: able to perform with bent over posture 9/21: able to perform 7 minutes with intervals of 1-2% incline    Time  4    Period  Weeks    Status  Achieved      PT LONG TERM GOAL #3   Title  Patient will increase dynamic gait index score to >19/24 as to demonstrate reduced fall risk and improved dynamic gait balance for better safety with community/home ambulation.    Baseline  10/1: 11/24 10/26: 18/24    Time  4    Period  Weeks    Status  Partially Met    Target Date  08/04/19      PT LONG TERM GOAL #4   Title  Patient will score 4+/5 on BLE MMT to demonstrate  improved glute strength and ability to carry groceries with increased ease up/down stairs at home.    Baseline  10/23: 4-/5 grossly 11/19: 4/5 grossly;  hamstrings 4-/5 12/12: 4/5 gross 1/10: 4/5 gross 2/11: gross 4+/5 with hamstrings and abuductors 4-/5 3/9: gross 4+/5 hamstrings and abductors 4-/5 5/27: grossly 3+/5 with pain with LLE movements 7/6: grossly 4-/5 with pain with LLE movement 8/3: deferred due to swelling/pain 8/25: grossly 4/5 without pain 9/21: grossly 4/5 with hamstrings not tested due to spasm    Time  4    Period  Weeks    Status  Partially Met    Target Date  08/04/19      PT LONG TERM GOAL #5   Title  Patient will increase six minute walk test distance to >1000 with minimal increase of pain for progression to community ambulator and improve gait ability    Baseline  10/1:580 ft stopped at 3: 58 due to excessive pain 10/26: 950 ft    Time  4    Period  Weeks    Status  Partially Met    Target Date  08/04/19      PT LONG TERM GOAL #6   Title  Patient will be able to make and/or strip the bed without pain and without needing rest breaks to perform iADLs independently.     Baseline  2/11: pain limits ability to perform, ~4 breaks required 3/9: takes her time, 3 breaks 5/27: very painful still 7/6: requires only one rest break 8/3: able to perform with +2 pain increase 8/25: no rest breaks required, increased pain 4 points 9/21: able to perform with +1-2 symptoms 10/26: able to perform with only occasional symptoms.    Time  4    Period  Weeks    Status  Achieved      PT LONG TERM GOAL #7   Title  Patient will have decreased modified oswestry score <20% for improved ability to complete household tasks with less pain levels.     Baseline  2/11: 30%  3/9: 30% 5/27: 63% 7/6: 38% 8/3: 42% 8/25: 36% 9/21: 30% 10/26: 26%    Time  4    Period  Weeks    Status  Partially Met    Target Date  08/04/19      PT LONG TERM GOAL #8   Title  Patient will increase Berg Balance score by  > 6 points (50/56) to demonstrate decreased fall risk during functional activities.    Baseline  10/1: 44/56 10/26: 49/56    Time  4    Period  Weeks    Status  Partially Met    Target Date  08/04/19            Plan - 07/09/19 1746    Clinical Impression Statement  Patient is challenged with stability on unstable surfaces requiring use of finger tip support for static positioning and single hand to bilateral hand support for dynamic tasks. Patient has limited ankle righting reactions that fatigues quickly, however improves with cueing for breathing. Patient will benefit from skilled physical therapy to increase balance, improve strength, decrease pain, and improve functional capacity of mobility for improved quality of life    Personal Factors and Comorbidities  Age;Comorbidity 3+;Fitness;Finances;Past/Current Experience;Social Background;Time since onset of injury/illness/exacerbation    Comorbidities  COPD, Ramsay Hunt Syndrome, HTN, DM, lymphedema, breast cancer    Examination-Activity Limitations  Bathing;Bend;Carry;Dressing;Hygiene/Grooming;Stairs;Squat;Reach Overhead;Locomotion Level;Lift;Stand;Transfers    Examination-Participation Restrictions  Cleaning;Community Activity;Laundry;Volunteer;Shop;Personal Finances;Yard Work    Journalist, newspaper    Rehab Potential  Fair    PT Frequency  2x / week  PT Duration  4 weeks    PT Treatment/Interventions  ADLs/Self Care Home Management;Cryotherapy;Moist Heat;Traction;Iontophoresis 43m/ml Dexamethasone;Ultrasound;Gait training;Stair training;Functional mobility training;Neuromuscular re-education;Balance training;Therapeutic exercise;Therapeutic activities;Patient/family education;Manual techniques;Energy conservation;Aquatic Therapy;Electrical Stimulation;Passive range of motion;Dry needling;Taping;Vestibular;Canalith Repostioning    PT Next Visit Plan  VOR, walking with head turns    PT Home  Exercise Plan  VOR    Consulted and Agree with Plan of Care  Patient       Patient will benefit from skilled therapeutic intervention in order to improve the following deficits and impairments:  Abnormal gait, Cardiopulmonary status limiting activity, Decreased activity tolerance, Decreased balance, Decreased endurance, Decreased range of motion, Difficulty walking, Decreased strength, Hypomobility, Decreased mobility, Impaired perceived functional ability, Impaired flexibility, Postural dysfunction, Obesity, Improper body mechanics, Pain, Dizziness, Increased edema  Visit Diagnosis: Chronic low back pain, unspecified back pain laterality, unspecified whether sciatica present  Muscle weakness (generalized)  Other abnormalities of gait and mobility  Unsteadiness on feet     Problem List Patient Active Problem List   Diagnosis Date Noted  . Edema 04/16/2019  . Postoperative wound infection 03/13/2019  . Seroma of breast 03/11/2019  . Age-related osteoporosis without current pathological fracture 02/27/2019  . Malignant neoplasm of central portion of right breast in female, estrogen receptor positive ([place] 02/12/2019  . Genetic testing 01/31/2019  . Malignant neoplasm of upper-inner quadrant of right breast in female, estrogen receptor positive ([place] 01/15/2019  . Long term current use of opiate analgesic 12/02/2018  . Tympanic membrane central perforation, left 11/04/2018  . DDD (degenerative disc disease), lumbosacral 10/10/2018  . Abnormal MRI, lumbar spine (08/29/2018) 10/10/2018  . 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 Lumbar nerve root impingement (Bilateral: L4 & L5) (L>R: L5) 10/10/2018  . Lumbar Spinal instability of L4-5 10/10/2018  . Lumbar facet hypertrophy 10/10/2018  . COPD (chronic obstructive pulmonary disease) with emphysema ([place] [place] 08/28/2018  . Elevated rheumatoid factor 08/13/2018  . Elevated C-reactive protein (CRP)  08/12/2018  . Elevated sed rate 08/12/2018  . Lumbar facet syndrome (Bilateral) 08/12/2018  . Back pain with left-sided radiculopathy 08/12/2018  . DDD (degenerative disc disease), lumbar 08/12/2018  . Chronic lower extremity pain (Primary Area of Pain) (Bilateral) (L>R) 07/23/2018  . Chronic low back pain (Secondary Area of Pain) (Bilateral) (L>R) w/ sciatica (Bilateral) 07/23/2018  . Chronic pain syndrome 07/23/2018  . Pharmacologic therapy 07/23/2018  . Disorder of skeletal system 07/23/2018  . Problems influencing health status 07/23/2018  . Chronic sacroiliac joint pain [facility] (Bilateral) (L>R) 07/23/2018  . Status post lumboperitoneal shunt placement 02/26/2018  . 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 Tympanic membrane perforation 05/30/2016  . PMB (postmenopausal bleeding) 04/26/2016  . Rash 12/27/2015  . 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 Plantar fasciitis of foot (Left) 05/26/2015  . Spondylosis of lumbar region without myelopathy or radiculopathy 05/26/2015  . Lumbar foraminal stenosis (L3-4, L4-5, L5-S1) (L>R) 05/26/2015  . Spinal stenosis of lumbar region with neurogenic claudication 05/26/2015  . Encounter for screening mammogram for malignant neoplasm of breast 12/02/2014  . Goiter 12/02/2014  . Chronic idiopathic gout of foot (Right) 07/06/2014  . Biceps tendonitis (Right) 05/19/2014  . Shoulder pain (Right) 05/19/2014  . Facial weakness 05/05/2014  . Post herpetic neuralgia 05/05/2014  . Chronic dysfunction of left eustachian tube 03/24/2014  . Osteopenia 03/13/2014  . Lumbar Grade 1 Anterolisthesis of L3/4 (270m & L4/5 (4-1055mw/ Dynamic instability) 03/13/2014  .  Family history of colon cancer 12/02/2013  . Dyshidrotic eczema 11/17/2013  . Diastasis recti 04/08/2013  . Facial nerve paresis 07/31/2012  . Blepharospasm 05/08/2012  . Conductive hearing loss of left ear  with unrestricted hearing of right ear 04/08/2012  . Obesity 03/28/2012  . Hemoptysis 12/22/2011  . Neuropathic postherpetic trigeminal neuralgia 12/12/2011  . Allergic rhinitis, mild 10/12/2011  . Disequilibrium syndrome 09/27/2011  . Chronic bronchitis ([place]) 06/05/2011  . Essential hypertension 05/24/2011  . Type 2 diabetes mellitus without complications ([place]) 54/56/1327  . Pulmonary nodules 05/05/2011   Janna Arch, PT, DPT   07/09/2019, 5:47 PM  [place] MAIN [facility] SERVICES [address] [place], [place], [address] Phone: [phone]   Fax:  [phone]  Name: Shevon Sian MRN: [id number] Date of Birth: 1951-02-14

## 2019-07-09 NOTE — Therapy (Signed)
[place] MAIN [facility] SERVICES [address] [place], [place], [address] Phone: [phone]   Fax:  [phone]  Occupational Therapy Treatment  Patient Details  Name: Susan Callahan MRN: [id number] Date of Birth: 1950-09-14 Referring Provider (OT): Humberto Leep, MD   Encounter Date: 07/09/2019  OT End of Session - 07/09/19 1411    Visit Number  41    Number of Visits  36    Date for OT Re-Evaluation  09/18/19    Authorization Type  new POC requesting 36 additional visits 06/18/19    OT Start Time  0310    OT Stop Time  0410    OT Time Calculation (min)  60 min    Activity Tolerance  Patient tolerated treatment well;No increased pain    Behavior During Therapy  WFL for tasks assessed/performed       Past Medical History:  Diagnosis Date  . COPD (chronic obstructive pulmonary disease) ([place])   . Depression 1974  . Diabetes mellitus without complication ([place])   . Hypertension   . Migraine   . 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 LUMBAR PERITONEAL SHUNT    . TYMPANOSTOMY TUBE PLACEMENT      There were no vitals filed for this visit.  Subjective Assessment - 07/09/19 1413    Subjective   Pt presents for OT visit 41/72 (36) to address RUE/ RUQ post-mastectomy lymphedema and AWS. 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 01/30/19 partial mastectomy. Margins clear. 0/2 LN  negative for  disease. actinic keritosis,DDD, DM, obesity, thyroid nodules, pulmonary nodules, chronic back pain, HTN, COPD, hx depression    Limitations  pain and swelling in R axilla and arm, chronic back pain, muscle weakness, impaired balance, decreased RUE AROM, abnormal gait, difficulty walking, impaired functional mobility and transfers,    Repetition  Increases Symptoms     Special Tests  -Stemmer at MPs bilaterally    Patient Stated Goals  relduce pain and improve movement  in my R arm so I can do things I need to do. reduce risk of lymphedema    Pain Onset  More than a month ago    Pain Onset  More than a month ago                   OT Treatments/Exercises (OP) - 07/09/19 0001      ADLs   ADL Education Given  Yes      Manual Therapy   Manual Therapy  Edema management    Soft tissue mobilization  fibrosis techniques to dorsal hand    Myofascial Release  for cord release R axilla    Manual Lymphatic Drainage (MLD)  MLD to RUE/RUQ as established using cntalateral AAA    Compression Bandaging  custom compression armsleeve OVER glove today in effort to ease transition at wrist. Pt required mod A fand assistive device for donning             OT Education - 07/09/19 1516    Education Details  Cont Pt edu for LE self care throughout session. Good return for donning armsleeve using assistive devices.    Person(s) Educated  Patient    Methods  Demonstration;Explanation;Tactile cues;Verbal cues;Handout    Comprehension  Verbalized understanding;Returned demonstration  OT Long Term Goals - 06/19/19 1400      OT LONG TERM GOAL #5   Status  Unable to assess   garments have not been delivered for fitting yet     OT LONG TERM GOAL #6   Baseline  Max A    Time  12    Period  Weeks    Status  Partially Met    Target Date  08/31/19            Plan - 07/09/19 1517    Clinical Impression Statement  Emphasis of manual therapy on myofacial release and soft tissue manipulation  for L axillary cord release . Webbing more pronounced at antecubuital fossa today than previously. No cords  released, but Pt reports cord stretched effectively throughout session. Hand swelling well controoled, but not yet released. Cont as per POC.    OT Occupational Profile and History  Comprehensive Assessment- Review of records and extensive  additional review of physical, cognitive, psychosocial history related to current functional performance    Occupational performance deficits (Please refer to evaluation for details):  ADL's;IADL's;Social Participation;Leisure;Rest and Sleep;Work    Marketing executive / Function / Physical Skills  ADL;Decreased knowledge of precautions;ROM;UE functional use;Decreased knowledge of use of DME;Edema;Skin integrity;Pain;IADL    Rehab Potential  Good    Clinical Decision Making  Several treatment options, min-mod task modification necessary    Comorbidities Affecting Occupational Performance:  Presence of comorbidities impacting occupational performance    Comorbidities impacting occupational performance description:  see SUBJUECTIVE    Modification or Assistance to Complete Evaluation   Min-Moderate modification of tasks or assist with assess necessary to complete eval    OT Frequency  2x / week    OT Duration  12 weeks    OT Treatment/Interventions  Self-care/ADL training;Therapeutic exercise;Manual lymph drainage;Patient/family education;Therapeutic activities;DME and/or AE instruction;Manual Therapy;Other (comment)   consider fitting with prophylactic cmopression arm sleeve   Plan  BUE AROM ther ex to reduce axillary cording- 2 sets of 10 2-3 x daily    Consulted and Agree with Plan of Care  Patient       Patient will benefit from skilled therapeutic intervention in order to improve the following deficits and impairments:   Body Structure / Function / Physical Skills: ADL, Decreased knowledge of precautions, ROM, UE functional use, Decreased knowledge of use of DME, Edema, Skin integrity, Pain, IADL       Visit Diagnosis: Post-mastectomy lymphedema syndrome    Problem List Patient Active Problem List   Diagnosis Date Noted  . Edema 04/16/2019  . Postoperative wound infection 03/13/2019  . Seroma of breast 03/11/2019  . Age-related osteoporosis without current pathological fracture  02/27/2019  . Malignant neoplasm of central portion of right breast in female, estrogen receptor positive ([place]) 02/12/2019  . Genetic testing 01/31/2019  . Malignant neoplasm of upper-inner quadrant of right breast in female, estrogen receptor positive ([place]) 01/15/2019  . Long term current use of opiate analgesic 12/02/2018  . Tympanic membrane central perforation, left 11/04/2018  . DDD (degenerative disc disease), lumbosacral 10/10/2018  . Abnormal MRI, lumbar spine (08/29/2018) 10/10/2018  . 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 Lumbar nerve root impingement (Bilateral: L4 & L5) (L>R: L5) 10/10/2018  . Lumbar Spinal instability of L4-5 10/10/2018  . Lumbar facet hypertrophy 10/10/2018  . COPD (chronic obstructive pulmonary disease) with emphysema ([place]) 08/28/2018  . Elevated rheumatoid factor 08/13/2018  . Elevated C-reactive protein (CRP) 08/12/2018  .  Elevated sed rate 08/12/2018  . Lumbar facet syndrome (Bilateral) 08/12/2018  . Back pain with left-sided radiculopathy 08/12/2018  . DDD (degenerative disc disease), lumbar 08/12/2018  . Chronic lower extremity pain (Primary Area of Pain) (Bilateral) (L>R) 07/23/2018  . Chronic low back pain (Secondary Area of Pain) (Bilateral) (L>R) w/ sciatica (Bilateral) 07/23/2018  . Chronic pain syndrome 07/23/2018  . Pharmacologic therapy 07/23/2018  . Disorder of skeletal system 07/23/2018  . Problems influencing health status 07/23/2018  . Chronic sacroiliac joint pain [facility] Area of Pain) (Bilateral) (L>R) 07/23/2018  . Status post lumboperitoneal shunt placement 02/26/2018  . 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 Tympanic membrane perforation 05/30/2016  . PMB (postmenopausal bleeding) 04/26/2016  . Rash 12/27/2015  . 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 Plantar fasciitis of foot (Left) 05/26/2015  . Spondylosis  of lumbar region without myelopathy or radiculopathy 05/26/2015  . Lumbar foraminal stenosis (L3-4, L4-5, L5-S1) (L>R) 05/26/2015  . Spinal stenosis of lumbar region with neurogenic claudication 05/26/2015  . Encounter for screening mammogram for malignant neoplasm of breast 12/02/2014  . Goiter 12/02/2014  . Chronic idiopathic gout of foot (Right) 07/06/2014  . Biceps tendonitis (Right) 05/19/2014  . Shoulder pain (Right) 05/19/2014  . Facial weakness 05/05/2014  . Post herpetic neuralgia 05/05/2014  . Chronic dysfunction of left eustachian tube 03/24/2014  . Osteopenia 03/13/2014  . Lumbar Grade 1 Anterolisthesis of L3/4 (66m) & L4/5 (4-122m(w/ Dynamic instability) 03/13/2014  . Family history of colon cancer 12/02/2013  . Dyshidrotic eczema 11/17/2013  . Diastasis recti 04/08/2013  . Facial nerve paresis 07/31/2012  . Blepharospasm 05/08/2012  . 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 Obesity 03/28/2012  . Hemoptysis 12/22/2011  . Neuropathic postherpetic trigeminal neuralgia 12/12/2011  . Allergic rhinitis, mild 10/12/2011  . Disequilibrium syndrome 09/27/2011  . Chronic bronchitis (HCConvent09/24/2012  . Essential hypertension 05/24/2011  . Type 2 diabetes mellitus without complications (HCS[place]us[place]ille0950/11/7046. Pulmonary nodules 05/05/2011    ThAndrey SpearmanMS, OTR/L, CLTennova Healthcare North [facility]0/28/20 3:20 PM  CoOrtonvilleAIN RESelect Specialty Hospital-EvansvilleERVICES [address]hone: [phone] Fax:  [phone]: Susan PeacheyRN: [id number] of Birth: 02/1951/10/24

## 2019-07-14 ENCOUNTER — Ambulatory Visit: Payer: Medicare Other | Attending: Rehabilitation | Admitting: Occupational Therapy

## 2019-07-14 ENCOUNTER — Other Ambulatory Visit: Payer: Self-pay

## 2019-07-14 DIAGNOSIS — M6281 Muscle weakness (generalized): Secondary | ICD-10-CM | POA: Diagnosis present

## 2019-07-14 DIAGNOSIS — I972 Postmastectomy lymphedema syndrome: Secondary | ICD-10-CM | POA: Insufficient documentation

## 2019-07-14 DIAGNOSIS — R2689 Other abnormalities of gait and mobility: Secondary | ICD-10-CM | POA: Diagnosis present

## 2019-07-14 DIAGNOSIS — G8929 Other chronic pain: Secondary | ICD-10-CM | POA: Insufficient documentation

## 2019-07-14 DIAGNOSIS — I890 Lymphedema, not elsewhere classified: Secondary | ICD-10-CM | POA: Diagnosis present

## 2019-07-14 DIAGNOSIS — R2681 Unsteadiness on feet: Secondary | ICD-10-CM | POA: Insufficient documentation

## 2019-07-14 DIAGNOSIS — M545 Low back pain: Secondary | ICD-10-CM | POA: Diagnosis present

## 2019-07-14 NOTE — Therapy (Signed)
[place] MAIN [facility] SERVICES [address] [place], [place], [address] Phone: [phone]   Fax:  [phone]  Occupational Therapy Treatment  Patient Details  Name: Susan Callahan MRN: [id number] Date of Birth: 1950-10-27 Referring Provider (OT): Humberto Leep, MD   Encounter Date: 07/14/2019  OT End of Session - 07/14/19 1700    Visit Number  42    Number of Visits  36    Date for OT Re-Evaluation  09/18/19    Authorization Type  new POC requesting 36 additional visits 06/18/19    OT Start Time  0205    OT Stop Time  0305    OT Time Calculation (min)  60 min    Activity Tolerance  Patient tolerated treatment well;No increased pain    Behavior During Therapy  WFL for tasks assessed/performed       Past Medical History:  Diagnosis Date  . COPD (chronic obstructive pulmonary disease) ([place])   . Depression 1974  . Diabetes mellitus without complication ([place])   . Hypertension   . Migraine   . 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 LUMBAR PERITONEAL SHUNT    . TYMPANOSTOMY TUBE PLACEMENT      There were no vitals filed for this visit.  Subjective Assessment - 07/14/19 1657    Subjective   Pt presents for OT visit 42/72 (62) to address RUE/ RUQ post-mastectomy lymphedema and AWS. Pt brings remade arm sleeve and glove to session for fitting. Pt denies new concerns/ complanits. She reports reduced pain / discomfort in R axilla and arm after last session and denies increased pain and/ or discomfort after agressive myofascial release,.    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 01/30/19 partial mastectomy. Margins clear. 0/2 LN  negative for  disease. actinic keritosis,DDD, DM, obesity, thyroid nodules, pulmonary nodules, chronic back pain, HTN, COPD, hx depression    Limitations  pain and swelling in R axilla and arm, chronic back pain,  muscle weakness, impaired balance, decreased RUE AROM, abnormal gait, difficulty walking, impaired functional mobility and transfers,    Repetition  Increases Symptoms    Special Tests  -Stemmer at MPs bilaterally    Patient Stated Goals  relduce pain and improve movement  in my R arm so I can do things I need to do. reduce risk of lymphedema    Pain Onset  More than a month ago    Pain Onset  More than a month ago                   OT Treatments/Exercises (OP) - 07/14/19 0001      ADLs   ADL Education Given  Yes      Manual Therapy   Manual Therapy  Edema management;Myofascial release;Manual Lymphatic Drainage (MLD);Compression Bandaging    Myofascial Release  for cord release R axilla    Manual Lymphatic Drainage (MLD)  MLD to RUE/RUQ as established using cntalateral AAA    Compression Bandaging  remade compression fitting             OT Education - 07/14/19 1659    Education Details  Cont Pt edu for LE self care throughout session. Good return for donning armsleeve using assistive devices.    Person(s) Educated  Patient    Methods  Explanation;Handout;Demonstration    Comprehension  Verbalized understanding;Returned demonstration  OT Long Term Goals - 06/19/19 1400      OT LONG TERM GOAL #5   Status  Unable to assess   garments have not been delivered for fitting yet     OT LONG TERM GOAL #6   Baseline  Max A    Time  12    Period  Weeks    Status  Partially Met    Target Date  08/31/19            Plan - 07/14/19 1700    Clinical Impression Statement  Majority of session spent on manual therapy on myofacial release and soft tissue manipulation  for L axillary cord release . Webbing at R lateral antecubital fossa remains visible and easy to palpate. Completed fitting for remade customn, ccl 2 , Elvarex SOFT compression sleeve and glove. Garment fitt is perfect with no bunching at volar elbow and wrist and correct finger stub length. Pt  pleased with 3/4 SB on inside  instead of on top as with initial garment. We'll monitor closely for performance and comfort. Cont as per POC.    OT Occupational Profile and History  Comprehensive Assessment- Review of records and extensive additional review of physical, cognitive, psychosocial history related to current functional performance    Occupational performance deficits (Please refer to evaluation for details):  ADL's;IADL's;Social Participation;Leisure;Rest and Sleep;Work    Marketing executive / Function / Physical Skills  ADL;Decreased knowledge of precautions;ROM;UE functional use;Decreased knowledge of use of DME;Edema;Skin integrity;Pain;IADL    Rehab Potential  Good    Clinical Decision Making  Several treatment options, min-mod task modification necessary    Comorbidities Affecting Occupational Performance:  Presence of comorbidities impacting occupational performance    Comorbidities impacting occupational performance description:  see SUBJUECTIVE    Modification or Assistance to Complete Evaluation   Min-Moderate modification of tasks or assist with assess necessary to complete eval    OT Frequency  2x / week    OT Duration  12 weeks    OT Treatment/Interventions  Self-care/ADL training;Therapeutic exercise;Manual lymph drainage;Patient/family education;Therapeutic activities;DME and/or AE instruction;Manual Therapy;Other (comment)   consider fitting with prophylactic cmopression arm sleeve   Plan  BUE AROM ther ex to reduce axillary cording- 2 sets of 10 2-3 x daily    Consulted and Agree with Plan of Care  Patient       Patient will benefit from skilled therapeutic intervention in order to improve the following deficits and impairments:   Body Structure / Function / Physical Skills: ADL, Decreased knowledge of precautions, ROM, UE functional use, Decreased knowledge of use of DME, Edema, Skin integrity, Pain, IADL       Visit Diagnosis: Postmastectomy lymphedema  syndrome    Problem List Patient Active Problem List   Diagnosis Date Noted  . Edema 04/16/2019  . Postoperative wound infection 03/13/2019  . Seroma of breast 03/11/2019  . Age-related osteoporosis without current pathological fracture 02/27/2019  . Malignant neoplasm of central portion of right breast in female, estrogen receptor positive ([place]) 02/12/2019  . Genetic testing 01/31/2019  . Malignant neoplasm of upper-inner quadrant of right breast in female, estrogen receptor positive ([place]) 01/15/2019  . Long term current use of opiate analgesic 12/02/2018  . Tympanic membrane central perforation, left 11/04/2018  . DDD (degenerative disc disease), lumbosacral 10/10/2018  . Abnormal MRI, lumbar spine (08/29/2018) 10/10/2018  . 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 Lumbar nerve root impingement (Bilateral: L4 & L5) (L>R: L5)  10/10/2018  . Lumbar Spinal instability of L4-5 10/10/2018  . Lumbar facet hypertrophy 10/10/2018  . COPD (chronic obstructive pulmonary disease) with emphysema ([place]) 08/28/2018  . Elevated rheumatoid factor 08/13/2018  . Elevated C-reactive protein (CRP) 08/12/2018  . Elevated sed rate 08/12/2018  . Lumbar facet syndrome (Bilateral) 08/12/2018  . Back pain with left-sided radiculopathy 08/12/2018  . DDD (degenerative disc disease), lumbar 08/12/2018  . Chronic lower extremity pain (Primary Area of Pain) (Bilateral) (L>R) 07/23/2018  . Chronic low back pain (Secondary Area of Pain) (Bilateral) (L>R) w/ sciatica (Bilateral) 07/23/2018  . Chronic pain syndrome 07/23/2018  . Pharmacologic therapy 07/23/2018  . Disorder of skeletal system 07/23/2018  . Problems influencing health status 07/23/2018  . Chronic sacroiliac joint pain [facility] Area of Pain) (Bilateral) (L>R) 07/23/2018  . Status post lumboperitoneal shunt placement 02/26/2018  . 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  Tympanic membrane perforation 05/30/2016  . PMB (postmenopausal bleeding) 04/26/2016  . Rash 12/27/2015  . 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 Plantar fasciitis of foot (Left) 05/26/2015  . Spondylosis of lumbar region without myelopathy or radiculopathy 05/26/2015  . Lumbar foraminal stenosis (L3-4, L4-5, L5-S1) (L>R) 05/26/2015  . Spinal stenosis of lumbar region with neurogenic claudication 05/26/2015  . Encounter for screening mammogram for malignant neoplasm of breast 12/02/2014  . Goiter 12/02/2014  . Chronic idiopathic gout of foot (Right) 07/06/2014  . Biceps tendonitis (Right) 05/19/2014  . Shoulder pain (Right) 05/19/2014  . Facial weakness 05/05/2014  . Post herpetic neuralgia 05/05/2014  . Chronic dysfunction of left eustachian tube 03/24/2014  . Osteopenia 03/13/2014  . Lumbar Grade 1 Anterolisthesis of L3/4 (46m) & L4/5 (4-126m(w/ Dynamic instability) 03/13/2014  . Family history of colon cancer 12/02/2013  . Dyshidrotic eczema 11/17/2013  . Diastasis recti 04/08/2013  . Facial nerve paresis 07/31/2012  . Blepharospasm 05/08/2012  . 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 Obesity 03/28/2012  . Hemoptysis 12/22/2011  . Neuropathic postherpetic trigeminal neuralgia 12/12/2011  . Allergic rhinitis, mild 10/12/2011  . Disequilibrium syndrome 09/27/2011  . Chronic bronchitis (HCColdstream09/24/2012  . Essential hypertension 05/24/2011  . Type 2 diabetes mellitus without complications (HCL[place]comb0993/90/3009. Pulmonary nodules 05/05/2011    ThAndrey SpearmanMS, OTR/L, CLSoutheasthealth1/02/20 5:08 PM    CoPine ValleyAIN REAscension St Mary'S HospitalERVICES [address]dFloravilleNC[place]hone: [phone] Fax:  [phone]: Susan DrumRN: [id number] of Birth: 6/26-Mar-1951

## 2019-07-16 ENCOUNTER — Ambulatory Visit: Payer: Medicare Other

## 2019-07-16 ENCOUNTER — Ambulatory Visit: Payer: Medicare Other | Admitting: Occupational Therapy

## 2019-07-16 ENCOUNTER — Other Ambulatory Visit: Payer: Self-pay

## 2019-07-16 DIAGNOSIS — I890 Lymphedema, not elsewhere classified: Secondary | ICD-10-CM

## 2019-07-16 DIAGNOSIS — I972 Postmastectomy lymphedema syndrome: Secondary | ICD-10-CM | POA: Diagnosis not present

## 2019-07-16 DIAGNOSIS — M545 Low back pain, unspecified: Secondary | ICD-10-CM

## 2019-07-16 DIAGNOSIS — M6281 Muscle weakness (generalized): Secondary | ICD-10-CM

## 2019-07-16 DIAGNOSIS — G8929 Other chronic pain: Secondary | ICD-10-CM

## 2019-07-16 DIAGNOSIS — R2689 Other abnormalities of gait and mobility: Secondary | ICD-10-CM

## 2019-07-16 DIAGNOSIS — R2681 Unsteadiness on feet: Secondary | ICD-10-CM

## 2019-07-16 NOTE — Therapy (Signed)
[place] MAIN [facility] SERVICES [address] [place], [place], [address] Phone: [phone]   Fax:  [phone]  Occupational Therapy Treatment  Patient Details  Name: Susan Callahan MRN: [id number] Date of Birth: Sep 27, 1950 Referring Provider (OT): Humberto Leep, MD   Encounter Date: 07/16/2019    Past Medical History:  Diagnosis Date  . COPD (chronic obstructive pulmonary disease) ([place])   . Depression 1974  . Diabetes mellitus without complication ([place])   . Hypertension   . Migraine   . 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 LUMBAR PERITONEAL SHUNT    . TYMPANOSTOMY TUBE PLACEMENT      There were no vitals filed for this visit.  Subjective Assessment - 07/16/19 1613    Subjective   Pt presents for OT visit 43/72 (17) to address RUE/ RUQ post-mastectomy lymphedema and AWS. Pt reports relplacement for custom   glove and arm sleeve fit well, are comfortable, and she is using daily. 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 01/30/19 partial mastectomy. Margins clear. 0/2 LN  negative for  disease. actinic keritosis,DDD, DM, obesity, thyroid nodules, pulmonary nodules, chronic back pain, HTN, COPD, hx depression    Limitations  pain and swelling in R axilla and arm, chronic back pain, muscle weakness, impaired balance, decreased RUE AROM, abnormal gait, difficulty walking, impaired functional mobility and transfers,    Repetition  Increases Symptoms    Special Tests  -Stemmer at MPs bilaterally    Patient Stated Goals  relduce pain and improve movement  in my R arm so I can do things I need to do. reduce risk of lymphedema    Pain Onset  More than a month ago    Pain Onset  More than a month ago                           OT Education - 07/16/19 1616    Education  Details  Cont Pt edu for LE self care throughout session. Good return for donning armsleeve using assistive devices.    Person(s) Educated  Patient    Methods  Explanation;Handout;Demonstration    Comprehension  Verbalized understanding;Returned demonstration          OT Long Term Goals - 06/19/19 1400      OT LONG TERM GOAL #5   Status  Unable to assess   garments have not been delivered for fitting yet     OT LONG TERM GOAL #6   Baseline  Max A    Time  12    Period  Weeks    Status  Partially Met    Target Date  08/31/19            Plan - 07/16/19 1616    Clinical Impression Statement  Pt tolerated MLD to RUE/ RUQ to address distal lymphedema and, myofacial release for AWS without difficulty today. Stubborn cording persists at axilla , especially at border of pectoralis and at antecubital fossa.  We were able to palpate cords in volar forarm again today, which suggests stretching thus far may be allowing cording to move more to surface of skin where we are able to work on these tissues more dirrectly with manual therapy. Hand swelling persists, but is less dense than initially.  Stubborn swelling noted at MPs primarily.Cont as per POC.    OT Occupational Profile and History  Comprehensive Assessment- Review of records and extensive additional review of physical, cognitive, psychosocial history related to current functional performance    Occupational performance deficits (Please refer to evaluation for details):  ADL's;IADL's;Social Participation;Leisure;Rest and Sleep;Work    Marketing executive / Function / Physical Skills  ADL;Decreased knowledge of precautions;ROM;UE functional use;Decreased knowledge of use of DME;Edema;Skin integrity;Pain;IADL    Rehab Potential  Good    Clinical Decision Making  Several treatment options, min-mod task modification necessary    Comorbidities Affecting Occupational Performance:  Presence of comorbidities impacting occupational performance     Comorbidities impacting occupational performance description:  see SUBJUECTIVE    Modification or Assistance to Complete Evaluation   Min-Moderate modification of tasks or assist with assess necessary to complete eval    OT Frequency  2x / week    OT Duration  12 weeks    OT Treatment/Interventions  Self-care/ADL training;Therapeutic exercise;Manual lymph drainage;Patient/family education;Therapeutic activities;DME and/or AE instruction;Manual Therapy;Other (comment)   consider fitting with prophylactic cmopression arm sleeve   Plan  BUE AROM ther ex to reduce axillary cording- 2 sets of 10 2-3 x daily    Consulted and Agree with Plan of Care  Patient       Patient will benefit from skilled therapeutic intervention in order to improve the following deficits and impairments:   Body Structure / Function / Physical Skills: ADL, Decreased knowledge of precautions, ROM, UE functional use, Decreased knowledge of use of DME, Edema, Skin integrity, Pain, IADL       Visit Diagnosis: Lymphedema, not elsewhere classified    Problem List Patient Active Problem List   Diagnosis Date Noted  . Edema 04/16/2019  . Postoperative wound infection 03/13/2019  . Seroma of breast 03/11/2019  . Age-related osteoporosis without current pathological fracture 02/27/2019  . Malignant neoplasm of central portion of right breast in female, estrogen receptor positive ([place]) 02/12/2019  . Genetic testing 01/31/2019  . Malignant neoplasm of upper-inner quadrant of right breast in female, estrogen receptor positive ([place]) 01/15/2019  . Long term current use of opiate analgesic 12/02/2018  . Tympanic membrane central perforation, left 11/04/2018  . DDD (degenerative disc disease), lumbosacral 10/10/2018  . Abnormal MRI, lumbar spine (08/29/2018) 10/10/2018  . 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 Lumbar nerve root impingement (Bilateral: L4 & L5) (L>R: L5) 10/10/2018   . Lumbar Spinal instability of L4-5 10/10/2018  . Lumbar facet hypertrophy 10/10/2018  . COPD (chronic obstructive pulmonary disease) with emphysema ([place]) 08/28/2018  . Elevated rheumatoid factor 08/13/2018  . Elevated C-reactive protein (CRP) 08/12/2018  . Elevated sed rate 08/12/2018  . Lumbar facet syndrome (Bilateral) 08/12/2018  . Back pain with left-sided radiculopathy 08/12/2018  . DDD (degenerative disc disease), lumbar 08/12/2018  . Chronic lower extremity pain (Primary Area of Pain) (Bilateral) (L>R) 07/23/2018  . Chronic low back pain (Secondary Area of Pain) (Bilateral) (L>R) w/ sciatica (Bilateral) 07/23/2018  . Chronic pain syndrome 07/23/2018  . Pharmacologic therapy 07/23/2018  . Disorder of skeletal system 07/23/2018  . Problems influencing health status 07/23/2018  . Chronic sacroiliac joint pain [facility] Area of Pain) (Bilateral) (L>R) 07/23/2018  . Status post lumboperitoneal shunt placement 02/26/2018  . 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 Tympanic membrane perforation 05/30/2016  . PMB (postmenopausal bleeding) 04/26/2016  . Rash 12/27/2015  . Intervertebral disc stenosis of neural canal  of lumbar region 05/26/2015  . Plantar fasciitis of foot (Left) 05/26/2015  . Spondylosis of lumbar region without myelopathy or radiculopathy 05/26/2015  . Lumbar foraminal stenosis (L3-4, L4-5, L5-S1) (L>R) 05/26/2015  . Spinal stenosis of lumbar region with neurogenic claudication 05/26/2015  . Encounter for screening mammogram for malignant neoplasm of breast 12/02/2014  . Goiter 12/02/2014  . Chronic idiopathic gout of foot (Right) 07/06/2014  . Biceps tendonitis (Right) 05/19/2014  . Shoulder pain (Right) 05/19/2014  . Facial weakness 05/05/2014  . Post herpetic neuralgia 05/05/2014  . Chronic dysfunction of left eustachian tube 03/24/2014  . Osteopenia 03/13/2014  . Lumbar Grade 1 Anterolisthesis of L3/4 (62m) & L4/5 (4-142m(w/  Dynamic instability) 03/13/2014  . Family history of colon cancer 12/02/2013  . Dyshidrotic eczema 11/17/2013  . Diastasis recti 04/08/2013  . Facial nerve paresis 07/31/2012  . Blepharospasm 05/08/2012  . 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 Obesity 03/28/2012  . Hemoptysis 12/22/2011  . Neuropathic postherpetic trigeminal neuralgia 12/12/2011  . Allergic rhinitis, mild 10/12/2011  . Disequilibrium syndrome 09/27/2011  . Chronic bronchitis (HCThe Hammocks09/24/2012  . Essential hypertension 05/24/2011  . Type 2 diabetes mellitus without complications ([place]0977/[id number]/6578. Pulmonary nodules 05/05/2011    ThAndrey SpearmanMS, OTR/L, CLOsu [facility]1/04/20 4:21 PM  CoEnterpriseAIN RESte Genevieve County Memorial HospitalERVICES [address][id number]: [phone] Fax:  [phone]: LiKayzlee WirtanenRN: [id number] of Birth: 02/1951/01/11

## 2019-07-16 NOTE — Therapy (Signed)
[place] MAIN [facility] SERVICES [address] [place], [place], [address] Phone: [phone]   Fax:  [phone]  Physical Therapy Treatment  Patient Details  Name: Susan Callahan MRN: [id number] Date of Birth: 03-18-1951 No data recorded  Encounter Date: 07/16/2019  PT End of Session - 07/16/19 1604    Visit Number  15    Number of Visits  72    Date for PT Re-Evaluation  08/04/19    Authorization Type  6/10 Pn 10/6    PT Start Time  1516    PT Stop Time  1600    PT Time Calculation (min)  44 min    Equipment Utilized During Treatment  Gait belt    Activity Tolerance  Patient tolerated treatment well;Patient limited by fatigue    Behavior During Therapy  WFL for tasks assessed/performed       Past Medical History:  Diagnosis Date  . COPD (chronic obstructive pulmonary disease) ([place])   . Depression 1974  . Diabetes mellitus without complication ([place])   . Hypertension   . Migraine   . 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 LUMBAR PERITONEAL SHUNT    . TYMPANOSTOMY TUBE PLACEMENT      There were no vitals filed for this visit.  Subjective Assessment - 07/16/19 1547    Subjective  Patient seeing physician/[facility] for breast cx diagram Friday for first time since March.  Sciatic pain "acting up today" feeling lopsided sitting.    Pertinent History  Patient reports balance issues began in 2008. 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  Balance deficit has been constant since 2008, some days are worse than others but is always present to patient. Does not notice a directionality of loss of balance. Patient reports her balance is affecting her standing, walking on uneven surfaces such as outside, trying to walk with dim light, showering and ADLs. Slight visual deficit of R lower quadrant. 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 Has nystagmus when looking to  the R.  Back pain began in 2007 and has gradually worsened to nonstop pain. Medication helps briefly. Patient is challenged with standing with knees extended due to back pain.    Limitations  Lifting;Standing;Walking;House hold activities    How long can you stand comfortably?  pain begins immediately     How long can you walk comfortably?  pain begins immediately     Patient Stated Goals  walk better, have less pain, be able to walk in the grocery store with head turns to scan shelves    Currently in Pain?  Yes    Pain Score  2     Pain Location  Back    Pain Orientation  Lower    Pain Descriptors / Indicators  Aching;Shooting    Pain Type  Chronic pain    Pain Onset  More than a month ago    Pain Frequency  Intermittent        Patient seeing physician/[facility] for breast cx diagram Friday for first time since March.   shifted to right with C curve in seated.      Prone:  Roller to thoracolumbar paraspinals, piriformis, hamstrings, and calves x80mnutes   Hooklying supine: GTB abduction with posterior pelvic tilt 15x Hamstring stretch 60 seconds each LE x2 trials,  opp knee to chest with PT overpressure for piriformis targeting 2x 30 seconds each LE  Nerve glides 20x each side GTB marching with posterior pelvic tilts 12x each LE.  Piriformis stretch with SAD with belt 7x 20 second holds each LE  supine: 4lb ankle weights Posterior pelvic tilt with march 10x each LE  SLR 10x each LE- terminated due to pain.  Straight leg abduction/adduction 10x each LE opp LE in hooklying      Pt educated throughout session about proper posture and technique with exercises. Improved exercise technique, movement at target joints, use of target muscles after min to mod verbal, visual, tactile cues        PT Education - 07/16/19 1552    Education Details  exercise technique, body mechanics    Person(s) Educated  Patient    Methods  Explanation;Demonstration;Tactile cues;Verbal cues     Comprehension  Verbalized understanding;Returned demonstration;Verbal cues required;Tactile cues required       PT Short Term Goals - 07/07/19 1217      PT SHORT TERM GOAL #1   Title  Patient will be independent with completion of HEP to improve ability to complete functional tasks and functional ADLs.    Baseline  10/23: will give HEP next session 11/19: HEP compliance    Time  2    Period  Weeks    Status  Achieved      PT SHORT TERM GOAL #2   Title  Patient will report pain score less than 4/10 on VAS to demonstrate reduction of pain levels with functional tasks.     Baseline  10/23: 6/10 11/19: 5/10 12/12: 9/10 1/10: 7/10  2/11: 3/10     Time  2    Period  Weeks    Status  Achieved        PT Long Term Goals - 07/07/19 1108      PT LONG TERM GOAL #1   Title  Patient will report pain score of <3/10 on VAS for decreased pain levels and ease with functional activities.    Baseline  10/23: 6/10 11/19: 5/10 12/12 9/10 1/10: 7/10 2/11: 3/10 3/9: 6/10  5/26: 8/10 03/17/19: 7/10 8/3: 5/10 8/25: 4/10 9/21: 4/10 10/26: 4/10    Time  4    Period  Weeks    Status  Partially Met    Target Date  08/04/19      PT LONG TERM GOAL #2   Title  Patient will be able to ambulate on treadmill for 62mnutes without increase in back/leg pain demonstrating improved community ambulation and treadmill walking at pulmonary rehab    Baseline  10/23: 077mutes increases pain 11/19: 82m40mtes 12/12: has not had the chance to perform 1/10: was able to do 5 minutes without pain 2/11: able to do 5 minutes then had to bend over 3/9: 5 minutes 30 seconds at 2.0 mphs 5/26: had to walk 10 minutes at cancer center with extreme pain but was able to perform 7/6: walks 10 minutes with increase of pain by 2 points 8/3: able to perform with bent over posture 9/21: able to perform 7 minutes with intervals of 1-2% incline    Time  4    Period  Weeks    Status  Achieved      PT LONG TERM GOAL #3   Title  Patient will  increase dynamic gait index score to >19/24 as to demonstrate reduced fall risk and improved dynamic gait balance for better safety with community/home ambulation.    Baseline  10/1: 11/24 10/26: 18/24    Time  4  Period  Weeks    Status  Partially Met    Target Date  08/04/19      PT LONG TERM GOAL #4   Title  Patient will score 4+/5 on BLE MMT to demonstrate improved glute strength and ability to carry groceries with increased ease up/down stairs at home.    Baseline  10/23: 4-/5 grossly 11/19: 4/5 grossly; hamstrings 4-/5 12/12: 4/5 gross 1/10: 4/5 gross 2/11: gross 4+/5 with hamstrings and abuductors 4-/5 3/9: gross 4+/5 hamstrings and abductors 4-/5 5/27: grossly 3+/5 with pain with LLE movements 7/6: grossly 4-/5 with pain with LLE movement 8/3: deferred due to swelling/pain 8/25: grossly 4/5 without pain 9/21: grossly 4/5 with hamstrings not tested due to spasm    Time  4    Period  Weeks    Status  Partially Met    Target Date  08/04/19      PT LONG TERM GOAL #5   Title  Patient will increase six minute walk test distance to >1000 with minimal increase of pain for progression to community ambulator and improve gait ability    Baseline  10/1:580 ft stopped at 3: 58 due to excessive pain 10/26: 950 ft    Time  4    Period  Weeks    Status  Partially Met    Target Date  08/04/19      PT LONG TERM GOAL #6   Title  Patient will be able to make and/or strip the bed without pain and without needing rest breaks to perform iADLs independently.     Baseline  2/11: pain limits ability to perform, ~4 breaks required 3/9: takes her time, 3 breaks 5/27: very painful still 7/6: requires only one rest break 8/3: able to perform with +2 pain increase 8/25: no rest breaks required, increased pain 4 points 9/21: able to perform with +1-2 symptoms 10/26: able to perform with only occasional symptoms.    Time  4    Period  Weeks    Status  Achieved      PT LONG TERM GOAL #7   Title  Patient will  have decreased modified oswestry score <20% for improved ability to complete household tasks with less pain levels.     Baseline  2/11: 30%  3/9: 30% 5/27: 63% 7/6: 38% 8/3: 42% 8/25: 36% 9/21: 30% 10/26: 26%    Time  4    Period  Weeks    Status  Partially Met    Target Date  08/04/19      PT LONG TERM GOAL #8   Title  Patient will increase Berg Balance score by > 6 points (50/56) to demonstrate decreased fall risk during functional activities.    Baseline  10/1: 44/56 10/26: 49/56    Time  4    Period  Weeks    Status  Partially Met    Target Date  08/04/19            Plan - 07/16/19 1606    Clinical Impression Statement  Patient initially presented with excessive seated weight shift to right gluteals with a C curvature of spine due to radiating symptoms. By end of session patient demonstrates improved upright posture and alignment. Focus on nerve glides, muscle tissue lengthening, and distraction reduced symptoms. Patient will benefit from skilled physical therapy to increase balance, improve strength, decrease pain, and improve functional capacity of mobility for improved quality of life    Personal Factors and Comorbidities  Age;Comorbidity 3+;Fitness;Finances;Past/Current  Experience;Social Background;Time since onset of injury/illness/exacerbation    Comorbidities  COPD, Ramsay Hunt Syndrome, HTN, DM, lymphedema, breast cancer    Examination-Activity Limitations  Bathing;Bend;Carry;Dressing;Hygiene/Grooming;Stairs;Squat;Reach Overhead;Locomotion Level;Lift;Stand;Transfers    Examination-Participation Restrictions  Cleaning;Community Activity;Laundry;Volunteer;Shop;Personal Finances;Yard Work    Merchant navy officer  Evolving/Moderate complexity    Rehab Potential  Fair    PT Frequency  2x / week    PT Duration  4 weeks    PT Treatment/Interventions  ADLs/Self Care Home Management;Cryotherapy;Moist Heat;Traction;Iontophoresis 46m/ml Dexamethasone;Ultrasound;Gait  training;Stair training;Functional mobility training;Neuromuscular re-education;Balance training;Therapeutic exercise;Therapeutic activities;Patient/family education;Manual techniques;Energy conservation;Aquatic Therapy;Electrical Stimulation;Passive range of motion;Dry needling;Taping;Vestibular;Canalith Repostioning    PT Next Visit Plan  VOR, walking with head turns    PT Home Exercise Plan  VOR    Consulted and Agree with Plan of Care  Patient       Patient will benefit from skilled therapeutic intervention in order to improve the following deficits and impairments:  Abnormal gait, Cardiopulmonary status limiting activity, Decreased activity tolerance, Decreased balance, Decreased endurance, Decreased range of motion, Difficulty walking, Decreased strength, Hypomobility, Decreased mobility, Impaired perceived functional ability, Impaired flexibility, Postural dysfunction, Obesity, Improper body mechanics, Pain, Dizziness, Increased edema  Visit Diagnosis: Chronic low back pain, unspecified back pain laterality, unspecified whether sciatica present  Muscle weakness (generalized)  Other abnormalities of gait and mobility  Unsteadiness on feet     Problem List Patient Active Problem List   Diagnosis Date Noted  . Edema 04/16/2019  . Postoperative wound infection 03/13/2019  . Seroma of breast 03/11/2019  . Age-related osteoporosis without current pathological fracture 02/27/2019  . Malignant neoplasm of central portion of right breast in female, estrogen receptor positive ([place]aw[place] 02/12/2019  . Genetic testing 01/31/2019  . Malignant neoplasm of upper-inner quadrant of right breast in female, estrogen receptor positive (HMaugansville 01/15/2019  . Long term current use of opiate analgesic 12/02/2018  . Tympanic membrane central perforation, left 11/04/2018  . DDD (degenerative disc disease), lumbosacral 10/10/2018  . Abnormal MRI, lumbar spine (08/29/2018) 10/10/2018  . 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 Lumbar nerve root impingement (Bilateral: L4 & L5) (L>R: L5) 10/10/2018  . Lumbar Spinal instability of L4-5 10/10/2018  . Lumbar facet hypertrophy 10/10/2018  . COPD (chronic obstructive pulmonary disease) with emphysema ([place]Ge[place] 08/28/2018  . Elevated rheumatoid factor 08/13/2018  . Elevated C-reactive protein (CRP) 08/12/2018  . Elevated sed rate 08/12/2018  . Lumbar facet syndrome (Bilateral) 08/12/2018  . Back pain with left-sided radiculopathy 08/12/2018  . DDD (degenerative disc disease), lumbar 08/12/2018  . Chronic lower extremity pain (Primary Area of Pain) (Bilateral) (L>R) 07/23/2018  . Chronic low back pain (Secondary Area of Pain) (Bilateral) (L>R) w/ sciatica (Bilateral) 07/23/2018  . Chronic pain syndrome 07/23/2018  . Pharmacologic therapy 07/23/2018  . Disorder of skeletal system 07/23/2018  . Problems influencing health status 07/23/2018  . Chronic sacroiliac joint pain ([facility]) (Bilateral) (L>R) 07/23/2018  . Status post lumboperitoneal shunt placement 02/26/2018  . 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 Tympanic membrane perforation 05/30/2016  . PMB (postmenopausal bleeding) 04/26/2016  . Rash 12/27/2015  . 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 Plantar fasciitis of foot (Left) 05/26/2015  . Spondylosis of lumbar region without myelopathy or radiculopathy 05/26/2015  . Lumbar foraminal stenosis (L3-4, L4-5, L5-S1) (L>R) 05/26/2015  . Spinal stenosis of lumbar region with neurogenic claudication 05/26/2015  . Encounter for screening mammogram for malignant neoplasm  of breast 12/02/2014  . Goiter 12/02/2014  . Chronic idiopathic gout of foot (Right) 07/06/2014  . Biceps tendonitis (Right) 05/19/2014  . Shoulder pain (Right) 05/19/2014  . Facial weakness 05/05/2014  . Post herpetic neuralgia 05/05/2014  . Chronic  dysfunction of left eustachian tube 03/24/2014  . Osteopenia 03/13/2014  . Lumbar Grade 1 Anterolisthesis of L3/4 (33m) & L4/5 (4-156m(w/ Dynamic instability) 03/13/2014  . Family history of colon cancer 12/02/2013  . Dyshidrotic eczema 11/17/2013  . Diastasis recti 04/08/2013  . Facial nerve paresis 07/31/2012  . Blepharospasm 05/08/2012  . 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 Obesity 03/28/2012  . Hemoptysis 12/22/2011  . Neuropathic postherpetic trigeminal neuralgia 12/12/2011  . Allergic rhinitis, mild 10/12/2011  . Disequilibrium syndrome 09/27/2011  . Chronic bronchitis (HCCentral09/24/2012  . Essential hypertension 05/24/2011  . Type 2 diabetes mellitus without complications (HCPiqua0972/[id number]/0016. Pulmonary nodules 05/05/2011   MaJanna ArchPT, DPT   07/16/2019, 4:08 PM  CoEllenboroAIN RESummitridge Center- Psychiatry & Addictive MedERVICES [address] CourtdBonny [id number]: [phone] Fax:  [phone]: Susan SampleyRN: [id number] of Birth: 02/1951/03/05

## 2019-07-21 ENCOUNTER — Ambulatory Visit: Payer: Medicare Other | Admitting: Occupational Therapy

## 2019-07-21 ENCOUNTER — Other Ambulatory Visit: Payer: Self-pay

## 2019-07-21 ENCOUNTER — Ambulatory Visit: Payer: Medicare Other

## 2019-07-21 DIAGNOSIS — R2681 Unsteadiness on feet: Secondary | ICD-10-CM

## 2019-07-21 DIAGNOSIS — M545 Low back pain, unspecified: Secondary | ICD-10-CM

## 2019-07-21 DIAGNOSIS — I972 Postmastectomy lymphedema syndrome: Secondary | ICD-10-CM

## 2019-07-21 DIAGNOSIS — G8929 Other chronic pain: Secondary | ICD-10-CM

## 2019-07-21 DIAGNOSIS — M6281 Muscle weakness (generalized): Secondary | ICD-10-CM

## 2019-07-21 DIAGNOSIS — R2689 Other abnormalities of gait and mobility: Secondary | ICD-10-CM

## 2019-07-21 NOTE — Therapy (Signed)
[place] MAIN [facility] SERVICES [address] [place], [place], [address] Phone: [phone]   Fax:  [phone]  Physical Therapy Treatment  Patient Details  Name: Susan Callahan MRN: [id number] Date of Birth: November 15, 1950 No data recorded  Encounter Date: 07/21/2019  PT End of Session - 07/21/19 1107    Visit Number  40    Number of Visits  72    Date for PT Re-Evaluation  08/04/19    Authorization Type  7/10 Pn 10/6    PT Start Time  1026    PT Stop Time  1100    PT Time Calculation (min)  34 min    Equipment Utilized During Treatment  Gait belt    Activity Tolerance  Patient tolerated treatment well    Behavior During Therapy  WFL for tasks assessed/performed       Past Medical History:  Diagnosis Date  . COPD (chronic obstructive pulmonary disease) ([place])   . Depression 1974  . Diabetes mellitus without complication ([place])   . Hypertension   . Migraine   . 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 LUMBAR PERITONEAL SHUNT    . TYMPANOSTOMY TUBE PLACEMENT      There were no vitals filed for this visit.  Subjective Assessment - 07/21/19 1105    Subjective  Patient reports having a good visit with oncologist Friday, reports it was a "clean report". No back pain today, or sciatic pain.    Pertinent History  Patient reports balance issues began in 2008. 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  Balance deficit has been constant since 2008, some days are worse than others but is always present to patient. Does not notice a directionality of loss of balance. Patient reports her balance is affecting her standing, walking on uneven surfaces such as outside, trying to walk with dim light, showering and ADLs. Slight visual deficit of R lower quadrant. 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 Has nystagmus when looking to the R.  Back pain began in 2007 and has  gradually worsened to nonstop pain. Medication helps briefly. Patient is challenged with standing with knees extended due to back pain.    Limitations  Lifting;Standing;Walking;House hold activities    How long can you stand comfortably?  pain begins immediately     How long can you walk comfortably?  pain begins immediately     Patient Stated Goals  walk better, have less pain, be able to walk in the grocery store with head turns to scan shelves    Currently in Pain?  No/denies         Treat:   Prone: gastroc/soleous and hamstring rollout with stick x 3 minutes  airex pad:  -static stand in front of raised mat table hold 30 seconds x 2 trials with decreased episodes of ankle instability  -slow march with single finger tip support 5 second eccentric/concentric phase for single limb challenge 12x each LE -modified tandem 30 second holds x 2 trials each LE -sit to stand with under feet 10 x no UE support    Ankle strengthening interventions  Access Code: [email or web address]  URL: [email or web address]  Date: 07/21/2019  Prepared by: Janna Arch   Exercises Seated Eccentric Ankle Plantar Flexion with Resistance - 10 reps - 2 sets - 5 hold - 1x daily - 7x weekly Seated Ankle Eversion with Resistance - 10 reps - 2  sets - 5 hold - 1x daily - 7x weekly Seated Ankle Eversion with Anchored Resistance - 10 reps - 2 sets - 5 hold - 1x daily - 7x weekly Ankle Inversion with Anchored Resistance at Table - 10 reps - 2 sets - 5 hold - 1x daily - 7x weekly Seated Ankle Inversion with Resistance and Legs Crossed - 10 reps - 2 sets - 5 hold - 1x daily - 7x weekly Seated Ankle Dorsiflexion with Resistance - 10 reps - 2 sets - 5 hold - 1x daily - 7x weekly                PT Education - 07/21/19 1106    Education Details  exercise technique, body mechanics    Person(s) Educated  Patient    Methods  Explanation;Demonstration;Tactile cues;Verbal cues    Comprehension  Returned  demonstration;Verbalized understanding;Verbal cues required;Tactile cues required       PT Short Term Goals - 07/07/19 1217      PT SHORT TERM GOAL #1   Title  Patient will be independent with completion of HEP to improve ability to complete functional tasks and functional ADLs.    Baseline  10/23: will give HEP next session 11/19: HEP compliance    Time  2    Period  Weeks    Status  Achieved      PT SHORT TERM GOAL #2   Title  Patient will report pain score less than 4/10 on VAS to demonstrate reduction of pain levels with functional tasks.     Baseline  10/23: 6/10 11/19: 5/10 12/12: 9/10 1/10: 7/10  2/11: 3/10     Time  2    Period  Weeks    Status  Achieved        PT Long Term Goals - 07/07/19 1108      PT LONG TERM GOAL #1   Title  Patient will report pain score of <3/10 on VAS for decreased pain levels and ease with functional activities.    Baseline  10/23: 6/10 11/19: 5/10 12/12 9/10 1/10: 7/10 2/11: 3/10 3/9: 6/10  5/26: 8/10 03/17/19: 7/10 8/3: 5/10 8/25: 4/10 9/21: 4/10 10/26: 4/10    Time  4    Period  Weeks    Status  Partially Met    Target Date  08/04/19      PT LONG TERM GOAL #2   Title  Patient will be able to ambulate on treadmill for 64mnutes without increase in back/leg pain demonstrating improved community ambulation and treadmill walking at pulmonary rehab    Baseline  10/23: 066mutes increases pain 11/19: 8m77mtes 12/12: has not had the chance to perform 1/10: was able to do 5 minutes without pain 2/11: able to do 5 minutes then had to bend over 3/9: 5 minutes 30 seconds at 2.0 mphs 5/26: had to walk 10 minutes at cancer center with extreme pain but was able to perform 7/6: walks 10 minutes with increase of pain by 2 points 8/3: able to perform with bent over posture 9/21: able to perform 7 minutes with intervals of 1-2% incline    Time  4    Period  Weeks    Status  Achieved      PT LONG TERM GOAL #3   Title  Patient will increase dynamic gait index  score to >19/24 as to demonstrate reduced fall risk and improved dynamic gait balance for better safety with community/home ambulation.    Baseline  10/1:  11/24 10/26: 18/24    Time  4    Period  Weeks    Status  Partially Met    Target Date  08/04/19      PT LONG TERM GOAL #4   Title  Patient will score 4+/5 on BLE MMT to demonstrate improved glute strength and ability to carry groceries with increased ease up/down stairs at home.    Baseline  10/23: 4-/5 grossly 11/19: 4/5 grossly; hamstrings 4-/5 12/12: 4/5 gross 1/10: 4/5 gross 2/11: gross 4+/5 with hamstrings and abuductors 4-/5 3/9: gross 4+/5 hamstrings and abductors 4-/5 5/27: grossly 3+/5 with pain with LLE movements 7/6: grossly 4-/5 with pain with LLE movement 8/3: deferred due to swelling/pain 8/25: grossly 4/5 without pain 9/21: grossly 4/5 with hamstrings not tested due to spasm    Time  4    Period  Weeks    Status  Partially Met    Target Date  08/04/19      PT LONG TERM GOAL #5   Title  Patient will increase six minute walk test distance to >1000 with minimal increase of pain for progression to community ambulator and improve gait ability    Baseline  10/1:580 ft stopped at 3: 58 due to excessive pain 10/26: 950 ft    Time  4    Period  Weeks    Status  Partially Met    Target Date  08/04/19      PT LONG TERM GOAL #6   Title  Patient will be able to make and/or strip the bed without pain and without needing rest breaks to perform iADLs independently.     Baseline  2/11: pain limits ability to perform, ~4 breaks required 3/9: takes her time, 3 breaks 5/27: very painful still 7/6: requires only one rest break 8/3: able to perform with +2 pain increase 8/25: no rest breaks required, increased pain 4 points 9/21: able to perform with +1-2 symptoms 10/26: able to perform with only occasional symptoms.    Time  4    Period  Weeks    Status  Achieved      PT LONG TERM GOAL #7   Title  Patient will have decreased modified  oswestry score <20% for improved ability to complete household tasks with less pain levels.     Baseline  2/11: 30%  3/9: 30% 5/27: 63% 7/6: 38% 8/3: 42% 8/25: 36% 9/21: 30% 10/26: 26%    Time  4    Period  Weeks    Status  Partially Met    Target Date  08/04/19      PT LONG TERM GOAL #8   Title  Patient will increase Berg Balance score by > 6 points (50/56) to demonstrate decreased fall risk during functional activities.    Baseline  10/1: 44/56 10/26: 49/56    Time  4    Period  Weeks    Status  Partially Met    Target Date  08/04/19            Plan - 07/21/19 1107    Clinical Impression Statement  Patient arrived late to session limiting session duration. Ankle strengthening interventions added to HEP to increase ankle righting reactions for stability. Patient has limited ankle righting reactions that fatigues quickly, however improves with cueing for breathing. Patient will benefit from skilled physical therapy to increase balance, improve strength, decrease pain, and improve functional capacity of mobility for improved quality of life    Personal Factors  and Comorbidities  Age;Comorbidity 3+;Fitness;Finances;Past/Current Experience;Social Background;Time since onset of injury/illness/exacerbation    Comorbidities  COPD, Ramsay Hunt Syndrome, HTN, DM, lymphedema, breast cancer    Examination-Activity Limitations  Bathing;Bend;Carry;Dressing;Hygiene/Grooming;Stairs;Squat;Reach Overhead;Locomotion Level;Lift;Stand;Transfers    Examination-Participation Restrictions  Cleaning;Community Activity;Laundry;Volunteer;Shop;Personal Finances;Yard Work    Merchant navy officer  Evolving/Moderate complexity    Rehab Potential  Fair    PT Frequency  2x / week    PT Duration  4 weeks    PT Treatment/Interventions  ADLs/Self Care Home Management;Cryotherapy;Moist Heat;Traction;Iontophoresis 32m/ml Dexamethasone;Ultrasound;Gait training;Stair training;Functional mobility  training;Neuromuscular re-education;Balance training;Therapeutic exercise;Therapeutic activities;Patient/family education;Manual techniques;Energy conservation;Aquatic Therapy;Electrical Stimulation;Passive range of motion;Dry needling;Taping;Vestibular;Canalith Repostioning    PT Next Visit Plan  VOR, walking with head turns    PT Home Exercise Plan  VOR    Consulted and Agree with Plan of Care  Patient       Patient will benefit from skilled therapeutic intervention in order to improve the following deficits and impairments:  Abnormal gait, Cardiopulmonary status limiting activity, Decreased activity tolerance, Decreased balance, Decreased endurance, Decreased range of motion, Difficulty walking, Decreased strength, Hypomobility, Decreased mobility, Impaired perceived functional ability, Impaired flexibility, Postural dysfunction, Obesity, Improper body mechanics, Pain, Dizziness, Increased edema  Visit Diagnosis: Chronic low back pain, unspecified back pain laterality, unspecified whether sciatica present  Muscle weakness (generalized)  Other abnormalities of gait and mobility  Unsteadiness on feet     Problem List Patient Active Problem List   Diagnosis Date Noted  . Edema 04/16/2019  . Postoperative wound infection 03/13/2019  . Seroma of breast 03/11/2019  . Age-related osteoporosis without current pathological fracture 02/27/2019  . Malignant neoplasm of central portion of right breast in female, estrogen receptor positive ([place] 02/12/2019  . Genetic testing 01/31/2019  . Malignant neoplasm of upper-inner quadrant of right breast in female, estrogen receptor positive ([place] 01/15/2019  . Long term current use of opiate analgesic 12/02/2018  . Tympanic membrane central perforation, left 11/04/2018  . DDD (degenerative disc disease), lumbosacral 10/10/2018  . Abnormal MRI, lumbar spine (08/29/2018) 10/10/2018  . 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 Lumbar nerve root impingement (Bilateral: L4 & L5) (L>R: L5) 10/10/2018  . Lumbar Spinal instability of L4-5 10/10/2018  . Lumbar facet hypertrophy 10/10/2018  . COPD (chronic obstructive pulmonary disease) with emphysema ([place] 08/28/2018  . Elevated rheumatoid factor 08/13/2018  . Elevated C-reactive protein (CRP) 08/12/2018  . Elevated sed rate 08/12/2018  . Lumbar facet syndrome (Bilateral) 08/12/2018  . Back pain with left-sided radiculopathy 08/12/2018  . DDD (degenerative disc disease), lumbar 08/12/2018  . Chronic lower extremity pain (Primary Area of Pain) (Bilateral) (L>R) 07/23/2018  . Chronic low back pain (Secondary Area of Pain) (Bilateral) (L>R) w/ sciatica (Bilateral) 07/23/2018  . Chronic pain syndrome 07/23/2018  . Pharmacologic therapy 07/23/2018  . Disorder of skeletal system 07/23/2018  . Problems influencing health status 07/23/2018  . Chronic sacroiliac joint pain ([facility]) (Bilateral) (L>R) 07/23/2018  . Status post lumboperitoneal shunt placement 02/26/2018  . 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 Tympanic membrane perforation 05/30/2016  . PMB (postmenopausal bleeding) 04/26/2016  . Rash 12/27/2015  . 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 Plantar fasciitis of foot (Left) 05/26/2015  . Spondylosis of lumbar region without myelopathy or radiculopathy 05/26/2015  . Lumbar foraminal stenosis (L3-4, L4-5, L5-S1) (L>R) 05/26/2015  . Spinal stenosis of lumbar region with neurogenic claudication 05/26/2015  . Encounter for  screening mammogram for malignant neoplasm of breast 12/02/2014  . Goiter 12/02/2014  . Chronic idiopathic gout of foot (Right) 07/06/2014  . Biceps tendonitis (Right) 05/19/2014  . Shoulder pain (Right) 05/19/2014  . Facial weakness 05/05/2014  . Post herpetic neuralgia 05/05/2014  . Chronic dysfunction of left eustachian tube 03/24/2014   . Osteopenia 03/13/2014  . Lumbar Grade 1 Anterolisthesis of L3/4 (49m) & L4/5 (4-147m(w/ Dynamic instability) 03/13/2014  . Family history of colon cancer 12/02/2013  . Dyshidrotic eczema 11/17/2013  . Diastasis recti 04/08/2013  . Facial nerve paresis 07/31/2012  . Blepharospasm 05/08/2012  . 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 Obesity 03/28/2012  . Hemoptysis 12/22/2011  . Neuropathic postherpetic trigeminal neuralgia 12/12/2011  . Allergic rhinitis, mild 10/12/2011  . Disequilibrium syndrome 09/27/2011  . Chronic bronchitis (HCLake Village09/24/2012  . Essential hypertension 05/24/2011  . Type 2 diabetes mellitus without complications ([id number]. Pulmonary nodules 05/05/2011   MaJanna ArchPT, DPT   07/21/2019, 11:09 AM  CoVeteranAIN REKissimmee Endoscopy CenterERVICES [address]rived[place]hone: [phone] Fax:  [phone]: LiMasen SalvasRN: [id number] of Birth: [birth year]

## 2019-07-21 NOTE — Therapy (Signed)
[place] MAIN [facility] SERVICES [address] [place], [place], [address] Phone: [phone]   Fax:  [phone]  Occupational Therapy Treatment  Patient Details  Name: Susan Callahan MRN: [id number] Date of Birth: 1950-10-07 Referring Provider (OT): Humberto Leep, MD   Encounter Date: 07/21/2019  OT End of Session - 07/21/19 1307    Visit Number  44    Number of Visits  36    Date for OT Re-Evaluation  09/18/19    OT Start Time  2620    OT Stop Time  3559    OT Time Calculation (min)  60 min    Activity Tolerance  Patient tolerated treatment well;No increased pain    Behavior During Therapy  WFL for tasks assessed/performed       Past Medical History:  Diagnosis Date  . COPD (chronic obstructive pulmonary disease) ([place])   . Depression 1974  . Diabetes mellitus without complication ([place])   . Hypertension   . Migraine   . 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 LUMBAR PERITONEAL SHUNT    . TYMPANOSTOMY TUBE PLACEMENT      There were no vitals filed for this visit.  Subjective Assessment - 07/21/19 1303    Subjective   Pt presents for OT visit 44/72 (48) to address RUE/ RUQ post-mastectomy lymphedema and AWS. Pt reports her appointment with Dalene Carrow went well and she is hoping for a consult with [facility] plastics to explore treatment options for R axillary cording.    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 01/30/19 partial mastectomy. Margins clear. 0/2 LN  negative for  disease. actinic keritosis,DDD, DM, obesity, thyroid nodules, pulmonary nodules, chronic back pain, HTN, COPD, hx depression    Limitations  pain and swelling in R axilla and arm, chronic back pain, muscle weakness, impaired balance, decreased RUE AROM, abnormal gait, difficulty walking, impaired functional mobility and transfers,    Repetition  Increases Symptoms    Special Tests  -Stemmer at MPs bilaterally    Patient Stated Goals  relduce pain and improve movement  in my R arm so I can do things I need to do. reduce risk of lymphedema    Pain Onset  More than a month ago    Pain Onset  More than a month ago                   OT Treatments/Exercises (OP) - 07/21/19 0001      ADLs   ADL Education Given  Yes      Manual Therapy   Manual Therapy  Edema management;Manual Lymphatic Drainage (MLD);Compression Bandaging    Manual Lymphatic Drainage (MLD)  MLD to RUE/RUQ as established using cntalateral AAA    Compression Bandaging  Pt donns ustom compression glove and sleeve with modified independence after session using nylon slippy and friction gloves.             OT Education - 07/21/19 1307    Education Details  Cont Pt edu for LE self care throughout session. Good return for donning armsleeve using assistive devices.    Person(s) Educated  Patient    Methods  Explanation;Handout;Demonstration    Comprehension  Verbalized understanding;Returned demonstration          OT Long Term Goals - 06/19/19 1400      OT LONG TERM GOAL #5  Status  Unable to assess   garments have not been delivered for fitting yet     OT LONG TERM GOAL #6   Baseline  Max A    Time  12    Period  Weeks    Status  Partially Met    Target Date  08/31/19            Plan - 07/21/19 1308    Clinical Impression Statement  Emphasis of session on RUE/RUQ MLD to reduce persistent hand swelling.Stubborn hand swelling is decongested by ~ 15% by visiual assessment and palpation at end of session. Arm well decongested without swelling before and after manual therapy. Pt able to don custom sleeve and glove with modified independence todayusing assistive devices in clinic. Cont as per POC.    OT Occupational Profile and History  Comprehensive Assessment- Review of records and extensive additional review of physical, cognitive, psychosocial history  related to current functional performance    Occupational performance deficits (Please refer to evaluation for details):  ADL's;IADL's;Social Participation;Leisure;Rest and Sleep;Work    Marketing executive / Function / Physical Skills  ADL;Decreased knowledge of precautions;ROM;UE functional use;Decreased knowledge of use of DME;Edema;Skin integrity;Pain;IADL    Rehab Potential  Good    Clinical Decision Making  Several treatment options, min-mod task modification necessary    Comorbidities Affecting Occupational Performance:  Presence of comorbidities impacting occupational performance    Comorbidities impacting occupational performance description:  see SUBJUECTIVE    Modification or Assistance to Complete Evaluation   Min-Moderate modification of tasks or assist with assess necessary to complete eval    OT Frequency  2x / week    OT Duration  12 weeks    OT Treatment/Interventions  Self-care/ADL training;Therapeutic exercise;Manual lymph drainage;Patient/family education;Therapeutic activities;DME and/or AE instruction;Manual Therapy;Other (comment)   consider fitting with prophylactic cmopression arm sleeve   Plan  BUE AROM ther ex to reduce axillary cording- 2 sets of 10 2-3 x daily    Consulted and Agree with Plan of Care  Patient       Patient will benefit from skilled therapeutic intervention in order to improve the following deficits and impairments:   Body Structure / Function / Physical Skills: ADL, Decreased knowledge of precautions, ROM, UE functional use, Decreased knowledge of use of DME, Edema, Skin integrity, Pain, IADL       Visit Diagnosis: Post-mastectomy lymphedema syndrome    Problem List Patient Active Problem List   Diagnosis Date Noted  . Edema 04/16/2019  . Postoperative wound infection 03/13/2019  . Seroma of breast 03/11/2019  . Age-related osteoporosis without current pathological fracture 02/27/2019  . Malignant neoplasm of central portion of right breast  in female, estrogen receptor positive ([place]) 02/12/2019  . Genetic testing 01/31/2019  . Malignant neoplasm of upper-inner quadrant of right breast in female, estrogen receptor positive ([place]) 01/15/2019  . Long term current use of opiate analgesic 12/02/2018  . Tympanic membrane central perforation, left 11/04/2018  . DDD (degenerative disc disease), lumbosacral 10/10/2018  . Abnormal MRI, lumbar spine (08/29/2018) 10/10/2018  . 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 Lumbar nerve root impingement (Bilateral: L4 & L5) (L>R: L5) 10/10/2018  . Lumbar Spinal instability of L4-5 10/10/2018  . Lumbar facet hypertrophy 10/10/2018  . COPD (chronic obstructive pulmonary disease) with emphysema ([place]) 08/28/2018  . Elevated rheumatoid factor 08/13/2018  . Elevated C-reactive protein (CRP) 08/12/2018  . Elevated sed rate 08/12/2018  . Lumbar facet syndrome (Bilateral) 08/12/2018  .  Back pain with left-sided radiculopathy 08/12/2018  . DDD (degenerative disc disease), lumbar 08/12/2018  . Chronic lower extremity pain (Primary Area of Pain) (Bilateral) (L>R) 07/23/2018  . Chronic low back pain (Secondary Area of Pain) (Bilateral) (L>R) w/ sciatica (Bilateral) 07/23/2018  . Chronic pain syndrome 07/23/2018  . Pharmacologic therapy 07/23/2018  . Disorder of skeletal system 07/23/2018  . Problems influencing health status 07/23/2018  . Chronic sacroiliac joint pain [facility] Area of Pain) (Bilateral) (L>R) 07/23/2018  . Status post lumboperitoneal shunt placement 02/26/2018  . 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 Tympanic membrane perforation 05/30/2016  . PMB (postmenopausal bleeding) 04/26/2016  . Rash 12/27/2015  . 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 Plantar fasciitis of foot (Left) 05/26/2015  . Spondylosis of lumbar region without myelopathy or radiculopathy 05/26/2015  .  Lumbar foraminal stenosis (L3-4, L4-5, L5-S1) (L>R) 05/26/2015  . Spinal stenosis of lumbar region with neurogenic claudication 05/26/2015  . Encounter for screening mammogram for malignant neoplasm of breast 12/02/2014  . Goiter 12/02/2014  . Chronic idiopathic gout of foot (Right) 07/06/2014  . Biceps tendonitis (Right) 05/19/2014  . Shoulder pain (Right) 05/19/2014  . Facial weakness 05/05/2014  . Post herpetic neuralgia 05/05/2014  . Chronic dysfunction of left eustachian tube 03/24/2014  . Osteopenia 03/13/2014  . Lumbar Grade 1 Anterolisthesis of L3/4 (73m) & L4/5 (4-144m(w/ Dynamic instability) 03/13/2014  . Family history of colon cancer 12/02/2013  . Dyshidrotic eczema 11/17/2013  . Diastasis recti 04/08/2013  . Facial nerve paresis 07/31/2012  . Blepharospasm 05/08/2012  . 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 Obesity 03/28/2012  . Hemoptysis 12/22/2011  . Neuropathic postherpetic trigeminal neuralgia 12/12/2011  . Allergic rhinitis, mild 10/12/2011  . Disequilibrium syndrome 09/27/2011  . Chronic bronchitis (HCMar[place]09/24/2012  . Essential hypertension 05/24/2011  . Type 2 diabetes mellitus without complications ([id number]. Pulmonary nodules 05/05/2011    ThAndrey SpearmanMS, OTR/L, CLMichael E. Deb[facility]y [facility]1/09/20 1:13 PM  CoSpartaAIN RESt Joseph Hospital Milford Med CtrERVICES [id number]dNew [place]hone: [phone] Fax:  [phone]: Susan RhodyRN: [id number] of Birth: 6/19-Jan-1951

## 2019-07-23 ENCOUNTER — Other Ambulatory Visit: Payer: Self-pay

## 2019-07-23 ENCOUNTER — Ambulatory Visit: Payer: Medicare Other | Admitting: Occupational Therapy

## 2019-07-23 DIAGNOSIS — I972 Postmastectomy lymphedema syndrome: Secondary | ICD-10-CM

## 2019-07-23 NOTE — Therapy (Signed)
[place] MAIN [facility] SERVICES [address] [place], [place], [address] Phone: [phone]   Fax:  [phone]  Occupational Therapy Treatment  Patient Details  Name: Susan Callahan MRN: [id number] Date of Birth: 03-09-1951 Referring Provider (OT): Humberto Leep, MD   Encounter Date: 07/23/2019  OT End of Session - 07/23/19 1622    Visit Number  45    Number of Visits  72    Date for OT Re-Evaluation  09/18/19    OT Start Time  0203    OT Stop Time  0315    OT Time Calculation (min)  72 min    Activity Tolerance  Patient tolerated treatment well;No increased pain    Behavior During Therapy  WFL for tasks assessed/performed       Past Medical History:  Diagnosis Date  . COPD (chronic obstructive pulmonary disease) ([place])   . Depression 1974  . Diabetes mellitus without complication ([place])   . Hypertension   . Migraine   . 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 LUMBAR PERITONEAL SHUNT    . TYMPANOSTOMY TUBE PLACEMENT      There were no vitals filed for this visit.  Subjective Assessment - 07/23/19 1412    Subjective   Pt presents for OT visit 45/72 (14) to address RUE/ RUQ post-mastectomy lymphedema and AWS. Pt reports pain of 3/10 in R axilla, " apulling and soreness".    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 01/30/19 partial mastectomy. Margins clear. 0/2 LN  negative for  disease. actinic keritosis,DDD, DM, obesity, thyroid nodules, pulmonary nodules, chronic back pain, HTN, COPD, hx depression    Limitations  pain and swelling in R axilla and arm, chronic back pain, muscle weakness, impaired balance, decreased RUE AROM, abnormal gait, difficulty walking, impaired functional mobility and transfers,    Repetition  Increases Symptoms    Special Tests  -Stemmer at MPs bilaterally    Patient Stated Goals  relduce pain and improve  movement  in my R arm so I can do things I need to do. reduce risk of lymphedema    Pain Onset  More than a month ago    Pain Onset  More than a month ago                   OT Treatments/Exercises (OP) - 07/23/19 0001      ADLs   ADL Education Given  Yes  (Pended)       Manual Therapy   Manual Therapy  Edema management;Manual Lymphatic Drainage (MLD);Compression Bandaging  (Pended)              OT Education - 07/23/19 1622    Education Details  Cont Pt edu for LE self care throughout session. Good return for donning armsleeve using assistive devices.    Person(s) Educated  Patient    Methods  Explanation;Handout;Demonstration    Comprehension  Verbalized understanding;Returned demonstration          OT Long Term Goals - 06/19/19 1400      OT LONG TERM GOAL #5   Status  Unable to assess   garments have not been delivered for fitting yet     OT LONG TERM GOAL #6   Baseline  Max A    Time  12    Period  Weeks    Status  Partially  Met    Target Date  08/31/19            Plan - 07/23/19 1623    Clinical Impression Statement  Manual therapy directed towards relieving tight axillary cording. Session initiated and ended with MLD to RUE/RUQ to flush any increased fluid congestion and debris towards ipsilateral anterior axillary pathway. Pt tolerated myofacial technixes to R axilla and antecubital fossa with elbow and wrist extended, shoulder abducted        and flexed to 90 degrees. Cords remain palpable as they continue to bowstring towards the surface of the skin. Pt donned compression garments independently after session. Cont as per POC.    OT Occupational Profile and History  Comprehensive Assessment- Review of records and extensive additional review of physical, cognitive, psychosocial history related to current functional performance    Occupational performance deficits (Please refer to evaluation for details):  ADL's;IADL's;Social  Participation;Leisure;Rest and Sleep;Work    Marketing executive / Function / Physical Skills  ADL;Decreased knowledge of precautions;ROM;UE functional use;Decreased knowledge of use of DME;Edema;Skin integrity;Pain;IADL    Rehab Potential  Good    Clinical Decision Making  Several treatment options, min-mod task modification necessary    Comorbidities Affecting Occupational Performance:  Presence of comorbidities impacting occupational performance    Comorbidities impacting occupational performance description:  see SUBJUECTIVE    Modification or Assistance to Complete Evaluation   Min-Moderate modification of tasks or assist with assess necessary to complete eval    OT Frequency  2x / week    OT Duration  12 weeks    OT Treatment/Interventions  Self-care/ADL training;Therapeutic exercise;Manual lymph drainage;Patient/family education;Therapeutic activities;DME and/or AE instruction;Manual Therapy;Other (comment)   consider fitting with prophylactic cmopression arm sleeve   Plan  BUE AROM ther ex to reduce axillary cording- 2 sets of 10 2-3 x daily    Consulted and Agree with Plan of Care  Patient       Patient will benefit from skilled therapeutic intervention in order to improve the following deficits and impairments:   Body Structure / Function / Physical Skills: ADL, Decreased knowledge of precautions, ROM, UE functional use, Decreased knowledge of use of DME, Edema, Skin integrity, Pain, IADL       Visit Diagnosis: Post-mastectomy lymphedema syndrome    Problem List Patient Active Problem List   Diagnosis Date Noted  . Edema 04/16/2019  . Postoperative wound infection 03/13/2019  . Seroma of breast 03/11/2019  . Age-related osteoporosis without current pathological fracture 02/27/2019  . Malignant neoplasm of central portion of right breast in female, estrogen receptor positive ([place]) 02/12/2019  . Genetic testing 01/31/2019  . Malignant neoplasm of upper-inner quadrant of right  breast in female, estrogen receptor positive ([place]) 01/15/2019  . Long term current use of opiate analgesic 12/02/2018  . Tympanic membrane central perforation, left 11/04/2018  . DDD (degenerative disc disease), lumbosacral 10/10/2018  . Abnormal MRI, lumbar spine (08/29/2018) 10/10/2018  . 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 Lumbar nerve root impingement (Bilateral: L4 & L5) (L>R: L5) 10/10/2018  . Lumbar Spinal instability of L4-5 10/10/2018  . Lumbar facet hypertrophy 10/10/2018  . COPD (chronic obstructive pulmonary disease) with emphysema ([place]) 08/28/2018  . Elevated rheumatoid factor 08/13/2018  . Elevated C-reactive protein (CRP) 08/12/2018  . Elevated sed rate 08/12/2018  . Lumbar facet syndrome (Bilateral) 08/12/2018  . Back pain with left-sided radiculopathy 08/12/2018  . DDD (degenerative disc disease), lumbar 08/12/2018  . Chronic lower extremity pain (Primary  Area of Pain) (Bilateral) (L>R) 07/23/2018  . Chronic low back pain (Secondary Area of Pain) (Bilateral) (L>R) w/ sciatica (Bilateral) 07/23/2018  . Chronic pain syndrome 07/23/2018  . Pharmacologic therapy 07/23/2018  . Disorder of skeletal system 07/23/2018  . Problems influencing health status 07/23/2018  . Chronic sacroiliac joint pain [facility] Area of Pain) (Bilateral) (L>R) 07/23/2018  . Status post lumboperitoneal shunt placement 02/26/2018  . 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 Tympanic membrane perforation 05/30/2016  . PMB (postmenopausal bleeding) 04/26/2016  . Rash 12/27/2015  . 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 Plantar fasciitis of foot (Left) 05/26/2015  . Spondylosis of lumbar region without myelopathy or radiculopathy 05/26/2015  . Lumbar foraminal stenosis (L3-4, L4-5, L5-S1) (L>R) 05/26/2015  . Spinal stenosis of lumbar region with neurogenic claudication 05/26/2015  .  Encounter for screening mammogram for malignant neoplasm of breast 12/02/2014  . Goiter 12/02/2014  . Chronic idiopathic gout of foot (Right) 07/06/2014  . Biceps tendonitis (Right) 05/19/2014  . Shoulder pain (Right) 05/19/2014  . Facial weakness 05/05/2014  . Post herpetic neuralgia 05/05/2014  . Chronic dysfunction of left eustachian tube 03/24/2014  . Osteopenia 03/13/2014  . Lumbar Grade 1 Anterolisthesis of L3/4 (13m) & L4/5 (4-157m(w/ Dynamic instability) 03/13/2014  . Family history of colon cancer 12/02/2013  . Dyshidrotic eczema 11/17/2013  . Diastasis recti 04/08/2013  . Facial nerve paresis 07/31/2012  . Blepharospasm 05/08/2012  . 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 Obesity 03/28/2012  . Hemoptysis 12/22/2011  . Neuropathic postherpetic trigeminal neuralgia 12/12/2011  . Allergic rhinitis, mild 10/12/2011  . Disequilibrium syndrome 09/27/2011  . Chronic bronchitis (HC[place] Plains09/24/2012  . Essential hypertension 05/24/2011  . Type 2 diabetes mellitus without complications ([id number]/81/2751. Pulmonary nodules 05/05/2011    ThAndrey SpearmanMS, OTR/L, CLEncompass Health [facility]1/11/20 4:29 PM  CoFall BranchAIN REFrench Hospital Medical CenterERVICES [id number][id number]: [phone] Fax:  [phone]: LiTorri MichalskiRN: [id number] of Birth: 6/Mar 24, 1951

## 2019-07-28 ENCOUNTER — Other Ambulatory Visit: Payer: Self-pay

## 2019-07-28 ENCOUNTER — Ambulatory Visit: Payer: Medicare Other | Admitting: Occupational Therapy

## 2019-07-28 DIAGNOSIS — I972 Postmastectomy lymphedema syndrome: Secondary | ICD-10-CM | POA: Diagnosis not present

## 2019-07-29 NOTE — Therapy (Signed)
[place] MAIN [facility] SERVICES [address] [place], [place], [address] Phone: [phone]   Fax:  [phone]  Occupational Therapy Treatment  Patient Details  Name: Susan Callahan MRN: [id number] Date of Birth: 27-Jan-1951 Referring Provider (OT): Humberto Leep, MD   Encounter Date: 07/28/2019  OT End of Session - 07/29/19 2423    Visit Number  46    Number of Visits  72    Date for OT Re-Evaluation  09/18/19    OT Start Time  0215    OT Stop Time  0315    OT Time Calculation (min)  60 min    Activity Tolerance  Patient tolerated treatment well;No increased pain    Behavior During Therapy  WFL for tasks assessed/performed       Past Medical History:  Diagnosis Date  . COPD (chronic obstructive pulmonary disease) ([place])   . Depression 1974  . Diabetes mellitus without complication ([place])   . Hypertension   . Migraine   . 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 LUMBAR PERITONEAL SHUNT    . TYMPANOSTOMY TUBE PLACEMENT      There were no vitals filed for this visit.  Subjective Assessment - 07/29/19 1603    Subjective   Pt presents for OT visit 46/72 (36) to address RUE/ RUQ post-mastectomy lymphedema and AWS. Pt reports pain of 3/10 in R axilla, " apulling and soreness".    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 01/30/19 partial mastectomy. Margins clear. 0/2 LN  negative for  disease. actinic keritosis,DDD, DM, obesity, thyroid nodules, pulmonary nodules, chronic back pain, HTN, COPD, hx depression    Limitations  pain and swelling in R axilla and arm, chronic back pain, muscle weakness, impaired balance, decreased RUE AROM, abnormal gait, difficulty walking, impaired functional mobility and transfers,    Repetition  Increases Symptoms    Special Tests  -Stemmer at MPs bilaterally    Patient Stated Goals  relduce pain and improve  movement  in my R arm so I can do things I need to do. reduce risk of lymphedema    Pain Onset  More than a month ago    Pain Onset  More than a month ago                   OT Treatments/Exercises (OP) - 07/29/19 0001      ADLs   ADL Education Given  Yes      Manual Therapy   Manual Therapy  Edema management;Manual Lymphatic Drainage (MLD)    Myofascial Release  for cord release R axilla    Manual Lymphatic Drainage (MLD)  MLD to RUE/RUQ as established using cntalateral AAA    Compression Bandaging  Pt donns ustom compression glove and sleeve with modified independence after session using nylon slippy and friction gloves.             OT Education - 07/29/19 5361    Education Details  Cont Pt edu for LE self care throughout session. Good return for donning armsleeve using assistive devices.    Person(s) Educated  Patient    Methods  Explanation;Handout;Demonstration    Comprehension  Verbalized understanding;Returned demonstration          OT Long Term Goals - 06/19/19 1400      OT LONG TERM GOAL #5   Status  Unable to  assess   garments have not been delivered for fitting yet     OT LONG TERM GOAL #6   Baseline  Max A    Time  12    Period  Weeks    Status  Partially Met    Target Date  08/31/19            Plan - 07/29/19 1605    Clinical Impression Statement  Pt tolerated MLD to RUE/ RUQ to address distal lymphedema and, myofacial release for AWS without difficulty today. Stubborn cording persists at axilla , especially at border of pectoralis and at antecubital fossa.  We were able to palpate cords in volar forarm again today, which suggests stretching thus far may be allowing cording to move more to surface of skin where we are able to work on these tissues more dirrectly with manual therapy. Hand swelling persists, but is less dense than initially. Stubborn swelling noted at MPs primarily.Cont as per POC.    OT Occupational Profile and History   Comprehensive Assessment- Review of records and extensive additional review of physical, cognitive, psychosocial history related to current functional performance    Occupational performance deficits (Please refer to evaluation for details):  ADL's;IADL's;Social Participation;Leisure;Rest and Sleep;Work    Marketing executive / Function / Physical Skills  ADL;Decreased knowledge of precautions;ROM;UE functional use;Decreased knowledge of use of DME;Edema;Skin integrity;Pain;IADL    Rehab Potential  Good    Clinical Decision Making  Several treatment options, min-mod task modification necessary    Comorbidities Affecting Occupational Performance:  Presence of comorbidities impacting occupational performance    Comorbidities impacting occupational performance description:  see SUBJUECTIVE    Modification or Assistance to Complete Evaluation   Min-Moderate modification of tasks or assist with assess necessary to complete eval    OT Frequency  2x / week    OT Duration  12 weeks    OT Treatment/Interventions  Self-care/ADL training;Therapeutic exercise;Manual lymph drainage;Patient/family education;Therapeutic activities;DME and/or AE instruction;Manual Therapy;Other (comment)   consider fitting with prophylactic cmopression arm sleeve   Plan  BUE AROM ther ex to reduce axillary cording- 2 sets of 10 2-3 x daily    Consulted and Agree with Plan of Care  Patient       Patient will benefit from skilled therapeutic intervention in order to improve the following deficits and impairments:   Body Structure / Function / Physical Skills: ADL, Decreased knowledge of precautions, ROM, UE functional use, Decreased knowledge of use of DME, Edema, Skin integrity, Pain, IADL       Visit Diagnosis: Post-mastectomy lymphedema syndrome    Problem List Patient Active Problem List   Diagnosis Date Noted  . Edema 04/16/2019  . Postoperative wound infection 03/13/2019  . Seroma of breast 03/11/2019  . Age-related  osteoporosis without current pathological fracture 02/27/2019  . Malignant neoplasm of central portion of right breast in female, estrogen receptor positive ([place]) 02/12/2019  . Genetic testing 01/31/2019  . Malignant neoplasm of upper-inner quadrant of right breast in female, estrogen receptor positive ([place]) 01/15/2019  . Long term current use of opiate analgesic 12/02/2018  . Tympanic membrane central perforation, left 11/04/2018  . DDD (degenerative disc disease), lumbosacral 10/10/2018  . Abnormal MRI, lumbar spine (08/29/2018) 10/10/2018  . 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 Lumbar nerve root impingement (Bilateral: L4 & L5) (L>R: L5) 10/10/2018  . Lumbar Spinal instability of L4-5 10/10/2018  . Lumbar facet hypertrophy 10/10/2018  . COPD (chronic obstructive pulmonary  disease) with emphysema ([place]) 08/28/2018  . Elevated rheumatoid factor 08/13/2018  . Elevated C-reactive protein (CRP) 08/12/2018  . Elevated sed rate 08/12/2018  . Lumbar facet syndrome (Bilateral) 08/12/2018  . Back pain with left-sided radiculopathy 08/12/2018  . DDD (degenerative disc disease), lumbar 08/12/2018  . Chronic lower extremity pain (Primary Area of Pain) (Bilateral) (L>R) 07/23/2018  . Chronic low back pain (Secondary Area of Pain) (Bilateral) (L>R) w/ sciatica (Bilateral) 07/23/2018  . Chronic pain syndrome 07/23/2018  . Pharmacologic therapy 07/23/2018  . Disorder of skeletal system 07/23/2018  . Problems influencing health status 07/23/2018  . Chronic sacroiliac joint pain [facility] Area of Pain) (Bilateral) (L>R) 07/23/2018  . Status post lumboperitoneal shunt placement 02/26/2018  . 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 Tympanic membrane perforation 05/30/2016  . PMB (postmenopausal bleeding) 04/26/2016  . Rash 12/27/2015  . 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 Plantar  fasciitis of foot (Left) 05/26/2015  . Spondylosis of lumbar region without myelopathy or radiculopathy 05/26/2015  . Lumbar foraminal stenosis (L3-4, L4-5, L5-S1) (L>R) 05/26/2015  . Spinal stenosis of lumbar region with neurogenic claudication 05/26/2015  . Encounter for screening mammogram for malignant neoplasm of breast 12/02/2014  . Goiter 12/02/2014  . Chronic idiopathic gout of foot (Right) 07/06/2014  . Biceps tendonitis (Right) 05/19/2014  . Shoulder pain (Right) 05/19/2014  . Facial weakness 05/05/2014  . Post herpetic neuralgia 05/05/2014  . Chronic dysfunction of left eustachian tube 03/24/2014  . Osteopenia 03/13/2014  . Lumbar Grade 1 Anterolisthesis of L3/4 (75m) & L4/5 (4-12m(w/ Dynamic instability) 03/13/2014  . Family history of colon cancer 12/02/2013  . Dyshidrotic eczema 11/17/2013  . Diastasis recti 04/08/2013  . Facial nerve paresis 07/31/2012  . Blepharospasm 05/08/2012  . 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 Obesity 03/28/2012  . Hemoptysis 12/22/2011  . Neuropathic postherpetic trigeminal neuralgia 12/12/2011  . Allergic rhinitis, mild 10/12/2011  . Disequilibrium syndrome 09/27/2011  . Chronic bronchitis (HCDavis09/24/2012  . Essential hypertension 05/24/2011  . Type 2 diabetes mellitus without complications (HCMaple Park0979/15/0569. Pulmonary nodules 05/05/2011    ThAndrey SpearmanMS, OTR/L, CLKau Hospital1/17/20 4:06 PM  Co[place]verAIN REChi Lisbon HealthERVICES [address]dHatfieldNC[place]aska[address]hone: [phone] Fax:  [phone]: Susan RamakerRN: [id number] of Birth: 6/October 21, 1950

## 2019-07-30 ENCOUNTER — Other Ambulatory Visit: Payer: Self-pay

## 2019-07-30 ENCOUNTER — Ambulatory Visit: Payer: Medicare Other

## 2019-07-30 ENCOUNTER — Ambulatory Visit: Payer: Medicare Other | Admitting: Occupational Therapy

## 2019-07-30 DIAGNOSIS — M6281 Muscle weakness (generalized): Secondary | ICD-10-CM

## 2019-07-30 DIAGNOSIS — G8929 Other chronic pain: Secondary | ICD-10-CM

## 2019-07-30 DIAGNOSIS — R2689 Other abnormalities of gait and mobility: Secondary | ICD-10-CM

## 2019-07-30 DIAGNOSIS — R2681 Unsteadiness on feet: Secondary | ICD-10-CM

## 2019-07-30 DIAGNOSIS — I972 Postmastectomy lymphedema syndrome: Secondary | ICD-10-CM

## 2019-07-30 NOTE — Therapy (Signed)
[place] MAIN [facility] SERVICES [address] [place], [place], [address] Phone: [phone]   Fax:  [phone]  Occupational Therapy Treatment  Patient Details  Name: Susan Callahan MRN: [id number] Date of Birth: Mar 29, 1951 Referring Provider (OT): Humberto Leep, MD   Encounter Date: 07/30/2019  OT End of Session - 07/30/19 1622    Visit Number  47    Number of Visits  72    Date for OT Re-Evaluation  09/18/19    OT Start Time  0205    OT Stop Time  0310    OT Time Calculation (min)  65 min    Activity Tolerance  Patient tolerated treatment well;No increased pain    Behavior During Therapy  WFL for tasks assessed/performed       Past Medical History:  Diagnosis Date  . COPD (chronic obstructive pulmonary disease) ([place])   . Depression 1974  . Diabetes mellitus without complication ([place])   . Hypertension   . Migraine   . 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 LUMBAR PERITONEAL SHUNT    . TYMPANOSTOMY TUBE PLACEMENT      There were no vitals filed for this visit.  Subjective Assessment - 07/30/19 1509    Subjective   Pt presents for OT visit 47/72 (40) to address RUE/ RUQ post-mastectomy lymphedema and AWS. Pt reports pain of 3/10 in R axilla, " pulling and soreness".    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 01/30/19 partial mastectomy. Margins clear. 0/2 LN  negative for  disease. actinic keritosis,DDD, DM, obesity, thyroid nodules, pulmonary nodules, chronic back pain, HTN, COPD, hx depression    Limitations  pain and swelling in R axilla and arm, chronic back pain, muscle weakness, impaired balance, decreased RUE AROM, abnormal gait, difficulty walking, impaired functional mobility and transfers,    Repetition  Increases Symptoms    Special Tests  -Stemmer at MPs bilaterally    Patient Stated Goals  relduce pain and improve  movement  in my R arm so I can do things I need to do. reduce risk of lymphedema    Pain Onset  More than a month ago    Pain Onset  More than a month ago                   OT Treatments/Exercises (OP) - 07/30/19 0001      ADLs   ADL Education Given  Yes      Manual Therapy   Manual Therapy  Edema management    Manual Lymphatic Drainage (MLD)  MLD to RUE/RUQ as established using cntalateral AAA    Compression Bandaging  Pt donns ustom compression glove and sleeve with modified independence after session using nylon slippy and friction gloves.             OT Education - 07/30/19 1622    Education Details  Cont Pt edu for LE self care throughout session. Excellent return.    Person(s) Educated  Patient    Methods  Explanation;Handout;Demonstration    Comprehension  Verbalized understanding;Returned demonstration          OT Long Term Goals - 06/19/19 1400      OT LONG TERM GOAL #5   Status  Unable to assess   garments have not been delivered for fitting yet     OT LONG TERM GOAL #6  Baseline  Max A    Time  12    Period  Weeks    Status  Partially Met    Target Date  08/31/19            Plan - 07/30/19 1623    Clinical Impression Statement  Emphasis of session on MLD today to reduce inflammation due to persistent and oainful axillary web syndrom. Hand swelling remains unchanged for tyhe past 2 weeks. Cording , palpable at axilla and antecubital fossa also remains unchanged for the most part. Pt continues to tolerate all custom compression. She is compliant with all home program/ self care protocols. Cont as per POC up to and after consult with plastic surgeon at [facility] and PRN post sx.    OT Occupational Profile and History  Comprehensive Assessment- Review of records and extensive additional review of physical, cognitive, psychosocial history related to current functional performance    Occupational performance deficits (Please refer to evaluation  for details):  ADL's;IADL's;Social Participation;Leisure;Rest and Sleep;Work    Marketing executive / Function / Physical Skills  ADL;Decreased knowledge of precautions;ROM;UE functional use;Decreased knowledge of use of DME;Edema;Skin integrity;Pain;IADL    Rehab Potential  Good    Clinical Decision Making  Several treatment options, min-mod task modification necessary    Comorbidities Affecting Occupational Performance:  Presence of comorbidities impacting occupational performance    Comorbidities impacting occupational performance description:  see SUBJUECTIVE    Modification or Assistance to Complete Evaluation   Min-Moderate modification of tasks or assist with assess necessary to complete eval    OT Frequency  2x / week    OT Duration  12 weeks    OT Treatment/Interventions  Self-care/ADL training;Therapeutic exercise;Manual lymph drainage;Patient/family education;Therapeutic activities;DME and/or AE instruction;Manual Therapy;Other (comment)   consider fitting with prophylactic cmopression arm sleeve   Plan  BUE AROM ther ex to reduce axillary cording- 2 sets of 10 2-3 x daily    Consulted and Agree with Plan of Care  Patient       Patient will benefit from skilled therapeutic intervention in order to improve the following deficits and impairments:   Body Structure / Function / Physical Skills: ADL, Decreased knowledge of precautions, ROM, UE functional use, Decreased knowledge of use of DME, Edema, Skin integrity, Pain, IADL       Visit Diagnosis: Post-mastectomy lymphedema syndrome    Problem List Patient Active Problem List   Diagnosis Date Noted  . Edema 04/16/2019  . Postoperative wound infection 03/13/2019  . Seroma of breast 03/11/2019  . Age-related osteoporosis without current pathological fracture 02/27/2019  . Malignant neoplasm of central portion of right breast in female, estrogen receptor positive ([place]) 02/12/2019  . Genetic testing 01/31/2019  . Malignant neoplasm  of upper-inner quadrant of right breast in female, estrogen receptor positive ([place]) 01/15/2019  . Long term current use of opiate analgesic 12/02/2018  . Tympanic membrane central perforation, left 11/04/2018  . DDD (degenerative disc disease), lumbosacral 10/10/2018  . Abnormal MRI, lumbar spine (08/29/2018) 10/10/2018  . 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 Lumbar nerve root impingement (Bilateral: L4 & L5) (L>R: L5) 10/10/2018  . Lumbar Spinal instability of L4-5 10/10/2018  . Lumbar facet hypertrophy 10/10/2018  . COPD (chronic obstructive pulmonary disease) with emphysema ([place]) 08/28/2018  . Elevated rheumatoid factor 08/13/2018  . Elevated C-reactive protein (CRP) 08/12/2018  . Elevated sed rate 08/12/2018  . Lumbar facet syndrome (Bilateral) 08/12/2018  . Back pain with left-sided radiculopathy 08/12/2018  .  DDD (degenerative disc disease), lumbar 08/12/2018  . Chronic lower extremity pain (Primary Area of Pain) (Bilateral) (L>R) 07/23/2018  . Chronic low back pain (Secondary Area of Pain) (Bilateral) (L>R) w/ sciatica (Bilateral) 07/23/2018  . Chronic pain syndrome 07/23/2018  . Pharmacologic therapy 07/23/2018  . Disorder of skeletal system 07/23/2018  . Problems influencing health status 07/23/2018  . Chronic sacroiliac joint pain [facility] Area of Pain) (Bilateral) (L>R) 07/23/2018  . Status post lumboperitoneal shunt placement 02/26/2018  . 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 Tympanic membrane perforation 05/30/2016  . PMB (postmenopausal bleeding) 04/26/2016  . Rash 12/27/2015  . 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 Plantar fasciitis of foot (Left) 05/26/2015  . Spondylosis of lumbar region without myelopathy or radiculopathy 05/26/2015  . Lumbar foraminal stenosis (L3-4, L4-5, L5-S1) (L>R) 05/26/2015  . Spinal stenosis of lumbar region with  neurogenic claudication 05/26/2015  . Encounter for screening mammogram for malignant neoplasm of breast 12/02/2014  . Goiter 12/02/2014  . Chronic idiopathic gout of foot (Right) 07/06/2014  . Biceps tendonitis (Right) 05/19/2014  . Shoulder pain (Right) 05/19/2014  . Facial weakness 05/05/2014  . Post herpetic neuralgia 05/05/2014  . Chronic dysfunction of left eustachian tube 03/24/2014  . Osteopenia 03/13/2014  . Lumbar Grade 1 Anterolisthesis of L3/4 (80m) & L4/5 (4-174m(w/ Dynamic instability) 03/13/2014  . Family history of colon cancer 12/02/2013  . Dyshidrotic eczema 11/17/2013  . Diastasis recti 04/08/2013  . Facial nerve paresis 07/31/2012  . Blepharospasm 05/08/2012  . 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 Obesity 03/28/2012  . Hemoptysis 12/22/2011  . Neuropathic postherpetic trigeminal neuralgia 12/12/2011  . Allergic rhinitis, mild 10/12/2011  . Disequilibrium syndrome 09/27/2011  . Chronic bronchitis (HCValeria09/24/2012  . Essential hypertension 05/24/2011  . Type 2 diabetes mellitus without complications (HCCorvallis0994/58/5929. Pulmonary nodules 05/05/2011    ThAndrey SpearmanMS, OTR/L, CLKindred Hospital East Houston1/18/20 4:26 PM   CoSumnerAIN REAbbott Northwestern HospitalERVICES [address]dCanovanasNCAlaska[address]hone: [phone] Fax:  [phone]: Susan AlcottRN: [id number] of Birth: 02/1951/07/05

## 2019-07-30 NOTE — Therapy (Signed)
[place] MAIN [facility] SERVICES [address] [place], [place], [address] Phone: [phone]   Fax:  [phone]  Physical Therapy Treatment  Patient Details  Name: Susan Callahan MRN: [id number] Date of Birth: 08-Nov-1950 No data recorded  Encounter Date: 07/30/2019  PT End of Session - 07/31/19 0956    Visit Number  67    Number of Visits  72    Date for PT Re-Evaluation  08/04/19    Authorization Type  8/10 Pn 10/6    PT Start Time  1517    PT Stop Time  1559    PT Time Calculation (min)  42 min    Equipment Utilized During Treatment  Gait belt    Activity Tolerance  Patient tolerated treatment well    Behavior During Therapy  WFL for tasks assessed/performed       Past Medical History:  Diagnosis Date  . COPD (chronic obstructive pulmonary disease) ([place])   . Depression 1974  . Diabetes mellitus without complication ([place])   . Hypertension   . Migraine   . 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 LUMBAR PERITONEAL SHUNT    . TYMPANOSTOMY TUBE PLACEMENT      There were no vitals filed for this visit.  Subjective Assessment - 07/30/19 1550    Subjective  Patient reports having some back pain with radiating sciatica due to overdoing it gardening and at the gym. Has been compliant with HEP.    Pertinent History  Patient reports balance issues began in 2008. 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  Balance deficit has been constant since 2008, some days are worse than others but is always present to patient. Does not notice a directionality of loss of balance. Patient reports her balance is affecting her standing, walking on uneven surfaces such as outside, trying to walk with dim light, showering and ADLs. Slight visual deficit of R lower quadrant. 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 Has nystagmus when looking to the R.  Back pain began in 2007 and  has gradually worsened to nonstop pain. Medication helps briefly. Patient is challenged with standing with knees extended due to back pain.    Limitations  Lifting;Standing;Walking;House hold activities    How long can you stand comfortably?  pain begins immediately     How long can you walk comfortably?  pain begins immediately     Patient Stated Goals  walk better, have less pain, be able to walk in the grocery store with head turns to scan shelves    Currently in Pain?  Yes    Pain Score  3     Pain Location  Back    Pain Orientation  Lower;Left    Pain Descriptors / Indicators  Aching;Shooting    Pain Type  Chronic pain    Pain Onset  In the past 7 days    Pain Frequency  Intermittent             Prone:  Roller to thoracolumbar paraspinals, piriformis, hamstrings, and calves x30mnutes   Hooklying supine: GTB abductionwith posterior pelvic tilt 15x Hamstring stretch 60 seconds each LE x2 trials, popliteal angle stretch 60 seconds x 2 trials each LE opp knee to chest with PT overpressure for piriformis targeting 2x 30 seconds each LE Nerve glides 20x each side with straight leg, leg on PT shoulder  GTB marching with posterior  pelvic tilts 12x each LE. Piriformis stretch with SAD inferior glides with belt 7x 20 second holds each LE TrA activation with swiss ball pressing ball between knees and hands 12x 3 second holds  Dead bug TrA contraction with opp LE/UE raises 10x with swiss ball    Pt educated throughout session about proper posture and technique with exercises. Improved exercise technique, movement at target joints, use of target muscles after min to mod verbal, visual, tactile cues    Patient presents with radiating pain through left sciatic nerve pathway that improved with repeated prolonged hold and rollout. Gentle abdominal strengthening interventions performed with decreasing pain with patient reporting no symptoms by end of session. Patient will benefit from  skilled physical therapy to increase balance, improve strength, decrease pain, and improve functional capacity of mobility for improved quality of life          PT Education - 07/31/19 0955    Education Details  exercise technique, body mechanics,    Person(s) Educated  Patient    Methods  Explanation;Demonstration;Tactile cues;Verbal cues    Comprehension  Verbalized understanding;Returned demonstration;Verbal cues required;Tactile cues required       PT Short Term Goals - 07/07/19 1217      PT SHORT TERM GOAL #1   Title  Patient will be independent with completion of HEP to improve ability to complete functional tasks and functional ADLs.    Baseline  10/23: will give HEP next session 11/19: HEP compliance    Time  2    Period  Weeks    Status  Achieved      PT SHORT TERM GOAL #2   Title  Patient will report pain score less than 4/10 on VAS to demonstrate reduction of pain levels with functional tasks.     Baseline  10/23: 6/10 11/19: 5/10 12/12: 9/10 1/10: 7/10  2/11: 3/10     Time  2    Period  Weeks    Status  Achieved        PT Long Term Goals - 07/07/19 1108      PT LONG TERM GOAL #1   Title  Patient will report pain score of <3/10 on VAS for decreased pain levels and ease with functional activities.    Baseline  10/23: 6/10 11/19: 5/10 12/12 9/10 1/10: 7/10 2/11: 3/10 3/9: 6/10  5/26: 8/10 03/17/19: 7/10 8/3: 5/10 8/25: 4/10 9/21: 4/10 10/26: 4/10    Time  4    Period  Weeks    Status  Partially Met    Target Date  08/04/19      PT LONG TERM GOAL #2   Title  Patient will be able to ambulate on treadmill for 2mnutes without increase in back/leg pain demonstrating improved community ambulation and treadmill walking at pulmonary rehab    Baseline  10/23: 063mutes increases pain 11/19: 68m20mtes 12/12: has not had the chance to perform 1/10: was able to do 5 minutes without pain 2/11: able to do 5 minutes then had to bend over 3/9: 5 minutes 30 seconds at 2.0 mphs  5/26: had to walk 10 minutes at cancer center with extreme pain but was able to perform 7/6: walks 10 minutes with increase of pain by 2 points 8/3: able to perform with bent over posture 9/21: able to perform 7 minutes with intervals of 1-2% incline    Time  4    Period  Weeks    Status  Achieved  PT LONG TERM GOAL #3   Title  Patient will increase dynamic gait index score to >19/24 as to demonstrate reduced fall risk and improved dynamic gait balance for better safety with community/home ambulation.    Baseline  10/1: 11/24 10/26: 18/24    Time  4    Period  Weeks    Status  Partially Met    Target Date  08/04/19      PT LONG TERM GOAL #4   Title  Patient will score 4+/5 on BLE MMT to demonstrate improved glute strength and ability to carry groceries with increased ease up/down stairs at home.    Baseline  10/23: 4-/5 grossly 11/19: 4/5 grossly; hamstrings 4-/5 12/12: 4/5 gross 1/10: 4/5 gross 2/11: gross 4+/5 with hamstrings and abuductors 4-/5 3/9: gross 4+/5 hamstrings and abductors 4-/5 5/27: grossly 3+/5 with pain with LLE movements 7/6: grossly 4-/5 with pain with LLE movement 8/3: deferred due to swelling/pain 8/25: grossly 4/5 without pain 9/21: grossly 4/5 with hamstrings not tested due to spasm    Time  4    Period  Weeks    Status  Partially Met    Target Date  08/04/19      PT LONG TERM GOAL #5   Title  Patient will increase six minute walk test distance to >1000 with minimal increase of pain for progression to community ambulator and improve gait ability    Baseline  10/1:580 ft stopped at 3: 58 due to excessive pain 10/26: 950 ft    Time  4    Period  Weeks    Status  Partially Met    Target Date  08/04/19      PT LONG TERM GOAL #6   Title  Patient will be able to make and/or strip the bed without pain and without needing rest breaks to perform iADLs independently.     Baseline  2/11: pain limits ability to perform, ~4 breaks required 3/9: takes her time, 3 breaks  5/27: very painful still 7/6: requires only one rest break 8/3: able to perform with +2 pain increase 8/25: no rest breaks required, increased pain 4 points 9/21: able to perform with +1-2 symptoms 10/26: able to perform with only occasional symptoms.    Time  4    Period  Weeks    Status  Achieved      PT LONG TERM GOAL #7   Title  Patient will have decreased modified oswestry score <20% for improved ability to complete household tasks with less pain levels.     Baseline  2/11: 30%  3/9: 30% 5/27: 63% 7/6: 38% 8/3: 42% 8/25: 36% 9/21: 30% 10/26: 26%    Time  4    Period  Weeks    Status  Partially Met    Target Date  08/04/19      PT LONG TERM GOAL #8   Title  Patient will increase Berg Balance score by > 6 points (50/56) to demonstrate decreased fall risk during functional activities.    Baseline  10/1: 44/56 10/26: 49/56    Time  4    Period  Weeks    Status  Partially Met    Target Date  08/04/19            Plan - 07/31/19 0959    Clinical Impression Statement  Patient presents with radiating pain through left sciatic nerve pathway that improved with repeated prolonged hold and rollout. Gentle abdominal strengthening interventions performed with decreasing  pain with patient reporting no symptoms by end of session. Patient will benefit from skilled physical therapy to increase balance, improve strength, decrease pain, and improve functional capacity of mobility for improved quality of life    Personal Factors and Comorbidities  Age;Comorbidity 3+;Fitness;Finances;Past/Current Experience;Social Background;Time since onset of injury/illness/exacerbation    Comorbidities  COPD, Ramsay Hunt Syndrome, HTN, DM, lymphedema, breast cancer    Examination-Activity Limitations  Bathing;Bend;Carry;Dressing;Hygiene/Grooming;Stairs;Squat;Reach Overhead;Locomotion Level;Lift;Stand;Transfers    Examination-Participation Restrictions  Cleaning;Community Activity;Laundry;Volunteer;Shop;Personal  Finances;Yard Work    Merchant navy officer  Evolving/Moderate complexity    Rehab Potential  Fair    PT Frequency  2x / week    PT Duration  4 weeks    PT Treatment/Interventions  ADLs/Self Care Home Management;Cryotherapy;Moist Heat;Traction;Iontophoresis 61m/ml Dexamethasone;Ultrasound;Gait training;Stair training;Functional mobility training;Neuromuscular re-education;Balance training;Therapeutic exercise;Therapeutic activities;Patient/family education;Manual techniques;Energy conservation;Aquatic Therapy;Electrical Stimulation;Passive range of motion;Dry needling;Taping;Vestibular;Canalith Repostioning    PT Next Visit Plan  VOR, walking with head turns    PT Home Exercise Plan  VOR    Consulted and Agree with Plan of Care  Patient       Patient will benefit from skilled therapeutic intervention in order to improve the following deficits and impairments:  Abnormal gait, Cardiopulmonary status limiting activity, Decreased activity tolerance, Decreased balance, Decreased endurance, Decreased range of motion, Difficulty walking, Decreased strength, Hypomobility, Decreased mobility, Impaired perceived functional ability, Impaired flexibility, Postural dysfunction, Obesity, Improper body mechanics, Pain, Dizziness, Increased edema  Visit Diagnosis: Chronic low back pain, unspecified back pain laterality, unspecified whether sciatica present  Muscle weakness (generalized)  Other abnormalities of gait and mobility  Unsteadiness on feet     Problem List Patient Active Problem List   Diagnosis Date Noted  . Edema 04/16/2019  . Postoperative wound infection 03/13/2019  . Seroma of breast 03/11/2019  . Age-related osteoporosis without current pathological fracture 02/27/2019  . Malignant neoplasm of central portion of right breast in female, estrogen receptor positive ([place] 02/12/2019  . Genetic testing 01/31/2019  . Malignant neoplasm of upper-inner quadrant of right breast  in female, estrogen receptor positive ([place] 01/15/2019  . Long term current use of opiate analgesic 12/02/2018  . Tympanic membrane central perforation, left 11/04/2018  . DDD (degenerative disc disease), lumbosacral 10/10/2018  . Abnormal MRI, lumbar spine (08/29/2018) 10/10/2018  . 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 Lumbar nerve root impingement (Bilateral: L4 & L5) (L>R: L5) 10/10/2018  . Lumbar Spinal instability of L4-5 10/10/2018  . Lumbar facet hypertrophy 10/10/2018  . COPD (chronic obstructive pulmonary disease) with emphysema ([place]St. John 08/28/2018  . Elevated rheumatoid factor 08/13/2018  . Elevated C-reactive protein (CRP) 08/12/2018  . Elevated sed rate 08/12/2018  . Lumbar facet syndrome (Bilateral) 08/12/2018  . Back pain with left-sided radiculopathy 08/12/2018  . DDD (degenerative disc disease), lumbar 08/12/2018  . Chronic lower extremity pain (Primary Area of Pain) (Bilateral) (L>R) 07/23/2018  . Chronic low back pain (Secondary Area of Pain) (Bilateral) (L>R) w/ sciatica (Bilateral) 07/23/2018  . Chronic pain syndrome 07/23/2018  . Pharmacologic therapy 07/23/2018  . Disorder of skeletal system 07/23/2018  . Problems influencing health status 07/23/2018  . Chronic sacroiliac joint pain ([facility]) (Bilateral) (L>R) 07/23/2018  . Status post lumboperitoneal shunt placement 02/26/2018  . 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 Tympanic membrane perforation 05/30/2016  . PMB (postmenopausal bleeding) 04/26/2016  . Rash 12/27/2015  . 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 Plantar fasciitis  of foot (Left) 05/26/2015  . Spondylosis of lumbar region without myelopathy or radiculopathy 05/26/2015  . Lumbar foraminal stenosis (L3-4, L4-5, L5-S1) (L>R) 05/26/2015  . Spinal stenosis of lumbar region with neurogenic claudication 05/26/2015  .  Encounter for screening mammogram for malignant neoplasm of breast 12/02/2014  . Goiter 12/02/2014  . Chronic idiopathic gout of foot (Right) 07/06/2014  . Biceps tendonitis (Right) 05/19/2014  . Shoulder pain (Right) 05/19/2014  . Facial weakness 05/05/2014  . Post herpetic neuralgia 05/05/2014  . Chronic dysfunction of left eustachian tube 03/24/2014  . Osteopenia 03/13/2014  . Lumbar Grade 1 Anterolisthesis of L3/4 (40m) & L4/5 (4-142m(w/ Dynamic instability) 03/13/2014  . Family history of colon cancer 12/02/2013  . Dyshidrotic eczema 11/17/2013  . Diastasis recti 04/08/2013  . Facial nerve paresis 07/31/2012  . Blepharospasm 05/08/2012  . 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 Obesity 03/28/2012  . Hemoptysis 12/22/2011  . Neuropathic postherpetic trigeminal neuralgia 12/12/2011  . Allergic rhinitis, mild 10/12/2011  . Disequilibrium syndrome 09/27/2011  . Chronic bronchitis (HCWagoner09/24/2012  . Essential hypertension 05/24/2011  . Type 2 diabetes mellitus without complications (HCMonticello0998/06/2547. Pulmonary nodules 05/05/2011   MaJanna ArchPT, DPT   07/31/2019, 10:01 AM  CoLewis and ClarkAIN RERegency Hospital Of Northwest ArkansasERVICES [address]dMcRaeNCAlaska[address]hone: [phone] Fax:  [phone]: LiKaoru BendaRN: [id number] of Birth: [birth year]

## 2019-08-04 ENCOUNTER — Ambulatory Visit: Payer: Medicare Other

## 2019-08-04 ENCOUNTER — Other Ambulatory Visit: Payer: Self-pay

## 2019-08-04 ENCOUNTER — Ambulatory Visit: Payer: Medicare Other | Admitting: Occupational Therapy

## 2019-08-04 DIAGNOSIS — R2681 Unsteadiness on feet: Secondary | ICD-10-CM

## 2019-08-04 DIAGNOSIS — M6281 Muscle weakness (generalized): Secondary | ICD-10-CM

## 2019-08-04 DIAGNOSIS — G8929 Other chronic pain: Secondary | ICD-10-CM

## 2019-08-04 DIAGNOSIS — I972 Postmastectomy lymphedema syndrome: Secondary | ICD-10-CM

## 2019-08-04 DIAGNOSIS — R2689 Other abnormalities of gait and mobility: Secondary | ICD-10-CM

## 2019-08-04 NOTE — Therapy (Signed)
[place] MAIN [facility] SERVICES [address] [place], [place], [address] Phone: [phone]   Fax:  [phone]  Physical Therapy Treatment/ [facility]ERT  Patient Details  Name: Susan Callahan MRN: [id number] Date of Birth: [birth year] No data recorded  Encounter Date: 08/04/2019  PT End of Session - 08/04/19 1534    Visit Number  31    Number of Visits  72    Date for PT Re-Evaluation  09/01/19    Authorization Type  9/10 Pn 10/6    PT Start Time  1517    PT Stop Time  1559    PT Time Calculation (min)  42 min    Equipment Utilized During Treatment  Gait belt    Activity Tolerance  Patient tolerated treatment well    Behavior During Therapy  WFL for tasks assessed/performed       Past Medical History:  Diagnosis Date  . COPD (chronic obstructive pulmonary disease) ([place])   . Depression 1974  . Diabetes mellitus without complication ([place])   . Hypertension   . Migraine   . 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 LUMBAR PERITONEAL SHUNT    . TYMPANOSTOMY TUBE PLACEMENT      There were no vitals filed for this visit.  Subjective Assessment - 08/04/19 1800    Subjective  Patient reports compliance with HEP, has been exercising more frequently and noticing improved balance.    Pertinent History  Patient reports balance issues began in 2008. 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  Balance deficit has been constant since 2008, some days are worse than others but is always present to patient. Does not notice a directionality of loss of balance. Patient reports her balance is affecting her standing, walking on uneven surfaces such as outside, trying to walk with dim light, showering and ADLs. Slight visual deficit of R lower quadrant. 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 Has nystagmus when looking to the R.  Back pain began in 2007 and has gradually worsened  to nonstop pain. Medication helps briefly. Patient is challenged with standing with knees extended due to back pain.    Limitations  Lifting;Standing;Walking;House hold activities    How long can you stand comfortably?  pain begins immediately     How long can you walk comfortably?  pain begins immediately     Patient Stated Goals  walk better, have less pain, be able to walk in the grocery store with head turns to scan shelves    Currently in Pain?  Yes    Pain Score  1     Pain Location  Back    Pain Orientation  Lower    Pain Descriptors / Indicators  Aching    Pain Type  Chronic pain    Pain Onset  In the past 7 days    Pain Frequency  Intermittent         [facility] PT Assessment - 08/04/19 0001      Standardized Balance Assessment   Standardized Balance Assessment  Berg Balance Test;Dynamic Gait Index      Berg Balance Test   Sit to Stand  Able to stand without using hands and stabilize independently    Standing Unsupported  Able to stand safely 2 minutes    Sitting with Back Unsupported but Feet Supported on Floor or Stool  Able to sit safely and securely 2 minutes  Stand to Sit  Sits safely with minimal use of hands    Transfers  Able to transfer safely, minor use of hands    Standing Unsupported with Eyes Closed  Able to stand 10 seconds safely    Standing Unsupported with Feet Together  Able to place feet together independently and stand 1 minute safely    From Standing, Reach Forward with Outstretched Arm  Can reach confidently >25 cm (10")    From Standing Position, Pick up Object from Floor  Able to pick up shoe safely and easily    From Standing Position, Turn to Look Behind Over each Shoulder  Looks behind from both sides and weight shifts well    Turn 360 Degrees  Able to turn 360 degrees safely in 4 seconds or less    Standing Unsupported, Alternately Place Feet on Step/Stool  Able to stand independently and safely and complete 8 steps in 20 seconds    Standing  Unsupported, One Foot in Front  Able to plae foot ahead of the other independently and hold 30 seconds    Standing on One Leg  Able to lift leg independently and hold 5-10 seconds    Total Score  54      Dynamic Gait Index   Level Surface  Normal    Change in Gait Speed  Normal    Gait with Horizontal Head Turns  Mild Impairment    Gait with Vertical Head Turns  Mild Impairment    Gait and Pivot Turn  Normal    Step Over Obstacle  Normal    Step Around Obstacles  Normal    Steps  Mild Impairment    Total Score  21          Goals:  VAS: 3/10  DGI: 21/24  MMT 6 MWT: 982 ft   MODI : 16% BERG: 54/ 56   Roller to thoracolumbar paraspinals, piriformis, hamstrings, and calves x30mnutes Pt educated throughout session about proper posture and technique with exercises. Improved exercise technique, movement at target joints, use of target muscles after min to mod verbal, visual, tactile cues  Decrease frequency to 1x/week  Due to patient's progression towards goal and increased ability to control symptoms patient will benefit from a decrease in frequency to 1x/week for one more certification. She is improving in her pain levels and frequency of high levels of pain, improving with her stability in static and dynamic motion as can be seen in her improved scores of DGI and BERG. Her 6 MWT continues to progress indicating improved capacity for mobility at this time. generally predictable time.Patient will benefit from skilled physical therapy to increase balance, improve strength, decrease pain, and improve functional capacity of mobility for improved quality of life.               PT Education - 08/04/19 1536    Education Details  goals, exercise technique, POC    Person(s) Educated  Patient    Methods  Explanation;Demonstration;Tactile cues;Verbal cues    Comprehension  Verbalized understanding;Returned demonstration;Verbal cues required;Tactile cues required       PT  Short Term Goals - 07/07/19 1217      PT SHORT TERM GOAL #1   Title  Patient will be independent with completion of HEP to improve ability to complete functional tasks and functional ADLs.    Baseline  10/23: will give HEP next session 11/19: HEP compliance    Time  2    Period  Weeks  Status  Achieved      PT SHORT TERM GOAL #2   Title  Patient will report pain score less than 4/10 on VAS to demonstrate reduction of pain levels with functional tasks.     Baseline  10/23: 6/10 11/19: 5/10 12/12: 9/10 1/10: 7/10  2/11: 3/10     Time  2    Period  Weeks    Status  Achieved        PT Long Term Goals - 08/04/19 1533      PT LONG TERM GOAL #1   Title  Patient will report pain score of <3/10 on VAS for decreased pain levels and ease with functional activities.    Baseline  10/23: 6/10 11/19: 5/10 12/12 9/10 1/10: 7/10 2/11: 3/10 3/9: 6/10  5/26: 8/10 03/17/19: 7/10 8/3: 5/10 8/25: 4/10 9/21: 4/10 10/26: 4/10 11/23: 3/10    Time  4    Period  Weeks    Status  Partially Met    Target Date  09/01/19      PT LONG TERM GOAL #2   Title  Patient will be able to ambulate on treadmill for 15mnutes without increase in back/leg pain demonstrating improved community ambulation and treadmill walking at pulmonary rehab    Baseline  10/23: 084mutes increases pain 11/19: 34m234mtes 12/12: has not had the chance to perform 1/10: was able to do 5 minutes without pain 2/11: able to do 5 minutes then had to bend over 3/9: 5 minutes 30 seconds at 2.0 mphs 5/26: had to walk 10 minutes at cancer center with extreme pain but was able to perform 7/6: walks 10 minutes with increase of pain by 2 points 8/3: able to perform with bent over posture 9/21: able to perform 7 minutes with intervals of 1-2% incline    Time  4    Period  Weeks    Status  Achieved      PT LONG TERM GOAL #3   Title  Patient will increase dynamic gait index score to >19/24 as to demonstrate reduced fall risk and improved dynamic gait balance  for better safety with community/home ambulation.    Baseline  10/1: 11/24 10/26: 18/24 11/23: 21/24    Time  4    Period  Weeks    Status  Achieved      PT LONG TERM GOAL #4   Title  Patient will score 4+/5 on BLE MMT to demonstrate improved glute strength and ability to carry groceries with increased ease up/down stairs at home.    Baseline  11/23: grossly 4+/5 with hip extension and adduction 4-/5 L df 4-/5 10/23: 4-/5 grossly 11/19: 4/5 grossly; hamstrings 4-/5 12/12: 4/5 gross 1/10: 4/5 gross 2/11: gross 4+/5 with hamstrings and abuductors 4-/5 3/9: gross 4+/5 hamstrings and abductors 4-/5 5/27: grossly 3+/5 with pain with LLE movements 7/6: grossly 4-/5 with pain with LLE movement 8/3: deferred due to swelling/pain 8/25: grossly 4/5 without pain 9/21: grossly 4/5 with hamstrings not tested due to spasm    Time  4    Period  Weeks    Status  Partially Met    Target Date  09/01/19      PT LONG TERM GOAL #5   Title  Patient will increase six minute walk test distance to >1000 with minimal increase of pain for progression to community ambulator and improve gait ability    Baseline  10/1:580 ft stopped at 3: 58 due to excessive pain 10/26: 950 ft 11/23:  982 ft    Time  4    Period  Weeks    Status  Partially Met    Target Date  09/01/19      Additional Long Term Goals   Additional Long Term Goals  Yes      PT LONG TERM GOAL #6   Title  Patient will be able to make and/or strip the bed without pain and without needing rest breaks to perform iADLs independently.     Baseline  2/11: pain limits ability to perform, ~4 breaks required 3/9: takes her time, 3 breaks 5/27: very painful still 7/6: requires only one rest break 8/3: able to perform with +2 pain increase 8/25: no rest breaks required, increased pain 4 points 9/21: able to perform with +1-2 symptoms 10/26: able to perform with only occasional symptoms.    Time  4    Period  Weeks    Status  Achieved      PT LONG TERM GOAL #7    Title  Patient will have decreased modified oswestry score <20% for improved ability to complete household tasks with less pain levels.     Baseline  2/11: 30%  3/9: 30% 5/27: 63% 7/6: 38% 8/3: 42% 8/25: 36% 9/21: 30% 10/26: 26% 11/23: 16%    Time  4    Period  Weeks    Status  Achieved      PT LONG TERM GOAL #8   Title  Patient will increase Berg Balance score by > 6 points (50/56) to demonstrate decreased fall risk during functional activities.    Baseline  10/1: 44/56 10/26: 49/56 11/23: 54/56    Time  4    Period  Weeks    Status  Achieved      PT LONG TERM GOAL  #9   TITLE  Patient will increase dynamic gait index score to >23/24 as to demonstrate reduced fall risk and improved dynamic gait balance for better safety with community/home ambulation.    Baseline  11/23: 21/ 24    Time  4    Period  Weeks    Status  New    Target Date  09/01/19            Plan - 08/04/19 1803    Clinical Impression Statement  Due to patient's progression towards goal and increased ability to control symptoms patient will benefit from a decrease in frequency to 1x/week for one more certification. She is improving in her pain levels and frequency of high levels of pain, improving with her stability in static and dynamic motion as can be seen in her improved scores of DGI and BERG. Her 6 MWT continues to progress indicating improved capacity for mobility at this time. generally predictable time.Patient will benefit from skilled physical therapy to increase balance, improve strength, decrease pain, and improve functional capacity of mobility for improved quality of life.    Personal Factors and Comorbidities  Age;Comorbidity 3+;Fitness;Finances;Past/Current Experience;Social Background;Time since onset of injury/illness/exacerbation    Comorbidities  COPD, Ramsay Hunt Syndrome, HTN, DM, lymphedema, breast cancer    Examination-Activity Limitations   Bathing;Bend;Carry;Dressing;Hygiene/Grooming;Stairs;Squat;Reach Overhead;Locomotion Level;Lift;Stand;Transfers    Examination-Participation Restrictions  Cleaning;Community Activity;Laundry;Volunteer;Shop;Personal Finances;Yard Work    [employer]  Evolving/Moderate complexity    Rehab Potential  Fair    PT Frequency  1x / week    PT Duration  4 weeks    PT Treatment/Interventions  ADLs/Self Care Home Management;Cryotherapy;Moist Heat;Traction;Iontophoresis 35m/ml Dexamethasone;Ultrasound;Gait training;Stair training;Functional mobility training;Neuromuscular re-education;Balance training;Therapeutic  exercise;Therapeutic activities;Patient/family education;Manual techniques;Energy conservation;Aquatic Therapy;Electrical Stimulation;Passive range of motion;Dry needling;Taping;Vestibular;Canalith Repostioning    PT Next Visit Plan  VOR, walking with head turns    PT Home Exercise Plan  VOR    Consulted and Agree with Plan of Care  Patient       Patient will benefit from skilled therapeutic intervention in order to improve the following deficits and impairments:  Abnormal gait, Cardiopulmonary status limiting activity, Decreased activity tolerance, Decreased balance, Decreased endurance, Decreased range of motion, Difficulty walking, Decreased strength, Hypomobility, Decreased mobility, Impaired perceived functional ability, Impaired flexibility, Postural dysfunction, Obesity, Improper body mechanics, Pain, Dizziness, Increased edema  Visit Diagnosis: Chronic low back pain, unspecified back pain laterality, unspecified whether sciatica present  Muscle weakness (generalized)  Other abnormalities of gait and mobility  Unsteadiness on feet     Problem List Patient Active Problem List   Diagnosis Date Noted  . Edema 04/16/2019  . Postoperative wound infection 03/13/2019  . Seroma of breast 03/11/2019  . Age-related osteoporosis without current pathological fracture  02/27/2019  . Malignant neoplasm of central portion of right breast in female, estrogen receptor positive ([place]) 02/12/2019  . Genetic testing 01/31/2019  . Malignant neoplasm of upper-inner quadrant of right breast in female, estrogen receptor positive ([place]) 01/15/2019  . Long term current use of opiate analgesic 12/02/2018  . Tympanic membrane central perforation, left 11/04/2018  . DDD (degenerative disc disease), lumbosacral 10/10/2018  . Abnormal MRI, lumbar spine (08/29/2018) 10/10/2018  . 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 Lumbar nerve root impingement (Bilateral: L4 & L5) (L>R: L5) 10/10/2018  . Lumbar Spinal instability of L4-5 10/10/2018  . Lumbar facet hypertrophy 10/10/2018  . COPD (chronic obstructive pulmonary disease) with emphysema ([place]) 08/28/2018  . Elevated rheumatoid factor 08/13/2018  . Elevated C-reactive protein (CRP) 08/12/2018  . Elevated sed rate 08/12/2018  . Lumbar facet syndrome (Bilateral) 08/12/2018  . Back pain with left-sided radiculopathy 08/12/2018  . DDD (degenerative disc disease), lumbar 08/12/2018  . Chronic lower extremity pain (Primary Area of Pain) (Bilateral) (L>R) 07/23/2018  . Chronic low back pain (Secondary Area of Pain) (Bilateral) (L>R) w/ sciatica (Bilateral) 07/23/2018  . Chronic pain syndrome 07/23/2018  . Pharmacologic therapy 07/23/2018  . Disorder of skeletal system 07/23/2018  . Problems influencing health status 07/23/2018  . Chronic sacroiliac joint pain [facility] Area of Pain) (Bilateral) (L>R) 07/23/2018  . Status post lumboperitoneal shunt placement 02/26/2018  . 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 Tympanic membrane perforation 05/30/2016  . PMB (postmenopausal bleeding) 04/26/2016  . Rash 12/27/2015  . 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 Plantar fasciitis of foot (Left) 05/26/2015  . Spondylosis  of lumbar region without myelopathy or radiculopathy 05/26/2015  . Lumbar foraminal stenosis (L3-4, L4-5, L5-S1) (L>R) 05/26/2015  . Spinal stenosis of lumbar region with neurogenic claudication 05/26/2015  . Encounter for screening mammogram for malignant neoplasm of breast 12/02/2014  . Goiter 12/02/2014  . Chronic idiopathic gout of foot (Right) 07/06/2014  . Biceps tendonitis (Right) 05/19/2014  . Shoulder pain (Right) 05/19/2014  . Facial weakness 05/05/2014  . Post herpetic neuralgia 05/05/2014  . Chronic dysfunction of left eustachian tube 03/24/2014  . Osteopenia 03/13/2014  . Lumbar Grade 1 Anterolisthesis of L3/4 (32m) & L4/5 (4-131m(w/ Dynamic instability) 03/13/2014  . Family history of colon cancer 12/02/2013  . Dyshidrotic eczema 11/17/2013  . Diastasis recti 04/08/2013  . Facial nerve paresis 07/31/2012  .  Blepharospasm 05/08/2012  . 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 Obesity 03/28/2012  . Hemoptysis 12/22/2011  . Neuropathic postherpetic trigeminal neuralgia 12/12/2011  . Allergic rhinitis, mild 10/12/2011  . Disequilibrium syndrome 09/27/2011  . Chronic bronchitis ([place]) 06/05/2011  . Essential hypertension 05/24/2011  . Type 2 diabetes mellitus without complications ([place]) 72/90/2111  . Pulmonary nodules 05/05/2011   Janna Arch, PT, DPT   08/04/2019, 6:07 PM  [place] MAIN [facility] SERVICES [address] [place], [place], [address] Phone: [phone]   Fax:  [phone]  Name: Susan Callahan MRN: [id number] Date of Birth: 1951/02/28

## 2019-08-05 NOTE — Therapy (Signed)
[place] MAIN [facility] SERVICES [address] [place], [place], [address] Phone: [phone]   Fax:  [phone]  Occupational Therapy Treatment  Patient Details  Name: Susan Callahan MRN: [id number] Date of Birth: [birth year] Referring Provider (OT): Humberto Leep, MD   Encounter Date: 08/04/2019  OT End of Session - 08/04/19 0953    Visit Number  48    Number of Visits  72    Date for OT Re-Evaluation  09/18/19    OT Start Time  0210    OT Stop Time  0318    OT Time Calculation (min)  68 min    Activity Tolerance  Patient tolerated treatment well;No increased pain    Behavior During Therapy  WFL for tasks assessed/performed       Past Medical History:  Diagnosis Date  . COPD (chronic obstructive pulmonary disease) ([place])   . Depression 1974  . Diabetes mellitus without complication ([place])   . Hypertension   . Migraine   . 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 LUMBAR PERITONEAL SHUNT    . TYMPANOSTOMY TUBE PLACEMENT      There were no vitals filed for this visit.  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 Pt reports pain of 3/10 in R axilla is unchanged since last visit. Pt describes pain as " pulling and soreness" in the volar arm and axilla.    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 01/30/19 partial mastectomy. Margins clear. 0/2 LN  negative for  disease. actinic keritosis,DDD, DM, obesity, thyroid nodules, pulmonary nodules, chronic back pain, HTN, COPD, hx depression    Limitations  pain and swelling in R axilla and arm, chronic back pain, muscle weakness, impaired balance, decreased RUE AROM, abnormal gait, difficulty walking, impaired functional mobility and transfers,    Repetition  Increases Symptoms     Special Tests  -Stemmer at MPs bilaterally    Patient Stated Goals  relduce pain and improve movement  in my R arm so I can do things I need to do. reduce risk of lymphedema    Pain Onset  More than a month ago    Pain Onset  More than a month ago                   OT Treatments/Exercises (OP) - 08/05/19 0001      ADLs   ADL Education Given  Yes      Manual Therapy   Manual Therapy  Edema management;Myofascial release;Manual Lymphatic Drainage (MLD);Compression Bandaging    Soft tissue mobilization  MFR to R axilla and medial volar UE - no cord release observ    Manual Lymphatic Drainage (MLD)  MLD to RUE/RUQ as established using cntalateral AAA    Compression Bandaging  Pt donns custom compression glove and sleeve with modified independence after session using nylon slippy and friction gloves.             OT Education - 08/04/19 0947    Education Details  Cont Pt edu for LE self care throughout session. Excellent return.    Person(s) Educated  Patient    Methods  Explanation;Handout;Demonstration    Comprehension  Verbalized understanding;Returned demonstration          OT  Long Term Goals - 06/19/19 1400      OT LONG TERM GOAL #5   Status  Unable to assess   garments have not been delivered for fitting yet     OT LONG TERM GOAL #6   Baseline  Max A    Time  12    Period  Weeks    Status  Partially Met    Target Date  08/31/19            Plan - 08/05/19 0954    Clinical Impression Statement  Pt tolerated MLD to RUE/ RUQ to address distal lymphedema and, myofacial release for AWS without difficulty today. Stubborn cording persists at axilla , especially at border of pectoralis and at antecubital fossa.  We were able to palpate cords in volar forarm again today, which suggests stretching thus far may be allowing cording to move more to surface of skin where we are able to work on these tissues more dirrectly with manual therapy. Hand swelling  persists, but is less dense than initially. Stubborn swelling persists  at MPs..Cont as per POC.    OT Occupational Profile and History  Comprehensive Assessment- Review of records and extensive additional review of physical, cognitive, psychosocial history related to current functional performance    Occupational performance deficits (Please refer to evaluation for details):  ADL's;IADL's;Social Participation;Leisure;Rest and Sleep;Work    Marketing executive / Function / Physical Skills  ADL;Decreased knowledge of precautions;ROM;UE functional use;Decreased knowledge of use of DME;Edema;Skin integrity;Pain;IADL    Rehab Potential  Good    Clinical Decision Making  Several treatment options, min-mod task modification necessary    Comorbidities Affecting Occupational Performance:  Presence of comorbidities impacting occupational performance    Comorbidities impacting occupational performance description:  see SUBJUECTIVE    Modification or Assistance to Complete Evaluation   Min-Moderate modification of tasks or assist with assess necessary to complete eval    OT Frequency  2x / week    OT Duration  12 weeks    OT Treatment/Interventions  Self-care/ADL training;Therapeutic exercise;Manual lymph drainage;Patient/family education;Therapeutic activities;DME and/or AE instruction;Manual Therapy;Other (comment)   consider fitting with prophylactic cmopression arm sleeve   Plan  BUE AROM ther ex to reduce axillary cording- 2 sets of 10 2-3 x daily    Consulted and Agree with Plan of Care  Patient       Patient will benefit from skilled therapeutic intervention in order to improve the following deficits and impairments:   Body Structure / Function / Physical Skills: ADL, Decreased knowledge of precautions, ROM, UE functional use, Decreased knowledge of use of DME, Edema, Skin integrity, Pain, IADL       Visit Diagnosis: Postmastectomy lymphedema syndrome    Problem List Patient Active Problem List    Diagnosis Date Noted  . Edema 04/16/2019  . Postoperative wound infection 03/13/2019  . Seroma of breast 03/11/2019  . Age-related osteoporosis without current pathological fracture 02/27/2019  . Malignant neoplasm of central portion of right breast in female, estrogen receptor positive ([place]) 02/12/2019  . Genetic testing 01/31/2019  . Malignant neoplasm of upper-inner quadrant of right breast in female, estrogen receptor positive ([place]) 01/15/2019  . Long term current use of opiate analgesic 12/02/2018  . Tympanic membrane central perforation, left 11/04/2018  . DDD (degenerative disc disease), lumbosacral 10/10/2018  . Abnormal MRI, lumbar spine (08/29/2018) 10/10/2018  . 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 Lumbar nerve root impingement (Bilateral: L4 & L5) (L>R:  L5) 10/10/2018  . Lumbar Spinal instability of L4-5 10/10/2018  . Lumbar facet hypertrophy 10/10/2018  . COPD (chronic obstructive pulmonary disease) with emphysema ([place]) 08/28/2018  . Elevated rheumatoid factor 08/13/2018  . Elevated C-reactive protein (CRP) 08/12/2018  . Elevated sed rate 08/12/2018  . Lumbar facet syndrome (Bilateral) 08/12/2018  . Back pain with left-sided radiculopathy 08/12/2018  . DDD (degenerative disc disease), lumbar 08/12/2018  . Chronic lower extremity pain (Primary Area of Pain) (Bilateral) (L>R) 07/23/2018  . Chronic low back pain (Secondary Area of Pain) (Bilateral) (L>R) w/ sciatica (Bilateral) 07/23/2018  . Chronic pain syndrome 07/23/2018  . Pharmacologic therapy 07/23/2018  . Disorder of skeletal system 07/23/2018  . Problems influencing health status 07/23/2018  . Chronic sacroiliac joint pain [facility] Area of Pain) (Bilateral) (L>R) 07/23/2018  . Status post lumboperitoneal shunt placement 02/26/2018  . 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 Tympanic membrane perforation 05/30/2016  . PMB  (postmenopausal bleeding) 04/26/2016  . Rash 12/27/2015  . 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 Plantar fasciitis of foot (Left) 05/26/2015  . Spondylosis of lumbar region without myelopathy or radiculopathy 05/26/2015  . Lumbar foraminal stenosis (L3-4, L4-5, L5-S1) (L>R) 05/26/2015  . Spinal stenosis of lumbar region with neurogenic claudication 05/26/2015  . Encounter for screening mammogram for malignant neoplasm of breast 12/02/2014  . Goiter 12/02/2014  . Chronic idiopathic gout of foot (Right) 07/06/2014  . Biceps tendonitis (Right) 05/19/2014  . Shoulder pain (Right) 05/19/2014  . Facial weakness 05/05/2014  . Post herpetic neuralgia 05/05/2014  . Chronic dysfunction of left eustachian tube 03/24/2014  . Osteopenia 03/13/2014  . Lumbar Grade 1 Anterolisthesis of L3/4 (34m) & L4/5 (4-146m(w/ Dynamic instability) 03/13/2014  . Family history of colon cancer 12/02/2013  . Dyshidrotic eczema 11/17/2013  . Diastasis recti 04/08/2013  . Facial nerve paresis 07/31/2012  . Blepharospasm 05/08/2012  . 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 Obesity 03/28/2012  . Hemoptysis 12/22/2011  . Neuropathic postherpetic trigeminal neuralgia 12/12/2011  . Allergic rhinitis, mild 10/12/2011  . Disequilibrium syndrome 09/27/2011  . Chronic bronchitis (HCYork09/24/2012  . Essential hypertension 05/24/2011  . Type 2 diabetes mellitus without complications (HCAu [place]0900/51/1021. Pulmonary nodules 05/05/2011   ThAndrey SpearmanMS, OTR/L, CLJefferson Cherry Hill Hospital1/24/20 9:57 AM   [place] REPaoli HospitalERVICES [address]hone: [phone] Fax:  [phone]: Susan LobergRN: [id number] of Birth: 6/28-Sep-1950

## 2019-08-06 ENCOUNTER — Other Ambulatory Visit: Payer: Self-pay

## 2019-08-06 ENCOUNTER — Ambulatory Visit: Payer: Medicare Other | Admitting: Occupational Therapy

## 2019-08-06 DIAGNOSIS — I890 Lymphedema, not elsewhere classified: Secondary | ICD-10-CM

## 2019-08-06 DIAGNOSIS — I972 Postmastectomy lymphedema syndrome: Secondary | ICD-10-CM | POA: Diagnosis not present

## 2019-08-06 NOTE — Therapy (Signed)
[place] MAIN [facility] SERVICES [address] [place], [place], [address] Phone: [phone]   Fax:  [phone]  Occupational Therapy Treatment  Patient Details  Name: Susan Callahan MRN: [id number] Date of Birth: September 11, 1951 Referring Provider (OT): Humberto Leep, MD   Encounter Date: 08/06/2019  OT End of Session - 08/06/19 1628    Visit Number  49    Number of Visits  72    Date for OT Re-Evaluation  09/18/19    OT Start Time  0300    OT Stop Time  0400    OT Time Calculation (min)  60 min    Activity Tolerance  Patient tolerated treatment well;No increased pain    Behavior During Therapy  WFL for tasks assessed/performed       Past Medical History:  Diagnosis Date  . COPD (chronic obstructive pulmonary disease) ([place])   . Depression 1974  . Diabetes mellitus without complication ([place])   . Hypertension   . Migraine   . 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 LUMBAR PERITONEAL SHUNT    . TYMPANOSTOMY TUBE PLACEMENT      There were no vitals filed for this visit.  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 Pt reports pain of 3/10 in R axilla is unchanged since last visit. Pt describes pain as " pulling and soreness" in the volar arm and axilla.    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 01/30/19 partial mastectomy. Margins clear. 0/2 LN  negative for  disease. actinic keritosis,DDD, DM, obesity, thyroid nodules, pulmonary nodules, chronic back pain, HTN, COPD, hx depression    Limitations  pain and swelling in R axilla and arm, chronic back pain, muscle weakness, impaired balance, decreased RUE AROM, abnormal gait, difficulty walking, impaired functional mobility and transfers,    Repetition  Increases Symptoms     Special Tests  -Stemmer at MPs bilaterally    Patient Stated Goals  relduce pain and improve movement  in my R arm so I can do things I need to do. reduce risk of lymphedema    Pain Onset  More than a month ago    Pain Onset  More than a month ago                   OT Treatments/Exercises (OP) - 08/06/19 0001      ADLs   ADL Education Given  Yes      Manual Therapy   Manual Therapy  Edema management;Manual Lymphatic Drainage (MLD);Compression Bandaging    Soft tissue mobilization  MFR to R axilla and medial volar UE - no cord release observ    Manual Lymphatic Drainage (MLD)  MLD to RUE/RUQ as established using cntalateral AAA    Compression Bandaging  Pt donns custom compression glove and sleeve with modified independence after session using nylon slippy and friction gloves.             OT Education - 08/06/19 1626    Education Details  Discussed lymphatic cording, related clinical and functional issues, and manual treatment techniques. Continued skilled Pt/caregiver education  And LE ADL training throughout visit for lymphedema self care/ home program, including compression wrapping, compression garment and device wear/care, lymphatic pumping ther ex, simple self-MLD,  and skin care. Discussed progress towards goals.    Person(s) Educated  Patient    Methods  Explanation;Handout;Demonstration    Comprehension  Verbalized understanding;Returned demonstration          OT Long Term Goals - 06/19/19 1400      OT LONG TERM GOAL #5   Status  Unable to assess   garments have not been delivered for fitting yet     OT LONG TERM GOAL #6   Baseline  Max A    Time  12    Period  Weeks    Status  Partially Met    Target Date  08/31/19            Plan - 08/06/19 1629    Clinical Impression Statement  Pt tolerated manual therapy including MLD , MFR for cord release and ending with MLD to decrease inflamation. Hand swelling persists over MPs. Arm swelling is  well controlled. We'll check comparative limb volumetrics next session to measure volume reduction and distribution. Pt continues to be diligently compliant with compression garments and home program. Cont as per POC.    OT Occupational Profile and History  Comprehensive Assessment- Review of records and extensive additional review of physical, cognitive, psychosocial history related to current functional performance    Occupational performance deficits (Please refer to evaluation for details):  ADL's;IADL's;Social Participation;Leisure;Rest and Sleep;Work    Marketing executive / Function / Physical Skills  ADL;Decreased knowledge of precautions;ROM;UE functional use;Decreased knowledge of use of DME;Edema;Skin integrity;Pain;IADL    Rehab Potential  Good    Clinical Decision Making  Several treatment options, min-mod task modification necessary    Comorbidities Affecting Occupational Performance:  Presence of comorbidities impacting occupational performance    Comorbidities impacting occupational performance description:  see SUBJUECTIVE    Modification or Assistance to Complete Evaluation   Min-Moderate modification of tasks or assist with assess necessary to complete eval    OT Frequency  2x / week    OT Duration  12 weeks    OT Treatment/Interventions  Self-care/ADL training;Therapeutic exercise;Manual lymph drainage;Patient/family education;Therapeutic activities;DME and/or AE instruction;Manual Therapy;Other (comment)   consider fitting with prophylactic cmopression arm sleeve   Plan  BUE AROM ther ex to reduce axillary cording- 2 sets of 10 2-3 x daily    Consulted and Agree with Plan of Care  Patient       Patient will benefit from skilled therapeutic intervention in order to improve the following deficits and impairments:   Body Structure / Function / Physical Skills: ADL, Decreased knowledge of precautions, ROM, UE functional use, Decreased knowledge of use of DME, Edema, Skin integrity,  Pain, IADL       Visit Diagnosis: Lymphedema, not elsewhere classified    Problem List Patient Active Problem List   Diagnosis Date Noted  . Edema 04/16/2019  . Postoperative wound infection 03/13/2019  . Seroma of breast 03/11/2019  . Age-related osteoporosis without current pathological fracture 02/27/2019  . Malignant neoplasm of central portion of right breast in female, estrogen receptor positive ([place]) 02/12/2019  . Genetic testing 01/31/2019  . Malignant neoplasm of upper-inner quadrant of right breast in female, estrogen receptor positive ([place]) 01/15/2019  . Long term current use of opiate analgesic 12/02/2018  . Tympanic membrane central perforation, left 11/04/2018  . DDD (degenerative disc disease), lumbosacral 10/10/2018  . Abnormal MRI, lumbar spine (08/29/2018) 10/10/2018  . 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 Lumbar nerve root impingement (Bilateral: L4 &  L5) (L>R: L5) 10/10/2018  . Lumbar Spinal instability of L4-5 10/10/2018  . Lumbar facet hypertrophy 10/10/2018  . COPD (chronic obstructive pulmonary disease) with emphysema ([place]) 08/28/2018  . Elevated rheumatoid factor 08/13/2018  . Elevated C-reactive protein (CRP) 08/12/2018  . Elevated sed rate 08/12/2018  . Lumbar facet syndrome (Bilateral) 08/12/2018  . Back pain with left-sided radiculopathy 08/12/2018  . DDD (degenerative disc disease), lumbar 08/12/2018  . Chronic lower extremity pain (Primary Area of Pain) (Bilateral) (L>R) 07/23/2018  . Chronic low back pain (Secondary Area of Pain) (Bilateral) (L>R) w/ sciatica (Bilateral) 07/23/2018  . Chronic pain syndrome 07/23/2018  . Pharmacologic therapy 07/23/2018  . Disorder of skeletal system 07/23/2018  . Problems influencing health status 07/23/2018  . Chronic sacroiliac joint pain [facility] Area of Pain) (Bilateral) (L>R) 07/23/2018  . Status post lumboperitoneal shunt placement 02/26/2018  .  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 Tympanic membrane perforation 05/30/2016  . PMB (postmenopausal bleeding) 04/26/2016  . Rash 12/27/2015  . 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 Plantar fasciitis of foot (Left) 05/26/2015  . Spondylosis of lumbar region without myelopathy or radiculopathy 05/26/2015  . Lumbar foraminal stenosis (L3-4, L4-5, L5-S1) (L>R) 05/26/2015  . Spinal stenosis of lumbar region with neurogenic claudication 05/26/2015  . Encounter for screening mammogram for malignant neoplasm of breast 12/02/2014  . Goiter 12/02/2014  . Chronic idiopathic gout of foot (Right) 07/06/2014  . Biceps tendonitis (Right) 05/19/2014  . Shoulder pain (Right) 05/19/2014  . Facial weakness 05/05/2014  . Post herpetic neuralgia 05/05/2014  . Chronic dysfunction of left eustachian tube 03/24/2014  . Osteopenia 03/13/2014  . Lumbar Grade 1 Anterolisthesis of L3/4 (62m) & L4/5 (4-184m(w/ Dynamic instability) 03/13/2014  . Family history of colon cancer 12/02/2013  . Dyshidrotic eczema 11/17/2013  . Diastasis recti 04/08/2013  . Facial nerve paresis 07/31/2012  . Blepharospasm 05/08/2012  . 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 Obesity 03/28/2012  . Hemoptysis 12/22/2011  . Neuropathic postherpetic trigeminal neuralgia 12/12/2011  . Allergic rhinitis, mild 10/12/2011  . Disequilibrium syndrome 09/27/2011  . Chronic bronchitis (HCSt. Gabriel09/24/2012  . Essential hypertension 05/24/2011  . Type 2 diabetes mellitus without complications (HCAu [place]0916/06/9603. Pulmonary nodules 05/05/2011    ThAndrey SpearmanMS, OTR/L, CLBountiful Surgery Center LLC1/25/20 4:32 PM  [place] RELittle River Memorial HospitalERVICES [address]hone: [phone] Fax:  [phone]: Susan MangesRN: [id number] of Birth: 6/Aug 12, 1951

## 2019-08-11 ENCOUNTER — Ambulatory Visit: Payer: Medicare Other | Admitting: Occupational Therapy

## 2019-08-11 ENCOUNTER — Ambulatory Visit: Payer: Medicare Other

## 2019-08-11 ENCOUNTER — Other Ambulatory Visit: Payer: Self-pay

## 2019-08-11 DIAGNOSIS — I890 Lymphedema, not elsewhere classified: Secondary | ICD-10-CM

## 2019-08-11 DIAGNOSIS — H9202 Otalgia, left ear: Secondary | ICD-10-CM | POA: Insufficient documentation

## 2019-08-11 DIAGNOSIS — I972 Postmastectomy lymphedema syndrome: Secondary | ICD-10-CM | POA: Diagnosis not present

## 2019-08-11 NOTE — Therapy (Signed)
[place] MAIN [facility] SERVICES [address] [place], [place], [address] Phone: [phone]   Fax:  [phone]  Occupational Therapy Treatment  Patient Details  Name: Susan Callahan MRN: [id number] Date of Birth: 24-Mar-1951 Referring Provider (OT): Humberto Leep, MD   Encounter Date: 08/11/2019  OT End of Session - 08/11/19 1542    Visit Number  50    Number of Visits  72    Date for OT Re-Evaluation  09/18/19    OT Start Time  0210    OT Stop Time  0310    OT Time Calculation (min)  60 min    Activity Tolerance  Patient tolerated treatment well;No increased pain    Behavior During Therapy  WFL for tasks assessed/performed       Past Medical History:  Diagnosis Date  . COPD (chronic obstructive pulmonary disease) ([place])   . Depression 1974  . Diabetes mellitus without complication ([place])   . Hypertension   . Migraine   . 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 LUMBAR PERITONEAL SHUNT    . TYMPANOSTOMY TUBE PLACEMENT      There were no vitals filed for this visit.  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 Pt does not rate pain numerically today , but states pain ni R axilla and medial arm associated with "cordoiing" is unchanged since last visit. Atlast session she rate RUE pain as 3/10.    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 01/30/19 partial mastectomy. Margins clear. 0/2 LN  negative for  disease. actinic keritosis,DDD, DM, obesity, thyroid nodules, pulmonary nodules, chronic back pain, HTN, COPD, hx depression    Limitations  pain and swelling in R axilla and arm, chronic back pain, muscle weakness, impaired balance, decreased RUE AROM, abnormal gait, difficulty walking, impaired functional mobility and  transfers,    Repetition  Increases Symptoms    Special Tests  -Stemmer at MPs bilaterally    Patient Stated Goals  relduce pain and improve movement  in my R arm so I can do things I need to do. reduce risk of lymphedema    Pain Onset  More than a month ago    Pain Onset  More than a month ago                   OT Treatments/Exercises (OP) - 08/11/19 0001      ADLs   ADL Education Given  Yes      Manual Therapy   Manual Therapy  Edema management;Taping    Edema Management  kinesiotaping to L CMC joint    Soft tissue mobilization  MFR to R axilla and medial volar UE - no cord release observ    Myofascial Release  for cord release R axilla    Manual Lymphatic Drainage (MLD)  MLD to RUE/RUQ as established using cntalateral AAA    Compression Bandaging  Pt donns custom compression glove and sleeve with modified independence after session using nylon slippy and friction gloves.             OT Education - 08/11/19 1536    Education Details  Pt edu for [facility] arthritis and Comfort Cool neoprene splint . Providedhandouts. Reviewed Kinesiotape method of [place] support and  demonstrated technique. Provided tape for practice.    Person(s) Educated  Patient    Methods  Explanation;Handout;Demonstration    Comprehension  Verbalized understanding;Returned demonstration          OT Long Term Goals - 08/11/19 1500      OT LONG TERM GOAL #1   Title  Pt independent with modified lymphedema precautions and prevention strategies to limit LE progression.    Baseline  Max A    Time  4    Period  Days    Status  Achieved      OT LONG TERM GOAL #2   Title  Pt will be independent with lymphedema self-care home program and AWS ther ex to improve functional performance of basic and instrumental ADLs by DC.    Baseline  Max A    Time  12    Period  Weeks    Status  Achieved      OT LONG TERM GOAL #3   Title  Pt wil report 0/10 pain with RUE functional use ( reaching, lifting,  carrying, dressing, dressing, etc) to achieve independent , pain free ADLs performance.    Baseline  Max A 05/05/19: Reporting 4/10 lymphadema pain now combined with AWS related pain. 05/22/19 AWS pain resolved. Pt now experiencing pain in hand and distal FA associated with recent onset of lymphedema.    Time  12    Period  Weeks    Status  Achieved      OT LONG TERM GOAL #4   Title  Pt will be able to apply gradient compression wraps using correct techniques independently to control UE swelling and limit LE progression.    Baseline  Max A    Time  1    Period  Weeks    Status  Achieved      OT LONG TERM GOAL #5   Title  After skilled trainng Pt will be able to don and doff  appropriate compression garments and/ or devices using correct techniques and assistive devices with extra time  by end of Intensive Phase CDT for optimal LE management to limit progression over time.    Baseline  Max A    Time  12    Period  Weeks    Status  Achieved      OT LONG TERM GOAL #6   Title  Due to onset of RUE swelling concentrated primarily in R hand during manual therapy for cording  Pt will achieve 5%, or greater RUE  limb volume reductions during Intensive Phase CDT to  improve ADLs performances, to reduce infection risk, and to limit LE progression.    Baseline  Max A    Time  12    Period  Weeks    Status  Partially Met   hand swelling continues to fluctuate           Plan - 08/11/19 1545    Clinical Impression Statement  Provided MLD and MFR without increased pain / discomfort. Cords           in R axilla, antecubital fossa and medial fa persist and are easily palpable and visible. They continue to cause pain  with all activities reqwuiring reaching  including dressing, bathing, grooming, home management, and sometimes driving. Pt demonstrates progress towards all goals. She is diligent with daytime compression garments and ther ex. She is exploring funding for an advanced sequential pneumatic  compression device , or "pump" (recommend Environmental health practitioner) and  has upcoming consult with plastic surgeon to explore surgical remedy for cords. Pt will benefit from ongoing OT to reduce and manage hand and limb swelling. After meeting with surgeon on 12/ 18 we will define new goals and POC.    OT Occupational Profile and History  Comprehensive Assessment- Review of records and extensive additional review of physical, cognitive, psychosocial history related to current functional performance    Occupational performance deficits (Please refer to evaluation for details):  ADL's;IADL's;Social Participation;Leisure;Rest and Sleep;Work    Marketing executive / Function / Physical Skills  ADL;Decreased knowledge of precautions;ROM;UE functional use;Decreased knowledge of use of DME;Edema;Skin integrity;Pain;IADL    Rehab Potential  Good    Clinical Decision Making  Several treatment options, min-mod task modification necessary    Comorbidities Affecting Occupational Performance:  Presence of comorbidities impacting occupational performance    Comorbidities impacting occupational performance description:  see SUBJUECTIVE    Modification or Assistance to Complete Evaluation   Min-Moderate modification of tasks or assist with assess necessary to complete eval    OT Frequency  2x / week    OT Duration  12 weeks    OT Treatment/Interventions  Self-care/ADL training;Therapeutic exercise;Manual lymph drainage;Patient/family education;Therapeutic activities;DME and/or AE instruction;Manual Therapy;Other (comment)   consider fitting with prophylactic cmopression arm sleeve   Plan  BUE AROM ther ex to reduce axillary cording- 2 sets of 10 2-3 x daily    Consulted and Agree with Plan of Care  Patient       Patient will benefit from skilled therapeutic intervention in order to improve the following deficits and impairments:   Body Structure / Function / Physical Skills: ADL, Decreased knowledge of precautions,  ROM, UE functional use, Decreased knowledge of use of DME, Edema, Skin integrity, Pain, IADL       Visit Diagnosis: Lymphedema, not elsewhere classified    Problem List Patient Active Problem List   Diagnosis Date Noted  . Edema 04/16/2019  . Postoperative wound infection 03/13/2019  . Seroma of breast 03/11/2019  . Age-related osteoporosis without current pathological fracture 02/27/2019  . Malignant neoplasm of central portion of right breast in female, estrogen receptor positive ([place]) 02/12/2019  . Genetic testing 01/31/2019  . Malignant neoplasm of upper-inner quadrant of right breast in female, estrogen receptor positive ([place]) 01/15/2019  . Long term current use of opiate analgesic 12/02/2018  . Tympanic membrane central perforation, left 11/04/2018  . DDD (degenerative disc disease), lumbosacral 10/10/2018  . Abnormal MRI, lumbar spine (08/29/2018) 10/10/2018  . 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 Lumbar nerve root impingement (Bilateral: L4 & L5) (L>R: L5) 10/10/2018  . Lumbar Spinal instability of L4-5 10/10/2018  . Lumbar facet hypertrophy 10/10/2018  . COPD (chronic obstructive pulmonary disease) with emphysema ([place]) 08/28/2018  . Elevated rheumatoid factor 08/13/2018  . Elevated C-reactive protein (CRP) 08/12/2018  . Elevated sed rate 08/12/2018  . Lumbar facet syndrome (Bilateral) 08/12/2018  . Back pain with left-sided radiculopathy 08/12/2018  . DDD (degenerative disc disease), lumbar 08/12/2018  . Chronic lower extremity pain (Primary Area of Pain) (Bilateral) (L>R) 07/23/2018  . Chronic low back pain (Secondary Area of Pain) (Bilateral) (L>R) w/ sciatica (Bilateral) 07/23/2018  . Chronic pain syndrome 07/23/2018  . Pharmacologic therapy 07/23/2018  . Disorder of skeletal system 07/23/2018  . Problems influencing health status 07/23/2018  . Chronic sacroiliac joint pain [facility] Area of Pain) (Bilateral) (L>R)  07/23/2018  . Status post lumboperitoneal shunt placement 02/26/2018  . Osteoarthritis of  first carpometacarpal joint of hand (Left) 05/04/2017  . Tympanic membrane perforation 05/30/2016  . PMB (postmenopausal bleeding) 04/26/2016  . Rash 12/27/2015  . 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 Plantar fasciitis of foot (Left) 05/26/2015  . Spondylosis of lumbar region without myelopathy or radiculopathy 05/26/2015  . Lumbar foraminal stenosis (L3-4, L4-5, L5-S1) (L>R) 05/26/2015  . Spinal stenosis of lumbar region with neurogenic claudication 05/26/2015  . Encounter for screening mammogram for malignant neoplasm of breast 12/02/2014  . Goiter 12/02/2014  . Chronic idiopathic gout of foot (Right) 07/06/2014  . Biceps tendonitis (Right) 05/19/2014  . Shoulder pain (Right) 05/19/2014  . Facial weakness 05/05/2014  . Post herpetic neuralgia 05/05/2014  . Chronic dysfunction of left eustachian tube 03/24/2014  . Osteopenia 03/13/2014  . Lumbar Grade 1 Anterolisthesis of L3/4 (71m) & L4/5 (4-193m(w/ Dynamic instability) 03/13/2014  . Family history of colon cancer 12/02/2013  . Dyshidrotic eczema 11/17/2013  . Diastasis recti 04/08/2013  . Facial nerve paresis 07/31/2012  . Blepharospasm 05/08/2012  . 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 Obesity 03/28/2012  . Hemoptysis 12/22/2011  . Neuropathic postherpetic trigeminal neuralgia 12/12/2011  . Allergic rhinitis, mild 10/12/2011  . Disequilibrium syndrome 09/27/2011  . Chronic bronchitis (HCT[place]09/24/2012  . Essential hypertension 05/24/2011  . Type 2 diabetes mellitus without complications (HC[place]0920/25/4270. Pulmonary nodules 05/05/2011    ThAndrey SpearmanMS, OTR/L, CLSt. Mary'S Medical Center, [place]1/30/20 3:52 PM  CoLyndhurstAIN RENorwegian-American HospitalERVICES [address] DrivedRedondo BeachNCAlaska[address]hone: [phone] Fax:   [phone]: LiJora GalluzzoRN: [id number] of Birth: 6/October 05, 1950

## 2019-08-11 NOTE — Therapy (Signed)
[place] MAIN [facility] SERVICES [address] [place], [place], [address] Phone: [phone]   Fax:  [phone]  Occupational Therapy Treatment Note and Progress Report: Lymphedema Care  Patient Details  Name: Susan Callahan MRN: [id number] Date of Birth: 12-Apr-1951 Referring Provider (OT): Humberto Leep, MD   Encounter Date: 08/11/2019  OT End of Session - 08/11/19 1542    Visit Number  50    Number of Visits  72    Date for OT Re-Evaluation  09/18/19    OT Start Time  0210    OT Stop Time  0310    OT Time Calculation (min)  60 min    Activity Tolerance  Patient tolerated treatment well;No increased pain    Behavior During Therapy  WFL for tasks assessed/performed       Past Medical History:  Diagnosis Date  . COPD (chronic obstructive pulmonary disease) ([place])   . Depression 1974  . Diabetes mellitus without complication ([place])   . Hypertension   . Migraine   . 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 LUMBAR PERITONEAL SHUNT    . TYMPANOSTOMY TUBE PLACEMENT      There were no vitals filed for this visit.  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 Pt does not rate pain numerically today , but states pain ni R axilla and medial arm associated with "cordoiing" is unchanged since last visit. Atlast session she rate RUE pain as 3/10.    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 01/30/19 partial mastectomy. Margins clear. 0/2 LN  negative for  disease. actinic keritosis,DDD, DM, obesity, thyroid nodules, pulmonary nodules, chronic back pain, HTN, COPD, hx depression    Limitations  pain and swelling in R axilla and arm, chronic back pain, muscle weakness, impaired balance, decreased RUE AROM, abnormal gait, difficulty  walking, impaired functional mobility and transfers,    Repetition  Increases Symptoms    Special Tests  -Stemmer at MPs bilaterally    Patient Stated Goals  relduce pain and improve movement  in my R arm so I can do things I need to do. reduce risk of lymphedema    Pain Onset  More than a month ago    Pain Onset  More than a month ago                   OT Treatments/Exercises (OP) - 08/11/19 0001      ADLs   ADL Education Given  Yes      Manual Therapy   Manual Therapy  Edema management;Taping    Edema Management  kinesiotaping to L CMC joint    Soft tissue mobilization  MFR to R axilla and medial volar UE - no cord release observ    Myofascial Release  for cord release R axilla    Manual Lymphatic Drainage (MLD)  MLD to RUE/RUQ as established using cntalateral AAA    Compression Bandaging  Pt donns custom compression glove and sleeve with modified independence after session using nylon slippy and friction gloves.             OT Education - 08/11/19 1536    Education Details  Pt edu for [facility] arthritis and Comfort Cool neoprene splint . Providedhandouts. Reviewed  Kinesiotape method of CMC support and demonstrated technique. Provided tape for practice.    Person(s) Educated  Patient    Methods  Explanation;Handout;Demonstration    Comprehension  Verbalized understanding;Returned demonstration          OT Long Term Goals - 08/11/19 1500      OT LONG TERM GOAL #1   Title  Pt independent with modified lymphedema precautions and prevention strategies to limit LE progression.    Baseline  Max A    Time  4    Period  Days    Status  Achieved      OT LONG TERM GOAL #2   Title  Pt will be independent with lymphedema self-care home program and AWS ther ex to improve functional performance of basic and instrumental ADLs by DC.    Baseline  Max A    Time  12    Period  Weeks    Status  Achieved      OT LONG TERM GOAL #3   Title  Pt wil report 0/10 pain with  RUE functional use ( reaching, lifting, carrying, dressing, dressing, etc) to achieve independent , pain free ADLs performance.    Baseline  Max A 05/05/19: Reporting 4/10 lymphadema pain now combined with AWS related pain. 05/22/19 AWS pain resolved. Pt now experiencing pain in hand and distal FA associated with recent onset of lymphedema.    Time  12    Period  Weeks    Status  Achieved      OT LONG TERM GOAL #4   Title  Pt will be able to apply gradient compression wraps using correct techniques independently to control UE swelling and limit LE progression.    Baseline  Max A    Time  1    Period  Weeks    Status  Achieved      OT LONG TERM GOAL #5   Title  After skilled trainng Pt will be able to don and doff  appropriate compression garments and/ or devices using correct techniques and assistive devices with extra time  by end of Intensive Phase CDT for optimal LE management to limit progression over time.    Baseline  Max A    Time  12    Period  Weeks    Status  Achieved      OT LONG TERM GOAL #6   Title  Due to onset of RUE swelling concentrated primarily in R hand during manual therapy for cording  Pt will achieve 5%, or greater RUE  limb volume reductions during Intensive Phase CDT to  improve ADLs performances, to reduce infection risk, and to limit LE progression.    Baseline  Max A    Time  12    Period  Weeks    Status  Partially Met   hand swelling continues to fluctuate           Plan - 08/11/19 1545    Clinical Impression Statement  Provided MLD and MFR without increased pain / discomfort. Cords           in R axilla, antecubital fossa and medial fa persist and are easily palpable and visible. They continue to cause pain  with all activities reqwuiring reaching  including dressing, bathing, grooming, home management, and sometimes driving. Pt demonstrates progress towards all goals. She is diligent with daytime compression garments and ther ex. She is exploring  funding for an advanced sequential pneumatic compression device , or "  pump" (recommend [employer]) and has upcoming consult with plastic surgeon to explore surgical remedy for cords. Pt will benefit from ongoing OT to reduce and manage hand and limb swelling. After meeting with surgeon on 12/ 18 we will define new goals and POC.    OT Occupational Profile and History  Comprehensive Assessment- Review of records and extensive additional review of physical, cognitive, psychosocial history related to current functional performance    Occupational performance deficits (Please refer to evaluation for details):  ADL's;IADL's;Social Participation;Leisure;Rest and Sleep;Work    Marketing executive / Function / Physical Skills  ADL;Decreased knowledge of precautions;ROM;UE functional use;Decreased knowledge of use of DME;Edema;Skin integrity;Pain;IADL    Rehab Potential  Good    Clinical Decision Making  Several treatment options, min-mod task modification necessary    Comorbidities Affecting Occupational Performance:  Presence of comorbidities impacting occupational performance    Comorbidities impacting occupational performance description:  see SUBJUECTIVE    Modification or Assistance to Complete Evaluation   Min-Moderate modification of tasks or assist with assess necessary to complete eval    OT Frequency  2x / week    OT Duration  12 weeks    OT Treatment/Interventions  Self-care/ADL training;Therapeutic exercise;Manual lymph drainage;Patient/family education;Therapeutic activities;DME and/or AE instruction;Manual Therapy;Other (comment)   consider fitting with prophylactic cmopression arm sleeve   Plan  BUE AROM ther ex to reduce axillary cording- 2 sets of 10 2-3 x daily    Consulted and Agree with Plan of Care  Patient       Patient will benefit from skilled therapeutic intervention in order to improve the following deficits and impairments:   Body Structure / Function / Physical  Skills: ADL, Decreased knowledge of precautions, ROM, UE functional use, Decreased knowledge of use of DME, Edema, Skin integrity, Pain, IADL       Visit Diagnosis: Lymphedema, not elsewhere classified    Problem List Patient Active Problem List   Diagnosis Date Noted  . Edema 04/16/2019  . Postoperative wound infection 03/13/2019  . Seroma of breast 03/11/2019  . Age-related osteoporosis without current pathological fracture 02/27/2019  . Malignant neoplasm of central portion of right breast in female, estrogen receptor positive ([place]) 02/12/2019  . Genetic testing 01/31/2019  . Malignant neoplasm of upper-inner quadrant of right breast in female, estrogen receptor positive ([place]) 01/15/2019  . Long term current use of opiate analgesic 12/02/2018  . Tympanic membrane central perforation, left 11/04/2018  . DDD (degenerative disc disease), lumbosacral 10/10/2018  . Abnormal MRI, lumbar spine (08/29/2018) 10/10/2018  . 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 Lumbar nerve root impingement (Bilateral: L4 & L5) (L>R: L5) 10/10/2018  . Lumbar Spinal instability of L4-5 10/10/2018  . Lumbar facet hypertrophy 10/10/2018  . COPD (chronic obstructive pulmonary disease) with emphysema ([place]) 08/28/2018  . Elevated rheumatoid factor 08/13/2018  . Elevated C-reactive protein (CRP) 08/12/2018  . Elevated sed rate 08/12/2018  . Lumbar facet syndrome (Bilateral) 08/12/2018  . Back pain with left-sided radiculopathy 08/12/2018  . DDD (degenerative disc disease), lumbar 08/12/2018  . Chronic lower extremity pain (Primary Area of Pain) (Bilateral) (L>R) 07/23/2018  . Chronic low back pain (Secondary Area of Pain) (Bilateral) (L>R) w/ sciatica (Bilateral) 07/23/2018  . Chronic pain syndrome 07/23/2018  . Pharmacologic therapy 07/23/2018  . Disorder of skeletal system 07/23/2018  . Problems influencing health status 07/23/2018  . Chronic sacroiliac joint  pain [facility] Area of Pain) (Bilateral) (L>R) 07/23/2018  . Status post lumboperitoneal shunt  placement 02/26/2018  . 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 Tympanic membrane perforation 05/30/2016  . PMB (postmenopausal bleeding) 04/26/2016  . Rash 12/27/2015  . 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 Plantar fasciitis of foot (Left) 05/26/2015  . Spondylosis of lumbar region without myelopathy or radiculopathy 05/26/2015  . Lumbar foraminal stenosis (L3-4, L4-5, L5-S1) (L>R) 05/26/2015  . Spinal stenosis of lumbar region with neurogenic claudication 05/26/2015  . Encounter for screening mammogram for malignant neoplasm of breast 12/02/2014  . Goiter 12/02/2014  . Chronic idiopathic gout of foot (Right) 07/06/2014  . Biceps tendonitis (Right) 05/19/2014  . Shoulder pain (Right) 05/19/2014  . Facial weakness 05/05/2014  . Post herpetic neuralgia 05/05/2014  . Chronic dysfunction of left eustachian tube 03/24/2014  . Osteopenia 03/13/2014  . Lumbar Grade 1 Anterolisthesis of L3/4 (43m) & L4/5 (4-1105m(w/ Dynamic instability) 03/13/2014  . Family history of colon cancer 12/02/2013  . Dyshidrotic eczema 11/17/2013  . Diastasis recti 04/08/2013  . Facial nerve paresis 07/31/2012  . Blepharospasm 05/08/2012  . 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 Obesity 03/28/2012  . Hemoptysis 12/22/2011  . Neuropathic postherpetic trigeminal neuralgia 12/12/2011  . Allergic rhinitis, mild 10/12/2011  . Disequilibrium syndrome 09/27/2011  . Chronic bronchitis (HCDeatsville09/24/2012  . Essential hypertension 05/24/2011  . Type 2 diabetes mellitus without complications ([id number]. Pulmonary nodules 05/05/2011    ThAndrey SpearmanMS, OTR/L, CLWar Memorial Hospital1/30/20 3:53 PM  CoDoolittleAIN REPeak One Surgery CenterERVICES [address]dLonaconingNCAlaska [id number]: [phone] Fax:  [phone]: LiAlveta QuintelaRN: [id number] of Birth: 02/10/13/1952

## 2019-08-13 ENCOUNTER — Ambulatory Visit: Payer: Medicare Other

## 2019-08-13 ENCOUNTER — Ambulatory Visit: Payer: Medicare Other | Attending: Rehabilitation | Admitting: Occupational Therapy

## 2019-08-13 ENCOUNTER — Other Ambulatory Visit: Payer: Self-pay

## 2019-08-13 DIAGNOSIS — M545 Low back pain, unspecified: Secondary | ICD-10-CM

## 2019-08-13 DIAGNOSIS — R2689 Other abnormalities of gait and mobility: Secondary | ICD-10-CM | POA: Diagnosis present

## 2019-08-13 DIAGNOSIS — R2681 Unsteadiness on feet: Secondary | ICD-10-CM | POA: Diagnosis present

## 2019-08-13 DIAGNOSIS — M6281 Muscle weakness (generalized): Secondary | ICD-10-CM

## 2019-08-13 DIAGNOSIS — G8929 Other chronic pain: Secondary | ICD-10-CM | POA: Diagnosis present

## 2019-08-13 DIAGNOSIS — I972 Postmastectomy lymphedema syndrome: Secondary | ICD-10-CM | POA: Insufficient documentation

## 2019-08-13 NOTE — Therapy (Signed)
[place] MAIN [facility] SERVICES [address] [place], [place], [address] Phone: [phone]   Fax:  [phone]  Occupational Therapy Treatment  Patient Details  Name: Susan Callahan MRN: [id number] Date of Birth: 10/21/1950 Referring Provider (OT): Humberto Leep, MD   Encounter Date: 08/13/2019  OT End of Session - 08/13/19 1636    Visit Number  51    Number of Visits  72    Date for OT Re-Evaluation  09/18/19    OT Start Time  0210    OT Stop Time  0310    OT Time Calculation (min)  60 min    Activity Tolerance  Patient tolerated treatment well;No increased pain    Behavior During Therapy  WFL for tasks assessed/performed       Past Medical History:  Diagnosis Date  . COPD (chronic obstructive pulmonary disease) ([place])   . Depression 1974  . Diabetes mellitus without complication ([place])   . Hypertension   . Migraine   . 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 LUMBAR PERITONEAL SHUNT    . TYMPANOSTOMY TUBE PLACEMENT      There were no vitals filed for this visit.  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 Pt does not rate pain numerically today , but states pain ni R axilla and medial arm associated with "cordoiing" is unchanged since last visit. Atlast session she rate RUE pain as 3/10.    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 01/30/19 partial mastectomy. Margins clear. 0/2 LN  negative for  disease. actinic keritosis,DDD, DM, obesity, thyroid nodules, pulmonary nodules, chronic back pain, HTN, COPD, hx depression    Limitations  pain and swelling in R axilla and arm, chronic back pain, muscle weakness, impaired balance, decreased RUE AROM, abnormal gait, difficulty walking, impaired functional mobility and  transfers,    Repetition  Increases Symptoms    Special Tests  -Stemmer at MPs bilaterally    Patient Stated Goals  relduce pain and improve movement  in my R arm so I can do things I need to do. reduce risk of lymphedema    Pain Onset  More than a month ago    Pain Onset  More than a month ago                   OT Treatments/Exercises (OP) - 08/13/19 0001      ADLs   ADL Education Given  Yes      Manual Therapy   Manual Therapy  Edema management    Soft tissue mobilization  MFR to R axilla and medial volar UE - no cord release observ    Myofascial Release  for cord release R axilla    Manual Lymphatic Drainage (MLD)  MLD to RUE/RUQ as established using cntalateral AAA    Compression Bandaging  Pt donns custom compression glove and sleeve with modified independence after session using nylon slippy and friction gloves.             OT Education - 08/13/19 1636    Education Details  Cont Pt edu for LE self care throughout session. Excellent return.    Person(s) Educated  Patient    Methods  Explanation;Handout;Demonstration    Comprehension  Verbalized  understanding;Returned demonstration          OT Long Term Goals - 08/11/19 1500      OT LONG TERM GOAL #1   Title  Pt independent with modified lymphedema precautions and prevention strategies to limit LE progression.    Baseline  Max A    Time  4    Period  Days    Status  Achieved      OT LONG TERM GOAL #2   Title  Pt will be independent with lymphedema self-care home program and AWS ther ex to improve functional performance of basic and instrumental ADLs by DC.    Baseline  Max A    Time  12    Period  Weeks    Status  Achieved      OT LONG TERM GOAL #3   Title  Pt wil report 0/10 pain with RUE functional use ( reaching, lifting, carrying, dressing, dressing, etc) to achieve independent , pain free ADLs performance.    Baseline  Max A 05/05/19: Reporting 4/10 lymphadema pain now combined with AWS  related pain. 05/22/19 AWS pain resolved. Pt now experiencing pain in hand and distal FA associated with recent onset of lymphedema.    Time  12    Period  Weeks    Status  Achieved      OT LONG TERM GOAL #4   Title  Pt will be able to apply gradient compression wraps using correct techniques independently to control UE swelling and limit LE progression.    Baseline  Max A    Time  1    Period  Weeks    Status  Achieved      OT LONG TERM GOAL #5   Title  After skilled trainng Pt will be able to don and doff  appropriate compression garments and/ or devices using correct techniques and assistive devices with extra time  by end of Intensive Phase CDT for optimal LE management to limit progression over time.    Baseline  Max A    Time  12    Period  Weeks    Status  Achieved      OT LONG TERM GOAL #6   Title  Due to onset of RUE swelling concentrated primarily in R hand during manual therapy for cording  Pt will achieve 5%, or greater RUE  limb volume reductions during Intensive Phase CDT to  improve ADLs performances, to reduce infection risk, and to limit LE progression.    Baseline  Max A    Time  12    Period  Weeks    Status  Partially Met   hand swelling continues to fluctuate           Plan - 08/13/19 1637    Clinical Impression Statement  Observe increased swelling beneath both eyesm L>R with bruising under L eye as well as if "black". Pt states, "Sometthing is wrong with the whole L side of my face." Emphasis of session on MFR to reduce axilllary cording pain and inflammation. Started and finished session with MLD as established. No change observed in hand swelling. Cont as per OC until Pt has upcoming consult w/ plastic surgeon.    OT Occupational Profile and History  Comprehensive Assessment- Review of records and extensive additional review of physical, cognitive, psychosocial history related to current functional performance    Occupational performance deficits (Please  refer to evaluation for details):  ADL's;IADL's;Social Participation;Leisure;Rest and Sleep;Work  Body Structure / Function / Physical Skills  ADL;Decreased knowledge of precautions;ROM;UE functional use;Decreased knowledge of use of DME;Edema;Skin integrity;Pain;IADL    Rehab Potential  Good    Clinical Decision Making  Several treatment options, min-mod task modification necessary    Comorbidities Affecting Occupational Performance:  Presence of comorbidities impacting occupational performance    Comorbidities impacting occupational performance description:  see SUBJUECTIVE    Modification or Assistance to Complete Evaluation   Min-Moderate modification of tasks or assist with assess necessary to complete eval    OT Frequency  2x / week    OT Duration  12 weeks    OT Treatment/Interventions  Self-care/ADL training;Therapeutic exercise;Manual lymph drainage;Patient/family education;Therapeutic activities;DME and/or AE instruction;Manual Therapy;Other (comment)   consider fitting with prophylactic cmopression arm sleeve   Plan  BUE AROM ther ex to reduce axillary cording- 2 sets of 10 2-3 x daily    Consulted and Agree with Plan of Care  Patient       Patient will benefit from skilled therapeutic intervention in order to improve the following deficits and impairments:   Body Structure / Function / Physical Skills: ADL, Decreased knowledge of precautions, ROM, UE functional use, Decreased knowledge of use of DME, Edema, Skin integrity, Pain, IADL       Visit Diagnosis: Post-mastectomy lymphedema syndrome    Problem List Patient Active Problem List   Diagnosis Date Noted  . Edema 04/16/2019  . Postoperative wound infection 03/13/2019  . Seroma of breast 03/11/2019  . Age-related osteoporosis without current pathological fracture 02/27/2019  . Malignant neoplasm of central portion of right breast in female, estrogen receptor positive ([place]) 02/12/2019  . Genetic testing 01/31/2019   . Malignant neoplasm of upper-inner quadrant of right breast in female, estrogen receptor positive ([place]) 01/15/2019  . Long term current use of opiate analgesic 12/02/2018  . Tympanic membrane central perforation, left 11/04/2018  . DDD (degenerative disc disease), lumbosacral 10/10/2018  . Abnormal MRI, lumbar spine (08/29/2018) 10/10/2018  . 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 Lumbar nerve root impingement (Bilateral: L4 & L5) (L>R: L5) 10/10/2018  . Lumbar Spinal instability of L4-5 10/10/2018  . Lumbar facet hypertrophy 10/10/2018  . COPD (chronic obstructive pulmonary disease) with emphysema ([place]) 08/28/2018  . Elevated rheumatoid factor 08/13/2018  . Elevated C-reactive protein (CRP) 08/12/2018  . Elevated sed rate 08/12/2018  . Lumbar facet syndrome (Bilateral) 08/12/2018  . Back pain with left-sided radiculopathy 08/12/2018  . DDD (degenerative disc disease), lumbar 08/12/2018  . Chronic lower extremity pain (Primary Area of Pain) (Bilateral) (L>R) 07/23/2018  . Chronic low back pain (Secondary Area of Pain) (Bilateral) (L>R) w/ sciatica (Bilateral) 07/23/2018  . Chronic pain syndrome 07/23/2018  . Pharmacologic therapy 07/23/2018  . Disorder of skeletal system 07/23/2018  . Problems influencing health status 07/23/2018  . Chronic sacroiliac joint pain [facility] Area of Pain) (Bilateral) (L>R) 07/23/2018  . Status post lumboperitoneal shunt placement 02/26/2018  . 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 Tympanic membrane perforation 05/30/2016  . PMB (postmenopausal bleeding) 04/26/2016  . Rash 12/27/2015  . 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 Plantar fasciitis of foot (Left) 05/26/2015  . Spondylosis of lumbar region without myelopathy or radiculopathy 05/26/2015  . Lumbar foraminal stenosis (L3-4, L4-5, L5-S1) (L>R) 05/26/2015  . Spinal stenosis of  lumbar region with neurogenic claudication 05/26/2015  . Encounter for screening mammogram for malignant neoplasm of breast 12/02/2014  .  Goiter 12/02/2014  . Chronic idiopathic gout of foot (Right) 07/06/2014  . Biceps tendonitis (Right) 05/19/2014  . Shoulder pain (Right) 05/19/2014  . Facial weakness 05/05/2014  . Post herpetic neuralgia 05/05/2014  . Chronic dysfunction of left eustachian tube 03/24/2014  . Osteopenia 03/13/2014  . Lumbar Grade 1 Anterolisthesis of L3/4 (12m) & L4/5 (4-15m(w/ Dynamic instability) 03/13/2014  . Family history of colon cancer 12/02/2013  . Dyshidrotic eczema 11/17/2013  . Diastasis recti 04/08/2013  . Facial nerve paresis 07/31/2012  . Blepharospasm 05/08/2012  . 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 Obesity 03/28/2012  . Hemoptysis 12/22/2011  . Neuropathic postherpetic trigeminal neuralgia 12/12/2011  . Allergic rhinitis, mild 10/12/2011  . Disequilibrium syndrome 09/27/2011  . Chronic bronchitis ([place]09/24/2012  . Essential hypertension 05/24/2011  . Type 2 diabetes mellitus without complications ([id number]. Pulmonary nodules 05/05/2011   ThAndrey SpearmanMS, OTR/L, CLLasting Hope Recovery Center2/02/20 4:48 PM   CoLos IndiosAIN RELaurel Surgery And Endoscopy Center LLCERVICES [id number]: [phone] Fax:  [phone]: Susan BelongiaRN: [id number] of Birth: 02/16/29/1952

## 2019-08-13 NOTE — Therapy (Signed)
[place] MAIN [facility] SERVICES [address] [place], [place], [address] Phone: [phone]   Fax:  [phone]  Physical Therapy Treatment Physical Therapy Progress Note   Dates of reporting period 06/17/19   to   08/13/19  Patient Details  Name: Susan Callahan MRN: [id number] Date of Birth: November 07, 1950 No data recorded  Encounter Date: 08/13/2019  PT End of Session - 08/13/19 1500    Visit Number  70    Number of Visits  9    Date for PT Re-Evaluation  09/01/19    Authorization Type  10/10 Pn 10/6; next sesssion 1/10 PN 08/13/19    PT Start Time  1515    PT Stop Time  1559    PT Time Calculation (min)  44 min    Equipment Utilized During Treatment  Gait belt    Activity Tolerance  Patient tolerated treatment well    Behavior During Therapy  WFL for tasks assessed/performed       Past Medical History:  Diagnosis Date  . COPD (chronic obstructive pulmonary disease) ([place])   . Depression 1974  . Diabetes mellitus without complication ([place])   . Hypertension   . Migraine   . 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 LUMBAR PERITONEAL SHUNT    . TYMPANOSTOMY TUBE PLACEMENT      There were no vitals filed for this visit.  Subjective Assessment - 08/13/19 1606    Subjective  Patient reports she had an excellent week with no pain in her back, feeling some pain and radiating leg pain in L this week. 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    Pertinent History  Patient reports balance issues began in 2008. 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  Balance deficit has been constant since 2008, some days are worse than others but is always present to patient. Does not notice a directionality of loss of balance. Patient reports her balance is affecting her standing, walking on uneven surfaces such as outside, trying to walk with  dim light, showering and ADLs. Slight visual deficit of R lower quadrant. 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 Has nystagmus when looking to the R.  Back pain began in 2007 and has gradually worsened to nonstop pain. Medication helps briefly. Patient is challenged with standing with knees extended due to back pain.    Limitations  Lifting;Standing;Walking;House hold activities    How long can you stand comfortably?  pain begins immediately     How long can you walk comfortably?  pain begins immediately     Patient Stated Goals  walk better, have less pain, be able to walk in the grocery store with head turns to scan shelves    Currently in Pain?  Yes    Pain Score  3     Pain Location  Back    Pain Orientation  Lower    Pain Descriptors / Indicators  Radiating    Pain Type  Chronic pain    Pain Radiating Towards  LLE    Pain Onset  In the past 7 days    Pain Frequency  Intermittent         Patient reports she had an excellent week with no pain in her back, feeling some pain and radiating leg pain in L this week. 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   Prone: Roller to thoracolumbar paraspinals, piriformis, hamstrings, and calves x21mnutes  Supine:  Hamstring stretch 2x 60 seconds each LE, popliteal angle stretch 60 second holds Nerve glides 20x with leg on PT shoulder each LE Piriformis stretch LLE 60 second holds  Seated: Hamstring stretch (added to HEP) 60 seconds each LE Arms crossed marching with core activation and cueing for increased arc of motion   Standing: Opposite arm/leg raises for coordination and single leg stability 12x each LE Heel toe raises with UE support 12x Lateral modified lunge with UE support on back of chair 10x each side     Patient's condition has the potential to improve in response to therapy. Maximum improvement is yet to be obtained. The anticipated improvement is attainable and reasonable in a generally  predictable time.  Patient reports she is able to move better and is having less pain. Is noticing an improvement in her strength and balance as well.   Goals performed 08/04/19, please refer to this note for details on progress. Patient presents with some limited muscle tissue length of L paraspinals and LE. Patient educated on hamstring stretch and performed with cueing, added to HEP.  Patient's condition has the potential to improve in response to therapy. Maximum improvement is yet to be obtained. The anticipated improvement is attainable and reasonable in a generally predictable time. Patient will benefit from skilled physical therapy to increase balance, improve strength, decrease pain, and improve functional capacity of mobility for improved quality of life.                 PT Education - 08/13/19 1606    Education Details  exercise technique, manual, hamstring stretch    Person(s) Educated  Patient    Methods  Explanation;Demonstration;Tactile cues;Verbal cues    Comprehension  Verbalized understanding;Returned demonstration;Verbal cues required;Tactile cues required       PT Short Term Goals - 07/07/19 1217      PT SHORT TERM GOAL #1   Title  Patient will be independent with completion of HEP to improve ability to complete functional tasks and functional ADLs.    Baseline  10/23: will give HEP next session 11/19: HEP compliance    Time  2    Period  Weeks    Status  Achieved      PT SHORT TERM GOAL #2   Title  Patient will report pain score less than 4/10 on VAS to demonstrate reduction of pain levels with functional tasks.     Baseline  10/23: 6/10 11/19: 5/10 12/12: 9/10 1/10: 7/10  2/11: 3/10     Time  2    Period  Weeks    Status  Achieved        PT Long Term Goals - 08/04/19 1533      PT LONG TERM GOAL #1   Title  Patient will report pain score of <3/10 on VAS for decreased pain levels and ease with functional activities.    Baseline  10/23: 6/10 11/19:  5/10 12/12 9/10 1/10: 7/10 2/11: 3/10 3/9: 6/10  5/26: 8/10 03/17/19: 7/10 8/3: 5/10 8/25: 4/10 9/21: 4/10 10/26: 4/10 11/23: 3/10    Time  4    Period  Weeks    Status  Partially Met    Target Date  09/01/19      PT LONG TERM GOAL #2   Title  Patient will be able to ambulate  on treadmill for 35mnutes without increase in back/leg pain demonstrating improved community ambulation and treadmill walking at pulmonary rehab    Baseline  10/23: 072mutes increases pain 11/19: 67m34mtes 12/12: has not had the chance to perform 1/10: was able to do 5 minutes without pain 2/11: able to do 5 minutes then had to bend over 3/9: 5 minutes 30 seconds at 2.0 mphs 5/26: had to walk 10 minutes at cancer center with extreme pain but was able to perform 7/6: walks 10 minutes with increase of pain by 2 points 8/3: able to perform with bent over posture 9/21: able to perform 7 minutes with intervals of 1-2% incline    Time  4    Period  Weeks    Status  Achieved      PT LONG TERM GOAL #3   Title  Patient will increase dynamic gait index score to >19/24 as to demonstrate reduced fall risk and improved dynamic gait balance for better safety with community/home ambulation.    Baseline  10/1: 11/24 10/26: 18/24 11/23: 21/24    Time  4    Period  Weeks    Status  Achieved      PT LONG TERM GOAL #4   Title  Patient will score 4+/5 on BLE MMT to demonstrate improved glute strength and ability to carry groceries with increased ease up/down stairs at home.    Baseline  11/23: grossly 4+/5 with hip extension and adduction 4-/5 L df 4-/5 10/23: 4-/5 grossly 11/19: 4/5 grossly; hamstrings 4-/5 12/12: 4/5 gross 1/10: 4/5 gross 2/11: gross 4+/5 with hamstrings and abuductors 4-/5 3/9: gross 4+/5 hamstrings and abductors 4-/5 5/27: grossly 3+/5 with pain with LLE movements 7/6: grossly 4-/5 with pain with LLE movement 8/3: deferred due to swelling/pain 8/25: grossly 4/5 without pain 9/21: grossly 4/5 with hamstrings not tested due to  spasm    Time  4    Period  Weeks    Status  Partially Met    Target Date  09/01/19      PT LONG TERM GOAL #5   Title  Patient will increase six minute walk test distance to >1000 with minimal increase of pain for progression to community ambulator and improve gait ability    Baseline  10/1:580 ft stopped at 3: 58 due to excessive pain 10/26: 950 ft 11/23: 982 ft    Time  4    Period  Weeks    Status  Partially Met    Target Date  09/01/19      Additional Long Term Goals   Additional Long Term Goals  Yes      PT LONG TERM GOAL #6   Title  Patient will be able to make and/or strip the bed without pain and without needing rest breaks to perform iADLs independently.     Baseline  2/11: pain limits ability to perform, ~4 breaks required 3/9: takes her time, 3 breaks 5/27: very painful still 7/6: requires only one rest break 8/3: able to perform with +2 pain increase 8/25: no rest breaks required, increased pain 4 points 9/21: able to perform with +1-2 symptoms 10/26: able to perform with only occasional symptoms.    Time  4    Period  Weeks    Status  Achieved      PT LONG TERM GOAL #7   Title  Patient will have decreased modified oswestry score <20% for improved ability to complete household tasks with less pain levels.  Baseline  2/11: 30%  3/9: 30% 5/27: 63% 7/6: 38% 8/3: 42% 8/25: 36% 9/21: 30% 10/26: 26% 11/23: 16%    Time  4    Period  Weeks    Status  Achieved      PT LONG TERM GOAL #8   Title  Patient will increase Berg Balance score by > 6 points (50/56) to demonstrate decreased fall risk during functional activities.    Baseline  10/1: 44/56 10/26: 49/56 11/23: 54/56    Time  4    Period  Weeks    Status  Achieved      PT LONG TERM GOAL  #9   TITLE  Patient will increase dynamic gait index score to >23/24 as to demonstrate reduced fall risk and improved dynamic gait balance for better safety with community/home ambulation.    Baseline  11/23: 21/ 24    Time  4     Period  Weeks    Status  New    Target Date  09/01/19            Plan - 08/13/19 1604    Clinical Impression Statement  Goals performed 08/04/19, please refer to this note for details on progress. Patient presents with some limited muscle tissue length of L paraspinals and LE. Patient educated on hamstring stretch and performed with cueing, added to HEP.  Patient's condition has the potential to improve in response to therapy. Maximum improvement is yet to be obtained. The anticipated improvement is attainable and reasonable in a generally predictable time. Patient will benefit from skilled physical therapy to increase balance, improve strength, decrease pain, and improve functional capacity of mobility for improved quality of life.    Personal Factors and Comorbidities  Age;Comorbidity 3+;Fitness;Finances;Past/Current Experience;Social Background;Time since onset of injury/illness/exacerbation    Comorbidities  COPD, Ramsay Hunt Syndrome, HTN, DM, lymphedema, breast cancer    Examination-Activity Limitations  Bathing;Bend;Carry;Dressing;Hygiene/Grooming;Stairs;Squat;Reach Overhead;Locomotion Level;Lift;Stand;Transfers    Examination-Participation Restrictions  Cleaning;Community Activity;Laundry;Volunteer;Shop;Personal Finances;Yard Work    [employer]  Evolving/Moderate complexity    Rehab Potential  Fair    PT Frequency  1x / week    PT Duration  4 weeks    PT Treatment/Interventions  ADLs/Self Care Home Management;Cryotherapy;Moist Heat;Traction;Iontophoresis 75m/ml Dexamethasone;Ultrasound;Gait training;Stair training;Functional mobility training;Neuromuscular re-education;Balance training;Therapeutic exercise;Therapeutic activities;Patient/family education;Manual techniques;Energy conservation;Aquatic Therapy;Electrical Stimulation;Passive range of motion;Dry needling;Taping;Vestibular;Canalith Repostioning    PT Next Visit Plan  VOR, walking with head turns    PT  Home Exercise Plan  VOR    Consulted and Agree with Plan of Care  Patient       Patient will benefit from skilled therapeutic intervention in order to improve the following deficits and impairments:  Abnormal gait, Cardiopulmonary status limiting activity, Decreased activity tolerance, Decreased balance, Decreased endurance, Decreased range of motion, Difficulty walking, Decreased strength, Hypomobility, Decreased mobility, Impaired perceived functional ability, Impaired flexibility, Postural dysfunction, Obesity, Improper body mechanics, Pain, Dizziness, Increased edema  Visit Diagnosis: Chronic low back pain, unspecified back pain laterality, unspecified whether sciatica present  Muscle weakness (generalized)  Other abnormalities of gait and mobility  Unsteadiness on feet     Problem List Patient Active Problem List   Diagnosis Date Noted  . Edema 04/16/2019  . Postoperative wound infection 03/13/2019  . Seroma of breast 03/11/2019  . Age-related osteoporosis without current pathological fracture 02/27/2019  . Malignant neoplasm of central portion of right breast in female, estrogen receptor positive ([place] 02/12/2019  . Genetic testing 01/31/2019  . Malignant neoplasm of upper-inner  quadrant of right breast in female, estrogen receptor positive ([place]) 01/15/2019  . Long term current use of opiate analgesic 12/02/2018  . Tympanic membrane central perforation, left 11/04/2018  . DDD (degenerative disc disease), lumbosacral 10/10/2018  . Abnormal MRI, lumbar spine (08/29/2018) 10/10/2018  . 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 Lumbar nerve root impingement (Bilateral: L4 & L5) (L>R: L5) 10/10/2018  . Lumbar Spinal instability of L4-5 10/10/2018  . Lumbar facet hypertrophy 10/10/2018  . COPD (chronic obstructive pulmonary disease) with emphysema ([place]) 08/28/2018  . Elevated rheumatoid factor 08/13/2018  . Elevated C-reactive protein  (CRP) 08/12/2018  . Elevated sed rate 08/12/2018  . Lumbar facet syndrome (Bilateral) 08/12/2018  . Back pain with left-sided radiculopathy 08/12/2018  . DDD (degenerative disc disease), lumbar 08/12/2018  . Chronic lower extremity pain (Primary Area of Pain) (Bilateral) (L>R) 07/23/2018  . Chronic low back pain (Secondary Area of Pain) (Bilateral) (L>R) w/ sciatica (Bilateral) 07/23/2018  . Chronic pain syndrome 07/23/2018  . Pharmacologic therapy 07/23/2018  . Disorder of skeletal system 07/23/2018  . Problems influencing health status 07/23/2018  . Chronic sacroiliac joint pain [facility] Area of Pain) (Bilateral) (L>R) 07/23/2018  . Status post lumboperitoneal shunt placement 02/26/2018  . 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 Tympanic membrane perforation 05/30/2016  . PMB (postmenopausal bleeding) 04/26/2016  . Rash 12/27/2015  . 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 Plantar fasciitis of foot (Left) 05/26/2015  . Spondylosis of lumbar region without myelopathy or radiculopathy 05/26/2015  . Lumbar foraminal stenosis (L3-4, L4-5, L5-S1) (L>R) 05/26/2015  . Spinal stenosis of lumbar region with neurogenic claudication 05/26/2015  . Encounter for screening mammogram for malignant neoplasm of breast 12/02/2014  . Goiter 12/02/2014  . Chronic idiopathic gout of foot (Right) 07/06/2014  . Biceps tendonitis (Right) 05/19/2014  . Shoulder pain (Right) 05/19/2014  . Facial weakness 05/05/2014  . Post herpetic neuralgia 05/05/2014  . Chronic dysfunction of left eustachian tube 03/24/2014  . Osteopenia 03/13/2014  . Lumbar Grade 1 Anterolisthesis of L3/4 (83m) & L4/5 (4-126m(w/ Dynamic instability) 03/13/2014  . Family history of colon cancer 12/02/2013  . Dyshidrotic eczema 11/17/2013  . Diastasis recti 04/08/2013  . Facial nerve paresis 07/31/2012  . Blepharospasm 05/08/2012  . Conductive hearing loss of left  ear with unrestricted hearing of right ear 04/08/2012  . Obesity 03/28/2012  . Hemoptysis 12/22/2011  . Neuropathic postherpetic trigeminal neuralgia 12/12/2011  . Allergic rhinitis, mild 10/12/2011  . Disequilibrium syndrome 09/27/2011  . Chronic bronchitis ([place]09/24/2012  . Essential hypertension 05/24/2011  . Type 2 diabetes mellitus without complications ([id number]. Pulmonary nodules 05/05/2011   MaJanna ArchPT, DPT   08/13/2019, 4:07 PM  [place] REKear[facility] LLCERVICES [address]hone: [phone] Fax:  [phone]: LiCecil BixbyRN: [id number] of Birth: 02/1951/11/21

## 2019-08-18 ENCOUNTER — Other Ambulatory Visit: Payer: Self-pay

## 2019-08-18 ENCOUNTER — Ambulatory Visit: Payer: Medicare Other | Admitting: Occupational Therapy

## 2019-08-18 ENCOUNTER — Ambulatory Visit: Payer: Medicare Other

## 2019-08-18 DIAGNOSIS — I972 Postmastectomy lymphedema syndrome: Secondary | ICD-10-CM

## 2019-08-18 NOTE — Therapy (Signed)
[place] MAIN [facility] SERVICES [address] [place], [place], [address] Phone: [phone]   Fax:  [phone]  Occupational Therapy Treatment  Patient Details  Name: Susan Callahan MRN: [id number] Date of Birth: 10-15-1950 Referring Provider (OT): Humberto Leep, MD   Encounter Date: 08/18/2019  OT End of Session - 08/18/19 1510    Visit Number  52    Number of Visits  72    Date for OT Re-Evaluation  09/18/19    OT Start Time  0210    OT Stop Time  0310    OT Time Calculation (min)  60 min    Activity Tolerance  Patient tolerated treatment well;No increased pain    Behavior During Therapy  WFL for tasks assessed/performed       Past Medical History:  Diagnosis Date  . COPD (chronic obstructive pulmonary disease) ([place])   . Depression 1974  . Diabetes mellitus without complication ([place])   . Hypertension   . Migraine   . 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 LUMBAR PERITONEAL SHUNT    . TYMPANOSTOMY TUBE PLACEMENT      There were no vitals filed for this visit.  Subjective Assessment - 08/18/19 1507    Subjective   Pt presents for OT visit 52/72 (12) to address RUE/ RUQ post-mastectomy lymphedema (hand) and ongoing AWS. Pt reports pain 2/ 10.    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 01/30/19 partial mastectomy. Margins clear. 0/2 LN  negative for  disease. actinic keritosis,DDD, DM, obesity, thyroid nodules, pulmonary nodules, chronic back pain, HTN, COPD, hx depression    Limitations  pain and swelling in R axilla and arm, chronic back pain, muscle weakness, impaired balance, decreased RUE AROM, abnormal gait, difficulty walking, impaired functional mobility and transfers,    Repetition  Increases Symptoms    Special Tests  -Stemmer at MPs bilaterally    Patient Stated Goals  relduce pain and improve movement  in my R arm so I  can do things I need to do. reduce risk of lymphedema    Pain Onset  More than a month ago    Pain Onset  More than a month ago                   OT Treatments/Exercises (OP) - 08/18/19 0001      ADLs   ADL Education Given  Yes      Manual Therapy   Manual Therapy  Edema management    Soft tissue mobilization  MFR to R axilla and medial volar UE - no cord release observ    Myofascial Release  for cord release R axilla    Manual Lymphatic Drainage (MLD)  MLD to RUE/RUQ as established using cntalateral AAA    Compression Bandaging  Pt donns custom compression glove and sleeve with modified independence after session using nylon slippy and friction gloves.             OT Education - 08/18/19 1510    Education Details  Cont Pt edu for LE self care throughout session. Excellent return.    Person(s) Educated  Patient    Methods  Explanation;Handout;Demonstration    Comprehension  Verbalized understanding;Returned demonstration          OT Long Term Goals - 08/11/19 1500      OT LONG TERM GOAL #1  Title  Pt independent with modified lymphedema precautions and prevention strategies to limit LE progression.    Baseline  Max A    Time  4    Period  Days    Status  Achieved      OT LONG TERM GOAL #2   Title  Pt will be independent with lymphedema self-care home program and AWS ther ex to improve functional performance of basic and instrumental ADLs by DC.    Baseline  Max A    Time  12    Period  Weeks    Status  Achieved      OT LONG TERM GOAL #3   Title  Pt wil report 0/10 pain with RUE functional use ( reaching, lifting, carrying, dressing, dressing, etc) to achieve independent , pain free ADLs performance.    Baseline  Max A 05/05/19: Reporting 4/10 lymphadema pain now combined with AWS related pain. 05/22/19 AWS pain resolved. Pt now experiencing pain in hand and distal FA associated with recent onset of lymphedema.    Time  12    Period  Weeks    Status   Achieved      OT LONG TERM GOAL #4   Title  Pt will be able to apply gradient compression wraps using correct techniques independently to control UE swelling and limit LE progression.    Baseline  Max A    Time  1    Period  Weeks    Status  Achieved      OT LONG TERM GOAL #5   Title  After skilled trainng Pt will be able to don and doff  appropriate compression garments and/ or devices using correct techniques and assistive devices with extra time  by end of Intensive Phase CDT for optimal LE management to limit progression over time.    Baseline  Max A    Time  12    Period  Weeks    Status  Achieved      OT LONG TERM GOAL #6   Title  Due to onset of RUE swelling concentrated primarily in R hand during manual therapy for cording  Pt will achieve 5%, or greater RUE  limb volume reductions during Intensive Phase CDT to  improve ADLs performances, to reduce infection risk, and to limit LE progression.    Baseline  Max A    Time  12    Period  Weeks    Status  Partially Met   hand swelling continues to fluctuate           Plan - 08/18/19 1511    Clinical Impression Statement  Pt tolerated manual therapy including MLD , MFR for cord release and ending with MLD to decrease inflamation. Hand swelling persists over MPs. Arm swelling is well controlled. We'll check comparative limb volumetrics next session to measure volume reduction and distribution. Pt continues to be diligently compliant with compression garments and home program. Cont as per POC.    OT Occupational Profile and History  Comprehensive Assessment- Review of records and extensive additional review of physical, cognitive, psychosocial history related to current functional performance    Occupational performance deficits (Please refer to evaluation for details):  ADL's;IADL's;Social Participation;Leisure;Rest and Sleep;Work    Marketing executive / Function / Physical Skills  ADL;Decreased knowledge of precautions;ROM;UE  functional use;Decreased knowledge of use of DME;Edema;Skin integrity;Pain;IADL    Rehab Potential  Good    Clinical Decision Making  Several treatment options, min-mod task modification necessary  Comorbidities Affecting Occupational Performance:  Presence of comorbidities impacting occupational performance    Comorbidities impacting occupational performance description:  see SUBJUECTIVE    Modification or Assistance to Complete Evaluation   Min-Moderate modification of tasks or assist with assess necessary to complete eval    OT Frequency  2x / week    OT Duration  12 weeks    OT Treatment/Interventions  Self-care/ADL training;Therapeutic exercise;Manual lymph drainage;Patient/family education;Therapeutic activities;DME and/or AE instruction;Manual Therapy;Other (comment)   consider fitting with prophylactic cmopression arm sleeve   Plan  BUE AROM ther ex to reduce axillary cording- 2 sets of 10 2-3 x daily    Consulted and Agree with Plan of Care  Patient       Patient will benefit from skilled therapeutic intervention in order to improve the following deficits and impairments:   Body Structure / Function / Physical Skills: ADL, Decreased knowledge of precautions, ROM, UE functional use, Decreased knowledge of use of DME, Edema, Skin integrity, Pain, IADL       Visit Diagnosis: Postmastectomy lymphedema syndrome    Problem List Patient Active Problem List   Diagnosis Date Noted  . Edema 04/16/2019  . Postoperative wound infection 03/13/2019  . Seroma of breast 03/11/2019  . Age-related osteoporosis without current pathological fracture 02/27/2019  . Malignant neoplasm of central portion of right breast in female, estrogen receptor positive ([place]) 02/12/2019  . Genetic testing 01/31/2019  . Malignant neoplasm of upper-inner quadrant of right breast in female, estrogen receptor positive ([place]) 01/15/2019  . Long term current use of opiate analgesic 12/02/2018  . Tympanic  membrane central perforation, left 11/04/2018  . DDD (degenerative disc disease), lumbosacral 10/10/2018  . Abnormal MRI, lumbar spine (08/29/2018) 10/10/2018  . 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 Lumbar nerve root impingement (Bilateral: L4 & L5) (L>R: L5) 10/10/2018  . Lumbar Spinal instability of L4-5 10/10/2018  . Lumbar facet hypertrophy 10/10/2018  . COPD (chronic obstructive pulmonary disease) with emphysema ([place]) 08/28/2018  . Elevated rheumatoid factor 08/13/2018  . Elevated C-reactive protein (CRP) 08/12/2018  . Elevated sed rate 08/12/2018  . Lumbar facet syndrome (Bilateral) 08/12/2018  . Back pain with left-sided radiculopathy 08/12/2018  . DDD (degenerative disc disease), lumbar 08/12/2018  . Chronic lower extremity pain (Primary Area of Pain) (Bilateral) (L>R) 07/23/2018  . Chronic low back pain (Secondary Area of Pain) (Bilateral) (L>R) w/ sciatica (Bilateral) 07/23/2018  . Chronic pain syndrome 07/23/2018  . Pharmacologic therapy 07/23/2018  . Disorder of skeletal system 07/23/2018  . Problems influencing health status 07/23/2018  . Chronic sacroiliac joint pain [facility] Area of Pain) (Bilateral) (L>R) 07/23/2018  . Status post lumboperitoneal shunt placement 02/26/2018  . 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 Tympanic membrane perforation 05/30/2016  . PMB (postmenopausal bleeding) 04/26/2016  . Rash 12/27/2015  . 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 Plantar fasciitis of foot (Left) 05/26/2015  . Spondylosis of lumbar region without myelopathy or radiculopathy 05/26/2015  . Lumbar foraminal stenosis (L3-4, L4-5, L5-S1) (L>R) 05/26/2015  . Spinal stenosis of lumbar region with neurogenic claudication 05/26/2015  . Encounter for screening mammogram for malignant neoplasm of breast 12/02/2014  . Goiter 12/02/2014  . Chronic idiopathic gout of  foot (Right) 07/06/2014  . Biceps tendonitis (Right) 05/19/2014  . Shoulder pain (Right) 05/19/2014  . Facial weakness 05/05/2014  . Post herpetic neuralgia 05/05/2014  . Chronic dysfunction of left eustachian tube 03/24/2014  .  Osteopenia 03/13/2014  . Lumbar Grade 1 Anterolisthesis of L3/4 (73m) & L4/5 (4-176m(w/ Dynamic instability) 03/13/2014  . Family history of colon cancer 12/02/2013  . Dyshidrotic eczema 11/17/2013  . Diastasis recti 04/08/2013  . Facial nerve paresis 07/31/2012  . Blepharospasm 05/08/2012  . 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 Obesity 03/28/2012  . Hemoptysis 12/22/2011  . Neuropathic postherpetic trigeminal neuralgia 12/12/2011  . Allergic rhinitis, mild 10/12/2011  . Disequilibrium syndrome 09/27/2011  . Chronic bronchitis ([place]CC[place]09/24/2012  . Essential hypertension 05/24/2011  . Type 2 diabetes mellitus without complications (HCLuis [place]orens Torres0960/47/[id number]. Pulmonary nodules 05/05/2011    ThAndrey SpearmanMS, OTR/L, CLOverland Park Reg Med Ctr2/07/20 3:12 PM  CoVerlotAIN REWoodhams Laser And Lens Implant Center LLCERVICES [address]dCentral CityNC[place]hone: [phone] Fax:  [phone]: LiJaleeya McnellyRN: [id number] of Birth: 02/1951-08-06

## 2019-08-20 ENCOUNTER — Ambulatory Visit: Payer: Medicare Other

## 2019-08-20 ENCOUNTER — Ambulatory Visit: Payer: Medicare Other | Admitting: Occupational Therapy

## 2019-08-22 ENCOUNTER — Ambulatory Visit: Payer: Medicare Other | Admitting: Occupational Therapy

## 2019-08-22 ENCOUNTER — Other Ambulatory Visit: Payer: Self-pay

## 2019-08-22 ENCOUNTER — Encounter: Payer: Medicare Other | Admitting: Occupational Therapy

## 2019-08-22 DIAGNOSIS — I972 Postmastectomy lymphedema syndrome: Secondary | ICD-10-CM | POA: Diagnosis not present

## 2019-08-22 NOTE — Therapy (Signed)
[place] MAIN [facility] SERVICES [address] [place], [place], [address] Phone: [phone]   Fax:  [phone]  Occupational Therapy Treatment  Patient Details  Name: Susan Callahan MRN: [id number] Date of Birth: 1950-11-21 Referring Provider (OT): Humberto Leep, MD   Encounter Date: 08/22/2019  OT End of Session - 08/22/19 1110    Visit Number  53    Number of Visits  72    Date for OT Re-Evaluation  09/18/19    OT Start Time  1008    OT Stop Time  1108    OT Time Calculation (min)  60 min    Activity Tolerance  Patient tolerated treatment well;No increased pain    Behavior During Therapy  WFL for tasks assessed/performed       Past Medical History:  Diagnosis Date  . COPD (chronic obstructive pulmonary disease) ([place])   . Depression 1974  . Diabetes mellitus without complication ([place])   . Hypertension   . Migraine   . 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 LUMBAR PERITONEAL SHUNT    . TYMPANOSTOMY TUBE PLACEMENT      There were no vitals filed for this visit.  Subjective Assessment - 08/22/19 0955    Subjective   Pt presents for OT visit 53/72 (69) to address RUE/ RUQ post-mastectomy lymphedema (hand) and ongoing AWS. Pt reports pain in palm in R hand and and pain at MPS of L hand.    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 01/30/19 partial mastectomy. Margins clear. 0/2 LN  negative for  disease. actinic keritosis,DDD, DM, obesity, thyroid nodules, pulmonary nodules, chronic back pain, HTN, COPD, hx depression    Limitations  pain and swelling in R axilla and arm, chronic back pain, muscle weakness, impaired balance, decreased RUE AROM, abnormal gait, difficulty walking, impaired functional mobility and transfers,    Repetition  Increases Symptoms    Special Tests  -Stemmer at MPs bilaterally    Patient Stated Goals  relduce  pain and improve movement  in my R arm so I can do things I need to do. reduce risk of lymphedema    Pain Onset  More than a month ago    Pain Onset  More than a month ago                   OT Treatments/Exercises (OP) - 08/22/19 0001      ADLs   ADL Education Given  Yes      Manual Therapy   Manual Therapy  Edema management;Myofascial release;Manual Lymphatic Drainage (MLD);Compression Bandaging    Soft tissue mobilization  MFR to R axilla and medial volar UE - no cord release observ    Myofascial Release  for cord release R axilla    Manual Lymphatic Drainage (MLD)  MLD to RUE/RUQ as established using cntalateral AAA    Compression Bandaging  Pt donns custom compression glove and sleeve with modified independence after session using nylon slippy and friction gloves.             OT Education - 08/22/19 0956    Education Details  Cont Pt edu for LE self care throughout session. Excellent return.    Person(s) Educated  Patient    Methods  Explanation;Handout;Demonstration    Comprehension  Verbalized understanding;Returned demonstration          OT  Long Term Goals - 08/11/19 1500      OT LONG TERM GOAL #1   Title  Pt independent with modified lymphedema precautions and prevention strategies to limit LE progression.    Baseline  Max A    Time  4    Period  Days    Status  Achieved      OT LONG TERM GOAL #2   Title  Pt will be independent with lymphedema self-care home program and AWS ther ex to improve functional performance of basic and instrumental ADLs by DC.    Baseline  Max A    Time  12    Period  Weeks    Status  Achieved      OT LONG TERM GOAL #3   Title  Pt wil report 0/10 pain with RUE functional use ( reaching, lifting, carrying, dressing, dressing, etc) to achieve independent , pain free ADLs performance.    Baseline  Max A 05/05/19: Reporting 4/10 lymphadema pain now combined with AWS related pain. 05/22/19 AWS pain resolved. Pt now  experiencing pain in hand and distal FA associated with recent onset of lymphedema.    Time  12    Period  Weeks    Status  Achieved      OT LONG TERM GOAL #4   Title  Pt will be able to apply gradient compression wraps using correct techniques independently to control UE swelling and limit LE progression.    Baseline  Max A    Time  1    Period  Weeks    Status  Achieved      OT LONG TERM GOAL #5   Title  After skilled trainng Pt will be able to don and doff  appropriate compression garments and/ or devices using correct techniques and assistive devices with extra time  by end of Intensive Phase CDT for optimal LE management to limit progression over time.    Baseline  Max A    Time  12    Period  Weeks    Status  Achieved      OT LONG TERM GOAL #6   Title  Due to onset of RUE swelling concentrated primarily in R hand during manual therapy for cording  Pt will achieve 5%, or greater RUE  limb volume reductions during Intensive Phase CDT to  improve ADLs performances, to reduce infection risk, and to limit LE progression.    Baseline  Max A    Time  12    Period  Weeks    Status  Partially Met   hand swelling continues to fluctuate           Plan - 08/22/19 1112    Clinical Impression Statement  Provided MLD and MFR without increased pain / discomfort. Cords           in R axilla, antecubital fossa and medial fa persist and are easily palpable and visible. They continue to cause pain  with all activities reqwuiring reaching  including dressing, bathing, grooming, home management, and sometimes driving. Pt demonstrates progress towards all goals. She is diligent with daytime compression garments and ther ex. She is exploring funding for an advanced sequential pneumatic compression device , or "pump" (recommend [employer] Flexitouch) and has upcoming consult with plastic surgeon to explore surgical remedy for cords. Pt will benefit from ongoing OT to reduce and manage hand and  limb swelling and to reduce pain at end range of motion for L  shoulder extension , elbow and wrist extension when reaching, lifting, pulling and pushing (aka dressing, driving , bathing, grooming, housework, shopping, grooming).    OT Occupational Profile and History  Comprehensive Assessment- Review of records and extensive additional review of physical, cognitive, psychosocial history related to current functional performance    Occupational performance deficits (Please refer to evaluation for details):  ADL's;IADL's;Social Participation;Leisure;Rest and Sleep;Work    Marketing executive / Function / Physical Skills  ADL;Decreased knowledge of precautions;ROM;UE functional use;Decreased knowledge of use of DME;Edema;Skin integrity;Pain;IADL    Rehab Potential  Good    Clinical Decision Making  Several treatment options, min-mod task modification necessary    Comorbidities Affecting Occupational Performance:  Presence of comorbidities impacting occupational performance    Comorbidities impacting occupational performance description:  see SUBJUECTIVE    Modification or Assistance to Complete Evaluation   Min-Moderate modification of tasks or assist with assess necessary to complete eval    OT Frequency  2x / week    OT Duration  12 weeks    OT Treatment/Interventions  Self-care/ADL training;Therapeutic exercise;Manual lymph drainage;Patient/family education;Therapeutic activities;DME and/or AE instruction;Manual Therapy;Other (comment)   consider fitting with prophylactic cmopression arm sleeve   Plan  BUE AROM ther ex to reduce axillary cording- 2 sets of 10 2-3 x daily    Consulted and Agree with Plan of Care  Patient       Patient will benefit from skilled therapeutic intervention in order to improve the following deficits and impairments:   Body Structure / Function / Physical Skills: ADL, Decreased knowledge of precautions, ROM, UE functional use, Decreased knowledge of use of DME, Edema, Skin  integrity, Pain, IADL       Visit Diagnosis: Postmastectomy lymphedema syndrome    Problem List Patient Active Problem List   Diagnosis Date Noted  . Edema 04/16/2019  . Postoperative wound infection 03/13/2019  . Seroma of breast 03/11/2019  . Age-related osteoporosis without current pathological fracture 02/27/2019  . Malignant neoplasm of central portion of right breast in female, estrogen receptor positive ([place]) 02/12/2019  . Genetic testing 01/31/2019  . Malignant neoplasm of upper-inner quadrant of right breast in female, estrogen receptor positive ([place]) 01/15/2019  . Long term current use of opiate analgesic 12/02/2018  . Tympanic membrane central perforation, left 11/04/2018  . DDD (degenerative disc disease), lumbosacral 10/10/2018  . Abnormal MRI, lumbar spine (08/29/2018) 10/10/2018  . 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 Lumbar nerve root impingement (Bilateral: L4 & L5) (L>R: L5) 10/10/2018  . Lumbar Spinal instability of L4-5 10/10/2018  . Lumbar facet hypertrophy 10/10/2018  . COPD (chronic obstructive pulmonary disease) with emphysema ([place]) 08/28/2018  . Elevated rheumatoid factor 08/13/2018  . Elevated C-reactive protein (CRP) 08/12/2018  . Elevated sed rate 08/12/2018  . Lumbar facet syndrome (Bilateral) 08/12/2018  . Back pain with left-sided radiculopathy 08/12/2018  . DDD (degenerative disc disease), lumbar 08/12/2018  . Chronic lower extremity pain (Primary Area of Pain) (Bilateral) (L>R) 07/23/2018  . Chronic low back pain (Secondary Area of Pain) (Bilateral) (L>R) w/ sciatica (Bilateral) 07/23/2018  . Chronic pain syndrome 07/23/2018  . Pharmacologic therapy 07/23/2018  . Disorder of skeletal system 07/23/2018  . Problems influencing health status 07/23/2018  . Chronic sacroiliac joint pain [facility] Area of Pain) (Bilateral) (L>R) 07/23/2018  . Status post lumboperitoneal shunt placement 02/26/2018  .  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 Tympanic membrane perforation 05/30/2016  . PMB (postmenopausal bleeding) 04/26/2016  .  Rash 12/27/2015  . 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 Plantar fasciitis of foot (Left) 05/26/2015  . Spondylosis of lumbar region without myelopathy or radiculopathy 05/26/2015  . Lumbar foraminal stenosis (L3-4, L4-5, L5-S1) (L>R) 05/26/2015  . Spinal stenosis of lumbar region with neurogenic claudication 05/26/2015  . Encounter for screening mammogram for malignant neoplasm of breast 12/02/2014  . Goiter 12/02/2014  . Chronic idiopathic gout of foot (Right) 07/06/2014  . Biceps tendonitis (Right) 05/19/2014  . Shoulder pain (Right) 05/19/2014  . Facial weakness 05/05/2014  . Post herpetic neuralgia 05/05/2014  . Chronic dysfunction of left eustachian tube 03/24/2014  . Osteopenia 03/13/2014  . Lumbar Grade 1 Anterolisthesis of L3/4 (25m) & L4/5 (4-151m(w/ Dynamic instability) 03/13/2014  . Family history of colon cancer 12/02/2013  . Dyshidrotic eczema 11/17/2013  . Diastasis recti 04/08/2013  . Facial nerve paresis 07/31/2012  . Blepharospasm 05/08/2012  . 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 Obesity 03/28/2012  . Hemoptysis 12/22/2011  . Neuropathic postherpetic trigeminal neuralgia 12/12/2011  . Allergic rhinitis, mild 10/12/2011  . Disequilibrium syndrome 09/27/2011  . Chronic bronchitis (HCGrantville09/24/2012  . Essential hypertension 05/24/2011  . Type 2 diabetes mellitus without complications ([id number]. Pulmonary nodules 05/05/2011    ThAndrey SpearmanMS, OTR/L, CLAdventist Health Tulare Regional [facility] Center2/11/20 11:16 AM  CoDundasAIN REUniversity Of [facility] HospitalERVICES [address]dNew PlymouthNC[place]hone: [phone] Fax:  [phone]: LiAnayelli LaiRN: [id number] of Birth: [birth year]

## 2019-08-22 NOTE — Therapy (Signed)
[place] MAIN [facility] SERVICES [address] [place], [place], [address] Phone: [phone]   Fax:  [phone]  Occupational Therapy Treatment  Patient Details  Name: Analya Louissaint MRN: [id number] Date of Birth: 11-08-1950 Referring Provider (OT): Humberto Leep, MD   Encounter Date: 08/22/2019  OT End of Session - 08/22/19 1110    Visit Number  53    Number of Visits  72    Date for OT Re-Evaluation  09/18/19    OT Start Time  1008    OT Stop Time  1108    OT Time Calculation (min)  60 min    Activity Tolerance  Patient tolerated treatment well;No increased pain    Behavior During Therapy  WFL for tasks assessed/performed       Past Medical History:  Diagnosis Date  . COPD (chronic obstructive pulmonary disease) ([place])   . Depression 1974  . Diabetes mellitus without complication ([place])   . Hypertension   . Migraine   . 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 LUMBAR PERITONEAL SHUNT    . TYMPANOSTOMY TUBE PLACEMENT      There were no vitals filed for this visit.  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 Pt reports pain in palm in R hand and and pain at MPS of L hand.    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 01/30/19 partial mastectomy. Margins clear. 0/2 LN  negative for  disease. actinic keritosis,DDD, DM, obesity, thyroid nodules, pulmonary nodules, chronic back pain, HTN, COPD, hx depression    Limitations  pain and swelling in R axilla and arm, chronic back pain, muscle weakness, impaired balance, decreased RUE AROM, abnormal gait, difficulty walking, impaired functional mobility and transfers,    Repetition  Increases Symptoms    Special Tests  -Stemmer at MPs bilaterally    Patient Stated Goals  relduce  pain and improve movement  in my R arm so I can do things I need to do. reduce risk of lymphedema    Pain Onset  More than a month ago    Pain Onset  More than a month ago                   OT Treatments/Exercises (OP) - 08/22/19 0001      ADLs   ADL Education Given  Yes      Manual Therapy   Manual Therapy  Edema management;Myofascial release;Manual Lymphatic Drainage (MLD);Compression Bandaging    Soft tissue mobilization  MFR to R axilla and medial volar UE - no cord release observ    Myofascial Release  for cord release R axilla    Manual Lymphatic Drainage (MLD)  MLD to RUE/RUQ as established using cntalateral AAA    Compression Bandaging  Pt donns custom compression glove and sleeve with modified independence after session using nylon slippy and friction gloves.             OT Education - 08/22/19 0956    Education Details  Cont Pt edu for LE self care throughout session. Excellent return.    Person(s) Educated  Patient    Methods  Explanation;Handout;Demonstration    Comprehension  Verbalized understanding;Returned demonstration          OT  Long Term Goals - 08/11/19 1500      OT LONG TERM GOAL #1   Title  Pt independent with modified lymphedema precautions and prevention strategies to limit LE progression.    Baseline  Max A    Time  4    Period  Days    Status  Achieved      OT LONG TERM GOAL #2   Title  Pt will be independent with lymphedema self-care home program and AWS ther ex to improve functional performance of basic and instrumental ADLs by DC.    Baseline  Max A    Time  12    Period  Weeks    Status  Achieved      OT LONG TERM GOAL #3   Title  Pt wil report 0/10 pain with RUE functional use ( reaching, lifting, carrying, dressing, dressing, etc) to achieve independent , pain free ADLs performance.    Baseline  Max A 05/05/19: Reporting 4/10 lymphadema pain now combined with AWS related pain. 05/22/19 AWS pain resolved. Pt now  experiencing pain in hand and distal FA associated with recent onset of lymphedema.    Time  12    Period  Weeks    Status  Achieved      OT LONG TERM GOAL #4   Title  Pt will be able to apply gradient compression wraps using correct techniques independently to control UE swelling and limit LE progression.    Baseline  Max A    Time  1    Period  Weeks    Status  Achieved      OT LONG TERM GOAL #5   Title  After skilled trainng Pt will be able to don and doff  appropriate compression garments and/ or devices using correct techniques and assistive devices with extra time  by end of Intensive Phase CDT for optimal LE management to limit progression over time.    Baseline  Max A    Time  12    Period  Weeks    Status  Achieved      OT LONG TERM GOAL #6   Title  Due to onset of RUE swelling concentrated primarily in R hand during manual therapy for cording  Pt will achieve 5%, or greater RUE  limb volume reductions during Intensive Phase CDT to  improve ADLs performances, to reduce infection risk, and to limit LE progression.    Baseline  Max A    Time  12    Period  Weeks    Status  Partially Met   hand swelling continues to fluctuate           Plan - 08/22/19 1112    Clinical Impression Statement  Provided MLD and MFR without increased pain / discomfort. Cords           in R axilla, antecubital fossa and medial fa persist and are easily palpable and visible. They continue to cause pain  with all activities reqwuiring reaching  including dressing, bathing, grooming, home management, and sometimes driving. Pt demonstrates progress towards all goals. She is diligent with daytime compression garments and ther ex. She is exploring funding for an advanced sequential pneumatic compression device , or "pump" (recommend [employer] Flexitouch) and has upcoming consult with plastic surgeon to explore surgical remedy for cords. Pt will benefit from ongoing OT to reduce and manage hand and  limb swelling and to reduce pain at end range of motion for L  shoulder extension , elbow and wrist extension when reaching, lifting, pulling and pushing (aka dressing, driving , bathing, grooming, housework, shopping, grooming).    OT Occupational Profile and History  Comprehensive Assessment- Review of records and extensive additional review of physical, cognitive, psychosocial history related to current functional performance    Occupational performance deficits (Please refer to evaluation for details):  ADL's;IADL's;Social Participation;Leisure;Rest and Sleep;Work    Marketing executive / Function / Physical Skills  ADL;Decreased knowledge of precautions;ROM;UE functional use;Decreased knowledge of use of DME;Edema;Skin integrity;Pain;IADL    Rehab Potential  Good    Clinical Decision Making  Several treatment options, min-mod task modification necessary    Comorbidities Affecting Occupational Performance:  Presence of comorbidities impacting occupational performance    Comorbidities impacting occupational performance description:  see SUBJUECTIVE    Modification or Assistance to Complete Evaluation   Min-Moderate modification of tasks or assist with assess necessary to complete eval    OT Frequency  2x / week    OT Duration  12 weeks    OT Treatment/Interventions  Self-care/ADL training;Therapeutic exercise;Manual lymph drainage;Patient/family education;Therapeutic activities;DME and/or AE instruction;Manual Therapy;Other (comment)   consider fitting with prophylactic cmopression arm sleeve   Plan  BUE AROM ther ex to reduce axillary cording- 2 sets of 10 2-3 x daily    Consulted and Agree with Plan of Care  Patient       Patient will benefit from skilled therapeutic intervention in order to improve the following deficits and impairments:   Body Structure / Function / Physical Skills: ADL, Decreased knowledge of precautions, ROM, UE functional use, Decreased knowledge of use of DME, Edema, Skin  integrity, Pain, IADL       Visit Diagnosis: Postmastectomy lymphedema syndrome    Problem List Patient Active Problem List   Diagnosis Date Noted  . Edema 04/16/2019  . Postoperative wound infection 03/13/2019  . Seroma of breast 03/11/2019  . Age-related osteoporosis without current pathological fracture 02/27/2019  . Malignant neoplasm of central portion of right breast in female, estrogen receptor positive ([place]) 02/12/2019  . Genetic testing 01/31/2019  . Malignant neoplasm of upper-inner quadrant of right breast in female, estrogen receptor positive ([place]) 01/15/2019  . Long term current use of opiate analgesic 12/02/2018  . Tympanic membrane central perforation, left 11/04/2018  . DDD (degenerative disc disease), lumbosacral 10/10/2018  . Abnormal MRI, lumbar spine (08/29/2018) 10/10/2018  . 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 Lumbar nerve root impingement (Bilateral: L4 & L5) (L>R: L5) 10/10/2018  . Lumbar Spinal instability of L4-5 10/10/2018  . Lumbar facet hypertrophy 10/10/2018  . COPD (chronic obstructive pulmonary disease) with emphysema ([place]) 08/28/2018  . Elevated rheumatoid factor 08/13/2018  . Elevated C-reactive protein (CRP) 08/12/2018  . Elevated sed rate 08/12/2018  . Lumbar facet syndrome (Bilateral) 08/12/2018  . Back pain with left-sided radiculopathy 08/12/2018  . DDD (degenerative disc disease), lumbar 08/12/2018  . Chronic lower extremity pain (Primary Area of Pain) (Bilateral) (L>R) 07/23/2018  . Chronic low back pain (Secondary Area of Pain) (Bilateral) (L>R) w/ sciatica (Bilateral) 07/23/2018  . Chronic pain syndrome 07/23/2018  . Pharmacologic therapy 07/23/2018  . Disorder of skeletal system 07/23/2018  . Problems influencing health status 07/23/2018  . Chronic sacroiliac joint pain [facility] Area of Pain) (Bilateral) (L>R) 07/23/2018  . Status post lumboperitoneal shunt placement 02/26/2018  .  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 Tympanic membrane perforation 05/30/2016  . PMB (postmenopausal bleeding) 04/26/2016  .  Rash 12/27/2015  . 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 Plantar fasciitis of foot (Left) 05/26/2015  . Spondylosis of lumbar region without myelopathy or radiculopathy 05/26/2015  . Lumbar foraminal stenosis (L3-4, L4-5, L5-S1) (L>R) 05/26/2015  . Spinal stenosis of lumbar region with neurogenic claudication 05/26/2015  . Encounter for screening mammogram for malignant neoplasm of breast 12/02/2014  . Goiter 12/02/2014  . Chronic idiopathic gout of foot (Right) 07/06/2014  . Biceps tendonitis (Right) 05/19/2014  . Shoulder pain (Right) 05/19/2014  . Facial weakness 05/05/2014  . Post herpetic neuralgia 05/05/2014  . Chronic dysfunction of left eustachian tube 03/24/2014  . Osteopenia 03/13/2014  . Lumbar Grade 1 Anterolisthesis of L3/4 (15m) & L4/5 (4-139m(w/ Dynamic instability) 03/13/2014  . Family history of colon cancer 12/02/2013  . Dyshidrotic eczema 11/17/2013  . Diastasis recti 04/08/2013  . Facial nerve paresis 07/31/2012  . Blepharospasm 05/08/2012  . 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 Obesity 03/28/2012  . Hemoptysis 12/22/2011  . Neuropathic postherpetic trigeminal neuralgia 12/12/2011  . Allergic rhinitis, mild 10/12/2011  . Disequilibrium syndrome 09/27/2011  . Chronic bronchitis (HCAlexandria09/24/2012  . Essential hypertension 05/24/2011  . Type 2 diabetes mellitus without complications (HCRobins [id number]. Pulmonary nodules 05/05/2011    ThAndrey SpearmanMS, OTR/L, CLHarney District Hospital2/11/20 11:16 AM   CoMontandonAIN REHelen Newberry Joy HospitalERVICES [address][id number]: [phone] Fax:  [phone]: LiNubia ZiesmerRN: [id number] of Birth: 02/1951-08-03

## 2019-08-25 ENCOUNTER — Ambulatory Visit: Payer: Medicare Other

## 2019-08-25 ENCOUNTER — Ambulatory Visit: Payer: Medicare Other | Admitting: Occupational Therapy

## 2019-08-25 ENCOUNTER — Other Ambulatory Visit: Payer: Self-pay

## 2019-08-25 DIAGNOSIS — I972 Postmastectomy lymphedema syndrome: Secondary | ICD-10-CM

## 2019-08-26 NOTE — Therapy (Signed)
[place] MAIN [facility] SERVICES [address] [place], [place], [address] Phone: [phone]   Fax:  [phone]  Occupational Therapy Treatment  Patient Details  Name: Susan Callahan MRN: [id number] Date of Birth: 01-06-1951 Referring Provider (OT): Humberto Leep, MD   Encounter Date: 08/25/2019  OT End of Session - 08/25/19 1516    Visit Number  54    Number of Visits  72    Date for OT Re-Evaluation  09/18/19    OT Start Time  0212    OT Stop Time  0310    OT Time Calculation (min)  58 min    Activity Tolerance  Patient tolerated treatment well;No increased pain    Behavior During Therapy  WFL for tasks assessed/performed       Past Medical History:  Diagnosis Date  . COPD (chronic obstructive pulmonary disease) ([place])   . Depression 1974  . Diabetes mellitus without complication ([place])   . Hypertension   . Migraine   . 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 LUMBAR PERITONEAL SHUNT    . TYMPANOSTOMY TUBE PLACEMENT      There were no vitals filed for this visit.  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 Pt denies LUE/ LUQ pain. Pt continues to c/o re AWS related pain at end ranges of R shoulder  abduction combined with elbow and wrist extension.                           OT Education - 08/26/19 1515    Education Details  Cont Pt edu for LE self care throughout session. Excellent return.          OT Long Term Goals - 08/11/19 1500      OT LONG TERM GOAL #1   Title  Pt independent with modified lymphedema precautions and prevention strategies to limit LE progression.    Baseline  Max A    Time  4    Period  Days    Status  Achieved      OT LONG TERM GOAL #2   Title  Pt will be independent with lymphedema self-care home program  and AWS ther ex to improve functional performance of basic and instrumental ADLs by DC.    Baseline  Max A    Time  12    Period  Weeks    Status  Achieved      OT LONG TERM GOAL #3   Title  Pt wil report 0/10 pain with RUE functional use ( reaching, lifting, carrying, dressing, dressing, etc) to achieve independent , pain free ADLs performance.    Baseline  Max A 05/05/19: Reporting 4/10 lymphadema pain now combined with AWS related pain. 05/22/19 AWS pain resolved. Pt now experiencing pain in hand and distal FA associated with recent onset of lymphedema.    Time  12    Period  Weeks    Status  Achieved      OT LONG TERM GOAL #4   Title  Pt will be able to apply gradient compression wraps using correct techniques independently to control UE swelling and limit LE progression.    Baseline  Max A    Time  1    Period  Weeks  Status  Achieved      OT LONG TERM GOAL #5   Title  After skilled trainng Pt will be able to don and doff  appropriate compression garments and/ or devices using correct techniques and assistive devices with extra time  by end of Intensive Phase CDT for optimal LE management to limit progression over time.    Baseline  Max A    Time  12    Period  Weeks    Status  Achieved      OT LONG TERM GOAL #6   Title  Due to onset of RUE swelling concentrated primarily in R hand during manual therapy for cording  Pt will achieve 5%, or greater RUE  limb volume reductions during Intensive Phase CDT to  improve ADLs performances, to reduce infection risk, and to limit LE progression.    Baseline  Max A    Time  12    Period  Weeks    Status  Partially Met   hand swelling continues to fluctuate           Plan - 08/26/19 1517    Clinical Impression Statement  Provided MLD and MFR without increased pain / discomfort. Cords           in R axilla, antecubital fossa and medial fa persist and are easily palpable and visible. They continue to cause pain  with all activities  reqwuiring reaching  including dressing, bathing, grooming, home management, and sometimes driving. Pt demonstrates progress towards all goals. She is diligent with daytime compression garments and ther ex. She is exploring funding for an advanced sequential pneumatic compression device , or "pump" (recommend [employer] Flexitouch) and has upcoming consult with plastic surgeon to explore surgical remedy for cords. Pt will benefit from ongoing OT to reduce and manage hand and limb swelling. After meeting with surgeon on 12/ 18 we will define new goals and POC.    OT Occupational Profile and History  Comprehensive Assessment- Review of records and extensive additional review of physical, cognitive, psychosocial history related to current functional performance    Occupational performance deficits (Please refer to evaluation for details):  ADL's;IADL's;Social Participation;Leisure;Rest and Sleep;Work    Marketing executive / Function / Physical Skills  ADL;Decreased knowledge of precautions;ROM;UE functional use;Decreased knowledge of use of DME;Edema;Skin integrity;Pain;IADL    Rehab Potential  Good    Clinical Decision Making  Several treatment options, min-mod task modification necessary    Comorbidities Affecting Occupational Performance:  Presence of comorbidities impacting occupational performance    Comorbidities impacting occupational performance description:  see SUBJUECTIVE    Modification or Assistance to Complete Evaluation   Min-Moderate modification of tasks or assist with assess necessary to complete eval    OT Frequency  2x / week    OT Duration  12 weeks    OT Treatment/Interventions  Self-care/ADL training;Therapeutic exercise;Manual lymph drainage;Patient/family education;Therapeutic activities;DME and/or AE instruction;Manual Therapy;Other (comment)   consider fitting with prophylactic cmopression arm sleeve   Plan  BUE AROM ther ex to reduce axillary cording- 2 sets of 10 2-3 x daily     Consulted and Agree with Plan of Care  Patient       Patient will benefit from skilled therapeutic intervention in order to improve the following deficits and impairments:   Body Structure / Function / Physical Skills: ADL, Decreased knowledge of precautions, ROM, UE functional use, Decreased knowledge of use of DME, Edema, Skin integrity, Pain, IADL  Visit Diagnosis: Postmastectomy lymphedema syndrome    Problem List Patient Active Problem List   Diagnosis Date Noted  . Edema 04/16/2019  . Postoperative wound infection 03/13/2019  . Seroma of breast 03/11/2019  . Age-related osteoporosis without current pathological fracture 02/27/2019  . Malignant neoplasm of central portion of right breast in female, estrogen receptor positive ([place]) 02/12/2019  . Genetic testing 01/31/2019  . Malignant neoplasm of upper-inner quadrant of right breast in female, estrogen receptor positive ([place]) 01/15/2019  . Long term current use of opiate analgesic 12/02/2018  . Tympanic membrane central perforation, left 11/04/2018  . DDD (degenerative disc disease), lumbosacral 10/10/2018  . Abnormal MRI, lumbar spine (08/29/2018) 10/10/2018  . 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 Lumbar nerve root impingement (Bilateral: L4 & L5) (L>R: L5) 10/10/2018  . Lumbar Spinal instability of L4-5 10/10/2018  . Lumbar facet hypertrophy 10/10/2018  . COPD (chronic obstructive pulmonary disease) with emphysema ([place]) 08/28/2018  . Elevated rheumatoid factor 08/13/2018  . Elevated C-reactive protein (CRP) 08/12/2018  . Elevated sed rate 08/12/2018  . Lumbar facet syndrome (Bilateral) 08/12/2018  . Back pain with left-sided radiculopathy 08/12/2018  . DDD (degenerative disc disease), lumbar 08/12/2018  . Chronic lower extremity pain (Primary Area of Pain) (Bilateral) (L>R) 07/23/2018  . Chronic low back pain (Secondary Area of Pain) (Bilateral) (L>R) w/ sciatica  (Bilateral) 07/23/2018  . Chronic pain syndrome 07/23/2018  . Pharmacologic therapy 07/23/2018  . Disorder of skeletal system 07/23/2018  . Problems influencing health status 07/23/2018  . Chronic sacroiliac joint pain [facility] Area of Pain) (Bilateral) (L>R) 07/23/2018  . Status post lumboperitoneal shunt placement 02/26/2018  . 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 Tympanic membrane perforation 05/30/2016  . PMB (postmenopausal bleeding) 04/26/2016  . Rash 12/27/2015  . 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 Plantar fasciitis of foot (Left) 05/26/2015  . Spondylosis of lumbar region without myelopathy or radiculopathy 05/26/2015  . Lumbar foraminal stenosis (L3-4, L4-5, L5-S1) (L>R) 05/26/2015  . Spinal stenosis of lumbar region with neurogenic claudication 05/26/2015  . Encounter for screening mammogram for malignant neoplasm of breast 12/02/2014  . Goiter 12/02/2014  . Chronic idiopathic gout of foot (Right) 07/06/2014  . Biceps tendonitis (Right) 05/19/2014  . Shoulder pain (Right) 05/19/2014  . Facial weakness 05/05/2014  . Post herpetic neuralgia 05/05/2014  . Chronic dysfunction of left eustachian tube 03/24/2014  . Osteopenia 03/13/2014  . Lumbar Grade 1 Anterolisthesis of L3/4 (82m) & L4/5 (4-153m(w/ Dynamic instability) 03/13/2014  . Family history of colon cancer 12/02/2013  . Dyshidrotic eczema 11/17/2013  . Diastasis recti 04/08/2013  . Facial nerve paresis 07/31/2012  . Blepharospasm 05/08/2012  . 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 Obesity 03/28/2012  . Hemoptysis 12/22/2011  . Neuropathic postherpetic trigeminal neuralgia 12/12/2011  . Allergic rhinitis, mild 10/12/2011  . Disequilibrium syndrome 09/27/2011  . Chronic bronchitis (HCOolitic09/24/2012  . Essential hypertension 05/24/2011  . Type 2 diabetes mellitus without complications  ([id number]. Pulmonary nodules 05/05/2011    ThAndrey SpearmanMS, OTR/L, CLKindred Hospital - Central Chicago2/15/20 3:18 PM  CoTwiningAIN RESt Anthonys HospitalERVICES [address]dStotts [id number]: [phone] Fax:  [phone]: Susan KulagaRN: [id number] of Birth: 6/16-Nov-1950

## 2019-08-27 ENCOUNTER — Ambulatory Visit: Payer: Medicare Other

## 2019-08-27 ENCOUNTER — Other Ambulatory Visit: Payer: Self-pay

## 2019-08-27 ENCOUNTER — Ambulatory Visit: Payer: Medicare Other | Admitting: Occupational Therapy

## 2019-08-27 DIAGNOSIS — I972 Postmastectomy lymphedema syndrome: Secondary | ICD-10-CM | POA: Diagnosis not present

## 2019-08-27 DIAGNOSIS — R2689 Other abnormalities of gait and mobility: Secondary | ICD-10-CM

## 2019-08-27 DIAGNOSIS — M6281 Muscle weakness (generalized): Secondary | ICD-10-CM

## 2019-08-27 DIAGNOSIS — G8929 Other chronic pain: Secondary | ICD-10-CM

## 2019-08-27 DIAGNOSIS — R2681 Unsteadiness on feet: Secondary | ICD-10-CM

## 2019-08-27 NOTE — Therapy (Signed)
[place] MAIN [facility] SERVICES [place], [place], [address] Phone: [phone]   Fax:  [phone]  Occupational Therapy Treatment: Lymphedema Care  Patient Details  Name: Susan Callahan MRN: [id number] Date of Birth: [birth year] Referring Provider (OT): Humberto Leep, MD   Encounter Date: 08/27/2019    Past Medical History:  Diagnosis Date  . COPD (chronic obstructive pulmonary disease) ([place])   . Depression 1974  . Diabetes mellitus without complication ([place])   . Hypertension   . Migraine   . 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 LUMBAR PERITONEAL SHUNT    . TYMPANOSTOMY TUBE PLACEMENT      There were no vitals filed for this visit.       LYMPHEDEMA/ONCOLOGY QUESTIONNAIRE - 08/27/19 1529      Right Upper Extremity Lymphedema   Other  RUE limb volume = 26.9.81 ml    Other  Limb volume is increased by 3.09% since last measured on 04/14/19. overall in affected RUE , on average, by 3.09%.. The volumetric calculation, however, does not capture the distribution or density of the swelling , mainly in the hand and distal FA.   RLE limb volume is decreased  since last measured on 8/3 by              OT Treatments/Exercises (OP) - 08/27/19 0001      ADLs   ADL Education Given  Yes      Manual Therapy   Manual Therapy  Edema management    Manual therapy comments  RUE compararive limb volumetrics    Soft tissue mobilization  MFR to R axilla and medial volar UE - no cord release observ    Myofascial Release  for cord release R axilla    Manual Lymphatic Drainage (MLD)  MLD to RUE/RUQ as established using cntalateral AAA    Compression Bandaging  Pt donns custom compression glove and sleeve with modified independence after session using nylon slippy and friction gloves.             OT Education - 08/27/19 1531    Education Details  Cont Pt edu for LE  self care throughout session. Excellent return.    Person(s) Educated  Patient    Methods  Demonstration;Explanation;Handout    Comprehension  Verbalized understanding;Returned demonstration;Need further instruction          OT Long Term Goals - 08/11/19 1500      OT LONG TERM GOAL #1   Title  Pt independent with modified lymphedema precautions and prevention strategies to limit LE progression.    Baseline  Max A    Time  4    Period  Days    Status  Achieved      OT LONG TERM GOAL #2   Title  Pt will be independent with lymphedema self-care home program and AWS ther ex to improve functional performance of basic and instrumental ADLs by DC.    Baseline  Max A    Time  12    Period  Weeks    Status  Achieved      OT LONG TERM GOAL #3   Title  Pt wil report 0/10 pain with RUE functional use ( reaching, lifting, carrying, dressing, dressing, etc) to achieve independent , pain free ADLs performance.    Baseline  Max A 05/05/19: Reporting 4/10 lymphadema pain now combined with AWS related pain. 05/22/19 AWS  pain resolved. Pt now experiencing pain in hand and distal FA associated with recent onset of lymphedema.    Time  12    Period  Weeks    Status  Achieved      OT LONG TERM GOAL #4   Title  Pt will be able to apply gradient compression wraps using correct techniques independently to control UE swelling and limit LE progression.    Baseline  Max A    Time  1    Period  Weeks    Status  Achieved      OT LONG TERM GOAL #5   Title  After skilled trainng Pt will be able to don and doff  appropriate compression garments and/ or devices using correct techniques and assistive devices with extra time  by end of Intensive Phase CDT for optimal LE management to limit progression over time.    Baseline  Max A    Time  12    Period  Weeks    Status  Achieved      OT LONG TERM GOAL #6   Title  Due to onset of RUE swelling concentrated primarily in R hand during manual therapy for  cording  Pt will achieve 5%, or greater RUE  limb volume reductions during Intensive Phase CDT to  improve ADLs performances, to reduce infection risk, and to limit LE progression.    Baseline  Max A    Time  12    Period  Weeks    Status  Partially Met   hand swelling continues to fluctuate           Plan - 08/27/19 1619    Clinical Impression Statement  Completed RUE comparative limb volumetrics today which reval a 6.59% LIMB VOLUME REDUCTION SINCE LAST MEASURED ON 04/23/19. Overall limb volume reduction  since commencing OT for CDT measures 6.53%.  Sumarrizing all measurements we see a pattern of fluctuation in the RUE with initial volume decreasing slightlythen incrasing by 3/09%, and now decreasing again by 5.39. Fluid in the arm appears to be resolved, but stubbornly persists in the hand. It remains always presnt at the MPs on the dorsal aspect. Cording in the axilla and antecubital fossa are persistent and painful at end ranges of elbow and wriost extension combined with shoulder abduction . Patient remains diligent with all LE and AWS home program protocols, and although cording is indeed less painful, it continues to limit functional  dominant arm use. Pt tolerated MLD and myofacial release in clinic today. She dons custom garments independently after session. We will discuass plan going forward with OT after Pt's consult with [facility] plastic on Friday.    OT Occupational Profile and History  Comprehensive Assessment- Review of records and extensive additional review of physical, cognitive, psychosocial history related to current functional performance    Occupational performance deficits (Please refer to evaluation for details):  ADL's;IADL's;Social Participation;Leisure;Rest and Sleep;Work    Marketing executive / Function / Physical Skills  ADL;Decreased knowledge of precautions;ROM;UE functional use;Decreased knowledge of use of DME;Edema;Skin integrity;Pain;IADL    Rehab Potential  Good     Clinical Decision Making  Several treatment options, min-mod task modification necessary    Comorbidities Affecting Occupational Performance:  Presence of comorbidities impacting occupational performance    Comorbidities impacting occupational performance description:  see SUBJUECTIVE    Modification or Assistance to Complete Evaluation   Min-Moderate modification of tasks or assist with assess necessary to complete eval    OT Frequency  2x / week    OT Duration  12 weeks    OT Treatment/Interventions  Self-care/ADL training;Therapeutic exercise;Manual lymph drainage;Patient/family education;Therapeutic activities;DME and/or AE instruction;Manual Therapy;Other (comment)   consider fitting with prophylactic cmopression arm sleeve   Plan  BUE AROM ther ex to reduce axillary cording- 2 sets of 10 2-3 x daily    Consulted and Agree with Plan of Care  Patient       Patient will benefit from skilled therapeutic intervention in order to improve the following deficits and impairments:   Body Structure / Function / Physical Skills: ADL, Decreased knowledge of precautions, ROM, UE functional use, Decreased knowledge of use of DME, Edema, Skin integrity, Pain, IADL       Visit Diagnosis: Post-mastectomy lymphedema syndrome    Problem List Patient Active Problem List   Diagnosis Date Noted  . Edema 04/16/2019  . Postoperative wound infection 03/13/2019  . Seroma of breast 03/11/2019  . Age-related osteoporosis without current pathological fracture 02/27/2019  . Malignant neoplasm of central portion of right breast in female, estrogen receptor positive ([place]) 02/12/2019  . Genetic testing 01/31/2019  . Malignant neoplasm of upper-inner quadrant of right breast in female, estrogen receptor positive ([place]) 01/15/2019  . Long term current use of opiate analgesic 12/02/2018  . Tympanic membrane central perforation, left 11/04/2018  . DDD (degenerative disc disease), lumbosacral 10/10/2018  .  Abnormal MRI, lumbar spine (08/29/2018) 10/10/2018  . 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 Lumbar nerve root impingement (Bilateral: L4 & L5) (L>R: L5) 10/10/2018  . Lumbar Spinal instability of L4-5 10/10/2018  . Lumbar facet hypertrophy 10/10/2018  . COPD (chronic obstructive pulmonary disease) with emphysema ([place]) 08/28/2018  . Elevated rheumatoid factor 08/13/2018  . Elevated C-reactive protein (CRP) 08/12/2018  . Elevated sed rate 08/12/2018  . Lumbar facet syndrome (Bilateral) 08/12/2018  . Back pain with left-sided radiculopathy 08/12/2018  . DDD (degenerative disc disease), lumbar 08/12/2018  . Chronic lower extremity pain (Primary Area of Pain) (Bilateral) (L>R) 07/23/2018  . Chronic low back pain (Secondary Area of Pain) (Bilateral) (L>R) w/ sciatica (Bilateral) 07/23/2018  . Chronic pain syndrome 07/23/2018  . Pharmacologic therapy 07/23/2018  . Disorder of skeletal system 07/23/2018  . Problems influencing health status 07/23/2018  . Chronic sacroiliac joint pain [facility] Area of Pain) (Bilateral) (L>R) 07/23/2018  . Status post lumboperitoneal shunt placement 02/26/2018  . 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 Tympanic membrane perforation 05/30/2016  . PMB (postmenopausal bleeding) 04/26/2016  . Rash 12/27/2015  . 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 Plantar fasciitis of foot (Left) 05/26/2015  . Spondylosis of lumbar region without myelopathy or radiculopathy 05/26/2015  . Lumbar foraminal stenosis (L3-4, L4-5, L5-S1) (L>R) 05/26/2015  . Spinal stenosis of lumbar region with neurogenic claudication 05/26/2015  . Encounter for screening mammogram for malignant neoplasm of breast 12/02/2014  . Goiter 12/02/2014  . Chronic idiopathic gout of foot (Right) 07/06/2014  . Biceps tendonitis (Right) 05/19/2014  . Shoulder pain (Right) 05/19/2014  .  Facial weakness 05/05/2014  . Post herpetic neuralgia 05/05/2014  . Chronic dysfunction of left eustachian tube 03/24/2014  . Osteopenia 03/13/2014  . Lumbar Grade 1 Anterolisthesis of L3/4 (38m) & L4/5 (4-178m(w/ Dynamic instability) 03/13/2014  . Family history of colon cancer 12/02/2013  . Dyshidrotic eczema 11/17/2013  . Diastasis recti 04/08/2013  . Facial nerve paresis 07/31/2012  . Blepharospasm 05/08/2012  . Conductive hearing loss  of left ear with unrestricted hearing of right ear 04/08/2012  . Obesity 03/28/2012  . Hemoptysis 12/22/2011  . Neuropathic postherpetic trigeminal neuralgia 12/12/2011  . Allergic rhinitis, mild 10/12/2011  . Disequilibrium syndrome 09/27/2011  . Chronic bronchitis ([place]) 06/05/2011  . Essential hypertension 05/24/2011  . Type 2 diabetes mellitus without complications ([place]) 22/63/3354  . Pulmonary nodules 05/05/2011    Andrey Spearman, MS, OTR/L, [facility] 08/27/19 4:28 PM  [place] MAIN [facility] SERVICES [address] [place], [place], [address] Phone: [phone]   Fax:  [phone]  Name: Lynsey Ange MRN: [id number] Date of Birth: 07/12/1951

## 2019-08-27 NOTE — Therapy (Signed)
[facility] [place] [facility] SERVICES [address] [place], [place], [address] Phone: [phone]   Fax:  [phone]  Physical Therapy Treatment  Patient Details  Name: Susan Callahan MRN: [id number] Date of Birth: Oct 18, 1950 No data recorded  Encounter Date: 08/27/2019  PT End of Session - 08/27/19 1446    Visit Number  71    Number of Visits  72    Date for PT Re-Evaluation  09/01/19    Authorization Type  1/10 PN 08/13/19    PT Start Time  1516    PT Stop Time  1601    PT Time Calculation (min)  45 min    Equipment Utilized During Treatment  Gait belt    Activity Tolerance  Patient tolerated treatment well    Behavior During Therapy  [facility] for tasks assessed/performed       Past Medical History:  Diagnosis Date  . COPD (chronic obstructive pulmonary disease) ([place])   . Depression 1974  . Diabetes mellitus without complication ([place])   . Hypertension   . Migraine   . 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 LUMBAR PERITONEAL SHUNT    . TYMPANOSTOMY TUBE PLACEMENT      There were no vitals filed for this visit.  Subjective Assessment - 08/27/19 1606    Subjective  Patient reports compliance with HEP with no falls and no pain. She has been exercising with her cousin as well and has noted her balance is drastically improving.    Pertinent History  Patient reports balance issues began in 2008. 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  Balance deficit has been constant since 2008, some days are worse than others but is always present to patient. Does not notice a directionality of loss of balance. Patient reports her balance is affecting her standing, walking on uneven surfaces such as outside, trying to walk with dim light, showering and ADLs. Slight visual deficit of R lower quadrant. 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 Has nystagmus when looking to the R.   Back pain began in 2007 and has gradually worsened to nonstop pain. Medication helps briefly. Patient is challenged with standing with knees extended due to back pain.    Limitations  Lifting;Standing;Walking;House hold activities    How long can you stand comfortably?  pain begins immediately     How long can you walk comfortably?  pain begins immediately     Patient Stated Goals  walk better, have less pain, be able to walk in the grocery store with head turns to scan shelves    Currently in Pain?  No/denies         [facility] PT Assessment - 08/27/19 0001      Dynamic Gait Index   Level Surface  Normal    Change in Gait Speed  Normal    Gait with Horizontal Head Turns  Normal    Gait with Vertical Head Turns  Normal    Gait and Pivot Turn  Normal    Step Over Obstacle  Normal    Step Around Obstacles  Normal    Steps  Mild Impairment    Total Score  23          Prone: Roller to piriformis, hamstrings, and calves x 5  minutes   Standing: Opposite arm/leg raises for coordination and single leg stability 12x each LE Heel toe raises with  UE support 12x Lateral modified lunge with UE support on back of chair 10x each side    Ambulate in hallway with ball raises and head turns to follow bal 160 ft with CGA   Goals:  VAS: 2/10 Strength L 4+/5 R 5/5 6 minute walk 1000 ft in 5 minutes, terminated after 1001 ft due to breathing/fatigue.   DGI: 23/24  Patient has met all goals at this time. Due to current history of cancer/unstable condition patient will benefit from a trial period of home exercise program only. If patient tolerates HEP only till end of month she will be discharged. Patient agreeable to this plan. I will be happy to see patient again in the future as needed.                 PT Education - 08/27/19 1446    Education Details  exercise technique, manual, body mechanics    Person(s) Educated  Patient    Methods  Explanation;Demonstration;Tactile  cues;Verbal cues    Comprehension  Verbalized understanding;Returned demonstration;Verbal cues required;Tactile cues required       PT Short Term Goals - 08/27/19 1604      PT SHORT TERM GOAL #1   Title  Patient will be independent with completion of HEP to improve ability to complete functional tasks and functional ADLs.    Baseline  10/23: will give HEP next session 11/19: HEP compliance    Time  2    Period  Weeks    Status  Achieved      PT SHORT TERM GOAL #2   Title  Patient will report pain score less than 4/10 on VAS to demonstrate reduction of pain levels with functional tasks.     Baseline  10/23: 6/10 11/19: 5/10 12/12: 9/10 1/10: 7/10  2/11: 3/10     Time  2    Period  Weeks    Status  Achieved        PT Long Term Goals - 08/27/19 1604      PT LONG TERM GOAL #1   Title  Patient will report pain score of <3/10 on VAS for decreased pain levels and ease with functional activities.    Baseline  10/23: 6/10 11/19: 5/10 12/12 9/10 1/10: 7/10 2/11: 3/10 3/9: 6/10  5/26: 8/10 03/17/19: 7/10 8/3: 5/10 8/25: 4/10 9/21: 4/10 10/26: 4/10 11/23: 3/10 12/16: 2/10    Time  4    Period  Weeks    Status  Achieved      PT LONG TERM GOAL #2   Title  Patient will be able to ambulate on treadmill for 51mnutes without increase in back/leg pain demonstrating improved community ambulation and treadmill walking at pulmonary rehab    Baseline  10/23: 062mutes increases pain 11/19: 39m22mtes 12/12: has not had the chance to perform 1/10: was able to do 5 minutes without pain 2/11: able to do 5 minutes then had to bend over 3/9: 5 minutes 30 seconds at 2.0 mphs 5/26: had to walk 10 minutes at cancer center with extreme pain but was able to perform 7/6: walks 10 minutes with increase of pain by 2 points 8/3: able to perform with bent over posture 9/21: able to perform 7 minutes with intervals of 1-2% incline    Time  4    Period  Weeks    Status  Achieved      PT LONG TERM GOAL #3   Title   Patient will increase dynamic gait index  score to >19/24 as to demonstrate reduced fall risk and improved dynamic gait balance for better safety with community/home ambulation.    Baseline  10/1: 11/24 10/26: 18/24 11/23: 21/24    Time  4    Period  Weeks    Status  Achieved      PT LONG TERM GOAL #4   Title  Patient will score 4+/5 on BLE MMT to demonstrate improved glute strength and ability to carry groceries with increased ease up/down stairs at home.    Baseline  11/23: grossly 4+/5 with hip extension and adduction 4-/5 L df 4-/5 10/23: 4-/5 grossly 11/19: 4/5 grossly; hamstrings 4-/5 12/12: 4/5 gross 1/10: 4/5 gross 2/11: gross 4+/5 with hamstrings and abuductors 4-/5 3/9: gross 4+/5 hamstrings and abductors 4-/5 5/27: grossly 3+/5 with pain with LLE movements 7/6: grossly 4-/5 with pain with LLE movement 8/3: deferred due to swelling/pain 8/25: grossly 4/5 without pain 9/21: grossly 4/5 with hamstrings not tested due to spasm 12/16: L 4+/5 R 5/5    Time  4    Period  Weeks    Status  Achieved      PT LONG TERM GOAL #5   Title  Patient will increase six minute walk test distance to >1000 with minimal increase of pain for progression to community ambulator and improve gait ability    Baseline  10/1:580 ft stopped at 3: 58 due to excessive pain 10/26: 950 ft 11/23: 982 ft 12/16: >1000 ft, walked 1000 ft in 5 minutes    Time  4    Period  Weeks    Status  Achieved      PT LONG TERM GOAL #6   Title  Patient will be able to make and/or strip the bed without pain and without needing rest breaks to perform iADLs independently.     Baseline  2/11: pain limits ability to perform, ~4 breaks required 3/9: takes her time, 3 breaks 5/27: very painful still 7/6: requires only one rest break 8/3: able to perform with +2 pain increase 8/25: no rest breaks required, increased pain 4 points 9/21: able to perform with +1-2 symptoms 10/26: able to perform with only occasional symptoms.    Time  4    Period   Weeks    Status  Achieved      PT LONG TERM GOAL #7   Title  Patient will have decreased modified oswestry score <20% for improved ability to complete household tasks with less pain levels.     Baseline  2/11: 30%  3/9: 30% 5/27: 63% 7/6: 38% 8/3: 42% 8/25: 36% 9/21: 30% 10/26: 26% 11/23: 16%    Time  4    Period  Weeks    Status  Achieved      PT LONG TERM GOAL #8   Title  Patient will increase Berg Balance score by > 6 points (50/56) to demonstrate decreased fall risk during functional activities.    Baseline  10/1: 44/56 10/26: 49/56 11/23: 54/56    Time  4    Period  Weeks    Status  Achieved      PT LONG TERM GOAL  #9   TITLE  Patient will increase dynamic gait index score to >23/24 as to demonstrate reduced fall risk and improved dynamic gait balance for better safety with community/home ambulation.    Baseline  11/23: 21/ 24 12/16: 23/24    Time  4    Period  Weeks    Status  Achieved            Plan - 08/27/19 1607    Clinical Impression Statement  Patient has met all goals at this time. Due to current history of cancer/unstable condition patient will benefit from a trial period of home exercise program only. If patient tolerates HEP only till end of month she will be discharged. Patient agreeable to this plan. I will be happy to see patient again in the future as needed.    Personal Factors and Comorbidities  Age;Comorbidity 3+;Fitness;Finances;Past/Current Experience;Social Background;Time since onset of injury/illness/exacerbation    Comorbidities  COPD, Ramsay Hunt Syndrome, HTN, DM, lymphedema, breast cancer    Examination-Activity Limitations  Bathing;Bend;Carry;Dressing;Hygiene/Grooming;Stairs;Squat;Reach Overhead;Locomotion Level;Lift;Stand;Transfers    Examination-Participation Restrictions  Cleaning;Community Activity;Laundry;Volunteer;Shop;Personal Finances;Yard Work    [employer]  Evolving/Moderate complexity    Rehab Potential  Fair     PT Frequency  1x / week    PT Duration  4 weeks    PT Treatment/Interventions  ADLs/Self Care Home Management;Cryotherapy;Moist Heat;Traction;Iontophoresis 34m/ml Dexamethasone;Ultrasound;Gait training;Stair training;Functional mobility training;Neuromuscular re-education;Balance training;Therapeutic exercise;Therapeutic activities;Patient/family education;Manual techniques;Energy conservation;Aquatic Therapy;Electrical Stimulation;Passive range of motion;Dry needling;Taping;Vestibular;Canalith Repostioning    Consulted and Agree with Plan of Care  Patient       Patient will benefit from skilled therapeutic intervention in order to improve the following deficits and impairments:  Abnormal gait, Cardiopulmonary status limiting activity, Decreased activity tolerance, Decreased balance, Decreased endurance, Decreased range of motion, Difficulty walking, Decreased strength, Hypomobility, Decreased mobility, Impaired perceived functional ability, Impaired flexibility, Postural dysfunction, Obesity, Improper body mechanics, Pain, Dizziness, Increased edema  Visit Diagnosis: Chronic low back pain, unspecified back pain laterality, unspecified whether sciatica present  Muscle weakness (generalized)  Other abnormalities of gait and mobility  Unsteadiness on feet     Problem List Patient Active Problem List   Diagnosis Date Noted  . Edema 04/16/2019  . Postoperative wound infection 03/13/2019  . Seroma of breast 03/11/2019  . Age-related osteoporosis without current pathological fracture 02/27/2019  . Malignant neoplasm of central portion of right breast in female, estrogen receptor positive ([place] 02/12/2019  . Genetic testing 01/31/2019  . Malignant neoplasm of upper-inner quadrant of right breast in female, estrogen receptor positive ([place] 01/15/2019  . Long term current use of opiate analgesic 12/02/2018  . Tympanic membrane central perforation, left 11/04/2018  . DDD (degenerative disc  disease), lumbosacral 10/10/2018  . Abnormal MRI, lumbar spine (08/29/2018) 10/10/2018  . 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 Lumbar nerve root impingement (Bilateral: L4 & L5) (L>R: L5) 10/10/2018  . Lumbar Spinal instability of L4-5 10/10/2018  . Lumbar facet hypertrophy 10/10/2018  . COPD (chronic obstructive pulmonary disease) with emphysema ([place] 08/28/2018  . Elevated rheumatoid factor 08/13/2018  . Elevated C-reactive protein (CRP) 08/12/2018  . Elevated sed rate 08/12/2018  . Lumbar facet syndrome (Bilateral) 08/12/2018  . Back pain with left-sided radiculopathy 08/12/2018  . DDD (degenerative disc disease), lumbar 08/12/2018  . Chronic lower extremity pain (Primary Area of Pain) (Bilateral) (L>R) 07/23/2018  . Chronic low back pain (Secondary Area of Pain) (Bilateral) (L>R) w/ sciatica (Bilateral) 07/23/2018  . Chronic pain syndrome 07/23/2018  . Pharmacologic therapy 07/23/2018  . Disorder of skeletal system 07/23/2018  . Problems influencing health status 07/23/2018  . Chronic sacroiliac joint pain ([facility]) (Bilateral) (L>R) 07/23/2018  . Status post lumboperitoneal shunt placement 02/26/2018  . 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 Tympanic membrane perforation 05/30/2016  .  PMB (postmenopausal bleeding) 04/26/2016  . Rash 12/27/2015  . 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 Plantar fasciitis of foot (Left) 05/26/2015  . Spondylosis of lumbar region without myelopathy or radiculopathy 05/26/2015  . Lumbar foraminal stenosis (L3-4, L4-5, L5-S1) (L>R) 05/26/2015  . Spinal stenosis of lumbar region with neurogenic claudication 05/26/2015  . Encounter for screening mammogram for malignant neoplasm of breast 12/02/2014  . Goiter 12/02/2014  . Chronic idiopathic gout of foot (Right) 07/06/2014  . Biceps tendonitis (Right) 05/19/2014  .  Shoulder pain (Right) 05/19/2014  . Facial weakness 05/05/2014  . Post herpetic neuralgia 05/05/2014  . Chronic dysfunction of left eustachian tube 03/24/2014  . Osteopenia 03/13/2014  . Lumbar Grade 1 Anterolisthesis of L3/4 (54m) & L4/5 (4-171m(w/ Dynamic instability) 03/13/2014  . Family history of colon cancer 12/02/2013  . Dyshidrotic eczema 11/17/2013  . Diastasis recti 04/08/2013  . Facial nerve paresis 07/31/2012  . Blepharospasm 05/08/2012  . 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 Obesity 03/28/2012  . Hemoptysis 12/22/2011  . Neuropathic postherpetic trigeminal neuralgia 12/12/2011  . Allergic rhinitis, mild 10/12/2011  . Disequilibrium syndrome 09/27/2011  . Chronic bronchitis ([place]09/24/2012  . Essential hypertension 05/24/2011  . Type 2 diabetes mellitus without complications ([id number]. Pulmonary nodules 05/05/2011   MaJanna ArchPT, DPT   08/27/2019, 4:10 PM  [facility]arding[facility] REJe[facility] HospitalERV[facility] [id number]: [phone] Fax:  [phone]: LiJasmin WinberryRN: [id number] of Birth: [birth year]

## 2019-08-27 NOTE — Patient Instructions (Signed)
Lymphedema Precautions   Dopler study required prior to participating in Complete Decongestive Therapy (CDT) for lymphedema (LE) care  to rule out DVT   If you experience atypical shortness of breath, or notice any signs /symptoms of skin infection (aka cellulitis) remove all compression wraps/ garments, discontinue manual lymphatic drainage (MLD), and report symptoms to your physician immediately. Discontinue MLD and compression for 72 hours after you take your first oral antibiotic so not to spread the infection.   Lymphedema Self- Care Instructions  1. EXERCISE: Perform lymphatic pumping there ex 2 x a day. While wearing your compression wraps or garments. Perform 10 reps of each exercise bilaterally and be sure to perform them in order. Don;t skip around!  OMIT PARTIAL SIT UP  2. MLD: Perform simple self-Manual Lymphatic Drainage (MLD) at least once a day as directed.  3. WRAPS: Compression wraps are to be worn 23 hrs/ 7 days/wk during Intensive Phase of Complete Decongestive Therapy (CDT).Building tolerance may take time and practice, so don't get discouraged. If bandages begin to feel tight during periods of inactivity and/or during the night, try performing your exercises to loosen them.   4. GARMENTS: During Management Phase CDT your compression garments are to be worn during waking hours when active. Do NOT sleep in your garments!!   5. PUT YOUR FEET UP! Elevate your feet and legs and feet to the level of your heart whenever you are sitting down.   6. SKIN: Carefully monitor skin condition and perform impeccable hygiene daily. Bathe skin with mild soap and water and apply low pH lotion (aka Eucerin ) to improve hydration and limit infection risk.    Lymphatic Pumping Exercises:

## 2019-09-01 ENCOUNTER — Ambulatory Visit: Payer: Medicare Other

## 2019-09-01 ENCOUNTER — Ambulatory Visit: Payer: Medicare Other | Admitting: Occupational Therapy

## 2019-09-03 ENCOUNTER — Ambulatory Visit: Payer: Medicare Other | Admitting: Occupational Therapy

## 2019-09-03 ENCOUNTER — Ambulatory Visit: Payer: Medicare Other

## 2019-09-03 ENCOUNTER — Other Ambulatory Visit: Payer: Self-pay

## 2019-09-03 DIAGNOSIS — I972 Postmastectomy lymphedema syndrome: Secondary | ICD-10-CM

## 2019-09-03 NOTE — Therapy (Signed)
[place] MAIN [facility] SERVICES [address] [place], [place], [address] Phone: [phone]   Fax:  [phone]  Occupational Therapy Treatment  Patient Details  Name: Susan Callahan MRN: [id number] Date of Birth: 04-28-1951 Referring Provider (OT): Humberto Leep, MD   Encounter Date: 09/03/2019  OT End of Session - 09/03/19 1517    Visit Number  56    Number of Visits  72    Date for OT Re-Evaluation  09/18/19    OT Start Time  0210    OT Stop Time  0310    OT Time Calculation (min)  60 min    Activity Tolerance  Patient tolerated treatment well;No increased pain    Behavior During Therapy  WFL for tasks assessed/performed       Past Medical History:  Diagnosis Date  . COPD (chronic obstructive pulmonary disease) ([place])   . Depression 1974  . Diabetes mellitus without complication ([place])   . Hypertension   . Migraine   . 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 LUMBAR PERITONEAL SHUNT    . TYMPANOSTOMY TUBE PLACEMENT      There were no vitals filed for this visit.  Subjective Assessment - 09/03/19 1507    Subjective   Pt presents for OT visit 55/72 (12) to address RUE/ RUQ post-mastectomy lymphedema (hand) and ongoing AWS. Pt rates L axilla pain at 3/10 both at rest and with activity. "It feels like something is pulling, pinching me. "                   OT Treatments/Exercises (OP) - 09/03/19 0001      ADLs   ADL Education Given  Yes      Manual Therapy   Manual Therapy  Edema management    Soft tissue mobilization  MFR to R axilla and medial volar UE - no cord release observ    Myofascial Release  for cord release R axilla    Manual Lymphatic Drainage (MLD)  MLD to RUE/RUQ as established using cntalateral AAA    Compression Bandaging  Pt donns custom compression glove and sleeve with modified independence after session using nylon slippy and friction gloves.              OT Education - 09/03/19 1516    Education Details  Cont Pt edu for LE self care throughout session. Excellent return.    Person(s) Educated  Patient    Methods  Demonstration;Explanation;Handout    Comprehension  Verbalized understanding;Returned demonstration;Need further instruction          OT Long Term Goals - 08/11/19 1500      OT LONG TERM GOAL #1   Title  Pt independent with modified lymphedema precautions and prevention strategies to limit LE progression.    Baseline  Max A    Time  4    Period  Days    Status  Achieved      OT LONG TERM GOAL #2   Title  Pt will be independent with lymphedema self-care home program and AWS ther ex to improve functional performance of basic and instrumental ADLs by DC.    Baseline  Max A    Time  12    Period  Weeks    Status  Achieved      OT LONG TERM GOAL #3   Title  Pt wil report 0/10 pain with RUE functional  use ( reaching, lifting, carrying, dressing, dressing, etc) to achieve independent , pain free ADLs performance.    Baseline  Max A 05/05/19: Reporting 4/10 lymphadema pain now combined with AWS related pain. 05/22/19 AWS pain resolved. Pt now experiencing pain in hand and distal FA associated with recent onset of lymphedema.    Time  12    Period  Weeks    Status  Achieved      OT LONG TERM GOAL #4   Title  Pt will be able to apply gradient compression wraps using correct techniques independently to control UE swelling and limit LE progression.    Baseline  Max A    Time  1    Period  Weeks    Status  Achieved      OT LONG TERM GOAL #5   Title  After skilled trainng Pt will be able to don and doff  appropriate compression garments and/ or devices using correct techniques and assistive devices with extra time  by end of Intensive Phase CDT for optimal LE management to limit progression over time.    Baseline  Max A    Time  12    Period  Weeks    Status  Achieved      OT LONG TERM GOAL #6   Title   Due to onset of RUE swelling concentrated primarily in R hand during manual therapy for cording  Pt will achieve 5%, or greater RUE  limb volume reductions during Intensive Phase CDT to  improve ADLs performances, to reduce infection risk, and to limit LE progression.    Baseline  Max A    Time  12    Period  Weeks    Status  Partially Met   hand swelling continues to fluctuate           Plan - 09/03/19 1517    Clinical Impression Statement  Pt requested palpation of deep L axilla due to pinpoint soreness. No adenopathy or other abnormaliity palpated. Pt encouraged to report to her  physician. Provided MLD and MFR without increased pain / discomfort. Cords in R axilla, antecubital fossa and medial fa persist and are easily palpable and visible. They continue to cause pain  at end ranges. Cont 2 x weekly CDT through Jan 21, then decrease to 1 x weekly in Feb 2021. Cont  assitintg with obtaining donated Flexiouch for long term LE self- management over time.    OT Occupational Profile and History  Comprehensive Assessment- Review of records and extensive additional review of physical, cognitive, psychosocial history related to current functional performance    Occupational performance deficits (Please refer to evaluation for details):  ADL's;IADL's;Social Participation;Leisure;Rest and Sleep;Work    Marketing executive / Function / Physical Skills  ADL;Decreased knowledge of precautions;ROM;UE functional use;Decreased knowledge of use of DME;Edema;Skin integrity;Pain;IADL    Rehab Potential  Good    Clinical Decision Making  Several treatment options, min-mod task modification necessary    Comorbidities Affecting Occupational Performance:  Presence of comorbidities impacting occupational performance    Comorbidities impacting occupational performance description:  see SUBJUECTIVE    Modification or Assistance to Complete Evaluation   Min-Moderate modification of tasks or assist with assess necessary  to complete eval    OT Frequency  2x / week    OT Duration  12 weeks    OT Treatment/Interventions  Self-care/ADL training;Therapeutic exercise;Manual lymph drainage;Patient/family education;Therapeutic activities;DME and/or AE instruction;Manual Therapy;Other (comment)   consider fitting with prophylactic cmopression arm  sleeve   Plan  BUE AROM ther ex to reduce axillary cording- 2 sets of 10 2-3 x daily    Consulted and Agree with Plan of Care  Patient       Patient will benefit from skilled therapeutic intervention in order to improve the following deficits and impairments:   Body Structure / Function / Physical Skills: ADL, Decreased knowledge of precautions, ROM, UE functional use, Decreased knowledge of use of DME, Edema, Skin integrity, Pain, IADL       Visit Diagnosis: Postmastectomy lymphedema syndrome    Problem List Patient Active Problem List   Diagnosis Date Noted  . Edema 04/16/2019  . Postoperative wound infection 03/13/2019  . Seroma of breast 03/11/2019  . Age-related osteoporosis without current pathological fracture 02/27/2019  . Malignant neoplasm of central portion of right breast in female, estrogen receptor positive ([place]) 02/12/2019  . Genetic testing 01/31/2019  . Malignant neoplasm of upper-inner quadrant of right breast in female, estrogen receptor positive ([place]) 01/15/2019  . Long term current use of opiate analgesic 12/02/2018  . Tympanic membrane central perforation, left 11/04/2018  . DDD (degenerative disc disease), lumbosacral 10/10/2018  . Abnormal MRI, lumbar spine (08/29/2018) 10/10/2018  . 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 Lumbar nerve root impingement (Bilateral: L4 & L5) (L>R: L5) 10/10/2018  . Lumbar Spinal instability of L4-5 10/10/2018  . Lumbar facet hypertrophy 10/10/2018  . COPD (chronic obstructive pulmonary disease) with emphysema ([place]) 08/28/2018  . Elevated rheumatoid factor  08/13/2018  . Elevated C-reactive protein (CRP) 08/12/2018  . Elevated sed rate 08/12/2018  . Lumbar facet syndrome (Bilateral) 08/12/2018  . Back pain with left-sided radiculopathy 08/12/2018  . DDD (degenerative disc disease), lumbar 08/12/2018  . Chronic lower extremity pain (Primary Area of Pain) (Bilateral) (L>R) 07/23/2018  . Chronic low back pain (Secondary Area of Pain) (Bilateral) (L>R) w/ sciatica (Bilateral) 07/23/2018  . Chronic pain syndrome 07/23/2018  . Pharmacologic therapy 07/23/2018  . Disorder of skeletal system 07/23/2018  . Problems influencing health status 07/23/2018  . Chronic sacroiliac joint pain [facility] Area of Pain) (Bilateral) (L>R) 07/23/2018  . Status post lumboperitoneal shunt placement 02/26/2018  . 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 Tympanic membrane perforation 05/30/2016  . PMB (postmenopausal bleeding) 04/26/2016  . Rash 12/27/2015  . 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 Plantar fasciitis of foot (Left) 05/26/2015  . Spondylosis of lumbar region without myelopathy or radiculopathy 05/26/2015  . Lumbar foraminal stenosis (L3-4, L4-5, L5-S1) (L>R) 05/26/2015  . Spinal stenosis of lumbar region with neurogenic claudication 05/26/2015  . Encounter for screening mammogram for malignant neoplasm of breast 12/02/2014  . Goiter 12/02/2014  . Chronic idiopathic gout of foot (Right) 07/06/2014  . Biceps tendonitis (Right) 05/19/2014  . Shoulder pain (Right) 05/19/2014  . Facial weakness 05/05/2014  . Post herpetic neuralgia 05/05/2014  . Chronic dysfunction of left eustachian tube 03/24/2014  . Osteopenia 03/13/2014  . Lumbar Grade 1 Anterolisthesis of L3/4 (30m) & L4/5 (4-153m(w/ Dynamic instability) 03/13/2014  . Family history of colon cancer 12/02/2013  . Dyshidrotic eczema 11/17/2013  . Diastasis recti 04/08/2013  . Facial nerve paresis 07/31/2012  . Blepharospasm  05/08/2012  . 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 Obesity 03/28/2012  . Hemoptysis 12/22/2011  . Neuropathic postherpetic trigeminal neuralgia 12/12/2011  . Allergic rhinitis, mild 10/12/2011  . Disequilibrium syndrome 09/27/2011  .  Chronic bronchitis ([place]) 06/05/2011  . Essential hypertension 05/24/2011  . Type 2 diabetes mellitus without complications ([place]) 76/19/1550  . Pulmonary nodules 05/05/2011    Andrey Spearman, MS, OTR/L, [facility] 09/03/19 3:31 PM  [place] MAIN [facility] SERVICES [address] [place], [place], [address] Phone: [phone]   Fax:  [phone]  Name: Lynley Killilea MRN: [id number] Date of Birth: 1951/06/19

## 2019-09-16 ENCOUNTER — Ambulatory Visit: Payer: Medicare Other | Admitting: Occupational Therapy

## 2019-09-18 ENCOUNTER — Other Ambulatory Visit: Payer: Self-pay

## 2019-09-18 ENCOUNTER — Ambulatory Visit: Payer: Medicare Other | Attending: Rehabilitation | Admitting: Occupational Therapy

## 2019-09-18 DIAGNOSIS — I972 Postmastectomy lymphedema syndrome: Secondary | ICD-10-CM | POA: Diagnosis present

## 2019-09-19 NOTE — Therapy (Signed)
[place] MAIN [facility] SERVICES [address] [place], [place], [address] Phone: [phone]   Fax:  [phone]  Occupational Therapy Treatment  Patient Details  Name: Susan Callahan MRN: [id number] Date of Birth: April 18, 1951 Referring Provider (OT): Humberto Leep, MD   Encounter Date: 09/18/2019  OT End of Session - 09/18/19 1012    Visit Number  41    Number of Visits  72    Date for OT Re-Evaluation  12/18/19    Authorization Time Period  2021 visit 1/ 36    OT Start Time  0202    OT Stop Time  0305    OT Time Calculation (min)  63 min    Activity Tolerance  Patient tolerated treatment well;No increased pain    Behavior During Therapy  WFL for tasks assessed/performed       Past Medical History:  Diagnosis Date  . COPD (chronic obstructive pulmonary disease) ([place])   . Depression 1974  . Diabetes mellitus without complication ([place])   . Hypertension   . Migraine   . 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 LUMBAR PERITONEAL SHUNT    . TYMPANOSTOMY TUBE PLACEMENT      There were no vitals filed for this visit.  Subjective Assessment - 09/19/19 1010    Subjective   Pt presents for OT visit 56/72 (36) to address RUE/ RUQ post-mastectomy lymphedema (hand) and ongoing AWS. Pt rates rates L axilla and arm pain at 2/10.                   OT Treatments/Exercises (OP) - 09/19/19 0001      ADLs   ADL Education Given  Yes      Manual Therapy   Manual Therapy  Edema management    Soft tissue mobilization  MFR to R axilla and medial volar UE - no cord release observ    Myofascial Release  for cord release R axilla    Manual Lymphatic Drainage (MLD)  MLD to RUE/RUQ as established using cntalateral AAA    Compression Bandaging  Pt donns custom compression glove and sleeve with modified independence after session using nylon slippy and friction gloves.             OT  Education - 09/19/19 1011    Education Details  Cont Pt edu for LE self care throughout session. Excellent return.    Person(s) Educated  Patient    Methods  Demonstration;Explanation;Handout    Comprehension  Verbalized understanding;Returned demonstration;Need further instruction          OT Long Term Goals - 08/11/19 1500      OT LONG TERM GOAL #1   Title  Pt independent with modified lymphedema precautions and prevention strategies to limit LE progression.    Baseline  Max A    Time  4    Period  Days    Status  Achieved      OT LONG TERM GOAL #2   Title  Pt will be independent with lymphedema self-care home program and AWS ther ex to improve functional performance of basic and instrumental ADLs by DC.    Baseline  Max A    Time  12    Period  Weeks    Status  Achieved      OT LONG TERM GOAL #3   Title  Pt wil report 0/10 pain with RUE functional use (  reaching, lifting, carrying, dressing, dressing, etc) to achieve independent , pain free ADLs performance.    Baseline  Max A 05/05/19: Reporting 4/10 lymphadema pain now combined with AWS related pain. 05/22/19 AWS pain resolved. Pt now experiencing pain in hand and distal FA associated with recent onset of lymphedema.    Time  12    Period  Weeks    Status  Achieved      OT LONG TERM GOAL #4   Title  Pt will be able to apply gradient compression wraps using correct techniques independently to control UE swelling and limit LE progression.    Baseline  Max A    Time  1    Period  Weeks    Status  Achieved      OT LONG TERM GOAL #5   Title  After skilled trainng Pt will be able to don and doff  appropriate compression garments and/ or devices using correct techniques and assistive devices with extra time  by end of Intensive Phase CDT for optimal LE management to limit progression over time.    Baseline  Max A    Time  12    Period  Weeks    Status  Achieved      OT LONG TERM GOAL #6   Title  Due to onset of RUE  swelling concentrated primarily in R hand during manual therapy for cording  Pt will achieve 5%, or greater RUE  limb volume reductions during Intensive Phase CDT to  improve ADLs performances, to reduce infection risk, and to limit LE progression.    Baseline  Max A    Time  12    Period  Weeks    Status  Partially Met   hand swelling continues to fluctuate           Plan - 09/18/19 1014    Clinical Impression Statement  Provided MLD with emphasis on the hand and wrist utilizing ipsillateral anterior axillary anastamosis. No adenopathy palpated, but painful axillary cording extending across L antecubital fossa persist.  Pt encouraged to report to her breast surgeon at upcoming appointment. Marland Kitchen MLD alternated with gentle myofacial release techniques starting proximal and working diwstally. Good tolerance for manual therapy today without increased pain. Cont 2 x weekly CDT through Jan 21, then decrease to 1 x weekly in Feb 2021. Cont  assitintg with obtaining donated Flexiouch for long term LE self- management over time.    OT Occupational Profile and History  Comprehensive Assessment- Review of records and extensive additional review of physical, cognitive, psychosocial history related to current functional performance    Occupational performance deficits (Please refer to evaluation for details):  ADL's;IADL's;Social Participation;Leisure;Rest and Sleep;Work    Marketing executive / Function / Physical Skills  ADL;Decreased knowledge of precautions;ROM;UE functional use;Decreased knowledge of use of DME;Edema;Skin integrity;Pain;IADL    Rehab Potential  Good    Clinical Decision Making  Several treatment options, min-mod task modification necessary    Comorbidities Affecting Occupational Performance:  Presence of comorbidities impacting occupational performance    Comorbidities impacting occupational performance description:  see SUBJUECTIVE    Modification or Assistance to Complete Evaluation    Min-Moderate modification of tasks or assist with assess necessary to complete eval    OT Frequency  2x / week    OT Duration  12 weeks    OT Treatment/Interventions  Self-care/ADL training;Therapeutic exercise;Manual lymph drainage;Patient/family education;Therapeutic activities;DME and/or AE instruction;Manual Therapy;Other (comment)   consider fitting with prophylactic cmopression arm  sleeve   Plan  BUE AROM ther ex to reduce axillary cording- 2 sets of 10 2-3 x daily    Consulted and Agree with Plan of Care  Patient       Patient will benefit from skilled therapeutic intervention in order to improve the following deficits and impairments:   Body Structure / Function / Physical Skills: ADL, Decreased knowledge of precautions, ROM, UE functional use, Decreased knowledge of use of DME, Edema, Skin integrity, Pain, IADL       Visit Diagnosis: Postmastectomy lymphedema syndrome - Plan: Ot plan of care cert/re-cert    Problem List Patient Active Problem List   Diagnosis Date Noted  . Edema 04/16/2019  . Postoperative wound infection 03/13/2019  . Seroma of breast 03/11/2019  . Age-related osteoporosis without current pathological fracture 02/27/2019  . Malignant neoplasm of central portion of right breast in female, estrogen receptor positive ([place]) 02/12/2019  . Genetic testing 01/31/2019  . Malignant neoplasm of upper-inner quadrant of right breast in female, estrogen receptor positive ([place]) 01/15/2019  . Long term current use of opiate analgesic 12/02/2018  . Tympanic membrane central perforation, left 11/04/2018  . DDD (degenerative disc disease), lumbosacral 10/10/2018  . Abnormal MRI, lumbar spine (08/29/2018) 10/10/2018  . 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 Lumbar nerve root impingement (Bilateral: L4 & L5) (L>R: L5) 10/10/2018  . Lumbar Spinal instability of L4-5 10/10/2018  . Lumbar facet hypertrophy 10/10/2018  . COPD  (chronic obstructive pulmonary disease) with emphysema ([place]) 08/28/2018  . Elevated rheumatoid factor 08/13/2018  . Elevated C-reactive protein (CRP) 08/12/2018  . Elevated sed rate 08/12/2018  . Lumbar facet syndrome (Bilateral) 08/12/2018  . Back pain with left-sided radiculopathy 08/12/2018  . DDD (degenerative disc disease), lumbar 08/12/2018  . Chronic lower extremity pain (Primary Area of Pain) (Bilateral) (L>R) 07/23/2018  . Chronic low back pain (Secondary Area of Pain) (Bilateral) (L>R) w/ sciatica (Bilateral) 07/23/2018  . Chronic pain syndrome 07/23/2018  . Pharmacologic therapy 07/23/2018  . Disorder of skeletal system 07/23/2018  . Problems influencing health status 07/23/2018  . Chronic sacroiliac joint pain [facility] Area of Pain) (Bilateral) (L>R) 07/23/2018  . Status post lumboperitoneal shunt placement 02/26/2018  . 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 Tympanic membrane perforation 05/30/2016  . PMB (postmenopausal bleeding) 04/26/2016  . Rash 12/27/2015  . 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 Plantar fasciitis of foot (Left) 05/26/2015  . Spondylosis of lumbar region without myelopathy or radiculopathy 05/26/2015  . Lumbar foraminal stenosis (L3-4, L4-5, L5-S1) (L>R) 05/26/2015  . Spinal stenosis of lumbar region with neurogenic claudication 05/26/2015  . Encounter for screening mammogram for malignant neoplasm of breast 12/02/2014  . Goiter 12/02/2014  . Chronic idiopathic gout of foot (Right) 07/06/2014  . Biceps tendonitis (Right) 05/19/2014  . Shoulder pain (Right) 05/19/2014  . Facial weakness 05/05/2014  . Post herpetic neuralgia 05/05/2014  . Chronic dysfunction of left eustachian tube 03/24/2014  . Osteopenia 03/13/2014  . Lumbar Grade 1 Anterolisthesis of L3/4 (32m) & L4/5 (4-128m(w/ Dynamic instability) 03/13/2014  . Family history of colon cancer 12/02/2013  . Dyshidrotic  eczema 11/17/2013  . Diastasis recti 04/08/2013  . Facial nerve paresis 07/31/2012  . Blepharospasm 05/08/2012  . 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 Obesity 03/28/2012  . Hemoptysis 12/22/2011  . Neuropathic postherpetic trigeminal neuralgia 12/12/2011  . Allergic rhinitis, mild  10/12/2011  . Disequilibrium syndrome 09/27/2011  . Chronic bronchitis ([place]) 06/05/2011  . Essential hypertension 05/24/2011  . Type 2 diabetes mellitus without complications ([place]) 32/54/9826  . Pulmonary nodules 05/05/2011    Andrey Spearman, MS, OTR/L, [facility] 09/19/19 10:20 AM  [place] MAIN [facility] SERVICES [address] [place], [place], [address] Phone: [phone]   Fax:  [phone]  Name: Susan Callahan MRN: [id number] Date of Birth: 08-06-1951

## 2019-09-23 ENCOUNTER — Ambulatory Visit: Payer: Medicare Other | Admitting: Occupational Therapy

## 2019-09-23 ENCOUNTER — Other Ambulatory Visit: Payer: Self-pay

## 2019-09-23 DIAGNOSIS — I972 Postmastectomy lymphedema syndrome: Secondary | ICD-10-CM | POA: Diagnosis not present

## 2019-09-23 NOTE — Therapy (Signed)
[place] MAIN [facility] SERVICES [address] [place], [place], [address] Phone: [phone]   Fax:  [phone]  Occupational Therapy Treatment  Patient Details  Name: Susan Callahan MRN: [id number] Date of Birth: May 22, 1951 Referring Provider (OT): Humberto Leep, MD   Encounter Date: 09/23/2019  OT End of Session - 09/23/19 0300    Visit Number  76    Number of Visits  72    Date for OT Re-Evaluation  12/18/19    OT Start Time  0210    OT Stop Time  0310    OT Time Calculation (min)  60 min    Activity Tolerance  Patient tolerated treatment well;No increased pain    Behavior During Therapy  WFL for tasks assessed/performed       Past Medical History:  Diagnosis Date  . COPD (chronic obstructive pulmonary disease) ([place])   . Depression 1974  . Diabetes mellitus without complication ([place])   . Hypertension   . Migraine   . 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 LUMBAR PERITONEAL SHUNT    . TYMPANOSTOMY TUBE PLACEMENT      There were no vitals filed for this visit.  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 Pt does not rate RUE cording related pain today. She c/o wrist pain from repetative stress hand injury.                   OT Treatments/Exercises (OP) - 09/23/19 0001      ADLs   ADL Education Given  Yes      Manual Therapy   Manual Therapy  Edema management;Manual Lymphatic Drainage (MLD);Compression Bandaging;Myofascial release    Soft tissue mobilization  MFR to R axilla and medial volar UE - no cord release observ    Myofascial Release  for cord release R axilla    Manual Lymphatic Drainage (MLD)  MLD to RUE/RUQ as established using cntalateral AAA    Compression Bandaging  Pt donns custom compression glove and sleeve with modified  independence after session using nylon slippy and friction gloves.             OT Education - 09/23/19 1532    Education Details  Cont Pt edu for LE self care throughout session. Excellent return.    Person(s) Educated  Patient    Methods  Demonstration;Explanation;Handout    Comprehension  Verbalized understanding;Returned demonstration;Need further instruction          OT Long Term Goals - 08/11/19 1500      OT LONG TERM GOAL #1   Title  Pt independent with modified lymphedema precautions and prevention strategies to limit LE progression.    Baseline  Max A    Time  4    Period  Days    Status  Achieved      OT LONG TERM GOAL #2   Title  Pt will be independent with lymphedema self-care home program and AWS ther ex to improve functional performance of basic and instrumental ADLs by DC.    Baseline  Max A    Time  12    Period  Weeks    Status  Achieved      OT LONG TERM GOAL #3   Title  Pt wil report 0/10 pain with RUE  functional use ( reaching, lifting, carrying, dressing, dressing, etc) to achieve independent , pain free ADLs performance.    Baseline  Max A 05/05/19: Reporting 4/10 lymphadema pain now combined with AWS related pain. 05/22/19 AWS pain resolved. Pt now experiencing pain in hand and distal FA associated with recent onset of lymphedema.    Time  12    Period  Weeks    Status  Achieved      OT LONG TERM GOAL #4   Title  Pt will be able to apply gradient compression wraps using correct techniques independently to control UE swelling and limit LE progression.    Baseline  Max A    Time  1    Period  Weeks    Status  Achieved      OT LONG TERM GOAL #5   Title  After skilled trainng Pt will be able to don and doff  appropriate compression garments and/ or devices using correct techniques and assistive devices with extra time  by end of Intensive Phase CDT for optimal LE management to limit progression over time.    Baseline  Max A    Time  12    Period   Weeks    Status  Achieved      OT LONG TERM GOAL #6   Title  Due to onset of RUE swelling concentrated primarily in R hand during manual therapy for cording  Pt will achieve 5%, or greater RUE  limb volume reductions during Intensive Phase CDT to  improve ADLs performances, to reduce infection risk, and to limit LE progression.    Baseline  Max A    Time  12    Period  Weeks    Status  Partially Met   hand swelling continues to fluctuate           Plan - 09/23/19 1533    Clinical Impression Statement  Able to mobilize cords at antecubital fossa with increased bowstringing today suggesting cords have and are continuing to lenthen with manual techniques. Pt tolerated MLD and MFR with out difficulty today. Cont as per POC.    OT Occupational Profile and History  Comprehensive Assessment- Review of records and extensive additional review of physical, cognitive, psychosocial history related to current functional performance    Occupational performance deficits (Please refer to evaluation for details):  ADL's;IADL's;Social Participation;Leisure;Rest and Sleep;Work    Marketing executive / Function / Physical Skills  ADL;Decreased knowledge of precautions;ROM;UE functional use;Decreased knowledge of use of DME;Edema;Skin integrity;Pain;IADL    Rehab Potential  Good    Clinical Decision Making  Several treatment options, min-mod task modification necessary    Comorbidities Affecting Occupational Performance:  Presence of comorbidities impacting occupational performance    Comorbidities impacting occupational performance description:  see SUBJUECTIVE    Modification or Assistance to Complete Evaluation   Min-Moderate modification of tasks or assist with assess necessary to complete eval    OT Frequency  2x / week    OT Duration  12 weeks    OT Treatment/Interventions  Self-care/ADL training;Therapeutic exercise;Manual lymph drainage;Patient/family education;Therapeutic activities;DME and/or AE  instruction;Manual Therapy;Other (comment)   consider fitting with prophylactic cmopression arm sleeve   Plan  BUE AROM ther ex to reduce axillary cording- 2 sets of 10 2-3 x daily    Consulted and Agree with Plan of Care  Patient       Patient will benefit from skilled therapeutic intervention in order to improve the following deficits and impairments:  Body Structure / Function / Physical Skills: ADL, Decreased knowledge of precautions, ROM, UE functional use, Decreased knowledge of use of DME, Edema, Skin integrity, Pain, IADL       Visit Diagnosis: Postmastectomy lymphedema syndrome    Problem List Patient Active Problem List   Diagnosis Date Noted  . Edema 04/16/2019  . Postoperative wound infection 03/13/2019  . Seroma of breast 03/11/2019  . Age-related osteoporosis without current pathological fracture 02/27/2019  . Malignant neoplasm of central portion of right breast in female, estrogen receptor positive ([place]) 02/12/2019  . Genetic testing 01/31/2019  . Malignant neoplasm of upper-inner quadrant of right breast in female, estrogen receptor positive ([place]) 01/15/2019  . Long term current use of opiate analgesic 12/02/2018  . Tympanic membrane central perforation, left 11/04/2018  . DDD (degenerative disc disease), lumbosacral 10/10/2018  . Abnormal MRI, lumbar spine (08/29/2018) 10/10/2018  . 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 Lumbar nerve root impingement (Bilateral: L4 & L5) (L>R: L5) 10/10/2018  . Lumbar Spinal instability of L4-5 10/10/2018  . Lumbar facet hypertrophy 10/10/2018  . COPD (chronic obstructive pulmonary disease) with emphysema ([place]) 08/28/2018  . Elevated rheumatoid factor 08/13/2018  . Elevated C-reactive protein (CRP) 08/12/2018  . Elevated sed rate 08/12/2018  . Lumbar facet syndrome (Bilateral) 08/12/2018  . Back pain with left-sided radiculopathy 08/12/2018  . DDD (degenerative disc disease),  lumbar 08/12/2018  . Chronic lower extremity pain (Primary Area of Pain) (Bilateral) (L>R) 07/23/2018  . Chronic low back pain (Secondary Area of Pain) (Bilateral) (L>R) w/ sciatica (Bilateral) 07/23/2018  . Chronic pain syndrome 07/23/2018  . Pharmacologic therapy 07/23/2018  . Disorder of skeletal system 07/23/2018  . Problems influencing health status 07/23/2018  . Chronic sacroiliac joint pain [facility] Area of Pain) (Bilateral) (L>R) 07/23/2018  . Status post lumboperitoneal shunt placement 02/26/2018  . 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 Tympanic membrane perforation 05/30/2016  . PMB (postmenopausal bleeding) 04/26/2016  . Rash 12/27/2015  . 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 Plantar fasciitis of foot (Left) 05/26/2015  . Spondylosis of lumbar region without myelopathy or radiculopathy 05/26/2015  . Lumbar foraminal stenosis (L3-4, L4-5, L5-S1) (L>R) 05/26/2015  . Spinal stenosis of lumbar region with neurogenic claudication 05/26/2015  . Encounter for screening mammogram for malignant neoplasm of breast 12/02/2014  . Goiter 12/02/2014  . Chronic idiopathic gout of foot (Right) 07/06/2014  . Biceps tendonitis (Right) 05/19/2014  . Shoulder pain (Right) 05/19/2014  . Facial weakness 05/05/2014  . Post herpetic neuralgia 05/05/2014  . Chronic dysfunction of left eustachian tube 03/24/2014  . Osteopenia 03/13/2014  . Lumbar Grade 1 Anterolisthesis of L3/4 (70m) & L4/5 (4-188m(w/ Dynamic instability) 03/13/2014  . Family history of colon cancer 12/02/2013  . Dyshidrotic eczema 11/17/2013  . Diastasis recti 04/08/2013  . Facial nerve paresis 07/31/2012  . Blepharospasm 05/08/2012  . 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 Obesity 03/28/2012  . Hemoptysis 12/22/2011  . Neuropathic postherpetic trigeminal neuralgia 12/12/2011  . Allergic rhinitis,  mild 10/12/2011  . Disequilibrium syndrome 09/27/2011  . Chronic bronchitis ([facility]09/24/2012  . Essential hypertension 05/24/2011  . Type 2 diabetes mellitus without complications ([place]0969/79/4801. Pulmonary nodules 05/05/2011    ThAndrey SpearmanMS, OTR/L, CLSpringfield Regional Medical Ctr-Er1/12/21 3:36 PM  [place] RES[facility] LLCERVICES [address]hone: [phone]  Fax:  [phone]  Name: Susan Callahan MRN: [id number] Date of Birth: 1951/06/02

## 2019-09-25 ENCOUNTER — Other Ambulatory Visit: Payer: Self-pay

## 2019-09-25 ENCOUNTER — Ambulatory Visit: Payer: Medicare Other | Admitting: Occupational Therapy

## 2019-09-25 DIAGNOSIS — I972 Postmastectomy lymphedema syndrome: Secondary | ICD-10-CM | POA: Diagnosis not present

## 2019-09-25 NOTE — Therapy (Signed)
[place] MAIN [facility] SERVICES [address] [place], [place], [address] Phone: [phone]   Fax:  [phone]  Occupational Therapy Treatment  Patient Details  Name: Susan Callahan MRN: [id number] Date of Birth: 1951/07/03 Referring Provider (OT): Humberto Leep, MD   Encounter Date: 09/25/2019  OT End of Session - 09/25/19 1642    Visit Number  19    Number of Visits  72    Date for OT Re-Evaluation  12/18/19    OT Start Time  0210    OT Stop Time  0310    OT Time Calculation (min)  60 min    Activity Tolerance  Patient tolerated treatment well;No increased pain    Behavior During Therapy  WFL for tasks assessed/performed       Past Medical History:  Diagnosis Date  . COPD (chronic obstructive pulmonary disease) ([place])   . Depression 1974  . Diabetes mellitus without complication ([place])   . Hypertension   . Migraine   . 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 LUMBAR PERITONEAL SHUNT    . TYMPANOSTOMY TUBE PLACEMENT      There were no vitals filed for this visit.  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 Pt does not rate RUE cording related pain today. Pt    reports she saw breast surgeon earlier this week and she feels sclerotic changes in cording may be a good sign        , and Pt will benefit from comntinued OT fo AWS.                   OT Treatments/Exercises (OP) - 09/25/19 0001      ADLs   ADL Education Given  Yes      Manual Therapy   Manual Therapy  Edema management;Myofascial release    Soft tissue mobilization  MFR to R axilla and medial volar UE - no cord release observ    Manual Lymphatic Drainage (MLD)  MLD to RUE/RUQ as established using cntalateral AAA    Compression Bandaging  Pt donns custom compression glove and sleeve with  modified independence after session using nylon slippy and friction gloves.             OT Education - 09/25/19 1642    Education Details  Cont Pt edu for LE self care throughout session. Excellent return.    Person(s) Educated  Patient    Methods  Demonstration;Explanation;Handout    Comprehension  Verbalized understanding;Returned demonstration;Need further instruction          OT Long Term Goals - 08/11/19 1500      OT LONG TERM GOAL #1   Title  Pt independent with modified lymphedema precautions and prevention strategies to limit LE progression.    Baseline  Max A    Time  4    Period  Days    Status  Achieved      OT LONG TERM GOAL #2   Title  Pt will be independent with lymphedema self-care home program and AWS ther ex to improve functional performance of basic and instrumental ADLs by DC.    Baseline  Max A    Time  12    Period  Weeks    Status  Achieved  OT LONG TERM GOAL #3   Title  Pt wil report 0/10 pain with RUE functional use ( reaching, lifting, carrying, dressing, dressing, etc) to achieve independent , pain free ADLs performance.    Baseline  Max A 05/05/19: Reporting 4/10 lymphadema pain now combined with AWS related pain. 05/22/19 AWS pain resolved. Pt now experiencing pain in hand and distal FA associated with recent onset of lymphedema.    Time  12    Period  Weeks    Status  Achieved      OT LONG TERM GOAL #4   Title  Pt will be able to apply gradient compression wraps using correct techniques independently to control UE swelling and limit LE progression.    Baseline  Max A    Time  1    Period  Weeks    Status  Achieved      OT LONG TERM GOAL #5   Title  After skilled trainng Pt will be able to don and doff  appropriate compression garments and/ or devices using correct techniques and assistive devices with extra time  by end of Intensive Phase CDT for optimal LE management to limit progression over time.    Baseline  Max A    Time  12     Period  Weeks    Status  Achieved      OT LONG TERM GOAL #6   Title  Due to onset of RUE swelling concentrated primarily in R hand during manual therapy for cording  Pt will achieve 5%, or greater RUE  limb volume reductions during Intensive Phase CDT to  improve ADLs performances, to reduce infection risk, and to limit LE progression.    Baseline  Max A    Time  12    Period  Weeks    Status  Partially Met   hand swelling continues to fluctuate           Plan - 09/25/19 1643    Clinical Impression Statement  Able to mobilize cords at antecubital fossa with increased bowstringing today suggesting cords have and are continuing to lenthen with manual techniques. Pt tolerated MLD and MFR with out difficulty today. Cont as per POC.    OT Occupational Profile and History  Comprehensive Assessment- Review of records and extensive additional review of physical, cognitive, psychosocial history related to current functional performance    Occupational performance deficits (Please refer to evaluation for details):  ADL's;IADL's;Social Participation;Leisure;Rest and Sleep;Work    Marketing executive / Function / Physical Skills  ADL;Decreased knowledge of precautions;ROM;UE functional use;Decreased knowledge of use of DME;Edema;Skin integrity;Pain;IADL    Rehab Potential  Good    Clinical Decision Making  Several treatment options, min-mod task modification necessary    Comorbidities Affecting Occupational Performance:  Presence of comorbidities impacting occupational performance    Comorbidities impacting occupational performance description:  see SUBJUECTIVE    Modification or Assistance to Complete Evaluation   Min-Moderate modification of tasks or assist with assess necessary to complete eval    OT Frequency  2x / week    OT Duration  12 weeks    OT Treatment/Interventions  Self-care/ADL training;Therapeutic exercise;Manual lymph drainage;Patient/family education;Therapeutic activities;DME  and/or AE instruction;Manual Therapy;Other (comment)   consider fitting with prophylactic cmopression arm sleeve   Plan  BUE AROM ther ex to reduce axillary cording- 2 sets of 10 2-3 x daily    Consulted and Agree with Plan of Care  Patient       Patient  will benefit from skilled therapeutic intervention in order to improve the following deficits and impairments:   Body Structure / Function / Physical Skills: ADL, Decreased knowledge of precautions, ROM, UE functional use, Decreased knowledge of use of DME, Edema, Skin integrity, Pain, IADL       Visit Diagnosis: Postmastectomy lymphedema syndrome    Problem List Patient Active Problem List   Diagnosis Date Noted  . Edema 04/16/2019  . Postoperative wound infection 03/13/2019  . Seroma of breast 03/11/2019  . Age-related osteoporosis without current pathological fracture 02/27/2019  . Malignant neoplasm of central portion of right breast in female, estrogen receptor positive ([place]) 02/12/2019  . Genetic testing 01/31/2019  . Malignant neoplasm of upper-inner quadrant of right breast in female, estrogen receptor positive ([place]) 01/15/2019  . Long term current use of opiate analgesic 12/02/2018  . Tympanic membrane central perforation, left 11/04/2018  . DDD (degenerative disc disease), lumbosacral 10/10/2018  . Abnormal MRI, lumbar spine (08/29/2018) 10/10/2018  . 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 Lumbar nerve root impingement (Bilateral: L4 & L5) (L>R: L5) 10/10/2018  . Lumbar Spinal instability of L4-5 10/10/2018  . Lumbar facet hypertrophy 10/10/2018  . COPD (chronic obstructive pulmonary disease) with emphysema ([place]) 08/28/2018  . Elevated rheumatoid factor 08/13/2018  . Elevated C-reactive protein (CRP) 08/12/2018  . Elevated sed rate 08/12/2018  . Lumbar facet syndrome (Bilateral) 08/12/2018  . Back pain with left-sided radiculopathy 08/12/2018  . DDD (degenerative disc  disease), lumbar 08/12/2018  . Chronic lower extremity pain (Primary Area of Pain) (Bilateral) (L>R) 07/23/2018  . Chronic low back pain (Secondary Area of Pain) (Bilateral) (L>R) w/ sciatica (Bilateral) 07/23/2018  . Chronic pain syndrome 07/23/2018  . Pharmacologic therapy 07/23/2018  . Disorder of skeletal system 07/23/2018  . Problems influencing health status 07/23/2018  . Chronic sacroiliac joint pain [facility] Area of Pain) (Bilateral) (L>R) 07/23/2018  . Status post lumboperitoneal shunt placement 02/26/2018  . 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 Tympanic membrane perforation 05/30/2016  . PMB (postmenopausal bleeding) 04/26/2016  . Rash 12/27/2015  . 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 Plantar fasciitis of foot (Left) 05/26/2015  . Spondylosis of lumbar region without myelopathy or radiculopathy 05/26/2015  . Lumbar foraminal stenosis (L3-4, L4-5, L5-S1) (L>R) 05/26/2015  . Spinal stenosis of lumbar region with neurogenic claudication 05/26/2015  . Encounter for screening mammogram for malignant neoplasm of breast 12/02/2014  . Goiter 12/02/2014  . Chronic idiopathic gout of foot (Right) 07/06/2014  . Biceps tendonitis (Right) 05/19/2014  . Shoulder pain (Right) 05/19/2014  . Facial weakness 05/05/2014  . Post herpetic neuralgia 05/05/2014  . Chronic dysfunction of left eustachian tube 03/24/2014  . Osteopenia 03/13/2014  . Lumbar Grade 1 Anterolisthesis of L3/4 (34m) & L4/5 (4-178m(w/ Dynamic instability) 03/13/2014  . Family history of colon cancer 12/02/2013  . Dyshidrotic eczema 11/17/2013  . Diastasis recti 04/08/2013  . Facial nerve paresis 07/31/2012  . Blepharospasm 05/08/2012  . 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 Obesity 03/28/2012  . Hemoptysis 12/22/2011  . Neuropathic postherpetic trigeminal neuralgia 12/12/2011  . Allergic  rhinitis, mild 10/12/2011  . Disequilibrium syndrome 09/27/2011  . Chronic bronchitis ([facility]09/24/2012  . Essential hypertension 05/24/2011  . Type 2 diabetes mellitus without complications ([place]CP[place]0939/76/7341. Pulmonary nodules 05/05/2011    ThAndrey SpearmanMS, OTR/L, CLSan Dimas Community Hospital1/14/21 4:44 PM  [place]  [facility] MAIN [facility] SERVICES [address] [place], [place], [address] Phone: [phone]   Fax:  [phone]  Name: Susan Callahan MRN: [id number] Date of Birth: 10-Sep-1951

## 2019-09-30 ENCOUNTER — Ambulatory Visit: Payer: Medicare Other | Admitting: Occupational Therapy

## 2019-09-30 ENCOUNTER — Other Ambulatory Visit: Payer: Self-pay

## 2019-09-30 DIAGNOSIS — I972 Postmastectomy lymphedema syndrome: Secondary | ICD-10-CM | POA: Diagnosis not present

## 2019-09-30 NOTE — Therapy (Signed)
[place] MAIN [facility] SERVICES [address] [place], [place], [address] Phone: [phone]   Fax:  [phone]  Occupational Therapy Treatment Note and Progress Report  Patient Details  Name: Susan Callahan MRN: [id number] Date of Birth: 28-Sep-1950 Referring Provider (OT): Humberto Leep, MD   Encounter Date: 09/30/2019  OT End of Session - 09/30/19 1631    Visit Number  60    Number of Visits  72    Date for OT Re-Evaluation  12/18/19    OT Start Time  0200    OT Stop Time  0310    OT Time Calculation (min)  70 min    Activity Tolerance  Patient tolerated treatment well;No increased pain    Behavior During Therapy  WFL for tasks assessed/performed       Past Medical History:  Diagnosis Date  . COPD (chronic obstructive pulmonary disease) ([place])   . Depression 1974  . Diabetes mellitus without complication ([place])   . Hypertension   . Migraine   . 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 LUMBAR PERITONEAL SHUNT    . TYMPANOSTOMY TUBE PLACEMENT      There were no vitals filed for this visit.  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 Pt does not rate RUE cording related pain today. Pt    reports she saw breast surgeon earlier this week and she feels sclerotic changes in cording may be a good sign        , and Pt will benefit from comntinued OT fo AWS.          LYMPHEDEMA/ONCOLOGY QUESTIONNAIRE - 09/30/19 1627      Right Upper Extremity Lymphedema   Other  RUE limb volume = 2342.86 ml    Other  RUE limb volume is decreased overall since commencing OT fopr CDT by 18.39%    Other  LVD = -9.86%, L>R      Left Upper Extremity Lymphedema   Other  LUE limb volume = 2573.96 ml    Other  LUE limb volume is reduced by 3.81% since 02/19/19              OT  Treatments/Exercises (OP) - 09/30/19 0001      ADLs   ADL Education Given  Yes      Manual Therapy   Manual Therapy  Edema management    Manual therapy comments  RUE compararive limb volumetrics    Manual Lymphatic Drainage (MLD)  --    Compression Bandaging  Pt donns custom compression glove and sleeve with modified independence after session using nylon slippy and friction gloves.             OT Education - 09/30/19 1630    Education Details  Cont Pt edu for LE self care throughout session. Excellent return.    Person(s) Educated  Patient    Methods  Demonstration;Explanation;Handout    Comprehension  Verbalized understanding;Returned demonstration;Need further instruction          OT Long Term Goals - 09/30/19 1600      OT LONG TERM GOAL #1   Title  Pt independent with modified lymphedema precautions and prevention strategies to limit LE progression.    Status  Achieved      OT LONG TERM GOAL #2  Title  Pt will be independent with lymphedema self-care home program and AWS ther ex to improve functional performance of basic and instrumental ADLs by DC.    Status  Achieved      OT LONG TERM GOAL #3   Title  Pt wil report 0/10 pain with RUE functional use ( reaching, lifting, carrying, dressing, dressing, etc) to achieve independent , pain free ADLs performance.    Status  Achieved      OT LONG TERM GOAL #4   Title  Pt will be able to apply gradient compression wraps using correct techniques independently to control UE swelling and limit LE progression.    Status  Achieved      OT LONG TERM GOAL #5   Title  After skilled trainng Pt will be able to don and doff  appropriate compression garments and/ or devices using correct techniques and assistive devices with extra time  by end of Intensive Phase CDT for optimal LE management to limit progression over time.    Status  Achieved      OT LONG TERM GOAL #6   Title  Due to onset of RUE swelling concentrated primarily  in R hand during manual therapy for cording  Pt will achieve 5%, or greater RUE  limb volume reductions during Intensive Phase CDT to  improve ADLs performances, to reduce infection risk, and to limit LE progression.    Status  Achieved            Plan - 09/30/19 1631    Clinical Impression Statement  Completed BUE comparative limb volumetrics. RUE  Rx limb volume is decreased overall since commencing OT for CDT  on 02/19/2019 by 18.39%. The non-dominant, non-Rx limb, the  LUE , is decreased in volume by 3.81%   since commencing OT, which is likely     due to slight weight loss vs non-use by Pt report, Limb volume differential  today measures -9.86%, L>R, indicating that non dominant, non-RX LUE is now greater in volume than the RX , dominant RUE. Minimal and localized,  but obvious swelling persist over doral hand at MPs of digits 2 and 3. No palpable , visible or measurable LE is bserved in the RUE today. Sleve and glove are in good condition and Pt wears diligently. Pt agrees with plan to perform trial by wearing glove daily as she has been habituated to do, but she will use sleeve only when performing activities and tasks requiring work against gravity, such as ther ex,, lifting and carrying groceries, etc. We'll check limb volumes again at end of Feb. We'll decrease OT frequency to 1 x weekly after next week. Cont as per POC.    OT Occupational Profile and History  Comprehensive Assessment- Review of records and extensive additional review of physical, cognitive, psychosocial history related to current functional performance    Occupational performance deficits (Please refer to evaluation for details):  ADL's;IADL's;Social Participation;Leisure;Rest and Sleep;Work    Marketing executive / Function / Physical Skills  ADL;Decreased knowledge of precautions;ROM;UE functional use;Decreased knowledge of use of DME;Edema;Skin integrity;Pain;IADL    Rehab Potential  Good    Clinical Decision Making  Several  treatment options, min-mod task modification necessary    Comorbidities Affecting Occupational Performance:  Presence of comorbidities impacting occupational performance    Comorbidities impacting occupational performance description:  see SUBJUECTIVE    Modification or Assistance to Complete Evaluation   Min-Moderate modification of tasks or assist with assess necessary to complete eval  OT Frequency  2x / week    OT Duration  12 weeks    OT Treatment/Interventions  Self-care/ADL training;Therapeutic exercise;Manual lymph drainage;Patient/family education;Therapeutic activities;DME and/or AE instruction;Manual Therapy;Other (comment)   consider fitting with prophylactic cmopression arm sleeve   Plan  BUE AROM ther ex to reduce axillary cording- 2 sets of 10 2-3 x daily    Consulted and Agree with Plan of Care  Patient       Patient will benefit from skilled therapeutic intervention in order to improve the following deficits and impairments:   Body Structure / Function / Physical Skills: ADL, Decreased knowledge of precautions, ROM, UE functional use, Decreased knowledge of use of DME, Edema, Skin integrity, Pain, IADL       Visit Diagnosis: Postmastectomy lymphedema syndrome    Problem List Patient Active Problem List   Diagnosis Date Noted  . Edema 04/16/2019  . Postoperative wound infection 03/13/2019  . Seroma of breast 03/11/2019  . Age-related osteoporosis without current pathological fracture 02/27/2019  . Malignant neoplasm of central portion of right breast in female, estrogen receptor positive ([place]) 02/12/2019  . Genetic testing 01/31/2019  . Malignant neoplasm of upper-inner quadrant of right breast in female, estrogen receptor positive ([place]) 01/15/2019  . Long term current use of opiate analgesic 12/02/2018  . Tympanic membrane central perforation, left 11/04/2018  . DDD (degenerative disc disease), lumbosacral 10/10/2018  . Abnormal MRI, lumbar spine (08/29/2018)  10/10/2018  . 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 Lumbar nerve root impingement (Bilateral: L4 & L5) (L>R: L5) 10/10/2018  . Lumbar Spinal instability of L4-5 10/10/2018  . Lumbar facet hypertrophy 10/10/2018  . COPD (chronic obstructive pulmonary disease) with emphysema ([place]) 08/28/2018  . Elevated rheumatoid factor 08/13/2018  . Elevated C-reactive protein (CRP) 08/12/2018  . Elevated sed rate 08/12/2018  . Lumbar facet syndrome (Bilateral) 08/12/2018  . Back pain with left-sided radiculopathy 08/12/2018  . DDD (degenerative disc disease), lumbar 08/12/2018  . Chronic lower extremity pain (Primary Area of Pain) (Bilateral) (L>R) 07/23/2018  . Chronic low back pain (Secondary Area of Pain) (Bilateral) (L>R) w/ sciatica (Bilateral) 07/23/2018  . Chronic pain syndrome 07/23/2018  . Pharmacologic therapy 07/23/2018  . Disorder of skeletal system 07/23/2018  . Problems influencing health status 07/23/2018  . Chronic sacroiliac joint pain [facility] Area of Pain) (Bilateral) (L>R) 07/23/2018  . Status post lumboperitoneal shunt placement 02/26/2018  . 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 Tympanic membrane perforation 05/30/2016  . PMB (postmenopausal bleeding) 04/26/2016  . Rash 12/27/2015  . 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 Plantar fasciitis of foot (Left) 05/26/2015  . Spondylosis of lumbar region without myelopathy or radiculopathy 05/26/2015  . Lumbar foraminal stenosis (L3-4, L4-5, L5-S1) (L>R) 05/26/2015  . Spinal stenosis of lumbar region with neurogenic claudication 05/26/2015  . Encounter for screening mammogram for malignant neoplasm of breast 12/02/2014  . Goiter 12/02/2014  . Chronic idiopathic gout of foot (Right) 07/06/2014  . Biceps tendonitis (Right) 05/19/2014  . Shoulder pain (Right) 05/19/2014  . Facial weakness 05/05/2014  . Post herpetic  neuralgia 05/05/2014  . Chronic dysfunction of left eustachian tube 03/24/2014  . Osteopenia 03/13/2014  . Lumbar Grade 1 Anterolisthesis of L3/4 (3mm) & L4/5 (4-23mm)(w/ Dynamic instability) 03/13/2014  . Family history of colon cancer 12/02/2013  . Dyshidrotic eczema 11/17/2013  . Diastasis recti 04/08/2013  . Facial nerve paresis 07/31/2012  . Blepharospasm 05/08/2012  .  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 Obesity 03/28/2012  . Hemoptysis 12/22/2011  . Neuropathic postherpetic trigeminal neuralgia 12/12/2011  . Allergic rhinitis, mild 10/12/2011  . Disequilibrium syndrome 09/27/2011  . Chronic bronchitis ([place]) 06/05/2011  . Essential hypertension 05/24/2011  . Type 2 diabetes mellitus without complications ([place]) 07/10/1313  . Pulmonary nodules 05/05/2011    Andrey Spearman, MS, OTR/L, [facility] 09/30/19 4:41 PM  [place] MAIN [facility] SERVICES [address] [place], [place], [address] Phone: [phone]   Fax:  [phone]  Name: Susan Callahan MRN: [id number] Date of Birth: 09/20/1950

## 2019-10-02 ENCOUNTER — Other Ambulatory Visit: Payer: Self-pay

## 2019-10-02 ENCOUNTER — Ambulatory Visit: Payer: Medicare Other | Admitting: Occupational Therapy

## 2019-10-02 ENCOUNTER — Encounter: Payer: Medicare Other | Admitting: Occupational Therapy

## 2019-10-02 DIAGNOSIS — I972 Postmastectomy lymphedema syndrome: Secondary | ICD-10-CM

## 2019-10-02 NOTE — Therapy (Signed)
[place] MAIN [facility] SERVICES [address] [place], [place], [address] Phone: [phone]   Fax:  [phone]  Occupational Therapy Treatment  Patient Details  Name: Susan Callahan MRN: [id number] Date of Birth: 1951-07-14 Referring Provider (OT): Humberto Leep, MD   Encounter Date: 10/02/2019  OT End of Session - 10/02/19 1123    Visit Number  61    Number of Visits  72    Date for OT Re-Evaluation  12/18/19    OT Start Time  1000    OT Stop Time  1110    OT Time Calculation (min)  70 min    Activity Tolerance  Patient tolerated treatment well;No increased pain    Behavior During Therapy  WFL for tasks assessed/performed       Past Medical History:  Diagnosis Date  . COPD (chronic obstructive pulmonary disease) ([place])   . Depression 1974  . Diabetes mellitus without complication ([place])   . Hypertension   . Migraine   . 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 LUMBAR PERITONEAL SHUNT    . TYMPANOSTOMY TUBE PLACEMENT      There were no vitals filed for this visit.  Subjective Assessment - 10/02/19 1013    Subjective   Pt presents for OT visit 61/72 (36) to address RUE/ RUQ post-mastectomy lymphedema (hand) and ongoing AWS. Pt does not rate RUE cording related pain today.Pt reports she did brief trial with glove only last night when working on the computer.                   OT Treatments/Exercises (OP) - 10/02/19 0001      ADLs   ADL Education Given  Yes      Manual Therapy   Manual Therapy  Edema management;Manual Lymphatic Drainage (MLD)    Soft tissue mobilization  MFR to R axilla and medial volar UE - no cord release observ    Myofascial Release  for cord release R axilla    Manual Lymphatic Drainage (MLD)  MLD to RUE/RUQ as established using cntalateral AAA    Compression Bandaging  Pt donns custom compression glove and sleeve with modified independence  after session using nylon slippy and friction gloves.             OT Education - 10/02/19 1123    Education Details  Cont Pt edu for LE self care throughout session. Excellent return.    Person(s) Educated  Patient    Methods  Demonstration;Explanation;Handout    Comprehension  Verbalized understanding;Returned demonstration;Need further instruction          OT Long Term Goals - 09/30/19 1600      OT LONG TERM GOAL #1   Title  Pt independent with modified lymphedema precautions and prevention strategies to limit LE progression.    Status  Achieved      OT LONG TERM GOAL #2   Title  Pt will be independent with lymphedema self-care home program and AWS ther ex to improve functional performance of basic and instrumental ADLs by DC.    Status  Achieved      OT LONG TERM GOAL #3   Title  Pt wil report 0/10 pain with RUE functional use ( reaching, lifting, carrying, dressing, dressing, etc) to achieve independent , pain free ADLs performance.    Status  Achieved      OT LONG TERM GOAL #4  Title  Pt will be able to apply gradient compression wraps using correct techniques independently to control UE swelling and limit LE progression.    Status  Achieved      OT LONG TERM GOAL #5   Title  After skilled trainng Pt will be able to don and doff  appropriate compression garments and/ or devices using correct techniques and assistive devices with extra time  by end of Intensive Phase CDT for optimal LE management to limit progression over time.    Status  Achieved      OT LONG TERM GOAL #6   Title  Due to onset of RUE swelling concentrated primarily in R hand during manual therapy for cording  Pt will achieve 5%, or greater RUE  limb volume reductions during Intensive Phase CDT to  improve ADLs performances, to reduce infection risk, and to limit LE progression.    Status  Achieved            Plan - 10/02/19 1124    Clinical Impression Statement  Provided MLD and MFR to  RUE/RUQ as established. Able to palpate readily visible cording at R antecubital fossa. Pt denies pain with manual therapy. Compression sleeve relieves cording pain by report. Pt approved for donated refurbished compression pump, which is to be delivered this Friday. Pt will attampt set up on her own suing DVD, and cqall trainer in [employer]. Cont as per POC decreasing OT frequency from 2 x week to 1 x weekly in Feb.    OT Occupational Profile and History  Comprehensive Assessment- Review of records and extensive additional review of physical, cognitive, psychosocial history related to current functional performance    Occupational performance deficits (Please refer to evaluation for details):  ADL's;IADL's;Social Participation;Leisure;Rest and Sleep;Work    Marketing executive / Function / Physical Skills  ADL;Decreased knowledge of precautions;ROM;UE functional use;Decreased knowledge of use of DME;Edema;Skin integrity;Pain;IADL    Rehab Potential  Good    Clinical Decision Making  Several treatment options, min-mod task modification necessary    Comorbidities Affecting Occupational Performance:  Presence of comorbidities impacting occupational performance    Comorbidities impacting occupational performance description:  see SUBJUECTIVE    Modification or Assistance to Complete Evaluation   Min-Moderate modification of tasks or assist with assess necessary to complete eval    OT Frequency  2x / week    OT Duration  12 weeks    OT Treatment/Interventions  Self-care/ADL training;Therapeutic exercise;Manual lymph drainage;Patient/family education;Therapeutic activities;DME and/or AE instruction;Manual Therapy;Other (comment)   consider fitting with prophylactic cmopression arm sleeve   Plan  BUE AROM ther ex to reduce axillary cording- 2 sets of 10 2-3 x daily    Consulted and Agree with Plan of Care  Patient       Patient will benefit from skilled therapeutic intervention in order to improve the following  deficits and impairments:   Body Structure / Function / Physical Skills: ADL, Decreased knowledge of precautions, ROM, UE functional use, Decreased knowledge of use of DME, Edema, Skin integrity, Pain, IADL       Visit Diagnosis: Postmastectomy lymphedema syndrome    Problem List Patient Active Problem List   Diagnosis Date Noted  . Edema 04/16/2019  . Postoperative wound infection 03/13/2019  . Seroma of breast 03/11/2019  . Age-related osteoporosis without current pathological fracture 02/27/2019  . Malignant neoplasm of central portion of right breast in female, estrogen receptor positive ([place]) 02/12/2019  . Genetic testing 01/31/2019  . Malignant neoplasm of upper-inner  quadrant of right breast in female, estrogen receptor positive ([place]) 01/15/2019  . Long term current use of opiate analgesic 12/02/2018  . Tympanic membrane central perforation, left 11/04/2018  . DDD (degenerative disc disease), lumbosacral 10/10/2018  . Abnormal MRI, lumbar spine (08/29/2018) 10/10/2018  . 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 Lumbar nerve root impingement (Bilateral: L4 & L5) (L>R: L5) 10/10/2018  . Lumbar Spinal instability of L4-5 10/10/2018  . Lumbar facet hypertrophy 10/10/2018  . COPD (chronic obstructive pulmonary disease) with emphysema ([place]) 08/28/2018  . Elevated rheumatoid factor 08/13/2018  . Elevated C-reactive protein (CRP) 08/12/2018  . Elevated sed rate 08/12/2018  . Lumbar facet syndrome (Bilateral) 08/12/2018  . Back pain with left-sided radiculopathy 08/12/2018  . DDD (degenerative disc disease), lumbar 08/12/2018  . Chronic lower extremity pain (Primary Area of Pain) (Bilateral) (L>R) 07/23/2018  . Chronic low back pain (Secondary Area of Pain) (Bilateral) (L>R) w/ sciatica (Bilateral) 07/23/2018  . Chronic pain syndrome 07/23/2018  . Pharmacologic therapy 07/23/2018  . Disorder of skeletal system 07/23/2018  . Problems  influencing health status 07/23/2018  . Chronic sacroiliac joint pain [facility] Area of Pain) (Bilateral) (L>R) 07/23/2018  . Status post lumboperitoneal shunt placement 02/26/2018  . 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 Tympanic membrane perforation 05/30/2016  . PMB (postmenopausal bleeding) 04/26/2016  . Rash 12/27/2015  . 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 Plantar fasciitis of foot (Left) 05/26/2015  . Spondylosis of lumbar region without myelopathy or radiculopathy 05/26/2015  . Lumbar foraminal stenosis (L3-4, L4-5, L5-S1) (L>R) 05/26/2015  . Spinal stenosis of lumbar region with neurogenic claudication 05/26/2015  . Encounter for screening mammogram for malignant neoplasm of breast 12/02/2014  . Goiter 12/02/2014  . Chronic idiopathic gout of foot (Right) 07/06/2014  . Biceps tendonitis (Right) 05/19/2014  . Shoulder pain (Right) 05/19/2014  . Facial weakness 05/05/2014  . Post herpetic neuralgia 05/05/2014  . Chronic dysfunction of left eustachian tube 03/24/2014  . Osteopenia 03/13/2014  . Lumbar Grade 1 Anterolisthesis of L3/4 (30mm) & L4/5 (4-66mm)(w/ Dynamic instability) 03/13/2014  . Family history of colon cancer 12/02/2013  . Dyshidrotic eczema 11/17/2013  . Diastasis recti 04/08/2013  . Facial nerve paresis 07/31/2012  . Blepharospasm 05/08/2012  . 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 Obesity 03/28/2012  . Hemoptysis 12/22/2011  . Neuropathic postherpetic trigeminal neuralgia 12/12/2011  . Allergic rhinitis, mild 10/12/2011  . Disequilibrium syndrome 09/27/2011  . Chronic bronchitis ([place]) 06/05/2011  . Essential hypertension 05/24/2011  . Type 2 diabetes mellitus without complications ([place]) 77/41/2878  . Pulmonary nodules 05/05/2011    Andrey Spearman, MS, OTR/L, [facility] 10/02/19 11:28 AM   [place] MAIN [facility] SERVICES [address] [place], [place], [address] Phone: [phone]   Fax:  [phone]  Name: Kaysie Michelini MRN: [id number] Date of Birth: 05-20-1951

## 2019-10-07 ENCOUNTER — Ambulatory Visit: Payer: Medicare Other | Admitting: Occupational Therapy

## 2019-10-07 ENCOUNTER — Other Ambulatory Visit: Payer: Self-pay

## 2019-10-07 DIAGNOSIS — I972 Postmastectomy lymphedema syndrome: Secondary | ICD-10-CM

## 2019-10-07 NOTE — Therapy (Signed)
[place] MAIN [facility] SERVICES [address] [place], [place], [address] Phone: [phone]   Fax:  [phone]  Occupational Therapy Treatment  Patient Details  Name: Susan Callahan MRN: [id number] Date of Birth: 1950-11-22 Referring Provider (OT): Humberto Leep, MD   Encounter Date: 10/07/2019  OT End of Session - 10/07/19 1523    Visit Number  16    Number of Visits  72    Date for OT Re-Evaluation  12/18/19    OT Start Time  0200    OT Stop Time  0315    OT Time Calculation (min)  75 min    Activity Tolerance  Patient tolerated treatment well;No increased pain    Behavior During Therapy  WFL for tasks assessed/performed       Past Medical History:  Diagnosis Date  . COPD (chronic obstructive pulmonary disease) ([place])   . Depression 1974  . Diabetes mellitus without complication ([place])   . Hypertension   . Migraine   . 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 LUMBAR PERITONEAL SHUNT    . TYMPANOSTOMY TUBE PLACEMENT      There were no vitals filed for this visit.  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 Pt presents with custom compression sleeve and glove in place. Pt has no new complaints. She reports she sees hand specialist tomorrow re wrist pain. She plans to ask him to examine and assess unresolving, localized, R dorsal hand swelling concentrated over index and 3rd digit MPs.                   OT Treatments/Exercises (OP) - 10/07/19 0001      Manual Therapy   Manual Therapy  Edema management;Myofascial release;Manual Lymphatic Drainage (MLD);Compression Bandaging    Soft tissue mobilization  MFR to R axilla and medial volar UE - no cord release observ    Myofascial Release  for cord release R axilla    Manual Lymphatic  Drainage (MLD)  MLD to RUE/RUQ as established using cntalateral AAA    Compression Bandaging  Pt donns custom compression glove and sleeve with modified independence after session using nylon slippy and friction gloves.             OT Education - 10/07/19 1522    Education Details  Cont Pt edu for LE self care throughout session. Excellent return.    Person(s) Educated  Patient    Methods  Demonstration;Explanation;Handout    Comprehension  Verbalized understanding;Returned demonstration;Need further instruction          OT Long Term Goals - 09/30/19 1600      OT LONG TERM GOAL #1   Title  Pt independent with modified lymphedema precautions and prevention strategies to limit LE progression.    Status  Achieved      OT LONG TERM GOAL #2   Title  Pt will be independent with lymphedema self-care home program and AWS ther ex to improve functional performance of basic and instrumental ADLs by DC.    Status  Achieved      OT LONG TERM GOAL #3   Title  Pt wil report 0/10 pain with RUE functional use ( reaching, lifting, carrying, dressing, dressing, etc) to achieve independent , pain free ADLs performance.  Status  Achieved      OT LONG TERM GOAL #4   Title  Pt will be able to apply gradient compression wraps using correct techniques independently to control UE swelling and limit LE progression.    Status  Achieved      OT LONG TERM GOAL #5   Title  After skilled trainng Pt will be able to don and doff  appropriate compression garments and/ or devices using correct techniques and assistive devices with extra time  by end of Intensive Phase CDT for optimal LE management to limit progression over time.    Status  Achieved      OT LONG TERM GOAL #6   Title  Due to onset of RUE swelling concentrated primarily in R hand during manual therapy for cording  Pt will achieve 5%, or greater RUE  limb volume reductions during Intensive Phase CDT to  improve ADLs performances, to reduce  infection risk, and to limit LE progression.    Status  Achieved            Plan - 10/07/19 1524    Clinical Impression Statement  Continued with RUE/RUQ MLD and MFR as established to reduce arm and hand swelling and to mobilize azillary cords   to improve pain free AROM at end ranges.  Able to palpate readily visible cording at R antecubital fossa. Pt denies pain with manual therapy. Swelling persists at dorsal hand over MPS  at thumb, index and 3rd digit. Lymphedema is typically more generalized   in distribution. Skin over swollen joints is not warm or red to the touch suggesting arthritis. Pt has full, pain free AROM of hand. Encouraged Pt to resume wearing remade custom glove as this garment provides best fit for optimal compression. Con as per POC.    OT Occupational Profile and History  Comprehensive Assessment- Review of records and extensive additional review of physical, cognitive, psychosocial history related to current functional performance    Occupational performance deficits (Please refer to evaluation for details):  ADL's;IADL's;Social Participation;Leisure;Rest and Sleep;Work    Marketing executive / Function / Physical Skills  ADL;Decreased knowledge of precautions;ROM;UE functional use;Decreased knowledge of use of DME;Edema;Skin integrity;Pain;IADL    Rehab Potential  Good    Clinical Decision Making  Several treatment options, min-mod task modification necessary    Comorbidities Affecting Occupational Performance:  Presence of comorbidities impacting occupational performance    Comorbidities impacting occupational performance description:  see SUBJUECTIVE    Modification or Assistance to Complete Evaluation   Min-Moderate modification of tasks or assist with assess necessary to complete eval    OT Frequency  2x / week    OT Duration  12 weeks    OT Treatment/Interventions  Self-care/ADL training;Therapeutic exercise;Manual lymph drainage;Patient/family education;Therapeutic  activities;DME and/or AE instruction;Manual Therapy;Other (comment)   consider fitting with prophylactic cmopression arm sleeve   Plan  BUE AROM ther ex to reduce axillary cording- 2 sets of 10 2-3 x daily    Consulted and Agree with Plan of Care  Patient       Patient will benefit from skilled therapeutic intervention in order to improve the following deficits and impairments:   Body Structure / Function / Physical Skills: ADL, Decreased knowledge of precautions, ROM, UE functional use, Decreased knowledge of use of DME, Edema, Skin integrity, Pain, IADL       Visit Diagnosis: Postmastectomy lymphedema syndrome    Problem List Patient Active Problem List   Diagnosis Date Noted  . Edema  04/16/2019  . Postoperative wound infection 03/13/2019  . Seroma of breast 03/11/2019  . Age-related osteoporosis without current pathological fracture 02/27/2019  . Malignant neoplasm of central portion of right breast in female, estrogen receptor positive ([place]) 02/12/2019  . Genetic testing 01/31/2019  . Malignant neoplasm of upper-inner quadrant of right breast in female, estrogen receptor positive ([place]) 01/15/2019  . Long term current use of opiate analgesic 12/02/2018  . Tympanic membrane central perforation, left 11/04/2018  . DDD (degenerative disc disease), lumbosacral 10/10/2018  . Abnormal MRI, lumbar spine (08/29/2018) 10/10/2018  . 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 Lumbar nerve root impingement (Bilateral: L4 & L5) (L>R: L5) 10/10/2018  . Lumbar Spinal instability of L4-5 10/10/2018  . Lumbar facet hypertrophy 10/10/2018  . COPD (chronic obstructive pulmonary disease) with emphysema ([place]) 08/28/2018  . Elevated rheumatoid factor 08/13/2018  . Elevated C-reactive protein (CRP) 08/12/2018  . Elevated sed rate 08/12/2018  . Lumbar facet syndrome (Bilateral) 08/12/2018  . Back pain with left-sided radiculopathy 08/12/2018  . DDD  (degenerative disc disease), lumbar 08/12/2018  . Chronic lower extremity pain (Primary Area of Pain) (Bilateral) (L>R) 07/23/2018  . Chronic low back pain (Secondary Area of Pain) (Bilateral) (L>R) w/ sciatica (Bilateral) 07/23/2018  . Chronic pain syndrome 07/23/2018  . Pharmacologic therapy 07/23/2018  . Disorder of skeletal system 07/23/2018  . Problems influencing health status 07/23/2018  . Chronic sacroiliac joint pain [facility] Area of Pain) (Bilateral) (L>R) 07/23/2018  . Status post lumboperitoneal shunt placement 02/26/2018  . 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 Tympanic membrane perforation 05/30/2016  . PMB (postmenopausal bleeding) 04/26/2016  . Rash 12/27/2015  . 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 Plantar fasciitis of foot (Left) 05/26/2015  . Spondylosis of lumbar region without myelopathy or radiculopathy 05/26/2015  . Lumbar foraminal stenosis (L3-4, L4-5, L5-S1) (L>R) 05/26/2015  . Spinal stenosis of lumbar region with neurogenic claudication 05/26/2015  . Encounter for screening mammogram for malignant neoplasm of breast 12/02/2014  . Goiter 12/02/2014  . Chronic idiopathic gout of foot (Right) 07/06/2014  . Biceps tendonitis (Right) 05/19/2014  . Shoulder pain (Right) 05/19/2014  . Facial weakness 05/05/2014  . Post herpetic neuralgia 05/05/2014  . Chronic dysfunction of left eustachian tube 03/24/2014  . Osteopenia 03/13/2014  . Lumbar Grade 1 Anterolisthesis of L3/4 (43mm) & L4/5 (4-69mm)(w/ Dynamic instability) 03/13/2014  . Family history of colon cancer 12/02/2013  . Dyshidrotic eczema 11/17/2013  . Diastasis recti 04/08/2013  . Facial nerve paresis 07/31/2012  . Blepharospasm 05/08/2012  . 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 Obesity 03/28/2012  . Hemoptysis 12/22/2011  . Neuropathic postherpetic trigeminal neuralgia  12/12/2011  . Allergic rhinitis, mild 10/12/2011  . Disequilibrium syndrome 09/27/2011  . Chronic bronchitis ([place]) 06/05/2011  . Essential hypertension 05/24/2011  . Type 2 diabetes mellitus without complications ([place]) 03/88/8280  . Pulmonary nodules 05/05/2011    Andrey Spearman, MS, OTR/L, [facility] 10/07/19 3:28 PM  [place] MAIN [facility] SERVICES [address] [place], [place], [address] Phone: [phone]   Fax:  [phone]  Name: Susan Callahan MRN: [id number] Date of Birth: 1951-05-11

## 2019-10-09 ENCOUNTER — Other Ambulatory Visit: Payer: Self-pay

## 2019-10-09 ENCOUNTER — Ambulatory Visit: Payer: Medicare Other | Admitting: Occupational Therapy

## 2019-10-09 DIAGNOSIS — I972 Postmastectomy lymphedema syndrome: Secondary | ICD-10-CM | POA: Diagnosis not present

## 2019-10-09 NOTE — Therapy (Signed)
[place] MAIN [facility] SERVICES [address] [place], [place], [address] Phone: [phone]   Fax:  [phone]  Occupational Therapy Treatment  Patient Details  Name: Susan Callahan MRN: [id number] Date of Birth: 08/31/1951 Referring Provider (OT): Humberto Leep, MD   Encounter Date: 10/09/2019  OT End of Session - 10/09/19 1500    Visit Number  80    Number of Visits  72    Date for OT Re-Evaluation  12/18/19    OT Start Time  0210    OT Stop Time  0300    OT Time Calculation (min)  50 min    Activity Tolerance  Patient tolerated treatment well;No increased pain    Behavior During Therapy  WFL for tasks assessed/performed       Past Medical History:  Diagnosis Date  . COPD (chronic obstructive pulmonary disease) ([place])   . Depression 1974  . Diabetes mellitus without complication ([place])   . Hypertension   . Migraine   . 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 LUMBAR PERITONEAL SHUNT    . TYMPANOSTOMY TUBE PLACEMENT      There were no vitals filed for this visit.  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 Pt presents with custom compression sleeve and glove in place. Pt Pt reports 5/10 pain at injection site on L CMC joint. Pt has no additional concerns.                   OT Treatments/Exercises (OP) - 10/09/19 0001      ADLs   ADL Education Given  Yes      Manual Therapy   Manual Therapy  Edema management    Soft tissue mobilization  MFR to R axilla and medial volar UE - no cord release observ    Myofascial Release  for cord release R axilla    Manual Lymphatic Drainage (MLD)  MLD to RUE/RUQ as established using cntalateral AAA    Compression Bandaging  Pt donns custom compression glove and sleeve with modified independence after  session using nylon slippy and friction gloves.             OT Education - 10/09/19 1500    Education Details  Cont Pt edu for LE self care throughout session. Excellent return.    Person(s) Educated  Patient    Methods  Demonstration;Explanation;Handout    Comprehension  Verbalized understanding;Returned demonstration;Need further instruction          OT Long Term Goals - 09/30/19 1600      OT LONG TERM GOAL #1   Title  Pt independent with modified lymphedema precautions and prevention strategies to limit LE progression.    Status  Achieved      OT LONG TERM GOAL #2   Title  Pt will be independent with lymphedema self-care home program and AWS ther ex to improve functional performance of basic and instrumental ADLs by DC.    Status  Achieved      OT LONG TERM GOAL #3   Title  Pt wil report 0/10 pain with RUE functional use ( reaching, lifting, carrying, dressing, dressing, etc) to achieve independent , pain free ADLs performance.    Status  Achieved      OT LONG TERM GOAL #  4   Title  Pt will be able to apply gradient compression wraps using correct techniques independently to control UE swelling and limit LE progression.    Status  Achieved      OT LONG TERM GOAL #5   Title  After skilled trainng Pt will be able to don and doff  appropriate compression garments and/ or devices using correct techniques and assistive devices with extra time  by end of Intensive Phase CDT for optimal LE management to limit progression over time.    Status  Achieved      OT LONG TERM GOAL #6   Title  Due to onset of RUE swelling concentrated primarily in R hand during manual therapy for cording  Pt will achieve 5%, or greater RUE  limb volume reductions during Intensive Phase CDT to  improve ADLs performances, to reduce infection risk, and to limit LE progression.    Status  Achieved            Plan - 10/09/19 1502    Clinical Impression Statement  Continued with RUE/RUQ MLD and  MFR as established to reduce arm and hand swelling and to mobilize azillary cords   to improve pain free AROM at end ranges.  Able to palpate readily visible cording at R antecubital fossa. Pt denies pain with manual therapy. Swelling persists at dorsal hand over MPS  at thumb, index and 3rd digit. Lymphedema is typically more generalized   in distribution. Skin over swollen joints is not warm or red to the touch suggesting arthritis. Pt has full, pain free AROM of hand. Encouraged Pt to resume wearing remade custom glove as this garment provides best fit for optimal compression. Con as per POC.    OT Occupational Profile and History  Comprehensive Assessment- Review of records and extensive additional review of physical, cognitive, psychosocial history related to current functional performance    Occupational performance deficits (Please refer to evaluation for details):  ADL's;IADL's;Social Participation;Leisure;Rest and Sleep;Work    Marketing executive / Function / Physical Skills  ADL;Decreased knowledge of precautions;ROM;UE functional use;Decreased knowledge of use of DME;Edema;Skin integrity;Pain;IADL    Rehab Potential  Good    Clinical Decision Making  Several treatment options, min-mod task modification necessary    Comorbidities Affecting Occupational Performance:  Presence of comorbidities impacting occupational performance    Comorbidities impacting occupational performance description:  see SUBJUECTIVE    Modification or Assistance to Complete Evaluation   Min-Moderate modification of tasks or assist with assess necessary to complete eval    OT Frequency  2x / week    OT Duration  12 weeks    OT Treatment/Interventions  Self-care/ADL training;Therapeutic exercise;Manual lymph drainage;Patient/family education;Therapeutic activities;DME and/or AE instruction;Manual Therapy;Other (comment)   consider fitting with prophylactic cmopression arm sleeve   Plan  BUE AROM ther ex to reduce axillary  cording- 2 sets of 10 2-3 x daily    Consulted and Agree with Plan of Care  Patient       Patient will benefit from skilled therapeutic intervention in order to improve the following deficits and impairments:   Body Structure / Function / Physical Skills: ADL, Decreased knowledge of precautions, ROM, UE functional use, Decreased knowledge of use of DME, Edema, Skin integrity, Pain, IADL       Visit Diagnosis: Postmastectomy lymphedema syndrome    Problem List Patient Active Problem List   Diagnosis Date Noted  . Edema 04/16/2019  . Postoperative wound infection 03/13/2019  . Seroma of breast  03/11/2019  . Age-related osteoporosis without current pathological fracture 02/27/2019  . Malignant neoplasm of central portion of right breast in female, estrogen receptor positive ([place]) 02/12/2019  . Genetic testing 01/31/2019  . Malignant neoplasm of upper-inner quadrant of right breast in female, estrogen receptor positive ([place]) 01/15/2019  . Long term current use of opiate analgesic 12/02/2018  . Tympanic membrane central perforation, left 11/04/2018  . DDD (degenerative disc disease), lumbosacral 10/10/2018  . Abnormal MRI, lumbar spine (08/29/2018) 10/10/2018  . 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 Lumbar nerve root impingement (Bilateral: L4 & L5) (L>R: L5) 10/10/2018  . Lumbar Spinal instability of L4-5 10/10/2018  . Lumbar facet hypertrophy 10/10/2018  . COPD (chronic obstructive pulmonary disease) with emphysema ([place]) 08/28/2018  . Elevated rheumatoid factor 08/13/2018  . Elevated C-reactive protein (CRP) 08/12/2018  . Elevated sed rate 08/12/2018  . Lumbar facet syndrome (Bilateral) 08/12/2018  . Back pain with left-sided radiculopathy 08/12/2018  . DDD (degenerative disc disease), lumbar 08/12/2018  . Chronic lower extremity pain (Primary Area of Pain) (Bilateral) (L>R) 07/23/2018  . Chronic low back pain (Secondary Area of  Pain) (Bilateral) (L>R) w/ sciatica (Bilateral) 07/23/2018  . Chronic pain syndrome 07/23/2018  . Pharmacologic therapy 07/23/2018  . Disorder of skeletal system 07/23/2018  . Problems influencing health status 07/23/2018  . Chronic sacroiliac joint pain [facility] Area of Pain) (Bilateral) (L>R) 07/23/2018  . Status post lumboperitoneal shunt placement 02/26/2018  . 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 Tympanic membrane perforation 05/30/2016  . PMB (postmenopausal bleeding) 04/26/2016  . Rash 12/27/2015  . 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 Plantar fasciitis of foot (Left) 05/26/2015  . Spondylosis of lumbar region without myelopathy or radiculopathy 05/26/2015  . Lumbar foraminal stenosis (L3-4, L4-5, L5-S1) (L>R) 05/26/2015  . Spinal stenosis of lumbar region with neurogenic claudication 05/26/2015  . Encounter for screening mammogram for malignant neoplasm of breast 12/02/2014  . Goiter 12/02/2014  . Chronic idiopathic gout of foot (Right) 07/06/2014  . Biceps tendonitis (Right) 05/19/2014  . Shoulder pain (Right) 05/19/2014  . Facial weakness 05/05/2014  . Post herpetic neuralgia 05/05/2014  . Chronic dysfunction of left eustachian tube 03/24/2014  . Osteopenia 03/13/2014  . Lumbar Grade 1 Anterolisthesis of L3/4 (55mm) & L4/5 (4-28mm)(w/ Dynamic instability) 03/13/2014  . Family history of colon cancer 12/02/2013  . Dyshidrotic eczema 11/17/2013  . Diastasis recti 04/08/2013  . Facial nerve paresis 07/31/2012  . Blepharospasm 05/08/2012  . 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 Obesity 03/28/2012  . Hemoptysis 12/22/2011  . Neuropathic postherpetic trigeminal neuralgia 12/12/2011  . Allergic rhinitis, mild 10/12/2011  . Disequilibrium syndrome 09/27/2011  . Chronic bronchitis ([place]) 06/05/2011  . Essential hypertension 05/24/2011  . Type 2  diabetes mellitus without complications ([place]) 54/65/0354  . Pulmonary nodules 05/05/2011   Andrey Spearman, MS, OTR/L, [facility] 10/09/19 3:03 PM  [place] MAIN [facility] SERVICES [address] [place], [place], [address] Phone: [phone]   Fax:  [phone]  Name: Susan Callahan MRN: [id number] Date of Birth: 1951-05-30

## 2019-10-15 ENCOUNTER — Encounter: Payer: Medicare Other | Admitting: Occupational Therapy

## 2019-10-22 ENCOUNTER — Ambulatory Visit: Payer: Medicare Other | Admitting: Occupational Therapy

## 2019-10-22 ENCOUNTER — Ambulatory Visit: Admit: 2019-10-22 | Payer: Medicare Other | Admitting: Internal Medicine

## 2019-10-22 SURGERY — COLONOSCOPY WITH PROPOFOL
Anesthesia: General

## 2019-10-27 IMAGING — MR MR LUMBAR SPINE W/O CM
5 series · 30 of 48 positions shown · non-contrast
Comparison: Plain films 07/23/2018.

CLINICAL DATA: Low back pain.  BILATERAL hip and leg pain.

EXAM:
MRI LUMBAR SPINE WITHOUT CONTRAST
TECHNIQUE: Multiplanar, multisequence MR imaging of the lumbar spine was
performed. No intravenous contrast was administered.

[Series 5: T2 · sagittal · 4.0mm · 0.81mm/px · 6 of 17 slices shown (1 of 2)]
[im 1/17]
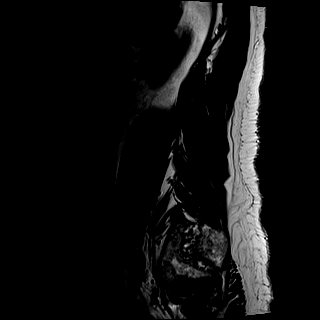
[im 4/17]
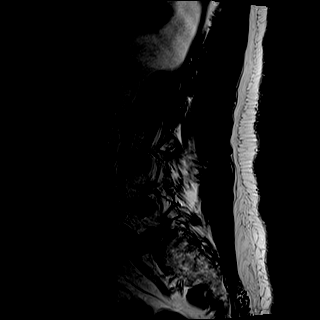
[im 7/17]
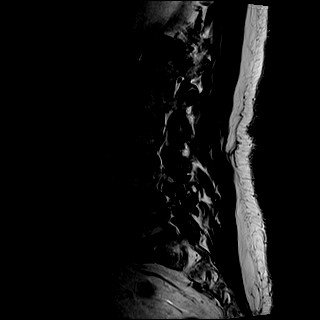
[im 10/17]
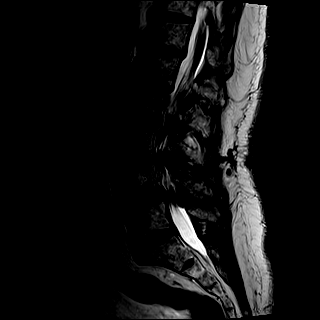
[im 13/17]
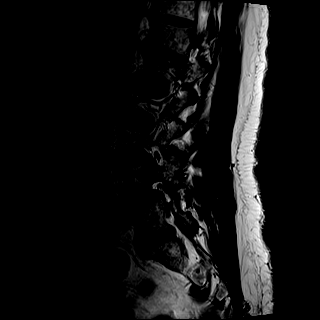
[im 17/17]
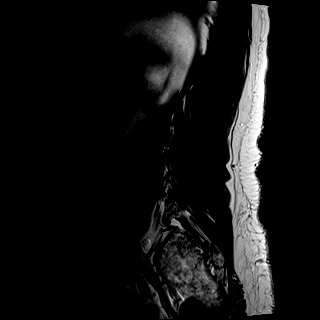

[Series 6: T1 · sagittal · 4.0mm · 0.81mm/px · 7 of 17 slices shown (1 of 2)]
[im 1/17]
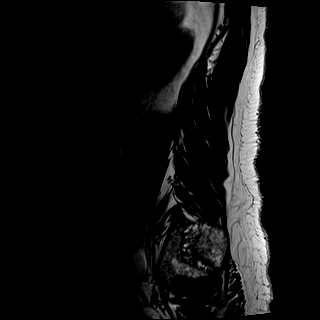
[im 3/17]
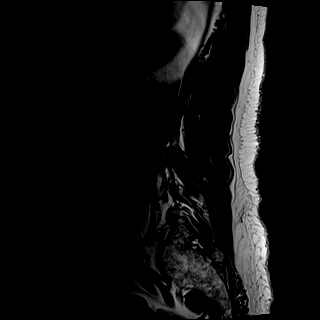
[im 6/17]
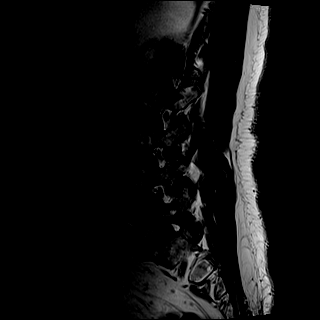
[im 9/17]
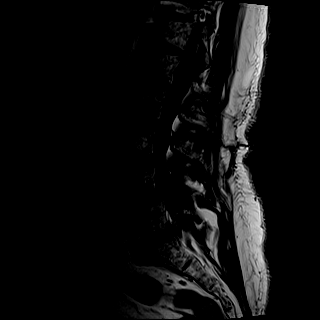
[im 11/17]
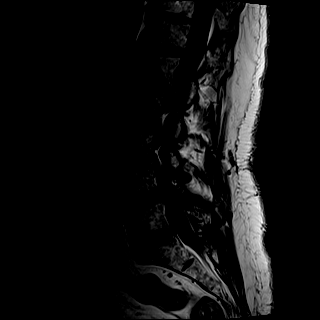
[im 14/17]
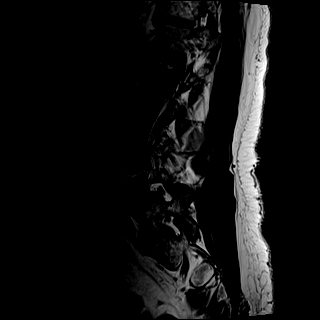
[im 17/17]
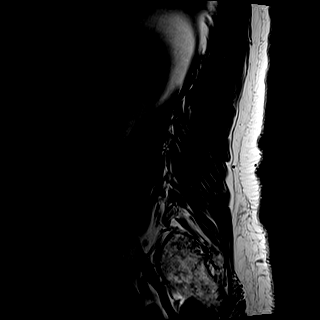

[Series 7: STIR · sagittal · 4.0mm · 0.41mm/px · 1 of 17 slices shown]
[im 1/17]
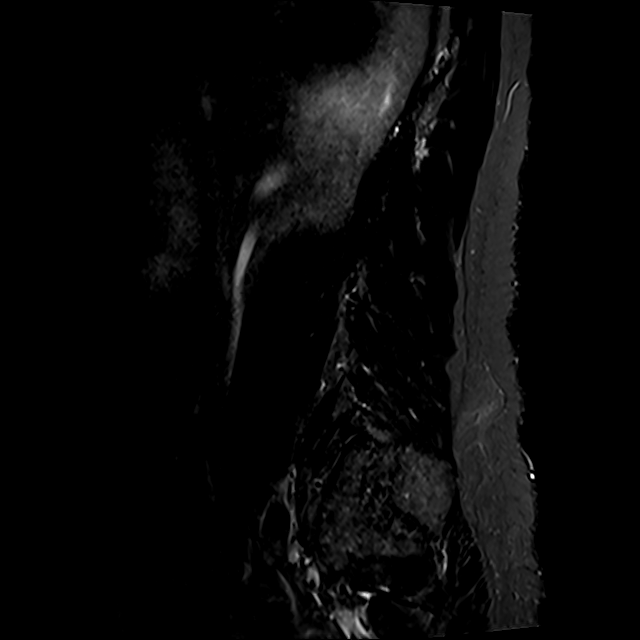

[Series 8: T2 · axial · 4.0mm · 0.78mm/px · z∈[-51,+171]mm · 8 of 33 slices shown (2 of 2)]
[im 1/33]
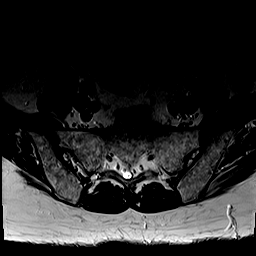
[im 5/33]
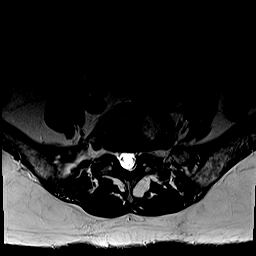
[im 10/33]
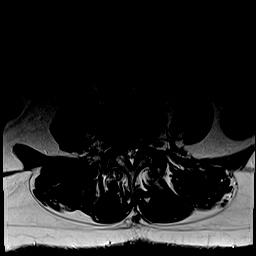
[im 15/33]
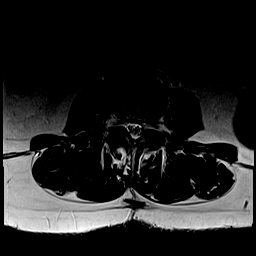
[im 18/33]
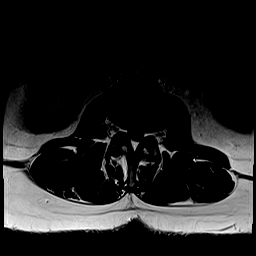
[im 23/33]
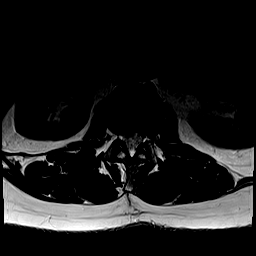
[im 28/33]
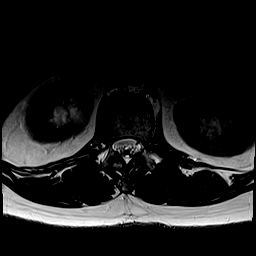
[im 33/33]
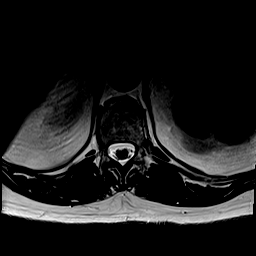

[Series 9: T1 · axial · 4.0mm · 0.39mm/px · z∈[-51,+171]mm · 8 of 33 slices shown (2 of 2)]
[im 1/33]
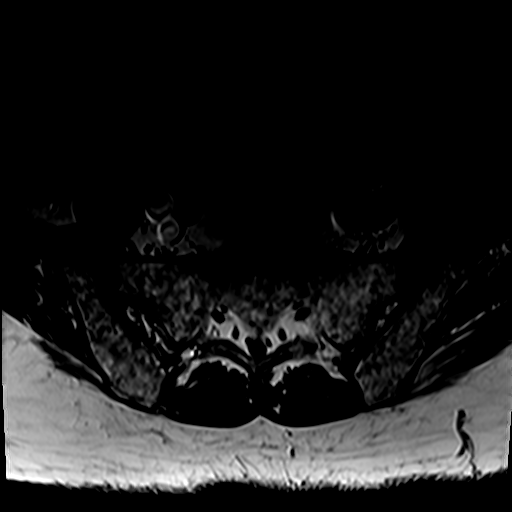
[im 5/33]
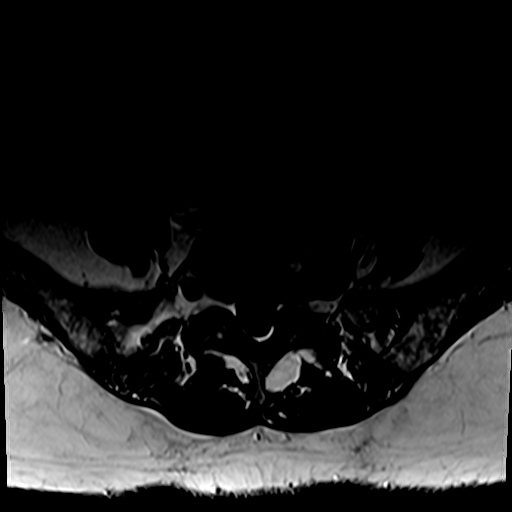
[im 10/33]
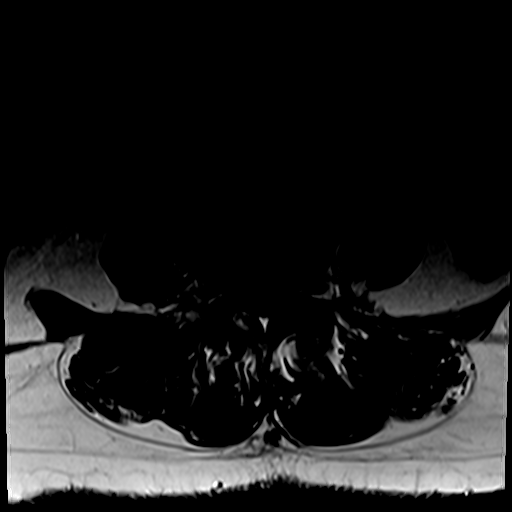
[im 15/33]
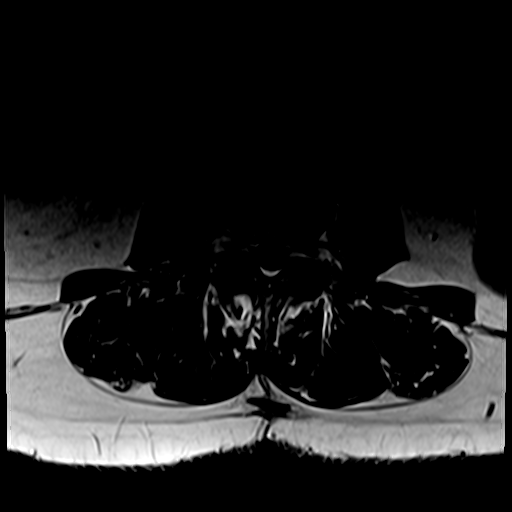
[im 18/33]
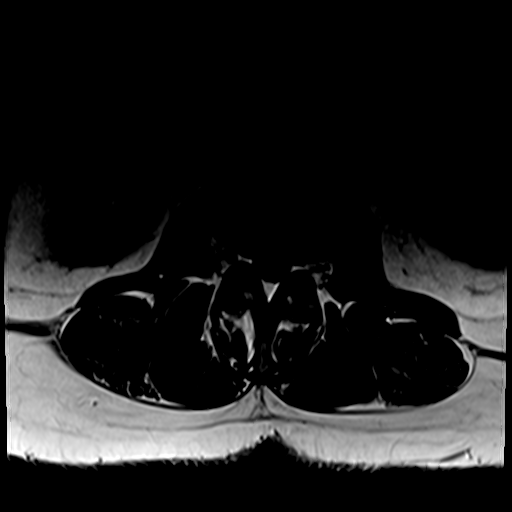
[im 23/33]
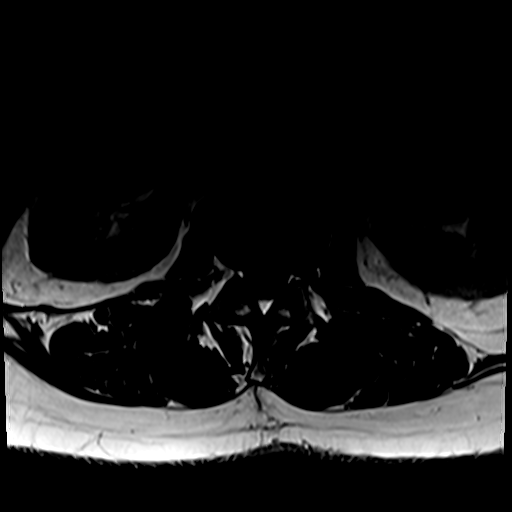
[im 28/33]
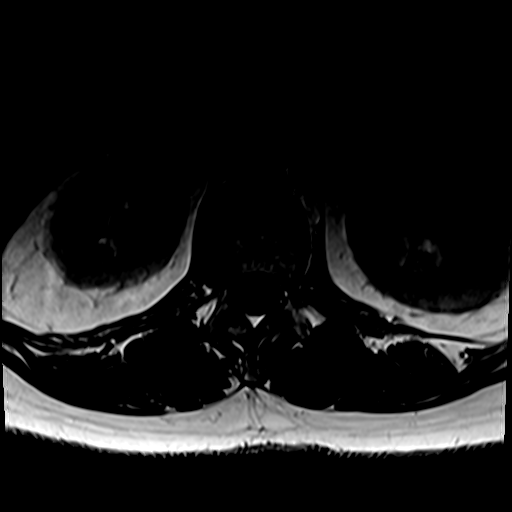
[im 33/33]
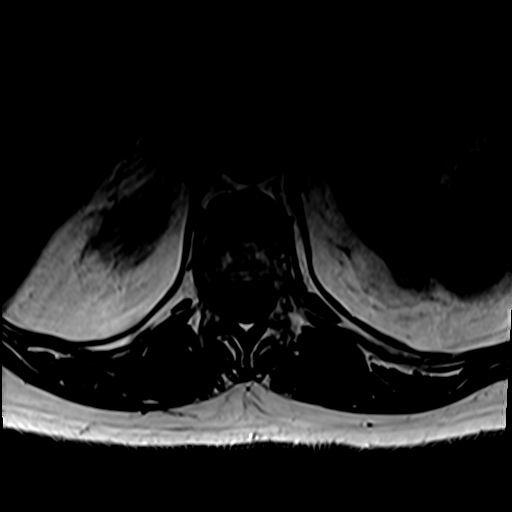

[30 of 48 positions shown; findings below may reference images not displayed]

FINDINGS: Segmentation:  Standard.

Alignment: Slight degenerative levoconvex scoliosis mid lumbar
region. Grade 1 anterolisthesis L3-4 and L4-5.

Vertebrae: No worrisome osseous lesion. No fracture, evidence of
diskitis, or intrinsic bone abnormality.

Conus medullaris and cauda equina: Conus extends to the L1 level.
Conus and cauda equina appear normal. A lumboperitoneal shunt
appears to enter the spinal canal at approximately L2-3, with
catheter directed superiorly on the LEFT.

Paraspinal and other soft tissues: Renal cystic disease incompletely
evaluated. If further investigation desired, consider sonography.

Disc levels:

L1-L2: Annular bulge. RIGHT greater than LEFT facet arthropathy. No
impingement.

L2-L3: Annular bulge. Short pedicles. Posterior element hypertrophy.
Borderline stenosis. No L2 or L3 neural impingement.

L3-L4: 2 mm anterolisthesis is facet mediated. Short pedicles are
superimposed. Annular bulge is observed. Posterior element
hypertrophy is noted. Moderate to severe stenosis. BILATERAL L4
neural impingement is likely. Foraminal zone narrowing is present
without definite compression of the L3 nerve roots.

L4-L5: 4 mm anterolisthesis is facet mediated. Short pedicles are
noted. There is posterior element hypertrophy affecting facets and
ligamentum flavum. Uncovering of the disc with central
protrusion/extrusion, cephalad migrated. There is severe, near
critical stenosis. BILATERAL L5 neural impingement is noted, worse
on the LEFT. There is mild foraminal narrowing not clearly
compressive.

L5-S1: Disc space narrowing on the LEFT is compensatory. Annular
bulge. Osseous spurring posterior element hypertrophy. No
subarticular zone narrowing. LEFT-sided foraminal narrowing is
severe, affecting the LEFT L5 nerve root. Asymmetric facet
arthropathy is contributory.

Correlating with patient's lumbar spine radiographs, there is
significant dynamic instability at L4-5. As measured on standing
flexion, 10 mm of anterolisthesis develops at L4-5.
IMPRESSION: Severe, near critical spinal stenosis at L4-5 related to 4 mm slip,
short pedicles, central protrusion/extrusion, and posterior element
hypertrophy. BILATERAL L5 neural impingement, worse on the LEFT.
Impingement at this level is almost certainly accentuated by
standing, particularly in flexion, based on recent lumbar
radiographs.

Moderate to severe stenosis at L3-4, secondary to 2 mm
anterolisthesis, posterior element hypertrophy and annular bulge.
BILATERAL L4 neural impingement is likely.

Asymmetric foraminal narrowing L5-S1 is multifactorial. Correlate
clinically for LEFT L5 radicular symptoms.

Lumboperitoneal shunt has been placed in the subarachnoid space
cephalad to the levels of most severe spinal canal narrowing.

## 2019-10-29 ENCOUNTER — Other Ambulatory Visit: Payer: Self-pay

## 2019-10-29 ENCOUNTER — Ambulatory Visit: Payer: Medicare Other | Attending: Rehabilitation | Admitting: Occupational Therapy

## 2019-10-29 DIAGNOSIS — I972 Postmastectomy lymphedema syndrome: Secondary | ICD-10-CM | POA: Insufficient documentation

## 2019-10-29 NOTE — Therapy (Signed)
[place] [facility]IN [facility] SERVICES [address] [place], [place], [address] Phone: [phone]   Fax:  [phone]  Occupational Therapy Treatment  Patient Details  Name: Susan Callahan MRN: [id number] Date of Birth: [birth year] Referring Provider (OT): Humberto Leep, MD   Encounter Date: 10/29/2019  OT End of Session - 10/29/19 1724    Visit Number  15    Number of Visits  72    Date for OT Re-Evaluation  12/18/19    OT Start Time  0110    OT Stop Time  0210    OT Time Calculation (min)  60 min    Activity Tolerance  Patient tolerated treatment well;No increased pain    Behavior During Therapy  WFL for tasks assessed/performed       Past Medical History:  Diagnosis Date  . COPD (chronic obstructive pulmonary disease) ([place])   . Depression 1974  . Diabetes mellitus without complication ([place])   . Hypertension   . Migraine   . 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 LUMBAR PERITONEAL SHUNT    . TYMPANOSTOMY TUBE PLACEMENT      There were no vitals filed for this visit.  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 Pt presents with custom L ulnar gutter splint in place due to wrist and thump pain. Pt tolerated MLD and fibrosis techiques       w emphasis on dorsal MPs without difficulty. She appled custom ompression garments after manual therapy independently. Pt reports virtual session with [employer] rep for pump garment fitting revealed garments are incorrect size and need replacement,. This process is in thwe works. Cont OT 1 x weekly until Pt is able to utilize device competently , then decrease to follow along frequency,, TBD.                   OT Treatments/Exercises (OP) - 10/29/19 0001      ADLs   ADL Education Given  Yes      Manual  Therapy   Manual Therapy  Edema management    Manual Lymphatic Drainage (MLD)  MLD to RUE/RUQ as established using cntalateral AAA    Compression Bandaging  Pt donns custom compression glove and sleeve with modified independence after session using nylon slippy and friction gloves.             OT Education - 10/29/19 1724    Education Details  Cont Pt edu for LE self care throughout session. Excellent return.    Person(s) Educated  Patient    Methods  Demonstration;Explanation;Handout    Comprehension  Verbalized understanding;Returned demonstration;Need further instruction          OT Long Term Goals - 09/30/19 1600      OT LONG TERM GOAL #1   Title  Pt independent with modified lymphedema precautions and prevention strategies to limit LE progression.    Status  Achieved      OT LONG TERM GOAL #2   Title  Pt will be independent with lymphedema self-care home program and AWS ther ex to improve functional performance of basic and instrumental ADLs by DC.    Status  Achieved      OT LONG TERM GOAL #3   Title  Pt wil report 0/10 pain with RUE  functional use ( reaching, lifting, carrying, dressing, dressing, etc) to achieve independent , pain free ADLs performance.    Status  Achieved      OT LONG TERM GOAL #4   Title  Pt will be able to apply gradient compression wraps using correct techniques independently to control UE swelling and limit LE progression.    Status  Achieved      OT LONG TERM GOAL #5   Title  After skilled trainng Pt will be able to don and doff  appropriate compression garments and/ or devices using correct techniques and assistive devices with extra time  by end of Intensive Phase CDT for optimal LE management to limit progression over time.    Status  Achieved      OT LONG TERM GOAL #6   Title  Due to onset of RUE swelling concentrated primarily in R hand during manual therapy for cording  Pt will achieve 5%, or greater RUE  limb volume reductions  during Intensive Phase CDT to  improve ADLs performances, to reduce infection risk, and to limit LE progression.    Status  Achieved            Plan - 10/29/19 1725    Clinical Impression Statement  Provided MLD and MFR to RUE/RUQ as established. Able to palpate readily visible cording at R antecubital fossa. Pt denies pain with manual therapy. Compression sleeve relieves cording pain by report. Pt approved for donated refurbished compression pump, which is to be delivered this Friday. Pt will attampt set up on her own suing DVD, and cqall trainer in [employer]. Cont as per POC decreasing OT frequency from 2 x week to 1 x weekly in Feb.    OT Occupational Profile and History  Comprehensive Assessment- Review of records and extensive additional review of physical, cognitive, psychosocial history related to current functional performance    Occupational performance deficits (Please refer to evaluation for details):  ADL's;IADL's;Social Participation;Leisure;Rest and Sleep;Work    Marketing executive / Function / Physical Skills  ADL;Decreased knowledge of precautions;ROM;UE functional use;Decreased knowledge of use of DME;Edema;Skin integrity;Pain;IADL    Rehab Potential  Good    Clinical Decision Making  Several treatment options, min-mod task modification necessary    Comorbidities Affecting Occupational Performance:  Presence of comorbidities impacting occupational performance    Comorbidities impacting occupational performance description:  see SUBJUECTIVE    Modification or Assistance to Complete Evaluation   Min-Moderate modification of tasks or assist with assess necessary to complete eval    OT Frequency  2x / week    OT Duration  12 weeks    OT Treatment/Interventions  Self-care/ADL training;Therapeutic exercise;Manual lymph drainage;Patient/family education;Therapeutic activities;DME and/or AE instruction;Manual Therapy;Other (comment)   consider fitting with prophylactic cmopression arm sleeve    Plan  BUE AROM ther ex to reduce axillary cording- 2 sets of 10 2-3 x daily    Consulted and Agree with Plan of Care  Patient       Patient will benefit from skilled therapeutic intervention in order to improve the following deficits and impairments:   Body Structure / Function / Physical Skills: ADL, Decreased knowledge of precautions, ROM, UE functional use, Decreased knowledge of use of DME, Edema, Skin integrity, Pain, IADL       Visit Diagnosis: Postmastectomy lymphedema syndrome    Problem List Patient Active Problem List   Diagnosis Date Noted  . Edema 04/16/2019  . Postoperative wound infection 03/13/2019  . Seroma of breast 03/11/2019  .  Age-related osteoporosis without current pathological fracture 02/27/2019  . Malignant neoplasm of central portion of right breast in female, estrogen receptor positive ([place]) 02/12/2019  . Genetic testing 01/31/2019  . Malignant neoplasm of upper-inner quadrant of right breast in female, estrogen receptor positive ([place]) 01/15/2019  . Long term current use of opiate analgesic 12/02/2018  . Tympanic membrane central perforation, left 11/04/2018  . DDD (degenerative disc disease), lumbosacral 10/10/2018  . Abnormal MRI, lumbar spine (08/29/2018) 10/10/2018  . 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 Lumbar nerve root impingement (Bilateral: L4 & L5) (L>R: L5) 10/10/2018  . Lumbar Spinal instability of L4-5 10/10/2018  . Lumbar facet hypertrophy 10/10/2018  . COPD (chronic obstructive pulmonary disease) with emphysema ([place]) 08/28/2018  . Elevated rheumatoid factor 08/13/2018  . Elevated C-reactive protein (CRP) 08/12/2018  . Elevated sed rate 08/12/2018  . Lumbar facet syndrome (Bilateral) 08/12/2018  . Back pain with left-sided radiculopathy 08/12/2018  . DDD (degenerative disc disease), lumbar 08/12/2018  . Chronic lower extremity pain (Primary Area of Pain) (Bilateral) (L>R) 07/23/2018  .  Chronic low back pain (Secondary Area of Pain) (Bilateral) (L>R) w/ sciatica (Bilateral) 07/23/2018  . Chronic pain syndrome 07/23/2018  . Pharmacologic therapy 07/23/2018  . Disorder of skeletal system 07/23/2018  . Problems influencing health status 07/23/2018  . Chronic sacroiliac joint pain [facility] Area of Pain) (Bilateral) (L>R) 07/23/2018  . Status post lumboperitoneal shunt placement 02/26/2018  . 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 Tympanic membrane perforation 05/30/2016  . PMB (postmenopausal bleeding) 04/26/2016  . Rash 12/27/2015  . 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 Plantar fasciitis of foot (Left) 05/26/2015  . Spondylosis of lumbar region without myelopathy or radiculopathy 05/26/2015  . Lumbar foraminal stenosis (L3-4, L4-5, L5-S1) (L>R) 05/26/2015  . Spinal stenosis of lumbar region with neurogenic claudication 05/26/2015  . Encounter for screening mammogram for malignant neoplasm of breast 12/02/2014  . Goiter 12/02/2014  . Chronic idiopathic gout of foot (Right) 07/06/2014  . Biceps tendonitis (Right) 05/19/2014  . Shoulder pain (Right) 05/19/2014  . Facial weakness 05/05/2014  . Post herpetic neuralgia 05/05/2014  . Chronic dysfunction of left eustachian tube 03/24/2014  . Osteopenia 03/13/2014  . Lumbar Grade 1 Anterolisthesis of L3/4 (85mm) & L4/5 (4-81mm)(w/ Dynamic instability) 03/13/2014  . Family history of colon cancer 12/02/2013  . Dyshidrotic eczema 11/17/2013  . Diastasis recti 04/08/2013  . Facial nerve paresis 07/31/2012  . Blepharospasm 05/08/2012  . 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 Obesity 03/28/2012  . Hemoptysis 12/22/2011  . Neuropathic postherpetic trigeminal neuralgia 12/12/2011  . Allergic rhinitis, mild 10/12/2011  . Disequilibrium syndrome 09/27/2011  . Chronic bronchitis ([place]) 06/05/2011  . Essential  hypertension 05/24/2011  . Type 2 diabetes mellitus without complications ([place]) 44/81/8563  . Pulmonary nodules 05/05/2011    Andrey Spearman, MS, OTR/L, [facility] 10/29/19 5:26 PM  [place] [facility] SERVICES [address] [place], [place], [address] Phone: [phone]   Fax:  [phone]  Name: Susan Callahan MRN: [id number] Date of Birth: 1951/07/12

## 2019-11-05 ENCOUNTER — Ambulatory Visit: Payer: Medicare Other | Admitting: Occupational Therapy

## 2019-11-05 ENCOUNTER — Other Ambulatory Visit: Payer: Self-pay

## 2019-11-05 DIAGNOSIS — I972 Postmastectomy lymphedema syndrome: Secondary | ICD-10-CM | POA: Diagnosis not present

## 2019-11-05 NOTE — Therapy (Signed)
[place] MAIN [facility] SERVICES [address] [place], [place], [address] Phone: [phone]   Fax:  [phone]  Occupational Therapy Treatment  Patient Details  Name: Susan Callahan MRN: [id number] Date of Birth: 02-08-1951 Referring Provider (OT): Humberto Leep, MD   Encounter Date: 11/05/2019  OT End of Session - 11/05/19 1640    Visit Number  65    Number of Visits  72    Date for OT Re-Evaluation  12/18/19    OT Start Time  0100    OT Stop Time  0210    OT Time Calculation (min)  70 min    Activity Tolerance  Patient tolerated treatment well;No increased pain    Behavior During Therapy  WFL for tasks assessed/performed       Past Medical History:  Diagnosis Date  . COPD (chronic obstructive pulmonary disease) ([place])   . Depression 1974  . Diabetes mellitus without complication ([place])   . Hypertension   . Migraine   . 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 LUMBAR PERITONEAL SHUNT    . TYMPANOSTOMY TUBE PLACEMENT      There were no vitals filed for this visit.  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 Pt was last seen for OT/ LE care ~ 2 1/2 weeks ago. Pt reports she received correct garments for use with Flexitouch and immediately  was able to use the device. Pt  reports symptoms have not worsened during longer interval between visits.                   OT Treatments/Exercises (OP) - 11/05/19 0001      ADLs   ADL Education Given  Yes      Manual Therapy   Manual Therapy  Edema management    Manual Lymphatic Drainage (MLD)  MLD to RUE/RUQ as established using cntalateral AAA    Compression Bandaging  Pt donns custom compression glove and sleeve with modified independence after session using nylon slippy and friction gloves.              OT Education - 11/05/19 1640    Education Details  Pt edu for lymphedema pump routine- 1 x daily    Person(s) Educated  Patient    Methods  Demonstration;Explanation;Handout    Comprehension  Verbalized understanding;Returned demonstration;Need further instruction          OT Long Term Goals - 11/05/19 1600      OT LONG TERM GOAL #1   Title  Pt independent with modified lymphedema precautions and prevention strategies to limit LE progression.    Status  Achieved      OT LONG TERM GOAL #2   Title  Pt will be independent with lymphedema self-care home program and AWS ther ex to improve functional performance of basic and instrumental ADLs by DC.    Status  Achieved      OT LONG TERM GOAL #3   Title  Pt wil report 0/10 pain with RUE functional use ( reaching, lifting, carrying, dressing, dressing, etc) to achieve independent , pain free ADLs performance.    Status  Achieved      OT LONG TERM GOAL #4   Title  Pt will be able to apply gradient compression wraps using correct  techniques independently to control UE swelling and limit LE progression.    Status  Achieved      OT LONG TERM GOAL #5   Title  After skilled trainng Pt will be able to don and doff  appropriate compression garments and/ or devices using correct techniques and assistive devices with extra time  by end of Intensive Phase CDT for optimal LE management to limit progression over time.    Status  Achieved      OT LONG TERM GOAL #6   Title  Due to onset of RUE swelling concentrated primarily in R hand during manual therapy for cording  Pt will achieve 5%, or greater RUE  limb volume reductions during Intensive Phase CDT to  improve ADLs performances, to reduce infection risk, and to limit LE progression.    Status  Achieved            Plan - 11/05/19 1642    Clinical Impression Statement  Pt tolerated MLD and MFR to RUE/ RUQ to decongest lymphedema in hand and to reduce painful effects of  axillary cording in axilla and forarm. Although chordng remains palpable in antecubial foss and axila, Pt has regained full ARROM of RUE / RUQ without guarding. Pt reports end ranges are still light and uncomfortable, but she no longer limits functional movement due to anticipation of pain. Edema in UE has resolved except at MPs of 2nd and 3rd   digits. Pt has appropriate compression garments, advanced sequential lymphatic device, "pump", and remains diligent with all LE self care components. Pt has met all clinical goals. She agrees with plan to return for 1 month f/u, and if limb volumes and ROM remain stable and WNL, we will DC OT for LE care. Cont as per POC.    OT Occupational Profile and History  Comprehensive Assessment- Review of records and extensive additional review of physical, cognitive, psychosocial history related to current functional performance    Occupational performance deficits (Please refer to evaluation for details):  ADL's;IADL's;Social Participation;Leisure;Rest and Sleep;Work    Marketing executive / Function / Physical Skills  ADL;Decreased knowledge of precautions;ROM;UE functional use;Decreased knowledge of use of DME;Edema;Skin integrity;Pain;IADL    Rehab Potential  Good    Clinical Decision Making  Several treatment options, min-mod task modification necessary    Comorbidities Affecting Occupational Performance:  Presence of comorbidities impacting occupational performance    Comorbidities impacting occupational performance description:  see SUBJUECTIVE    Modification or Assistance to Complete Evaluation   Min-Moderate modification of tasks or assist with assess necessary to complete eval    OT Frequency  2x / week    OT Duration  12 weeks    OT Treatment/Interventions  Self-care/ADL training;Therapeutic exercise;Manual lymph drainage;Patient/family education;Therapeutic activities;DME and/or AE instruction;Manual Therapy;Other (comment)   consider fitting with prophylactic  cmopression arm sleeve   Plan  BUE AROM ther ex to reduce axillary cording- 2 sets of 10 2-3 x daily    Consulted and Agree with Plan of Care  Patient       Patient will benefit from skilled therapeutic intervention in order to improve the following deficits and impairments:   Body Structure / Function / Physical Skills: ADL, Decreased knowledge of precautions, ROM, UE functional use, Decreased knowledge of use of DME, Edema, Skin integrity, Pain, IADL       Visit Diagnosis: Postmastectomy lymphedema syndrome    Problem List Patient Active Problem List   Diagnosis Date Noted  . Edema 04/16/2019  .  Postoperative wound infection 03/13/2019  . Seroma of breast 03/11/2019  . Age-related osteoporosis without current pathological fracture 02/27/2019  . Malignant neoplasm of central portion of right breast in female, estrogen receptor positive ([place]) 02/12/2019  . Genetic testing 01/31/2019  . Malignant neoplasm of upper-inner quadrant of right breast in female, estrogen receptor positive ([place]) 01/15/2019  . Long term current use of opiate analgesic 12/02/2018  . Tympanic membrane central perforation, left 11/04/2018  . DDD (degenerative disc disease), lumbosacral 10/10/2018  . Abnormal MRI, lumbar spine (08/29/2018) 10/10/2018  . 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 Lumbar nerve root impingement (Bilateral: L4 & L5) (L>R: L5) 10/10/2018  . Lumbar Spinal instability of L4-5 10/10/2018  . Lumbar facet hypertrophy 10/10/2018  . COPD (chronic obstructive pulmonary disease) with emphysema ([place]) 08/28/2018  . Elevated rheumatoid factor 08/13/2018  . Elevated C-reactive protein (CRP) 08/12/2018  . Elevated sed rate 08/12/2018  . Lumbar facet syndrome (Bilateral) 08/12/2018  . Back pain with left-sided radiculopathy 08/12/2018  . DDD (degenerative disc disease), lumbar 08/12/2018  . Chronic lower extremity pain (Primary Area of Pain)  (Bilateral) (L>R) 07/23/2018  . Chronic low back pain (Secondary Area of Pain) (Bilateral) (L>R) w/ sciatica (Bilateral) 07/23/2018  . Chronic pain syndrome 07/23/2018  . Pharmacologic therapy 07/23/2018  . Disorder of skeletal system 07/23/2018  . Problems influencing health status 07/23/2018  . Chronic sacroiliac joint pain [facility] Area of Pain) (Bilateral) (L>R) 07/23/2018  . Status post lumboperitoneal shunt placement 02/26/2018  . 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 Tympanic membrane perforation 05/30/2016  . PMB (postmenopausal bleeding) 04/26/2016  . Rash 12/27/2015  . 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 Plantar fasciitis of foot (Left) 05/26/2015  . Spondylosis of lumbar region without myelopathy or radiculopathy 05/26/2015  . Lumbar foraminal stenosis (L3-4, L4-5, L5-S1) (L>R) 05/26/2015  . Spinal stenosis of lumbar region with neurogenic claudication 05/26/2015  . Encounter for screening mammogram for malignant neoplasm of breast 12/02/2014  . Goiter 12/02/2014  . Chronic idiopathic gout of foot (Right) 07/06/2014  . Biceps tendonitis (Right) 05/19/2014  . Shoulder pain (Right) 05/19/2014  . Facial weakness 05/05/2014  . Post herpetic neuralgia 05/05/2014  . Chronic dysfunction of left eustachian tube 03/24/2014  . Osteopenia 03/13/2014  . Lumbar Grade 1 Anterolisthesis of L3/4 (47m) & L4/5 (4-11m(w/ Dynamic instability) 03/13/2014  . Family history of colon cancer 12/02/2013  . Dyshidrotic eczema 11/17/2013  . Diastasis recti 04/08/2013  . Facial nerve paresis 07/31/2012  . Blepharospasm 05/08/2012  . 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 Obesity 03/28/2012  . Hemoptysis 12/22/2011  . Neuropathic postherpetic trigeminal neuralgia 12/12/2011  . Allergic rhinitis, mild 10/12/2011  . Disequilibrium syndrome 09/27/2011  . Chronic bronchitis  (HCL[place]ville09/24/2012  . Essential hypertension 05/24/2011  . Type 2 diabetes mellitus without complications (HCTexhoma0979/[id number]. Pulmonary nodules 05/05/2011    ThAndrey SpearmanMS, OTR/L, CLCapital Endoscopy LLC2/24/21 4:47 PM  [place] REMonte[facility] HospitalERVICES [address]hone: [phone] Fax:  [phone]: Susan MasellaRN: [id number] of Birth: 02/1951/08/22

## 2019-11-05 NOTE — Patient Instructions (Signed)
Lymphedema Precautions   Dopler study required prior to participating in Complete Decongestive Therapy (CDT) for lymphedema (LE) care  to rule out DVT   If you experience atypical shortness of breath, or notice any signs /symptoms of skin infection (aka cellulitis) remove all compression wraps/ garments, discontinue manual lymphatic drainage (MLD), and report symptoms to your physician immediately. Discontinue MLD and compression for 72 hours after you take your first oral antibiotic so not to spread the infection.   Lymphedema Self- Care Instructions  1. EXERCISE: Perform lymphatic pumping there ex 2 x a day. While wearing your compression wraps or garments. Perform 10 reps of each exercise bilaterally and be sure to perform them in order. Don;t skip around!  OMIT PARTIAL SIT UP  2. MLD: Perform simple self-Manual Lymphatic Drainage (MLD) at least once a day as directed.  3. WRAPS: Compression wraps are to be worn 23 hrs/ 7 days/wk during Intensive Phase of Complete Decongestive Therapy (CDT).Building tolerance may take time and practice, so don't get discouraged. If bandages begin to feel tight during periods of inactivity and/or during the night, try performing your exercises to loosen them.   4. GARMENTS: During Management Phase CDT your compression garments are to be worn during waking hours when active. Do NOT sleep in your garments!!   5. PUT YOUR FEET UP! Elevate your feet and legs and feet to the level of your heart whenever you are sitting down.   6. SKIN: Carefully monitor skin condition and perform impeccable hygiene daily. Bathe skin with mild soap and water and apply low pH lotion (aka Eucerin ) to improve hydration and limit infection risk.    Lymphatic Pumping Exercises:

## 2019-11-26 ENCOUNTER — Telehealth: Payer: Self-pay | Admitting: *Deleted

## 2019-11-26 NOTE — Telephone Encounter (Signed)
Attempted to call for pre appointment review of allergies/meds. Message left. 

## 2019-11-27 ENCOUNTER — Encounter: Payer: Self-pay | Admitting: Pain Medicine

## 2019-11-29 NOTE — Progress Notes (Signed)
Patient: Susan Callahan  Service Category: E/M  Provider: Gaspar Cola, MD  DOB: 08-Oct-1950  DOS: 12/01/2019  Location: Office  MRN: [id number]  Setting: Ambulatory outpatient  Referring Provider: Konrad Saha, MD  Type: Established Patient  Specialty: Interventional Pain Management  PCP: Konrad Saha, MD  Location: Remote location  Delivery: TeleHealth     Virtual Encounter - Pain Management PROVIDER NOTE: Information contained herein reflects review and annotations entered in association with encounter. Interpretation of such information and data should be left to medically-trained personnel. Information provided to patient can be located elsewhere in the medical record under "Patient Instructions". Document created using STT-dictation technology, any transcriptional errors that may result from process are unintentional.    Contact & Pharmacy Preferred: [phone] Home: [phone] (home) Mobile: [phone] (mobile) E-mail: [email or web address], [place] [address] Phone: [phone] Fax: [phone]   Pre-screening  Susan Callahan offered "in-person" vs "virtual" encounter. She indicated preferring virtual for this encounter.   Reason COVID-19*  Social distancing based on CDC and AMA recommendations.   I contacted Susan Callahan on 12/01/2019 via telephone.      I clearly identified myself as Gaspar Cola, MD. I verified that I was speaking with the correct person using two identifiers (Name: Jerzy Crotteau, and date of birth: Mar 31, 1951).  Consent I sought verbal advanced consent from Susan Callahan for virtual visit interactions. I informed Susan Callahan of possible security and privacy concerns, risks, and limitations associated with providing "not-in-person" medical evaluation and management services. I also informed Susan Callahan of the availability of  "in-person" appointments. Finally, I informed her that there would be a charge for the virtual visit and that she could be  personally, fully or partially, financially responsible for it. Susan Callahan expressed understanding and agreed to proceed.   Historic Elements   Susan Callahan is a 69 y.o. year old, female patient evaluated today after her last contact with our practice on 11/26/2019. Susan Callahan  has a past medical history of COPD (chronic obstructive pulmonary disease) ([place]), Depression (1974), Diabetes mellitus without complication ([place]), Hypertension, Migraine, and Ramsay Hunt auricular syndrome. She also  has a past surgical history that includes Tympanostomy tube placement and Lumbar peritoneal shunt. Susan Callahan has a current medication list which includes the following prescription(s): acetazolamide, allopurinol, amitriptyline, atorvastatin, b complex vitamins, betamethasone dipropionate, calcium carbonate-vitamin d, vitamin d, epinephrine, exemestane, furosemide, magnesium oxide, metformin, multivitamin, mupirocin ointment, [START ON 12/13/2019] tramadol, gabapentin, and verapamil. She  reports that she quit smoking about 28 years ago. Her smoking use included cigarettes. She has a 81.00 pack-year smoking history. She has never used smokeless tobacco. She reports that she does not drink alcohol or use drugs. Susan Callahan is allergic to sulfa antibiotics and bee venom.   HPI  Today, she is being contacted for medication management. The patient indicates doing well with the current medication regimen. No adverse reactions or side effects reported to the medications.   Pharmacotherapy Assessment  Analgesic: Tramadol 50 mg, 1 tab PO q 6 hrs (200 mg/day of tramadol)(enough refills until 06/16/19) MME/day:18m/day.   Monitoring: [place] PMP: PDMP reviewed during this encounter.       Pharmacotherapy: No side-effects or adverse reactions reported. Compliance: No problems  identified. Effectiveness: Clinically acceptable. Plan: Refer to "POC".  UDS:  Summary  Date Value Ref Range Status  12/02/2018 FINAL  Final  Comment:    ==================================================================== TOXASSURE SELECT 13 (MW) ==================================================================== Test                             Result       Flag       Units Drug Present   Tramadol                       >8621                   ng/mg creat   O-Desmethyltramadol            7705                    ng/mg creat   N-Desmethyltramadol            1902                    ng/mg creat    Source of tramadol is a prescription medication.    O-desmethyltramadol and N-desmethyltramadol are expected    metabolites of tramadol. ==================================================================== Test                      Result    Flag   Units      Ref Range   Creatinine              58               mg/dL      >=20 ==================================================================== Declared Medications:  Medication list was not provided. ==================================================================== For clinical consultation, please call [phone]. ====================================================================    Laboratory Chemistry Profile   Renal Lab Results  Component Value Date   BUN 21 07/23/2018   CREATININE 1.05 (H) 07/23/2018   BCR 20 07/23/2018   GFRAA 64 07/23/2018   GFRNONAA 55 (L) 07/23/2018    Hepatic Lab Results  Component Value Date   AST 17 07/23/2018   ALBUMIN 4.7 07/23/2018   ALKPHOS 80 07/23/2018    Electrolytes Lab Results  Component Value Date   NA 135 07/23/2018   K 4.7 07/23/2018   CL 98 07/23/2018   CALCIUM 9.4 07/23/2018   MG 2.6 (H) 07/23/2018    Bone Lab Results  Component Value Date   25OHVITD1 36 07/23/2018   25OHVITD2 <1.0 07/23/2018   25OHVITD3 36 07/23/2018    Inflammation (CRP: Acute Phase) (ESR:  Chronic Phase) Lab Results  Component Value Date   CRP 23 (H) 07/23/2018   ESRSEDRATE 43 (H) 07/23/2018      Note: Above Lab results reviewed.  Imaging  DG PAIN CLINIC C-ARM 1-60 MIN NO REPORT Fluoro was used, but no Radiologist interpretation will be provided.  Please refer to "NOTES" tab for provider progress note.  Assessment  The primary encounter diagnosis was Chronic pain syndrome. Diagnoses of Chronic lower extremity pain (Primary Area of Pain) (Bilateral) (L>R), Chronic low back pain (Secondary Area of Pain) (Bilateral) (L>R) w/ sciatica (Bilateral), Chronic sacroiliac joint pain (Tertiary Area of Pain) (Bilateral) (L>R), Neuropathic postherpetic trigeminal neuralgia, Pharmacologic therapy, Disorder of skeletal system, and Problems influencing health status were also pertinent to this visit.  Plan of Care  Problem-specific:  No problem-specific Assessment & Plan notes found for this encounter.  Susan Callahan has a current medication list which includes the following long-term medication(s): allopurinol, amitriptyline, calcium carbonate-vitamin d, furosemide, metformin, [START ON 12/13/2019]  tramadol, gabapentin, and verapamil.  Pharmacotherapy (Medications Ordered): Meds ordered this encounter  Medications  . traMADol (ULTRAM) 50 MG tablet    Sig: Take 1 tablet (50 mg total) by mouth every 6 (six) hours as needed for severe pain.    Dispense:  120 tablet    Refill:  2    Chronic Pain: STOP Act (Not applicable) Fill 1 day early if closed on refill date. Do not fill until: 12/13/2019. To last until: 06/10/2020. Avoid benzodiazepines within 8 hours of opioids   Orders:  Orders Placed This Encounter  Procedures  . ToxASSURE Select 13 (MW), Urine    Volume: 30 ml(s). Minimum 3 ml of urine is needed. Document temperature of fresh sample. Indications: Long term (current) use of opiate analgesic (J57.017)    Order Specific Question:   Release to patient    Answer:    Immediate  . Comp. Metabolic Panel (12)    With GFR. Indications: Chronic Pain Syndrome (G89.4) & Pharmacotherapy (B93.903)    Order Specific Question:   Has the patient fasted?    Answer:   No    Order Specific Question:   CC Results    Answer:   [id number] [[id number]]    Order Specific Question:   Release to patient    Answer:   Immediate  . Magnesium    Indication: Pharmacologic therapy (Z79.899)    Order Specific Question:   CC Results    Answer:   PCP-NURSE [[id number]]    Order Specific Question:   Release to patient    Answer:   Immediate  . Vitamin B12    Indication: Pharmacologic therapy (Y56.389).    Order Specific Question:   CC Results    Answer:   [id number] [[id number]]    Order Specific Question:   Release to patient    Answer:   Immediate  . Sedimentation rate    Indication: Disorder of skeletal system (M89.9)    Order Specific Question:   CC Results    Answer:   PCP-NURSE [[id number]]    Order Specific Question:   Release to patient    Answer:   Immediate  . 25-Hydroxy vitamin D Lcms D2+D3    Indication: Disorder of skeletal system (M89.9).    Order Specific Question:   CC Results    Answer:   HRC-BULAG [[id number]]    Order Specific Question:   Release to patient    Answer:   Immediate  . C-reactive protein    Indication: Problems influencing health status (Z78.9)    Order Specific Question:   CC Results    Answer:   PCP-NURSE [[id number]]    Order Specific Question:   Release to patient    Answer:   Immediate   Follow-up plan:   Return in about 6 months (around 06/09/2020) for (VV), (MM) to review labwork.      Interventional management options:  Considering:   CAUTION: Has a LUMBOPERITONEAL SPINAL SHUNT entering the canal at L2-3 on left and going up.  Diagnostic Caudal ESI #1  Diagnostic bilateral lumbar facet nerve block #1  Possible bilateral lumbar facet RFA #1    PRN Procedures:   Palliative bilateral L4 TFESI #3  Palliative left L3-4 LESI #3      Recent  Visits No visits were found meeting these conditions.  Showing recent visits within past 90 days and meeting all other requirements   Today's Visits Date Type Provider Dept  12/01/19 Telemedicine Milinda Pointer, [facility]  Showing today's visits and meeting all other requirements   Future Appointments No visits were found meeting these conditions.  Showing future appointments within next 90 days and meeting all other requirements   I discussed the assessment and treatment plan with the patient. The patient was provided an opportunity to ask questions and all were answered. The patient agreed with the plan and demonstrated an understanding of the instructions.  Patient advised to call back or seek an in-person evaluation if the symptoms or condition worsens.  Duration of encounter: 12 minutes.  Note by: Gaspar Cola, MD Date: 12/01/2019; Time: 9:03 AM

## 2019-12-01 ENCOUNTER — Ambulatory Visit: Payer: Medicare Other | Attending: Rehabilitation | Admitting: Occupational Therapy

## 2019-12-01 ENCOUNTER — Telehealth: Payer: Self-pay | Admitting: *Deleted

## 2019-12-01 ENCOUNTER — Other Ambulatory Visit: Payer: Self-pay

## 2019-12-01 ENCOUNTER — Ambulatory Visit: Payer: Medicare Other | Attending: Pain Medicine | Admitting: Pain Medicine

## 2019-12-01 DIAGNOSIS — M79604 Pain in right leg: Secondary | ICD-10-CM | POA: Diagnosis not present

## 2019-12-01 DIAGNOSIS — M5442 Lumbago with sciatica, left side: Secondary | ICD-10-CM | POA: Diagnosis not present

## 2019-12-01 DIAGNOSIS — I972 Postmastectomy lymphedema syndrome: Secondary | ICD-10-CM

## 2019-12-01 DIAGNOSIS — M533 Sacrococcygeal disorders, not elsewhere classified: Secondary | ICD-10-CM

## 2019-12-01 DIAGNOSIS — G8929 Other chronic pain: Secondary | ICD-10-CM

## 2019-12-01 DIAGNOSIS — G894 Chronic pain syndrome: Secondary | ICD-10-CM

## 2019-12-01 DIAGNOSIS — M79605 Pain in left leg: Secondary | ICD-10-CM

## 2019-12-01 DIAGNOSIS — Z79899 Other long term (current) drug therapy: Secondary | ICD-10-CM

## 2019-12-01 DIAGNOSIS — Z789 Other specified health status: Secondary | ICD-10-CM

## 2019-12-01 DIAGNOSIS — M5441 Lumbago with sciatica, right side: Secondary | ICD-10-CM

## 2019-12-01 DIAGNOSIS — B0222 Postherpetic trigeminal neuralgia: Secondary | ICD-10-CM

## 2019-12-01 DIAGNOSIS — M899 Disorder of bone, unspecified: Secondary | ICD-10-CM

## 2019-12-01 MED ORDER — TRAMADOL HCL 50 MG PO TABS: 50.0000 mg | ORAL_TABLET | Freq: Four times a day (QID) | ORAL | 2 refills | Status: AC | PRN

## 2019-12-01 NOTE — Therapy (Signed)
[place] MAIN [facility] SERVICES [address] [place], [place], [address] Phone: [phone]   Fax:  [phone]  Occupational Therapy Treatment  Patient Details  Name: Susan Callahan MRN: [id number] Date of Birth: [birth year] Referring Provider (OT): Humberto Leep, MD   Encounter Date: 12/01/2019  OT End of Session - 12/01/19 1655    Visit Number  55    Number of Visits  72    Date for OT Re-Evaluation  12/18/19    OT Start Time  0100    OT Stop Time  0205    OT Time Calculation (min)  65 min    Activity Tolerance  Patient tolerated treatment well;No increased pain    Behavior During Therapy  WFL for tasks assessed/performed       Past Medical History:  Diagnosis Date  . COPD (chronic obstructive pulmonary disease) ([place])   . Depression 1974  . Diabetes mellitus without complication ([place])   . Hypertension   . Migraine   . 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 LUMBAR PERITONEAL SHUNT    . TYMPANOSTOMY TUBE PLACEMENT      There were no vitals filed for this visit.  Subjective Assessment - 12/01/19 1312    Subjective   Pt presents for OT visit 66/72 (93) to address RUE/ RUQ post-mastectomy lymphedema.Pt shows hand with decreased swelling and increased skin laxity over MPs. Pt last seen on 11/05/19 when she transitioned to Management Phase CDT. Pt reports her armsleeve and glove offer little resistance when donning and doffing. "I think they're getting too big." Pt would like replacements.          LYMPHEDEMA/ONCOLOGY QUESTIONNAIRE - 12/01/19 1651      Right Upper Extremity Lymphedema   Other  RUE limb volume = 2610.27 ml    Other  RUE limb volume is increased by 11.4% since last measured on 09/30/19. RUE limb volume is decreased overallby 9.07% since initialy commencing CDT on 02/19/19.    Other  --              OT Treatments/Exercises (OP) - 12/01/19 0001      ADLs    ADL Education Given  Yes      Manual Therapy   Manual Therapy  Edema management    Compression Bandaging  Pt donns custom compression glove and sleeve with modified independence after session using nylon slippy and friction gloves.             OT Education - 12/01/19 1654    Education Details  Pt edu for changes in limb volumetrics since last measured. Pt edu for garment condition and assessment for condition. Discussed typical replacement schedule    Person(s) Educated  Patient    Methods  Demonstration;Explanation    Comprehension  Verbalized understanding;Returned demonstration          OT Long Term Goals - 11/05/19 1600      OT LONG TERM GOAL #1   Title  Pt independent with modified lymphedema precautions and prevention strategies to limit LE progression.    Status  Achieved      OT LONG TERM GOAL #2   Title  Pt will be independent with lymphedema self-care home program and AWS ther ex to improve functional performance of basic and instrumental ADLs by DC.    Status  Achieved      OT LONG TERM GOAL #3   Title  Pt wil report 0/10 pain with RUE functional use ( reaching, lifting, carrying, dressing, dressing, etc) to achieve independent , pain free ADLs performance.    Status  Achieved      OT LONG TERM GOAL #4   Title  Pt will be able to apply gradient compression wraps using correct techniques independently to control UE swelling and limit LE progression.    Status  Achieved      OT LONG TERM GOAL #5   Title  After skilled trainng Pt will be able to don and doff  appropriate compression garments and/ or devices using correct techniques and assistive devices with extra time  by end of Intensive Phase CDT for optimal LE management to limit progression over time.    Status  Achieved      OT LONG TERM GOAL #6   Title  Due to onset of RUE swelling concentrated primarily in R hand during manual therapy for cording  Pt will achieve 5%, or greater RUE  limb volume  reductions during Intensive Phase CDT to  improve ADLs performances, to reduce infection risk, and to limit LE progression.    Status  Achieved            Plan - 12/01/19 1655    Clinical Impression Statement  Completed RUE comparative limb volumetrics. RUE limb volume is increased by 11.4% since last measured on 09/30/19. RUE limb volume is decreased overallby 9.07% since initialy commencing CDT on 02/19/19.Marland Kitchen Despite substantial incrEASE IN LIMB VOLUme, swelling is well controlled overall, including improvement at hand over MPs. Pt has been wearing ulnar gutter splint with thumb spica on L wroist and hand for a little over a month. Suspect increase in limb volume in R arm, despite decreased edema, may be due to increased compensatory use while L is somewhat out of coommission. Custom compression arm sleeve and glove are worn and in need of replacement. Completed arm sleeve measurements today and will complete new glove measurements at a second   visit due to time constraints. Cont as per POC.    OT Occupational Profile and History  Comprehensive Assessment- Review of records and extensive additional review of physical, cognitive, psychosocial history related to current functional performance    Occupational performance deficits (Please refer to evaluation for details):  ADL's;IADL's;Social Participation;Leisure;Rest and Sleep;Work    Marketing executive / Function / Physical Skills  ADL;Decreased knowledge of precautions;ROM;UE functional use;Decreased knowledge of use of DME;Edema;Skin integrity;Pain;IADL    Rehab Potential  Good    Clinical Decision Making  Several treatment options, min-mod task modification necessary    Comorbidities Affecting Occupational Performance:  Presence of comorbidities impacting occupational performance    Comorbidities impacting occupational performance description:  see SUBJUECTIVE    Modification or Assistance to Complete Evaluation   Min-Moderate modification of tasks  or assist with assess necessary to complete eval    OT Frequency  2x / week    OT Duration  12 weeks    OT Treatment/Interventions  Self-care/ADL training;Therapeutic exercise;Manual lymph drainage;Patient/family education;Therapeutic activities;DME and/or AE instruction;Manual Therapy;Other (comment)   consider fitting with prophylactic cmopression arm sleeve   Plan  BUE AROM ther ex to reduce axillary cording- 2 sets of 10 2-3 x daily    Consulted and Agree with Plan of Care  Patient       Patient will benefit from skilled therapeutic intervention in order to improve the following deficits and impairments:   Body Structure / Function / Physical Skills: ADL, Decreased  knowledge of precautions, ROM, UE functional use, Decreased knowledge of use of DME, Edema, Skin integrity, Pain, IADL       Visit Diagnosis: Postmastectomy lymphedema syndrome    Problem List Patient Active Problem List   Diagnosis Date Noted  . Otalgia of left ear 08/11/2019  . Edema 04/16/2019  . Postoperative wound infection 03/13/2019  . Seroma of breast 03/11/2019  . Age-related osteoporosis without current pathological fracture 02/27/2019  . Malignant neoplasm of central portion of right breast in female, estrogen receptor positive ([place]) 02/12/2019  . Genetic testing 01/31/2019  . Malignant neoplasm of upper-inner quadrant of right breast in female, estrogen receptor positive ([place]) 01/15/2019  . Long term current use of opiate analgesic 12/02/2018  . Tympanic membrane central perforation, left 11/04/2018  . DDD (degenerative disc disease), lumbosacral 10/10/2018  . Abnormal MRI, lumbar spine (08/29/2018) 10/10/2018  . 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 Lumbar nerve root impingement (Bilateral: L4 & L5) (L>R: L5) 10/10/2018  . Lumbar Spinal instability of L4-5 10/10/2018  . Lumbar facet hypertrophy 10/10/2018  . COPD (chronic obstructive pulmonary disease)  with emphysema ([place]) 08/28/2018  . Elevated rheumatoid factor 08/13/2018  . Elevated C-reactive protein (CRP) 08/12/2018  . Elevated sed rate 08/12/2018  . Lumbar facet syndrome (Bilateral) 08/12/2018  . Back pain with left-sided radiculopathy 08/12/2018  . DDD (degenerative disc disease), lumbar 08/12/2018  . Chronic lower extremity pain (Primary Area of Pain) (Bilateral) (L>R) 07/23/2018  . Chronic low back pain (Secondary Area of Pain) (Bilateral) (L>R) w/ sciatica (Bilateral) 07/23/2018  . Chronic pain syndrome 07/23/2018  . Pharmacologic therapy 07/23/2018  . Disorder of skeletal system 07/23/2018  . Problems influencing health status 07/23/2018  . Chronic sacroiliac joint pain [facility] Area of Pain) (Bilateral) (L>R) 07/23/2018  . Status post lumboperitoneal shunt placement 02/26/2018  . 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 Tympanic membrane perforation 05/30/2016  . PMB (postmenopausal bleeding) 04/26/2016  . Rash 12/27/2015  . 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 Plantar fasciitis of foot (Left) 05/26/2015  . Spondylosis of lumbar region without myelopathy or radiculopathy 05/26/2015  . Lumbar foraminal stenosis (L3-4, L4-5, L5-S1) (L>R) 05/26/2015  . Spinal stenosis of lumbar region with neurogenic claudication 05/26/2015  . Encounter for screening mammogram for malignant neoplasm of breast 12/02/2014  . Goiter 12/02/2014  . Chronic idiopathic gout of foot (Right) 07/06/2014  . Biceps tendonitis (Right) 05/19/2014  . Shoulder pain (Right) 05/19/2014  . Facial weakness 05/05/2014  . Post herpetic neuralgia 05/05/2014  . Chronic dysfunction of left eustachian tube 03/24/2014  . Osteopenia 03/13/2014  . Lumbar Grade 1 Anterolisthesis of L3/4 (27mm) & L4/5 (4-96mm)(w/ Dynamic instability) 03/13/2014  . Family history of colon cancer 12/02/2013  . Dyshidrotic eczema 11/17/2013  . Diastasis recti  04/08/2013  . Facial nerve paresis 07/31/2012  . Blepharospasm 05/08/2012  . 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 Obesity 03/28/2012  . Hemoptysis 12/22/2011  . Neuropathic postherpetic trigeminal neuralgia 12/12/2011  . Allergic rhinitis, mild 10/12/2011  . Disequilibrium syndrome 09/27/2011  . Chronic bronchitis ([place]) 06/05/2011  . Essential hypertension 05/24/2011  . Type 2 diabetes mellitus without complications ([place]) 85/27/7824  . Pulmonary nodules 05/05/2011    Andrey Spearman, MS, OTR/L, [facility] 12/01/19 5:00 PM  [place] MAIN [facility] SERVICES [address] [place], [place], [address] Phone: [phone]   Fax:  [phone]  Name: Susan Callahan MRN: [id number] Date of Birth: 1951-02-02

## 2019-12-08 ENCOUNTER — Encounter: Payer: Medicare Other | Admitting: Occupational Therapy

## 2019-12-15 ENCOUNTER — Ambulatory Visit: Payer: Medicare Other | Attending: Rehabilitation | Admitting: Occupational Therapy

## 2019-12-15 ENCOUNTER — Other Ambulatory Visit: Payer: Self-pay

## 2019-12-15 DIAGNOSIS — I972 Postmastectomy lymphedema syndrome: Secondary | ICD-10-CM | POA: Insufficient documentation

## 2019-12-15 NOTE — Therapy (Signed)
[place] MAIN [facility] SERVICES [address] [place], [place], [address] Phone: [phone]   Fax:  [phone]  Occupational Therapy Treatment  Patient Details  Name: Susan Callahan MRN: [id number] Date of Birth: 01-26-1951 Referring Provider (OT): Humberto Leep, MD   Encounter Date: 12/15/2019  OT End of Session - 12/15/19 1617    Visit Number  77    Number of Visits  72    Date for OT Re-Evaluation  12/18/19    OT Start Time  1005    OT Stop Time  1115    OT Time Calculation (min)  70 min    Activity Tolerance  Patient tolerated treatment well;No increased pain    Behavior During Therapy  WFL for tasks assessed/performed       Past Medical History:  Diagnosis Date  . COPD (chronic obstructive pulmonary disease) ([place])   . Depression 1974  . Diabetes mellitus without complication ([place])   . Hypertension   . Migraine   . 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 LUMBAR PERITONEAL SHUNT    . TYMPANOSTOMY TUBE PLACEMENT      There were no vitals filed for this visit.  Subjective Assessment - 12/15/19 1614    Subjective   Pt presents for OT visit 67/72 (74) to address RUE/ RUQ post-mastectomy lymphedema.Pt has no new complaints or concerns today.                   OT Treatments/Exercises (OP) - 12/15/19 0001      ADLs   ADL Education Given  Yes      Manual Therapy   Manual Therapy  Edema management    Compression Bandaging  Pt donns custom compression glove and sleeve with modified independence after session using nylon slippy and friction gloves.             OT Education - 12/15/19 1617    Education Details  Continued skilled Pt/caregiver education  And LE ADL training throughout visit for lymphedema self care/ home program, including compression wrapping, compression garment and device wear/care, lymphatic pumping ther ex, simple self-MLD, and skin care.  Discussed progress towards goals.    Person(s) Educated  Patient    Methods  Demonstration;Explanation    Comprehension  Verbalized understanding;Returned demonstration          OT Long Term Goals - 11/05/19 1600      OT LONG TERM GOAL #1   Title  Pt independent with modified lymphedema precautions and prevention strategies to limit LE progression.    Status  Achieved      OT LONG TERM GOAL #2   Title  Pt will be independent with lymphedema self-care home program and AWS ther ex to improve functional performance of basic and instrumental ADLs by DC.    Status  Achieved      OT LONG TERM GOAL #3   Title  Pt wil report 0/10 pain with RUE functional use ( reaching, lifting, carrying, dressing, dressing, etc) to achieve independent , pain free ADLs performance.    Status  Achieved      OT LONG TERM GOAL #4   Title  Pt will be able to apply gradient compression wraps using correct techniques independently to control UE swelling and limit LE progression.    Status  Achieved      OT LONG TERM GOAL #5   Title  After skilled  trainng Pt will be able to don and doff  appropriate compression garments and/ or devices using correct techniques and assistive devices with extra time  by end of Intensive Phase CDT for optimal LE management to limit progression over time.    Status  Achieved      OT LONG TERM GOAL #6   Title  Due to onset of RUE swelling concentrated primarily in R hand during manual therapy for cording  Pt will achieve 5%, or greater RUE  limb volume reductions during Intensive Phase CDT to  improve ADLs performances, to reduce infection risk, and to limit LE progression.    Status  Achieved            Plan - 12/15/19 1618    Clinical Impression Statement  Completed anatomical measurements for replacements for used, worn custom compression glove and sleeve for optimal control of post mastectomy lymphedema. [employer] faxed to DME vendor. Pt will return for MLD session next  vsit then for custom garment fitting as soon as delivered. Cont as per POC.    OT Occupational Profile and History  Comprehensive Assessment- Review of records and extensive additional review of physical, cognitive, psychosocial history related to current functional performance    Occupational performance deficits (Please refer to evaluation for details):  ADL's;IADL's;Social Participation;Leisure;Rest and Sleep;Work    Marketing executive / Function / Physical Skills  ADL;Decreased knowledge of precautions;ROM;UE functional use;Decreased knowledge of use of DME;Edema;Skin integrity;Pain;IADL    Rehab Potential  Good    Clinical Decision Making  Several treatment options, min-mod task modification necessary    Comorbidities Affecting Occupational Performance:  Presence of comorbidities impacting occupational performance    Comorbidities impacting occupational performance description:  see SUBJUECTIVE    Modification or Assistance to Complete Evaluation   Min-Moderate modification of tasks or assist with assess necessary to complete eval    OT Frequency  2x / week    OT Duration  12 weeks    OT Treatment/Interventions  Self-care/ADL training;Therapeutic exercise;Manual lymph drainage;Patient/family education;Therapeutic activities;DME and/or AE instruction;Manual Therapy;Other (comment)   consider fitting with prophylactic cmopression arm sleeve   Plan  BUE AROM ther ex to reduce axillary cording- 2 sets of 10 2-3 x daily    Consulted and Agree with Plan of Care  Patient       Patient will benefit from skilled therapeutic intervention in order to improve the following deficits and impairments:   Body Structure / Function / Physical Skills: ADL, Decreased knowledge of precautions, ROM, UE functional use, Decreased knowledge of use of DME, Edema, Skin integrity, Pain, IADL       Visit Diagnosis: Postmastectomy lymphedema syndrome    Problem List Patient Active Problem List   Diagnosis Date  Noted  . Otalgia of left ear 08/11/2019  . Edema 04/16/2019  . Postoperative wound infection 03/13/2019  . Seroma of breast 03/11/2019  . Age-related osteoporosis without current pathological fracture 02/27/2019  . Malignant neoplasm of central portion of right breast in female, estrogen receptor positive ([place]) 02/12/2019  . Genetic testing 01/31/2019  . Malignant neoplasm of upper-inner quadrant of right breast in female, estrogen receptor positive ([place]) 01/15/2019  . Long term current use of opiate analgesic 12/02/2018  . Tympanic membrane central perforation, left 11/04/2018  . DDD (degenerative disc disease), lumbosacral 10/10/2018  . Abnormal MRI, lumbar spine (08/29/2018) 10/10/2018  . 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 Lumbar nerve root impingement (Bilateral: L4 & L5) (L>R: L5)  10/10/2018  . Lumbar Spinal instability of L4-5 10/10/2018  . Lumbar facet hypertrophy 10/10/2018  . COPD (chronic obstructive pulmonary disease) with emphysema ([place]) 08/28/2018  . Elevated rheumatoid factor 08/13/2018  . Elevated C-reactive protein (CRP) 08/12/2018  . Elevated sed rate 08/12/2018  . Lumbar facet syndrome (Bilateral) 08/12/2018  . Back pain with left-sided radiculopathy 08/12/2018  . DDD (degenerative disc disease), lumbar 08/12/2018  . Chronic lower extremity pain (Primary Area of Pain) (Bilateral) (L>R) 07/23/2018  . Chronic low back pain (Secondary Area of Pain) (Bilateral) (L>R) w/ sciatica (Bilateral) 07/23/2018  . Chronic pain syndrome 07/23/2018  . Pharmacologic therapy 07/23/2018  . Disorder of skeletal system 07/23/2018  . Problems influencing health status 07/23/2018  . Chronic sacroiliac joint pain [facility] Area of Pain) (Bilateral) (L>R) 07/23/2018  . Status post lumboperitoneal shunt placement 02/26/2018  . 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 Tympanic membrane perforation 05/30/2016   . PMB (postmenopausal bleeding) 04/26/2016  . Rash 12/27/2015  . 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 Plantar fasciitis of foot (Left) 05/26/2015  . Spondylosis of lumbar region without myelopathy or radiculopathy 05/26/2015  . Lumbar foraminal stenosis (L3-4, L4-5, L5-S1) (L>R) 05/26/2015  . Spinal stenosis of lumbar region with neurogenic claudication 05/26/2015  . Encounter for screening mammogram for malignant neoplasm of breast 12/02/2014  . Goiter 12/02/2014  . Chronic idiopathic gout of foot (Right) 07/06/2014  . Biceps tendonitis (Right) 05/19/2014  . Shoulder pain (Right) 05/19/2014  . Facial weakness 05/05/2014  . Post herpetic neuralgia 05/05/2014  . Chronic dysfunction of left eustachian tube 03/24/2014  . Osteopenia 03/13/2014  . Lumbar Grade 1 Anterolisthesis of L3/4 (31mm) & L4/5 (4-93mm)(w/ Dynamic instability) 03/13/2014  . Family history of colon cancer 12/02/2013  . Dyshidrotic eczema 11/17/2013  . Diastasis recti 04/08/2013  . Facial nerve paresis 07/31/2012  . Blepharospasm 05/08/2012  . 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 Obesity 03/28/2012  . Hemoptysis 12/22/2011  . Neuropathic postherpetic trigeminal neuralgia 12/12/2011  . Allergic rhinitis, mild 10/12/2011  . Disequilibrium syndrome 09/27/2011  . Chronic bronchitis ([place]) 06/05/2011  . Essential hypertension 05/24/2011  . Type 2 diabetes mellitus without complications ([place]) 29/56/2130  . Pulmonary nodules 05/05/2011    Andrey Spearman, MS, OTR/L, [facility] 12/15/19 4:21 PM  [place] MAIN [facility] SERVICES [address] [place], [place], [address] Phone: [phone]   Fax:  [phone]  Name: Houa Nie MRN: [id number] Date of Birth: 06-27-1951

## 2020-01-13 ENCOUNTER — Other Ambulatory Visit: Payer: Self-pay

## 2020-01-13 ENCOUNTER — Ambulatory Visit: Payer: Medicare Other | Attending: Rehabilitation | Admitting: Occupational Therapy

## 2020-01-13 DIAGNOSIS — I972 Postmastectomy lymphedema syndrome: Secondary | ICD-10-CM

## 2020-01-13 NOTE — Therapy (Signed)
[place] MAIN [facility] SERVICES [address] [place], [place], [address] Phone: [phone]   Fax:  [phone]  Occupational Therapy Treatment Note, Progress Report, Recertification: Post-mastectomy Lymphedema Care  Patient Details  Name: Susan Callahan MRN: [id number] Date of Birth: 12/18/1950 Referring Provider (OT): Humberto Leep, MD   Encounter Date: 01/13/2020  OT End of Session - 01/13/20 1436    Visit Number  69    Number of Visits  72    Date for OT Re-Evaluation  04/12/20    OT Start Time  0100    OT Stop Time  0231    OT Time Calculation (min)  91 min    Activity Tolerance  Patient tolerated treatment well;No increased pain    Behavior During Therapy  WFL for tasks assessed/performed       Past Medical History:  Diagnosis Date  . COPD (chronic obstructive pulmonary disease) ([place])   . Depression 1974  . Diabetes mellitus without complication ([place])   . Hypertension   . Migraine   . 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 LUMBAR PERITONEAL SHUNT    . TYMPANOSTOMY TUBE PLACEMENT      There were no vitals filed for this visit.  Subjective Assessment - 01/13/20 1431    Subjective   Pt presents for OT visit 68/72 (2) to address RUE/ RUQ post-mastectomy lymphedema.Pt was last seen on 12/15/19. She presents with existing sleeve and new custom glove in place. Pt states she's had the glove for about 1 week . It fits well, is comfortable, and keeps swelling well controlled  by treports.                   OT Treatments/Exercises (OP) - 01/13/20 0001      ADLs   ADL Education Given  Yes      Manual Therapy   Manual Therapy  Edema management;Manual Lymphatic Drainage (MLD)    Manual therapy comments  custom compression garment     Manual Lymphatic Drainage (MLD)  MLD to RUE/RUQ as established using cntalateral AAA    Compression Bandaging  Pt donns custom  compression glove and sleeve with modified independence after session using nylon slippy and friction gloves.             OT Education - 01/13/20 1435    Education Details  Cont Pt edu for LE self care throughout session. Excellent return. Pt is independent  and 100% compliant with LE self care    Person(s) Educated  Patient    Methods  Demonstration;Explanation;Handout    Comprehension  Verbalized understanding;Returned demonstration          OT Long Term Goals - 01/13/20 1400      OT LONG TERM GOAL #7   Title  Pt returns in 3 months for limb volume assessment in affected R arm and hand. Pt will demonstrate less than 3% limb volume increase overall in lime , despite hot weather moths, to limit lymphedema progrewssion over time and to ensure optimal functional performance in all occupational domains.    Baseline  min A    Time  3    Period  Months    Status  New    Target Date  04/12/20            Plan - 01/13/20 1440    Clinical Impression Statement  New, seamless, custom, ccl 2, [employer] PLUS  "  replacement " compression glove fits and functions well. Pt is pleased with fit and reports excellent comfort in theis 3 d knitted garment. Pt tolerated MLD to RUQ/RUE in supe and sidelying without increased pain. OT and Pt we able to feel 2 volar foearm cords release at wrist area during myofacial techniques. Pt has met all OT goals for lymphedema care. She remains 100% compliant with all self care protocols. Pt agrees with plan to return for 3 month follow along  fvisit to assess existing cmpression garments for replacement and assess limb volumes.    OT Occupational Profile and History  Comprehensive Assessment- Review of records and extensive additional review of physical, cognitive, psychosocial history related to current functional performance    Occupational performance deficits (Please refer to evaluation for details):  ADL's;IADL's;Social Participation;Leisure;Rest and  Sleep;Work    Marketing executive / Function / Physical Skills  ADL;Decreased knowledge of precautions;ROM;UE functional use;Decreased knowledge of use of DME;Edema;Skin integrity;Pain;IADL    Rehab Potential  Good    Clinical Decision Making  Several treatment options, min-mod task modification necessary    Comorbidities Affecting Occupational Performance:  Presence of comorbidities impacting occupational performance    Comorbidities impacting occupational performance description:  see SUBJUECTIVE    Modification or Assistance to Complete Evaluation   Min-Moderate modification of tasks or assist with assess necessary to complete eval    OT Frequency  2x / week    OT Duration  12 weeks    OT Treatment/Interventions  Self-care/ADL training;Therapeutic exercise;Manual lymph drainage;Patient/family education;Therapeutic activities;DME and/or AE instruction;Manual Therapy;Other (comment)   consider fitting with prophylactic cmopression arm sleeve   Plan  BUE AROM ther ex to reduce axillary cording- 2 sets of 10 2-3 x daily    Consulted and Agree with Plan of Care  Patient       Patient will benefit from skilled therapeutic intervention in order to improve the following deficits and impairments:   Body Structure / Function / Physical Skills: ADL, Decreased knowledge of precautions, ROM, UE functional use, Decreased knowledge of use of DME, Edema, Skin integrity, Pain, IADL       Visit Diagnosis: Postmastectomy lymphedema syndrome - Plan: Ot plan of care cert/re-cert, Ot plan of care cert/re-cert    Problem List Patient Active Problem List   Diagnosis Date Noted  . Otalgia of left ear 08/11/2019  . Edema 04/16/2019  . Postoperative wound infection 03/13/2019  . Seroma of breast 03/11/2019  . Age-related osteoporosis without current pathological fracture 02/27/2019  . Malignant neoplasm of central portion of right breast in female, estrogen receptor positive ([place]) 02/12/2019  . Genetic  testing 01/31/2019  . Malignant neoplasm of upper-inner quadrant of right breast in female, estrogen receptor positive ([place]) 01/15/2019  . Long term current use of opiate analgesic 12/02/2018  . Tympanic membrane central perforation, left 11/04/2018  . DDD (degenerative disc disease), lumbosacral 10/10/2018  . Abnormal MRI, lumbar spine (08/29/2018) 10/10/2018  . 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 Lumbar nerve root impingement (Bilateral: L4 & L5) (L>R: L5) 10/10/2018  . Lumbar Spinal instability of L4-5 10/10/2018  . Lumbar facet hypertrophy 10/10/2018  . COPD (chronic obstructive pulmonary disease) with emphysema ([place]) 08/28/2018  . Elevated rheumatoid factor 08/13/2018  . Elevated C-reactive protein (CRP) 08/12/2018  . Elevated sed rate 08/12/2018  . Lumbar facet syndrome (Bilateral) 08/12/2018  . Back pain with left-sided radiculopathy 08/12/2018  . DDD (degenerative disc disease), lumbar 08/12/2018  . Chronic lower extremity pain (  Primary Area of Pain) (Bilateral) (L>R) 07/23/2018  . Chronic low back pain (Secondary Area of Pain) (Bilateral) (L>R) w/ sciatica (Bilateral) 07/23/2018  . Chronic pain syndrome 07/23/2018  . Pharmacologic therapy 07/23/2018  . Disorder of skeletal system 07/23/2018  . Problems influencing health status 07/23/2018  . Chronic sacroiliac joint pain [facility] Area of Pain) (Bilateral) (L>R) 07/23/2018  . Status post lumboperitoneal shunt placement 02/26/2018  . 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 Tympanic membrane perforation 05/30/2016  . PMB (postmenopausal bleeding) 04/26/2016  . Rash 12/27/2015  . 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 Plantar fasciitis of foot (Left) 05/26/2015  . Spondylosis of lumbar region without myelopathy or radiculopathy 05/26/2015  . Lumbar foraminal stenosis (L3-4, L4-5, L5-S1) (L>R) 05/26/2015  .  Spinal stenosis of lumbar region with neurogenic claudication 05/26/2015  . Encounter for screening mammogram for malignant neoplasm of breast 12/02/2014  . Goiter 12/02/2014  . Chronic idiopathic gout of foot (Right) 07/06/2014  . Biceps tendonitis (Right) 05/19/2014  . Shoulder pain (Right) 05/19/2014  . Facial weakness 05/05/2014  . Post herpetic neuralgia 05/05/2014  . Chronic dysfunction of left eustachian tube 03/24/2014  . Osteopenia 03/13/2014  . Lumbar Grade 1 Anterolisthesis of L3/4 (14m) & L4/5 (4-167m(w/ Dynamic instability) 03/13/2014  . Family history of colon cancer 12/02/2013  . Dyshidrotic eczema 11/17/2013  . Diastasis recti 04/08/2013  . Facial nerve paresis 07/31/2012  . Blepharospasm 05/08/2012  . 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 Obesity 03/28/2012  . Hemoptysis 12/22/2011  . Neuropathic postherpetic trigeminal neuralgia 12/12/2011  . Allergic rhinitis, mild 10/12/2011  . Disequilibrium syndrome 09/27/2011  . Chronic bronchitis ([place]/24/2012  . Essential hypertension 05/24/2011  . Type 2 diabetes mellitus without complications ([place]/95/0722. Pulmonary nodules 05/05/2011   ThAndrey SpearmanMS, OTR/L, CLNorthwest Eye Surgeons5/04/21 2:54 PM   [place] RELape[facility] CenterERVICES [address]hone: [phone] Fax:  [phone]: Susan MignanoRN: [id number] of Birth: [birth year]

## 2020-02-10 ENCOUNTER — Ambulatory Visit: Payer: Medicare Other | Attending: Rehabilitation | Admitting: Occupational Therapy

## 2020-02-10 ENCOUNTER — Other Ambulatory Visit: Payer: Self-pay

## 2020-02-10 ENCOUNTER — Encounter: Payer: Medicare Other | Admitting: Occupational Therapy

## 2020-02-10 DIAGNOSIS — I972 Postmastectomy lymphedema syndrome: Secondary | ICD-10-CM

## 2020-02-10 NOTE — Therapy (Signed)
[place] MAIN [facility] SERVICES [address] [place], [place], [address] Phone: [phone]   Fax:  [phone]  Occupational Therapy Treatment  Patient Details  Name: Susan Callahan MRN: [id number] Date of Birth: 1950-10-19 Referring Provider (OT): Humberto Leep, MD   Encounter Date: 02/10/2020  OT End of Session - 02/10/20 1438    Visit Number  70    Number of Visits  72    Date for OT Re-Evaluation  04/12/20    OT Start Time  0102    OT Stop Time  0215    OT Time Calculation (min)  73 min    Activity Tolerance  Patient tolerated treatment well;No increased pain    Behavior During Therapy  WFL for tasks assessed/performed       Past Medical History:  Diagnosis Date   COPD (chronic obstructive pulmonary disease) ([place])    Depression 1974   Diabetes mellitus without complication ([place])    Hypertension    Migraine    Ramsay Hunt auricular syndrome     Past Surgical History:  Procedure Laterality Date   LUMBAR PERITONEAL SHUNT     TYMPANOSTOMY TUBE PLACEMENT      There were no vitals filed for this visit.  Subjective Assessment - 02/10/20 1311    Subjective   Pt presents for OT visit 69/72 (36) to address RUE/ RUQ post-mastectomy lymphedema. Pt last seen                           OT Education - 02/10/20 1438    Education Details  Continued skilled Pt/caregiver education  And LE ADL training throughout visit for lymphedema self care/ home program, including compression wrapping, compression garment and device wear/care, lymphatic pumping ther ex, simple self-MLD, and skin care. Discussed progress towards goals.    Person(s) Educated  Patient    Methods  Demonstration;Explanation    Comprehension  Verbalized understanding;Returned demonstration          OT Long Term Goals - 01/13/20 1400      OT LONG TERM GOAL #7   Title  Pt returns in 3 months for limb volume assessment in affected R arm and  hand. Pt will demonstrate less than 3% limb volume increase overall in lime , despite hot weather moths, to limit lymphedema progrewssion over time and to ensure optimal functional performance in all occupational domains.    Baseline  min A    Time  3    Period  Months    Status  New    Target Date  04/12/20            Plan - 02/10/20 1439    Clinical Impression Statement  Modified origina, existing, custom compression sleeve with full silicone band on top by adding a stretchy kinesiotape patch to reduce contact with very sensative skin at proximal volar arm in effort to decrease irritation and increase comfort. Provided MLD in R axilla and volar arm and foearm with limb positioned in full extension and 90 degrees abduction. Unable to palpate deep cords or scar tissue specifically, but Pt reports mild relief at end of session. Applied kinesiotape from arm adjacent to axilla extending across antecubital fossa and circumferentially towards forearm, and also a finger cut strip anchored at axilla and extending accross surgcal scar over lateral breast and trunk. Pt instructed to use only very gentle , low temp heat to axilla with compression sleeve and glove  in place. Instructed Pt NOT to use this modality in bed and NOT to sleep with heating pad on any area of LUQ to limit risk of burns and icreasing LE. Pt wil call to report effect of today's Rx measures and schedule additional manual OT PRN.    OT Occupational Profile and History  Comprehensive Assessment- Review of records and extensive additional review of physical, cognitive, psychosocial history related to current functional performance    Occupational performance deficits (Please refer to evaluation for details):  ADL's;IADL's;Social Participation;Leisure;Rest and Sleep;Work    Marketing executive / Function / Physical Skills  ADL;Decreased knowledge of precautions;ROM;UE functional use;Decreased knowledge of use of DME;Edema;Skin integrity;Pain;IADL     Rehab Potential  Good    Clinical Decision Making  Several treatment options, min-mod task modification necessary    Comorbidities Affecting Occupational Performance:  Presence of comorbidities impacting occupational performance    Comorbidities impacting occupational performance description:  see SUBJUECTIVE    Modification or Assistance to Complete Evaluation   Min-Moderate modification of tasks or assist with assess necessary to complete eval    OT Frequency  2x / week    OT Duration  12 weeks    OT Treatment/Interventions  Self-care/ADL training;Therapeutic exercise;Manual lymph drainage;Patient/family education;Therapeutic activities;DME and/or AE instruction;Manual Therapy;Other (comment)   consider fitting with prophylactic cmopression arm sleeve   Plan  BUE AROM ther ex to reduce axillary cording- 2 sets of 10 2-3 x daily    Consulted and Agree with Plan of Care  Patient       Patient will benefit from skilled therapeutic intervention in order to improve the following deficits and impairments:   Body Structure / Function / Physical Skills: ADL, Decreased knowledge of precautions, ROM, UE functional use, Decreased knowledge of use of DME, Edema, Skin integrity, Pain, IADL       Visit Diagnosis: Postmastectomy lymphedema syndrome    Problem List Patient Active Problem List   Diagnosis Date Noted   Otalgia of left ear 08/11/2019   Edema 04/16/2019   Postoperative wound infection 03/13/2019   Seroma of breast 03/11/2019   Age-related osteoporosis without current pathological fracture 02/27/2019   Malignant neoplasm of central portion of right breast in female, estrogen receptor positive ([place]) 02/12/2019   Genetic testing 01/31/2019   Malignant neoplasm of upper-inner quadrant of right breast in female, estrogen receptor positive ([place]) 01/15/2019   Long term current use of opiate analgesic 12/02/2018   Tympanic membrane central perforation, left 11/04/2018    DDD (degenerative disc disease), lumbosacral 10/10/2018   Abnormal MRI, lumbar spine (08/29/2018) 10/10/2018   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, L4-5) 10/10/2018   Lumbar nerve root impingement (Bilateral: L4 & L5) (L>R: L5) 10/10/2018   Lumbar Spinal instability of L4-5 10/10/2018   Lumbar facet hypertrophy 10/10/2018   COPD (chronic obstructive pulmonary disease) with emphysema ([place]) 08/28/2018   Elevated rheumatoid factor 08/13/2018   Elevated C-reactive protein (CRP) 08/12/2018   Elevated sed rate 08/12/2018   Lumbar facet syndrome (Bilateral) 08/12/2018   Back pain with left-sided radiculopathy 08/12/2018   DDD (degenerative disc disease), lumbar 08/12/2018   Chronic lower extremity pain (Primary Area of Pain) (Bilateral) (L>R) 07/23/2018   Chronic low back pain (Secondary Area of Pain) (Bilateral) (L>R) w/ sciatica (Bilateral) 07/23/2018   Chronic pain syndrome 07/23/2018   Pharmacologic therapy 07/23/2018   Disorder of skeletal system 07/23/2018   Problems influencing health status 07/23/2018   Chronic sacroiliac joint pain (Tertiary Area of Pain) (Bilateral) (L>R)  07/23/2018   Status post lumboperitoneal shunt placement 02/26/2018   Osteoarthritis of first carpometacarpal joint of hand (Left) 05/04/2017   Tympanic membrane perforation 05/30/2016   PMB (postmenopausal bleeding) 04/26/2016   Rash 12/27/2015   Intervertebral disc stenosis of neural canal of lumbar region 05/26/2015   Plantar fasciitis of foot (Left) 05/26/2015   Spondylosis of lumbar region without myelopathy or radiculopathy 05/26/2015   Lumbar foraminal stenosis (L3-4, L4-5, L5-S1) (L>R) 05/26/2015   Spinal stenosis of lumbar region with neurogenic claudication 05/26/2015   Encounter for screening mammogram for malignant neoplasm of breast 12/02/2014   Goiter 12/02/2014   Chronic idiopathic gout of foot (Right) 07/06/2014   Biceps tendonitis  (Right) 05/19/2014   Shoulder pain (Right) 05/19/2014   Facial weakness 05/05/2014   Post herpetic neuralgia 05/05/2014   Chronic dysfunction of left eustachian tube 03/24/2014   Osteopenia 03/13/2014   Lumbar Grade 1 Anterolisthesis of L3/4 (1mm) & L4/5 (4-74mm)(w/ Dynamic instability) 03/13/2014   Family history of colon cancer 12/02/2013   Dyshidrotic eczema 11/17/2013   Diastasis recti 04/08/2013   Facial nerve paresis 07/31/2012   Blepharospasm 05/08/2012   Conductive hearing loss of left ear with unrestricted hearing of right ear 04/08/2012   Obesity 03/28/2012   Hemoptysis 12/22/2011   Neuropathic postherpetic trigeminal neuralgia 12/12/2011   Allergic rhinitis, mild 10/12/2011   Disequilibrium syndrome 09/27/2011   Chronic bronchitis ([place]) 06/05/2011   Essential hypertension 05/24/2011   Type 2 diabetes mellitus without complications ([place]) 03/70/4888   Pulmonary nodules 05/05/2011   Andrey Spearman, MS, OTR/L, CLT-LANA 02/10/20 2:47 PM   [place] MAIN [facility] SERVICES [address] [place], [place], [address] Phone: [phone]   Fax:  [phone]  Name: [place] Nolton MRN: [id number] Date of Birth: 01-Aug-1951

## 2020-03-11 ENCOUNTER — Other Ambulatory Visit: Payer: Self-pay

## 2020-03-11 ENCOUNTER — Encounter: Payer: Medicare Other | Attending: Student

## 2020-03-11 DIAGNOSIS — Z87891 Personal history of nicotine dependence: Secondary | ICD-10-CM | POA: Insufficient documentation

## 2020-03-11 DIAGNOSIS — B0221 Postherpetic geniculate ganglionitis: Secondary | ICD-10-CM | POA: Insufficient documentation

## 2020-03-11 DIAGNOSIS — Z79899 Other long term (current) drug therapy: Secondary | ICD-10-CM | POA: Insufficient documentation

## 2020-03-11 DIAGNOSIS — J449 Chronic obstructive pulmonary disease, unspecified: Secondary | ICD-10-CM | POA: Insufficient documentation

## 2020-03-11 DIAGNOSIS — E119 Type 2 diabetes mellitus without complications: Secondary | ICD-10-CM | POA: Insufficient documentation

## 2020-03-11 DIAGNOSIS — I10 Essential (primary) hypertension: Secondary | ICD-10-CM | POA: Insufficient documentation

## 2020-03-11 DIAGNOSIS — Z7984 Long term (current) use of oral hypoglycemic drugs: Secondary | ICD-10-CM | POA: Insufficient documentation

## 2020-03-11 DIAGNOSIS — F329 Major depressive disorder, single episode, unspecified: Secondary | ICD-10-CM | POA: Insufficient documentation

## 2020-03-11 DIAGNOSIS — R0602 Shortness of breath: Secondary | ICD-10-CM

## 2020-03-11 NOTE — Progress Notes (Signed)
Virtual Visit completed. Patient informed on EP and RD appointment and 6 Minute walk test. Patient also informed of patient health questionnaires on My Chart. Patient Verbalizes understanding. Visit diagnosis can be found in [facility] 01/07/2020.

## 2020-03-18 ENCOUNTER — Encounter: Payer: Medicare Other | Admitting: *Deleted

## 2020-03-18 ENCOUNTER — Other Ambulatory Visit: Payer: Self-pay

## 2020-03-18 VITALS — Ht 65.0 in | Wt 203.9 lb

## 2020-03-18 DIAGNOSIS — Z87891 Personal history of nicotine dependence: Secondary | ICD-10-CM | POA: Diagnosis not present

## 2020-03-18 DIAGNOSIS — E119 Type 2 diabetes mellitus without complications: Secondary | ICD-10-CM | POA: Diagnosis not present

## 2020-03-18 DIAGNOSIS — J449 Chronic obstructive pulmonary disease, unspecified: Secondary | ICD-10-CM | POA: Diagnosis not present

## 2020-03-18 DIAGNOSIS — Z79899 Other long term (current) drug therapy: Secondary | ICD-10-CM | POA: Diagnosis not present

## 2020-03-18 DIAGNOSIS — F329 Major depressive disorder, single episode, unspecified: Secondary | ICD-10-CM | POA: Diagnosis not present

## 2020-03-18 DIAGNOSIS — Z7984 Long term (current) use of oral hypoglycemic drugs: Secondary | ICD-10-CM | POA: Diagnosis not present

## 2020-03-18 DIAGNOSIS — I10 Essential (primary) hypertension: Secondary | ICD-10-CM | POA: Diagnosis not present

## 2020-03-18 DIAGNOSIS — B0221 Postherpetic geniculate ganglionitis: Secondary | ICD-10-CM | POA: Diagnosis not present

## 2020-03-18 DIAGNOSIS — R0602 Shortness of breath: Secondary | ICD-10-CM | POA: Diagnosis not present

## 2020-03-18 NOTE — Patient Instructions (Signed)
Patient Instructions  Patient Details  Name: Susan Callahan MRN: [id number] Date of Birth: [birth year] Referring Provider:  Konrad Saha, MD  Below are your personal goals for exercise, nutrition, and risk factors. Our goal is to help you stay on track towards obtaining and maintaining these goals. We will be discussing your progress on these goals with you throughout the program.  Initial Exercise Prescription:  Initial Exercise Prescription - 03/18/20 1000      Date of Initial Exercise RX and Referring Provider   Date 03/18/20    Referring Provider [name] MD      Oxygen   Oxygen Continuous    Liters 3-4      Treadmill   MPH 1.5    Grade 0    Minutes 15    METs 2.15      NuStep   Level 1    SPM 80    Minutes 15    METs 1.6      REL-XR   Level 1    Speed 50    Minutes 15    METs 1.6      Prescription Details   Frequency (times per week) 2    Duration Progress to 30 minutes of continuous aerobic without signs/symptoms of physical distress      Intensity   THRR 40-80% of Max Heartrate 100-134    Ratings of Perceived Exertion 11-13    Perceived Dyspnea 0-4      Progression   Progression Continue to progress workloads to maintain intensity without signs/symptoms of physical distress.      Resistance Training   Training Prescription Yes    Weight 3 lb    Reps 10-15           Exercise Goals: Frequency: Be able to perform aerobic exercise two to three times per week in program working toward 2-5 days per week of home exercise.  Intensity: Work with a perceived exertion of 11 (fairly light) - 15 (hard) while following your exercise prescription.  We will make changes to your prescription with you as you progress through the program.   Duration: Be able to do 30 to 45 minutes of continuous aerobic exercise in addition to a 5 minute warm-up and a 5 minute cool-down routine.   Nutrition Goals: Your personal nutrition goals will be established when  you do your nutrition analysis with the dietician.  The following are general nutrition guidelines to follow: Cholesterol < 200mg /day Sodium < 1500mg /day Fiber: Women over 50 yrs - 21 grams per day  Personal Goals:  Personal Goals and Risk Factors at Admission - 03/18/20 1055      Core Components/Risk Factors/Patient Goals on Admission    Weight Management Yes;Weight Loss;Obesity    Intervention Weight Management: Develop a combined nutrition and exercise program designed to reach desired caloric intake, while maintaining appropriate intake of nutrient and fiber, sodium and fats, and appropriate energy expenditure required for the weight goal.;Weight Management: Provide education and appropriate resources to help participant work on and attain dietary goals.;Weight Management/Obesity: Establish reasonable short term and long term weight goals.;Obesity: Provide education and appropriate resources to help participant work on and attain dietary goals.    Admit Weight 203 lb 14.4 oz (92.5 kg)    Goal Weight: Short Term 198 lb (89.8 kg)    Goal Weight: Long Term 190 lb (86.2 kg)    Expected Outcomes Short Term: Continue to assess and modify interventions until short term weight is achieved;Long Term:  Adherence to nutrition and physical activity/exercise program aimed toward attainment of established weight goal;Weight Loss: Understanding of general recommendations for a balanced deficit meal plan, which promotes 1-2 lb weight loss per week and includes a negative energy balance of [phone] kcal/d;Understanding recommendations for meals to include 15-35% energy as protein, 25-35% energy from fat, 35-60% energy from carbohydrates, less than 200mg  of dietary cholesterol, 20-35 gm of total fiber daily;Understanding of distribution of calorie intake throughout the day with the consumption of 4-5 meals/snacks    Improve shortness of breath with ADL's Yes    Intervention Provide education, individualized  exercise plan and daily activity instruction to help decrease symptoms of SOB with activities of daily living.    Expected Outcomes Short Term: Improve cardiorespiratory fitness to achieve a reduction of symptoms when performing ADLs;Long Term: Be able to perform more ADLs without symptoms or delay the onset of symptoms    Diabetes Yes    Intervention Provide education about signs/symptoms and action to take for hypo/hyperglycemia.;Provide education about proper nutrition, including hydration, and aerobic/resistive exercise prescription along with prescribed medications to achieve blood glucose in normal ranges: Fasting glucose 65-99 mg/dL    Expected Outcomes Long Term: Attainment of HbA1C < 7%.;Short Term: Participant verbalizes understanding of the signs/symptoms and immediate care of hyper/hypoglycemia, proper foot care and importance of medication, aerobic/resistive exercise and nutrition plan for blood glucose control.    Hypertension Yes    Intervention Provide education on lifestyle modifcations including regular physical activity/exercise, weight management, moderate sodium restriction and increased consumption of fresh fruit, vegetables, and low fat dairy, alcohol moderation, and smoking cessation.;Monitor prescription use compliance.    Expected Outcomes Short Term: Continued assessment and intervention until BP is < 140/64mm HG in hypertensive participants. < 130/78mm HG in hypertensive participants with diabetes, heart failure or chronic kidney disease.;Long Term: Maintenance of blood pressure at goal levels.    Lipids Yes    Intervention Provide education and support for participant on nutrition & aerobic/resistive exercise along with prescribed medications to achieve LDL 70mg , HDL >40mg .    Expected Outcomes Short Term: Participant states understanding of desired cholesterol values and is compliant with medications prescribed. Participant is following exercise prescription and nutrition  guidelines.;Long Term: Cholesterol controlled with medications as prescribed, with individualized exercise RX and with personalized nutrition plan. Value goals: LDL < 70mg , HDL > 40 mg.           Tobacco Use Initial Evaluation: Social History   Tobacco Use  Smoking Status Former Smoker   Packs/day: 3.00   Years: 27.00   Pack years: 81.00   Types: Cigarettes   Quit date: 03/15/1991   Years since quitting: 29.0  Smokeless Tobacco Never Used    Exercise Goals and Review:  Exercise Goals    Row Name 03/18/20 1053             Exercise Goals   Increase Physical Activity Yes       Intervention Provide advice, education, support and counseling about physical activity/exercise needs.;Develop an individualized exercise prescription for aerobic and resistive training based on initial evaluation findings, risk stratification, comorbidities and participant's personal goals.       Expected Outcomes Short Term: Attend rehab on a regular basis to increase amount of physical activity.;Long Term: Add in home exercise to make exercise part of routine and to increase amount of physical activity.;Long Term: Exercising regularly at least 3-5 days a week.       Increase Strength and Stamina Yes  Intervention Provide advice, education, support and counseling about physical activity/exercise needs.;Develop an individualized exercise prescription for aerobic and resistive training based on initial evaluation findings, risk stratification, comorbidities and participant's personal goals.       Expected Outcomes Short Term: Increase workloads from initial exercise prescription for resistance, speed, and METs.;Short Term: Perform resistance training exercises routinely during rehab and add in resistance training at home;Long Term: Improve cardiorespiratory fitness, muscular endurance and strength as measured by increased METs and functional capacity (6MWT)       Able to understand and use rate of  perceived exertion (RPE) scale Yes       Intervention Provide education and explanation on how to use RPE scale       Expected Outcomes Short Term: Able to use RPE daily in rehab to express subjective intensity level;Long Term:  Able to use RPE to guide intensity level when exercising independently       Able to understand and use Dyspnea scale Yes       Intervention Provide education and explanation on how to use Dyspnea scale       Expected Outcomes Short Term: Able to use Dyspnea scale daily in rehab to express subjective sense of shortness of breath during exertion;Long Term: Able to use Dyspnea scale to guide intensity level when exercising independently       Knowledge and understanding of Target Heart Rate Range (THRR) Yes       Intervention Provide education and explanation of THRR including how the numbers were predicted and where they are located for reference       Expected Outcomes Short Term: Able to state/look up THRR;Short Term: Able to use daily as guideline for intensity in rehab;Long Term: Able to use THRR to govern intensity when exercising independently       Able to check pulse independently Yes       Intervention Provide education and demonstration on how to check pulse in carotid and radial arteries.;Review the importance of being able to check your own pulse for safety during independent exercise       Expected Outcomes Short Term: Able to explain why pulse checking is important during independent exercise;Long Term: Able to check pulse independently and accurately       Understanding of Exercise Prescription Yes       Intervention Provide education, explanation, and written materials on patient's individual exercise prescription       Expected Outcomes Short Term: Able to explain program exercise prescription;Long Term: Able to explain home exercise prescription to exercise independently              Copy of goals given to participant.

## 2020-03-18 NOTE — Progress Notes (Signed)
Pulmonary Individual Treatment Plan  Patient Details  Name: Susan Callahan MRN: [id number] Date of Birth: 1951/07/09 Referring Provider:     Pulmonary Rehab from 03/18/2020 in [facility] Cardiac and Pulmonary Rehab  Referring Provider [name] MD      Initial Encounter Date:    Pulmonary Rehab from 03/18/2020 in [facility] Cardiac and Pulmonary Rehab  Date 03/18/20      Visit Diagnosis: Shortness of breath  Patient's Home Medications on Admission:  Current Outpatient Medications:  .  acetaZOLAMIDE (DIAMOX) 250 MG tablet, Take 250 mg by mouth daily. , Disp: , Rfl:  .  allopurinol (ZYLOPRIM) 300 MG tablet, Take 300 mg by mouth daily. , Disp: , Rfl:  .  amitriptyline (ELAVIL) 10 MG tablet, Take 10 mg by mouth at bedtime. , Disp: , Rfl:  .  atorvastatin (LIPITOR) 10 MG tablet, Take 10 mg by mouth daily., Disp: , Rfl:  .  b complex vitamins capsule, Take 1 capsule by mouth daily. , Disp: , Rfl:  .  betamethasone dipropionate (DIPROLENE) 0.05 % ointment, Apply 1 application topically as needed., Disp: , Rfl:  .  Calcium Carbonate-Vitamin D 600-400 MG-UNIT tablet, Take 1 tablet by mouth daily. , Disp: , Rfl:  .  Cholecalciferol (VITAMIN D) 125 MCG (5000 UT) CAPS, Take 5,000 Units by mouth daily., Disp: , Rfl:  .  EPINEPHrine 0.3 mg/0.3 mL IJ SOAJ injection, Inject 0.3 mLs into the muscle as needed., Disp: , Rfl:  .  Exemestane (AROMASIN PO), Take 25 mg by mouth daily., Disp: , Rfl:  .  furosemide (LASIX) 40 MG tablet, Take 40 mg by mouth 2 (two) times daily.  (Patient not taking: Reported on 03/11/2020), Disp: , Rfl:  .  gabapentin (NEURONTIN) 600 MG tablet, Take 600 mg by mouth 2 (two) times daily. , Disp: , Rfl:  .  magnesium oxide (MAG-OX) 400 MG tablet, Take by mouth., Disp: , Rfl:  .  metFORMIN (GLUCOPHAGE-XR) 500 MG 24 hr tablet, Take 500 mg by mouth 2 (two) times daily. , Disp: , Rfl:  .  Multiple Vitamin (MULTIVITAMIN) capsule, Take 1 capsule by mouth daily. , Disp: , Rfl:  .  mupirocin  ointment (BACTROBAN) 2 %, Apply two times a day for 7 days., Disp: 22 g, Rfl: 0 .  traMADol (ULTRAM) 50 MG tablet, Take 1 tablet (50 mg total) by mouth every 6 (six) hours as needed for severe pain., Disp: 120 tablet, Rfl: 2 .  verapamil (CALAN-SR) 240 MG CR tablet, Take 240 mg by mouth daily. , Disp: , Rfl:   Past Medical History: Past Medical History:  Diagnosis Date  . COPD (chronic obstructive pulmonary disease) ([place])   . Depression 1974  . Diabetes mellitus without complication ([place])   . Hypertension   . Migraine   . 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 Packs/day: 3.00  . Years: 27.00  . Pack years: 81.00  . Types: Cigarettes  . Quit date: 03/15/1991  . Years since quitting: 29.0  Smokeless Tobacco Never Used    Labs: Recent Review Flowsheet Data   There is no flowsheet data to display.      Pulmonary Assessment Scores:  Pulmonary Assessment Scores    Row Name 03/18/20 1108         ADL UCSD   ADL Phase Entry     SOB Score total 64     Rest 1  Walk 2     Stairs 5     Bath 0     Dress 2     Shop 4       CAT Score   CAT Score 24       mMRC Score   mMRC Score 2            UCSD: Self-administered rating of dyspnea associated with activities of daily living (ADLs) 6-point scale (0 = "not at all" to 5 = "maximal or unable to do because of breathlessness")  Scoring Scores range from 0 to 120.  Minimally important difference is 5 units  CAT: CAT can identify the health impairment of COPD patients and is better correlated with disease progression.  CAT has a scoring range of zero to 40. The CAT score is classified into four groups of low (less than 10), medium (10 - 20), high (21-30) and very high (31-40) based on the impact level of disease on health status. A CAT score over 10 suggests significant symptoms.  A worsening CAT score could be explained by an exacerbation, poor  medication adherence, poor inhaler technique, or progression of COPD or comorbid conditions.  CAT MCID is 2 points  mMRC: mMRC (Modified Medical Research Council) Dyspnea Scale is used to assess the degree of baseline functional disability in patients of respiratory disease due to dyspnea. No minimal important difference is established. A decrease in score of 1 point or greater is considered a positive change.   Pulmonary Function Assessment:   Exercise Target Goals: Exercise Program Goal: Individual exercise prescription set using results from initial 6 min walk test and THRR while considering  patient's activity barriers and safety.   Exercise Prescription Goal: Initial exercise prescription builds to 30-45 minutes a day of aerobic activity, 2-3 days per week.  Home exercise guidelines will be given to patient during program as part of exercise prescription that the participant will acknowledge.  Education: Aerobic Exercise & Resistance Training: - Gives group verbal and written instruction on the various components of exercise. Focuses on aerobic and resistive training programs and the benefits of this training and how to safely progress through these programs..   Education: Exercise & Equipment Safety: - Individual verbal instruction and demonstration of equipment use and safety with use of the equipment.   Pulmonary Rehab from 03/18/2020 in [facility] Cardiac and Pulmonary Rehab  Date 03/11/20  Educator [facility]  Instruction Review Code 1- Verbalizes Understanding      Education: Exercise Physiology & General Exercise Guidelines: - Group verbal and written instruction with models to review the exercise physiology of the cardiovascular system and associated critical values. Provides general exercise guidelines with specific guidelines to those with heart or lung disease.    Pulmonary Rehab from 07/19/2018 in [facility] Cardiac and Pulmonary Rehab  Date 06/05/18  Educator [facility]  Instruction Review Code  1- Verbalizes Understanding      Education: Flexibility, Balance, Mind/Body Relaxation: Provides group verbal/written instruction on the benefits of flexibility and balance training, including mind/body exercise modes such as yoga, pilates and tai chi.  Demonstration and skill practice provided.   Pulmonary Rehab from 07/19/2018 in [facility] Cardiac and Pulmonary Rehab  Date 07/03/18  Educator [facility]  Instruction Review Code 1- Verbalizes Understanding      Activity Barriers & Risk Stratification:  Activity Barriers & Cardiac Risk Stratification - 03/18/20 1036      Activity Barriers & Cardiac Risk Stratification   Activity Barriers Balance Concerns;Muscular Weakness;Deconditioning;Shortness of Breath;Back Problems  6 Minute Walk:  6 Minute Walk    Row Name 03/18/20 1026         6 Minute Walk   Phase Initial     Distance 700 feet     Walk Time 5.05 minutes     # of Rest Breaks 0     MPH 1.57     METS 1.64     RPE 15     Perceived Dyspnea  4     VO2 Peak 5.75     Symptoms Yes (comment)     Comments SOB     Resting HR 66 bpm     Resting BP 124/64     Resting Oxygen Saturation  94 %     Exercise Oxygen Saturation  during 6 min walk 83 %     Max Ex. HR 96 bpm     Max Ex. BP 154/70     2 Minute Post BP 146/74       Interval HR   1 Minute HR 81     2 Minute HR 88     3 Minute HR 94     4 Minute HR 88     5 Minute HR 88     6 Minute HR 96     2 Minute Post HR 73     Interval Heart Rate? Yes       Interval Oxygen   Interval Oxygen? Yes     Baseline Oxygen Saturation % 94 %     1 Minute Oxygen Saturation % 91 %     1 Minute Liters of Oxygen 3 L     2 Minute Oxygen Saturation % 85 %     2 Minute Liters of Oxygen 3 L     3 Minute Oxygen Saturation % 84 %  at 3:27 83%     3 Minute Liters of Oxygen 3 L     4 Minute Oxygen Saturation % 86 %     4 Minute Liters of Oxygen 3 L     5 Minute Oxygen Saturation % 89 %     5 Minute Liters of Oxygen 3 L     6 Minute  Oxygen Saturation % 87 %     6 Minute Liters of Oxygen 3 L     2 Minute Post Oxygen Saturation % 95 %     2 Minute Post Liters of Oxygen 3 L           Oxygen Initial Assessment:  Oxygen Initial Assessment - 03/11/20 1037      Home Oxygen   Home Oxygen Device Home Concentrator;E-Tanks    Sleep Oxygen Prescription Continuous    Liters per minute 1    Home Exercise Oxygen Prescription Continuous    Liters per minute 2    Home at Rest Exercise Oxygen Prescription Continuous    Liters per minute 1    Compliance with Home Oxygen Use Yes      Initial 6 min Walk   Oxygen Used Continuous    Liters per minute 2      Program Oxygen Prescription   Program Oxygen Prescription Continuous    Liters per minute 2      Intervention   Short Term Goals To learn and exhibit compliance with exercise, home and travel O2 prescription;To learn and understand importance of monitoring SPO2 with pulse oximeter and demonstrate accurate use of the pulse oximeter.;To learn and understand importance of maintaining oxygen saturations>88%;To learn  and demonstrate proper pursed lip breathing techniques or other breathing techniques.;To learn and demonstrate proper use of respiratory medications    Long  Term Goals Exhibits compliance with exercise, home and travel O2 prescription;Maintenance of O2 saturations>88%;Verbalizes importance of monitoring SPO2 with pulse oximeter and return demonstration;Exhibits proper breathing techniques, such as pursed lip breathing or other method taught during program session;Compliance with respiratory medication;Demonstrates proper use of MDI's           Oxygen Re-Evaluation:   Oxygen Discharge (Final Oxygen Re-Evaluation):   Initial Exercise Prescription:  Initial Exercise Prescription - 03/18/20 1000      Date of Initial Exercise RX and Referring Provider   Date 03/18/20    Referring Provider [name] MD      Oxygen   Oxygen Continuous    Liters 3-4        Treadmill   MPH 1.5    Grade 0    Minutes 15    METs 2.15      NuStep   Level 1    SPM 80    Minutes 15    METs 1.6      REL-XR   Level 1    Speed 50    Minutes 15    METs 1.6      Prescription Details   Frequency (times per week) 2    Duration Progress to 30 minutes of continuous aerobic without signs/symptoms of physical distress      Intensity   THRR 40-80% of Max Heartrate 100-134    Ratings of Perceived Exertion 11-13    Perceived Dyspnea 0-4      Progression   Progression Continue to progress workloads to maintain intensity without signs/symptoms of physical distress.      Resistance Training   Training Prescription Yes    Weight 3 lb    Reps 10-15           Perform Capillary Blood Glucose checks as needed.  Exercise Prescription Changes:  Exercise Prescription Changes    Row Name 03/18/20 1000             Response to Exercise   Blood Pressure (Admit) 124/64       Blood Pressure (Exercise) 154/70       Blood Pressure (Exit) 136/70       Heart Rate (Admit) 66 bpm       Heart Rate (Exercise) 96 bpm       Heart Rate (Exit) 67 bpm       Oxygen Saturation (Admit) 94 %       Oxygen Saturation (Exercise) 83 %       Oxygen Saturation (Exit) 94 %       Rating of Perceived Exertion (Exercise) 15       Perceived Dyspnea (Exercise) 4       Symptoms SOB       Comments walk test results              Exercise Comments:   Exercise Goals and Review:  Exercise Goals    Row Name 03/18/20 1053             Exercise Goals   Increase Physical Activity Yes       Intervention Provide advice, education, support and counseling about physical activity/exercise needs.;Develop an individualized exercise prescription for aerobic and resistive training based on initial evaluation findings, risk stratification, comorbidities and participant's personal goals.       Expected Outcomes Short Term: Attend rehab  on a regular basis to increase amount of physical  activity.;Long Term: Add in home exercise to make exercise part of routine and to increase amount of physical activity.;Long Term: Exercising regularly at least 3-5 days a week.       Increase Strength and Stamina Yes       Intervention Provide advice, education, support and counseling about physical activity/exercise needs.;Develop an individualized exercise prescription for aerobic and resistive training based on initial evaluation findings, risk stratification, comorbidities and participant's personal goals.       Expected Outcomes Short Term: Increase workloads from initial exercise prescription for resistance, speed, and METs.;Short Term: Perform resistance training exercises routinely during rehab and add in resistance training at home;Long Term: Improve cardiorespiratory fitness, muscular endurance and strength as measured by increased METs and functional capacity (6MWT)       Able to understand and use rate of perceived exertion (RPE) scale Yes       Intervention Provide education and explanation on how to use RPE scale       Expected Outcomes Short Term: Able to use RPE daily in rehab to express subjective intensity level;Long Term:  Able to use RPE to guide intensity level when exercising independently       Able to understand and use Dyspnea scale Yes       Intervention Provide education and explanation on how to use Dyspnea scale       Expected Outcomes Short Term: Able to use Dyspnea scale daily in rehab to express subjective sense of shortness of breath during exertion;Long Term: Able to use Dyspnea scale to guide intensity level when exercising independently       Knowledge and understanding of Target Heart Rate Range (THRR) Yes       Intervention Provide education and explanation of THRR including how the numbers were predicted and where they are located for reference       Expected Outcomes Short Term: Able to state/look up THRR;Short Term: Able to use daily as guideline for intensity in  rehab;Long Term: Able to use THRR to govern intensity when exercising independently       Able to check pulse independently Yes       Intervention Provide education and demonstration on how to check pulse in carotid and radial arteries.;Review the importance of being able to check your own pulse for safety during independent exercise       Expected Outcomes Short Term: Able to explain why pulse checking is important during independent exercise;Long Term: Able to check pulse independently and accurately       Understanding of Exercise Prescription Yes       Intervention Provide education, explanation, and written materials on patient's individual exercise prescription       Expected Outcomes Short Term: Able to explain program exercise prescription;Long Term: Able to explain home exercise prescription to exercise independently              Exercise Goals Re-Evaluation :   Discharge Exercise Prescription (Final Exercise Prescription Changes):  Exercise Prescription Changes - 03/18/20 1000      Response to Exercise   Blood Pressure (Admit) 124/64    Blood Pressure (Exercise) 154/70    Blood Pressure (Exit) 136/70    Heart Rate (Admit) 66 bpm    Heart Rate (Exercise) 96 bpm    Heart Rate (Exit) 67 bpm    Oxygen Saturation (Admit) 94 %    Oxygen Saturation (Exercise) 83 %    Oxygen Saturation (  Exit) 94 %    Rating of Perceived Exertion (Exercise) 15    Perceived Dyspnea (Exercise) 4    Symptoms SOB    Comments walk test results           Nutrition:  Target Goals: Understanding of nutrition guidelines, daily intake of sodium <1563m, cholesterol <2015m calories 30% from fat and 7% or less from saturated fats, daily to have 5 or more servings of fruits and vegetables.  Education: Controlling Sodium/Reading Food Labels -Group verbal and written material supporting the discussion of sodium use in heart healthy nutrition. Review and explanation with models, verbal and written  materials for utilization of the food label.   [facility]  Date 05/29/18  Educator [facility]  Instruction Review Code 1- Verbalizes Understanding      Education: General Nutrition Guidelines/Fats and Fiber: -Group instruction provided by verbal, written material, models and posters to present the general guidelines for heart healthy nutrition. Gives an explanation and review of dietary fats and fiber.   [facility]  Date 05/22/18  Educator [facility]  Instruction Review Code 1- Verbalizes Understanding      Biometrics:  Pre Biometrics - 03/18/20 1054      Pre Biometrics   Height 5' 5"  (1.651 m)    Weight 203 lb 14.4 oz (92.5 kg)    BMI (Calculated) 33.93    Single Leg Stand 5.88 seconds            Nutrition Therapy Plan and Nutrition Goals:   Nutrition Assessments:  Nutrition Assessments - 03/18/20 1054      MEDFICTS Scores   Pre Score --   took home to complete          MEDIFICTS Score Key:          ?70 Need to make dietary changes          40-70 Heart Healthy Diet         ? 40 Therapeutic Level Cholesterol Diet  Nutrition Goals Re-Evaluation:   Nutrition Goals Discharge (Final Nutrition Goals Re-Evaluation):   Psychosocial: Target Goals: Acknowledge presence or absence of significant depression and/or stress, maximize coping skills, provide positive support system. Participant is able to verbalize types and ability to use techniques and skills needed for reducing stress and depression.   Education: Depression - Provides group verbal and written instruction on the correlation between heart/lung disease and depressed mood, treatment options, and the stigmas associated with seeking treatment.   [facility]  Date 06/26/18  [facility] Code 1- VeUnited States Steel Corporationnderstanding      Education:  Sleep Hygiene -Provides group verbal and written instruction about how sleep can affect your health.  Define sleep hygiene, discuss sleep cycles and impact of sleep habits. Review good sleep hygiene tips.    [facility]  Date 05/15/18  [facility] Code 1- Verbalizes Understanding      Education: Stress and Anxiety: - Provides group verbal and written instruction about the health risks of elevated stress and causes of high stress.  Discuss the correlation between heart/lung disease and anxiety and treatment options. Review healthy ways to manage with stress and anxiety.   [facility]  Date 06/07/18  [facility] 1- Verbalizes Understanding  Initial Review & Psychosocial Screening:  Initial Psych Review & Screening - 03/11/20 1040      Initial Review   Current issues with Current Stress Concerns    Source of Stress Concerns Chronic Illness    Comments Mirka has recently been diagnosed with Lung Cancer and is a big factor in her stress. She feels like she is handling her diagnosis well.      Family Dynamics   Good Support System? Yes    Comments Latishia can talk to her sister in [place] for support.      Barriers   Psychosocial barriers to participate in program The patient should benefit from training in stress management and relaxation.      Screening Interventions   Interventions To provide support and resources with identified psychosocial needs;Encouraged to exercise;Provide feedback about the scores to participant    Expected Outcomes Short Term goal: Utilizing psychosocial counselor, staff and physician to assist with identification of specific Stressors or current issues interfering with healing process. Setting desired goal for each stressor or current issue identified.;Long Term Goal: Stressors or current issues are  controlled or eliminated.;Short Term goal: Identification and review with participant of any Quality of Life or Depression concerns found by scoring the questionnaire.;Long Term goal: The participant improves quality of Life and PHQ9 Scores as seen by post scores and/or verbalization of changes           Quality of Life Scores:  Scores of 19 and below usually indicate a poorer quality of life in these areas.  A difference of  2-3 points is a clinically meaningful difference.  A difference of 2-3 points in the total score of the Quality of Life Index has been associated with significant improvement in overall quality of life, self-image, physical symptoms, and general health in studies assessing change in quality of life.  PHQ-9: Recent Review Flowsheet Data    Depression screen [facility] 2/9 03/18/2020 12/02/2018 10/10/2018 08/12/2018 07/23/2018   Decreased Interest 1 0 0 0 0   Down, Depressed, Hopeless 1 0 0 0 1   PHQ - 2 Score 2 0 0 0 1   Altered sleeping 0 - - - 0   Tired, decreased energy 2 - - - 0   Change in appetite 2 - - - 0   Feeling bad or failure about yourself  0 - - - 0   Trouble concentrating 0 - - - 1   Moving slowly or fidgety/restless 0 - - - 0   Suicidal thoughts 0 - - - 0   PHQ-9 Score 6 - - - 2   Difficult doing work/chores Somewhat difficult - - - Not difficult at all     Interpretation of Total Score  Total Score Depression Severity:  1-4 = Minimal depression, 5-9 = Mild depression, 10-14 = Moderate depression, 15-19 = Moderately severe depression, 20-27 = Severe depression   Psychosocial Evaluation and Intervention:  Psychosocial Evaluation - 03/11/20 1043      Psychosocial Evaluation & Interventions   Interventions Encouraged to exercise with the program and follow exercise prescription    Comments Cadyn has recently been diagnosed with Lung Cancer and is a big factor in her stress. She feels like she is handling her diagnosis well.    Expected Outcomes Short:  Exercise regularly to support mental health and notify staff of any changes. Long: Maintain mental health and well being through teaching of rehab or prescribed medications independently.    Continue Psychosocial Services  Follow up required by staff           Psychosocial Re-Evaluation:   Psychosocial Discharge (Final Psychosocial Re-Evaluation):   Education: Education Goals: Education classes will be provided on a weekly basis, covering required topics. Participant will state understanding/return demonstration of topics presented.  Learning Barriers/Preferences:  Learning Barriers/Preferences - 03/11/20 1040      Learning Barriers/Preferences   Learning Barriers Sight    Learning Preferences None           General Pulmonary Education Topics:  Infection Prevention: - Provides verbal and written material to individual with discussion of infection control including proper hand washing and proper equipment cleaning during exercise session.   Pulmonary Rehab from 03/18/2020 in [facility] Cardiac and Pulmonary Rehab  Date 03/11/20  Educator [facility]  Instruction Review Code 1- Verbalizes Understanding      Falls Prevention: - Provides verbal and written material to individual with discussion of falls prevention and safety.   Pulmonary Rehab from 03/18/2020 in [facility] Cardiac and Pulmonary Rehab  Date 03/11/20  Educator [facility]  Instruction Review Code 1- Verbalizes Understanding      Chronic Lung Diseases: - Group verbal and written instruction to review updates, respiratory medications, advancements in procedures and treatments. Discuss use of supplemental oxygen including available portable oxygen systems, continuous and intermittent flow rates, concentrators, personal use and safety guidelines. Review proper use of inhaler and spacers. Provide informative websites for self-education.    Pulmonary Rehab from 07/19/2018 in [facility] Cardiac and Pulmonary Rehab  Date 05/24/18  Educator [facility]    Instruction Review Code 1- Verbalizes Understanding      Energy Conservation: - Provide group verbal and written instruction for methods to conserve energy, plan and organize activities. Instruct on pacing techniques, use of adaptive equipment and posture/positioning to relieve shortness of breath.   Pulmonary Rehab from 07/19/2018 in [facility] Cardiac and Pulmonary Rehab  Date 07/10/18  Educator [facility]  Instruction Review Code 1- Verbalizes Understanding      Triggers and Exacerbations: - Group verbal and written instruction to review types of environmental triggers and ways to prevent exacerbations. Discuss weather changes, air quality and the benefits of nasal washing. Review warning signs and symptoms to help prevent infections. Discuss techniques for effective airway clearance, coughing, and vibrations.   Pulmonary Rehab from 07/19/2018 in [facility] Cardiac and Pulmonary Rehab  Date 06/12/18  Educator [facility]  Instruction Review Code 1- Verbalizes Understanding      AED/CPR: - Group verbal and written instruction with the use of models to demonstrate the basic use of the AED with the basic ABC's of resuscitation.   Pulmonary Rehab from 07/19/2018 in [facility] Cardiac and Pulmonary Rehab  Date 07/19/18  Educator [facility]  Instruction Review Code 1- Actuary and Physiology of the Lungs: - Group verbal and written instruction with the use of models to provide basic lung anatomy and physiology related to function, structure and complications of lung disease.   Pulmonary Rehab from 07/19/2018 in [facility] Cardiac and Pulmonary Rehab  Date 05/08/18  Educator [place]  Instruction Review Code 1- Verbalizes Understanding      Anatomy & Physiology of the Heart: - Group verbal and written instruction and models provide basic cardiac anatomy and physiology, with the coronary electrical and arterial systems. Review of Valvular disease and Heart Failure   Pulmonary Rehab from 07/19/2018 in [facility]  Cardiac and Pulmonary Rehab  Date 06/21/18  Educator [facility]  Instruction Review Code 1- Verbalizes Understanding  Cardiac Medications: - Group verbal and written instruction to review commonly prescribed medications for heart disease. Reviews the medication, class of the drug, and side effects.   Pulmonary Rehab from 07/19/2018 in [facility] Cardiac and Pulmonary Rehab  Date 07/05/18  Educator [facility]  Instruction Review Code 1- Verbalizes Understanding      Other: -Provides group and verbal instruction on various topics (see comments)   Knowledge Questionnaire Score:  Knowledge Questionnaire Score - 03/18/20 1055      Knowledge Questionnaire Score   Pre Score 16/18 Education: O2 Safety            Core Components/Risk Factors/Patient Goals at Admission:  Personal Goals and Risk Factors at Admission - 03/18/20 1055      Core Components/Risk Factors/Patient Goals on Admission    Weight Management Yes;Weight Loss;Obesity    Intervention Weight Management: Develop a combined nutrition and exercise program designed to reach desired caloric intake, while maintaining appropriate intake of nutrient and fiber, sodium and fats, and appropriate energy expenditure required for the weight goal.;Weight Management: Provide education and appropriate resources to help participant work on and attain dietary goals.;Weight Management/Obesity: Establish reasonable short term and long term weight goals.;Obesity: Provide education and appropriate resources to help participant work on and attain dietary goals.    Admit Weight 203 lb 14.4 oz (92.5 kg)    Goal Weight: Short Term 198 lb (89.8 kg)    Goal Weight: Long Term 190 lb (86.2 kg)    Expected Outcomes Short Term: Continue to assess and modify interventions until short term weight is achieved;Long Term: Adherence to nutrition and physical activity/exercise program aimed toward attainment of established weight goal;Weight Loss: Understanding of general  recommendations for a balanced deficit meal plan, which promotes 1-2 lb weight loss per week and includes a negative energy balance of [phone] kcal/d;Understanding recommendations for meals to include 15-35% energy as protein, 25-35% energy from fat, 35-60% energy from carbohydrates, less than 298m of dietary cholesterol, 20-35 gm of total fiber daily;Understanding of distribution of calorie intake throughout the day with the consumption of 4-5 meals/snacks    Improve shortness of breath with ADL's Yes    Intervention Provide education, individualized exercise plan and daily activity instruction to help decrease symptoms of SOB with activities of daily living.    Expected Outcomes Short Term: Improve cardiorespiratory fitness to achieve a reduction of symptoms when performing ADLs;Long Term: Be able to perform more ADLs without symptoms or delay the onset of symptoms    Diabetes Yes    Intervention Provide education about signs/symptoms and action to take for hypo/hyperglycemia.;Provide education about proper nutrition, including hydration, and aerobic/resistive exercise prescription along with prescribed medications to achieve blood glucose in normal ranges: Fasting glucose 65-99 mg/dL    Expected Outcomes Long Term: Attainment of HbA1C < 7%.;Short Term: Participant verbalizes understanding of the signs/symptoms and immediate care of hyper/hypoglycemia, proper foot care and importance of medication, aerobic/resistive exercise and nutrition plan for blood glucose control.    Hypertension Yes    Intervention Provide education on lifestyle modifcations including regular physical activity/exercise, weight management, moderate sodium restriction and increased consumption of fresh fruit, vegetables, and low fat dairy, alcohol moderation, and smoking cessation.;Monitor prescription use compliance.    Expected Outcomes Short Term: Continued assessment and intervention until BP is < 140/915mHG in hypertensive  participants. < 130/8074mG in hypertensive participants with diabetes, heart failure or chronic kidney disease.;Long Term: Maintenance of blood pressure at goal levels.    Lipids Yes  Intervention Provide education and support for participant on nutrition & aerobic/resistive exercise along with prescribed medications to achieve LDL <10m, HDL >425m    Expected Outcomes Short Term: Participant states understanding of desired cholesterol values and is compliant with medications prescribed. Participant is following exercise prescription and nutrition guidelines.;Long Term: Cholesterol controlled with medications as prescribed, with individualized exercise RX and with personalized nutrition plan. Value goals: LDL < 7021mHDL > 40 mg.           Education:Diabetes - Individual verbal and written instruction to review signs/symptoms of diabetes, desired ranges of glucose level fasting, after meals and with exercise. Acknowledge that pre and post exercise glucose checks will be done for 3 sessions at entry of program.   [facility]  Date 03/11/20  [facility]- Verbalizes Understanding      Education: Know Your Numbers and Risk Factors: -Group verbal and written instruction about important numbers in your health.  Discussion of what are risk factors and how they play a role in the disease process.  Review of Cholesterol, Blood Pressure, Diabetes, and BMI and the role they play in your overall health.   Core Components/Risk Factors/Patient Goals Review:    Core Components/Risk Factors/Patient Goals at Discharge (Final Review):    ITP Comments:  ITP Comments    Row Name 03/11/20 1045 03/18/20 1023         ITP Comments Virtual Visit completed. Patient informed on EP and RD appointment and 6 Minute walk test. Patient also informed of patient health questionnaires on My Chart. Patient Verbalizes understanding.  [facility] Completed 6MWT and gym orientation. Initial ITP created and sent for review to Dr. MarEmily Filbertedical Director.             Comments: Initial ITP

## 2020-03-24 ENCOUNTER — Encounter: Payer: Self-pay | Admitting: *Deleted

## 2020-03-24 DIAGNOSIS — R0602 Shortness of breath: Secondary | ICD-10-CM

## 2020-03-24 NOTE — Progress Notes (Signed)
Pulmonary Individual Treatment Plan  Patient Details  Name: Susan Callahan MRN: [id number] Date of Birth: 10-29-1950 Referring Provider:     Pulmonary Rehab from 03/18/2020 in [facility] Cardiac and Pulmonary Rehab  Referring Provider [name] MD      Initial Encounter Date:    Pulmonary Rehab from 03/18/2020 in [facility] Cardiac and Pulmonary Rehab  Date 03/18/20      Visit Diagnosis: Shortness of breath  Patient's Home Medications on Admission:  Current Outpatient Medications:  .  acetaZOLAMIDE (DIAMOX) 250 MG tablet, Take 250 mg by mouth daily. , Disp: , Rfl:  .  allopurinol (ZYLOPRIM) 300 MG tablet, Take 300 mg by mouth daily. , Disp: , Rfl:  .  amitriptyline (ELAVIL) 10 MG tablet, Take 10 mg by mouth at bedtime. , Disp: , Rfl:  .  atorvastatin (LIPITOR) 10 MG tablet, Take 10 mg by mouth daily., Disp: , Rfl:  .  b complex vitamins capsule, Take 1 capsule by mouth daily. , Disp: , Rfl:  .  betamethasone dipropionate (DIPROLENE) 0.05 % ointment, Apply 1 application topically as needed., Disp: , Rfl:  .  Calcium Carbonate-Vitamin D 600-400 MG-UNIT tablet, Take 1 tablet by mouth daily. , Disp: , Rfl:  .  Cholecalciferol (VITAMIN D) 125 MCG (5000 UT) CAPS, Take 5,000 Units by mouth daily., Disp: , Rfl:  .  EPINEPHrine 0.3 mg/0.3 mL IJ SOAJ injection, Inject 0.3 mLs into the muscle as needed., Disp: , Rfl:  .  Exemestane (AROMASIN PO), Take 25 mg by mouth daily., Disp: , Rfl:  .  furosemide (LASIX) 40 MG tablet, Take 40 mg by mouth 2 (two) times daily.  (Patient not taking: Reported on 03/11/2020), Disp: , Rfl:  .  gabapentin (NEURONTIN) 600 MG tablet, Take 600 mg by mouth 2 (two) times daily. , Disp: , Rfl:  .  magnesium oxide (MAG-OX) 400 MG tablet, Take by mouth., Disp: , Rfl:  .  metFORMIN (GLUCOPHAGE-XR) 500 MG 24 hr tablet, Take 500 mg by mouth 2 (two) times daily. , Disp: , Rfl:  .  Multiple Vitamin (MULTIVITAMIN) capsule, Take 1 capsule by mouth daily. , Disp: , Rfl:  .  mupirocin  ointment (BACTROBAN) 2 %, Apply two times a day for 7 days., Disp: 22 g, Rfl: 0 .  traMADol (ULTRAM) 50 MG tablet, Take 1 tablet (50 mg total) by mouth every 6 (six) hours as needed for severe pain., Disp: 120 tablet, Rfl: 2 .  verapamil (CALAN-SR) 240 MG CR tablet, Take 240 mg by mouth daily. , Disp: , Rfl:   Past Medical History: Past Medical History:  Diagnosis Date  . COPD (chronic obstructive pulmonary disease) ([place])   . Depression 1974  . Diabetes mellitus without complication ([place])   . Hypertension   . Migraine   . 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 Packs/day: 3.00  . Years: 27.00  . Pack years: 81.00  . Types: Cigarettes  . Quit date: 03/15/1991  . Years since quitting: 29.0  Smokeless Tobacco Never Used    Labs: Recent Review Flowsheet Data   There is no flowsheet data to display.      Pulmonary Assessment Scores:  Pulmonary Assessment Scores    Row Name 03/18/20 1108         ADL UCSD   ADL Phase Entry     SOB Score total 64     Rest 1  Walk 2     Stairs 5     Bath 0     Dress 2     Shop 4       CAT Score   CAT Score 24       mMRC Score   mMRC Score 2            UCSD: Self-administered rating of dyspnea associated with activities of daily living (ADLs) 6-point scale (0 = "not at all" to 5 = "maximal or unable to do because of breathlessness")  Scoring Scores range from 0 to 120.  Minimally important difference is 5 units  CAT: CAT can identify the health impairment of COPD patients and is better correlated with disease progression.  CAT has a scoring range of zero to 40. The CAT score is classified into four groups of low (less than 10), medium (10 - 20), high (21-30) and very high (31-40) based on the impact level of disease on health status. A CAT score over 10 suggests significant symptoms.  A worsening CAT score could be explained by an exacerbation, poor  medication adherence, poor inhaler technique, or progression of COPD or comorbid conditions.  CAT MCID is 2 points  mMRC: mMRC (Modified Medical Research Council) Dyspnea Scale is used to assess the degree of baseline functional disability in patients of respiratory disease due to dyspnea. No minimal important difference is established. A decrease in score of 1 point or greater is considered a positive change.   Pulmonary Function Assessment:   Exercise Target Goals: Exercise Program Goal: Individual exercise prescription set using results from initial 6 min walk test and THRR while considering  patient's activity barriers and safety.   Exercise Prescription Goal: Initial exercise prescription builds to 30-45 minutes a day of aerobic activity, 2-3 days per week.  Home exercise guidelines will be given to patient during program as part of exercise prescription that the participant will acknowledge.  Education: Aerobic Exercise & Resistance Training: - Gives group verbal and written instruction on the various components of exercise. Focuses on aerobic and resistive training programs and the benefits of this training and how to safely progress through these programs..   Education: Exercise & Equipment Safety: - Individual verbal instruction and demonstration of equipment use and safety with use of the equipment.   Pulmonary Rehab from 03/18/2020 in [facility] Cardiac and Pulmonary Rehab  Date 03/11/20  Educator [facility]  Instruction Review Code 1- Verbalizes Understanding      Education: Exercise Physiology & General Exercise Guidelines: - Group verbal and written instruction with models to review the exercise physiology of the cardiovascular system and associated critical values. Provides general exercise guidelines with specific guidelines to those with heart or lung disease.    Pulmonary Rehab from 07/19/2018 in [facility] Cardiac and Pulmonary Rehab  Date 06/05/18  Educator [facility]  Instruction Review Code  1- Verbalizes Understanding      Education: Flexibility, Balance, Mind/Body Relaxation: Provides group verbal/written instruction on the benefits of flexibility and balance training, including mind/body exercise modes such as yoga, pilates and tai chi.  Demonstration and skill practice provided.   Pulmonary Rehab from 07/19/2018 in [facility] Cardiac and Pulmonary Rehab  Date 07/03/18  Educator AS  Instruction Review Code 1- Verbalizes Understanding      Activity Barriers & Risk Stratification:  Activity Barriers & Cardiac Risk Stratification - 03/18/20 1036      Activity Barriers & Cardiac Risk Stratification   Activity Barriers Balance Concerns;Muscular Weakness;Deconditioning;Shortness of Breath;Back Problems  6 Minute Walk:  6 Minute Walk    Row Name 03/18/20 1026         6 Minute Walk   Phase Initial     Distance 700 feet     Walk Time 5.05 minutes     # of Rest Breaks 0     MPH 1.57     METS 1.64     RPE 15     Perceived Dyspnea  4     VO2 Peak 5.75     Symptoms Yes (comment)     Comments SOB     Resting HR 66 bpm     Resting BP 124/64     Resting Oxygen Saturation  94 %     Exercise Oxygen Saturation  during 6 min walk 83 %     Max Ex. HR 96 bpm     Max Ex. BP 154/70     2 Minute Post BP 146/74       Interval HR   1 Minute HR 81     2 Minute HR 88     3 Minute HR 94     4 Minute HR 88     5 Minute HR 88     6 Minute HR 96     2 Minute Post HR 73     Interval Heart Rate? Yes       Interval Oxygen   Interval Oxygen? Yes     Baseline Oxygen Saturation % 94 %     1 Minute Oxygen Saturation % 91 %     1 Minute Liters of Oxygen 3 L     2 Minute Oxygen Saturation % 85 %     2 Minute Liters of Oxygen 3 L     3 Minute Oxygen Saturation % 84 %  at 3:27 83%     3 Minute Liters of Oxygen 3 L     4 Minute Oxygen Saturation % 86 %     4 Minute Liters of Oxygen 3 L     5 Minute Oxygen Saturation % 89 %     5 Minute Liters of Oxygen 3 L     6 Minute  Oxygen Saturation % 87 %     6 Minute Liters of Oxygen 3 L     2 Minute Post Oxygen Saturation % 95 %     2 Minute Post Liters of Oxygen 3 L           Oxygen Initial Assessment:  Oxygen Initial Assessment - 03/11/20 1037      Home Oxygen   Home Oxygen Device Home Concentrator;E-Tanks    Sleep Oxygen Prescription Continuous    Liters per minute 1    Home Exercise Oxygen Prescription Continuous    Liters per minute 2    Home at Rest Exercise Oxygen Prescription Continuous    Liters per minute 1    Compliance with Home Oxygen Use Yes      Initial 6 min Walk   Oxygen Used Continuous    Liters per minute 2      Program Oxygen Prescription   Program Oxygen Prescription Continuous    Liters per minute 2      Intervention   Short Term Goals To learn and exhibit compliance with exercise, home and travel O2 prescription;To learn and understand importance of monitoring SPO2 with pulse oximeter and demonstrate accurate use of the pulse oximeter.;To learn and understand importance of maintaining oxygen saturations>88%;To learn  and demonstrate proper pursed lip breathing techniques or other breathing techniques.;To learn and demonstrate proper use of respiratory medications    Long  Term Goals Exhibits compliance with exercise, home and travel O2 prescription;Maintenance of O2 saturations>88%;Verbalizes importance of monitoring SPO2 with pulse oximeter and return demonstration;Exhibits proper breathing techniques, such as pursed lip breathing or other method taught during program session;Compliance with respiratory medication;Demonstrates proper use of MDI's           Oxygen Re-Evaluation:   Oxygen Discharge (Final Oxygen Re-Evaluation):   Initial Exercise Prescription:  Initial Exercise Prescription - 03/18/20 1000      Date of Initial Exercise RX and Referring Provider   Date 03/18/20    Referring Provider [name] MD      Oxygen   Oxygen Continuous    Liters 3-4        Treadmill   MPH 1.5    Grade 0    Minutes 15    METs 2.15      NuStep   Level 1    SPM 80    Minutes 15    METs 1.6      REL-XR   Level 1    Speed 50    Minutes 15    METs 1.6      Prescription Details   Frequency (times per week) 2    Duration Progress to 30 minutes of continuous aerobic without signs/symptoms of physical distress      Intensity   THRR 40-80% of Max Heartrate 100-134    Ratings of Perceived Exertion 11-13    Perceived Dyspnea 0-4      Progression   Progression Continue to progress workloads to maintain intensity without signs/symptoms of physical distress.      Resistance Training   Training Prescription Yes    Weight 3 lb    Reps 10-15           Perform Capillary Blood Glucose checks as needed.  Exercise Prescription Changes:  Exercise Prescription Changes    Row Name 03/18/20 1000             Response to Exercise   Blood Pressure (Admit) 124/64       Blood Pressure (Exercise) 154/70       Blood Pressure (Exit) 136/70       Heart Rate (Admit) 66 bpm       Heart Rate (Exercise) 96 bpm       Heart Rate (Exit) 67 bpm       Oxygen Saturation (Admit) 94 %       Oxygen Saturation (Exercise) 83 %       Oxygen Saturation (Exit) 94 %       Rating of Perceived Exertion (Exercise) 15       Perceived Dyspnea (Exercise) 4       Symptoms SOB       Comments walk test results              Exercise Comments:   Exercise Goals and Review:  Exercise Goals    Row Name 03/18/20 1053             Exercise Goals   Increase Physical Activity Yes       Intervention Provide advice, education, support and counseling about physical activity/exercise needs.;Develop an individualized exercise prescription for aerobic and resistive training based on initial evaluation findings, risk stratification, comorbidities and participant's personal goals.       Expected Outcomes Short Term: Attend rehab  on a regular basis to increase amount of physical  activity.;Long Term: Add in home exercise to make exercise part of routine and to increase amount of physical activity.;Long Term: Exercising regularly at least 3-5 days a week.       Increase Strength and Stamina Yes       Intervention Provide advice, education, support and counseling about physical activity/exercise needs.;Develop an individualized exercise prescription for aerobic and resistive training based on initial evaluation findings, risk stratification, comorbidities and participant's personal goals.       Expected Outcomes Short Term: Increase workloads from initial exercise prescription for resistance, speed, and METs.;Short Term: Perform resistance training exercises routinely during rehab and add in resistance training at home;Long Term: Improve cardiorespiratory fitness, muscular endurance and strength as measured by increased METs and functional capacity (6MWT)       Able to understand and use rate of perceived exertion (RPE) scale Yes       Intervention Provide education and explanation on how to use RPE scale       Expected Outcomes Short Term: Able to use RPE daily in rehab to express subjective intensity level;Long Term:  Able to use RPE to guide intensity level when exercising independently       Able to understand and use Dyspnea scale Yes       Intervention Provide education and explanation on how to use Dyspnea scale       Expected Outcomes Short Term: Able to use Dyspnea scale daily in rehab to express subjective sense of shortness of breath during exertion;Long Term: Able to use Dyspnea scale to guide intensity level when exercising independently       Knowledge and understanding of Target Heart Rate Range (THRR) Yes       Intervention Provide education and explanation of THRR including how the numbers were predicted and where they are located for reference       Expected Outcomes Short Term: Able to state/look up THRR;Short Term: Able to use daily as guideline for intensity in  rehab;Long Term: Able to use THRR to govern intensity when exercising independently       Able to check pulse independently Yes       Intervention Provide education and demonstration on how to check pulse in carotid and radial arteries.;Review the importance of being able to check your own pulse for safety during independent exercise       Expected Outcomes Short Term: Able to explain why pulse checking is important during independent exercise;Long Term: Able to check pulse independently and accurately       Understanding of Exercise Prescription Yes       Intervention Provide education, explanation, and written materials on patient's individual exercise prescription       Expected Outcomes Short Term: Able to explain program exercise prescription;Long Term: Able to explain home exercise prescription to exercise independently              Exercise Goals Re-Evaluation :   Discharge Exercise Prescription (Final Exercise Prescription Changes):  Exercise Prescription Changes - 03/18/20 1000      Response to Exercise   Blood Pressure (Admit) 124/64    Blood Pressure (Exercise) 154/70    Blood Pressure (Exit) 136/70    Heart Rate (Admit) 66 bpm    Heart Rate (Exercise) 96 bpm    Heart Rate (Exit) 67 bpm    Oxygen Saturation (Admit) 94 %    Oxygen Saturation (Exercise) 83 %    Oxygen Saturation (  Exit) 94 %    Rating of Perceived Exertion (Exercise) 15    Perceived Dyspnea (Exercise) 4    Symptoms SOB    Comments walk test results           Nutrition:  Target Goals: Understanding of nutrition guidelines, daily intake of sodium <1520m, cholesterol <202m calories 30% from fat and 7% or less from saturated fats, daily to have 5 or more servings of fruits and vegetables.  Education: Controlling Sodium/Reading Food Labels -Group verbal and written material supporting the discussion of sodium use in heart healthy nutrition. Review and explanation with models, verbal and written  materials for utilization of the food label.   [facility]  Date 05/29/18  Educator [facility]  Instruction Review Code 1- Verbalizes Understanding      Education: General Nutrition Guidelines/Fats and Fiber: -Group instruction provided by verbal, written material, models and posters to present the general guidelines for heart healthy nutrition. Gives an explanation and review of dietary fats and fiber.   [facility]  [facility] 05/22/18  Educator LB  Instruction Review Code 1- Verbalizes Understanding      Biometrics:  Pre Biometrics - 03/18/20 1054      Pre Biometrics   Height 5' 5"  (1.651 m)    Weight 203 lb 14.4 oz (92.5 kg)    BMI (Calculated) 33.93    Single Leg Stand 5.88 seconds            Nutrition Therapy Plan and Nutrition Goals:   Nutrition Assessments:  Nutrition Assessments - 03/18/20 1054      MEDFICTS Scores   Pre Score --   took home to complete          MEDIFICTS Score Key:          ?70 Need to make dietary changes          40-70 Heart Healthy Diet         ? 40 Therapeutic Level Cholesterol Diet  Nutrition Goals Re-Evaluation:   Nutrition Goals Discharge (Final Nutrition Goals Re-Evaluation):   Psychosocial: Target Goals: Acknowledge presence or absence of significant depression and/or stress, maximize coping skills, provide positive support system. Participant is able to verbalize types and ability to use techniques and skills needed for reducing stress and depression.   Education: Depression - Provides group verbal and written instruction on the correlation between heart/lung disease and depressed mood, treatment options, and the stigmas associated with seeking treatment.   [facility]  Date 06/26/18  [facility] Review Code 1- VeUnited States Steel Corporationnderstanding      Education:  Sleep Hygiene -Provides group verbal and written instruction about how sleep can affect your health.  Define sleep hygiene, discuss sleep cycles and impact of sleep habits. Review good sleep hygiene tips.    [facility]  Date 05/15/18  [facility] Review Code 1- Verbalizes Understanding      Education: Stress and Anxiety: - Provides group verbal and written instruction about the health risks of elevated stress and causes of high stress.  Discuss the correlation between heart/lung disease and anxiety and treatment options. Review healthy ways to manage with stress and anxiety.   [facility]  Date 06/07/18  [facility]ator [facility] Review Code 1- Verbalizes Understanding  Initial Review & Psychosocial Screening:  Initial Psych Review & Screening - 03/11/20 1040      Initial Review   Current issues with Current Stress Concerns    Source of Stress Concerns Chronic Illness    Comments Nohea has recently been diagnosed with Lung Cancer and is a big factor in her stress. She feels like she is handling her diagnosis well.      Family Dynamics   Good Support System? Yes    Comments Tiwanna can talk to her sister in [place] for support.      Barriers   Psychosocial barriers to participate in program The patient should benefit from training in stress management and relaxation.      Screening Interventions   Interventions To provide support and resources with identified psychosocial needs;Encouraged to exercise;Provide feedback about the scores to participant    Expected Outcomes Short Term goal: Utilizing psychosocial counselor, staff and physician to assist with identification of specific Stressors or current issues interfering with healing process. Setting desired goal for each stressor or current issue identified.;Long Term Goal: Stressors or current issues are  controlled or eliminated.;Short Term goal: Identification and review with participant of any Quality of Life or Depression concerns found by scoring the questionnaire.;Long Term goal: The participant improves quality of Life and PHQ9 Scores as seen by post scores and/or verbalization of changes           Quality of Life Scores:  Scores of 19 and below usually indicate a poorer quality of life in these areas.  A difference of  2-3 points is a clinically meaningful difference.  A difference of 2-3 points in the total score of the Quality of Life Index has been associated with significant improvement in overall quality of life, self-image, physical symptoms, and general health in studies assessing change in quality of life.  PHQ-9: Recent Review Flowsheet Data    Depression screen [facility] 2/9 03/18/2020 12/02/2018 10/10/2018 08/12/2018 07/23/2018   Decreased Interest 1 0 0 0 0   Down, Depressed, Hopeless 1 0 0 0 1   PHQ - 2 Score 2 0 0 0 1   Altered sleeping 0 - - - 0   Tired, decreased energy 2 - - - 0   Change in appetite 2 - - - 0   Feeling bad or failure about yourself  0 - - - 0   Trouble concentrating 0 - - - 1   Moving slowly or fidgety/restless 0 - - - 0   Suicidal thoughts 0 - - - 0   PHQ-9 Score 6 - - - 2   Difficult doing work/chores Somewhat difficult - - - Not difficult at all     Interpretation of Total Score  Total Score Depression Severity:  1-4 = Minimal depression, 5-9 = Mild depression, 10-14 = Moderate depression, 15-19 = Moderately severe depression, 20-27 = Severe depression   Psychosocial Evaluation and Intervention:  Psychosocial Evaluation - 03/11/20 1043      Psychosocial Evaluation & Interventions   Interventions Encouraged to exercise with the program and follow exercise prescription    Comments Claudette has recently been diagnosed with Lung Cancer and is a big factor in her stress. She feels like she is handling her diagnosis well.    Expected Outcomes Short:  Exercise regularly to support mental health and notify staff of any changes. Long: Maintain mental health and well being through teaching of rehab or prescribed medications independently.    Continue Psychosocial Services  Follow up required by staff           Psychosocial Re-Evaluation:   Psychosocial Discharge (Final Psychosocial Re-Evaluation):   Education: Education Goals: Education classes will be provided on a weekly basis, covering required topics. Participant will state understanding/return demonstration of topics presented.  Learning Barriers/Preferences:  Learning Barriers/Preferences - 03/11/20 1040      Learning Barriers/Preferences   Learning Barriers Sight    Learning Preferences None           General Pulmonary Education Topics:  Infection Prevention: - Provides verbal and written material to individual with discussion of infection control including proper hand washing and proper equipment cleaning during exercise session.   Pulmonary Rehab from 03/18/2020 in [facility] Cardiac and Pulmonary Rehab  Date 03/11/20  Educator [facility]  Instruction Review Code 1- Verbalizes Understanding      Falls Prevention: - Provides verbal and written material to individual with discussion of falls prevention and safety.   Pulmonary Rehab from 03/18/2020 in [facility] Cardiac and Pulmonary Rehab  Date 03/11/20  Educator [facility]  Instruction Review Code 1- Verbalizes Understanding      Chronic Lung Diseases: - Group verbal and written instruction to review updates, respiratory medications, advancements in procedures and treatments. Discuss use of supplemental oxygen including available portable oxygen systems, continuous and intermittent flow rates, concentrators, personal use and safety guidelines. Review proper use of inhaler and spacers. Provide informative websites for self-education.    Pulmonary Rehab from 07/19/2018 in [facility] Cardiac and Pulmonary Rehab  Date 05/24/18  Educator [facility]    Instruction Review Code 1- Verbalizes Understanding      Energy Conservation: - Provide group verbal and written instruction for methods to conserve energy, plan and organize activities. Instruct on pacing techniques, use of adaptive equipment and posture/positioning to relieve shortness of breath.   Pulmonary Rehab from 07/19/2018 in [facility] Cardiac and Pulmonary Rehab  Date 07/10/18  Educator [facility]  Instruction Review Code 1- Verbalizes Understanding      Triggers and Exacerbations: - Group verbal and written instruction to review types of environmental triggers and ways to prevent exacerbations. Discuss weather changes, air quality and the benefits of nasal washing. Review warning signs and symptoms to help prevent infections. Discuss techniques for effective airway clearance, coughing, and vibrations.   Pulmonary Rehab from 07/19/2018 in [facility] Cardiac and Pulmonary Rehab  Date 06/12/18  Educator [facility]  Instruction Review Code 1- Verbalizes Understanding      AED/CPR: - Group verbal and written instruction with the use of models to demonstrate the basic use of the AED with the basic ABC's of resuscitation.   Pulmonary Rehab from 07/19/2018 in [facility] Cardiac and Pulmonary Rehab  Date 07/19/18  Educator [facility]  Instruction Review Code 1- Actuary and Physiology of the Lungs: - Group verbal and written instruction with the use of models to provide basic lung anatomy and physiology related to function, structure and complications of lung disease.   Pulmonary Rehab from 07/19/2018 in [facility] Cardiac and Pulmonary Rehab  Date 05/08/18  Educator [facility]  Instruction Review Code 1- Verbalizes Understanding      Anatomy & Physiology of the Heart: - Group verbal and written instruction and models provide basic cardiac anatomy and physiology, with the coronary electrical and arterial systems. Review of Valvular disease and Heart Failure   Pulmonary Rehab from 07/19/2018 in [facility]  Cardiac and Pulmonary Rehab  Date 06/21/18  Educator [facility]  Instruction Review Code 1- Verbalizes Understanding  Cardiac Medications: - Group verbal and written instruction to review commonly prescribed medications for heart disease. Reviews the medication, class of the drug, and side effects.   Pulmonary Rehab from 07/19/2018 in [facility] Cardiac and Pulmonary Rehab  Date 07/05/18  Educator [facility]  Instruction Review Code 1- Verbalizes Understanding      Other: -Provides group and verbal instruction on various topics (see comments)   Knowledge Questionnaire Score:  Knowledge Questionnaire Score - 03/18/20 1055      Knowledge Questionnaire Score   Pre Score 16/18 Education: O2 Safety            Core Components/Risk Factors/Patient Goals at Admission:  Personal Goals and Risk Factors at Admission - 03/18/20 1055      Core Components/Risk Factors/Patient Goals on Admission    Weight Management Yes;Weight Loss;Obesity    Intervention Weight Management: Develop a combined nutrition and exercise program designed to reach desired caloric intake, while maintaining appropriate intake of nutrient and fiber, sodium and fats, and appropriate energy expenditure required for the weight goal.;Weight Management: Provide education and appropriate resources to help participant work on and attain dietary goals.;Weight Management/Obesity: Establish reasonable short term and long term weight goals.;Obesity: Provide education and appropriate resources to help participant work on and attain dietary goals.    Admit Weight 203 lb 14.4 oz (92.5 kg)    Goal Weight: Short Term 198 lb (89.8 kg)    Goal Weight: Long Term 190 lb (86.2 kg)    Expected Outcomes Short Term: Continue to assess and modify interventions until short term weight is achieved;Long Term: Adherence to nutrition and physical activity/exercise program aimed toward attainment of established weight goal;Weight Loss: Understanding of general  recommendations for a balanced deficit meal plan, which promotes 1-2 lb weight loss per week and includes a negative energy balance of [id number] kcal/d;Understanding recommendations for meals to include 15-35% energy as protein, 25-35% energy from fat, 35-60% energy from carbohydrates, less than 215m of dietary cholesterol, 20-35 gm of total fiber daily;Understanding of distribution of calorie intake throughout the day with the consumption of 4-5 meals/snacks    Improve shortness of breath with ADL's Yes    Intervention Provide education, individualized exercise plan and daily activity instruction to help decrease symptoms of SOB with activities of daily living.    Expected Outcomes Short Term: Improve cardiorespiratory fitness to achieve a reduction of symptoms when performing ADLs;Long Term: Be able to perform more ADLs without symptoms or delay the onset of symptoms    Diabetes Yes    Intervention Provide education about signs/symptoms and action to take for hypo/hyperglycemia.;Provide education about proper nutrition, including hydration, and aerobic/resistive exercise prescription along with prescribed medications to achieve blood glucose in normal ranges: Fasting glucose 65-99 mg/dL    Expected Outcomes Long Term: Attainment of HbA1C < 7%.;Short Term: Participant verbalizes understanding of the signs/symptoms and immediate care of hyper/hypoglycemia, proper foot care and importance of medication, aerobic/resistive exercise and nutrition plan for blood glucose control.    Hypertension Yes    Intervention Provide education on lifestyle modifcations including regular physical activity/exercise, weight management, moderate sodium restriction and increased consumption of fresh fruit, vegetables, and low fat dairy, alcohol moderation, and smoking cessation.;Monitor prescription use compliance.    Expected Outcomes Short Term: Continued assessment and intervention until BP is < 140/980mHG in hypertensive  participants. < 130/8075mG in hypertensive participants with diabetes, heart failure or chronic kidney disease.;Long Term: Maintenance of blood pressure at goal levels.    Lipids Yes  Intervention Provide education and support for participant on nutrition & aerobic/resistive exercise along with prescribed medications to achieve LDL <23m, HDL >488m    Expected Outcomes Short Term: Participant states understanding of desired cholesterol values and is compliant with medications prescribed. Participant is following exercise prescription and nutrition guidelines.;Long Term: Cholesterol controlled with medications as prescribed, with individualized exercise RX and with personalized nutrition plan. Value goals: LDL < 7034mHDL > 40 mg.           Education:Diabetes - Individual verbal and written instruction to review signs/symptoms of diabetes, desired ranges of glucose level fasting, after meals and with exercise. Acknowledge that pre and post exercise glucose checks will be done for 3 sessions at entry of program.   [facility]  Date 03/11/20  [facility]- Verbalizes Understanding      Education: Know Your Numbers and Risk Factors: -Group verbal and written instruction about important numbers in your health.  Discussion of what are risk factors and how they play a role in the disease process.  Review of Cholesterol, Blood Pressure, Diabetes, and BMI and the role they play in your overall health.   Core Components/Risk Factors/Patient Goals Review:    Core Components/Risk Factors/Patient Goals at Discharge (Final Review):    ITP Comments:  ITP Comments    Row Name 03/11/20 1045 03/18/20 1023 03/24/20 0634       ITP Comments Virtual Visit completed. Patient informed on EP and RD appointment and 6 Minute walk test. Patient also informed of patient health questionnaires on My Chart. Patient Verbalizes  understanding. [facility]. Completed 6MWT and gym orientation. Initial ITP created and sent for review to Dr. MarEmily Filbertedical Director. 30 Day review completed. Medical Director ITP review done, changes made as directed, and signed approval by Medical Director.            Comments:

## 2020-03-28 DIAGNOSIS — C3481 Malignant neoplasm of overlapping sites of right bronchus and lung: Secondary | ICD-10-CM | POA: Insufficient documentation

## 2020-03-29 DIAGNOSIS — C3491 Malignant neoplasm of unspecified part of right bronchus or lung: Secondary | ICD-10-CM | POA: Insufficient documentation

## 2020-03-30 ENCOUNTER — Encounter: Payer: Medicare Other | Admitting: *Deleted

## 2020-03-30 ENCOUNTER — Other Ambulatory Visit: Payer: Self-pay

## 2020-03-30 DIAGNOSIS — R0602 Shortness of breath: Secondary | ICD-10-CM

## 2020-03-30 NOTE — Progress Notes (Signed)
Daily Session Note  Patient Details  Name: Susan Callahan MRN: [id number] Date of Birth: 12/03/1950 Referring Provider:     Pulmonary Rehab from 03/18/2020 in [facility] Cardiac and Pulmonary Rehab  Referring Provider [name] MD      Encounter Date: 03/30/2020  Check In:  Session Check In - 03/30/20 1017      Check-In   Supervising physician immediately available to respond to emergencies See telemetry face sheet for immediately available ER MD    Location [facility]-Cardiac & Pulmonary Rehab    Staff Present Nyoka Cowden, RN, BSN, Tyna Jaksch, MS Exercise Physiologist;Amanda Oletta Darter, [place], ACSM CEP, Exercise Physiologist    Virtual Visit No    Medication changes reported     No    Fall or balance concerns reported    No    Tobacco Cessation No Change    Warm-up and Cool-down Performed on first and last piece of equipment    Resistance Training Performed Yes    VAD Patient? No      Pain Assessment   Currently in Pain? No/denies              Social History   Tobacco Use  Smoking Status Former Smoker  . Packs/day: 3.00  . Years: 27.00  . Pack years: 81.00  . Types: Cigarettes  . Quit date: 03/15/1991  . Years since quitting: 29.0  Smokeless Tobacco Never Used    Goals Met:  Independence with exercise equipment Exercise tolerated well No report of cardiac concerns or symptoms Strength training completed today  Goals Unmet:  Not Applicable  Comments: Pt able to follow exercise prescription today without complaint.  Will continue to monitor for progression.    Dr. Emily Filbert is Medical Director for [place] and [facility].

## 2020-04-06 ENCOUNTER — Encounter: Payer: Medicare Other | Admitting: *Deleted

## 2020-04-06 ENCOUNTER — Other Ambulatory Visit: Payer: Self-pay

## 2020-04-06 DIAGNOSIS — R0602 Shortness of breath: Secondary | ICD-10-CM

## 2020-04-06 NOTE — Progress Notes (Signed)
Daily Session Note  Patient Details  Name: Susan Callahan MRN: [id number] Date of Birth: 30-May-1951 Referring Provider:     Pulmonary Rehab from 03/18/2020 in [facility] Cardiac and Pulmonary Rehab  Referring Provider [name] MD      Encounter Date: 04/06/2020  Check In:  Session Check In - 04/06/20 1021      Check-In   Supervising physician immediately available to respond to emergencies See telemetry face sheet for immediately available ER MD    Location [facility]-Cardiac & Pulmonary Rehab    Staff Present Heath Lark, RN, BSN, CCRP;Melissa [place] RDN, Rowe Pavy, BA, ACSM CEP, Exercise Physiologist    Virtual Visit No    Medication changes reported     No    Fall or balance concerns reported    No    Warm-up and Cool-down Performed on first and last piece of equipment    Resistance Training Performed Yes    VAD Patient? No    PAD/SET Patient? No      Pain Assessment   Currently in Pain? No/denies              Social History   Tobacco Use  Smoking Status Former Smoker  . Packs/day: 3.00  . Years: 27.00  . Pack years: 81.00  . Types: Cigarettes  . Quit date: 03/15/1991  . Years since quitting: 29.0  Smokeless Tobacco Never Used    Goals Met:  Proper associated with RPD/PD & O2 Sat Independence with exercise equipment Exercise tolerated well No report of cardiac concerns or symptoms  Goals Unmet:  Not Applicable  Comments: Pt able to follow exercise prescription today without complaint.  Will continue to monitor for progression.    Dr. Emily Filbert is Medical Director for [place] and [facility].

## 2020-04-08 ENCOUNTER — Encounter: Payer: Medicare Other | Admitting: *Deleted

## 2020-04-08 ENCOUNTER — Other Ambulatory Visit: Payer: Self-pay

## 2020-04-08 DIAGNOSIS — R0602 Shortness of breath: Secondary | ICD-10-CM

## 2020-04-08 NOTE — Progress Notes (Signed)
Daily Session Note  Patient Details  Name: Susan Callahan MRN: [id number] Date of Birth: 1950-12-08 Referring Provider:     Pulmonary Rehab from 03/18/2020 in [facility] Cardiac and Pulmonary Rehab  Referring Provider [name] MD      Encounter Date: 04/08/2020  Check In:  Session Check In - 04/08/20 0919      Check-In   Supervising physician immediately available to respond to emergencies See telemetry face sheet for immediately available ER MD    Location [facility]-Cardiac & Pulmonary Rehab    Staff Present Renita Papa, RN BSN;Melissa [place]ai[place] RDN, Rowe Pavy, BA, ACSM CEP, Exercise Physiologist    Virtual Visit No    Medication changes reported     No    Fall or balance concerns reported    No    Warm-up and Cool-down Performed on first and last piece of equipment    Resistance Training Performed Yes    VAD Patient? No    PAD/SET Patient? No      Pain Assessment   Currently in Pain? No/denies              Social History   Tobacco Use  Smoking Status Former Smoker  . Packs/day: 3.00  . Years: 27.00  . Pack years: 81.00  . Types: Cigarettes  . Quit date: 03/15/1991  . Years since quitting: 29.0  Smokeless Tobacco Never Used    Goals Met:  Independence with exercise equipment Exercise tolerated well No report of cardiac concerns or symptoms Strength training completed today  Goals Unmet:  Not Applicable  Comments: Pt able to follow exercise prescription today without complaint.  Will continue to monitor for progression.    Dr. Emily Filbert is Medical Director for [place] and [facility].

## 2020-04-13 ENCOUNTER — Ambulatory Visit: Payer: Medicare Other | Attending: Rehabilitation | Admitting: Occupational Therapy

## 2020-04-13 ENCOUNTER — Encounter: Payer: Medicare Other | Attending: Student | Admitting: *Deleted

## 2020-04-13 ENCOUNTER — Other Ambulatory Visit: Payer: Self-pay

## 2020-04-13 DIAGNOSIS — F329 Major depressive disorder, single episode, unspecified: Secondary | ICD-10-CM | POA: Insufficient documentation

## 2020-04-13 DIAGNOSIS — J449 Chronic obstructive pulmonary disease, unspecified: Secondary | ICD-10-CM | POA: Diagnosis not present

## 2020-04-13 DIAGNOSIS — I10 Essential (primary) hypertension: Secondary | ICD-10-CM | POA: Diagnosis not present

## 2020-04-13 DIAGNOSIS — I972 Postmastectomy lymphedema syndrome: Secondary | ICD-10-CM | POA: Insufficient documentation

## 2020-04-13 DIAGNOSIS — E119 Type 2 diabetes mellitus without complications: Secondary | ICD-10-CM | POA: Diagnosis not present

## 2020-04-13 DIAGNOSIS — Z87891 Personal history of nicotine dependence: Secondary | ICD-10-CM | POA: Insufficient documentation

## 2020-04-13 DIAGNOSIS — Z7984 Long term (current) use of oral hypoglycemic drugs: Secondary | ICD-10-CM | POA: Diagnosis not present

## 2020-04-13 DIAGNOSIS — Z79899 Other long term (current) drug therapy: Secondary | ICD-10-CM | POA: Diagnosis not present

## 2020-04-13 DIAGNOSIS — B0221 Postherpetic geniculate ganglionitis: Secondary | ICD-10-CM | POA: Diagnosis not present

## 2020-04-13 DIAGNOSIS — R0602 Shortness of breath: Secondary | ICD-10-CM | POA: Insufficient documentation

## 2020-04-13 NOTE — Progress Notes (Signed)
Daily Session Note  Patient Details  Name: Susan Callahan MRN: [id number] Date of Birth: 04/22/1951 Referring Provider:     Pulmonary Rehab from 03/18/2020 in [facility] Cardiac and Pulmonary Rehab  Referring Provider [name] MD      Encounter Date: 04/13/2020  Check In:  Session Check In - 04/13/20 1028      Check-In   Supervising physician immediately available to respond to emergencies See telemetry face sheet for immediately available ER MD    Location [facility]-Cardiac & Pulmonary Rehab    Staff Present Heath Lark, RN, BSN, CCRP;Melissa [place] RDN, Rowe Pavy, BA, ACSM CEP, Exercise Physiologist    Virtual Visit No    Medication changes reported     No    Fall or balance concerns reported    No    Warm-up and Cool-down Performed on first and last piece of equipment    Resistance Training Performed Yes    VAD Patient? No    PAD/SET Patient? No      Pain Assessment   Currently in Pain? No/denies              Social History   Tobacco Use  Smoking Status Former Smoker  . Packs/day: 3.00  . Years: 27.00  . Pack years: 81.00  . Types: Cigarettes  . Quit date: 03/15/1991  . Years since quitting: 29.1  Smokeless Tobacco Never Used    Goals Met:  Proper associated with RPD/PD & O2 Sat Independence with exercise equipment Exercise tolerated well No report of cardiac concerns or symptoms  Goals Unmet:  Not Applicable  Comments: Pt able to follow exercise prescription today without complaint.  Will continue to monitor for progression.    Dr. Emily Filbert is Medical Director for [place] and [facility].

## 2020-04-14 NOTE — Therapy (Signed)
[place] MAIN [facility] SERVICES [address] [place], [place], [address] Phone: [phone]   Fax:  [phone]  Occupational Therapy Re-Evaluation and Treatment Note: Lymphedema Care  Patient Details  Name: Susan Callahan MRN: [id number] Date of Birth: [birth year] No data recorded  Encounter Date: 04/13/2020   OT End of Session - 04/14/20 0932    Visit Number 1    Number of Visits 36    Date for OT Re-Evaluation 07/13/20    Authorization Type new POC requesting 36 additional visits 04/13/20    OT Start Time 0100    OT Stop Time 0215    OT Time Calculation (min) 75 min    Activity Tolerance Patient tolerated treatment well;No increased pain    Behavior During Therapy WFL for tasks assessed/performed           Past Medical History:  Diagnosis Date  . COPD (chronic obstructive pulmonary disease) ([place])   . Depression 1974  . Diabetes mellitus without complication ([place])   . Hypertension   . Migraine   . 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 LUMBAR PERITONEAL SHUNT    . TYMPANOSTOMY TUBE PLACEMENT      There were no vitals filed for this visit.   Subjective Assessment - 04/14/20 0932    Subjective  Pt presents for OT visit 70/72 (68) to address RUE/ RUQ post-mastectomy lymphedema. Pt last seen 02/10/20. 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 She has commenced treatment at [facility] and starts chemotherapy in a few days. She reports that she is not a surgical candidate . Pt's goals for todays visit are assess RUE/RUQ progression, assess existing compression garments fit and function, discuss return to OT for LE care to limit and control symptoms.    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 01/30/19 partial mastectomy. Margins clear. 0/2 LN  negative for  disease. actinic keritosis,DDD, DM, obesity, thyroid nodules, pulmonary  nodules, chronic back pain, HTN, COPD, hx depression    Limitations pain and swelling in R axilla and arm, chronic back pain, muscle weakness, impaired balance, decreased RUE AROM, abnormal gait, difficulty walking, impaired functional mobility and transfers,    Repetition Increases Symptoms    Special Tests slight positive Stemmer at R MPs    Patient Stated Goals relduce swelling and pain in my R arm and hand and keep it from getting worse so I can do what I need to do    Currently in Pain? Yes    Pain Score 2     Pain Location Arm    Pain Orientation Right    Pain Descriptors / Indicators Heaviness;Other (Comment)   fullness   Pain Type Chronic pain    Pain Onset --    Pain Frequency Intermittent    Aggravating Factors  lifting, carrying, pushing, pulling, reaching    Pain Relieving Factors compression, manual lymphatic drainage (MLD), elevation    Effect of Pain on Daily Activities LE related pain and swelling limits basic and instrumental ADLs performance ( dressing, bathing, grooming, driving, shopping, home management, cooking, cleaning)    Pain Onset More than a month ago                        OT Treatments/Exercises (OP) - 04/14/20 0001      ADLs   ADL Education Given Yes  Manual Therapy   Manual Therapy Edema management    Manual therapy comments compression garment assessment    Manual Lymphatic Drainage (MLD) MLD to RUE/RUQ as established using cntalateral AAA    Compression Bandaging Max A to don sleeve p MLD due to time constraints. Pt Ily dons glove                  OT Education - 04/14/20 0948    Education Details Intro level Pt edu for palliative lymphedema care as option under self-management phase of Complete Decongestive Therapy (CDT)  vs intensive phase treatment protocol. Intro level discussions re precautions, potential modifications during cancer treatment, goals, including goals to provide symptom and pain relief and decreasing  infection risk by limiting fluid build up in the affected body quadrant by stimulating flow through the lymphatic system, support Pt's ability to maintain functional independence and mobility.    Person(s) Educated Patient    Methods Demonstration;Explanation    Comprehension Verbalized understanding;Returned demonstration;Need further instruction               OT Long Term Goals - 04/14/20 1000      OT LONG TERM GOAL #1   Title Pt modified independent using printed reference  with lymphedema precautions and prevention strategies to limit LE progression.    Baseline min A    Time 12    Period Weeks    Status On-going    Target Date 07/13/20      OT LONG TERM GOAL #2   Title Pt will be modified independent with all lymphedema self-care home program acomponents, including self-MLD ( with Flexitouch device 1 x q day), daily custom compression glove and sleeve, daily skin care and lymphatic pumping ther ex using assistive devices and with extra time in keeping with energy conservation and work simplification principals.      Baseline Mod A    Time 12    Period Weeks    Status New    Target Date 07/13/20      OT LONG TERM GOAL #3   Title Pt wil report 0/10 pain with RUE functional use ( reaching, lifting, carrying, dressing, dressing, etc) to achieve independent , pain free ADLs performance.    Baseline min A    Time 12    Period Weeks    Status New    Target Date 07/13/20      OT LONG TERM GOAL #4   Title Pt able to don and doff all custom compression garments and devices usiing assistive devices and/or extra time PRN to ensure optimal LE symptom control and to limit progression    Baseline supervision    Time 12    Period Weeks    Status On-going    Target Date 07/13/20      OT LONG TERM GOAL #6   Title Pt will retain all clinical gains achieved during Intensive Phase CDT to limit lLE progression and further functional decline as evidenced by no more than 3% upward volume  fluctuation in RUE/RUQ  over time.    Baseline Mod A    Time 12    Period Weeks    Status New    Target Date 07/13/20                 Plan - 04/14/20 1027    Clinical Impression Statement Susan Callahan presents today for LE follow along visit. 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 Susan Callahan tolerated MLD and skin care without increased pain today. Lymphedema, including palpable fibrosis, is well managed since last visit in June. Mild cording is palpable in deep axilla, but pain associated w/ AWS has resolved by report. Pt remains diligent with all LE self care home program components, but is experiencing difficulty sustaining all typical daily routines due to cancer-related fatigue and SOB. Existing compression garment   s show signs of normal wear, but are not yet in need of replacement. Susan Callahan will benefit from return to OT for supportive lymphedema care while undergoing active treatment for lung cancer to limit LE-associated pain, to decrease infection risk by limiting fluid build up in the affected limb/quadrant, to facilitate optimal LE symptom control and functional independence by supporting lymphedema self-care while experiencing chemotherapy-related fatigue and other limiting symptoms.    OT Occupational Profile and History Comprehensive Assessment- Review of records and extensive additional review of physical, cognitive, psychosocial history related to current functional performance    Occupational performance deficits (Please refer to evaluation for details): ADL's;IADL's;Social Participation;Leisure;Rest and Sleep;Work    Marketing executive / Function / Physical Skills ADL;Decreased knowledge of precautions;ROM;UE functional use;Decreased knowledge of use of DME;Edema;Skin integrity;Pain;IADL;Sensation;Mobility;Strength    Rehab Potential Good    Clinical Decision Making Several treatment options, min-mod task modification  necessary    Comorbidities Affecting Occupational Performance: Presence of comorbidities impacting occupational performance    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 Modify schedule in keeping with burden of cancer care   OT Treatment/Interventions Self-care/ADL training;Therapeutic exercise;Manual lymph drainage;Patient/family education;Therapeutic activities;DME and/or AE instruction;Manual Therapy;Other (comment);Energy conservation;Coping strategies training   replace custom compression garments PRN. Replace with alternative garments PRN, retrain use of Flexitouch PRN,   Plan lymphatic pumping ther ex; diaphragmatic breathing/ pursed lip breathing, relaxation techniques    Consulted and Agree with Plan of Care Patient           Patient will benefit from skilled therapeutic intervention in order to improve the following deficits and impairments:   Body Structure / Function / Physical Skills: ADL, Decreased knowledge of precautions, ROM, UE functional use, Decreased knowledge of use of DME, Edema, Skin integrity, Pain, IADL, Sensation, Mobility, Strength       Visit Diagnosis: Postmastectomy lymphedema syndrome - Plan: Ot plan of care cert/re-cert    Problem List Patient Active Problem List   Diagnosis Date Noted  . Otalgia of left ear 08/11/2019  . Edema 04/16/2019  . Postoperative wound infection 03/13/2019  . Seroma of breast 03/11/2019  . Age-related osteoporosis without current pathological fracture 02/27/2019  . Malignant neoplasm of central portion of right breast in female, estrogen receptor positive ([place]) 02/12/2019  . Genetic testing 01/31/2019  . Malignant neoplasm of upper-inner quadrant of right breast in female, estrogen  receptor positive ([place]) 01/15/2019  . Long term current use of opiate analgesic 12/02/2018  . Tympanic membrane central perforation, left 11/04/2018  . DDD (degenerative disc disease), lumbosacral 10/10/2018  . Abnormal MRI, lumbar spine (08/29/2018) 10/10/2018  . 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 Lumbar nerve root impingement (Bilateral: L4 & L5) (L>R: L5) 10/10/2018  . Lumbar Spinal instability of L4-5 10/10/2018  . Lumbar facet hypertrophy 10/10/2018  . 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 Elevated rheumatoid factor 08/13/2018  . Elevated C-reactive protein (CRP) 08/12/2018  . Elevated sed rate 08/12/2018  . Lumbar facet syndrome (Bilateral) 08/12/2018  . Back pain with left-sided radiculopathy 08/12/2018  . DDD (degenerative disc disease), lumbar 08/12/2018  . Chronic lower extremity pain (Primary Area of Pain) (Bilateral) (L>R) 07/23/2018  . Chronic low back pain (Secondary Area of Pain) (Bilateral) (L>R) w/ sciatica (Bilateral) 07/23/2018  . Chronic pain syndrome 07/23/2018  . Pharmacologic therapy 07/23/2018  . Disorder of skeletal system 07/23/2018  . Problems influencing health status 07/23/2018  . Chronic sacroiliac joint pain [facility] Area of Pain) (Bilateral) (L>R) 07/23/2018  . Status post lumboperitoneal shunt placement 02/26/2018  . 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 Tympanic membrane perforation 05/30/2016  . PMB (postmenopausal bleeding) 04/26/2016  . Rash 12/27/2015  . 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 Plantar fasciitis of foot (Left) 05/26/2015  . Spondylosis of lumbar region without myelopathy or radiculopathy 05/26/2015  . Lumbar foraminal stenosis (L3-4, L4-5, L5-S1) (L>R) 05/26/2015  . Spinal stenosis of lumbar region with neurogenic claudication 05/26/2015  . Encounter for screening  mammogram for malignant neoplasm of breast 12/02/2014  . Goiter 12/02/2014  . Chronic idiopathic gout of foot (Right) 07/06/2014  . Biceps tendonitis (Right) 05/19/2014  . Shoulder pain (Right) 05/19/2014  . Facial weakness 05/05/2014  . Post herpetic neuralgia 05/05/2014  . Chronic dysfunction of left eustachian tube 03/24/2014  . Osteopenia 03/13/2014  . Lumbar Grade 1 Anterolisthesis of L3/4 (35mm) & L4/5 (4-72mm)(w/ Dynamic instability) 03/13/2014  . Family history of colon cancer 12/02/2013  . Dyshidrotic eczema 11/17/2013  . Diastasis recti 04/08/2013  . Facial nerve paresis 07/31/2012  . Blepharospasm 05/08/2012  . 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 Obesity 03/28/2012  . Hemoptysis 12/22/2011  . Neuropathic postherpetic trigeminal neuralgia 12/12/2011  . Allergic rhinitis, mild 10/12/2011  . Disequilibrium syndrome 09/27/2011  . Chronic bronchitis ([place]) 06/05/2011  . Essential hypertension 05/24/2011  . Type 2 diabetes mellitus without complications ([place]) 76/54/6503  . Pulmonary nodules 05/05/2011   Andrey Spearman, MS, OTR/L, [facility] 04/14/20 10:58 AM   [place] MAIN [facility] SERVICES [address] [place], [place], [address] Phone: [phone]   Fax:  [phone]  Name: Lujain Kraszewski MRN: [id number] Date of Birth: 07-09-1951

## 2020-04-14 NOTE — Patient Instructions (Signed)
Lymphedema Precautions   Dopler study required prior to participating in Complete Decongestive Therapy (CDT) for lymphedema (LE) care  to rule out DVT   If you experience atypical shortness of breath, or notice any signs /symptoms of skin infection (aka cellulitis) remove all compression wraps/ garments, discontinue manual lymphatic drainage (MLD), and report symptoms to your physician immediately. Discontinue MLD and compression for 72 hours after you take your first oral antibiotic so not to spread the infection.   Lymphedema Self- Care Instructions  1. EXERCISE: Perform lymphatic pumping there ex 2 x a day. While wearing your compression wraps or garments. Perform 10 reps of each exercise bilaterally and be sure to perform them in order. Don;t skip around!  OMIT PARTIAL SIT UP  2. MLD: Perform simple self-Manual Lymphatic Drainage (MLD) at least once a day as directed.  3. WRAPS: Compression wraps are to be worn 23 hrs/ 7 days/wk during Intensive Phase of Complete Decongestive Therapy (CDT).Building tolerance may take time and practice, so don't get discouraged. If bandages begin to feel tight during periods of inactivity and/or during the night, try performing your exercises to loosen them.   4. GARMENTS: During Management Phase CDT your compression garments are to be worn during waking hours when active. Do NOT sleep in your garments!!   5. PUT YOUR FEET UP! Elevate your feet and legs and feet to the level of your heart whenever you are sitting down.   6. SKIN: Carefully monitor skin condition and perform impeccable hygiene daily. Bathe skin with mild soap and water and apply low pH lotion (aka Eucerin ) to improve hydration and limit infection risk.    Lymphatic Pumping Exercises:

## 2020-04-15 ENCOUNTER — Encounter: Payer: Medicare Other | Admitting: *Deleted

## 2020-04-15 ENCOUNTER — Other Ambulatory Visit: Payer: Self-pay

## 2020-04-15 DIAGNOSIS — R0602 Shortness of breath: Secondary | ICD-10-CM

## 2020-04-15 NOTE — Progress Notes (Signed)
Daily Session Note  Patient Details  Name: Susan Callahan MRN: [id number] Date of Birth: 07/26/1951 Referring Provider:     Pulmonary Rehab from 03/18/2020 in [facility] Cardiac and Pulmonary Rehab  Referring Provider Susan Lade MD      Encounter Date: 04/15/2020  Check In:  Session Check In - 04/15/20 0934      Check-In   Supervising physician immediately available to respond to emergencies See telemetry face sheet for immediately available ER MD    Location [facility]-Cardiac & Pulmonary Rehab    Staff Present Susan Lark, RN, BSN, CCRP;Susan Sherryll Burger, RN BSN;Susan Callahan RDN, Susan Callahan, BA, ACSM CEP, Exercise Physiologist    Virtual Visit No    Medication changes reported     No    Fall or balance concerns reported    No    Warm-up and Cool-down Performed on first and last piece of equipment    Resistance Training Performed Yes    VAD Patient? No    PAD/SET Patient? No      Pain Assessment   Currently in Pain? No/denies              Social History   Tobacco Use  Smoking Status Former Smoker  . Packs/day: 3.00  . Years: 27.00  . Pack years: 81.00  . Types: Cigarettes  . Quit date: 03/15/1991  . Years since quitting: 29.1  Smokeless Tobacco Never Used    Goals Met:  Proper associated with RPD/PD & O2 Sat Independence with exercise equipment Exercise tolerated well No report of cardiac concerns or symptoms  Goals Unmet:  Not Applicable  Comments: Pt able to follow exercise prescription today without complaint.  Will continue to monitor for progression.    Dr. Emily Filbert is Medical Director for [facility].

## 2020-04-21 ENCOUNTER — Encounter: Payer: Self-pay | Admitting: *Deleted

## 2020-04-21 DIAGNOSIS — R0602 Shortness of breath: Secondary | ICD-10-CM

## 2020-04-21 NOTE — Progress Notes (Signed)
Pulmonary Individual Treatment Plan  Patient Details  Name: Stepanie Graver MRN: [id number] Date of Birth: 16-Nov-1950 Referring Provider:     Pulmonary Rehab from 03/18/2020 in [facility] Cardiac and Pulmonary Rehab  Referring Provider [name] MD      Initial Encounter Date:    Pulmonary Rehab from 03/18/2020 in [facility] Cardiac and Pulmonary Rehab  Date 03/18/20      Visit Diagnosis: Shortness of breath  Patient's Home Medications on Admission:  Current Outpatient Medications:  .  acetaZOLAMIDE (DIAMOX) 250 MG tablet, Take 250 mg by mouth daily. , Disp: , Rfl:  .  allopurinol (ZYLOPRIM) 300 MG tablet, Take 300 mg by mouth daily. , Disp: , Rfl:  .  amitriptyline (ELAVIL) 10 MG tablet, Take 10 mg by mouth at bedtime. , Disp: , Rfl:  .  atorvastatin (LIPITOR) 10 MG tablet, Take 10 mg by mouth daily., Disp: , Rfl:  .  b complex vitamins capsule, Take 1 capsule by mouth daily. , Disp: , Rfl:  .  betamethasone dipropionate (DIPROLENE) 0.05 % ointment, Apply 1 application topically as needed., Disp: , Rfl:  .  Calcium Carbonate-Vitamin D 600-400 MG-UNIT tablet, Take 1 tablet by mouth daily. , Disp: , Rfl:  .  Cholecalciferol (VITAMIN D) 125 MCG (5000 UT) CAPS, Take 5,000 Units by mouth daily., Disp: , Rfl:  .  EPINEPHrine 0.3 mg/0.3 mL IJ SOAJ injection, Inject 0.3 mLs into the muscle as needed., Disp: , Rfl:  .  Exemestane (AROMASIN PO), Take 25 mg by mouth daily., Disp: , Rfl:  .  furosemide (LASIX) 40 MG tablet, Take 40 mg by mouth 2 (two) times daily.  (Patient not taking: Reported on 03/11/2020), Disp: , Rfl:  .  gabapentin (NEURONTIN) 600 MG tablet, Take 600 mg by mouth 2 (two) times daily. , Disp: , Rfl:  .  magnesium oxide (MAG-OX) 400 MG tablet, Take by mouth., Disp: , Rfl:  .  metFORMIN (GLUCOPHAGE-XR) 500 MG 24 hr tablet, Take 500 mg by mouth 2 (two) times daily. , Disp: , Rfl:  .  Multiple Vitamin (MULTIVITAMIN) capsule, Take 1 capsule by mouth daily. , Disp: , Rfl:  .  mupirocin  ointment (BACTROBAN) 2 %, Apply two times a day for 7 days., Disp: 22 g, Rfl: 0 .  traMADol (ULTRAM) 50 MG tablet, Take 1 tablet (50 mg total) by mouth every 6 (six) hours as needed for severe pain., Disp: 120 tablet, Rfl: 2 .  verapamil (CALAN-SR) 240 MG CR tablet, Take 240 mg by mouth daily. , Disp: , Rfl:   Past Medical History: Past Medical History:  Diagnosis Date  . COPD (chronic obstructive pulmonary disease) ([place])   . Depression 1974  . Diabetes mellitus without complication ([place])   . Hypertension   . Migraine   . 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 Packs/day: 3.00  . Years: 27.00  . Pack years: 81.00  . Types: Cigarettes  . Quit date: 03/15/1991  . Years since quitting: 29.1  Smokeless Tobacco Never Used    Labs: Recent Review Flowsheet Data   There is no flowsheet data to display.      Pulmonary Assessment Scores:  Pulmonary Assessment Scores    Row Name 03/18/20 1108         ADL UCSD   ADL Phase Entry     SOB Score total 64     Rest 1  Walk 2     Stairs 5     Bath 0     Dress 2     Shop 4       CAT Score   CAT Score 24       mMRC Score   mMRC Score 2            UCSD: Self-administered rating of dyspnea associated with activities of daily living (ADLs) 6-point scale (0 = "not at all" to 5 = "maximal or unable to do because of breathlessness")  Scoring Scores range from 0 to 120.  Minimally important difference is 5 units  CAT: CAT can identify the health impairment of COPD patients and is better correlated with disease progression.  CAT has a scoring range of zero to 40. The CAT score is classified into four groups of low (less than 10), medium (10 - 20), high (21-30) and very high (31-40) based on the impact level of disease on health status. A CAT score over 10 suggests significant symptoms.  A worsening CAT score could be explained by an exacerbation, poor  medication adherence, poor inhaler technique, or progression of COPD or comorbid conditions.  CAT MCID is 2 points  mMRC: mMRC (Modified Medical Research Council) Dyspnea Scale is used to assess the degree of baseline functional disability in patients of respiratory disease due to dyspnea. No minimal important difference is established. A decrease in score of 1 point or greater is considered a positive change.   Pulmonary Function Assessment:   Exercise Target Goals: Exercise Program Goal: Individual exercise prescription set using results from initial 6 min walk test and THRR while considering  patient's activity barriers and safety.   Exercise Prescription Goal: Initial exercise prescription builds to 30-45 minutes a day of aerobic activity, 2-3 days per week.  Home exercise guidelines will be given to patient during program as part of exercise prescription that the participant will acknowledge.  Education: Aerobic Exercise & Resistance Training: - Gives group verbal and written instruction on the various components of exercise. Focuses on aerobic and resistive training programs and the benefits of this training and how to safely progress through these programs..   Education: Exercise & Equipment Safety: - Individual verbal instruction and demonstration of equipment use and safety with use of the equipment.   Pulmonary Rehab from 04/15/2020 in [facility] Cardiac and Pulmonary Rehab  Date 03/11/20  Educator [facility]  Instruction Review Code 1- Verbalizes Understanding      Education: Exercise Physiology & General Exercise Guidelines: - Group verbal and written instruction with models to review the exercise physiology of the cardiovascular system and associated critical values. Provides general exercise guidelines with specific guidelines to those with heart or lung disease.    Pulmonary Rehab from 07/19/2018 in [facility] Cardiac and Pulmonary Rehab  Date 06/05/18  Educator [facility]  Instruction Review Code  1- Verbalizes Understanding      Education: Flexibility, Balance, Mind/Body Relaxation: Provides group verbal/written instruction on the benefits of flexibility and balance training, including mind/body exercise modes such as yoga, pilates and tai chi.  Demonstration and skill practice provided.   Pulmonary Rehab from 07/19/2018 in [facility] Cardiac and Pulmonary Rehab  Date 07/03/18  Educator [facility]  Instruction Review Code 1- Verbalizes Understanding      Activity Barriers & Risk Stratification:  Activity Barriers & Cardiac Risk Stratification - 03/18/20 1036      Activity Barriers & Cardiac Risk Stratification   Activity Barriers Balance Concerns;Muscular Weakness;Deconditioning;Shortness of Breath;Back Problems  6 Minute Walk:  6 Minute Walk    Row Name 03/18/20 1026         6 Minute Walk   Phase Initial     Distance 700 feet     Walk Time 5.05 minutes     # of Rest Breaks 0     MPH 1.57     METS 1.64     RPE 15     Perceived Dyspnea  4     VO2 Peak 5.75     Symptoms Yes (comment)     Comments SOB     Resting HR 66 bpm     Resting BP 124/64     Resting Oxygen Saturation  94 %     Exercise Oxygen Saturation  during 6 min walk 83 %     Max Ex. HR 96 bpm     Max Ex. BP 154/70     2 Minute Post BP 146/74       Interval HR   1 Minute HR 81     2 Minute HR 88     3 Minute HR 94     4 Minute HR 88     5 Minute HR 88     6 Minute HR 96     2 Minute Post HR 73     Interval Heart Rate? Yes       Interval Oxygen   Interval Oxygen? Yes     Baseline Oxygen Saturation % 94 %     1 Minute Oxygen Saturation % 91 %     1 Minute Liters of Oxygen 3 L     2 Minute Oxygen Saturation % 85 %     2 Minute Liters of Oxygen 3 L     3 Minute Oxygen Saturation % 84 %  at 3:27 83%     3 Minute Liters of Oxygen 3 L     4 Minute Oxygen Saturation % 86 %     4 Minute Liters of Oxygen 3 L     5 Minute Oxygen Saturation % 89 %     5 Minute Liters of Oxygen 3 L     6 Minute  Oxygen Saturation % 87 %     6 Minute Liters of Oxygen 3 L     2 Minute Post Oxygen Saturation % 95 %     2 Minute Post Liters of Oxygen 3 L           Oxygen Initial Assessment:  Oxygen Initial Assessment - 03/11/20 1037      Home Oxygen   Home Oxygen Device Home Concentrator;E-Tanks    Sleep Oxygen Prescription Continuous    Liters per minute 1    Home Exercise Oxygen Prescription Continuous    Liters per minute 2    Home at Rest Exercise Oxygen Prescription Continuous    Liters per minute 1    Compliance with Home Oxygen Use Yes      Initial 6 min Walk   Oxygen Used Continuous    Liters per minute 2      Program Oxygen Prescription   Program Oxygen Prescription Continuous    Liters per minute 2      Intervention   Short Term Goals To learn and exhibit compliance with exercise, home and travel O2 prescription;To learn and understand importance of monitoring SPO2 with pulse oximeter and demonstrate accurate use of the pulse oximeter.;To learn and understand importance of maintaining oxygen saturations>88%;To learn  and demonstrate proper pursed lip breathing techniques or other breathing techniques.;To learn and demonstrate proper use of respiratory medications    Long  Term Goals Exhibits compliance with exercise, home and travel O2 prescription;Maintenance of O2 saturations>88%;Verbalizes importance of monitoring SPO2 with pulse oximeter and return demonstration;Exhibits proper breathing techniques, such as pursed lip breathing or other method taught during program session;Compliance with respiratory medication;Demonstrates proper use of MDI's           Oxygen Re-Evaluation:  Oxygen Re-Evaluation    Row Name 04/06/20 0935             Program Oxygen Prescription   Program Oxygen Prescription Continuous       Liters per minute 2         Home Oxygen   Home Oxygen Device Home Concentrator;E-Tanks       Sleep Oxygen Prescription Continuous       Liters per minute 2        Home Exercise Oxygen Prescription Continuous       Liters per minute 2       Home at Rest Exercise Oxygen Prescription Continuous       Liters per minute 1       Compliance with Home Oxygen Use Yes         Goals/Expected Outcomes   Short Term Goals To learn and exhibit compliance with exercise, home and travel O2 prescription;To learn and understand importance of monitoring SPO2 with pulse oximeter and demonstrate accurate use of the pulse oximeter.;To learn and understand importance of maintaining oxygen saturations>88%;To learn and demonstrate proper pursed lip breathing techniques or other breathing techniques.;To learn and demonstrate proper use of respiratory medications       Long  Term Goals Exhibits compliance with exercise, home and travel O2 prescription;Maintenance of O2 saturations>88%;Verbalizes importance of monitoring SPO2 with pulse oximeter and return demonstration;Exhibits proper breathing techniques, such as pursed lip breathing or other method taught during program session;Compliance with respiratory medication;Demonstrates proper use of MDI's       Comments Joey is really struggling with her breathing and desaturating when she walks.  We talked about PLB and talking to the doctor to increase her liter flow.       Goals/Expected Outcomes Short: Talk to doctor to see if she can increase her liter flow.  Long; Continue to work on PLB.              Oxygen Discharge (Final Oxygen Re-Evaluation):  Oxygen Re-Evaluation - 04/06/20 0935      Program Oxygen Prescription   Program Oxygen Prescription Continuous    Liters per minute 2      Home Oxygen   Home Oxygen Device Home Concentrator;E-Tanks    Sleep Oxygen Prescription Continuous    Liters per minute 2    Home Exercise Oxygen Prescription Continuous    Liters per minute 2    Home at Rest Exercise Oxygen Prescription Continuous    Liters per minute 1    Compliance with Home Oxygen Use Yes      Goals/Expected  Outcomes   Short Term Goals To learn and exhibit compliance with exercise, home and travel O2 prescription;To learn and understand importance of monitoring SPO2 with pulse oximeter and demonstrate accurate use of the pulse oximeter.;To learn and understand importance of maintaining oxygen saturations>88%;To learn and demonstrate proper pursed lip breathing techniques or other breathing techniques.;To learn and demonstrate proper use of respiratory medications    Long  Term Goals Exhibits compliance  with exercise, home and travel O2 prescription;Maintenance of O2 saturations>88%;Verbalizes importance of monitoring SPO2 with pulse oximeter and return demonstration;Exhibits proper breathing techniques, such as pursed lip breathing or other method taught during program session;Compliance with respiratory medication;Demonstrates proper use of MDI's    Comments Meloney is really struggling with her breathing and desaturating when she walks.  We talked about PLB and talking to the doctor to increase her liter flow.    Goals/Expected Outcomes Short: Talk to doctor to see if she can increase her liter flow.  Long; Continue to work on PLB.           Initial Exercise Prescription:  Initial Exercise Prescription - 03/18/20 1000      Date of Initial Exercise RX and Referring Provider   Date 03/18/20    Referring Provider [name] MD      Oxygen   Oxygen Continuous    Liters 3-4      Treadmill   MPH 1.5    Grade 0    Minutes 15    METs 2.15      NuStep   Level 1    SPM 80    Minutes 15    METs 1.6      REL-XR   Level 1    Speed 50    Minutes 15    METs 1.6      Prescription Details   Frequency (times per week) 2    Duration Progress to 30 minutes of continuous aerobic without signs/symptoms of physical distress      Intensity   THRR 40-80% of Max Heartrate 100-134    Ratings of Perceived Exertion 11-13    Perceived Dyspnea 0-4      Progression   Progression Continue to  progress workloads to maintain intensity without signs/symptoms of physical distress.      Resistance Training   Training Prescription Yes    Weight 3 lb    Reps 10-15           Perform Capillary Blood Glucose checks as needed.  Exercise Prescription Changes:  Exercise Prescription Changes    Row Name 03/18/20 1000 04/06/20 1300 04/20/20 1500         Response to Exercise   Blood Pressure (Admit) 124/64 132/62 124/60     Blood Pressure (Exercise) 154/70 138/62 138/64     Blood Pressure (Exit) 136/70 128/62 150/90     Heart Rate (Admit) 66 bpm 72 bpm 82 bpm     Heart Rate (Exercise) 96 bpm 87 bpm 92 bpm     Heart Rate (Exit) 67 bpm 74 bpm 79 bpm     Oxygen Saturation (Admit) 94 % 92 % 94 %     Oxygen Saturation (Exercise) 83 % 86 % 90 %     Oxygen Saturation (Exit) 94 % 92 % 94 %     Rating of Perceived Exertion (Exercise) 15 12 13      Perceived Dyspnea (Exercise) 4 1 2      Symptoms SOB none none     Comments walk test results -- --     Duration -- Progress to 30 minutes of  aerobic without signs/symptoms of physical distress Continue with 30 min of aerobic exercise without signs/symptoms of physical distress.     Intensity -- THRR unchanged THRR unchanged       Progression   Progression -- -- Continue to progress workloads to maintain intensity without signs/symptoms of physical distress.     Average METs -- --  2.18       Resistance Training   Training Prescription -- Yes Yes     Weight -- 3 lb 3 lb     Reps -- 10-15 10-15       Interval Training   Interval Training -- -- No       Oxygen   Oxygen -- Continuous Continuous     Liters -- 3-4 3-4       Treadmill   MPH -- -- 1.5     Grade -- -- 0     Minutes -- -- 15     METs -- -- 2.15       Recumbant Bike   Level -- 1 1     RPM -- 60 --     Minutes -- 15 15     METs -- 2.85 --       NuStep   Level -- 2 4     SPM -- 80 --     Minutes -- 15 15     METs -- 2.3 2.2       T5 Nustep   Level -- -- 1      Minutes -- -- 15            Exercise Comments:   Exercise Goals and Review:  Exercise Goals    Row Name 03/18/20 1053             Exercise Goals   Increase Physical Activity Yes       Intervention Provide advice, education, support and counseling about physical activity/exercise needs.;Develop an individualized exercise prescription for aerobic and resistive training based on initial evaluation findings, risk stratification, comorbidities and participant's personal goals.       Expected Outcomes Short Term: Attend rehab on a regular basis to increase amount of physical activity.;Long Term: Add in home exercise to make exercise part of routine and to increase amount of physical activity.;Long Term: Exercising regularly at least 3-5 days a week.       Increase Strength and Stamina Yes       Intervention Provide advice, education, support and counseling about physical activity/exercise needs.;Develop an individualized exercise prescription for aerobic and resistive training based on initial evaluation findings, risk stratification, comorbidities and participant's personal goals.       Expected Outcomes Short Term: Increase workloads from initial exercise prescription for resistance, speed, and METs.;Short Term: Perform resistance training exercises routinely during rehab and add in resistance training at home;Long Term: Improve cardiorespiratory fitness, muscular endurance and strength as measured by increased METs and functional capacity (6MWT)       Able to understand and use rate of perceived exertion (RPE) scale Yes       Intervention Provide education and explanation on how to use RPE scale       Expected Outcomes Short Term: Able to use RPE daily in rehab to express subjective intensity level;Long Term:  Able to use RPE to guide intensity level when exercising independently       Able to understand and use Dyspnea scale Yes       Intervention Provide education and explanation on how to  use Dyspnea scale       Expected Outcomes Short Term: Able to use Dyspnea scale daily in rehab to express subjective sense of shortness of breath during exertion;Long Term: Able to use Dyspnea scale to guide intensity level when exercising independently       Knowledge and understanding of Target Heart Rate Range (  THRR) Yes       Intervention Provide education and explanation of THRR including how the numbers were predicted and where they are located for reference       Expected Outcomes Short Term: Able to state/look up THRR;Short Term: Able to use daily as guideline for intensity in rehab;Long Term: Able to use THRR to govern intensity when exercising independently       Able to check pulse independently Yes       Intervention Provide education and demonstration on how to check pulse in carotid and radial arteries.;Review the importance of being able to check your own pulse for safety during independent exercise       Expected Outcomes Short Term: Able to explain why pulse checking is important during independent exercise;Long Term: Able to check pulse independently and accurately       Understanding of Exercise Prescription Yes       Intervention Provide education, explanation, and written materials on patient's individual exercise prescription       Expected Outcomes Short Term: Able to explain program exercise prescription;Long Term: Able to explain home exercise prescription to exercise independently              Exercise Goals Re-Evaluation :  Exercise Goals Re-Evaluation    [place] Name 04/06/20 1339 04/20/20 1521           Exercise Goal Re-Evaluation   Exercise Goals Review Increase Physical Activity;Increase Strength and Stamina Increase Physical Activity;Increase Strength and Stamina;Understanding of Exercise Prescription      Comments Reviewed RPE and dyspnea scales, THR and program prescription with pt today.  Pt voiced understanding and was given a copy of goals to take home. Doralene  has been doing well in rehab.  She has now started her cancer treatments and infusions, first was yesterday.  She called out today, but we will take each day as she comes and work on maintaining progress.      Expected Outcomes Short: Use RPE daily to regulate intensity. Long: Follow program prescription in THR. Short: Attend rehab regularly Long; Continue to maintain stamina.             Discharge Exercise Prescription (Final Exercise Prescription Changes):  Exercise Prescription Changes - 04/20/20 1500      Response to Exercise   Blood Pressure (Admit) 124/60    Blood Pressure (Exercise) 138/64    Blood Pressure (Exit) 150/90    Heart Rate (Admit) 82 bpm    Heart Rate (Exercise) 92 bpm    Heart Rate (Exit) 79 bpm    Oxygen Saturation (Admit) 94 %    Oxygen Saturation (Exercise) 90 %    Oxygen Saturation (Exit) 94 %    Rating of Perceived Exertion (Exercise) 13    Perceived Dyspnea (Exercise) 2    Symptoms none    Duration Continue with 30 min of aerobic exercise without signs/symptoms of physical distress.    Intensity THRR unchanged      Progression   Progression Continue to progress workloads to maintain intensity without signs/symptoms of physical distress.    Average METs 2.18      Resistance Training   Training Prescription Yes    Weight 3 lb    Reps 10-15      Interval Training   Interval Training No      Oxygen   Oxygen Continuous    Liters 3-4      Treadmill   MPH 1.5    Grade 0  Minutes 15    METs 2.15      Recumbant Bike   Level 1    Minutes 15      NuStep   Level 4    Minutes 15    METs 2.2      T5 Nustep   Level 1    Minutes 15           Nutrition:  Target Goals: Understanding of nutrition guidelines, daily intake of sodium <1554m, cholesterol <2069m calories 30% from fat and 7% or less from saturated fats, daily to have 5 or more servings of fruits and vegetables.  Education: Controlling Sodium/Reading Food Labels -Group verbal  and written material supporting the discussion of sodium use in heart healthy nutrition. Review and explanation with models, verbal and written materials for utilization of the food label.   [facility]  Date 05/29/18  Educator [facility]  Instruction Review Code 1- Verbalizes Understanding      Education: General Nutrition Guidelines/Fats and Fiber: -Group instruction provided by verbal, written material, models and posters to present the general guidelines for heart healthy nutrition. Gives an explanation and review of dietary fats and fiber.   [facility]  Date 05/22/18  Educator [facility]  Instruction Review Code 1- Verbalizes Understanding      Biometrics:  Pre Biometrics - 03/18/20 1054      Pre Biometrics   Height 5' 5"  (1.651 m)    Weight 203 lb 14.4 oz (92.5 kg)    BMI (Calculated) 33.93    Single Leg Stand 5.88 seconds            Nutrition Therapy Plan and Nutrition Goals:  Nutrition Therapy & Goals - 04/13/20 0926      Nutrition Therapy   Diet High kcal, High Protein    Protein (specify units) 110-115g (COPD and current cancer treatment with recent 20 lb unintentional weight loss)    Fiber 25 grams    Whole Grain Foods 2 servings    Saturated Fats 12 max. grams    Fruits and Vegetables 5 servings/day    Sodium 1.5 grams      Personal Nutrition Goals   Nutrition Goal ST: eat small frequent meals, add fat to meals she is already eating, eat high quality protein at each meal LT: Maintain health and weight going through chemo    Comments Lung Cancer starting chemo 8/9 and immunotherapy. Skips breakfast - not hungry and nothing is appealing- may have some cereal or some fruit and cool whip. Starts feeling like eaitng around 11am. Discussed high calorie and high protein nutrition therapy and discussed expectations for cancer treatment. Discussed T2DM nutrition therapy. Pt  does not have an RD following her for cancer treatment - encouraged her to have an Rd to follow her - she is being treated with [facility] and [facility] - will contact [facility].      Intervention Plan   Intervention Prescribe, educate and counsel regarding individualized specific dietary modifications aiming towards targeted core components such as weight, hypertension, lipid management, diabetes, heart failure and other comorbidities.;Nutrition handout(s) given to patient.    Expected Outcomes Short Term Goal: Understand basic principles of dietary content, such as calories, fat, sodium, cholesterol and nutrients.;Short Term Goal: A plan has been developed with personal nutrition goals set during dietitian appointment.;Long Term Goal: Adherence to prescribed nutrition plan.  Nutrition Assessments:  Nutrition Assessments - 03/30/20 1111      MEDFICTS Scores   Pre Score 15           MEDIFICTS Score Key:          ?70 Need to make dietary changes          40-70 Heart Healthy Diet         ? 40 Therapeutic Level Cholesterol Diet  Nutrition Goals Re-Evaluation:   Nutrition Goals Discharge (Final Nutrition Goals Re-Evaluation):   Psychosocial: Target Goals: Acknowledge presence or absence of significant depression and/or stress, maximize coping skills, provide positive support system. Participant is able to verbalize types and ability to use techniques and skills needed for reducing stress and depression.   Education: Depression - Provides group verbal and written instruction on the correlation between heart/lung disease and depressed mood, treatment options, and the stigmas associated with seeking treatment.   Pulmonary Rehab from 04/15/2020 in [facility] Cardiac and Pulmonary Rehab  Date 04/15/20  Educator SB  Instruction Review Code 1- [employer] Understanding      Education: Sleep Hygiene -Provides group verbal and written instruction about how sleep can affect your health.   Define sleep hygiene, discuss sleep cycles and impact of sleep habits. Review good sleep hygiene tips.    Pulmonary Rehab from 07/19/2018 in [facility] Cardiac and Pulmonary Rehab  Date 05/15/18  Educator [facility]  Instruction Review Code 1- Verbalizes Understanding      Education: Stress and Anxiety: - Provides group verbal and written instruction about the health risks of elevated stress and causes of high stress.  Discuss the correlation between heart/lung disease and anxiety and treatment options. Review healthy ways to manage with stress and anxiety.   Pulmonary Rehab from 04/15/2020 in [facility] Cardiac and Pulmonary Rehab  Date 04/15/20  Educator SB  Instruction Review Code 1- Verbalizes Understanding      Initial Review & Psychosocial Screening:  Initial Psych Review & Screening - 03/11/20 1040      Initial Review   Current issues with Current Stress Concerns    Source of Stress Concerns Chronic Illness    Comments Caryn has recently been diagnosed with Lung Cancer and is a big factor in her stress. She feels like she is handling her diagnosis well.      Family Dynamics   Good Support System? Yes    Comments Johnisha can talk to her sister in [place] for support.      Barriers   Psychosocial barriers to participate in program The patient should benefit from training in stress management and relaxation.      Screening Interventions   Interventions To provide support and resources with identified psychosocial needs;Encouraged to exercise;Provide feedback about the scores to participant    Expected Outcomes Short Term goal: Utilizing psychosocial counselor, staff and physician to assist with identification of specific Stressors or current issues interfering with healing process. Setting desired goal for each stressor or current issue identified.;Long Term Goal: Stressors or current issues are controlled or eliminated.;Short Term goal: Identification and review with participant of any Quality of  Life or Depression concerns found by scoring the questionnaire.;Long Term goal: The participant improves quality of Life and PHQ9 Scores as seen by post scores and/or verbalization of changes           Quality of Life Scores:  Scores of 19 and below usually indicate a poorer quality of life in these areas.  A difference of  2-3 points is  a clinically meaningful difference.  A difference of 2-3 points in the total score of the Quality of Life Index has been associated with significant improvement in overall quality of life, self-image, physical symptoms, and general health in studies assessing change in quality of life.  PHQ-9: Recent Review Flowsheet Data    Depression screen [facility] 2/9 04/06/2020 03/18/2020 12/02/2018 10/10/2018 08/12/2018   Decreased Interest 2 1 0 0 0   Down, Depressed, Hopeless 3 1 0 0 0   PHQ - 2 Score 5 2 0 0 0   Altered sleeping 3 0 - - -   Tired, decreased energy 3 2 - - -   Change in appetite 3 2 - - -   Feeling bad or failure about yourself  0 0 - - -   Trouble concentrating 1 0 - - -   Moving slowly or fidgety/restless 0 0 - - -   Suicidal thoughts 0 0 - - -   PHQ-9 Score 15 6 - - -   Difficult doing work/chores Somewhat difficult Somewhat difficult - - -     Interpretation of Total Score  Total Score Depression Severity:  1-4 = Minimal depression, 5-9 = Mild depression, 10-14 = Moderate depression, 15-19 = Moderately severe depression, 20-27 = Severe depression   Psychosocial Evaluation and Intervention:  Psychosocial Evaluation - 03/11/20 1043      Psychosocial Evaluation & Interventions   Interventions Encouraged to exercise with the program and follow exercise prescription    Comments Ethelean has recently been diagnosed with Lung Cancer and is a big factor in her stress. She feels like she is handling her diagnosis well.    Expected Outcomes Short: Exercise regularly to support mental health and notify staff of any changes. Long: Maintain mental health and  well being through teaching of rehab or prescribed medications independently.    Continue Psychosocial Services  Follow up required by staff           Psychosocial Re-Evaluation:  Psychosocial Re-Evaluation    [place] Name 04/06/20 [phone]             Psychosocial Re-Evaluation   Current issues with Current Stress Concerns       Comments Her brain CT scan was clear, but now awaiting on results for lungs; her appt is on Wednesday.  Her PHQ has gotten worse over the last month with her cancer and we talked about mentioning this to doctor to titrate meds.  She is sleeping okay, but her appeptitie is wanning as is her energy levels.       Expected Outcomes Short: Get results and talk to doctor.  Long; Conitnue to focus on positive and exercise for mental boost.       Interventions Encouraged to attend Pulmonary Rehabilitation for the exercise       Continue Psychosocial Services  Follow up required by staff              Psychosocial Discharge (Final Psychosocial Re-Evaluation):  Psychosocial Re-Evaluation - 04/06/20 0936      Psychosocial Re-Evaluation   Current issues with Current Stress Concerns    Comments Her brain CT scan was clear, but now awaiting on results for lungs; her appt is on Wednesday.  Her PHQ has gotten worse over the last month with her cancer and we talked about mentioning this to doctor to titrate meds.  She is sleeping okay, but her appeptitie is wanning as is her energy levels.    Expected Outcomes  Short: Get results and talk to doctor.  Long; Conitnue to focus on positive and exercise for mental boost.    Interventions Encouraged to attend Pulmonary Rehabilitation for the exercise    Continue Psychosocial Services  Follow up required by staff           Education: Education Goals: Education classes will be provided on a weekly basis, covering required topics. Participant will state understanding/return demonstration of topics presented.  Learning  Barriers/Preferences:  Learning Barriers/Preferences - 03/11/20 1040      Learning Barriers/Preferences   Learning Barriers Sight    Learning Preferences None           General Pulmonary Education Topics:  Infection Prevention: - Provides verbal and written material to individual with discussion of infection control including proper hand washing and proper equipment cleaning during exercise session.   Pulmonary Rehab from 04/15/2020 in [facility] Cardiac and Pulmonary Rehab  Date 03/11/20  Educator [facility]  Instruction Review Code 1- Verbalizes Understanding      Falls Prevention: - Provides verbal and written material to individual with discussion of falls prevention and safety.   Pulmonary Rehab from 04/15/2020 in [facility] Cardiac and Pulmonary Rehab  Date 03/11/20  Educator [facility]  Instruction Review Code 1- Verbalizes Understanding      Chronic Lung Diseases: - Group verbal and written instruction to review updates, respiratory medications, advancements in procedures and treatments. Discuss use of supplemental oxygen including available portable oxygen systems, continuous and intermittent flow rates, concentrators, personal use and safety guidelines. Review proper use of inhaler and spacers. Provide informative websites for self-education.    Pulmonary Rehab from 07/19/2018 in [facility] Cardiac and Pulmonary Rehab  Date 05/24/18  Educator [facility]  Instruction Review Code 1- Verbalizes Understanding      Energy Conservation: - Provide group verbal and written instruction for methods to conserve energy, plan and organize activities. Instruct on pacing techniques, use of adaptive equipment and posture/positioning to relieve shortness of breath.   Pulmonary Rehab from 07/19/2018 in [facility] Cardiac and Pulmonary Rehab  Date 07/10/18  Educator [facility]  Instruction Review Code 1- Verbalizes Understanding      Triggers and Exacerbations: - Group verbal and written instruction to review types of environmental  triggers and ways to prevent exacerbations. Discuss weather changes, air quality and the benefits of nasal washing. Review warning signs and symptoms to help prevent infections. Discuss techniques for effective airway clearance, coughing, and vibrations.   Pulmonary Rehab from 07/19/2018 in [facility] Cardiac and Pulmonary Rehab  Date 06/12/18  Educator [facility]  Instruction Review Code 1- Verbalizes Understanding      AED/CPR: - Group verbal and written instruction with the use of models to demonstrate the basic use of the AED with the basic ABC's of resuscitation.   Pulmonary Rehab from 07/19/2018 in [facility] Cardiac and Pulmonary Rehab  Date 07/19/18  Educator [facility]  Instruction Review Code 1- Actuary and Physiology of the Lungs: - Group verbal and written instruction with the use of models to provide basic lung anatomy and physiology related to function, structure and complications of lung disease.   Pulmonary Rehab from 07/19/2018 in [facility] Cardiac and Pulmonary Rehab  Date 05/08/18  Educator [facility]  Instruction Review Code 1- Verbalizes Understanding      Anatomy & Physiology of the Heart: - Group verbal and written instruction and models provide basic cardiac anatomy and physiology, with the coronary electrical and arterial systems. Review of Valvular disease and Heart  Failure   Pulmonary Rehab from 07/19/2018 in [facility] Cardiac and Pulmonary Rehab  Date 06/21/18  Educator [facility]  Instruction Review Code 1- Verbalizes Understanding      Cardiac Medications: - Group verbal and written instruction to review commonly prescribed medications for heart disease. Reviews the medication, class of the drug, and side effects.   Pulmonary Rehab from 04/15/2020 in [facility] Cardiac and Pulmonary Rehab  Date 04/08/20  Educator SB  Instruction Review Code 1- Verbalizes Understanding      Other: -Provides group and verbal instruction on various topics (see comments)   Knowledge  Questionnaire Score:  Knowledge Questionnaire Score - 03/18/20 1055      Knowledge Questionnaire Score   Pre Score 16/18 Education: O2 Safety            Core Components/Risk Factors/Patient Goals at Admission:  Personal Goals and Risk Factors at Admission - 03/18/20 1055      Core Components/Risk Factors/Patient Goals on Admission    Weight Management Yes;Weight Loss;Obesity    Intervention Weight Management: Develop a combined nutrition and exercise program designed to reach desired caloric intake, while maintaining appropriate intake of nutrient and fiber, sodium and fats, and appropriate energy expenditure required for the weight goal.;Weight Management: Provide education and appropriate resources to help participant work on and attain dietary goals.;Weight Management/Obesity: Establish reasonable short term and long term weight goals.;Obesity: Provide education and appropriate resources to help participant work on and attain dietary goals.    Admit Weight 203 lb 14.4 oz (92.5 kg)    Goal Weight: Short Term 198 lb (89.8 kg)    Goal Weight: Long Term 190 lb (86.2 kg)    Expected Outcomes Short Term: Continue to assess and modify interventions until short term weight is achieved;Long Term: Adherence to nutrition and physical activity/exercise program aimed toward attainment of established weight goal;Weight Loss: Understanding of general recommendations for a balanced deficit meal plan, which promotes 1-2 lb weight loss per week and includes a negative energy balance of [phone] kcal/d;Understanding recommendations for meals to include 15-35% energy as protein, 25-35% energy from fat, 35-60% energy from carbohydrates, less than 226m of dietary cholesterol, 20-35 gm of total fiber daily;Understanding of distribution of calorie intake throughout the day with the consumption of 4-5 meals/snacks    Improve shortness of breath with ADL's Yes    Intervention Provide education, individualized  exercise plan and daily activity instruction to help decrease symptoms of SOB with activities of daily living.    Expected Outcomes Short Term: Improve cardiorespiratory fitness to achieve a reduction of symptoms when performing ADLs;Long Term: Be able to perform more ADLs without symptoms or delay the onset of symptoms    Diabetes Yes    Intervention Provide education about signs/symptoms and action to take for hypo/hyperglycemia.;Provide education about proper nutrition, including hydration, and aerobic/resistive exercise prescription along with prescribed medications to achieve blood glucose in normal ranges: Fasting glucose 65-99 mg/dL    Expected Outcomes Long Term: Attainment of HbA1C < 7%.;Short Term: Participant verbalizes understanding of the signs/symptoms and immediate care of hyper/hypoglycemia, proper foot care and importance of medication, aerobic/resistive exercise and nutrition plan for blood glucose control.    Hypertension Yes    Intervention Provide education on lifestyle modifcations including regular physical activity/exercise, weight management, moderate sodium restriction and increased consumption of fresh fruit, vegetables, and low fat dairy, alcohol moderation, and smoking cessation.;Monitor prescription use compliance.    Expected Outcomes Short Term: Continued assessment and intervention until BP is < 140/933m  HG in hypertensive participants. < 130/9m HG in hypertensive participants with diabetes, heart failure or chronic kidney disease.;Long Term: Maintenance of blood pressure at goal levels.    Lipids Yes    Intervention Provide education and support for participant on nutrition & aerobic/resistive exercise along with prescribed medications to achieve LDL <74m HDL >4084m   Expected Outcomes Short Term: Participant states understanding of desired cholesterol values and is compliant with medications prescribed. Participant is following exercise prescription and nutrition  guidelines.;Long Term: Cholesterol controlled with medications as prescribed, with individualized exercise RX and with personalized nutrition plan. Value goals: LDL < 47m37mDL > 40 mg.           Education:Diabetes - Individual verbal and written instruction to review signs/symptoms of diabetes, desired ranges of glucose level fasting, after meals and with exercise. Acknowledge that pre and post exercise glucose checks will be done for 3 sessions at entry of program.   [facility]  Date 03/11/20  [facility] Code 1- Verbalizes Understanding      Education: Know Your Numbers and Risk Factors: -Group verbal and written instruction about important numbers in your health.  Discussion of what are risk factors and how they play a role in the disease process.  Review of Cholesterol, Blood Pressure, Diabetes, and BMI and the role they play in your overall health.   Core Components/Risk Factors/Patient Goals Review:   Goals and Risk Factor Review    Row Name 04/06/20 0940             Core Components/Risk Factors/Patient Goals Review   Personal Goals Review Weight Management/Obesity;Improve shortness of breath with ADL's;Diabetes;Lipids       Review [place]ing well in rehab.  Her weight has been fluctuating and she is frustrated that she can't do all that she wants to do. Her blood sugars have been doing well and so have her pressures.       Expected Outcomes Short: Continue to work on her weight Long: Continue to manage lung disease.              Core Components/Risk Factors/Patient Goals at Discharge (Final Review):   Goals and Risk Factor Review - 04/06/20 0940      Core Components/Risk Factors/Patient Goals Review   Personal Goals Review Weight Management/Obesity;Improve shortness of breath with ADL's;Diabetes;Lipids    Review [place] well in rehab.  Her weight has been fluctuating and she is  frustrated that she can't do all that she wants to do. Her blood sugars have been doing well and so have her pressures.    Expected Outcomes Short: Continue to work on her weight Long: Continue to manage lung disease.           ITP Comments:  ITP Comments    Row Name 03/11/20 1045 03/18/20 1023 03/24/20 0634 04/20/20 1521 04/21/20 0543   ITP Comments Virtual Visit completed. Patient informed on EP and RD appointment and 6 Minute walk test. Patient also informed of patient health questionnaires on My Chart. Patient Verbalizes understanding. Visit diagnosis can be found in [facility]. Completed 6MWT and gym orientation. Initial ITP created and sent for review to Dr. MarkEmily Filbertdical Director. 30 Day review completed. Medical Director ITP review done, changes made as directed, and signed approval by Medical Director. LindLatarshaled out today, she started her cancer treatment infusions yesterday. 30 Day review completed. Medical Director ITP review  done, changes made as directed, and signed approval by Medical Director.          Comments:

## 2020-04-22 ENCOUNTER — Ambulatory Visit: Payer: Medicare Other | Admitting: Occupational Therapy

## 2020-04-22 ENCOUNTER — Other Ambulatory Visit: Payer: Self-pay

## 2020-04-22 ENCOUNTER — Telehealth: Payer: Self-pay | Admitting: *Deleted

## 2020-04-22 DIAGNOSIS — I972 Postmastectomy lymphedema syndrome: Secondary | ICD-10-CM | POA: Diagnosis not present

## 2020-04-22 NOTE — Telephone Encounter (Signed)
Pt called to report that she was still feeling fatigued but better. She had spent the day at [facility] yesterday which did not help.  She is going to come to her lymphedema appt today and try for rehab next week.

## 2020-04-22 NOTE — Therapy (Signed)
[place] MAIN [facility] SERVICES [address] [place], [place], [address] Phone: [phone]   Fax:  [phone]  Occupational Therapy Treatment  Patient Details  Name: Susan Callahan MRN: [id number] Date of Birth: 07/15/1951 No data recorded  Encounter Date: 04/22/2020   OT End of Session - 04/22/20 1515    Visit Number 2    Number of Visits 36    Date for OT Re-Evaluation 07/13/20    Authorization Type new POC requesting 36 additional visits 04/13/20    OT Start Time 0200    OT Stop Time 0300    OT Time Calculation (min) 60 min    Activity Tolerance Patient tolerated treatment well;No increased pain    Behavior During Therapy WFL for tasks assessed/performed           Past Medical History:  Diagnosis Date  . COPD (chronic obstructive pulmonary disease) ([place])   . Depression 1974  . Diabetes mellitus without complication ([place])   . Hypertension   . Migraine   . 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 LUMBAR PERITONEAL SHUNT    . TYMPANOSTOMY TUBE PLACEMENT      There were no vitals filed for this visit.   Subjective Assessment - 04/22/20 1511    Subjective  Pt presents for OT visit 2/36 to address RUE/ RUQ post-mastectomy lymphedema. Pt reports she underwent initial infusion at [facility] earlier this week. So far side effects have been minimal by report. Pt and OT discuss Nadir period, expected in 7-10 days, may impact OT schedul;ing. We can adjust this as needs be. Pt denies LE associated pain or symptoms of cording since last visit.    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 01/30/19 partial mastectomy. Margins clear. 0/2 LN  negative for  disease. actinic keritosis,DDD, DM, obesity, thyroid nodules, pulmonary nodules, chronic back pain, HTN, COPD, hx depression    Limitations pain and swelling in R axilla and arm, chronic back pain,  muscle weakness, impaired balance, decreased RUE AROM, abnormal gait, difficulty walking, impaired functional mobility and transfers,    Repetition Increases Symptoms    Special Tests slight positive Stemmer at R MPs    Patient Stated Goals relduce swelling and pain in my R arm and hand and keep it from getting worse so I can do what I need to do    Pain Onset More than a month ago                        OT Treatments/Exercises (OP) - 04/22/20 0001      ADLs   ADL Education Given Yes      Manual Therapy   Manual Therapy Edema management;Manual Lymphatic Drainage (MLD)    Manual Lymphatic Drainage (MLD) MLD to RUE/RUQ as established using cntalateral AAA    Compression Bandaging No compression garments after session today.                  OT Education - 04/22/20 1515    Education Details Continued skilled Pt/caregiver education  And LE ADL training throughout visit for lymphedema self care/ home program, including compression wrapping, compression garment and device wear/care, lymphatic pumping ther ex, simple self-MLD, and skin care. Discussed progress towards goals.    Person(s) Educated Patient    Methods Demonstration;Explanation    Comprehension Verbalized understanding;Returned demonstration  OT Long Term Goals - 04/14/20 1000      OT LONG TERM GOAL #1   Title Pt modified independent using printed reference  with lymphedema precautions and prevention strategies to limit LE progression.    Baseline min A    Time 12    Period Weeks    Status On-going    Target Date 07/13/20      OT LONG TERM GOAL #2   Title Pt will be modified independent with all lymphedema self-care home program acomponents, including self-MLD ( with Flexitouch device 1 x q day), daily custom compression glove and sleeve, daily skin care and lymphatic pumping ther ex using assistive devices and with extra time in keeping with energy conservation and work  simplification principals.      Baseline Mod A    Time 12    Period Weeks    Status New    Target Date 07/13/20      OT LONG TERM GOAL #3   Title Pt wil report 0/10 pain with RUE functional use ( reaching, lifting, carrying, dressing, dressing, etc) to achieve independent , pain free ADLs performance.    Baseline min A    Time 12    Period Weeks    Status New    Target Date 07/13/20      OT LONG TERM GOAL #4   Title Pt able to don and doff all custom compression garments and devices usiing assistive devices and/or extra time PRN to ensure optimal LE symptom control and to limit progression    Baseline supervision    Time 12    Period Weeks    Status On-going    Target Date 07/13/20      OT LONG TERM GOAL #6   Title Pt will retain all clinical gains achieved during Intensive Phase CDT to limit lLE progression and further functional decline as evidenced by no more than 3% upward volume fluctuation in RUE/RUQ  over time.    Baseline Mod A    Time 12    Period Weeks    Status New    Target Date 07/13/20                 Plan - 04/22/20 1518    Clinical Impression Statement Susan Callahan tolerated MLD to RUE/RUQ today in supine without increased pain. HOB at 30 improved respiration. Increased strokes to mediastinal and spuraclavicular LN. No adenopathy palpable and Pt denied tenderness during MLD ins supine position. Cont as per OC.    OT Occupational Profile and History Comprehensive Assessment- Review of records and extensive additional review of physical, cognitive, psychosocial history related to current functional performance    Occupational performance deficits (Please refer to evaluation for details): ADL's;IADL's;Social Participation;Leisure;Rest and Sleep;Work    Marketing executive / Function / Physical Skills ADL;Decreased knowledge of precautions;ROM;UE functional use;Decreased knowledge of use of DME;Edema;Skin integrity;Pain;IADL;Sensation;Mobility;Strength    Rehab  Potential Good    Clinical Decision Making Several treatment options, min-mod task modification necessary    Comorbidities Affecting Occupational Performance: Presence of comorbidities impacting occupational performance    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 Modify schedule in keeping with burden of cancer care   OT Treatment/Interventions Self-care/ADL training;Therapeutic exercise;Manual lymph drainage;Patient/family education;Therapeutic  activities;DME and/or AE instruction;Manual Therapy;Other (comment);Energy conservation;Coping strategies training   replace custom compression garments PRN. Replace with alternative garments PRN, retrain use of Flexitouch PRN,   Plan lymphatic pumping ther ex; diaphragmatic breathing/ pursed lip breathing, relaxation techniques    Consulted and Agree with Plan of Care Patient           Patient will benefit from skilled therapeutic intervention in order to improve the following deficits and impairments:   Body Structure / Function / Physical Skills: ADL, Decreased knowledge of precautions, ROM, UE functional use, Decreased knowledge of use of DME, Edema, Skin integrity, Pain, IADL, Sensation, Mobility, Strength       Visit Diagnosis: Postmastectomy lymphedema syndrome    Problem List Patient Active Problem List   Diagnosis Date Noted  . Otalgia of left ear 08/11/2019  . Edema 04/16/2019  . Postoperative wound infection 03/13/2019  . Seroma of breast 03/11/2019  . Age-related osteoporosis without current pathological fracture 02/27/2019  . Malignant neoplasm of central portion of right breast in female, estrogen receptor positive ([place]) 02/12/2019  . Genetic testing 01/31/2019  . Malignant neoplasm of  upper-inner quadrant of right breast in female, estrogen receptor positive ([place]) 01/15/2019  . Long term current use of opiate analgesic 12/02/2018  . Tympanic membrane central perforation, left 11/04/2018  . DDD (degenerative disc disease), lumbosacral 10/10/2018  . Abnormal MRI, lumbar spine (08/29/2018) 10/10/2018  . 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 Lumbar nerve root impingement (Bilateral: L4 & L5) (L>R: L5) 10/10/2018  . Lumbar Spinal instability of L4-5 10/10/2018  . Lumbar facet hypertrophy 10/10/2018  . COPD (chronic obstructive pulmonary disease) with emphysema ([place]) 08/28/2018  . Elevated rheumatoid factor 08/13/2018  . Elevated C-reactive protein (CRP) 08/12/2018  . Elevated sed rate 08/12/2018  . Lumbar facet syndrome (Bilateral) 08/12/2018  . Back pain with left-sided radiculopathy 08/12/2018  . DDD (degenerative disc disease), lumbar 08/12/2018  . Chronic lower extremity pain (Primary Area of Pain) (Bilateral) (L>R) 07/23/2018  . Chronic low back pain (Secondary Area of Pain) (Bilateral) (L>R) w/ sciatica (Bilateral) 07/23/2018  . Chronic pain syndrome 07/23/2018  . Pharmacologic therapy 07/23/2018  . Disorder of skeletal system 07/23/2018  . Problems influencing health status 07/23/2018  . Chronic sacroiliac joint pain [facility] Area of Pain) (Bilateral) (L>R) 07/23/2018  . Status post lumboperitoneal shunt placement 02/26/2018  . 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 Tympanic membrane perforation 05/30/2016  . PMB (postmenopausal bleeding) 04/26/2016  . Rash 12/27/2015  . 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 Plantar fasciitis of foot (Left) 05/26/2015  . Spondylosis of lumbar region without myelopathy or radiculopathy 05/26/2015  . Lumbar foraminal stenosis (L3-4, L4-5, L5-S1) (L>R) 05/26/2015  . Spinal stenosis of lumbar region with  neurogenic claudication 05/26/2015  . Encounter for screening mammogram for malignant neoplasm of breast 12/02/2014  . Goiter 12/02/2014  . Chronic idiopathic gout of foot (Right) 07/06/2014  . Biceps tendonitis (Right) 05/19/2014  . Shoulder pain (Right) 05/19/2014  . Facial weakness 05/05/2014  . Post herpetic neuralgia 05/05/2014  . Chronic dysfunction of left eustachian tube 03/24/2014  . Osteopenia 03/13/2014  . Lumbar Grade 1 Anterolisthesis of L3/4 (58mm) & L4/5 (4-53mm)(w/ Dynamic instability) 03/13/2014  . Family history of colon cancer 12/02/2013  . Dyshidrotic eczema 11/17/2013  . Diastasis recti 04/08/2013  . Facial nerve paresis 07/31/2012  . Blepharospasm 05/08/2012  . Conductive hearing loss of left ear with  unrestricted hearing of right ear 04/08/2012  . Obesity 03/28/2012  . Hemoptysis 12/22/2011  . Neuropathic postherpetic trigeminal neuralgia 12/12/2011  . Allergic rhinitis, mild 10/12/2011  . Disequilibrium syndrome 09/27/2011  . Chronic bronchitis ([place]) 06/05/2011  . Essential hypertension 05/24/2011  . Type 2 diabetes mellitus without complications ([place]) 37/35/7897  . Pulmonary nodules 05/05/2011    Andrey Spearman, Susan, OTR/L, [facility] 04/22/20 3:21 PM  [place] MAIN [facility] SERVICES [address] [place], [place], [address] Phone: [phone]   Fax:  [phone]  Name: Susan Callahan MRN: [id number] Date of Birth: [birth year]

## 2020-04-23 DIAGNOSIS — C3490 Malignant neoplasm of unspecified part of unspecified bronchus or lung: Secondary | ICD-10-CM | POA: Insufficient documentation

## 2020-04-29 ENCOUNTER — Encounter: Payer: Medicare Other | Admitting: *Deleted

## 2020-04-29 ENCOUNTER — Other Ambulatory Visit: Payer: Self-pay

## 2020-04-29 ENCOUNTER — Ambulatory Visit: Payer: Medicare Other | Admitting: Occupational Therapy

## 2020-04-29 DIAGNOSIS — R0602 Shortness of breath: Secondary | ICD-10-CM

## 2020-04-29 DIAGNOSIS — I972 Postmastectomy lymphedema syndrome: Secondary | ICD-10-CM | POA: Diagnosis not present

## 2020-04-29 NOTE — Therapy (Signed)
[place] MAIN [facility] SERVICES [address] [place], [place], [address] Phone: [phone]   Fax:  [phone]  Occupational Therapy Treatment  Patient Details  Name: Susan Callahan MRN: [id number] Date of Birth: 09/12/1950 No data recorded  Encounter Date: 04/29/2020   OT End of Session - 04/29/20 1636    Visit Number 3    Number of Visits 36    Date for OT Re-Evaluation 07/13/20    Authorization Type new POC requesting 36 additional visits 04/13/20    OT Start Time 0300    OT Stop Time 0415    OT Time Calculation (min) 75 min    Activity Tolerance Patient tolerated treatment well;No increased pain    Behavior During Therapy WFL for tasks assessed/performed           Past Medical History:  Diagnosis Date  . COPD (chronic obstructive pulmonary disease) ([place])   . Depression 1974  . Diabetes mellitus without complication ([place])   . Hypertension   . Migraine   . 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 LUMBAR PERITONEAL SHUNT    . TYMPANOSTOMY TUBE PLACEMENT      There were no vitals filed for this visit.   Subjective Assessment - 04/29/20 1629    Subjective  Pt presents for OT visit 3/36 to address RUE/ RUQ post-mastectomy lymphedema. Pt reports she is feeling better today than she has since initial chemo therapy.    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 01/30/19 partial mastectomy. Margins clear. 0/2 LN  negative for  disease. actinic keritosis,DDD, DM, obesity, thyroid nodules, pulmonary nodules, chronic back pain, HTN, COPD, hx depression    Limitations pain and swelling in R axilla and arm, chronic back pain, muscle weakness, impaired balance, decreased RUE AROM, abnormal gait, difficulty walking, impaired functional mobility and transfers,    Repetition Increases Symptoms    Special Tests slight positive Stemmer at R MPs     Patient Stated Goals relduce swelling and pain in my R arm and hand and keep it from getting worse so I can do what I need to do    Pain Onset More than a month ago                        OT Treatments/Exercises (OP) - 04/29/20 0001      ADLs   ADL Education Given Yes      Manual Therapy   Manual Therapy Edema management;Manual Lymphatic Drainage (MLD)    Manual Lymphatic Drainage (MLD) MLD to bilateral mediatinal LN , clavicular LN and intercostal LM in supine and side lying.     Compression Bandaging Pt independentlly dons existing compression sleeve and glove at end of session                  OT Education - 04/29/20 1635    Education Details Pt edu re thoracic and mediastinal lymph node anatomy    Person(s) Educated Patient    Methods Demonstration;Explanation    Comprehension Verbalized understanding;Returned demonstration               OT Long Term Goals - 04/14/20 1000      OT LONG TERM GOAL #1   Title Pt modified independent using printed reference  with lymphedema precautions and prevention strategies to limit LE progression.    Baseline  min A    Time 12    Period Weeks    Status On-going    Target Date 07/13/20      OT LONG TERM GOAL #2   Title Pt will be modified independent with all lymphedema self-care home program acomponents, including self-MLD ( with Flexitouch device 1 x q day), daily custom compression glove and sleeve, daily skin care and lymphatic pumping ther ex using assistive devices and with extra time in keeping with energy conservation and work simplification principals.      Baseline Mod A    Time 12    Period Weeks    Status New    Target Date 07/13/20      OT LONG TERM GOAL #3   Title Pt wil report 0/10 pain with RUE functional use ( reaching, lifting, carrying, dressing, dressing, etc) to achieve independent , pain free ADLs performance.    Baseline min A    Time 12    Period Weeks    Status New    Target Date  07/13/20      OT LONG TERM GOAL #4   Title Pt able to don and doff all custom compression garments and devices usiing assistive devices and/or extra time PRN to ensure optimal LE symptom control and to limit progression    Baseline supervision    Time 12    Period Weeks    Status On-going    Target Date 07/13/20      OT LONG TERM GOAL #6   Title Pt will retain all clinical gains achieved during Intensive Phase CDT to limit lLE progression and further functional decline as evidenced by no more than 3% upward volume fluctuation in RUE/RUQ  over time.    Baseline Mod A    Time 12    Period Weeks    Status New    Target Date 07/13/20                 Plan - 04/29/20 1637    Clinical Impression Statement Ms. Tortorelli tolerated MLD to terminus at lateral neck, clavicular LN, mediastinal LN, and parasternal and intercostal LN in supine and side lying without increased pain or SOB. Pt reported analgesic effect after manual therapy. Cont as per POC. RUE swelling is well managed and well controlled.    OT Occupational Profile and History Comprehensive Assessment- Review of records and extensive additional review of physical, cognitive, psychosocial history related to current functional performance    Occupational performance deficits (Please refer to evaluation for details): ADL's;IADL's;Social Participation;Leisure;Rest and Sleep;Work    Marketing executive / Function / Physical Skills ADL;Decreased knowledge of precautions;ROM;UE functional use;Decreased knowledge of use of DME;Edema;Skin integrity;Pain;IADL;Sensation;Mobility;Strength    Rehab Potential Good    Clinical Decision Making Several treatment options, min-mod task modification necessary    Comorbidities Affecting Occupational Performance: Presence of comorbidities impacting occupational performance    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 Modify schedule in keeping with burden of cancer care   OT Treatment/Interventions Self-care/ADL training;Therapeutic exercise;Manual lymph drainage;Patient/family education;Therapeutic activities;DME and/or AE instruction;Manual Therapy;Other (comment);Energy conservation;Coping strategies training   replace custom compression garments PRN. Replace with alternative garments PRN, retrain use of Flexitouch PRN,   Plan lymphatic pumping ther ex; diaphragmatic  breathing/ pursed lip breathing, relaxation techniques    Consulted and Agree with Plan of Care Patient           Patient will benefit from skilled therapeutic intervention in order to improve the following deficits and impairments:   Body Structure / Function / Physical Skills: ADL, Decreased knowledge of precautions, ROM, UE functional use, Decreased knowledge of use of DME, Edema, Skin integrity, Pain, IADL, Sensation, Mobility, Strength       Visit Diagnosis: Postmastectomy lymphedema syndrome    Problem List Patient Active Problem List   Diagnosis Date Noted  . Otalgia of left ear 08/11/2019  . Edema 04/16/2019  . Postoperative wound infection 03/13/2019  . Seroma of breast 03/11/2019  . Age-related osteoporosis without current pathological fracture 02/27/2019  . Malignant neoplasm of central portion of right breast in female, estrogen receptor positive ([place]) 02/12/2019  . Genetic testing 01/31/2019  . Malignant neoplasm of upper-inner quadrant of right breast in female, estrogen receptor positive ([place]) 01/15/2019  . Long term current use of opiate analgesic 12/02/2018  . Tympanic membrane central perforation, left 11/04/2018  . DDD (degenerative disc disease), lumbosacral 10/10/2018  . Abnormal MRI, lumbar spine (08/29/2018) 10/10/2018  . 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 Lumbar nerve root impingement (Bilateral: L4 & L5) (L>R: L5) 10/10/2018  . Lumbar Spinal instability of L4-5 10/10/2018  . Lumbar facet hypertrophy 10/10/2018  . COPD (chronic obstructive pulmonary disease) with emphysema ([place]) 08/28/2018  . Elevated rheumatoid factor 08/13/2018  . Elevated C-reactive protein (CRP) 08/12/2018  . Elevated sed rate 08/12/2018  . Lumbar facet syndrome (Bilateral) 08/12/2018  . Back pain with left-sided radiculopathy 08/12/2018  . DDD (degenerative disc disease), lumbar 08/12/2018  . Chronic lower extremity pain (Primary Area of Pain) (Bilateral) (L>R) 07/23/2018  . Chronic low back pain (Secondary Area of Pain) (Bilateral) (L>R) w/ sciatica (Bilateral) 07/23/2018  . Chronic pain syndrome 07/23/2018  . Pharmacologic therapy 07/23/2018  . Disorder of skeletal system 07/23/2018  . Problems influencing health status 07/23/2018  . Chronic sacroiliac joint pain [facility] Area of Pain) (Bilateral) (L>R) 07/23/2018  . Status post lumboperitoneal shunt placement 02/26/2018  . 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 Tympanic membrane perforation 05/30/2016  . PMB (postmenopausal bleeding) 04/26/2016  . Rash 12/27/2015  . 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 Plantar fasciitis of foot (Left) 05/26/2015  . Spondylosis of lumbar region without myelopathy or radiculopathy 05/26/2015  . Lumbar foraminal stenosis (L3-4, L4-5, L5-S1) (L>R) 05/26/2015  . Spinal stenosis of lumbar region with neurogenic claudication 05/26/2015  . Encounter for screening mammogram for malignant neoplasm of breast 12/02/2014  . Goiter 12/02/2014  . Chronic idiopathic gout of foot (Right) 07/06/2014  . Biceps tendonitis (Right) 05/19/2014  . Shoulder pain (Right) 05/19/2014  . Facial weakness 05/05/2014  . Post herpetic neuralgia 05/05/2014  . Chronic  dysfunction of left eustachian tube 03/24/2014  . Osteopenia 03/13/2014  . Lumbar Grade 1 Anterolisthesis of L3/4 (40mm) & L4/5 (4-31mm)(w/ Dynamic instability) 03/13/2014  . Family history of colon cancer 12/02/2013  . Dyshidrotic eczema 11/17/2013  . Diastasis recti 04/08/2013  . Facial nerve paresis 07/31/2012  . Blepharospasm 05/08/2012  . 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 Obesity 03/28/2012  . Hemoptysis 12/22/2011  . Neuropathic postherpetic trigeminal neuralgia 12/12/2011  . Allergic rhinitis, mild 10/12/2011  . Disequilibrium syndrome 09/27/2011  .  Chronic bronchitis ([place]) 06/05/2011  . Essential hypertension 05/24/2011  . Type 2 diabetes mellitus without complications ([place]) 23/00/9794  . Pulmonary nodules 05/05/2011   Andrey Spearman, MS, OTR/L, [facility] 04/29/20 4:41 PM   [place] MAIN [facility] SERVICES [address] [place], [place], [address] Phone: [phone]   Fax:  [phone]  Name: Susan Callahan MRN: [id number] Date of Birth: 1950/12/29

## 2020-04-29 NOTE — Progress Notes (Signed)
Daily Session Note  Patient Details  Name: Susan Callahan MRN: [id number] Date of Birth: [birth year] Referring Provider:     Pulmonary Rehab from 03/18/2020 in [facility]  Referring Provider [name] MD      Encounter Date: 04/29/2020  Check In:  Session Check In - 04/29/20 1022      Check-In   Supervising physician immediately available to respond to emergencies See telemetry face sheet for immediately available ER MD    Location [facility]-Cardiac & Pulmonary Rehab    Staff Present Heath Lark, RN, BSN, CCRP;Melissa Powell RDN, Rowe Pavy, BA, ACSM CEP, Exercise Physiologist    Virtual Visit No    Medication changes reported     No    Fall or balance concerns reported    No    Warm-up and Cool-down Performed on first and last piece of equipment    Resistance Training Performed Yes    VAD Patient? No    PAD/SET Patient? No      Pain Assessment   Currently in Pain? No/denies              Social History   Tobacco Use  Smoking Status Former Smoker  . Packs/day: 3.00  . Years: 27.00  . Pack years: 81.00  . Types: Cigarettes  . Quit date: 03/15/1991  . Years since quitting: 29.1  Smokeless Tobacco Never Used    Goals Met:  Proper associated with RPD/PD & O2 Sat Independence with exercise equipment Exercise tolerated well No report of cardiac concerns or symptoms  Goals Unmet:  Not Applicable  Comments: Pt able to follow exercise prescription today without complaint.  Will continue to monitor for progression.    Dr. Emily Filbert is Medical Director for [place] and [facility].

## 2020-05-06 ENCOUNTER — Ambulatory Visit: Payer: Medicare Other | Admitting: Occupational Therapy

## 2020-05-11 ENCOUNTER — Other Ambulatory Visit: Payer: Self-pay

## 2020-05-11 ENCOUNTER — Encounter: Payer: Medicare Other | Admitting: *Deleted

## 2020-05-11 DIAGNOSIS — R0602 Shortness of breath: Secondary | ICD-10-CM

## 2020-05-11 NOTE — Progress Notes (Signed)
Daily Session Note  Patient Details  Name: Susan Callahan MRN: [id number] Date of Birth: 12-23-1950 Referring Provider:     Pulmonary Rehab from 03/18/2020 in [facility]  Referring Provider [name] MD      Encounter Date: 05/11/2020  Check In:  Session Check In - 05/11/20 1014      Check-In   Supervising physician immediately available to respond to emergencies See telemetry face sheet for immediately available ER MD    Location [facility]    Staff Present Heath Lark, RN, BSN, Jacklynn Bue, MS Exercise Physiologist;Amanda Oletta Darter, [place], ACSM CEP, Exercise Physiologist    Virtual Visit No    Medication changes reported     No    Fall or balance concerns reported    No    Warm-up and Cool-down Performed on first and last piece of equipment    Resistance Training Performed Yes    VAD Patient? No    PAD/SET Patient? No      Pain Assessment   Currently in Pain? No/denies              Social History   Tobacco Use  Smoking Status Former Smoker  . Packs/day: 3.00  . Years: 27.00  . Pack years: 81.00  . Types: Cigarettes  . Quit date: 03/15/1991  . Years since quitting: 29.1  Smokeless Tobacco Never Used    Goals Met:  Proper associated with RPD/PD & O2 Sat Independence with exercise equipment Exercise tolerated well No report of cardiac concerns or symptoms  Goals Unmet:  Not Applicable  Comments: Pt able to follow exercise prescription today without complaint.  Will continue to monitor for progression.    Dr. Emily Filbert is Medical Director for [facility].

## 2020-05-19 ENCOUNTER — Encounter: Payer: Self-pay | Admitting: *Deleted

## 2020-05-19 DIAGNOSIS — R0602 Shortness of breath: Secondary | ICD-10-CM

## 2020-05-19 NOTE — Progress Notes (Signed)
Pulmonary Individual Treatment Plan  Patient Details  Name: Susan Callahan MRN: [id number] Date of Birth: Mar 26, 1951 Referring Provider:     Pulmonary Rehab from 03/18/2020 in [facility] Cardiac and Pulmonary Rehab  Referring Provider [name] MD      Initial Encounter Date:    Pulmonary Rehab from 03/18/2020 in [facility] Cardiac and Pulmonary Rehab  Date 03/18/20      Visit Diagnosis: Shortness of breath  Patient's Home Medications on Admission:  Current Outpatient Medications:  .  acetaZOLAMIDE (DIAMOX) 250 MG tablet, Take 250 mg by mouth daily. , Disp: , Rfl:  .  allopurinol (ZYLOPRIM) 300 MG tablet, Take 300 mg by mouth daily. , Disp: , Rfl:  .  amitriptyline (ELAVIL) 10 MG tablet, Take 10 mg by mouth at bedtime. , Disp: , Rfl:  .  atorvastatin (LIPITOR) 10 MG tablet, Take 10 mg by mouth daily., Disp: , Rfl:  .  b complex vitamins capsule, Take 1 capsule by mouth daily. , Disp: , Rfl:  .  Calcium Carbonate-Vitamin D 600-400 MG-UNIT tablet, Take 1 tablet by mouth daily. , Disp: , Rfl:  .  Cholecalciferol (VITAMIN D) 125 MCG (5000 UT) CAPS, Take 5,000 Units by mouth daily., Disp: , Rfl:  .  EPINEPHrine 0.3 mg/0.3 mL IJ SOAJ injection, Inject 0.3 mLs into the muscle as needed., Disp: , Rfl:  .  Exemestane (AROMASIN PO), Take 25 mg by mouth daily., Disp: , Rfl:  .  furosemide (LASIX) 40 MG tablet, Take 40 mg by mouth 2 (two) times daily.  (Patient not taking: Reported on 03/11/2020), Disp: , Rfl:  .  gabapentin (NEURONTIN) 600 MG tablet, Take 600 mg by mouth 2 (two) times daily. , Disp: , Rfl:  .  magnesium oxide (MAG-OX) 400 MG tablet, Take by mouth., Disp: , Rfl:  .  metFORMIN (GLUCOPHAGE-XR) 500 MG 24 hr tablet, Take 500 mg by mouth 2 (two) times daily. , Disp: , Rfl:  .  Multiple Vitamin (MULTIVITAMIN) capsule, Take 1 capsule by mouth daily. , Disp: , Rfl:  .  mupirocin ointment (BACTROBAN) 2 %, Apply two times a day for 7 days., Disp: 22 g, Rfl: 0 .  traMADol (ULTRAM) 50 MG tablet,  Take 1 tablet (50 mg total) by mouth every 6 (six) hours as needed for severe pain., Disp: 120 tablet, Rfl: 2 .  verapamil (CALAN-SR) 240 MG CR tablet, Take 240 mg by mouth daily. , Disp: , Rfl:   Past Medical History: Past Medical History:  Diagnosis Date  . COPD (chronic obstructive pulmonary disease) ([place])   . Depression 1974  . Diabetes mellitus without complication ([place])   . Hypertension   . Migraine   . 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 Packs/day: 3.00  . Years: 27.00  . Pack years: 81.00  . Types: Cigarettes  . Quit date: 03/15/1991  . Years since quitting: 29.2  Smokeless Tobacco Never Used    Labs: Recent Review Flowsheet Data   There is no flowsheet data to display.      Pulmonary Assessment Scores:  Pulmonary Assessment Scores    Row Name 03/18/20 1108         ADL UCSD   ADL Phase Entry     SOB Score total 64     Rest 1     Walk 2     Stairs 5     Bath 0  Dress 2     Shop 4       CAT Score   CAT Score 24       mMRC Score   mMRC Score 2            UCSD: Self-administered rating of dyspnea associated with activities of daily living (ADLs) 6-point scale (0 = "not at all" to 5 = "maximal or unable to do because of breathlessness")  Scoring Scores range from 0 to 120.  Minimally important difference is 5 units  CAT: CAT can identify the health impairment of COPD patients and is better correlated with disease progression.  CAT has a scoring range of zero to 40. The CAT score is classified into four groups of low (less than 10), medium (10 - 20), high (21-30) and very high (31-40) based on the impact level of disease on health status. A CAT score over 10 suggests significant symptoms.  A worsening CAT score could be explained by an exacerbation, poor medication adherence, poor inhaler technique, or progression of COPD or comorbid conditions.  CAT MCID is 2  points  mMRC: mMRC (Modified Medical Research Council) Dyspnea Scale is used to assess the degree of baseline functional disability in patients of respiratory disease due to dyspnea. No minimal important difference is established. A decrease in score of 1 point or greater is considered a positive change.   Pulmonary Function Assessment:   Exercise Target Goals: Exercise Program Goal: Individual exercise prescription set using results from initial 6 min walk test and THRR while considering  patient's activity barriers and safety.   Exercise Prescription Goal: Initial exercise prescription builds to 30-45 minutes a day of aerobic activity, 2-3 days per week.  Home exercise guidelines will be given to patient during program as part of exercise prescription that the participant will acknowledge.  Education: Aerobic Exercise & Resistance Training: - Gives group verbal and written instruction on the various components of exercise. Focuses on aerobic and resistive training programs and the benefits of this training and how to safely progress through these programs..   Pulmonary Rehab from 04/29/2020 in [facility] Cardiac and Pulmonary Rehab  Date 04/29/20  Educator [facility]  Instruction Review Code 1- Verbalizes Understanding      Education: Exercise & Equipment Safety: - Individual verbal instruction and demonstration of equipment use and safety with use of the equipment.   Pulmonary Rehab from 04/29/2020 in [facility] Cardiac and Pulmonary Rehab  Date 03/11/20  Educator [facility]  Instruction Review Code 1- Verbalizes Understanding      Education: Exercise Physiology & General Exercise Guidelines: - Group verbal and written instruction with models to review the exercise physiology of the cardiovascular system and associated critical values. Provides general exercise guidelines with specific guidelines to those with heart or lung disease.    Pulmonary Rehab from 07/19/2018 in [facility] Cardiac and Pulmonary Rehab   Date 06/05/18  Educator [facility]  Instruction Review Code 1- Verbalizes Understanding      Education: Flexibility, Balance, Mind/Body Relaxation: Provides group verbal/written instruction on the benefits of flexibility and balance training, including mind/body exercise modes such as yoga, pilates and tai chi.  Demonstration and skill practice provided.   Pulmonary Rehab from 07/19/2018 in [facility] Cardiac and Pulmonary Rehab  Date 07/03/18  Educator [facility]  Instruction Review Code 1- Verbalizes Understanding      Activity Barriers & Risk Stratification:  Activity Barriers & Cardiac Risk Stratification - 03/18/20 1036      Activity Barriers & Cardiac Risk Stratification  Activity Barriers Balance Concerns;Muscular Weakness;Deconditioning;Shortness of Breath;Back Problems           6 Minute Walk:  6 Minute Walk    Row Name 03/18/20 1026         6 Minute Walk   Phase Initial     Distance 700 feet     Walk Time 5.05 minutes     # of Rest Breaks 0     MPH 1.57     METS 1.64     RPE 15     Perceived Dyspnea  4     VO2 Peak 5.75     Symptoms Yes (comment)     Comments SOB     Resting HR 66 bpm     Resting BP 124/64     Resting Oxygen Saturation  94 %     Exercise Oxygen Saturation  during 6 min walk 83 %     Max Ex. HR 96 bpm     Max Ex. BP 154/70     2 Minute Post BP 146/74       Interval HR   1 Minute HR 81     2 Minute HR 88     3 Minute HR 94     4 Minute HR 88     5 Minute HR 88     6 Minute HR 96     2 Minute Post HR 73     Interval Heart Rate? Yes       Interval Oxygen   Interval Oxygen? Yes     Baseline Oxygen Saturation % 94 %     1 Minute Oxygen Saturation % 91 %     1 Minute Liters of Oxygen 3 L     2 Minute Oxygen Saturation % 85 %     2 Minute Liters of Oxygen 3 L     3 Minute Oxygen Saturation % 84 %  at 3:27 83%     3 Minute Liters of Oxygen 3 L     4 Minute Oxygen Saturation % 86 %     4 Minute Liters of Oxygen 3 L     5 Minute Oxygen Saturation %  89 %     5 Minute Liters of Oxygen 3 L     6 Minute Oxygen Saturation % 87 %     6 Minute Liters of Oxygen 3 L     2 Minute Post Oxygen Saturation % 95 %     2 Minute Post Liters of Oxygen 3 L           Oxygen Initial Assessment:  Oxygen Initial Assessment - 03/11/20 1037      Home Oxygen   Home Oxygen Device Home Concentrator;E-Tanks    Sleep Oxygen Prescription Continuous    Liters per minute 1    Home Exercise Oxygen Prescription Continuous    Liters per minute 2    Home at Rest Exercise Oxygen Prescription Continuous    Liters per minute 1    Compliance with Home Oxygen Use Yes      Initial 6 min Walk   Oxygen Used Continuous    Liters per minute 2      Program Oxygen Prescription   Program Oxygen Prescription Continuous    Liters per minute 2      Intervention   Short Term Goals To learn and exhibit compliance with exercise, home and travel O2 prescription;To learn and understand importance of monitoring SPO2 with pulse  oximeter and demonstrate accurate use of the pulse oximeter.;To learn and understand importance of maintaining oxygen saturations>88%;To learn and demonstrate proper pursed lip breathing techniques or other breathing techniques.;To learn and demonstrate proper use of respiratory medications    Long  Term Goals Exhibits compliance with exercise, home and travel O2 prescription;Maintenance of O2 saturations>88%;Verbalizes importance of monitoring SPO2 with pulse oximeter and return demonstration;Exhibits proper breathing techniques, such as pursed lip breathing or other method taught during program session;Compliance with respiratory medication;Demonstrates proper use of MDI's           Oxygen Re-Evaluation:  Oxygen Re-Evaluation    Row Name 04/06/20 0935 05/11/20 0934           Program Oxygen Prescription   Program Oxygen Prescription Continuous Continuous      Liters per minute 2 2        Home Oxygen   Home Oxygen Device Home  Concentrator;E-Tanks Home Concentrator;E-Tanks      Sleep Oxygen Prescription Continuous Continuous      Liters per minute 2 2      Home Exercise Oxygen Prescription Continuous Continuous      Liters per minute 2 2      Home at Rest Exercise Oxygen Prescription Continuous Continuous      Liters per minute 1 2      Compliance with Home Oxygen Use Yes Yes        Goals/Expected Outcomes   Short Term Goals To learn and exhibit compliance with exercise, home and travel O2 prescription;To learn and understand importance of monitoring SPO2 with pulse oximeter and demonstrate accurate use of the pulse oximeter.;To learn and understand importance of maintaining oxygen saturations>88%;To learn and demonstrate proper pursed lip breathing techniques or other breathing techniques.;To learn and demonstrate proper use of respiratory medications To learn and exhibit compliance with exercise, home and travel O2 prescription;To learn and understand importance of monitoring SPO2 with pulse oximeter and demonstrate accurate use of the pulse oximeter.;To learn and understand importance of maintaining oxygen saturations>88%;To learn and demonstrate proper pursed lip breathing techniques or other breathing techniques.;To learn and demonstrate proper use of respiratory medications      Long  Term Goals Exhibits compliance with exercise, home and travel O2 prescription;Maintenance of O2 saturations>88%;Verbalizes importance of monitoring SPO2 with pulse oximeter and return demonstration;Exhibits proper breathing techniques, such as pursed lip breathing or other method taught during program session;Compliance with respiratory medication;Demonstrates proper use of MDI's Exhibits compliance with exercise, home and travel O2 prescription;Maintenance of O2 saturations>88%;Verbalizes importance of monitoring SPO2 with pulse oximeter and return demonstration;Exhibits proper breathing techniques, such as pursed lip breathing or other  method taught during program session;Compliance with respiratory medication;Demonstrates proper use of MDI's      Comments Susan Callahan is really struggling with her breathing and desaturating when she walks.  We talked about PLB and talking to the doctor to increase her liter flow. Susan Callahan uses 2L at home most of the time as she is fairly active and doesnt sit for too long.  She uses PLB when needed.  She goes up to 3L when walking if needed as recommended by Dr.      Bertram Millard Outcomes Short: Talk to doctor to see if she can increase her liter flow.  Long; Continue to work on PLB. Short: continue to monitor oxygen when at home Long: continue to work on PLB             Oxygen Discharge (Final Oxygen Re-Evaluation):  Oxygen Re-Evaluation -  05/11/20 0934      Program Oxygen Prescription   Program Oxygen Prescription Continuous    Liters per minute 2      Home Oxygen   Home Oxygen Device Home Concentrator;E-Tanks    Sleep Oxygen Prescription Continuous    Liters per minute 2    Home Exercise Oxygen Prescription Continuous    Liters per minute 2    Home at Rest Exercise Oxygen Prescription Continuous    Liters per minute 2    Compliance with Home Oxygen Use Yes      Goals/Expected Outcomes   Short Term Goals To learn and exhibit compliance with exercise, home and travel O2 prescription;To learn and understand importance of monitoring SPO2 with pulse oximeter and demonstrate accurate use of the pulse oximeter.;To learn and understand importance of maintaining oxygen saturations>88%;To learn and demonstrate proper pursed lip breathing techniques or other breathing techniques.;To learn and demonstrate proper use of respiratory medications    Long  Term Goals Exhibits compliance with exercise, home and travel O2 prescription;Maintenance of O2 saturations>88%;Verbalizes importance of monitoring SPO2 with pulse oximeter and return demonstration;Exhibits proper breathing techniques, such as pursed lip  breathing or other method taught during program session;Compliance with respiratory medication;Demonstrates proper use of MDI's    Comments Susan Callahan uses 2L at home most of the time as she is fairly active and doesnt sit for too long.  She uses PLB when needed.  She goes up to 3L when walking if needed as recommended by Dr.    Bertram Millard Outcomes Short: continue to monitor oxygen when at home Long: continue to work on PLB           Initial Exercise Prescription:  Initial Exercise Prescription - 03/18/20 1000      Date of Initial Exercise RX and Referring Provider   Date 03/18/20    Referring Provider [name] MD      Oxygen   Oxygen Continuous    Liters 3-4      Treadmill   MPH 1.5    Grade 0    Minutes 15    METs 2.15      NuStep   Level 1    SPM 80    Minutes 15    METs 1.6      REL-XR   Level 1    Speed 50    Minutes 15    METs 1.6      Prescription Details   Frequency (times per week) 2    Duration Progress to 30 minutes of continuous aerobic without signs/symptoms of physical distress      Intensity   THRR 40-80% of Max Heartrate 100-134    Ratings of Perceived Exertion 11-13    Perceived Dyspnea 0-4      Progression   Progression Continue to progress workloads to maintain intensity without signs/symptoms of physical distress.      Resistance Training   Training Prescription Yes    Weight 3 lb    Reps 10-15           Perform Capillary Blood Glucose checks as needed.  Exercise Prescription Changes:  Exercise Prescription Changes    Row Name 03/18/20 1000 04/06/20 1300 04/20/20 1500 05/04/20 1500 05/18/20 1500     Response to Exercise   Blood Pressure (Admit) 124/64 132/62 124/60 134/70 134/72   Blood Pressure (Exercise) 154/70 138/62 138/64 130/70 130/70   Blood Pressure (Exit) 136/70 128/62 150/90 128/64 122/62   Heart Rate (Admit) 66 bpm 72 bpm  82 bpm 80 bpm 84 bpm   Heart Rate (Exercise) 96 bpm 87 bpm 92 bpm 98 bpm 94 bpm   Heart  Rate (Exit) 67 bpm 74 bpm 79 bpm 94 bpm 82 bpm   Oxygen Saturation (Admit) 94 % 92 % 94 % 94 % 89 %   Oxygen Saturation (Exercise) 83 % 86 % 90 % 90 % 88 %   Oxygen Saturation (Exit) 94 % 92 % 94 % 98 % 91 %   Rating of Perceived Exertion (Exercise) _0 Perceived Dyspnea (Exercise) _1 Symptoms SOB none none SOB SOB, fatigue   Comments walk test results -- -- -- --   Duration -- Progress to 30 minutes of  aerobic without signs/symptoms of physical distress Continue with 30 min of aerobic exercise without signs/symptoms of physical distress. Continue with 30 min of aerobic exercise without signs/symptoms of physical distress. Continue with 30 min of aerobic exercise without signs/symptoms of physical distress.   Intensity -- THRR unchanged THRR unchanged THRR unchanged THRR unchanged     Progression   Progression -- -- Continue to progress workloads to maintain intensity without signs/symptoms of physical distress. Continue to progress workloads to maintain intensity without signs/symptoms of physical distress. Continue to progress workloads to maintain intensity without signs/symptoms of physical distress.   Average METs -- -- 2.18 2 2.6     Resistance Training   Training Prescription -- Yes Yes Yes Yes   Weight -- 3 lb 3 lb 3 lb 3 lb   Reps -- 10-15 10-15 10-15 10-15     Interval Training   Interval Training -- -- No No No     Oxygen   Oxygen -- Continuous Continuous Continuous Continuous   Liters -- 3-4 3-4 3-4 2-3     Treadmill   MPH -- -- 1.5 1.5 --   Grade -- -- 0 0 --   Minutes -- -- 15 15 --   METs -- -- 2.15 2.15 --     Recumbant Bike   Level -- 1 1 -- 1   RPM -- 60 -- -- --   Watts -- -- -- -- 18   Minutes -- 15 15 -- 15   METs -- 2.85 -- -- 2.6     NuStep   Level -- 2 4 -- 4   SPM -- 80 -- -- --   Minutes -- 15 15 -- 15   METs -- 2.3 2.2 -- 2.5     T5 Nustep   Level -- -- 1 1 --   SPM -- -- -- 80 --   Minutes -- -- 15 15 --   METs -- --  -- 1.9 --          Exercise Comments:   Exercise Goals and Review:  Exercise Goals    Row Name 03/18/20 1053             Exercise Goals   Increase Physical Activity Yes       Intervention Provide advice, education, support and counseling about physical activity/exercise needs.;Develop an individualized exercise prescription for aerobic and resistive training based on initial evaluation findings, risk stratification, comorbidities and participant's personal goals.       Expected Outcomes Short Term: Attend rehab on a regular basis to increase amount of physical activity.;Long Term: Add in home exercise to make exercise part of routine and to increase amount of physical activity.;Long Term: Exercising regularly  at least 3-5 days a week.       Increase Strength and Stamina Yes       Intervention Provide advice, education, support and counseling about physical activity/exercise needs.;Develop an individualized exercise prescription for aerobic and resistive training based on initial evaluation findings, risk stratification, comorbidities and participant's personal goals.       Expected Outcomes Short Term: Increase workloads from initial exercise prescription for resistance, speed, and METs.;Short Term: Perform resistance training exercises routinely during rehab and add in resistance training at home;Long Term: Improve cardiorespiratory fitness, muscular endurance and strength as measured by increased METs and functional capacity (6MWT)       Able to understand and use rate of perceived exertion (RPE) scale Yes       Intervention Provide education and explanation on how to use RPE scale       Expected Outcomes Short Term: Able to use RPE daily in rehab to express subjective intensity level;Long Term:  Able to use RPE to guide intensity level when exercising independently       Able to understand and use Dyspnea scale Yes       Intervention Provide education and explanation on how to use  Dyspnea scale       Expected Outcomes Short Term: Able to use Dyspnea scale daily in rehab to express subjective sense of shortness of breath during exertion;Long Term: Able to use Dyspnea scale to guide intensity level when exercising independently       Knowledge and understanding of Target Heart Rate Range (THRR) Yes       Intervention Provide education and explanation of THRR including how the numbers were predicted and where they are located for reference       Expected Outcomes Short Term: Able to state/look up THRR;Short Term: Able to use daily as guideline for intensity in rehab;Long Term: Able to use THRR to govern intensity when exercising independently       Able to check pulse independently Yes       Intervention Provide education and demonstration on how to check pulse in carotid and radial arteries.;Review the importance of being able to check your own pulse for safety during independent exercise       Expected Outcomes Short Term: Able to explain why pulse checking is important during independent exercise;Long Term: Able to check pulse independently and accurately       Understanding of Exercise Prescription Yes       Intervention Provide education, explanation, and written materials on patient's individual exercise prescription       Expected Outcomes Short Term: Able to explain program exercise prescription;Long Term: Able to explain home exercise prescription to exercise independently              Exercise Goals Re-Evaluation :  Exercise Goals Re-Evaluation    Row Name 04/06/20 1339 04/20/20 1521 05/04/20 1546 05/11/20 0937 05/18/20 1544     Exercise Goal Re-Evaluation   Exercise Goals Review Increase Physical Activity;Increase Strength and Stamina Increase Physical Activity;Increase Strength and Stamina;Understanding of Exercise Prescription Increase Physical Activity;Increase Strength and Stamina;Understanding of Exercise Prescription Increase Physical Activity;Increase  Strength and Stamina;Understanding of Exercise Prescription Increase Physical Activity;Increase Strength and Stamina;Understanding of Exercise Prescription   Comments Reviewed RPE and dyspnea scales, THR and program prescription with pt today.  Pt voiced understanding and was given a copy of goals to take home. Susan Callahan has been doing well in rehab.  She has now started her cancer treatments and infusions, first  was yesterday.  She called out today, but we will take each day as she comes and work on maintaining progress. Susan Callahan does as much as she feels she can when she attends.  Staff will monitor progress. Susan Callahan has done some chair exercise with her cousin outside program sessions.  Class is on Zoom. Susan Callahan is doing well.  She has started her chemo infusions which will keep her out of class for a few days and then she returns.  She is up to level 4 on the NuStep.  We will continue to encourage good attendance and monitor her progress.   Expected Outcomes Short: Use RPE daily to regulate intensity. Long: Follow program prescription in THR. Short: Attend rehab regularly Long; Continue to maintain stamina. Short:  exercise consistently Long:  maintain stamina -- Short: continue to exercise even on days not in class Long: Continue to maintain and/or improve stamina          Discharge Exercise Prescription (Final Exercise Prescription Changes):  Exercise Prescription Changes - 05/18/20 1500      Response to Exercise   Blood Pressure (Admit) 134/72    Blood Pressure (Exercise) 130/70    Blood Pressure (Exit) 122/62    Heart Rate (Admit) 84 bpm    Heart Rate (Exercise) 94 bpm    Heart Rate (Exit) 82 bpm    Oxygen Saturation (Admit) 89 %    Oxygen Saturation (Exercise) 88 %    Oxygen Saturation (Exit) 91 %    Rating of Perceived Exertion (Exercise) 13    Perceived Dyspnea (Exercise) 1    Symptoms SOB, fatigue    Duration Continue with 30 min of aerobic exercise without signs/symptoms of physical  distress.    Intensity THRR unchanged      Progression   Progression Continue to progress workloads to maintain intensity without signs/symptoms of physical distress.    Average METs 2.6      Resistance Training   Training Prescription Yes    Weight 3 lb    Reps 10-15      Interval Training   Interval Training No      Oxygen   Oxygen Continuous    Liters 2-3      Recumbant Bike   Level 1    Watts 18    Minutes 15    METs 2.6      NuStep   Level 4    Minutes 15    METs 2.5           Nutrition:  Target Goals: Understanding of nutrition guidelines, daily intake of sodium <158m, cholesterol <2044m calories 30% from fat and 7% or less from saturated fats, daily to have 5 or more servings of fruits and vegetables.  Education: Controlling Sodium/Reading Food Labels -Group verbal and written material supporting the discussion of sodium use in heart healthy nutrition. Review and explanation with models, verbal and written materials for utilization of the food label.   [facility]  Date 05/29/18  Educator LB  Instruction Review Code 1- Verbalizes Understanding      Education: General Nutrition Guidelines/Fats and Fiber: -Group instruction provided by verbal, written material, models and posters to present the general guidelines for heart healthy nutrition. Gives an explanation and review of dietary fats and fiber.   [facility]  Date 05/22/18  Educator LB  Instruction Review Code 1- Verbalizes Understanding  Biometrics:  Pre Biometrics - 03/18/20 1054      Pre Biometrics   Height _0  (1.651 m)    Weight 203 lb 14.4 oz (92.5 kg)    BMI (Calculated) 33.93    Single Leg Stand 5.88 seconds            Nutrition Therapy Plan and Nutrition Goals:  Nutrition Therapy & Goals - 04/13/20 0926      Nutrition Therapy   Diet High kcal, High Protein     Protein (specify units) 110-115g (COPD and current cancer treatment with recent 20 lb unintentional weight loss)    Fiber 25 grams    Whole Grain Foods 2 servings    Saturated Fats 12 max. grams    Fruits and Vegetables 5 servings/day    Sodium 1.5 grams      Personal Nutrition Goals   Nutrition Goal ST: eat small frequent meals, add fat to meals she is already eating, eat high quality protein at each meal LT: Maintain health and weight going through chemo    Comments Lung Cancer starting chemo 8/9 and immunotherapy. Skips breakfast - not hungry and nothing is appealing- may have some cereal or some fruit and cool whip. Starts feeling like eaitng around 11am. Discussed high calorie and high protein nutrition therapy and discussed expectations for cancer treatment. Discussed T2DM nutrition therapy. Pt does not have an RD following her for cancer treatment - encouraged her to have an Rd to follow her - she is being treated with [facility] and [facility] - will contact [place] RD.      Intervention Plan   Intervention Prescribe, educate and counsel regarding individualized specific dietary modifications aiming towards targeted core components such as weight, hypertension, lipid management, diabetes, heart failure and other comorbidities.;Nutrition handout(s) given to patient.    Expected Outcomes Short Term Goal: Understand basic principles of dietary content, such as calories, fat, sodium, cholesterol and nutrients.;Short Term Goal: A plan has been developed with personal nutrition goals set during dietitian appointment.;Long Term Goal: Adherence to prescribed nutrition plan.           Nutrition Assessments:  Nutrition Assessments - 03/30/20 1111      MEDFICTS Scores   Pre Score 15           MEDIFICTS Score Key:          ?70 Need to make dietary changes          40-70 Heart Healthy Diet         ? 40 Therapeutic Level Cholesterol Diet  Nutrition Goals Re-Evaluation:  Nutrition Goals  Re-Evaluation    Susan Callahan Name 05/11/20 [phone]             Goals   Nutrition Goal ST: get in touch with oncology RDN, continue with current changes LT: maintain halth and weight through chemo       Comment Pt reports eating and getting enough protein, no loss of appetite or taste changes at this time. She has Callahan not talked to an oncology dietitian, encouraged her to keep trying.       Expected Outcome ST: get in touch with oncology RDN, continue with current changes LT: maintain halth and weight through chemo              Nutrition Goals Discharge (Final Nutrition Goals Re-Evaluation):  Nutrition Goals Re-Evaluation - 05/11/20 0045      Goals   Nutrition Goal ST: get in touch with oncology RDN, continue  with current changes LT: maintain halth and weight through chemo    Comment Pt reports eating and getting enough protein, no loss of appetite or taste changes at this time. She has Callahan not talked to an oncology dietitian, encouraged her to keep trying.    Expected Outcome ST: get in touch with oncology RDN, continue with current changes LT: maintain halth and weight through chemo           Psychosocial: Target Goals: Acknowledge presence or absence of significant depression and/or stress, maximize coping skills, provide positive support system. Participant is able to verbalize types and ability to use techniques and skills needed for reducing stress and depression.   Education: Depression - Provides group verbal and written instruction on the correlation between heart/lung disease and depressed mood, treatment options, and the stigmas associated with seeking treatment.   Pulmonary Rehab from 04/29/2020 in [facility] Cardiac and Pulmonary Rehab  Date 04/15/20  Educator SB  Instruction Review Code 1- [employer] Understanding      Education: Sleep Hygiene -Provides group verbal and written instruction about how sleep can affect your health.  Define sleep hygiene, discuss sleep cycles and  impact of sleep habits. Review good sleep hygiene tips.    Pulmonary Rehab from 07/19/2018 in [facility] Cardiac and Pulmonary Rehab  Date 05/15/18  Educator [facility]  Instruction Review Code 1- Verbalizes Understanding      Education: Stress and Anxiety: - Provides group verbal and written instruction about the health risks of elevated stress and causes of high stress.  Discuss the correlation between heart/lung disease and anxiety and treatment options. Review healthy ways to manage with stress and anxiety.   Pulmonary Rehab from 04/29/2020 in [facility] Cardiac and Pulmonary Rehab  Date 04/15/20  Educator SB  Instruction Review Code 1- Verbalizes Understanding      Initial Review & Psychosocial Screening:  Initial Psych Review & Screening - 03/11/20 1040      Initial Review   Current issues with Current Stress Concerns    Source of Stress Concerns Chronic Illness    Comments Susan Callahan has recently been diagnosed with Lung Cancer and is a big factor in her stress. She feels like she is handling her diagnosis well.      Family Dynamics   Good Support System? Yes    Comments Susan Callahan can talk to her sister in [place] for support.      Barriers   Psychosocial barriers to participate in program The patient should benefit from training in stress management and relaxation.      Screening Interventions   Interventions To provide support and resources with identified psychosocial needs;Encouraged to exercise;Provide feedback about the scores to participant    Expected Outcomes Short Term goal: Utilizing psychosocial counselor, staff and physician to assist with identification of specific Stressors or current issues interfering with healing process. Setting desired goal for each stressor or current issue identified.;Long Term Goal: Stressors or current issues are controlled or eliminated.;Short Term goal: Identification and review with participant of any Quality of Life or Depression concerns found by scoring the  questionnaire.;Long Term goal: The participant improves quality of Life and PHQ9 Scores as seen by post scores and/or verbalization of changes           Quality of Life Scores:  Scores of 19 and below usually indicate a poorer quality of life in these areas.  A difference of  2-3 points is a clinically meaningful difference.  A difference of 2-3 points in the  total score of the Quality of Life Index has been associated with significant improvement in overall quality of life, self-image, physical symptoms, and general health in studies assessing change in quality of life.  PHQ-9: Recent Review Flowsheet Data    Depression screen [facility] 2/9 04/06/2020 03/18/2020 12/02/2018 10/10/2018 08/12/2018   Decreased Interest 2 1 0 0 0   Down, Depressed, Hopeless 3 1 0 0 0   PHQ - 2 Score 5 2 0 0 0   Altered sleeping 3 0 - - -   Tired, decreased energy 3 2 - - -   Change in appetite 3 2 - - -   Feeling bad or failure about yourself  0 0 - - -   Trouble concentrating 1 0 - - -   Moving slowly or fidgety/restless 0 0 - - -   Suicidal thoughts 0 0 - - -   PHQ-9 Score 15 6 - - -   Difficult doing work/chores Somewhat difficult Somewhat difficult - - -     Interpretation of Total Score  Total Score Depression Severity:  1-4 = Minimal depression, 5-9 = Mild depression, 10-14 = Moderate depression, 15-19 = Moderately severe depression, 20-27 = Severe depression   Psychosocial Evaluation and Intervention:  Psychosocial Evaluation - 03/11/20 1043      Psychosocial Evaluation & Interventions   Interventions Encouraged to exercise with the program and follow exercise prescription    Comments Susan Callahan has recently been diagnosed with Lung Cancer and is a big factor in her stress. She feels like she is handling her diagnosis well.    Expected Outcomes Short: Exercise regularly to support mental health and notify staff of any changes. Long: Maintain mental health and well being through teaching of rehab or prescribed  medications independently.    Continue Psychosocial Services  Follow up required by staff           Psychosocial Re-Evaluation:  Psychosocial Re-Evaluation    [place] Name 04/06/20 0936 05/11/20 0932           Psychosocial Re-Evaluation   Current issues with Current Stress Concerns Current Stress Concerns      Comments Her brain CT scan was clear, but now awaiting on results for lungs; her appt is on Wednesday.  Her PHQ has gotten worse over the last month with her cancer and we talked about mentioning this to doctor to titrate meds.  She is sleeping okay, but her appeptitie is wanning as is her energy levels. Susan Callahan Callahan finds it hard to deal with everything.  Her Dr is supposed to hook her up with a Social worker.  She is Callahan sleeping ok.  She does feel exercise helps with her stamina and stress level.      Expected Outcomes Short: Get results and talk to doctor.  Long; Conitnue to focus on positive and exercise for mental boost. Short: meet with counselor Long: continue to exercise to help manage stress      Interventions Encouraged to attend Pulmonary Rehabilitation for the exercise --      Continue Psychosocial Services  Follow up required by staff --             Psychosocial Discharge (Final Psychosocial Re-Evaluation):  Psychosocial Re-Evaluation - 05/11/20 0932      Psychosocial Re-Evaluation   Current issues with Current Stress Concerns    Comments Susan Callahan Callahan finds it hard to deal with everything.  Her Dr is supposed to hook her up with a Social worker.  She  is Callahan sleeping ok.  She does feel exercise helps with her stamina and stress level.    Expected Outcomes Short: meet with counselor Long: continue to exercise to help manage stress           Education: Education Goals: Education classes will be provided on a weekly basis, covering required topics. Participant will state understanding/return demonstration of topics presented.  Learning Barriers/Preferences:  Learning  Barriers/Preferences - 03/11/20 1040      Learning Barriers/Preferences   Learning Barriers Sight    Learning Preferences None           General Pulmonary Education Topics:  Infection Prevention: - Provides verbal and written material to individual with discussion of infection control including proper hand washing and proper equipment cleaning during exercise session.   Pulmonary Rehab from 04/29/2020 in [facility] Cardiac and Pulmonary Rehab  Date 03/11/20  Educator [facility]  Instruction Review Code 1- Verbalizes Understanding      Falls Prevention: - Provides verbal and written material to individual with discussion of falls prevention and safety.   Pulmonary Rehab [facility]29/[facility]  Date 03/11/20  Educator [facility]  Instruction Review Code 1- Verbalizes Understanding      Chronic Lung Diseases: - Group verbal and written instruction to review updates, respiratory medications, advancements in procedures and treatments. Discuss use of supplemental oxygen including available portable oxygen systems, continuous and intermittent flow rates, concentrators, personal use and safety guidelines. Review proper use of inhaler and spacers. Provide informative websites for self-education.    Pulmonary Rehab from 07/19/2018 in [facility] Cardiac and Pulmonary Rehab  Date 05/24/18  Educator [facility]  Instruction Review Code 1- Verbalizes Understanding      Energy Conservation: - Provide group verbal and written instruction for methods to conserve energy, plan and organize activities. Instruct on pacing techniques, use of adaptive equipment and posture/positioning to relieve shortness of breath.   Pulmonary Rehab from 07/19/2018 in [facility] Cardiac and Pulmonary Rehab  Date 07/10/18  Educator [facility]  Instruction Review Code 1- Verbalizes Understanding      Triggers and Exacerbations: - Group verbal and written instruction to review types of environmental triggers and ways to prevent  exacerbations. Discuss weather changes, air quality and the benefits of nasal washing. Review warning signs and symptoms to help prevent infections. Discuss techniques for effective airway clearance, coughing, and vibrations.   Pulmonary Rehab from 07/19/2018 in [facility] Cardiac and Pulmonary Rehab  Date 06/12/18  Educator [facility]  Instruction Review Code 1- Verbalizes Understanding      AED/CPR: - Group verbal and written instruction with the use of models to demonstrate the basic use of the AED with the basic ABC's of resuscitation.   Pulmonary Rehab from 07/19/2018 in [facility] Cardiac and Pulmonary Rehab  Date 07/19/18  Educator [facility]  Instruction Review Code 1- Actuary and Physiology of the Lungs: - Group verbal and written instruction with the use of models to provide basic lung anatomy and physiology related to function, structure and complications of lung disease.   Pulmonary Rehab from 07/19/2018 in [facility] Cardiac and Pulmonary Rehab  Date 05/08/18  Educator [facility]  Instruction Review Code 1- Verbalizes Understanding      Anatomy & Physiology of the Heart: - Group verbal and written instruction and models provide basic cardiac anatomy and physiology, with the coronary electrical and arterial systems. Review of Valvular disease and Heart Failure   Pulmonary Rehab from 07/19/2018 in [facility] Cardiac  and Pulmonary Rehab  Date 06/21/18  Educator [facility]  Instruction Review Code 1- Verbalizes Understanding      Cardiac Medications: - Group verbal and written instruction to review commonly prescribed medications for heart disease. Reviews the medication, class of the drug, and side effects.   Pulmonary Rehab from 04/29/2020 in [facility] Cardiac and Pulmonary Rehab  Date 04/08/20  Educator SB  Instruction Review Code 1- Verbalizes Understanding      Other: -Provides group and verbal instruction on various topics (see comments)   Knowledge Questionnaire Score:  Knowledge  Questionnaire Score - 03/18/20 1055      Knowledge Questionnaire Score   Pre Score 16/18 Education: O2 Safety            Core Components/Risk Factors/Patient Goals at Admission:  Personal Goals and Risk Factors at Admission - 03/18/20 1055      Core Components/Risk Factors/Patient Goals on Admission    Weight Management Yes;Weight Loss;Obesity    Intervention Weight Management: Develop a combined nutrition and exercise program designed to reach desired caloric intake, while maintaining appropriate intake of nutrient and fiber, sodium and fats, and appropriate energy expenditure required for the weight goal.;Weight Management: Provide education and appropriate resources to help participant work on and attain dietary goals.;Weight Management/Obesity: Establish reasonable short term and long term weight goals.;Obesity: Provide education and appropriate resources to help participant work on and attain dietary goals.    Admit Weight 203 lb 14.4 oz (92.5 kg)    Goal Weight: Short Term 198 lb (89.8 kg)    Goal Weight: Long Term 190 lb (86.2 kg)    Expected Outcomes Short Term: Continue to assess and modify interventions until short term weight is achieved;Long Term: Adherence to nutrition and physical activity/exercise program aimed toward attainment of established weight goal;Weight Loss: Understanding of general recommendations for a balanced deficit meal plan, which promotes 1-2 lb weight loss per week and includes a negative energy balance of [phone] kcal/d;Understanding recommendations for meals to include 15-35% energy as protein, 25-35% energy from fat, 35-60% energy from carbohydrates, less than 272m of dietary cholesterol, 20-35 gm of total fiber daily;Understanding of distribution of calorie intake throughout the day with the consumption of 4-5 meals/snacks    Improve shortness of breath with ADL's Yes    Intervention Provide education, individualized exercise plan and daily activity  instruction to help decrease symptoms of SOB with activities of daily living.    Expected Outcomes Short Term: Improve cardiorespiratory fitness to achieve a reduction of symptoms when performing ADLs;Long Term: Be able to perform more ADLs without symptoms or delay the onset of symptoms    Diabetes Yes    Intervention Provide education about signs/symptoms and action to take for hypo/hyperglycemia.;Provide education about proper nutrition, including hydration, and aerobic/resistive exercise prescription along with prescribed medications to achieve blood glucose in normal ranges: Fasting glucose 65-99 mg/dL    Expected Outcomes Long Term: Attainment of HbA1C < 7%.;Short Term: Participant verbalizes understanding of the signs/symptoms and immediate care of hyper/hypoglycemia, proper foot care and importance of medication, aerobic/resistive exercise and nutrition plan for blood glucose control.    Hypertension Yes    Intervention Provide education on lifestyle modifcations including regular physical activity/exercise, weight management, moderate sodium restriction and increased consumption of fresh fruit, vegetables, and low fat dairy, alcohol moderation, and smoking cessation.;Monitor prescription use compliance.    Expected Outcomes Short Term: Continued assessment and intervention until BP is < 140/946mHG in hypertensive participants. < 130/8077mG in hypertensive participants  with diabetes, heart failure or chronic kidney disease.;Long Term: Maintenance of blood pressure at goal levels.    Lipids Yes    Intervention Provide education and support for participant on nutrition & aerobic/resistive exercise along with prescribed medications to achieve LDL <55m, HDL >481m    Expected Outcomes Short Term: Participant states understanding of desired cholesterol values and is compliant with medications prescribed. Participant is following exercise prescription and nutrition guidelines.;Long Term: Cholesterol  controlled with medications as prescribed, with individualized exercise RX and with personalized nutrition plan. Value goals: LDL < 706mHDL > 40 mg.           Education:Diabetes - Individual verbal and written instruction to review signs/symptoms of diabetes, desired ranges of glucose level fasting, after meals and with exercise. Acknowledge that pre and post exercise glucose checks will be done for 3 sessions at entry of program.   [facility]  Date 03/11/20  [facility] Code 1- Verbalizes Understanding      Education: Know Your Numbers and Risk Factors: -Group verbal and written instruction about important numbers in your health.  Discussion of what are risk factors and how they play a role in the disease process.  Review of Cholesterol, Blood Pressure, Diabetes, and BMI and the role they play in your overall health.   Core Components/Risk Factors/Patient Goals Review:   Goals and Risk Factor Review    Row Name 04/06/20 0940 05/11/20 0929           Core Components/Risk Factors/Patient Goals Review   Personal Goals Review Weight Management/Obesity;Improve shortness of breath with ADL's;Diabetes;Lipids Weight Management/Obesity;Improve shortness of breath with ADL's;Diabetes;Lipids      Review Susan Callahan doing well in rehab.  Her weight has been fluctuating and she is frustrated that she can't do all that she wants to do. Her blood sugars have been doing well and so have her pressures. Susan Callahan Callahan monitoring BP and BG at home.  She had first infusion 3 weeks ago and has next one tomorrow.  She may have had an allergic reaction to the treatment and she will follow up with Dr about that.      Expected Outcomes Short: Continue to work on her weight Long: Continue to manage lung disease. Short:  continue to monitor BG and BP Long:  manage lung disease and risk factors             Core Components/Risk  Factors/Patient Goals at Discharge (Final Review):   Goals and Risk Factor Review - 05/11/20 0929      Core Components/Risk Factors/Patient Goals Review   Personal Goals Review Weight Management/Obesity;Improve shortness of breath with ADL's;Diabetes;Lipids    Review Susan Callahan monitoring BP and BG at home.  She had first infusion 3 weeks ago and has next one tomorrow.  She may have had an allergic reaction to the treatment and she will follow up with Dr about that.    Expected Outcomes Short:  continue to monitor BG and BP Long:  manage lung disease and risk factors           ITP Comments:  ITP Comments    Row Name 03/11/20 1045 03/18/20 1023 03/24/20 0634 04/20/20 1521 04/21/20 0543   ITP Comments Virtual Visit completed. Patient informed on EP and RD appointment and 6 Minute walk test. Patient also informed of patient health questionnaires on My Chart. Patient Verbalizes understanding. Visit diagnosis can be found  in [facility] 01/07/2020. Completed 6MWT and gym orientation. Initial ITP created and sent for review to Dr. Emily Filbert, Medical Director. 30 Day review completed. Medical Director ITP review done, changes made as directed, and signed approval by Medical Director. [place] called out today, she started her cancer treatment infusions yesterday. 30 Day review completed. Medical Director ITP review done, changes made as directed, and signed approval by Medical Director.   [place] Name 04/22/20 0857 05/19/20 1157         ITP Comments Pt called to report that she was Callahan feeling fatigued but better. She had spent the day at [facility] yesterday which did not help.  She is going to come to her lymphedema appt today and try for rehab next week. 30 day review completed. ITP sent to Dr. Emily Filbert, Medical Director of Cardiac and Pulmonary Rehab. Continue with ITP unless changes are made by physician.             Comments: 30 day review

## 2020-05-20 ENCOUNTER — Encounter: Payer: Medicare Other | Attending: Student | Admitting: *Deleted

## 2020-05-20 ENCOUNTER — Other Ambulatory Visit: Payer: Self-pay

## 2020-05-20 ENCOUNTER — Ambulatory Visit: Payer: Medicare Other | Attending: Rehabilitation | Admitting: Occupational Therapy

## 2020-05-20 DIAGNOSIS — E119 Type 2 diabetes mellitus without complications: Secondary | ICD-10-CM | POA: Insufficient documentation

## 2020-05-20 DIAGNOSIS — B0221 Postherpetic geniculate ganglionitis: Secondary | ICD-10-CM | POA: Diagnosis not present

## 2020-05-20 DIAGNOSIS — J449 Chronic obstructive pulmonary disease, unspecified: Secondary | ICD-10-CM | POA: Diagnosis not present

## 2020-05-20 DIAGNOSIS — Z79899 Other long term (current) drug therapy: Secondary | ICD-10-CM | POA: Diagnosis not present

## 2020-05-20 DIAGNOSIS — I10 Essential (primary) hypertension: Secondary | ICD-10-CM | POA: Diagnosis not present

## 2020-05-20 DIAGNOSIS — F329 Major depressive disorder, single episode, unspecified: Secondary | ICD-10-CM | POA: Diagnosis not present

## 2020-05-20 DIAGNOSIS — Z7984 Long term (current) use of oral hypoglycemic drugs: Secondary | ICD-10-CM | POA: Diagnosis not present

## 2020-05-20 DIAGNOSIS — Z87891 Personal history of nicotine dependence: Secondary | ICD-10-CM | POA: Insufficient documentation

## 2020-05-20 DIAGNOSIS — I972 Postmastectomy lymphedema syndrome: Secondary | ICD-10-CM | POA: Insufficient documentation

## 2020-05-20 DIAGNOSIS — R0602 Shortness of breath: Secondary | ICD-10-CM | POA: Insufficient documentation

## 2020-05-20 NOTE — Therapy (Signed)
[place] MAIN [facility] SERVICES [address] [place], [place], [address] Phone: [phone]   Fax:  [phone]  Occupational Therapy Treatment  Patient Details  Name: Susan Callahan MRN: [id number] Date of Birth: [birth year] No data recorded  Encounter Date: 05/20/2020   OT End of Session - 05/20/20 1540    Visit Number 4    Number of Visits 36    Date for OT Re-Evaluation 07/13/20    Authorization Type new POC requesting 36 additional visits 04/13/20    OT Start Time 0205    OT Stop Time 0315    OT Time Calculation (min) 70 min    Activity Tolerance Patient tolerated treatment well;No increased pain;Patient limited by fatigue    Behavior During Therapy [facility] for tasks assessed/performed           Past Medical History:  Diagnosis Date  . COPD (chronic obstructive pulmonary disease) ([place])   . Depression 1974  . Diabetes mellitus without complication ([place])   . Hypertension   . Migraine   . 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 LUMBAR PERITONEAL SHUNT    . TYMPANOSTOMY TUBE PLACEMENT      There were no vitals filed for this visit.   Subjective Assessment - 05/20/20 1536    Subjective  Pt presents for OT visit 4/36 to address RUE/ RUQ post-mastectomy lymphedema. Pt denies LE-related pain today, but endorses improving fatique since last chemo. Pt to get port on Monday.    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 01/30/19 partial mastectomy. Margins clear. 0/2 LN  negative for  disease. actinic keritosis,DDD, DM, obesity, thyroid nodules, pulmonary nodules, chronic back pain, HTN, COPD, hx depression    Limitations pain and swelling in R axilla and arm, chronic back pain, muscle weakness, impaired balance, decreased RUE AROM, abnormal gait, difficulty walking, impaired functional mobility and transfers,    Repetition Increases Symptoms     Special Tests slight positive Stemmer at R MPs    Patient Stated Goals relduce swelling and pain in my R arm and hand and keep it from getting worse so I can do what I need to do    Pain Onset More than a month ago                        OT Treatments/Exercises (OP) - 05/20/20 0001      ADLs   ADL Education Given Yes      Manual Therapy   Manual Therapy Edema management;Manual Lymphatic Drainage (MLD)    Manual Lymphatic Drainage (MLD) MLD to RUE/RUQ in supine utilizing AAA anastamosis , shoulder and suprerclavicular pathways. Pt demonstrated minimal SOB with head at ~ 45 degrees. Pt reports MLD provided analgesic effect today. Cont as per POC.    Compression Bandaging Pt independentlly dons existing compression sleeve and glove at end of session                  OT Education - 05/20/20 1539    Education Details Continued skilled Pt/caregiver education  And LE ADL training throughout visit for lymphedema self care/ home program, including compression wrapping, compression garment and device wear/care, lymphatic pumping ther ex, simple self-MLD, and skin care. Discussed progress towards goals.    Person(s) Educated Patient    Methods Demonstration;Explanation    Comprehension Verbalized understanding;Returned demonstration  OT Long Term Goals - 04/14/20 1000      OT LONG TERM GOAL #1   Title Pt modified independent using printed reference  with lymphedema precautions and prevention strategies to limit LE progression.    Baseline min A    Time 12    Period Weeks    Status On-going    Target Date 07/13/20      OT LONG TERM GOAL #2   Title Pt will be modified independent with all lymphedema self-care home program acomponents, including self-MLD ( with Flexitouch device 1 x q day), daily custom compression glove and sleeve, daily skin care and lymphatic pumping ther ex using assistive devices and with extra time in keeping with energy  conservation and work simplification principals.      Baseline Mod A    Time 12    Period Weeks    Status New    Target Date 07/13/20      OT LONG TERM GOAL #3   Title Pt wil report 0/10 pain with RUE functional use ( reaching, lifting, carrying, dressing, dressing, etc) to achieve independent , pain free ADLs performance.    Baseline min A    Time 12    Period Weeks    Status New    Target Date 07/13/20      OT LONG TERM GOAL #4   Title Pt able to don and doff all custom compression garments and devices usiing assistive devices and/or extra time PRN to ensure optimal LE symptom control and to limit progression    Baseline supervision    Time 12    Period Weeks    Status On-going    Target Date 07/13/20      OT LONG TERM GOAL #6   Title Pt will retain all clinical gains achieved during Intensive Phase CDT to limit lLE progression and further functional decline as evidenced by no more than 3% upward volume fluctuation in RUE/RUQ  over time.    Baseline Mod A    Time 12    Period Weeks    Status New    Target Date 07/13/20                  Patient will benefit from skilled therapeutic intervention in order to improve the following deficits and impairments:           Visit Diagnosis: Postmastectomy lymphedema syndrome    Problem List Patient Active Problem List   Diagnosis Date Noted  . Otalgia of left ear 08/11/2019  . Edema 04/16/2019  . Postoperative wound infection 03/13/2019  . Seroma of breast 03/11/2019  . Age-related osteoporosis without current pathological fracture 02/27/2019  . Malignant neoplasm of central portion of right breast in female, estrogen receptor positive ([place]) 02/12/2019  . Genetic testing 01/31/2019  . Malignant neoplasm of upper-inner quadrant of right breast in female, estrogen receptor positive ([place]) 01/15/2019  . Long term current use of opiate analgesic 12/02/2018  . Tympanic membrane central perforation, left 11/04/2018  .  DDD (degenerative disc disease), lumbosacral 10/10/2018  . Abnormal MRI, lumbar spine (08/29/2018) 10/10/2018  . 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 Lumbar nerve root impingement (Bilateral: L4 & L5) (L>R: L5) 10/10/2018  . Lumbar Spinal instability of L4-5 10/10/2018  . Lumbar facet hypertrophy 10/10/2018  . COPD (chronic obstructive pulmonary disease) with emphysema ([place]) 08/28/2018  . Elevated rheumatoid factor 08/13/2018  . Elevated C-reactive protein (CRP) 08/12/2018  . Elevated sed  rate 08/12/2018  . Lumbar facet syndrome (Bilateral) 08/12/2018  . Back pain with left-sided radiculopathy 08/12/2018  . DDD (degenerative disc disease), lumbar 08/12/2018  . Chronic lower extremity pain (Primary Area of Pain) (Bilateral) (L>R) 07/23/2018  . Chronic low back pain (Secondary Area of Pain) (Bilateral) (L>R) w/ sciatica (Bilateral) 07/23/2018  . Chronic pain syndrome 07/23/2018  . Pharmacologic therapy 07/23/2018  . Disorder of skeletal system 07/23/2018  . Problems influencing health status 07/23/2018  . Chronic sacroiliac joint pain [facility] Area of Pain) (Bilateral) (L>R) 07/23/2018  . Status post lumboperitoneal shunt placement 02/26/2018  . 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 Tympanic membrane perforation 05/30/2016  . PMB (postmenopausal bleeding) 04/26/2016  . Rash 12/27/2015  . 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 Plantar fasciitis of foot (Left) 05/26/2015  . Spondylosis of lumbar region without myelopathy or radiculopathy 05/26/2015  . Lumbar foraminal stenosis (L3-4, L4-5, L5-S1) (L>R) 05/26/2015  . Spinal stenosis of lumbar region with neurogenic claudication 05/26/2015  . Encounter for screening mammogram for malignant neoplasm of breast 12/02/2014  . Goiter 12/02/2014  . Chronic idiopathic gout of foot (Right) 07/06/2014  . Biceps tendonitis  (Right) 05/19/2014  . Shoulder pain (Right) 05/19/2014  . Facial weakness 05/05/2014  . Post herpetic neuralgia 05/05/2014  . Chronic dysfunction of left eustachian tube 03/24/2014  . Osteopenia 03/13/2014  . Lumbar Grade 1 Anterolisthesis of L3/4 (4mm) & L4/5 (4-3mm)(w/ Dynamic instability) 03/13/2014  . Family history of colon cancer 12/02/2013  . Dyshidrotic eczema 11/17/2013  . Diastasis recti 04/08/2013  . Facial nerve paresis 07/31/2012  . Blepharospasm 05/08/2012  . 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 Obesity 03/28/2012  . Hemoptysis 12/22/2011  . Neuropathic postherpetic trigeminal neuralgia 12/12/2011  . Allergic rhinitis, mild 10/12/2011  . Disequilibrium syndrome 09/27/2011  . Chronic bronchitis ([place]) 06/05/2011  . Essential hypertension 05/24/2011  . Type 2 diabetes mellitus without complications ([place]) 35/00/9381  . Pulmonary nodules 05/05/2011    Andrey Spearman, MS, OTR/L, [facility] 05/20/20 3:41 PM  [place] MAIN [facility] SERVICES [address] [place], [place], [address] Phone: [phone]   Fax:  [phone]  Name: Susan Callahan MRN: [id number] Date of Birth: March 01, 1951

## 2020-05-20 NOTE — Progress Notes (Signed)
Daily Session Note  Patient Details  Name: Susan Callahan MRN: [id number] Date of Birth: [birth year] Referring Provider:     Pulmonary Rehab from 03/18/2020 in [facility] Cardiac and Pulmonary Rehab  Referring Provider [name] MD      Encounter Date: 05/20/2020  Check In:  Session Check In - 05/20/20 0931      Check-In   Supervising physician immediately available to respond to emergencies See telemetry face sheet for immediately available ER MD    Location [facility]-Cardiac & Pulmonary Rehab    Staff Present Heath Lark, RN, BSN, Jacklynn Bue, MS Exercise Physiologist;Amanda Oletta Darter, [place], ACSM CEP, Exercise Physiologist    Virtual Visit No    Medication changes reported     No    Fall or balance concerns reported    No    Warm-up and Cool-down Performed on first and last piece of equipment    Resistance Training Performed Yes    VAD Patient? No    PAD/SET Patient? No      Pain Assessment   Currently in Pain? No/denies              Social History   Tobacco Use  Smoking Status Former Smoker  . Packs/day: 3.00  . Years: 27.00  . Pack years: 81.00  . Types: Cigarettes  . Quit date: 03/15/1991  . Years since quitting: 29.2  Smokeless Tobacco Never Used    Goals Met:  Proper associated with RPD/PD & O2 Sat Independence with exercise equipment Exercise tolerated well No report of cardiac concerns or symptoms  Goals Unmet:  Not Applicable  Comments: Pt able to follow exercise prescription today without complaint.  Will continue to monitor for progression.    Dr. Emily Filbert is Medical Director for [facility].

## 2020-05-27 ENCOUNTER — Ambulatory Visit: Payer: Medicare Other | Admitting: Occupational Therapy

## 2020-06-01 ENCOUNTER — Encounter: Payer: Medicare Other | Admitting: *Deleted

## 2020-06-01 ENCOUNTER — Other Ambulatory Visit: Payer: Self-pay

## 2020-06-01 DIAGNOSIS — R0602 Shortness of breath: Secondary | ICD-10-CM | POA: Diagnosis not present

## 2020-06-01 NOTE — Progress Notes (Signed)
Daily Session Note  Patient Details  Name: Celinda Dethlefs MRN: [id number] Date of Birth: Jul 29, 1951 Referring Provider:     Pulmonary Rehab from 03/18/2020 in [facility] Cardiac and Pulmonary Rehab  Referring Provider [name] MD      Encounter Date: 06/01/2020  Check In:  Session Check In - 06/01/20 1032      Check-In   Supervising physician immediately available to respond to emergencies See telemetry face sheet for immediately available ER MD    Location [facility]-Cardiac & Pulmonary Rehab    Staff Present Heath Lark, RN, BSN, Jacklynn Bue, MS Exercise Physiologist;Amanda Oletta Darter, [place], ACSM CEP, Exercise Physiologist    Virtual Visit No    Medication changes reported     No    Fall or balance concerns reported    No    Warm-up and Cool-down Performed on first and last piece of equipment    Resistance Training Performed Yes    VAD Patient? No    PAD/SET Patient? No      Pain Assessment   Currently in Pain? No/denies              Social History   Tobacco Use  Smoking Status Former Smoker  . Packs/day: 3.00  . Years: 27.00  . Pack years: 81.00  . Types: Cigarettes  . Quit date: 03/15/1991  . Years since quitting: 29.2  Smokeless Tobacco Never Used    Goals Met:  Proper associated with RPD/PD & O2 Sat Independence with exercise equipment Exercise tolerated well No report of cardiac concerns or symptoms  Goals Unmet:  Not Applicable  Comments: Pt able to follow exercise prescription today without complaint.  Will continue to monitor for progression.    Dr. Emily Filbert is Medical Director for [place].

## 2020-06-02 ENCOUNTER — Ambulatory Visit: Payer: Medicare Other | Admitting: Pain Medicine

## 2020-06-03 ENCOUNTER — Other Ambulatory Visit: Payer: Self-pay

## 2020-06-03 ENCOUNTER — Encounter: Payer: Medicare Other | Admitting: *Deleted

## 2020-06-03 DIAGNOSIS — R0602 Shortness of breath: Secondary | ICD-10-CM

## 2020-06-03 NOTE — Progress Notes (Signed)
Incomplete Session Note  Patient Details  Name: Susan Callahan MRN: [id number] Date of Birth: 04-03-1951 Referring Provider:     Pulmonary Rehab from 03/18/2020 in [facility] Cardiac and Pulmonary Rehab  Referring Provider [name] MD      Timoteo Ace did not complete her rehab session.  Anshu was just started on Eliquis for a blood clot in her neck.

## 2020-06-07 DIAGNOSIS — D72819 Decreased white blood cell count, unspecified: Secondary | ICD-10-CM | POA: Insufficient documentation

## 2020-06-07 DIAGNOSIS — I2690 Septic pulmonary embolism without acute cor pulmonale: Secondary | ICD-10-CM | POA: Insufficient documentation

## 2020-06-07 DIAGNOSIS — I21A1 Myocardial infarction type 2: Secondary | ICD-10-CM | POA: Insufficient documentation

## 2020-06-07 DIAGNOSIS — A4102 Sepsis due to Methicillin resistant Staphylococcus aureus: Secondary | ICD-10-CM | POA: Insufficient documentation

## 2020-06-07 DIAGNOSIS — J9621 Acute and chronic respiratory failure with hypoxia: Secondary | ICD-10-CM | POA: Insufficient documentation

## 2020-06-07 DIAGNOSIS — C799 Secondary malignant neoplasm of unspecified site: Secondary | ICD-10-CM | POA: Insufficient documentation

## 2020-06-07 DIAGNOSIS — I82A12 Acute embolism and thrombosis of left axillary vein: Secondary | ICD-10-CM | POA: Insufficient documentation

## 2020-06-07 DIAGNOSIS — R7881 Bacteremia: Secondary | ICD-10-CM | POA: Insufficient documentation

## 2020-06-10 ENCOUNTER — Encounter: Payer: Self-pay | Admitting: *Deleted

## 2020-06-10 ENCOUNTER — Ambulatory Visit: Payer: Medicare Other | Admitting: Occupational Therapy

## 2020-06-10 DIAGNOSIS — R0602 Shortness of breath: Secondary | ICD-10-CM

## 2020-06-10 NOTE — Progress Notes (Signed)
Dawnmarie has ben admitted to [facility] with port infection. Remains in hospital as of 9/30

## 2020-06-15 ENCOUNTER — Encounter: Payer: Self-pay | Admitting: *Deleted

## 2020-06-15 DIAGNOSIS — R0602 Shortness of breath: Secondary | ICD-10-CM

## 2020-06-16 ENCOUNTER — Encounter: Payer: Self-pay | Admitting: *Deleted

## 2020-06-16 DIAGNOSIS — R0602 Shortness of breath: Secondary | ICD-10-CM

## 2020-06-16 NOTE — Progress Notes (Signed)
Pulmonary Individual Treatment Plan  Patient Details  Name: Susan Callahan MRN: [id number] Date of Birth: Mar 26, 1951 Referring Provider:     Pulmonary Rehab from 03/18/2020 in [facility] Cardiac and Pulmonary Rehab  Referring Provider [name] MD      Initial Encounter Date:    Pulmonary Rehab from 03/18/2020 in [facility] Cardiac and Pulmonary Rehab  Date 03/18/20      Visit Diagnosis: Shortness of breath  Patient's Home Medications on Admission:  Current Outpatient Medications:  .  acetaZOLAMIDE (DIAMOX) 250 MG tablet, Take 250 mg by mouth daily. , Disp: , Rfl:  .  allopurinol (ZYLOPRIM) 300 MG tablet, Take 300 mg by mouth daily. , Disp: , Rfl:  .  amitriptyline (ELAVIL) 10 MG tablet, Take 10 mg by mouth at bedtime. , Disp: , Rfl:  .  atorvastatin (LIPITOR) 10 MG tablet, Take 10 mg by mouth daily., Disp: , Rfl:  .  b complex vitamins capsule, Take 1 capsule by mouth daily. , Disp: , Rfl:  .  Calcium Carbonate-Vitamin D 600-400 MG-UNIT tablet, Take 1 tablet by mouth daily. , Disp: , Rfl:  .  Cholecalciferol (VITAMIN D) 125 MCG (5000 UT) CAPS, Take 5,000 Units by mouth daily., Disp: , Rfl:  .  EPINEPHrine 0.3 mg/0.3 mL IJ SOAJ injection, Inject 0.3 mLs into the muscle as needed., Disp: , Rfl:  .  Exemestane (AROMASIN PO), Take 25 mg by mouth daily., Disp: , Rfl:  .  furosemide (LASIX) 40 MG tablet, Take 40 mg by mouth 2 (two) times daily.  (Patient not taking: Reported on 03/11/2020), Disp: , Rfl:  .  gabapentin (NEURONTIN) 600 MG tablet, Take 600 mg by mouth 2 (two) times daily. , Disp: , Rfl:  .  magnesium oxide (MAG-OX) 400 MG tablet, Take by mouth., Disp: , Rfl:  .  metFORMIN (GLUCOPHAGE-XR) 500 MG 24 hr tablet, Take 500 mg by mouth 2 (two) times daily. , Disp: , Rfl:  .  Multiple Vitamin (MULTIVITAMIN) capsule, Take 1 capsule by mouth daily. , Disp: , Rfl:  .  mupirocin ointment (BACTROBAN) 2 %, Apply two times a day for 7 days., Disp: 22 g, Rfl: 0 .  traMADol (ULTRAM) 50 MG tablet,  Take 1 tablet (50 mg total) by mouth every 6 (six) hours as needed for severe pain., Disp: 120 tablet, Rfl: 2 .  verapamil (CALAN-SR) 240 MG CR tablet, Take 240 mg by mouth daily. , Disp: , Rfl:   Past Medical History: Past Medical History:  Diagnosis Date  . COPD (chronic obstructive pulmonary disease) ([place])   . Depression 1974  . Diabetes mellitus without complication ([place])   . Hypertension   . Migraine   . 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 Packs/day: 3.00  . Years: 27.00  . Pack years: 81.00  . Types: Cigarettes  . Quit date: 03/15/1991  . Years since quitting: 29.2  Smokeless Tobacco Never Used    Labs: Recent Review Flowsheet Data   There is no flowsheet data to display.      Pulmonary Assessment Scores:  Pulmonary Assessment Scores    Row Name 03/18/20 1108         ADL UCSD   ADL Phase Entry     SOB Score total 64     Rest 1     Walk 2     Stairs 5     Bath 0  Dress 2     Shop 4       CAT Score   CAT Score 24       mMRC Score   mMRC Score 2            UCSD: Self-administered rating of dyspnea associated with activities of daily living (ADLs) 6-point scale (0 = "not at all" to 5 = "maximal or unable to do because of breathlessness")  Scoring Scores range from 0 to 120.  Minimally important difference is 5 units  CAT: CAT can identify the health impairment of COPD patients and is better correlated with disease progression.  CAT has a scoring range of zero to 40. The CAT score is classified into four groups of low (less than 10), medium (10 - 20), high (21-30) and very high (31-40) based on the impact level of disease on health status. A CAT score over 10 suggests significant symptoms.  A worsening CAT score could be explained by an exacerbation, poor medication adherence, poor inhaler technique, or progression of COPD or comorbid conditions.  CAT MCID is 2  points  mMRC: mMRC (Modified Medical Research Council) Dyspnea Scale is used to assess the degree of baseline functional disability in patients of respiratory disease due to dyspnea. No minimal important difference is established. A decrease in score of 1 point or greater is considered a positive change.   Pulmonary Function Assessment:   Exercise Target Goals: Exercise Program Goal: Individual exercise prescription set using results from initial 6 min walk test and THRR while considering  patient's activity barriers and safety.   Exercise Prescription Goal: Initial exercise prescription builds to 30-45 minutes a day of aerobic activity, 2-3 days per week.  Home exercise guidelines will be given to patient during program as part of exercise prescription that the participant will acknowledge.  Education: Aerobic Exercise & Resistance Training: - Gives group verbal and written instruction on the various components of exercise. Focuses on aerobic and resistive training programs and the benefits of this training and how to safely progress through these programs..   Pulmonary Rehab from 06/03/2020 in [facility] Cardiac and Pulmonary Rehab  Date 04/29/20  Educator [facility]  Instruction Review Code 1- Verbalizes Understanding      Education: Exercise & Equipment Safety: - Individual verbal instruction and demonstration of equipment use and safety with use of the equipment.   Pulmonary Rehab from 06/03/2020 in [facility] Cardiac and Pulmonary Rehab  Date 03/11/20  Educator [facility]  Instruction Review Code 1- Verbalizes Understanding      Education: Exercise Physiology & General Exercise Guidelines: - Group verbal and written instruction with models to review the exercise physiology of the cardiovascular system and associated critical values. Provides general exercise guidelines with specific guidelines to those with heart or lung disease.    Pulmonary Rehab from 07/19/2018 in [facility] Cardiac and Pulmonary Rehab   Date 06/05/18  Educator [facility]  Instruction Review Code 1- Verbalizes Understanding      Education: Flexibility, Balance, Mind/Body Relaxation: Provides group verbal/written instruction on the benefits of flexibility and balance training, including mind/body exercise modes such as yoga, pilates and tai chi.  Demonstration and skill practice provided.   Pulmonary Rehab from 07/19/2018 in [facility] Cardiac and Pulmonary Rehab  Date 07/03/18  Educator [facility]  Instruction Review Code 1- Verbalizes Understanding      Activity Barriers & Risk Stratification:  Activity Barriers & Cardiac Risk Stratification - 03/18/20 1036      Activity Barriers & Cardiac Risk Stratification  Activity Barriers Balance Concerns;Muscular Weakness;Deconditioning;Shortness of Breath;Back Problems           6 Minute Walk:  6 Minute Walk    Row Name 03/18/20 1026         6 Minute Walk   Phase Initial     Distance 700 feet     Walk Time 5.05 minutes     # of Rest Breaks 0     MPH 1.57     METS 1.64     RPE 15     Perceived Dyspnea  4     VO2 Peak 5.75     Symptoms Yes (comment)     Comments SOB     Resting HR 66 bpm     Resting BP 124/64     Resting Oxygen Saturation  94 %     Exercise Oxygen Saturation  during 6 min walk 83 %     Max Ex. HR 96 bpm     Max Ex. BP 154/70     2 Minute Post BP 146/74       Interval HR   1 Minute HR 81     2 Minute HR 88     3 Minute HR 94     4 Minute HR 88     5 Minute HR 88     6 Minute HR 96     2 Minute Post HR 73     Interval Heart Rate? Yes       Interval Oxygen   Interval Oxygen? Yes     Baseline Oxygen Saturation % 94 %     1 Minute Oxygen Saturation % 91 %     1 Minute Liters of Oxygen 3 L     2 Minute Oxygen Saturation % 85 %     2 Minute Liters of Oxygen 3 L     3 Minute Oxygen Saturation % 84 %  at 3:27 83%     3 Minute Liters of Oxygen 3 L     4 Minute Oxygen Saturation % 86 %     4 Minute Liters of Oxygen 3 L     5 Minute Oxygen Saturation %  89 %     5 Minute Liters of Oxygen 3 L     6 Minute Oxygen Saturation % 87 %     6 Minute Liters of Oxygen 3 L     2 Minute Post Oxygen Saturation % 95 %     2 Minute Post Liters of Oxygen 3 L           Oxygen Initial Assessment:  Oxygen Initial Assessment - 03/11/20 1037      Home Oxygen   Home Oxygen Device Home Concentrator;E-Tanks    Sleep Oxygen Prescription Continuous    Liters per minute 1    Home Exercise Oxygen Prescription Continuous    Liters per minute 2    Home Resting Oxygen Prescription Continuous    Liters per minute 1    Compliance with Home Oxygen Use Yes      Initial 6 min Walk   Oxygen Used Continuous    Liters per minute 2      Program Oxygen Prescription   Program Oxygen Prescription Continuous    Liters per minute 2      Intervention   Short Term Goals To learn and exhibit compliance with exercise, home and travel O2 prescription;To learn and understand importance of monitoring SPO2 with pulse oximeter and  demonstrate accurate use of the pulse oximeter.;To learn and understand importance of maintaining oxygen saturations>88%;To learn and demonstrate proper pursed lip breathing techniques or other breathing techniques.;To learn and demonstrate proper use of respiratory medications    Long  Term Goals Exhibits compliance with exercise, home and travel O2 prescription;Maintenance of O2 saturations>88%;Verbalizes importance of monitoring SPO2 with pulse oximeter and return demonstration;Exhibits proper breathing techniques, such as pursed lip breathing or other method taught during program session;Compliance with respiratory medication;Demonstrates proper use of MDI's           Oxygen Re-Evaluation:  Oxygen Re-Evaluation    Row Name 04/06/20 0935 05/11/20 0934 06/03/20 0930         Program Oxygen Prescription   Program Oxygen Prescription Continuous Continuous Continuous     Liters per minute _0 Home Oxygen   Home Oxygen Device Home  Concentrator;E-Tanks Home Concentrator;E-Tanks Home Concentrator     Sleep Oxygen Prescription Continuous Continuous Continuous     Liters per minute _1 Home Exercise Oxygen Prescription Continuous Continuous Continuous     Liters per minute _2 Home Resting Oxygen Prescription Continuous Continuous Continuous     Liters per minute _3 Compliance with Home Oxygen Use Yes Yes Yes       Goals/Expected Outcomes   Short Term Goals To learn and exhibit compliance with exercise, home and travel O2 prescription;To learn and understand importance of monitoring SPO2 with pulse oximeter and demonstrate accurate use of the pulse oximeter.;To learn and understand importance of maintaining oxygen saturations>88%;To learn and demonstrate proper pursed lip breathing techniques or other breathing techniques.;To learn and demonstrate proper use of respiratory medications To learn and exhibit compliance with exercise, home and travel O2 prescription;To learn and understand importance of monitoring SPO2 with pulse oximeter and demonstrate accurate use of the pulse oximeter.;To learn and understand importance of maintaining oxygen saturations>88%;To learn and demonstrate proper pursed lip breathing techniques or other breathing techniques.;To learn and demonstrate proper use of respiratory medications To learn and exhibit compliance with exercise, home and travel O2 prescription;To learn and understand importance of monitoring SPO2 with pulse oximeter and demonstrate accurate use of the pulse oximeter.;To learn and understand importance of maintaining oxygen saturations>88%;To learn and demonstrate proper pursed lip breathing techniques or other breathing techniques.;To learn and demonstrate proper use of respiratory medications     Long  Term Goals Exhibits compliance with exercise, home and travel O2 prescription;Maintenance of O2 saturations>88%;Verbalizes importance of monitoring SPO2 with pulse  oximeter and return demonstration;Exhibits proper breathing techniques, such as pursed lip breathing or other method taught during program session;Compliance with respiratory medication;Demonstrates proper use of MDI's Exhibits compliance with exercise, home and travel O2 prescription;Maintenance of O2 saturations>88%;Verbalizes importance of monitoring SPO2 with pulse oximeter and return demonstration;Exhibits proper breathing techniques, such as pursed lip breathing or other method taught during program session;Compliance with respiratory medication;Demonstrates proper use of MDI's Exhibits compliance with exercise, home and travel O2 prescription;Maintenance of O2 saturations>88%;Verbalizes importance of monitoring SPO2 with pulse oximeter and return demonstration;Exhibits proper breathing techniques, such as pursed lip breathing or other method taught during program session;Compliance with respiratory medication;Demonstrates proper use of MDI's     Comments Leronda is really struggling with her breathing and desaturating when she walks.  We talked about PLB and talking to the doctor to increase her liter flow. Tuleen uses 2L at home  most of the time as she is fairly active and doesnt sit for too long.  She uses PLB when needed.  She goes up to 3L when walking if needed as recommended by Dr. Vaughan Basta uses 2L at home most of the time she is fatigued with infusions.  She uses PLB when needed.  She still goes up to 3L when walking if needed as recommended by Dr.     Bertram Millard Outcomes Short: Talk to doctor to see if she can increase her liter flow.  Long; Continue to work on PLB. Short: continue to monitor oxygen when at home Long: continue to work on PLB --            Oxygen Discharge (Final Oxygen Re-Evaluation):  Oxygen Re-Evaluation - 06/03/20 0930      Program Oxygen Prescription   Program Oxygen Prescription Continuous    Liters per minute 2      Home Oxygen   Home Oxygen Device Home  Concentrator    Sleep Oxygen Prescription Continuous    Liters per minute 2    Home Exercise Oxygen Prescription Continuous    Liters per minute 2    Home Resting Oxygen Prescription Continuous    Liters per minute 2    Compliance with Home Oxygen Use Yes      Goals/Expected Outcomes   Short Term Goals To learn and exhibit compliance with exercise, home and travel O2 prescription;To learn and understand importance of monitoring SPO2 with pulse oximeter and demonstrate accurate use of the pulse oximeter.;To learn and understand importance of maintaining oxygen saturations>88%;To learn and demonstrate proper pursed lip breathing techniques or other breathing techniques.;To learn and demonstrate proper use of respiratory medications    Long  Term Goals Exhibits compliance with exercise, home and travel O2 prescription;Maintenance of O2 saturations>88%;Verbalizes importance of monitoring SPO2 with pulse oximeter and return demonstration;Exhibits proper breathing techniques, such as pursed lip breathing or other method taught during program session;Compliance with respiratory medication;Demonstrates proper use of MDI's    Comments Xan uses 2L at home most of the time she is fatigued with infusions.  She uses PLB when needed.  She still goes up to 3L when walking if needed as recommended by Dr.           Initial Exercise Prescription:  Initial Exercise Prescription - 03/18/20 1000      Date of Initial Exercise RX and Referring Provider   Date 03/18/20    Referring Provider [name] MD      Oxygen   Oxygen Continuous    Liters 3-4      Treadmill   MPH 1.5    Grade 0    Minutes 15    METs 2.15      NuStep   Level 1    SPM 80    Minutes 15    METs 1.6      REL-XR   Level 1    Speed 50    Minutes 15    METs 1.6      Prescription Details   Frequency (times per week) 2    Duration Progress to 30 minutes of continuous aerobic without signs/symptoms of physical distress        Intensity   THRR 40-80% of Max Heartrate 100-134    Ratings of Perceived Exertion 11-13    Perceived Dyspnea 0-4      Progression   Progression Continue to progress workloads to maintain intensity without signs/symptoms of physical distress.  Resistance Training   Training Prescription Yes    Weight 3 lb    Reps 10-15           Perform Capillary Blood Glucose checks as needed.  Exercise Prescription Changes:  Exercise Prescription Changes    Row Name 03/18/20 1000 04/06/20 1300 04/20/20 1500 05/04/20 1500 05/18/20 1500     Response to Exercise   Blood Pressure (Admit) 124/64 132/62 124/60 134/70 134/72   Blood Pressure (Exercise) 154/70 138/62 138/64 130/70 130/70   Blood Pressure (Exit) 136/70 128/62 150/90 128/64 122/62   Heart Rate (Admit) 66 bpm 72 bpm 82 bpm 80 bpm 84 bpm   Heart Rate (Exercise) 96 bpm 87 bpm 92 bpm 98 bpm 94 bpm   Heart Rate (Exit) 67 bpm 74 bpm 79 bpm 94 bpm 82 bpm   Oxygen Saturation (Admit) 94 % 92 % 94 % 94 % 89 %   Oxygen Saturation (Exercise) 83 % 86 % 90 % 90 % 88 %   Oxygen Saturation (Exit) 94 % 92 % 94 % 98 % 91 %   Rating of Perceived Exertion (Exercise) _0 Perceived Dyspnea (Exercise) _1 Symptoms SOB none none SOB SOB, fatigue   Comments walk test results -- -- -- --   Duration -- Progress to 30 minutes of  aerobic without signs/symptoms of physical distress Continue with 30 min of aerobic exercise without signs/symptoms of physical distress. Continue with 30 min of aerobic exercise without signs/symptoms of physical distress. Continue with 30 min of aerobic exercise without signs/symptoms of physical distress.   Intensity -- THRR unchanged THRR unchanged THRR unchanged THRR unchanged     Progression   Progression -- -- Continue to progress workloads to maintain intensity without signs/symptoms of physical distress. Continue to progress workloads to maintain intensity without signs/symptoms of physical  distress. Continue to progress workloads to maintain intensity without signs/symptoms of physical distress.   Average METs -- -- 2.18 2 2.6     Resistance Training   Training Prescription -- Yes Yes Yes Yes   Weight -- 3 lb 3 lb 3 lb 3 lb   Reps -- 10-15 10-15 10-15 10-15     Interval Training   Interval Training -- -- No No No     Oxygen   Oxygen -- Continuous Continuous Continuous Continuous   Liters -- 3-4 3-4 3-4 2-3     Treadmill   MPH -- -- 1.5 1.5 --   Grade -- -- 0 0 --   Minutes -- -- 15 15 --   METs -- -- 2.15 2.15 --     Recumbant Bike   Level -- 1 1 -- 1   RPM -- 60 -- -- --   Watts -- -- -- -- 18   Minutes -- 15 15 -- 15   METs -- 2.85 -- -- 2.6     NuStep   Level -- 2 4 -- 4   SPM -- 80 -- -- --   Minutes -- 15 15 -- 15   METs -- 2.3 2.2 -- 2.5     T5 Nustep   Level -- -- 1 1 --   SPM -- -- -- 80 --   Minutes -- -- 15 15 --   METs -- -- -- 1.9 --   Row Name 06/02/20 1300             Response to Exercise   Blood Pressure (  Admit) 128/60       Blood Pressure (Exercise) 142/64       Blood Pressure (Exit) 124/62       Heart Rate (Admit) 79 bpm       Heart Rate (Exercise) 91 bpm       Heart Rate (Exit) 80 bpm       Oxygen Saturation (Admit) 95 %       Oxygen Saturation (Exercise) 91 %       Oxygen Saturation (Exit) 94 %       Rating of Perceived Exertion (Exercise) 13       Perceived Dyspnea (Exercise) 2       Symptoms SOB       Duration Continue with 30 min of aerobic exercise without signs/symptoms of physical distress.       Intensity THRR unchanged         Progression   Progression Continue to progress workloads to maintain intensity without signs/symptoms of physical distress.       Average METs 2.5         Resistance Training   Training Prescription Yes       Weight 3 lb       Reps 10-15         Interval Training   Interval Training No         Oxygen   Oxygen Continuous       Liters 3         Treadmill   MPH 1.5       Grade  0       Minutes 15       METs 2.15              Exercise Comments:  Exercise Comments    Row Name 06/10/20 0926           Exercise Comments Isma has ben admitted to [facility] with port infection. Remains in hospital as of 9/30              Exercise Goals and Review:  Exercise Goals    Row Name 03/18/20 1053             Exercise Goals   Increase Physical Activity Yes       Intervention Provide advice, education, support and counseling about physical activity/exercise needs.;Develop an individualized exercise prescription for aerobic and resistive training based on initial evaluation findings, risk stratification, comorbidities and participant's personal goals.       Expected Outcomes Short Term: Attend rehab on a regular basis to increase amount of physical activity.;Long Term: Add in home exercise to make exercise part of routine and to increase amount of physical activity.;Long Term: Exercising regularly at least 3-5 days a week.       Increase Strength and Stamina Yes       Intervention Provide advice, education, support and counseling about physical activity/exercise needs.;Develop an individualized exercise prescription for aerobic and resistive training based on initial evaluation findings, risk stratification, comorbidities and participant's personal goals.       Expected Outcomes Short Term: Increase workloads from initial exercise prescription for resistance, speed, and METs.;Short Term: Perform resistance training exercises routinely during rehab and add in resistance training at home;Long Term: Improve cardiorespiratory fitness, muscular endurance and strength as measured by increased METs and functional capacity (6MWT)       Able to understand and use rate of perceived exertion (RPE) scale Yes       Intervention Provide  education and explanation on how to use RPE scale       Expected Outcomes Short Term: Able to use RPE daily in rehab to express subjective  intensity level;Long Term:  Able to use RPE to guide intensity level when exercising independently       Able to understand and use Dyspnea scale Yes       Intervention Provide education and explanation on how to use Dyspnea scale       Expected Outcomes Short Term: Able to use Dyspnea scale daily in rehab to express subjective sense of shortness of breath during exertion;Long Term: Able to use Dyspnea scale to guide intensity level when exercising independently       Knowledge and understanding of Target Heart Rate Range (THRR) Yes       Intervention Provide education and explanation of THRR including how the numbers were predicted and where they are located for reference       Expected Outcomes Short Term: Able to state/look up THRR;Short Term: Able to use daily as guideline for intensity in rehab;Long Term: Able to use THRR to govern intensity when exercising independently       Able to check pulse independently Yes       Intervention Provide education and demonstration on how to check pulse in carotid and radial arteries.;Review the importance of being able to check your own pulse for safety during independent exercise       Expected Outcomes Short Term: Able to explain why pulse checking is important during independent exercise;Long Term: Able to check pulse independently and accurately       Understanding of Exercise Prescription Yes       Intervention Provide education, explanation, and written materials on patient's individual exercise prescription       Expected Outcomes Short Term: Able to explain program exercise prescription;Long Term: Able to explain home exercise prescription to exercise independently              Exercise Goals Re-Evaluation :  Exercise Goals Re-Evaluation    Row Name 04/06/20 1339 04/20/20 1521 05/04/20 1546 05/11/20 0937 05/18/20 1544     Exercise Goal Re-Evaluation   Exercise Goals Review Increase Physical Activity;Increase Strength and Stamina Increase  Physical Activity;Increase Strength and Stamina;Understanding of Exercise Prescription Increase Physical Activity;Increase Strength and Stamina;Understanding of Exercise Prescription Increase Physical Activity;Increase Strength and Stamina;Understanding of Exercise Prescription Increase Physical Activity;Increase Strength and Stamina;Understanding of Exercise Prescription   Comments Reviewed RPE and dyspnea scales, THR and program prescription with pt today.  Pt voiced understanding and was given a copy of goals to take home. Marlisa has been doing well in rehab.  She has now started her cancer treatments and infusions, first was yesterday.  She called out today, but we will take each day as she comes and work on maintaining progress. Nithila does as much as she feels she can when she attends.  Staff will monitor progress. Laneya has done some chair exercise with her cousin outside program sessions.  Class is on Zoom. Gracia is doing well.  She has started her chemo infusions which will keep her out of class for a few days and then she returns.  She is up to level 4 on the NuStep.  We will continue to encourage good attendance and monitor her progress.   Expected Outcomes Short: Use RPE daily to regulate intensity. Long: Follow program prescription in THR. Short: Attend rehab regularly Long; Continue to maintain stamina. Short:  exercise consistently Long:  maintain stamina -- Short: continue to exercise even on days not in class Long: Continue to maintain and/or improve stamina   Row Name 06/02/20 1344 06/03/20 0923 06/15/20 1511         Exercise Goal Re-Evaluation   Exercise Goals Review Increase Physical Activity;Increase Strength and Stamina;Understanding of Exercise Prescription Increase Physical Activity;Increase Strength and Stamina;Understanding of Exercise Prescription --     Comments Ula says she feels good this week.  Her next infusion is next week.  Staff will monitor progress. Sha is struggling  with her infusions and exercising at home. Staff will monitor progress. Discussed on her good days to try and do at least some light activity. Out since last review     Expected Outcomes Short: exercise within tolerance Long: maintain or improve stamina Short: exercise within tolerance Long: maintain or improve stamina --            Discharge Exercise Prescription (Final Exercise Prescription Changes):  Exercise Prescription Changes - 06/02/20 1300      Response to Exercise   Blood Pressure (Admit) 128/60    Blood Pressure (Exercise) 142/64    Blood Pressure (Exit) 124/62    Heart Rate (Admit) 79 bpm    Heart Rate (Exercise) 91 bpm    Heart Rate (Exit) 80 bpm    Oxygen Saturation (Admit) 95 %    Oxygen Saturation (Exercise) 91 %    Oxygen Saturation (Exit) 94 %    Rating of Perceived Exertion (Exercise) 13    Perceived Dyspnea (Exercise) 2    Symptoms SOB    Duration Continue with 30 min of aerobic exercise without signs/symptoms of physical distress.    Intensity THRR unchanged      Progression   Progression Continue to progress workloads to maintain intensity without signs/symptoms of physical distress.    Average METs 2.5      Resistance Training   Training Prescription Yes    Weight 3 lb    Reps 10-15      Interval Training   Interval Training No      Oxygen   Oxygen Continuous    Liters 3      Treadmill   MPH 1.5    Grade 0    Minutes 15    METs 2.15           Nutrition:  Target Goals: Understanding of nutrition guidelines, daily intake of sodium <156m, cholesterol <2026m calories 30% from fat and 7% or less from saturated fats, daily to have 5 or more servings of fruits and vegetables.  Education: Controlling Sodium/Reading Food Labels -Group verbal and written material supporting the discussion of sodium use in heart healthy nutrition. Review and explanation with models, verbal and written materials for utilization of the food label.   [facility]  Date 05/29/18  Educator LB  Instruction Review Code 1- Verbalizes Understanding      Education: General Nutrition Guidelines/Fats and Fiber: -Group instruction provided by verbal, written material, models and posters to present the general guidelines for heart healthy nutrition. Gives an explanation and review of dietary fats and fiber.   [facility]  Date 05/20/20  Educator [place]  Instruction Review Code 1- Verbalizes Understanding      Biometrics:  Pre Biometrics - 03/18/20 1054      Pre Biometrics   Height _0  (1.651 m)    Weight 203 lb 14.4  oz (92.5 kg)    BMI (Calculated) 33.93    Single Leg Stand 5.88 seconds            Nutrition Therapy Plan and Nutrition Goals:  Nutrition Therapy & Goals - 04/13/20 0926      Nutrition Therapy   Diet High kcal, High Protein    Protein (specify units) 110-115g (COPD and current cancer treatment with recent 20 lb unintentional weight loss)    Fiber 25 grams    Whole Grain Foods 2 servings    Saturated Fats 12 max. grams    Fruits and Vegetables 5 servings/day    Sodium 1.5 grams      Personal Nutrition Goals   Nutrition Goal ST: eat small frequent meals, add fat to meals she is already eating, eat high quality protein at each meal LT: Maintain health and weight going through chemo    Comments Lung Cancer starting chemo 8/9 and immunotherapy. Skips breakfast - not hungry and nothing is appealing- may have some cereal or some fruit and cool whip. Starts feeling like eaitng around 11am. Discussed high calorie and high protein nutrition therapy and discussed expectations for cancer treatment. Discussed T2DM nutrition therapy. Pt does not have an RD following her for cancer treatment - encouraged her to have an Rd to follow her - she is being treated with [facility] and [facility] - will contact [place] RD.      Intervention Plan    Intervention Prescribe, educate and counsel regarding individualized specific dietary modifications aiming towards targeted core components such as weight, hypertension, lipid management, diabetes, heart failure and other comorbidities.;Nutrition handout(s) given to patient.    Expected Outcomes Short Term Goal: Understand basic principles of dietary content, such as calories, fat, sodium, cholesterol and nutrients.;Short Term Goal: A plan has been developed with personal nutrition goals set during dietitian appointment.;Long Term Goal: Adherence to prescribed nutrition plan.           Nutrition Assessments:  Nutrition Assessments - 03/30/20 1111      MEDFICTS Scores   Pre Score 15           MEDIFICTS Score Key:          ?70 Need to make dietary changes          40-70 Heart Healthy Diet         ? 40 Therapeutic Level Cholesterol Diet  Nutrition Goals Re-Evaluation:  Nutrition Goals Re-Evaluation    Charles Mix Name 05/11/20 2536 06/03/20 0934           Goals   Nutrition Goal ST: get in touch with oncology RDN, continue with current changes LT: maintain halth and weight through chemo Now sees oncology dietitian.      Comment Pt reports eating and getting enough protein, no loss of appetite or taste changes at this time. She has still not talked to an oncology dietitian, encouraged her to keep trying. She is seeing a dietitian on a regular basis 2x/week 1 hour. Will check in for needs, but oncology RD will follow.      Expected Outcome ST: get in touch with oncology RDN, continue with current changes LT: maintain halth and weight through chemo Now sees oncology dietitian.             Nutrition Goals Discharge (Final Nutrition Goals Re-Evaluation):  Nutrition Goals Re-Evaluation - 06/03/20 0934      Goals   Nutrition Goal Now sees oncology dietitian.    Comment She is  seeing a dietitian on a regular basis 2x/week 1 hour. Will check in for needs, but oncology RD will follow.     Expected Outcome Now sees oncology dietitian.           Psychosocial: Target Goals: Acknowledge presence or absence of significant depression and/or stress, maximize coping skills, provide positive support system. Participant is able to verbalize types and ability to use techniques and skills needed for reducing stress and depression.   Education: Depression - Provides group verbal and written instruction on the correlation between heart/lung disease and depressed mood, treatment options, and the stigmas associated with seeking treatment.   Pulmonary Rehab from 06/03/2020 in [facility] Cardiac and Pulmonary Rehab  Date 04/15/20  Educator SB  Instruction Review Code 1- [employer] Understanding      Education: Sleep Hygiene -Provides group verbal and written instruction about how sleep can affect your health.  Define sleep hygiene, discuss sleep cycles and impact of sleep habits. Review good sleep hygiene tips.    Pulmonary Rehab from 07/19/2018 in [facility] Cardiac and Pulmonary Rehab  Date 05/15/18  Educator [facility]  Instruction Review Code 1- Verbalizes Understanding      Education: Stress and Anxiety: - Provides group verbal and written instruction about the health risks of elevated stress and causes of high stress.  Discuss the correlation between heart/lung disease and anxiety and treatment options. Review healthy ways to manage with stress and anxiety.   Pulmonary Rehab from 06/03/2020 in [facility] Cardiac and Pulmonary Rehab  Date 04/15/20  Educator SB  Instruction Review Code 1- Verbalizes Understanding      Initial Review & Psychosocial Screening:  Initial Psych Review & Screening - 03/11/20 1040      Initial Review   Current issues with Current Stress Concerns    Source of Stress Concerns Chronic Illness    Comments Stepfanie has recently been diagnosed with Lung Cancer and is a big factor in her stress. She feels like she is handling her diagnosis well.      Family Dynamics   Good Support  System? Yes    Comments Marisabel can talk to her sister in [place] for support.      Barriers   Psychosocial barriers to participate in program The patient should benefit from training in stress management and relaxation.      Screening Interventions   Interventions To provide support and resources with identified psychosocial needs;Encouraged to exercise;Provide feedback about the scores to participant    Expected Outcomes Short Term goal: Utilizing psychosocial counselor, staff and physician to assist with identification of specific Stressors or current issues interfering with healing process. Setting desired goal for each stressor or current issue identified.;Long Term Goal: Stressors or current issues are controlled or eliminated.;Short Term goal: Identification and review with participant of any Quality of Life or Depression concerns found by scoring the questionnaire.;Long Term goal: The participant improves quality of Life and PHQ9 Scores as seen by post scores and/or verbalization of changes           Quality of Life Scores:  Scores of 19 and below usually indicate a poorer quality of life in these areas.  A difference of  2-3 points is a clinically meaningful difference.  A difference of 2-3 points in the total score of the Quality of Life Index has been associated with significant improvement in overall quality of life, self-image, physical symptoms, and general health in studies assessing change in quality of life.  PHQ-9: Recent Review Flowsheet Data  Depression screen [facility] 2/9 04/06/2020 03/18/2020 12/02/2018 10/10/2018 08/12/2018   Decreased Interest 2 1 0 0 0   Down, Depressed, Hopeless 3 1 0 0 0   PHQ - 2 Score 5 2 0 0 0   Altered sleeping 3 0 - - -   Tired, decreased energy 3 2 - - -   Change in appetite 3 2 - - -   Feeling bad or failure about yourself  0 0 - - -   Trouble concentrating 1 0 - - -   Moving slowly or fidgety/restless 0 0 - - -   Suicidal thoughts 0 0 - - -    PHQ-9 Score 15 6 - - -   Difficult doing work/chores Somewhat difficult Somewhat difficult - - -     Interpretation of Total Score  Total Score Depression Severity:  1-4 = Minimal depression, 5-9 = Mild depression, 10-14 = Moderate depression, 15-19 = Moderately severe depression, 20-27 = Severe depression   Psychosocial Evaluation and Intervention:  Psychosocial Evaluation - 03/11/20 1043      Psychosocial Evaluation & Interventions   Interventions Encouraged to exercise with the program and follow exercise prescription    Comments Adea has recently been diagnosed with Lung Cancer and is a big factor in her stress. She feels like she is handling her diagnosis well.    Expected Outcomes Short: Exercise regularly to support mental health and notify staff of any changes. Long: Maintain mental health and well being through teaching of rehab or prescribed medications independently.    Continue Psychosocial Services  Follow up required by staff           Psychosocial Re-Evaluation:  Psychosocial Re-Evaluation    Brillion Name 04/06/20 [phone] 05/11/20 0932 06/03/20 2409         Psychosocial Re-Evaluation   Current issues with Current Stress Concerns Current Stress Concerns Current Stress Concerns     Comments Her brain CT scan was clear, but now awaiting on results for lungs; her appt is on Wednesday.  Her PHQ has gotten worse over the last month with her cancer and we talked about mentioning this to doctor to titrate meds.  She is sleeping okay, but her appeptitie is wanning as is her energy levels. Taima still finds it hard to deal with everything.  Her Dr is supposed to hook her up with a Social worker.  She is still sleeping ok.  She does feel exercise helps with her stamina and stress level. Isis still finds it hard to deal with everything.  She is hooked up with the counselor and will go next week.  She is still sleeping ok.  She does feel exercise helps with her stamina and stress level, she  also plays with her cats to relieve stress.     Expected Outcomes Short: Get results and talk to doctor.  Long; Conitnue to focus on positive and exercise for mental boost. Short: meet with counselor Long: continue to exercise to help manage stress Short: meet with counselor Long: continue to exercise to help manage stress     Interventions Encouraged to attend Pulmonary Rehabilitation for the exercise -- Encouraged to attend Pulmonary Rehabilitation for the exercise     Continue Psychosocial Services  Follow up required by staff -- Follow up required by staff     Comments -- -- Lung cancer and infusions continue to give her stress.       Initial Review   Source of Stress Concerns -- -- Chronic  Illness            Psychosocial Discharge (Final Psychosocial Re-Evaluation):  Psychosocial Re-Evaluation - 06/03/20 0923      Psychosocial Re-Evaluation   Current issues with Current Stress Concerns    Comments Priscilla still finds it hard to deal with everything.  She is hooked up with the counselor and will go next week.  She is still sleeping ok.  She does feel exercise helps with her stamina and stress level, she also plays with her cats to relieve stress.    Expected Outcomes Short: meet with counselor Long: continue to exercise to help manage stress    Interventions Encouraged to attend Pulmonary Rehabilitation for the exercise    Continue Psychosocial Services  Follow up required by staff    Comments Lung cancer and infusions continue to give her stress.      Initial Review   Source of Stress Concerns Chronic Illness           Education: Education Goals: Education classes will be provided on a weekly basis, covering required topics. Participant will state understanding/return demonstration of topics presented.  Learning Barriers/Preferences:  Learning Barriers/Preferences - 03/11/20 1040      Learning Barriers/Preferences   Learning Barriers Sight    Learning Preferences None            General Pulmonary Education Topics:  Infection Prevention: - Provides verbal and written material to individual with discussion of infection control including proper hand washing and proper equipment cleaning during exercise session.   Pulmonary Rehab from 06/03/2020 in [facility] Cardiac and Pulmonary Rehab  Date 03/11/20  Educator [facility]  Instruction Review Code 1- Verbalizes Understanding      Falls Prevention: - Provides verbal and written material to individual with discussion of falls prevention and safety.   Pulmonary Rehab from 06/03/2020 in [facility] Cardiac and Pulmonary Rehab  Date 03/11/20  Educator [facility]  Instruction Review Code 1- Verbalizes Understanding      Chronic Lung Diseases: - Group verbal and written instruction to review updates, respiratory medications, advancements in procedures and treatments. Discuss use of supplemental oxygen including available portable oxygen systems, continuous and intermittent flow rates, concentrators, personal use and safety guidelines. Review proper use of inhaler and spacers. Provide informative websites for self-education.    Pulmonary Rehab from 07/19/2018 in [facility] Cardiac and Pulmonary Rehab  Date 05/24/18  Educator [facility]  Instruction Review Code 1- Verbalizes Understanding      Energy Conservation: - Provide group verbal and written instruction for methods to conserve energy, plan and organize activities. Instruct on pacing techniques, use of adaptive equipment and posture/positioning to relieve shortness of breath.   Pulmonary Rehab from 07/19/2018 in [facility] Cardiac and Pulmonary Rehab  Date 07/10/18  Educator [facility]  Instruction Review Code 1- Verbalizes Understanding      Triggers and Exacerbations: - Group verbal and written instruction to review types of environmental triggers and ways to prevent exacerbations. Discuss weather changes, air quality and the benefits of nasal washing. Review warning signs and symptoms to help prevent  infections. Discuss techniques for effective airway clearance, coughing, and vibrations.   Pulmonary Rehab from 07/19/2018 in [facility] Cardiac and Pulmonary Rehab  Date 06/12/18  Educator [facility]  Instruction Review Code 1- Verbalizes Understanding      AED/CPR: - Group verbal and written instruction with the use of models to demonstrate the basic use of the AED with the basic ABC's of resuscitation.   Pulmonary Rehab from 07/19/2018 in [facility] Cardiac  and Pulmonary Rehab  Date 07/19/18  Educator [facility]  Instruction Review Code 1- Actuary and Physiology of the Lungs: - Group verbal and written instruction with the use of models to provide basic lung anatomy and physiology related to function, structure and complications of lung disease.   Pulmonary Rehab from 07/19/2018 in [facility] Cardiac and Pulmonary Rehab  Date 05/08/18  Educator [facility]  Instruction Review Code 1- Verbalizes Understanding      Anatomy & Physiology of the Heart: - Group verbal and written instruction and models provide basic cardiac anatomy and physiology, with the coronary electrical and arterial systems. Review of Valvular disease and Heart Failure   Pulmonary Rehab from 07/19/2018 in [facility] Cardiac and Pulmonary Rehab  Date 06/21/18  Educator [facility]  Instruction Review Code 1- Verbalizes Understanding      Cardiac Medications: - Group verbal and written instruction to review commonly prescribed medications for heart disease. Reviews the medication, class of the drug, and side effects.   Pulmonary Rehab from 06/03/2020 in [facility] Cardiac and Pulmonary Rehab  Date 04/08/20  Educator SB  Instruction Review Code 1- Verbalizes Understanding      Other: -Provides group and verbal instruction on various topics (see comments)   Knowledge Questionnaire Score:  Knowledge Questionnaire Score - 03/18/20 1055      Knowledge Questionnaire Score   Pre Score 16/18 Education: O2 Safety            Core  Components/Risk Factors/Patient Goals at Admission:  Personal Goals and Risk Factors at Admission - 03/18/20 1055      Core Components/Risk Factors/Patient Goals on Admission    Weight Management Yes;Weight Loss;Obesity    Intervention Weight Management: Develop a combined nutrition and exercise program designed to reach desired caloric intake, while maintaining appropriate intake of nutrient and fiber, sodium and fats, and appropriate energy expenditure required for the weight goal.;Weight Management: Provide education and appropriate resources to help participant work on and attain dietary goals.;Weight Management/Obesity: Establish reasonable short term and long term weight goals.;Obesity: Provide education and appropriate resources to help participant work on and attain dietary goals.    Admit Weight 203 lb 14.4 oz (92.5 kg)    Goal Weight: Short Term 198 lb (89.8 kg)    Goal Weight: Long Term 190 lb (86.2 kg)    Expected Outcomes Short Term: Continue to assess and modify interventions until short term weight is achieved;Long Term: Adherence to nutrition and physical activity/exercise program aimed toward attainment of established weight goal;Weight Loss: Understanding of general recommendations for a balanced deficit meal plan, which promotes 1-2 lb weight loss per week and includes a negative energy balance of [phone] kcal/d;Understanding recommendations for meals to include 15-35% energy as protein, 25-35% energy from fat, 35-60% energy from carbohydrates, less than $RemoveB'200mg'zldjnWyi$  of dietary cholesterol, 20-35 gm of total fiber daily;Understanding of distribution of calorie intake throughout the day with the consumption of 4-5 meals/snacks    Improve shortness of breath with ADL's Yes    Intervention Provide education, individualized exercise plan and daily activity instruction to help decrease symptoms of SOB with activities of daily living.    Expected Outcomes Short Term: Improve cardiorespiratory  fitness to achieve a reduction of symptoms when performing ADLs;Long Term: Be able to perform more ADLs without symptoms or delay the onset of symptoms    Diabetes Yes    Intervention Provide education about signs/symptoms and action to take for hypo/hyperglycemia.;Provide education about proper nutrition, including hydration,  and aerobic/resistive exercise prescription along with prescribed medications to achieve blood glucose in normal ranges: Fasting glucose 65-99 mg/dL    Expected Outcomes Long Term: Attainment of HbA1C < 7%.;Short Term: Participant verbalizes understanding of the signs/symptoms and immediate care of hyper/hypoglycemia, proper foot care and importance of medication, aerobic/resistive exercise and nutrition plan for blood glucose control.    Hypertension Yes    Intervention Provide education on lifestyle modifcations including regular physical activity/exercise, weight management, moderate sodium restriction and increased consumption of fresh fruit, vegetables, and low fat dairy, alcohol moderation, and smoking cessation.;Monitor prescription use compliance.    Expected Outcomes Short Term: Continued assessment and intervention until BP is < 140/48mm HG in hypertensive participants. < 130/43mm HG in hypertensive participants with diabetes, heart failure or chronic kidney disease.;Long Term: Maintenance of blood pressure at goal levels.    Lipids Yes    Intervention Provide education and support for participant on nutrition & aerobic/resistive exercise along with prescribed medications to achieve LDL '70mg'$ , HDL >$Remo'40mg'SRhfe$ .    Expected Outcomes Short Term: Participant states understanding of desired cholesterol values and is compliant with medications prescribed. Participant is following exercise prescription and nutrition guidelines.;Long Term: Cholesterol controlled with medications as prescribed, with individualized exercise RX and with personalized nutrition plan. Value goals: LDL < $Rem'70mg'NVQi$ ,  HDL > 40 mg.           Education:Diabetes - Individual verbal and written instruction to review signs/symptoms of diabetes, desired ranges of glucose level fasting, after meals and with exercise. Acknowledge that pre and post exercise glucose checks will be done for 3 sessions at entry of program.   Pulmonary Rehab from 06/03/2020 in [facility] Cardiac and Pulmonary Rehab  Date 03/11/20  Educator [facility]  Instruction Review Code 1- Verbalizes Understanding      Education: Know Your Numbers and Risk Factors: -Group verbal and written instruction about important numbers in your health.  Discussion of what are risk factors and how they play a role in the disease process.  Review of Cholesterol, Blood Pressure, Diabetes, and BMI and the role they play in your overall health.   Pulmonary Rehab from 06/03/2020 in [facility] Cardiac and Pulmonary Rehab  Date 06/03/20  Educator SB  Instruction Review Code 1- Verbalizes Understanding      Core Components/Risk Factors/Patient Goals Review:   Goals and Risk Factor Review    Row Name 04/06/20 0940 05/11/20 0929 06/03/20 0935         Core Components/Risk Factors/Patient Goals Review   Personal Goals Review Weight Management/Obesity;Improve shortness of breath with ADL's;Diabetes;Lipids Weight Management/Obesity;Improve shortness of breath with ADL's;Diabetes;Lipids Weight Management/Obesity;Improve shortness of breath with ADL's;Diabetes;Lipids     Review Chantal is doing well in rehab.  Her weight has been fluctuating and she is frustrated that she can't do all that she wants to do. Her blood sugars have been doing well and so have her pressures. Liala is still monitoring BP and BG at home.  She had first infusion 3 weeks ago and has next one tomorrow.  She may have had an allergic reaction to the treatment and she will follow up with Dr about that. Tasneem is still monitoring BG at home. Bg has been normal and her appetite is coming back. Stopped taking BP at home,  encouraged to do so at least 1x/day. She continues with her infusions which has been wiping her out, she now has a port.  She has not been seeing anymore allergic reaction symptoms. She is stressed as well due  to new blood clot in her neck, started Eliquis to help.     Expected Outcomes Short: Continue to work on her weight Long: Continue to manage lung disease. Short:  continue to monitor BG and BP Long:  manage lung disease and risk factors Short:  continue to monitor BG and BP Long:  manage lung disease and risk factors            Core Components/Risk Factors/Patient Goals at Discharge (Final Review):   Goals and Risk Factor Review - 06/03/20 0935      Core Components/Risk Factors/Patient Goals Review   Personal Goals Review Weight Management/Obesity;Improve shortness of breath with ADL's;Diabetes;Lipids    Review Solei is still monitoring BG at home. Bg has been normal and her appetite is coming back. Stopped taking BP at home, encouraged to do so at least 1x/day. She continues with her infusions which has been wiping her out, she now has a port.  She has not been seeing anymore allergic reaction symptoms. She is stressed as well due to new blood clot in her neck, started Eliquis to help.    Expected Outcomes Short:  continue to monitor BG and BP Long:  manage lung disease and risk factors           ITP Comments:  ITP Comments    Row Name 03/11/20 1045 03/18/20 1023 03/24/20 0634 04/20/20 1521 04/21/20 0543   ITP Comments Virtual Visit completed. Patient informed on EP and RD appointment and 6 Minute walk test. Patient also informed of patient health questionnaires on My Chart. Patient Verbalizes understanding. Visit diagnosis can be found in [facility]. Completed 6MWT and gym orientation. Initial ITP created and sent for review to Dr. Emily Filbert, Medical Director. 30 Day review completed. Medical Director ITP review done, changes made as directed, and signed approval by Medical  Director. Tyler called out today, she started her cancer treatment infusions yesterday. 30 Day review completed. Medical Director ITP review done, changes made as directed, and signed approval by Medical Director.   [place] Name 04/22/20 0857 05/19/20 1157 06/10/20 0922 06/15/20 1511 06/16/20 0514   ITP Comments Pt called to report that she was still feeling fatigued but better. She had spent the day at [facility] yesterday which did not help.  She is going to come to her lymphedema appt today and try for rehab next week. 30 day review completed. ITP sent to Dr. Emily Filbert, Medical Director of Cardiac and Pulmonary Rehab. Continue with ITP unless changes are made by physician. Arine has ben admitted to [facility] with port infection. Remains in hospital as of 9/30 Still admitted to ICU at [facility]. 30 Day review completed. Medical Director ITP review done, changes made as directed, and signed approval by Medical Director.          Comments:

## 2020-06-17 ENCOUNTER — Ambulatory Visit: Payer: Medicare Other | Admitting: Occupational Therapy

## 2020-06-20 NOTE — Progress Notes (Signed)
No show

## 2020-06-21 ENCOUNTER — Encounter: Payer: Medicare Other | Admitting: Pain Medicine

## 2020-06-21 DIAGNOSIS — G893 Neoplasm related pain (acute) (chronic): Secondary | ICD-10-CM | POA: Insufficient documentation

## 2020-06-22 ENCOUNTER — Encounter: Payer: Self-pay | Admitting: *Deleted

## 2020-06-22 DIAGNOSIS — R0602 Shortness of breath: Secondary | ICD-10-CM

## 2020-06-24 ENCOUNTER — Ambulatory Visit: Payer: Medicare Other | Admitting: Occupational Therapy

## 2020-06-28 ENCOUNTER — Encounter: Payer: Self-pay | Admitting: *Deleted

## 2020-06-28 DIAGNOSIS — R0602 Shortness of breath: Secondary | ICD-10-CM

## 2020-06-28 NOTE — Progress Notes (Signed)
Cardiac Individual Treatment Plan  Patient Details  Name: Susan Callahan MRN: [id number] Date of Birth: [birth year] Referring Provider:     Pulmonary Rehab from 03/18/2020 in [facility] Cardiac and Pulmonary Rehab  Referring Provider [name] MD      Initial Encounter Date:    Pulmonary Rehab from 03/18/2020 in [facility] Cardiac and Pulmonary Rehab  Date 03/18/20      Visit Diagnosis: Shortness of breath  Patient's Home Medications on Admission:  Current Outpatient Medications:  .  acetaZOLAMIDE (DIAMOX) 250 MG tablet, Take 250 mg by mouth daily. , Disp: , Rfl:  .  allopurinol (ZYLOPRIM) 300 MG tablet, Take 300 mg by mouth daily. , Disp: , Rfl:  .  amitriptyline (ELAVIL) 10 MG tablet, Take 10 mg by mouth at bedtime. , Disp: , Rfl:  .  atorvastatin (LIPITOR) 10 MG tablet, Take 10 mg by mouth daily., Disp: , Rfl:  .  b complex vitamins capsule, Take 1 capsule by mouth daily. , Disp: , Rfl:  .  Calcium Carbonate-Vitamin D 600-400 MG-UNIT tablet, Take 1 tablet by mouth daily. , Disp: , Rfl:  .  Cholecalciferol (VITAMIN D) 125 MCG (5000 UT) CAPS, Take 5,000 Units by mouth daily., Disp: , Rfl:  .  EPINEPHrine 0.3 mg/0.3 mL IJ SOAJ injection, Inject 0.3 mLs into the muscle as needed., Disp: , Rfl:  .  Exemestane (AROMASIN PO), Take 25 mg by mouth daily., Disp: , Rfl:  .  furosemide (LASIX) 40 MG tablet, Take 40 mg by mouth 2 (two) times daily.  (Patient not taking: Reported on 03/11/2020), Disp: , Rfl:  .  gabapentin (NEURONTIN) 600 MG tablet, Take 600 mg by mouth 2 (two) times daily. , Disp: , Rfl:  .  magnesium oxide (MAG-OX) 400 MG tablet, Take by mouth., Disp: , Rfl:  .  metFORMIN (GLUCOPHAGE-XR) 500 MG 24 hr tablet, Take 500 mg by mouth 2 (two) times daily. , Disp: , Rfl:  .  Multiple Vitamin (MULTIVITAMIN) capsule, Take 1 capsule by mouth daily. , Disp: , Rfl:  .  mupirocin ointment (BACTROBAN) 2 %, Apply two times a day for 7 days., Disp: 22 g, Rfl: 0 .  traMADol (ULTRAM) 50 MG tablet,  Take 1 tablet (50 mg total) by mouth every 6 (six) hours as needed for severe pain., Disp: 120 tablet, Rfl: 2 .  verapamil (CALAN-SR) 240 MG CR tablet, Take 240 mg by mouth daily. , Disp: , Rfl:   Past Medical History: Past Medical History:  Diagnosis Date  . COPD (chronic obstructive pulmonary disease) ([place])   . Depression 1974  . Diabetes mellitus without complication ([place])   . Hypertension   . Migraine   . 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 Packs/day: 3.00  . Years: 27.00  . Pack years: 81.00  . Types: Cigarettes  . Quit date: 03/15/1991  . Years since quitting: 29.3  Smokeless Tobacco Never Used    Labs: Recent Review Flowsheet Data   There is no flowsheet data to display.      Exercise Target Goals: Exercise Program Goal: Individual exercise prescription set using results from initial 6 min walk test and THRR while considering  patient's activity barriers and safety.   Exercise Prescription Goal: Initial exercise prescription builds to 30-45 minutes a day of aerobic activity, 2-3 days per week.  Home exercise guidelines will be given to patient during program as part  of exercise prescription that the participant will acknowledge.   Education: Aerobic Exercise & Resistance Training: - Gives group verbal and written instruction on the various components of exercise. Focuses on aerobic and resistive training programs and the benefits of this training and how to safely progress through these programs..   Pulmonary Rehab from 06/03/2020 in [facility] Cardiac and Pulmonary Rehab  Date 04/29/20  Educator [facility]  Instruction Review Code 1- Verbalizes Understanding      Education: Exercise & Equipment Safety: - Individual verbal instruction and demonstration of equipment use and safety with use of the equipment.   Pulmonary Rehab from 06/03/2020 in [facility] Cardiac and Pulmonary Rehab  Date 03/11/20  Educator  [facility]  Instruction Review Code 1- Verbalizes Understanding      Education: Exercise Physiology & General Exercise Guidelines: - Group verbal and written instruction with models to review the exercise physiology of the cardiovascular system and associated critical values. Provides general exercise guidelines with specific guidelines to those with heart or lung disease.    Pulmonary Rehab from 07/19/2018 in [facility] Cardiac and Pulmonary Rehab  Date 06/05/18  Educator [facility]  Instruction Review Code 1- Verbalizes Understanding      Education: Flexibility, Balance, Mind/Body Relaxation: Provides group verbal/written instruction on the benefits of flexibility and balance training, including mind/body exercise modes such as yoga, pilates and tai chi.  Demonstration and skill practice provided.   Pulmonary Rehab from 07/19/2018 in [facility] Cardiac and Pulmonary Rehab  Date 07/03/18  Educator [facility]  Instruction Review Code 1- Verbalizes Understanding      Activity Barriers & Risk Stratification:  Activity Barriers & Cardiac Risk Stratification - 03/18/20 1036      Activity Barriers & Cardiac Risk Stratification   Activity Barriers Balance Concerns;Muscular Weakness;Deconditioning;Shortness of Breath;Back Problems           6 Minute Walk:  6 Minute Walk    Row Name 03/18/20 1026         6 Minute Walk   Phase Initial     Distance 700 feet     Walk Time 5.05 minutes     # of Rest Breaks 0     MPH 1.57     METS 1.64     RPE 15     Perceived Dyspnea  4     VO2 Peak 5.75     Symptoms Yes (comment)     Comments SOB     Resting HR 66 bpm     Resting BP 124/64     Resting Oxygen Saturation  94 %     Exercise Oxygen Saturation  during 6 min walk 83 %     Max Ex. HR 96 bpm     Max Ex. BP 154/70     2 Minute Post BP 146/74       Interval HR   1 Minute HR 81     2 Minute HR 88     3 Minute HR 94     4 Minute HR 88     5 Minute HR 88     6 Minute HR 96     2 Minute Post HR 73     Interval  Heart Rate? Yes       Interval Oxygen   Interval Oxygen? Yes     Baseline Oxygen Saturation % 94 %     1 Minute Oxygen Saturation % 91 %     1 Minute Liters of Oxygen 3 L  2 Minute Oxygen Saturation % 85 %     2 Minute Liters of Oxygen 3 L     3 Minute Oxygen Saturation % 84 %  at 3:27 83%     3 Minute Liters of Oxygen 3 L     4 Minute Oxygen Saturation % 86 %     4 Minute Liters of Oxygen 3 L     5 Minute Oxygen Saturation % 89 %     5 Minute Liters of Oxygen 3 L     6 Minute Oxygen Saturation % 87 %     6 Minute Liters of Oxygen 3 L     2 Minute Post Oxygen Saturation % 95 %     2 Minute Post Liters of Oxygen 3 L            Oxygen Initial Assessment:  Oxygen Initial Assessment - 03/11/20 1037      Home Oxygen   Home Oxygen Device Home Concentrator;E-Tanks    Sleep Oxygen Prescription Continuous    Liters per minute 1    Home Exercise Oxygen Prescription Continuous    Liters per minute 2    Home Resting Oxygen Prescription Continuous    Liters per minute 1    Compliance with Home Oxygen Use Yes      Initial 6 min Walk   Oxygen Used Continuous    Liters per minute 2      Program Oxygen Prescription   Program Oxygen Prescription Continuous    Liters per minute 2      Intervention   Short Term Goals To learn and exhibit compliance with exercise, home and travel O2 prescription;To learn and understand importance of monitoring SPO2 with pulse oximeter and demonstrate accurate use of the pulse oximeter.;To learn and understand importance of maintaining oxygen saturations>88%;To learn and demonstrate proper pursed lip breathing techniques or other breathing techniques.;To learn and demonstrate proper use of respiratory medications    Long  Term Goals Exhibits compliance with exercise, home and travel O2 prescription;Maintenance of O2 saturations>88%;Verbalizes importance of monitoring SPO2 with pulse oximeter and return demonstration;Exhibits proper breathing techniques,  such as pursed lip breathing or other method taught during program session;Compliance with respiratory medication;Demonstrates proper use of MDI's           Oxygen Re-Evaluation:  Oxygen Re-Evaluation    Row Name 04/06/20 0935 05/11/20 0934 06/03/20 0930         Program Oxygen Prescription   Program Oxygen Prescription Continuous Continuous Continuous     Liters per minute _0 Home Oxygen   Home Oxygen Device Home Concentrator;E-Tanks Home Concentrator;E-Tanks Home Concentrator     Sleep Oxygen Prescription Continuous Continuous Continuous     Liters per minute _1 Home Exercise Oxygen Prescription Continuous Continuous Continuous     Liters per minute _2 Home Resting Oxygen Prescription Continuous Continuous Continuous     Liters per minute _3 Compliance with Home Oxygen Use Yes Yes Yes       Goals/Expected Outcomes   Short Term Goals To learn and exhibit compliance with exercise, home and travel O2 prescription;To learn and understand importance of monitoring SPO2 with pulse oximeter and demonstrate accurate use of the pulse oximeter.;To learn and understand importance of maintaining oxygen saturations>88%;To learn and demonstrate proper pursed lip breathing techniques or other breathing techniques.;To learn and demonstrate proper  use of respiratory medications To learn and exhibit compliance with exercise, home and travel O2 prescription;To learn and understand importance of monitoring SPO2 with pulse oximeter and demonstrate accurate use of the pulse oximeter.;To learn and understand importance of maintaining oxygen saturations>88%;To learn and demonstrate proper pursed lip breathing techniques or other breathing techniques.;To learn and demonstrate proper use of respiratory medications To learn and exhibit compliance with exercise, home and travel O2 prescription;To learn and understand importance of monitoring SPO2 with pulse oximeter and demonstrate  accurate use of the pulse oximeter.;To learn and understand importance of maintaining oxygen saturations>88%;To learn and demonstrate proper pursed lip breathing techniques or other breathing techniques.;To learn and demonstrate proper use of respiratory medications     Long  Term Goals Exhibits compliance with exercise, home and travel O2 prescription;Maintenance of O2 saturations>88%;Verbalizes importance of monitoring SPO2 with pulse oximeter and return demonstration;Exhibits proper breathing techniques, such as pursed lip breathing or other method taught during program session;Compliance with respiratory medication;Demonstrates proper use of MDI's Exhibits compliance with exercise, home and travel O2 prescription;Maintenance of O2 saturations>88%;Verbalizes importance of monitoring SPO2 with pulse oximeter and return demonstration;Exhibits proper breathing techniques, such as pursed lip breathing or other method taught during program session;Compliance with respiratory medication;Demonstrates proper use of MDI's Exhibits compliance with exercise, home and travel O2 prescription;Maintenance of O2 saturations>88%;Verbalizes importance of monitoring SPO2 with pulse oximeter and return demonstration;Exhibits proper breathing techniques, such as pursed lip breathing or other method taught during program session;Compliance with respiratory medication;Demonstrates proper use of MDI's     Comments Susan Callahan is really struggling with her breathing and desaturating when she walks.  We talked about PLB and talking to the doctor to increase her liter flow. Susan Callahan uses 2L at home most of the time as she is fairly active and doesnt sit for too long.  She uses PLB when needed.  She goes up to 3L when walking if needed as recommended by Dr. Vaughan Basta uses 2L at home most of the time she is fatigued with infusions.  She uses PLB when needed.  She still goes up to 3L when walking if needed as recommended by Dr.     Bertram Callahan  Outcomes Short: Talk to doctor to see if she can increase her liter flow.  Long; Continue to work on PLB. Short: continue to monitor oxygen when at home Long: continue to work on PLB --            Oxygen Discharge (Final Oxygen Re-Evaluation):  Oxygen Re-Evaluation - 06/03/20 0930      Program Oxygen Prescription   Program Oxygen Prescription Continuous    Liters per minute 2      Home Oxygen   Home Oxygen Device Home Concentrator    Sleep Oxygen Prescription Continuous    Liters per minute 2    Home Exercise Oxygen Prescription Continuous    Liters per minute 2    Home Resting Oxygen Prescription Continuous    Liters per minute 2    Compliance with Home Oxygen Use Yes      Goals/Expected Outcomes   Short Term Goals To learn and exhibit compliance with exercise, home and travel O2 prescription;To learn and understand importance of monitoring SPO2 with pulse oximeter and demonstrate accurate use of the pulse oximeter.;To learn and understand importance of maintaining oxygen saturations>88%;To learn and demonstrate proper pursed lip breathing techniques or other breathing techniques.;To learn and demonstrate proper use of respiratory medications    Long  Term Goals Exhibits compliance with  exercise, home and travel O2 prescription;Maintenance of O2 saturations>88%;Verbalizes importance of monitoring SPO2 with pulse oximeter and return demonstration;Exhibits proper breathing techniques, such as pursed lip breathing or other method taught during program session;Compliance with respiratory medication;Demonstrates proper use of MDI's    Comments Susan Callahan uses 2L at home most of the time she is fatigued with infusions.  She uses PLB when needed.  She still goes up to 3L when walking if needed as recommended by Dr.           Initial Exercise Prescription:  Initial Exercise Prescription - 03/18/20 1000      Date of Initial Exercise RX and Referring Provider   Date 03/18/20    Referring  Provider [name] MD      Oxygen   Oxygen Continuous    Liters 3-4      Treadmill   MPH 1.5    Grade 0    Minutes 15    METs 2.15      NuStep   Level 1    SPM 80    Minutes 15    METs 1.6      REL-XR   Level 1    Speed 50    Minutes 15    METs 1.6      Prescription Details   Frequency (times per week) 2    Duration Progress to 30 minutes of continuous aerobic without signs/symptoms of physical distress      Intensity   THRR 40-80% of Max Heartrate 100-134    Ratings of Perceived Exertion 11-13    Perceived Dyspnea 0-4      Progression   Progression Continue to progress workloads to maintain intensity without signs/symptoms of physical distress.      Resistance Training   Training Prescription Yes    Weight 3 lb    Reps 10-15           Perform Capillary Blood Glucose checks as needed.  Exercise Prescription Changes:  Exercise Prescription Changes    Row Name 03/18/20 1000 04/06/20 1300 04/20/20 1500 05/04/20 1500 05/18/20 1500     Response to Exercise   Blood Pressure (Admit) 124/64 132/62 124/60 134/70 134/72   Blood Pressure (Exercise) 154/70 138/62 138/64 130/70 130/70   Blood Pressure (Exit) 136/70 128/62 150/90 128/64 122/62   Heart Rate (Admit) 66 bpm 72 bpm 82 bpm 80 bpm 84 bpm   Heart Rate (Exercise) 96 bpm 87 bpm 92 bpm 98 bpm 94 bpm   Heart Rate (Exit) 67 bpm 74 bpm 79 bpm 94 bpm 82 bpm   Oxygen Saturation (Admit) 94 % 92 % 94 % 94 % 89 %   Oxygen Saturation (Exercise) 83 % 86 % 90 % 90 % 88 %   Oxygen Saturation (Exit) 94 % 92 % 94 % 98 % 91 %   Rating of Perceived Exertion (Exercise) _0 Perceived Dyspnea (Exercise) _1 Symptoms SOB none none SOB SOB, fatigue   Comments walk test results -- -- -- --   Duration -- Progress to 30 minutes of  aerobic without signs/symptoms of physical distress Continue with 30 min of aerobic exercise without signs/symptoms of physical distress. Continue with 30 min of aerobic exercise  without signs/symptoms of physical distress. Continue with 30 min of aerobic exercise without signs/symptoms of physical distress.   Intensity -- THRR unchanged THRR unchanged THRR unchanged THRR unchanged     Progression   Progression -- --  Continue to progress workloads to maintain intensity without signs/symptoms of physical distress. Continue to progress workloads to maintain intensity without signs/symptoms of physical distress. Continue to progress workloads to maintain intensity without signs/symptoms of physical distress.   Average METs -- -- 2.18 2 2.6     Resistance Training   Training Prescription -- Yes Yes Yes Yes   Weight -- 3 lb 3 lb 3 lb 3 lb   Reps -- 10-15 10-15 10-15 10-15     Interval Training   Interval Training -- -- No No No     Oxygen   Oxygen -- Continuous Continuous Continuous Continuous   Liters -- 3-4 3-4 3-4 2-3     Treadmill   MPH -- -- 1.5 1.5 --   Grade -- -- 0 0 --   Minutes -- -- 15 15 --   METs -- -- 2.15 2.15 --     Recumbant Bike   Level -- 1 1 -- 1   RPM -- 60 -- -- --   Watts -- -- -- -- 18   Minutes -- 15 15 -- 15   METs -- 2.85 -- -- 2.6     NuStep   Level -- 2 4 -- 4   SPM -- 80 -- -- --   Minutes -- 15 15 -- 15   METs -- 2.3 2.2 -- 2.5     T5 Nustep   Level -- -- 1 1 --   SPM -- -- -- 80 --   Minutes -- -- 15 15 --   METs -- -- -- 1.9 --   Row Name 06/02/20 1300             Response to Exercise   Blood Pressure (Admit) 128/60       Blood Pressure (Exercise) 142/64       Blood Pressure (Exit) 124/62       Heart Rate (Admit) 79 bpm       Heart Rate (Exercise) 91 bpm       Heart Rate (Exit) 80 bpm       Oxygen Saturation (Admit) 95 %       Oxygen Saturation (Exercise) 91 %       Oxygen Saturation (Exit) 94 %       Rating of Perceived Exertion (Exercise) 13       Perceived Dyspnea (Exercise) 2       Symptoms SOB       Duration Continue with 30 min of aerobic exercise without signs/symptoms of physical distress.        Intensity THRR unchanged         Progression   Progression Continue to progress workloads to maintain intensity without signs/symptoms of physical distress.       Average METs 2.5         Resistance Training   Training Prescription Yes       Weight 3 lb       Reps 10-15         Interval Training   Interval Training No         Oxygen   Oxygen Continuous       Liters 3         Treadmill   MPH 1.5       Grade 0       Minutes 15       METs 2.15              Exercise Comments:  Exercise Comments    Row Name 06/10/20 7121           Exercise Comments Susan Callahan has ben admitted to [facility] with port infection. Remains in hospital as of 9/30              Exercise Goals and Review:  Exercise Goals    Row Name 03/18/20 1053             Exercise Goals   Increase Physical Activity Yes       Intervention Provide advice, education, support and counseling about physical activity/exercise needs.;Develop an individualized exercise prescription for aerobic and resistive training based on initial evaluation findings, risk stratification, comorbidities and participant's personal goals.       Expected Outcomes Short Term: Attend rehab on a regular basis to increase amount of physical activity.;Long Term: Add in home exercise to make exercise part of routine and to increase amount of physical activity.;Long Term: Exercising regularly at least 3-5 days a week.       Increase Strength and Stamina Yes       Intervention Provide advice, education, support and counseling about physical activity/exercise needs.;Develop an individualized exercise prescription for aerobic and resistive training based on initial evaluation findings, risk stratification, comorbidities and participant's personal goals.       Expected Outcomes Short Term: Increase workloads from initial exercise prescription for resistance, speed, and METs.;Short Term: Perform resistance training exercises routinely during  rehab and add in resistance training at home;Long Term: Improve cardiorespiratory fitness, muscular endurance and strength as measured by increased METs and functional capacity (6MWT)       Able to understand and use rate of perceived exertion (RPE) scale Yes       Intervention Provide education and explanation on how to use RPE scale       Expected Outcomes Short Term: Able to use RPE daily in rehab to express subjective intensity level;Long Term:  Able to use RPE to guide intensity level when exercising independently       Able to understand and use Dyspnea scale Yes       Intervention Provide education and explanation on how to use Dyspnea scale       Expected Outcomes Short Term: Able to use Dyspnea scale daily in rehab to express subjective sense of shortness of breath during exertion;Long Term: Able to use Dyspnea scale to guide intensity level when exercising independently       Knowledge and understanding of Target Heart Rate Range (THRR) Yes       Intervention Provide education and explanation of THRR including how the numbers were predicted and where they are located for reference       Expected Outcomes Short Term: Able to state/look up THRR;Short Term: Able to use daily as guideline for intensity in rehab;Long Term: Able to use THRR to govern intensity when exercising independently       Able to check pulse independently Yes       Intervention Provide education and demonstration on how to check pulse in carotid and radial arteries.;Review the importance of being able to check your own pulse for safety during independent exercise       Expected Outcomes Short Term: Able to explain why pulse checking is important during independent exercise;Long Term: Able to check pulse independently and accurately       Understanding of Exercise Prescription Yes       Intervention Provide education, explanation, and written materials on patient's individual exercise  prescription       Expected Outcomes  Short Term: Able to explain program exercise prescription;Long Term: Able to explain home exercise prescription to exercise independently              Exercise Goals Re-Evaluation :  Exercise Goals Re-Evaluation    Row Name 04/06/20 1339 04/20/20 1521 05/04/20 1546 05/11/20 0937 05/18/20 1544     Exercise Goal Re-Evaluation   Exercise Goals Review Increase Physical Activity;Increase Strength and Stamina Increase Physical Activity;Increase Strength and Stamina;Understanding of Exercise Prescription Increase Physical Activity;Increase Strength and Stamina;Understanding of Exercise Prescription Increase Physical Activity;Increase Strength and Stamina;Understanding of Exercise Prescription Increase Physical Activity;Increase Strength and Stamina;Understanding of Exercise Prescription   Comments Reviewed RPE and dyspnea scales, THR and program prescription with pt today.  Pt voiced understanding and was given a copy of goals to take home. Susan Callahan has been doing well in rehab.  She has now started her cancer treatments and infusions, first was yesterday.  She called out today, but we will take each day as she comes and work on maintaining progress. Susan Callahan does as much as she feels she can when she attends.  Staff will monitor progress. Susan Callahan has done some chair exercise with her cousin outside program sessions.  Class is on Zoom. Susan Callahan is doing well.  She has started her chemo infusions which will keep her out of class for a few days and then she returns.  She is up to level 4 on the NuStep.  We will continue to encourage good attendance and monitor her progress.   Expected Outcomes Short: Use RPE daily to regulate intensity. Long: Follow program prescription in THR. Short: Attend rehab regularly Long; Continue to maintain stamina. Short:  exercise consistently Long:  maintain stamina -- Short: continue to exercise even on days not in class Long: Continue to maintain and/or improve stamina   Row Name 06/02/20  1344 06/03/20 0923 06/15/20 1511         Exercise Goal Re-Evaluation   Exercise Goals Review Increase Physical Activity;Increase Strength and Stamina;Understanding of Exercise Prescription Increase Physical Activity;Increase Strength and Stamina;Understanding of Exercise Prescription --     Comments Susan Callahan says she feels good this week.  Her next infusion is next week.  Staff will monitor progress. Susan Callahan is struggling with her infusions and exercising at home. Staff will monitor progress. Discussed on her good days to try and do at least some light activity. Out since last review     Expected Outcomes Short: exercise within tolerance Long: maintain or improve stamina Short: exercise within tolerance Long: maintain or improve stamina --            Discharge Exercise Prescription (Final Exercise Prescription Changes):  Exercise Prescription Changes - 06/02/20 1300      Response to Exercise   Blood Pressure (Admit) 128/60    Blood Pressure (Exercise) 142/64    Blood Pressure (Exit) 124/62    Heart Rate (Admit) 79 bpm    Heart Rate (Exercise) 91 bpm    Heart Rate (Exit) 80 bpm    Oxygen Saturation (Admit) 95 %    Oxygen Saturation (Exercise) 91 %    Oxygen Saturation (Exit) 94 %    Rating of Perceived Exertion (Exercise) 13    Perceived Dyspnea (Exercise) 2    Symptoms SOB    Duration Continue with 30 min of aerobic exercise without signs/symptoms of physical distress.    Intensity THRR unchanged      Progression   Progression  Continue to progress workloads to maintain intensity without signs/symptoms of physical distress.    Average METs 2.5      Resistance Training   Training Prescription Yes    Weight 3 lb    Reps 10-15      Interval Training   Interval Training No      Oxygen   Oxygen Continuous    Liters 3      Treadmill   MPH 1.5    Grade 0    Minutes 15    METs 2.15           Nutrition:  Target Goals: Understanding of nutrition guidelines, daily intake of  sodium <1550m, cholesterol <2046m calories 30% from fat and 7% or less from saturated fats, daily to have 5 or more servings of fruits and vegetables.  Education: Controlling Sodium/Reading Food Labels -Group verbal and written material supporting the discussion of sodium use in heart healthy nutrition. Review and explanation with models, verbal and written materials for utilization of the food label.   [facility]  Date 05/29/18  Educator LB  Instruction Review Code 1- Verbalizes Understanding      Education: General Nutrition Guidelines/Fats and Fiber: -Group instruction provided by verbal, written material, models and posters to present the general guidelines for heart healthy nutrition. Gives an explanation and review of dietary fats and fiber.   [facility]  Date 05/20/20  Educator [place]  Instruction Review Code 1- Verbalizes Understanding      Biometrics:  Pre Biometrics - 03/18/20 1054      Pre Biometrics   Height _0  (1.651 m)    Weight 203 lb 14.4 oz (92.5 kg)    BMI (Calculated) 33.93    Single Leg Stand 5.88 seconds            Nutrition Therapy Plan and Nutrition Goals:  Nutrition Therapy & Goals - 04/13/20 0926      Nutrition Therapy   Diet High kcal, High Protein    Protein (specify units) 110-115g (COPD and current cancer treatment with recent 20 lb unintentional weight loss)    Fiber 25 grams    Whole Grain Foods 2 servings    Saturated Fats 12 max. grams    Fruits and Vegetables 5 servings/day    Sodium 1.5 grams      Personal Nutrition Goals   Nutrition Goal ST: eat small frequent meals, add fat to meals she is already eating, eat high quality protein at each meal LT: Maintain health and weight going through chemo    Comments Lung Cancer starting chemo 8/9 and immunotherapy. Skips breakfast - not hungry and nothing is appealing- may have some  cereal or some fruit and cool whip. Starts feeling like eaitng around 11am. Discussed high calorie and high protein nutrition therapy and discussed expectations for cancer treatment. Discussed T2DM nutrition therapy. Pt does not have an RD following her for cancer treatment - encouraged her to have an Rd to follow her - she is being treated with [facility] - [place].      Intervention Plan   Intervention Prescribe, educate and counsel regarding individualized specific dietary modifications aiming towards targeted core components such as weight, hypertension, lipid management, diabetes, heart failure and other comorbidities.;Nutrition handout(s) given to patient.    Expected Outcomes Short Term Goal: Understand basic principles of dietary content, such as calories,  fat, sodium, cholesterol and nutrients.;Short Term Goal: A plan has been developed with personal nutrition goals set during dietitian appointment.;Long Term Goal: Adherence to prescribed nutrition plan.           Nutrition Assessments:  Nutrition Assessments - 03/30/20 1111      MEDFICTS Scores   Pre Score 15           MEDIFICTS Score Key:          ?70 Need to make dietary changes          40-70 Heart Healthy Diet         ? 40 Therapeutic Level Cholesterol Diet  Nutrition Goals Re-Evaluation:  Nutrition Goals Re-Evaluation    [place] Name 05/11/20 2505 06/03/20 0934           Goals   Nutrition Goal ST: get in touch with oncology RDN, continue with current changes LT: maintain halth and weight through chemo Now sees oncology dietitian.      Comment Pt reports eating and getting enough protein, no loss of appetite or taste changes at this time. She has still not talked to an oncology dietitian, encouraged her to keep trying. She is seeing a dietitian on a regular basis 2x/week 1 hour. Will check in for needs, but oncology RD will follow.      Expected Outcome ST: get in touch with oncology RDN,  continue with current changes LT: maintain halth and weight through chemo Now sees oncology dietitian.             Nutrition Goals Discharge (Final Nutrition Goals Re-Evaluation):  Nutrition Goals Re-Evaluation - 06/03/20 0934      Goals   Nutrition Goal Now sees oncology dietitian.    Comment She is seeing a dietitian on a regular basis 2x/week 1 hour. Will check in for needs, but oncology RD will follow.    Expected Outcome Now sees oncology dietitian.           Psychosocial: Target Goals: Acknowledge presence or absence of significant depression and/or stress, maximize coping skills, provide positive support system. Participant is able to verbalize types and ability to use techniques and skills needed for reducing stress and depression.   Education: Depression - Provides group verbal and written instruction on the correlation between heart/lung disease and depressed mood, treatment options, and the stigmas associated with seeking treatment.   Pulmonary Rehab from 06/03/2020 in [facility] Cardiac and Pulmonary Rehab  Date 04/15/20  Educator SB  Instruction Review Code 1- [employer] Understanding      Education: Sleep Hygiene -Provides group verbal and written instruction about how sleep can affect your health.  Define sleep hygiene, discuss sleep cycles and impact of sleep habits. Review good sleep hygiene tips.    Pulmonary Rehab from 07/19/2018 in [facility] Cardiac and Pulmonary Rehab  Date 05/15/18  Educator [facility]  Instruction Review Code 1- Verbalizes Understanding       Education: Stress and Anxiety: - Provides group verbal and written instruction about the health risks of elevated stress and causes of high stress.  Discuss the correlation between heart/lung disease and anxiety and treatment options. Review healthy ways to manage with stress and anxiety.   Pulmonary Rehab from 06/03/2020 in [facility] Cardiac and Pulmonary Rehab  Date 04/15/20  Educator SB  Instruction Review Code 1-  Verbalizes Understanding       Initial Review & Psychosocial Screening:  Initial Psych Review & Screening - 03/11/20 1040      Initial Review  Current issues with Current Stress Concerns    Source of Stress Concerns Chronic Illness    Comments Susan Callahan has recently been diagnosed with Lung Cancer and is a big factor in her stress. She feels like she is handling her diagnosis well.      Family Dynamics   Good Support System? Yes    Comments Susan Callahan can talk to her sister in [place] for support.      Barriers   Psychosocial barriers to participate in program The patient should benefit from training in stress management and relaxation.      Screening Interventions   Interventions To provide support and resources with identified psychosocial needs;Encouraged to exercise;Provide feedback about the scores to participant    Expected Outcomes Short Term goal: Utilizing psychosocial counselor, staff and physician to assist with identification of specific Stressors or current issues interfering with healing process. Setting desired goal for each stressor or current issue identified.;Long Term Goal: Stressors or current issues are controlled or eliminated.;Short Term goal: Identification and review with participant of any Quality of Life or Depression concerns found by scoring the questionnaire.;Long Term goal: The participant improves quality of Life and PHQ9 Scores as seen by post scores and/or verbalization of changes           Quality of Life Scores:   Scores of 19 and below usually indicate a poorer quality of life in these areas.  A difference of  2-3 points is a clinically meaningful difference.  A difference of 2-3 points in the total score of the Quality of Life Index has been associated with significant improvement in overall quality of life, self-image, physical symptoms, and general health in studies assessing change in quality of life.  PHQ-9: Recent Review Flowsheet Data     Depression screen [facility] 2/9 04/06/2020 03/18/2020 12/02/2018 10/10/2018 08/12/2018   Decreased Interest 2 1 0 0 0   Down, Depressed, Hopeless 3 1 0 0 0   PHQ - 2 Score 5 2 0 0 0   Altered sleeping 3 0 - - -   Tired, decreased energy 3 2 - - -   Change in appetite 3 2 - - -   Feeling bad or failure about yourself  0 0 - - -   Trouble concentrating 1 0 - - -   Moving slowly or fidgety/restless 0 0 - - -   Suicidal thoughts 0 0 - - -   PHQ-9 Score 15 6 - - -   Difficult doing work/chores Somewhat difficult Somewhat difficult - - -     Interpretation of Total Score  Total Score Depression Severity:  1-4 = Minimal depression, 5-9 = Mild depression, 10-14 = Moderate depression, 15-19 = Moderately severe depression, 20-27 = Severe depression   Psychosocial Evaluation and Intervention:  Psychosocial Evaluation - 03/11/20 1043      Psychosocial Evaluation & Interventions   Interventions Encouraged to exercise with the program and follow exercise prescription    Comments Susan Callahan has recently been diagnosed with Lung Cancer and is a big factor in her stress. She feels like she is handling her diagnosis well.    Expected Outcomes Short: Exercise regularly to support mental health and notify staff of any changes. Long: Maintain mental health and well being through teaching of rehab or prescribed medications independently.    Continue Psychosocial Services  Follow up required by staff           Psychosocial Re-Evaluation:  Psychosocial Re-Evaluation    [place]  Name 04/06/20 6734 05/11/20 0932 06/03/20 1937         Psychosocial Re-Evaluation   Current issues with Current Stress Concerns Current Stress Concerns Current Stress Concerns     Comments Her brain CT scan was clear, but now awaiting on results for lungs; her appt is on Wednesday.  Her PHQ has gotten worse over the last month with her cancer and we talked about mentioning this to doctor to titrate meds.  She is sleeping okay, but her appeptitie is  wanning as is her energy levels. Susan Callahan still finds it hard to deal with everything.  Her Dr is supposed to hook her up with a Social worker.  She is still sleeping ok.  She does feel exercise helps with her stamina and stress level. Susan Callahan still finds it hard to deal with everything.  She is hooked up with the counselor and will go next week.  She is still sleeping ok.  She does feel exercise helps with her stamina and stress level, she also plays with her cats to relieve stress.     Expected Outcomes Short: Get results and talk to doctor.  Long; Conitnue to focus on positive and exercise for mental boost. Short: meet with counselor Long: continue to exercise to help manage stress Short: meet with counselor Long: continue to exercise to help manage stress     Interventions Encouraged to attend Pulmonary Rehabilitation for the exercise -- Encouraged to attend Pulmonary Rehabilitation for the exercise     Continue Psychosocial Services  Follow up required by staff -- Follow up required by staff     Comments -- -- Lung cancer and infusions continue to give her stress.       Initial Review   Source of Stress Concerns -- -- Chronic Illness            Psychosocial Discharge (Final Psychosocial Re-Evaluation):  Psychosocial Re-Evaluation - 06/03/20 0923      Psychosocial Re-Evaluation   Current issues with Current Stress Concerns    Comments Susan Callahan still finds it hard to deal with everything.  She is hooked up with the counselor and will go next week.  She is still sleeping ok.  She does feel exercise helps with her stamina and stress level, she also plays with her cats to relieve stress.    Expected Outcomes Short: meet with counselor Long: continue to exercise to help manage stress    Interventions Encouraged to attend Pulmonary Rehabilitation for the exercise    Continue Psychosocial Services  Follow up required by staff    Comments Lung cancer and infusions continue to give her stress.      Initial  Review   Source of Stress Concerns Chronic Illness           Vocational Rehabilitation: Provide vocational rehab assistance to qualifying candidates.   Vocational Rehab Evaluation & Intervention:   Education: Education Goals: Education classes will be provided on a variety of topics geared toward better understanding of heart health and risk factor modification. Participant will state understanding/return demonstration of topics presented as noted by education test scores.  Learning Barriers/Preferences:  Learning Barriers/Preferences - 03/11/20 1040      Learning Barriers/Preferences   Learning Barriers Sight    Learning Preferences None           General Cardiac Education Topics:  AED/CPR: - Group verbal and written instruction with the use of models to demonstrate the basic use of the AED with the basic ABC's of resuscitation.  Pulmonary Rehab from 07/19/2018 in [facility] Cardiac and Pulmonary Rehab  Date 07/19/18  Educator [facility]  Instruction Review Code 1- Verbalizes Understanding      Anatomy & Physiology of the Heart: - Group verbal and written instruction and models provide basic cardiac anatomy and physiology, with the coronary electrical and arterial systems. Review of Valvular disease and Heart Failure   Pulmonary Rehab from 07/19/2018 in [facility] Cardiac and Pulmonary Rehab  Date 06/21/18  Educator [facility]  Instruction Review Code 1- Verbalizes Understanding      Cardiac Procedures: - Group verbal and written instruction to review commonly prescribed medications for heart disease. Reviews the medication, class of the drug, and side effects. Includes the steps to properly store meds and maintain the prescription regimen. (beta blockers and nitrates)   Cardiac Medications I: - Group verbal and written instruction to review commonly prescribed medications for heart disease. Reviews the medication, class of the drug, and side effects. Includes the steps to properly store meds  and maintain the prescription regimen.   Pulmonary Rehab from 06/03/2020 in [facility] Cardiac and Pulmonary Rehab  Date 04/08/20  Educator SB  Instruction Review Code 1- Verbalizes Understanding      Cardiac Medications II: -Group verbal and written instruction to review commonly prescribed medications for heart disease. Reviews the medication, class of the drug, and side effects. (all other drug classes)   Pulmonary Rehab from 06/03/2020 in [facility] Cardiac and Pulmonary Rehab  Date 06/03/20  Educator SB  Instruction Review Code 1- Verbalizes Understanding       Go Sex-Intimacy & Heart Disease, Get SMART - Goal Setting: - Group verbal and written instruction through game format to discuss heart disease and the return to sexual intimacy. Provides group verbal and written material to discuss and apply goal setting through the application of the S.M.A.R.T. Method.   Other Matters of the Heart: - Provides group verbal, written materials and models to describe Stable Angina and Peripheral Artery. Includes description of the disease process and treatment options available to the cardiac patient.   Infection Prevention: - Provides verbal and written material to individual with discussion of infection control including proper hand washing and proper equipment cleaning during exercise session.   Pulmonary Rehab from 06/03/2020 in [facility] Cardiac and Pulmonary Rehab  Date 03/11/20  Educator [facility]  Instruction Review Code 1- Verbalizes Understanding      Falls Prevention: - Provides verbal and written material to individual with discussion of falls prevention and safety.   Pulmonary Rehab from 06/03/2020 in [facility] Cardiac and Pulmonary Rehab  Date 03/11/20  Educator [facility]  Instruction Review Code 1- Verbalizes Understanding      Other: -Provides group and verbal instruction on various topics (see comments)   Knowledge Questionnaire Score:  Knowledge Questionnaire Score - 03/18/20 1055      Knowledge  Questionnaire Score   Pre Score 16/18 Education: O2 Safety           Core Components/Risk Factors/Patient Goals at Admission:  Personal Goals and Risk Factors at Admission - 03/18/20 1055      Core Components/Risk Factors/Patient Goals on Admission    Weight Management Yes;Weight Loss;Obesity    Intervention Weight Management: Develop a combined nutrition and exercise program designed to reach desired caloric intake, while maintaining appropriate intake of nutrient and fiber, sodium and fats, and appropriate energy expenditure required for the weight goal.;Weight Management: Provide education and appropriate resources to help participant work on and attain dietary goals.;Weight Management/Obesity: Establish reasonable short term  and long term weight goals.;Obesity: Provide education and appropriate resources to help participant work on and attain dietary goals.    Admit Weight 203 lb 14.4 oz (92.5 kg)    Goal Weight: Short Term 198 lb (89.8 kg)    Goal Weight: Long Term 190 lb (86.2 kg)    Expected Outcomes Short Term: Continue to assess and modify interventions until short term weight is achieved;Long Term: Adherence to nutrition and physical activity/exercise program aimed toward attainment of established weight goal;Weight Loss: Understanding of general recommendations for a balanced deficit meal plan, which promotes 1-2 lb weight loss per week and includes a negative energy balance of [phone] kcal/d;Understanding recommendations for meals to include 15-35% energy as protein, 25-35% energy from fat, 35-60% energy from carbohydrates, less than 225m of dietary cholesterol, 20-35 gm of total fiber daily;Understanding of distribution of calorie intake throughout the day with the consumption of 4-5 meals/snacks    Improve shortness of breath with ADL's Yes    Intervention Provide education, individualized exercise plan and daily activity instruction to help decrease symptoms of SOB with activities  of daily living.    Expected Outcomes Short Term: Improve cardiorespiratory fitness to achieve a reduction of symptoms when performing ADLs;Long Term: Be able to perform more ADLs without symptoms or delay the onset of symptoms    Diabetes Yes    Intervention Provide education about signs/symptoms and action to take for hypo/hyperglycemia.;Provide education about proper nutrition, including hydration, and aerobic/resistive exercise prescription along with prescribed medications to achieve blood glucose in normal ranges: Fasting glucose 65-99 mg/dL    Expected Outcomes Long Term: Attainment of HbA1C < 7%.;Short Term: Participant verbalizes understanding of the signs/symptoms and immediate care of hyper/hypoglycemia, proper foot care and importance of medication, aerobic/resistive exercise and nutrition plan for blood glucose control.    Hypertension Yes    Intervention Provide education on lifestyle modifcations including regular physical activity/exercise, weight management, moderate sodium restriction and increased consumption of fresh fruit, vegetables, and low fat dairy, alcohol moderation, and smoking cessation.;Monitor prescription use compliance.    Expected Outcomes Short Term: Continued assessment and intervention until BP is < 140/960mHG in hypertensive participants. < 130/8051mG in hypertensive participants with diabetes, heart failure or chronic kidney disease.;Long Term: Maintenance of blood pressure at goal levels.    Lipids Yes    Intervention Provide education and support for participant on nutrition & aerobic/resistive exercise along with prescribed medications to achieve LDL <40m44mDL >40mg34m Expected Outcomes Short Term: Participant states understanding of desired cholesterol values and is compliant with medications prescribed. Participant is following exercise prescription and nutrition guidelines.;Long Term: Cholesterol controlled with medications as prescribed, with individualized  exercise RX and with personalized nutrition plan. Value goals: LDL < 40mg,34m > 40 mg.           Education:Diabetes - Individual verbal and written instruction to review signs/symptoms of diabetes, desired ranges of glucose level fasting, after meals and with exercise. Acknowledge that pre and post exercise glucose checks will be done for 3 sessions at entry of program.   [facility]  Date 03/11/20  [facility] Verbalizes Understanding      Education: Know Your Numbers and Risk Factors: -Group verbal and written instruction about important numbers in your health.  Discussion of what are risk factors and how they play a role in the disease process.  Review of Cholesterol, Blood Pressure,  Diabetes, and BMI and the role they play in your overall health.   Pulmonary Rehab from 06/03/2020 in [facility] Cardiac and Pulmonary Rehab  Date 06/03/20  Educator SB  Instruction Review Code 1- Verbalizes Understanding      Core Components/Risk Factors/Patient Goals Review:   Goals and Risk Factor Review    [place] Name 04/06/20 0940 05/11/20 0929 06/03/20 0935         Core Components/Risk Factors/Patient Goals Review   Personal Goals Review Weight Management/Obesity;Improve shortness of breath with ADL's;Diabetes;Lipids Weight Management/Obesity;Improve shortness of breath with ADL's;Diabetes;Lipids Weight Management/Obesity;Improve shortness of breath with ADL's;Diabetes;Lipids     Review Susan Callahan is doing well in rehab.  Her weight has been fluctuating and she is frustrated that she can't do all that she wants to do. Her blood sugars have been doing well and so have her pressures. Susan Callahan is still monitoring BP and BG at home.  She had first infusion 3 weeks ago and has next one tomorrow.  She may have had an allergic reaction to the treatment and she will follow up with Dr about that. Susan Callahan is still monitoring BG at home. Bg has  been normal and her appetite is coming back. Stopped taking BP at home, encouraged to do so at least 1x/day. She continues with her infusions which has been wiping her out, she now has a port.  She has not been seeing anymore allergic reaction symptoms. She is stressed as well due to new blood clot in her neck, started Eliquis to help.     Expected Outcomes Short: Continue to work on her weight Long: Continue to manage lung disease. Short:  continue to monitor BG and BP Long:  manage lung disease and risk factors Short:  continue to monitor BG and BP Long:  manage lung disease and risk factors            Core Components/Risk Factors/Patient Goals at Discharge (Final Review):   Goals and Risk Factor Review - 06/03/20 0935      Core Components/Risk Factors/Patient Goals Review   Personal Goals Review Weight Management/Obesity;Improve shortness of breath with ADL's;Diabetes;Lipids    Review Susan Callahan is still monitoring BG at home. Bg has been normal and her appetite is coming back. Stopped taking BP at home, encouraged to do so at least 1x/day. She continues with her infusions which has been wiping her out, she now has a port.  She has not been seeing anymore allergic reaction symptoms. She is stressed as well due to new blood clot in her neck, started Eliquis to help.    Expected Outcomes Short:  continue to monitor BG and BP Long:  manage lung disease and risk factors           ITP Comments:  ITP Comments    Row Name 03/11/20 1045 03/18/20 1023 03/24/20 0634 04/20/20 1521 04/21/20 0543   ITP Comments Virtual Visit completed. Patient informed on EP and RD appointment and 6 Minute walk test. Patient also informed of patient health questionnaires on My Chart. Patient Verbalizes understanding. Visit diagnosis can be found in [facility] 01/07/2020. Completed 6MWT and gym orientation. Initial ITP created and sent for review to Dr. Emily Filbert, Medical Director. 30 Day review completed. Medical Director ITP  review done, changes made as directed, and signed approval by Medical Director. Susan Callahan called out today, she started her cancer treatment infusions yesterday. 30 Day review completed. Medical Director ITP review done, changes made as directed, and signed approval by Medical Director.  Susan Callahan [place] Name 04/22/20 0857 05/19/20 1157 06/10/20 0922 06/15/20 1511 06/16/20 0514   ITP Comments Pt called to report that she was still feeling fatigued but better. She had spent the day at [facility] yesterday which did not help.  She is going to come to her lymphedema appt today and try for rehab next week. 30 day review completed. ITP sent to Dr. Emily Filbert, Medical Director of Cardiac and Pulmonary Rehab. Continue with ITP unless changes are made by physician. Susan Callahan has ben admitted to [facility] with port infection. Remains in hospital as of 9/30 Still admitted to ICU at [facility]. 30 Day review completed. Medical Director ITP review done, changes made as directed, and signed approval by Medical Director.   [place] Name 06/22/20 0752 06/28/20 1017         ITP Comments Pt is still currently admitted at [facility] with infection from port causing MRSA pneumonia.  We will place her on medical hold for now. Pt is deceased            Comments: Discharge ITP

## 2020-07-01 ENCOUNTER — Encounter: Payer: Medicare Other | Admitting: Occupational Therapy

## 2020-07-08 ENCOUNTER — Encounter: Payer: Medicare Other | Admitting: Occupational Therapy

## 2020-07-12 DEATH — deceased

## 2020-07-15 ENCOUNTER — Encounter: Payer: Medicare Other | Admitting: Occupational Therapy

## 2020-07-22 ENCOUNTER — Encounter: Payer: Medicare Other | Admitting: Occupational Therapy

## 2020-07-29 ENCOUNTER — Encounter: Payer: Medicare Other | Admitting: Occupational Therapy

## 2020-08-12 ENCOUNTER — Encounter: Payer: Medicare Other | Admitting: Occupational Therapy

## 2020-08-19 ENCOUNTER — Encounter: Payer: Medicare Other | Admitting: Occupational Therapy

## 2020-08-26 ENCOUNTER — Encounter: Payer: Medicare Other | Admitting: Occupational Therapy

## 2020-09-02 ENCOUNTER — Encounter: Payer: Medicare Other | Admitting: Occupational Therapy

## 2020-09-09 ENCOUNTER — Encounter: Payer: Medicare Other | Admitting: Occupational Therapy
